# Patient Record
Sex: Female | Born: 1955 | Race: Black or African American | Hispanic: No | Marital: Married | Smoking: Former smoker
Health system: Southern US, Community
[De-identification: ages and names within clinical notes are randomized; demographics above are authoritative.]

## PROBLEM LIST (undated history)

## (undated) DIAGNOSIS — F4321 Adjustment disorder with depressed mood: Secondary | ICD-10-CM

## (undated) DIAGNOSIS — T8484XA Pain due to internal orthopedic prosthetic devices, implants and grafts, initial encounter: Secondary | ICD-10-CM

## (undated) DIAGNOSIS — J309 Allergic rhinitis, unspecified: Secondary | ICD-10-CM

## (undated) DIAGNOSIS — I10 Essential (primary) hypertension: Secondary | ICD-10-CM

## (undated) DIAGNOSIS — Z87442 Personal history of urinary calculi: Secondary | ICD-10-CM

## (undated) DIAGNOSIS — J189 Pneumonia, unspecified organism: Secondary | ICD-10-CM

## (undated) DIAGNOSIS — E785 Hyperlipidemia, unspecified: Secondary | ICD-10-CM

## (undated) DIAGNOSIS — M1990 Unspecified osteoarthritis, unspecified site: Secondary | ICD-10-CM

## (undated) DIAGNOSIS — R51 Headache: Secondary | ICD-10-CM

## (undated) DIAGNOSIS — K219 Gastro-esophageal reflux disease without esophagitis: Secondary | ICD-10-CM

## (undated) DIAGNOSIS — G4700 Insomnia, unspecified: Secondary | ICD-10-CM

## (undated) DIAGNOSIS — F419 Anxiety disorder, unspecified: Secondary | ICD-10-CM

## (undated) DIAGNOSIS — Z96659 Presence of unspecified artificial knee joint: Secondary | ICD-10-CM

## (undated) DIAGNOSIS — R519 Headache, unspecified: Secondary | ICD-10-CM

## (undated) HISTORY — DX: Allergic rhinitis, unspecified: J30.9

## (undated) HISTORY — DX: Personal history of urinary calculi: Z87.442

## (undated) HISTORY — DX: Insomnia, unspecified: G47.00

## (undated) HISTORY — DX: Presence of unspecified artificial knee joint: Z96.659

## (undated) HISTORY — PX: KNEE ARTHROSCOPY: SUR90

## (undated) HISTORY — DX: Essential (primary) hypertension: I10

## (undated) HISTORY — PX: CERVICAL DISC SURGERY: SHX588

## (undated) HISTORY — DX: Hyperlipidemia, unspecified: E78.5

## (undated) HISTORY — PX: OTHER SURGICAL HISTORY: SHX169

## (undated) HISTORY — DX: Adjustment disorder with depressed mood: F43.21

## (undated) HISTORY — DX: Pain due to internal orthopedic prosthetic devices, implants and grafts, initial encounter: T84.84XA

---

## 1998-07-22 ENCOUNTER — Other Ambulatory Visit: Admission: RE | Admit: 1998-07-22 | Discharge: 1998-07-22 | Payer: Self-pay | Admitting: Obstetrics and Gynecology

## 1999-08-30 ENCOUNTER — Ambulatory Visit (HOSPITAL_COMMUNITY): Admission: RE | Admit: 1999-08-30 | Discharge: 1999-08-30 | Payer: Self-pay | Admitting: Family Medicine

## 1999-08-30 ENCOUNTER — Encounter: Payer: Self-pay | Admitting: Family Medicine

## 2000-07-16 ENCOUNTER — Encounter: Payer: Self-pay | Admitting: Emergency Medicine

## 2000-07-16 ENCOUNTER — Emergency Department (HOSPITAL_COMMUNITY): Admission: EM | Admit: 2000-07-16 | Discharge: 2000-07-16 | Payer: Self-pay | Admitting: Emergency Medicine

## 2000-07-19 ENCOUNTER — Encounter: Admission: RE | Admit: 2000-07-19 | Discharge: 2000-07-19 | Payer: Self-pay | Admitting: *Deleted

## 2000-07-19 ENCOUNTER — Encounter: Payer: Self-pay | Admitting: *Deleted

## 2000-07-20 ENCOUNTER — Encounter: Admission: RE | Admit: 2000-07-20 | Discharge: 2000-07-20 | Payer: Self-pay | Admitting: Orthopedic Surgery

## 2000-07-20 ENCOUNTER — Encounter: Payer: Self-pay | Admitting: Orthopedic Surgery

## 2000-08-09 ENCOUNTER — Encounter (INDEPENDENT_AMBULATORY_CARE_PROVIDER_SITE_OTHER): Payer: Self-pay

## 2000-08-09 ENCOUNTER — Other Ambulatory Visit: Admission: RE | Admit: 2000-08-09 | Discharge: 2000-08-09 | Payer: Self-pay | Admitting: *Deleted

## 2000-08-31 ENCOUNTER — Other Ambulatory Visit: Admission: RE | Admit: 2000-08-31 | Discharge: 2000-08-31 | Payer: Self-pay | Admitting: *Deleted

## 2000-08-31 ENCOUNTER — Inpatient Hospital Stay (HOSPITAL_COMMUNITY): Admission: EM | Admit: 2000-08-31 | Discharge: 2000-09-03 | Payer: Self-pay | Admitting: Emergency Medicine

## 2000-08-31 ENCOUNTER — Encounter: Payer: Self-pay | Admitting: Emergency Medicine

## 2000-09-17 ENCOUNTER — Other Ambulatory Visit: Admission: RE | Admit: 2000-09-17 | Discharge: 2000-09-17 | Payer: Self-pay | Admitting: *Deleted

## 2000-09-17 ENCOUNTER — Encounter (INDEPENDENT_AMBULATORY_CARE_PROVIDER_SITE_OTHER): Payer: Self-pay | Admitting: Specialist

## 2002-01-01 ENCOUNTER — Encounter: Payer: Self-pay | Admitting: *Deleted

## 2002-01-01 ENCOUNTER — Ambulatory Visit (HOSPITAL_COMMUNITY): Admission: RE | Admit: 2002-01-01 | Discharge: 2002-01-01 | Payer: Self-pay | Admitting: *Deleted

## 2002-02-27 ENCOUNTER — Encounter: Payer: Self-pay | Admitting: Neurosurgery

## 2002-02-27 ENCOUNTER — Ambulatory Visit (HOSPITAL_COMMUNITY): Admission: RE | Admit: 2002-02-27 | Discharge: 2002-02-28 | Payer: Self-pay | Admitting: Neurosurgery

## 2002-03-31 ENCOUNTER — Encounter: Payer: Self-pay | Admitting: Neurosurgery

## 2002-03-31 ENCOUNTER — Encounter: Admission: RE | Admit: 2002-03-31 | Discharge: 2002-03-31 | Payer: Self-pay | Admitting: Neurosurgery

## 2002-04-14 ENCOUNTER — Encounter: Payer: Self-pay | Admitting: Neurosurgery

## 2002-04-14 ENCOUNTER — Encounter: Admission: RE | Admit: 2002-04-14 | Discharge: 2002-04-14 | Payer: Self-pay | Admitting: Neurosurgery

## 2002-05-07 ENCOUNTER — Encounter: Admission: RE | Admit: 2002-05-07 | Discharge: 2002-05-07 | Payer: Self-pay | Admitting: Internal Medicine

## 2002-05-07 ENCOUNTER — Encounter: Payer: Self-pay | Admitting: Internal Medicine

## 2002-05-12 ENCOUNTER — Encounter: Payer: Self-pay | Admitting: Neurosurgery

## 2002-05-12 ENCOUNTER — Encounter: Admission: RE | Admit: 2002-05-12 | Discharge: 2002-05-12 | Payer: Self-pay | Admitting: Neurosurgery

## 2002-08-04 ENCOUNTER — Encounter: Admission: RE | Admit: 2002-08-04 | Discharge: 2002-08-04 | Payer: Self-pay | Admitting: Neurosurgery

## 2002-08-04 ENCOUNTER — Encounter: Payer: Self-pay | Admitting: Neurosurgery

## 2004-01-08 ENCOUNTER — Ambulatory Visit (HOSPITAL_BASED_OUTPATIENT_CLINIC_OR_DEPARTMENT_OTHER): Admission: RE | Admit: 2004-01-08 | Discharge: 2004-01-08 | Payer: Self-pay | Admitting: Internal Medicine

## 2004-01-08 ENCOUNTER — Ambulatory Visit: Payer: Self-pay | Admitting: Pulmonary Disease

## 2004-12-02 ENCOUNTER — Ambulatory Visit: Payer: Self-pay | Admitting: Internal Medicine

## 2004-12-07 ENCOUNTER — Ambulatory Visit: Payer: Self-pay

## 2004-12-16 ENCOUNTER — Ambulatory Visit: Payer: Self-pay

## 2005-02-04 ENCOUNTER — Emergency Department (HOSPITAL_COMMUNITY): Admission: EM | Admit: 2005-02-04 | Discharge: 2005-02-04 | Payer: Self-pay | Admitting: Emergency Medicine

## 2005-02-16 ENCOUNTER — Ambulatory Visit: Payer: Self-pay | Admitting: Internal Medicine

## 2005-03-08 ENCOUNTER — Encounter: Admission: RE | Admit: 2005-03-08 | Discharge: 2005-03-08 | Payer: Self-pay | Admitting: Occupational Medicine

## 2005-03-22 ENCOUNTER — Encounter: Admission: RE | Admit: 2005-03-22 | Discharge: 2005-04-03 | Payer: Self-pay | Admitting: Occupational Medicine

## 2005-04-10 ENCOUNTER — Ambulatory Visit: Payer: Self-pay | Admitting: Internal Medicine

## 2005-05-21 ENCOUNTER — Ambulatory Visit (HOSPITAL_COMMUNITY): Admission: RE | Admit: 2005-05-21 | Discharge: 2005-05-21 | Payer: Self-pay | Admitting: Neurosurgery

## 2005-06-19 ENCOUNTER — Ambulatory Visit (HOSPITAL_COMMUNITY): Admission: RE | Admit: 2005-06-19 | Discharge: 2005-06-20 | Payer: Self-pay | Admitting: Neurosurgery

## 2006-01-24 ENCOUNTER — Ambulatory Visit: Payer: Self-pay | Admitting: Internal Medicine

## 2006-02-02 ENCOUNTER — Encounter: Admission: RE | Admit: 2006-02-02 | Discharge: 2006-02-02 | Payer: Self-pay | Admitting: Internal Medicine

## 2006-11-09 ENCOUNTER — Ambulatory Visit: Payer: Self-pay | Admitting: Internal Medicine

## 2006-11-24 ENCOUNTER — Emergency Department (HOSPITAL_COMMUNITY): Admission: EM | Admit: 2006-11-24 | Discharge: 2006-11-24 | Payer: Self-pay | Admitting: Emergency Medicine

## 2006-12-18 ENCOUNTER — Encounter: Payer: Self-pay | Admitting: *Deleted

## 2006-12-18 DIAGNOSIS — Z87442 Personal history of urinary calculi: Secondary | ICD-10-CM

## 2006-12-18 DIAGNOSIS — E785 Hyperlipidemia, unspecified: Secondary | ICD-10-CM

## 2006-12-18 DIAGNOSIS — I10 Essential (primary) hypertension: Secondary | ICD-10-CM

## 2006-12-18 DIAGNOSIS — J309 Allergic rhinitis, unspecified: Secondary | ICD-10-CM | POA: Insufficient documentation

## 2006-12-18 HISTORY — DX: Hyperlipidemia, unspecified: E78.5

## 2006-12-18 HISTORY — DX: Allergic rhinitis, unspecified: J30.9

## 2006-12-18 HISTORY — DX: Personal history of urinary calculi: Z87.442

## 2006-12-18 HISTORY — DX: Essential (primary) hypertension: I10

## 2006-12-19 ENCOUNTER — Ambulatory Visit: Payer: Self-pay | Admitting: Internal Medicine

## 2006-12-19 LAB — CONVERTED CEMR LAB
AST: 19 units/L (ref 0–37)
Albumin: 3.8 g/dL (ref 3.5–5.2)
Alkaline Phosphatase: 56 units/L (ref 39–117)
Basophils Absolute: 0.1 10*3/uL (ref 0.0–0.1)
Basophils Relative: 0.7 % (ref 0.0–1.0)
Bilirubin Urine: NEGATIVE
Bilirubin, Direct: 0.1 mg/dL (ref 0.0–0.3)
CO2: 28 meq/L (ref 19–32)
Chloride: 106 meq/L (ref 96–112)
Cholesterol: 210 mg/dL (ref 0–200)
Creatinine, Ser: 0.7 mg/dL (ref 0.4–1.2)
Crystals: NEGATIVE
Direct LDL: 133.1 mg/dL
Eosinophils Relative: 3.2 % (ref 0.0–5.0)
Glucose, Bld: 101 mg/dL — ABNORMAL HIGH (ref 70–99)
HDL: 57.1 mg/dL (ref >39.0)
Ketones, ur: NEGATIVE mg/dL
Lymphocytes Relative: 18.1 % (ref 12.0–46.0)
MCHC: 33.4 g/dL (ref 30.0–36.0)
MCV: 82.7 fL (ref 78.0–100.0)
Neutro Abs: 5.2 10*3/uL (ref 1.4–7.7)
Neutrophils Relative %: 70.5 % (ref 43.0–77.0)
Platelets: 225 10*3/uL (ref 150–400)
RBC: 4.23 M/uL (ref 3.87–5.11)
RDW: 11.9 % (ref 11.5–14.6)
Total Bilirubin: 0.5 mg/dL (ref 0.3–1.2)
Total CHOL/HDL Ratio: 3.7
Total Protein: 7.3 g/dL (ref 6.0–8.3)
Urine Glucose: NEGATIVE mg/dL
VLDL: 19 mg/dL (ref 0–40)
pH: 6 (ref 5.0–8.0)

## 2007-01-29 ENCOUNTER — Emergency Department (HOSPITAL_COMMUNITY): Admission: EM | Admit: 2007-01-29 | Discharge: 2007-01-29 | Payer: Self-pay | Admitting: Emergency Medicine

## 2007-01-30 ENCOUNTER — Ambulatory Visit: Payer: Self-pay | Admitting: Gastroenterology

## 2007-02-04 ENCOUNTER — Telehealth: Payer: Self-pay | Admitting: Internal Medicine

## 2007-02-11 ENCOUNTER — Ambulatory Visit: Payer: Self-pay | Admitting: Internal Medicine

## 2007-02-11 DIAGNOSIS — J40 Bronchitis, not specified as acute or chronic: Secondary | ICD-10-CM

## 2007-02-13 ENCOUNTER — Encounter: Payer: Self-pay | Admitting: Internal Medicine

## 2007-02-13 ENCOUNTER — Encounter: Payer: Self-pay | Admitting: Gastroenterology

## 2007-02-13 ENCOUNTER — Ambulatory Visit: Payer: Self-pay | Admitting: Gastroenterology

## 2007-02-18 ENCOUNTER — Telehealth: Payer: Self-pay | Admitting: Internal Medicine

## 2007-03-15 ENCOUNTER — Ambulatory Visit: Payer: Self-pay | Admitting: Internal Medicine

## 2007-03-15 DIAGNOSIS — F4321 Adjustment disorder with depressed mood: Secondary | ICD-10-CM

## 2007-03-15 DIAGNOSIS — F329 Major depressive disorder, single episode, unspecified: Secondary | ICD-10-CM

## 2007-03-15 HISTORY — DX: Adjustment disorder with depressed mood: F43.21

## 2007-03-27 ENCOUNTER — Telehealth: Payer: Self-pay | Admitting: Internal Medicine

## 2007-04-01 ENCOUNTER — Ambulatory Visit: Payer: Self-pay | Admitting: Internal Medicine

## 2007-07-30 ENCOUNTER — Encounter: Payer: Self-pay | Admitting: Internal Medicine

## 2007-08-14 ENCOUNTER — Ambulatory Visit: Payer: Self-pay | Admitting: Internal Medicine

## 2007-08-14 DIAGNOSIS — M542 Cervicalgia: Secondary | ICD-10-CM

## 2007-09-27 ENCOUNTER — Ambulatory Visit: Payer: Self-pay | Admitting: Internal Medicine

## 2007-09-27 DIAGNOSIS — R519 Headache, unspecified: Secondary | ICD-10-CM | POA: Insufficient documentation

## 2007-09-27 DIAGNOSIS — R51 Headache: Secondary | ICD-10-CM

## 2007-09-27 DIAGNOSIS — J019 Acute sinusitis, unspecified: Secondary | ICD-10-CM | POA: Insufficient documentation

## 2007-09-27 DIAGNOSIS — E860 Dehydration: Secondary | ICD-10-CM

## 2007-09-30 ENCOUNTER — Telehealth (INDEPENDENT_AMBULATORY_CARE_PROVIDER_SITE_OTHER): Payer: Self-pay | Admitting: *Deleted

## 2007-10-18 ENCOUNTER — Telehealth: Payer: Self-pay | Admitting: Internal Medicine

## 2007-12-05 ENCOUNTER — Telehealth (INDEPENDENT_AMBULATORY_CARE_PROVIDER_SITE_OTHER): Payer: Self-pay | Admitting: *Deleted

## 2008-01-20 ENCOUNTER — Telehealth (INDEPENDENT_AMBULATORY_CARE_PROVIDER_SITE_OTHER): Payer: Self-pay | Admitting: *Deleted

## 2008-01-21 ENCOUNTER — Ambulatory Visit: Payer: Self-pay | Admitting: Internal Medicine

## 2008-02-20 ENCOUNTER — Telehealth (INDEPENDENT_AMBULATORY_CARE_PROVIDER_SITE_OTHER): Payer: Self-pay | Admitting: *Deleted

## 2008-04-23 ENCOUNTER — Telehealth (INDEPENDENT_AMBULATORY_CARE_PROVIDER_SITE_OTHER): Payer: Self-pay | Admitting: *Deleted

## 2008-05-12 ENCOUNTER — Telehealth (INDEPENDENT_AMBULATORY_CARE_PROVIDER_SITE_OTHER): Payer: Self-pay | Admitting: *Deleted

## 2008-06-01 ENCOUNTER — Telehealth (INDEPENDENT_AMBULATORY_CARE_PROVIDER_SITE_OTHER): Payer: Self-pay | Admitting: *Deleted

## 2008-06-15 ENCOUNTER — Telehealth (INDEPENDENT_AMBULATORY_CARE_PROVIDER_SITE_OTHER): Payer: Self-pay | Admitting: *Deleted

## 2008-06-29 ENCOUNTER — Telehealth: Payer: Self-pay | Admitting: Internal Medicine

## 2008-06-30 ENCOUNTER — Inpatient Hospital Stay (HOSPITAL_COMMUNITY): Admission: RE | Admit: 2008-06-30 | Discharge: 2008-07-04 | Payer: Self-pay | Admitting: Orthopedic Surgery

## 2008-07-06 ENCOUNTER — Telehealth: Payer: Self-pay | Admitting: Internal Medicine

## 2008-07-10 ENCOUNTER — Telehealth (INDEPENDENT_AMBULATORY_CARE_PROVIDER_SITE_OTHER): Payer: Self-pay | Admitting: *Deleted

## 2008-07-30 ENCOUNTER — Ambulatory Visit: Payer: Self-pay | Admitting: Internal Medicine

## 2008-08-20 ENCOUNTER — Encounter: Admission: RE | Admit: 2008-08-20 | Discharge: 2008-08-20 | Payer: Self-pay | Admitting: Anesthesiology

## 2008-08-20 ENCOUNTER — Emergency Department (HOSPITAL_COMMUNITY): Admission: EM | Admit: 2008-08-20 | Discharge: 2008-08-20 | Payer: Self-pay | Admitting: Emergency Medicine

## 2008-12-17 ENCOUNTER — Encounter
Admission: RE | Admit: 2008-12-17 | Discharge: 2009-02-03 | Payer: Self-pay | Admitting: Physical Medicine & Rehabilitation

## 2008-12-21 ENCOUNTER — Ambulatory Visit: Payer: Self-pay | Admitting: Physical Medicine & Rehabilitation

## 2009-01-04 ENCOUNTER — Ambulatory Visit: Payer: Self-pay | Admitting: Physical Medicine & Rehabilitation

## 2009-01-18 ENCOUNTER — Ambulatory Visit: Payer: Self-pay | Admitting: Physical Medicine & Rehabilitation

## 2009-02-02 ENCOUNTER — Ambulatory Visit: Payer: Self-pay | Admitting: Physical Medicine & Rehabilitation

## 2009-04-05 ENCOUNTER — Encounter
Admission: RE | Admit: 2009-04-05 | Discharge: 2009-06-07 | Payer: Self-pay | Admitting: Physical Medicine & Rehabilitation

## 2009-04-06 ENCOUNTER — Ambulatory Visit: Payer: Self-pay | Admitting: Physical Medicine & Rehabilitation

## 2009-05-07 ENCOUNTER — Ambulatory Visit: Payer: Self-pay | Admitting: Physical Medicine & Rehabilitation

## 2009-06-07 ENCOUNTER — Ambulatory Visit: Payer: Self-pay | Admitting: Physical Medicine & Rehabilitation

## 2009-08-25 ENCOUNTER — Ambulatory Visit: Payer: Self-pay | Admitting: Internal Medicine

## 2009-08-25 DIAGNOSIS — R5383 Other fatigue: Secondary | ICD-10-CM

## 2009-08-25 DIAGNOSIS — M76899 Other specified enthesopathies of unspecified lower limb, excluding foot: Secondary | ICD-10-CM | POA: Insufficient documentation

## 2009-08-25 DIAGNOSIS — G4700 Insomnia, unspecified: Secondary | ICD-10-CM | POA: Insufficient documentation

## 2009-08-25 DIAGNOSIS — R5381 Other malaise: Secondary | ICD-10-CM | POA: Insufficient documentation

## 2009-08-25 HISTORY — DX: Insomnia, unspecified: G47.00

## 2009-08-25 LAB — CONVERTED CEMR LAB
BUN: 19 mg/dL (ref 6–23)
Basophils Absolute: 0 10*3/uL (ref 0.0–0.1)
CO2: 34 meq/L — ABNORMAL HIGH (ref 19–32)
Calcium: 10.4 mg/dL (ref 8.4–10.5)
Glucose, Bld: 110 mg/dL — ABNORMAL HIGH (ref 70–99)
HCT: 38.5 % (ref 36.0–46.0)
Hemoglobin: 12.9 g/dL (ref 12.0–15.0)
Lymphs Abs: 1.6 10*3/uL (ref 0.7–4.0)
MCHC: 33.6 g/dL (ref 30.0–36.0)
MCV: 82.5 fL (ref 78.0–100.0)
Monocytes Absolute: 0.5 10*3/uL (ref 0.1–1.0)
Monocytes Relative: 6.7 % (ref 3.0–12.0)
Neutrophils Relative %: 67.4 % (ref 43.0–77.0)
RDW: 12.9 % (ref 11.5–14.6)
Sodium: 143 meq/L (ref 135–145)

## 2009-09-13 ENCOUNTER — Emergency Department (HOSPITAL_COMMUNITY): Admission: EM | Admit: 2009-09-13 | Discharge: 2009-09-14 | Payer: Self-pay | Admitting: Emergency Medicine

## 2009-09-21 ENCOUNTER — Telehealth: Payer: Self-pay | Admitting: Internal Medicine

## 2009-09-22 ENCOUNTER — Ambulatory Visit: Payer: Self-pay | Admitting: Internal Medicine

## 2009-09-22 DIAGNOSIS — R197 Diarrhea, unspecified: Secondary | ICD-10-CM | POA: Insufficient documentation

## 2009-09-22 DIAGNOSIS — R1084 Generalized abdominal pain: Secondary | ICD-10-CM | POA: Insufficient documentation

## 2009-09-22 LAB — CONVERTED CEMR LAB
Basophils Absolute: 0.1 10*3/uL (ref 0.0–0.1)
Basophils Relative: 0.8 % (ref 0.0–3.0)
CO2: 29 meq/L (ref 19–32)
Creatinine, Ser: 1.2 mg/dL (ref 0.4–1.2)
Eosinophils Absolute: 1.3 10*3/uL — ABNORMAL HIGH (ref 0.0–0.7)
HCT: 36.2 % (ref 36.0–46.0)
Hemoglobin: 12.1 g/dL (ref 12.0–15.0)
Lymphs Abs: 1.4 10*3/uL (ref 0.7–4.0)
Monocytes Absolute: 0.4 10*3/uL (ref 0.1–1.0)
Monocytes Relative: 6.3 % (ref 3.0–12.0)
Potassium: 3.9 meq/L (ref 3.5–5.1)
RDW: 12.6 % (ref 11.5–14.6)
Sodium: 140 meq/L (ref 135–145)

## 2009-09-27 ENCOUNTER — Encounter: Payer: Self-pay | Admitting: Internal Medicine

## 2010-01-04 ENCOUNTER — Telehealth (INDEPENDENT_AMBULATORY_CARE_PROVIDER_SITE_OTHER): Payer: Self-pay | Admitting: *Deleted

## 2010-01-26 ENCOUNTER — Telehealth (INDEPENDENT_AMBULATORY_CARE_PROVIDER_SITE_OTHER): Payer: Self-pay | Admitting: *Deleted

## 2010-04-03 ENCOUNTER — Encounter: Payer: Self-pay | Admitting: Internal Medicine

## 2010-04-03 ENCOUNTER — Encounter: Payer: Self-pay | Admitting: *Deleted

## 2010-04-08 ENCOUNTER — Encounter: Payer: Self-pay | Admitting: Internal Medicine

## 2010-04-14 NOTE — Letter (Signed)
Summary: Out of Work  [facility]  [address] [place], [place] [address]   Phone: [phone]  Fax: [phone]    February 11, 2007   Employee:  Pamela [name]    To Whom It May Concern:   For Medical reasons, please excuse the above named employee from work for the following dates:  Start:    End:    If you need additional information, please feel free to contact our office.         Sincerely,    D Thomos Lemons DO

## 2010-04-14 NOTE — Assessment & Plan Note (Signed)
Summary: STOMACH PAIN/ PER TRIAGE /NWS   Vital Signs:  Patient profile:   55 year old female Height:      68 inches Weight:      229 pounds BMI:     34.95 O2 Sat:      98 % on Room air Temp:     99.1 degrees F oral Pulse rate:   90 / minute BP sitting:   118 / 72  (left arm) Cuff size:   large  Vitals Entered By: Zella Ball Ewing CMA (AAMA) (September 22, 2009 11:04 AM)  O2 Flow:  Room air CC: Stomach problems/RE   CC:  Stomach problems/RE.  History of Present Illness: here for f/u Er visit with abd pain, CT neg, tx with percocet and phenergan with routine labs o/w essentially neg (except slight elev BS , BP and possible midl anemia with hgb approx 11.5.  Since then symtpoms actually just thereafter with increaes pain, gas - like per pt; with increased nausea, vomiting several times, and mult loose stools/ watery and loose, some "dark" but no BRBPR.  Tagament and the percocet seemed to help but now out of meds.  Denies Pt denies CP, sob, doe, wheezing, orthopnea, pnd, worsening LE edema, palps, dizziness or syncope Pt denies new neuro symptoms such as headache, facial or extremity weakness Has had several chills and more foul smelling stool than usual.  Denies worsening polyuria, polydipsia, but has general weakness but no falls or injury.  No rash or joint pains, wt loss, night sweats, or other constitutional symptoms   Problems Prior to Update: 1)  Abdominal Pain, Generalized  (ICD-789.07) 2)  Diarrhea of Presumed Infectious Origin  (ICD-009.3) 3)  Bursitis, Right Hip  (ICD-726.5) 4)  Fatigue  (ICD-780.79) 5)  Insomnia-sleep Disorder-unspec  (ICD-780.52) 6)  Cervicalgia  (ICD-723.1) 7)  Headache  (ICD-784.0) 8)  Dehydration  (ICD-276.51) 9)  Sinusitis- Acute-nos  (ICD-461.9) 10)  Neck Pain  (ICD-723.1) 11)  Depression, Situational  (ICD-309.0) 12)  Bronchitis  (ICD-490) 13)  Cervical Discectomy  () 14)  Renal Calculus, Hx of  (ICD-V13.01) 15)  Hx of Hyperlipidemia  (ICD-272.4) 16)   Allergic Rhinitis  (ICD-477.9) 17)  Hypertension  (ICD-401.9) 18)  Family History of Cad Female 1st Degree Relative <50  (ICD-V17.3)  Medications Prior to Update: 1)  Lisinopril-Hydrochlorothiazide 20-12.5 Mg Tabs (Lisinopril-Hydrochlorothiazide) .Marland Kitchen.. 1po Once Daily 2)  Zyrtec Allergy 10 Mg  Tabs (Cetirizine Hcl) .... Take 1 Tablet By Mouth Once A Day 3)  Zolpidem Tartrate 12.5 Mg Cr-Tabs (Zolpidem Tartrate) .Marland Kitchen.. 1 By Mouth At Bedtime As Needed 4)  Cymbalta 60 Mg Cpep (Duloxetine Hcl) .Marland Kitchen.. 1 By Mouth Once Daily 5)  Flexeril 5 Mg Tabs (Cyclobenzaprine Hcl) .Marland Kitchen.. 1 By Mouth Three Times A Day As Needed Pain 6)  Fentanyl 50 Mcg/hr Pt72 (Fentanyl) .... Apply 1 Patch To Equal 75mg  Once Every 3 Days 7)  Fentanyl 25 Mcg/hr Pt72 (Fentanyl) .... Apply 1 Patch To Equal 75mg  Once Every 3 Days 8)  Vitamin D 34742 Unit Caps (Ergocalciferol) .Marland Kitchen.. 1 By Mouth Once Daily 9)  Prednisone 20 Mg Tabs (Prednisone) .Marland Kitchen.. 1po Once Daily For 7 Days  Current Medications (verified): 1)  Lisinopril-Hydrochlorothiazide 20-12.5 Mg Tabs (Lisinopril-Hydrochlorothiazide) .Marland Kitchen.. 1po Once Daily 2)  Zyrtec Allergy 10 Mg  Tabs (Cetirizine Hcl) .... Take 1 Tablet By Mouth Once A Day 3)  Zolpidem Tartrate 12.5 Mg Cr-Tabs (Zolpidem Tartrate) .Marland Kitchen.. 1 By Mouth At Bedtime As Needed 4)  Cymbalta 60 Mg Cpep (Duloxetine Hcl) .Marland KitchenMarland KitchenMarland Kitchen 1  By Mouth Once Daily 5)  Flexeril 5 Mg Tabs (Cyclobenzaprine Hcl) .Marland Kitchen.. 1 By Mouth Three Times A Day As Needed Pain 6)  Fentanyl 50 Mcg/hr Pt72 (Fentanyl) .... Apply 1 Patch To Equal 75mg  Once Every 3 Days 7)  Fentanyl 25 Mcg/hr Pt72 (Fentanyl) .... Apply 1 Patch To Equal 75mg  Once Every 3 Days 8)  Vitamin D 04540 Unit Caps (Ergocalciferol) .Marland Kitchen.. 1 By Mouth Once Daily 9)  Oxycodone Hcl 5 Mg Tabs (Oxycodone Hcl) .Marland Kitchen.. 1 - 2 By Mouth Q 6 Hrs As Needed Pain 10)  Promethazine Hcl 25 Mg Tabs (Promethazine Hcl) .Marland Kitchen.. 1po Q 6 Hrs As Needed Nausea  Allergies (verified): No Known Drug Allergies  Past History:  Past Medical  History: Last updated: 09/30/2007 History of kidney stones. 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 , mild ill  Head:  normocephalic and atraumatic.   Eyes:  vision grossly intact, pupils equal, and pupils round.   Ears:  bilat tm's mild red, sinus nontender Nose:  nasal dischargemucosal pallor and mucosal edema.   Mouth:  pharyngeal erythema and fair dentition.   Neck:  supple and no masses.   Lungs:  normal respiratory effort and normal breath sounds.   Heart:  normal rate and regular rhythm.   Abdomen:  soft and normal bowel sounds with diffuse mild tender, no guarding or rebound.   Extremities:  no edema, no erythema    Impression & Recommendations:  Problem # 1:  DIARRHEA OF PRESUMED INFECTIOUS ORIGIN (ICD-009.3)  ? viral vs c diff vs other - for labs and stool studies, refil percocet/phenergan  Orders: T-Culture, C-Diff Toxin A/B (98119-14782) T-Stool for O&P (95621-30865) T-Stool Giardia / Crypto- EIA (78469) T-Culture, Stool (87045/[id number])  Problem # 2:  ABDOMINAL PAIN, GENERALIZED (ICD-789.07)  recent ct neg;  most likely related to above  Orders: TLB-BMP (Basic Metabolic Panel-BMET) (80048-METABOL) TLB-CBC Platelet - w/Differential (85025-CBCD) TLB-Lipase (83690-LIPASE)  Problem # 3:  HYPERTENSION (ICD-401.9)  Her updated medication list for this problem includes:     Lisinopril-hydrochlorothiazide 20-12.5 Mg Tabs (Lisinopril-hydrochlorothiazide) .Marland Kitchen... 1po once daily  BP today: 118/72 Prior BP: 144/94 (08/25/2009)  Labs Reviewed: K+: 3.7 (08/25/2009) Creat: : 0.8 (08/25/2009)   Chol: 210 (12/19/2006)   HDL: 57.1 (12/19/2006)   LDL: DEL (12/19/2006)   TG: 95 (12/19/2006) stable overall by hx and exam, ok to continue meds/tx as is   Complete Medication List: 1)  Lisinopril-hydrochlorothiazide 20-12.5 Mg Tabs (Lisinopril-hydrochlorothiazide) .Marland Kitchen.. 1po once daily 2)  Zyrtec Allergy 10 Mg Tabs (Cetirizine hcl) .... Take 1 tablet by mouth once a day 3)  Zolpidem Tartrate 12.5 Mg Cr-tabs (Zolpidem tartrate) .Marland Kitchen.. 1 by mouth at bedtime as needed 4)  Cymbalta 60 Mg Cpep (Duloxetine hcl) .Marland Kitchen.. 1 by mouth once daily 5)  Flexeril 5 Mg Tabs (Cyclobenzaprine hcl) .Marland Kitchen.. 1 by mouth three times a day as needed pain 6)  Fentanyl 50 Mcg/hr Pt72 (Fentanyl) .... Apply 1 patch to equal 75mg  once every  3 days 7)  Fentanyl 25 Mcg/hr Pt72 (Fentanyl) .... Apply 1 patch to equal 75mg  once every 3 days 8)  Vitamin D 16109 Unit Caps (Ergocalciferol) .Marland Kitchen.. 1 by mouth once daily 9)  Oxycodone Hcl 5 Mg Tabs (Oxycodone hcl) .Marland Kitchen.. 1 - 2 by mouth q 6 hrs as needed pain 10)  Promethazine Hcl 25 Mg Tabs (Promethazine hcl) .Marland Kitchen.. 1po q 6 hrs as needed nausea  Patient Instructions: 1)  Please take all new medications as prescribed 2)  Continue all previous medications as before this visit  3)  Please go to the Lab in the basement for your blood and/or stool tests today  4)  Please schedule a follow-up appointment in June 2012 or sooner if needed Prescriptions: PROMETHAZINE HCL 25 MG TABS (PROMETHAZINE HCL) 1po q 6 hrs as needed nausea  #40 x 1   Entered and Authorized by:   Corwin Levins MD   Signed by:   Corwin Levins MD on 09/22/2009   Method used:   Print then Give to Patient   RxID:   [id number] OXYCODONE HCL 5 MG TABS (OXYCODONE HCL) 1 - 2 by mouth q 6 hrs as needed pain  #60 x 0    Entered and Authorized by:   Corwin Levins MD   Signed by:   Corwin Levins MD on 09/22/2009   Method used:   Print then Give to Patient   RxID:   [id number] ZOLPIDEM TARTRATE 12.5 MG CR-TABS (ZOLPIDEM TARTRATE) 1 by mouth at bedtime as needed  #90 x 1   Entered and Authorized by:   Corwin Levins MD   Signed by:   Corwin Levins MD on 09/22/2009   Method used:   Print then Give to Patient   RxID:   [id number] CYMBALTA 60 MG CPEP (DULOXETINE HCL) 1 by mouth once daily  #90 x 3   Entered and Authorized by:   Corwin Levins MD   Signed by:   Corwin Levins MD on 09/22/2009   Method used:   Print then Give to Patient   RxID:   [id number]

## 2010-04-14 NOTE — Progress Notes (Signed)
  Phone Note Other Incoming   Request: Send information Summary of Call: Request received from [employer] forwarded to [employer].       

## 2010-04-14 NOTE — Progress Notes (Signed)
Summary: Percocet  Phone Note Call from Patient Call back at Home Phone [phone]   Caller: Patient Summary of Call: Pt called requesting refill of Percocet recieved at ER. Pt advised that she will need appt with [facility] and was transferred to sch appt. Initial call taken by: Margaret Pyle, CMA,  September 21, 2009 4:58 PM

## 2010-04-14 NOTE — Assessment & Plan Note (Signed)
Summary: hip pain/bp med ck/#/cd   Vital Signs:  Patient profile:   55 year old female Height:      69 inches Weight:      234.25 pounds BMI:     34.72 O2 Sat:      97 % on Room air Temp:     97 degrees F oral Pulse rate:   91 / minute BP sitting:   144 / 94  (left arm) Cuff size:   large  Vitals Entered ByMarland Kitchen Zella Ball Ewing (August 25, 2009 1:52 PM)  O2 Flow:  Room air  CC: Hip pain, headaches, refills/RE   CC:  Hip pain, headaches, and refills/RE.  History of Present Illness: here for yearly f/u - has achy moderate right lateral hip pain with stiffiness and pain with trying to stand and walk for 4 months,  also much worse to lie on the right side;  no recent falls;  no fever, trauma;  wt loss, night sweats.   Out of all meds for several days.  Not working now [employer] comp has been settled, but not yet applied for disability.  Meds are too expensive.    Preventive Screening-Counseling & Management      Drug Use:  no.    Problems Prior to Update: 1)  Cervicalgia  (ICD-723.1) 2)  Headache  (ICD-784.0) 3)  Dehydration  (ICD-276.51) 4)  Sinusitis- Acute-nos  (ICD-461.9) 5)  Neck Pain  (ICD-723.1) 6)  Depression, Situational  (ICD-309.0) 7)  Bronchitis  (ICD-490) 8)  Cervical Discectomy  () 9)  Renal Calculus, Hx of  (ICD-V13.01) 10)  Hx of Hyperlipidemia  (ICD-272.4) 11)  Allergic Rhinitis  (ICD-477.9) 12)  Hypertension  (ICD-401.9) 13)  Family History of Cad Female 1st Degree Relative <50  (ICD-V17.3)  Medications Prior to Update: 1)  Diovan Hct 160-12.5 Mg  Tabs (Valsartan-Hydrochlorothiazide) .... Take 1 Tab By Mouth At Bedtime 2)  Zyrtec Allergy 10 Mg  Tabs (Cetirizine Hcl) .... Take 1 Tablet By Mouth Once A Day 3)  Lunesta 2 Mg  Tabs (Eszopiclone) .Marland Kitchen.. 1 At Bedtime As Needed Sleep 4)  Cymbalta 60 Mg Cpep (Duloxetine Hcl) .Marland Kitchen.. 1 By Mouth Once Daily 5)  Flexeril 5 Mg Tabs (Cyclobenzaprine Hcl) .Marland Kitchen.. 1 By Mouth Three Times A Day As Needed Pain 6)  Fentanyl 50 Mcg/hr Pt72  (Fentanyl) .... Apply 1 Patch To Equal 75mg  Once Every 3 Days 7)  Fentanyl 25 Mcg/hr Pt72 (Fentanyl) .... Apply 1 Patch To Equal 75mg  Once Every 3 Days 8)  Vitamin D 16109 Unit Caps (Ergocalciferol) .Marland Kitchen.. 1 By Mouth Once Daily  Current Medications (verified): 1)  Lisinopril-Hydrochlorothiazide 20-12.5 Mg Tabs (Lisinopril-Hydrochlorothiazide) .Marland Kitchen.. 1po Once Daily 2)  Zyrtec Allergy 10 Mg  Tabs (Cetirizine Hcl) .... Take 1 Tablet By Mouth Once A Day 3)  Zolpidem Tartrate 12.5 Mg Cr-Tabs (Zolpidem Tartrate) .Marland Kitchen.. 1 By Mouth At Bedtime As Needed 4)  Cymbalta 60 Mg Cpep (Duloxetine Hcl) .Marland Kitchen.. 1 By Mouth Once Daily 5)  Flexeril 5 Mg Tabs (Cyclobenzaprine Hcl) .Marland Kitchen.. 1 By Mouth Three Times A Day As Needed Pain 6)  Fentanyl 50 Mcg/hr Pt72 (Fentanyl) .... Apply 1 Patch To Equal 75mg  Once Every 3 Days 7)  Fentanyl 25 Mcg/hr Pt72 (Fentanyl) .... Apply 1 Patch To Equal 75mg  Once Every 3 Days 8)  Vitamin D 60454 Unit Caps (Ergocalciferol) .Marland Kitchen.. 1 By Mouth Once Daily 9)  Prednisone 20 Mg Tabs (Prednisone) .Marland Kitchen.. 1po Once Daily For 7 Days  Allergies (verified): No Known Drug Allergies  Past History:  Family History: Last updated: 10-11-2007 Sister died 03-08-2007 from pneumonia. 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 History of kidney stones. Hyperlipidemia Allergic Rhinitis Hypertension Depression Neck pain glucose intolerance hyperplastic colon polyp 2008  Past Surgical History: * CERVICAL DISCECTOMY x 3  s/p cervical fusion   -    total 3 cervical surguries (1  post, 2 ant approach)  Family History: Reviewed history from 10-11-2007 and no changes required. Sister died Mar 08, 2007 from pneumonia. Family History of CAD Female 1st degree relative <50 Family History of Stroke F 1st degree relative <60 father with MI at 51 yo mother with stroke at 35 yo sister with CAD in her 58's  Social History: Reviewed history from 03/15/2007 and no changes required. 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   Head:  normocephalic and atraumatic.   Eyes:  vision grossly intact, pupils equal, and pupils round.   Ears:  R ear normal and L ear normal.   Nose:  no external deformity and no nasal discharge.   Mouth:  no gingival abnormalities and pharynx pink and moist.   Neck:  supple and no masses.   Lungs:  normal respiratory effort and normal breath sounds.   Heart:  normal rate and regular rhythm.   Abdomen:  soft, non-tender, and normal bowel sounds.   Msk:  no joint tenderness and no joint swelling.   - except right lateral hip tender without sweling or erythema Extremities:  no edema, no erythema  Neurologic:  strength normal in all extremities and sensation intact to light touch.     Impression & Recommendations:  Problem # 1:  HYPERTENSION (ICD-401.9)  Her updated medication list for this problem includes:    Lisinopril-hydrochlorothiazide 20-12.5 Mg Tabs (Lisinopril-hydrochlorothiazide) .Marland Kitchen... 1po once daily pt needs change to less expensive med, f/u any symptoms/side effect such as rash, cough;  to also chekc BP at home as she does.  BP today: 144/94 Prior BP: 130/84 (07/30/2008)  Labs Reviewed: K+: 3.8  (12/19/2006) Creat: : 0.7 (12/19/2006)   Chol: 210 (12/19/2006)   HDL: 57.1 (12/19/2006)   LDL: DEL (12/19/2006)   TG: 95 (12/19/2006)  Problem # 2:  CERVICALGIA (ICD-723.1)  Her updated medication list for this problem includes:    Flexeril 5 Mg Tabs (Cyclobenzaprine hcl) .Marland Kitchen... 1 by mouth three times a day as needed pain    Fentanyl 50 Mcg/hr Pt72 (Fentanyl) .Marland Kitchen... Apply 1 patch to equal 75mg  once every 3 days    Fentanyl 25 Mcg/hr Pt72 (Fentanyl) .Marland Kitchen... Apply 1 patch to equal 75mg  once every 3 days improved, appylying for disability  Problem # 3:  INSOMNIA-SLEEP DISORDER-UNSPEC (ICD-780.52)  Her updated medication list for  this problem includes:    Zolpidem Tartrate 12.5 Mg Cr-tabs (Zolpidem tartrate) .Marland Kitchen... 1 by mouth at bedtime as needed change to generic, hopefully not too expensive, benadryl not working in the past  Problem # 4:  FATIGUE (ICD-780.79) exam benign, to check labs below; follow with expectant management  Orders: TLB-BMP (Basic Metabolic Panel-BMET) (80048-METABOL) TLB-CBC Platelet - w/Differential (85025-CBCD) TLB-TSH (Thyroid Stimulating Hormone) (84443-TSH)  Problem # 5:  BURSITIS, RIGHT HIP (ICD-726.5) exam c/w this - for by mouth prednisone, to ortho if persists; and do not lie on right side at night  Complete Medication List: 1)  Lisinopril-hydrochlorothiazide 20-12.5 Mg Tabs (Lisinopril-hydrochlorothiazide) .Marland Kitchen.. 1po once daily 2)  Zyrtec Allergy 10 Mg Tabs (Cetirizine hcl) .... Take 1 tablet by mouth once a day 3)  Zolpidem Tartrate 12.5 Mg Cr-tabs (Zolpidem tartrate) .Marland Kitchen.. 1 by mouth at bedtime as needed 4)  Cymbalta 60 Mg Cpep (Duloxetine hcl) .Marland Kitchen.. 1 by mouth once daily 5)  Flexeril 5 Mg Tabs (Cyclobenzaprine hcl) .Marland Kitchen.. 1 by mouth three times a day as needed pain 6)  Fentanyl 50 Mcg/hr Pt72 (Fentanyl) .... Apply 1 patch to equal 75mg  once every 3 days 7)  Fentanyl 25 Mcg/hr Pt72 (Fentanyl) .... Apply 1 patch to equal 75mg  once every 3 days 8)  Vitamin D 40981  Unit Caps (Ergocalciferol) .Marland Kitchen.. 1 by mouth once daily 9)  Prednisone 20 Mg Tabs (Prednisone) .Marland Kitchen.. 1po once daily for 7 days  Patient Instructions: 1)  please call for yearly mammogram - consider [facility] on [address] 2)  Please take all new medications as prescribed 3)  Continue all previous medications as before this visit , except stop the diovan/hct and lunesta 4)  Please schedule a follow-up appointment in 1 year or sooner if needed Prescriptions: PREDNISONE 20 MG TABS (PREDNISONE) 1po once daily for 7 days  #7 x 0   Entered and Authorized by:   Corwin Levins MD   Signed by:   Corwin Levins MD on 08/25/2009   Method used:   Print then Give to Patient   RxID:   [id number] FLEXERIL 5 MG TABS (CYCLOBENZAPRINE HCL) 1 by mouth three times a day as needed pain  #90 x 1   Entered and Authorized by:   Corwin Levins MD   Signed by:   Corwin Levins MD on 08/25/2009   Method used:   Print then Give to Patient   RxID:   [id number] LISINOPRIL-HYDROCHLOROTHIAZIDE 20-12.5 MG TABS (LISINOPRIL-HYDROCHLOROTHIAZIDE) 1po once daily  #30 x 11   Entered and Authorized by:   Corwin Levins MD   Signed by:   Corwin Levins MD on 08/25/2009   Method used:   Print then Give to Patient   RxID:   [id number] ZOLPIDEM TARTRATE 12.5 MG CR-TABS (ZOLPIDEM TARTRATE) 1 by mouth at bedtime as needed  #30 x 5   Entered and Authorized by:   Corwin Levins MD   Signed by:   Corwin Levins MD on 08/25/2009   Method used:   Print then Give to Patient   RxID:   [id number]

## 2010-04-14 NOTE — Progress Notes (Signed)
  Phone Note Other Incoming   Request: Send information Summary of Call: Request for records received from [employer]. Request forwarded to [employer].     

## 2010-04-28 NOTE — Letter (Signed)
Summary: Report of exams/Disability Determination Services  Report of exams/Disability Determination Services   Imported By: Sherian Rein 04/21/2010 15:39:21  _____________________________________________________________________  External Attachment:    Type:   Image     Comment:   External Document

## 2010-05-18 ENCOUNTER — Ambulatory Visit (INDEPENDENT_AMBULATORY_CARE_PROVIDER_SITE_OTHER): Payer: Self-pay | Admitting: Internal Medicine

## 2010-05-18 ENCOUNTER — Encounter: Payer: Self-pay | Admitting: Internal Medicine

## 2010-05-18 DIAGNOSIS — M542 Cervicalgia: Secondary | ICD-10-CM

## 2010-05-18 DIAGNOSIS — J019 Acute sinusitis, unspecified: Secondary | ICD-10-CM

## 2010-05-18 DIAGNOSIS — I10 Essential (primary) hypertension: Secondary | ICD-10-CM

## 2010-05-24 NOTE — Assessment & Plan Note (Signed)
Summary: COUGH / HIP PAIN / SORE THROAT /NWS   Vital Signs:  Patient profile:   55 year old female Height:      68 inches Weight:      239.50 pounds BMI:     36.55 O2 Sat:      97 % on Room air Temp:     99.3 degrees F oral Pulse rate:   88 / minute BP sitting:   132 / 82  (left arm) Cuff size:   large  Vitals Entered By: Zella Ball Ewing CMA (AAMA) (May 18, 2010 8:26 AM)  O2 Flow:  Room air  CC:  Right Hip and left  knee pain, cough and sore throat/RE   CC:   Right Hip and left  knee pain and cough and sore throat/RE.  History of Present Illness: here to f/u with acuteonset 3 days facial pain, pressure, fever and greenish d/c;  with ST and Pt denies CP, worsening sob, doe, wheezing, orthopnea, pnd, worsening LE edema, palps, dizziness or syncope   Has been coughing off and on for 6 wks , and would like to change the ACE to ARB if possible.   Has not been back to pain clinic under workmans comp but can no longer afford right now.  Not taking the fentnyl; is willing to try the tramadol.   /Pt denies CP, worsening sob, doe, wheezing, orthopnea, pnd, worsening LE edema, palps, dizziness or syncope   Pt denies new neuro symptoms such as headache, facial or extremity weakness  Pt denies polydipsia, polyuria  Problems Prior to Update: 1)  Abdominal Pain, Generalized  (ICD-789.07) 2)  Diarrhea of Presumed Infectious Origin  (ICD-009.3) 3)  Bursitis, Right Hip  (ICD-726.5) 4)  Fatigue  (ICD-780.79) 5)  Insomnia-sleep Disorder-unspec  (ICD-780.52) 6)  Cervicalgia  (ICD-723.1) 7)  Headache  (ICD-784.0) 8)  Dehydration  (ICD-276.51) 9)  Sinusitis- Acute-nos  (ICD-461.9) 10)  Neck Pain  (ICD-723.1) 11)  Depression, Situational  (ICD-309.0) 12)  Bronchitis  (ICD-490) 13)  Cervical Discectomy  () 14)  Renal Calculus, Hx of  (ICD-V13.01) 15)  Hx of Hyperlipidemia  (ICD-272.4) 16)  Allergic Rhinitis  (ICD-477.9) 17)  Hypertension  (ICD-401.9) 18)  Family History of Cad Female 1st Degree  Relative <50  (ICD-V17.3)  Medications Prior to Update: 1)  Lisinopril-Hydrochlorothiazide 20-12.5 Mg Tabs (Lisinopril-Hydrochlorothiazide) .Marland Kitchen.. 1po Once Daily 2)  Zyrtec Allergy 10 Mg  Tabs (Cetirizine Hcl) .... Take 1 Tablet By Mouth Once A Day 3)  Zolpidem Tartrate 12.5 Mg Cr-Tabs (Zolpidem Tartrate) .Marland Kitchen.. 1 By Mouth At Bedtime As Needed 4)  Cymbalta 60 Mg Cpep (Duloxetine Hcl) .Marland Kitchen.. 1 By Mouth Once Daily 5)  Flexeril 5 Mg Tabs (Cyclobenzaprine Hcl) .Marland Kitchen.. 1 By Mouth Three Times A Day As Needed Pain 6)  Fentanyl 50 Mcg/hr Pt72 (Fentanyl) .... Apply 1 Patch To Equal 75mg  Once Every 3 Days 7)  Fentanyl 25 Mcg/hr Pt72 (Fentanyl) .... Apply 1 Patch To Equal 75mg  Once Every 3 Days 8)  Vitamin D [address] Unit Caps (Ergocalciferol) .Marland Kitchen.. 1 By Mouth Once Daily 9)  Oxycodone Hcl 5 Mg Tabs (Oxycodone Hcl) .Marland Kitchen.. 1 - 2 By Mouth Q 6 Hrs As Needed Pain 10)  Promethazine Hcl 25 Mg Tabs (Promethazine Hcl) .Marland Kitchen.. 1po Q 6 Hrs As Needed Nausea  Current Medications (verified): 1)  Losartan Potassium-Hctz 100-25 Mg Tabs (Losartan Potassium-Hctz) .Marland Kitchen.. 1po Once Daily 2)  Zyrtec Allergy 10 Mg  Tabs (Cetirizine Hcl) .... Take 1 Tablet By Mouth Once A Day 3)  Zolpidem Tartrate 12.5 Mg Cr-Tabs (Zolpidem Tartrate) .Marland Kitchen.. 1 By Mouth At Bedtime As Needed 4)  Cymbalta 60 Mg Cpep (Duloxetine Hcl) .Marland Kitchen.. 1 By Mouth Once Daily 5)  Oxycodone Hcl 5 Mg Tabs (Oxycodone Hcl) .Marland Kitchen.. 1 By Mouth Two Times A Day As Needed 6)  Flexeril 5 Mg Tabs (Cyclobenzaprine Hcl) .Marland Kitchen.. 1 By Mouth Three Times A Day As Needed 7)  Tramadol Hcl 300 Mg Xr24h-Tab (Tramadol Hcl) .Marland Kitchen.. 1 By Mouth Once Daily 8)  Doxycycline Hyclate 100 Mg Caps (Doxycycline Hyclate) .Marland Kitchen.. 1 By Mouth Two Times A Day 9)  Tessalon Perles 100 Mg Caps (Benzonatate) .Marland Kitchen.. 1-2 By Mouth Three Times A Day As Needed Cough  Allergies (verified): No Known Drug Allergies  Past History:  Past Medical History: Last updated: 09/30/2007 History of kidney stones. 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  , mild ill  Head:  normocephalic and atraumatic.   Eyes:  vision grossly intact, pupils equal, and pupils round.   Ears:  bilat tms' mild red, sinus tender bilat Nose:  nasal dischargemucosal pallor and mucosal edema.   Mouth:  pharyngeal erythema and fair dentition.   Neck:  supple and no masses.   Lungs:  normal respiratory effort and normal breath sounds.   Heart:  normal rate and regular rhythm.   Abdomen:  soft, non-tender, and normal bowel sounds.   Msk:  no joint tenderness and no joint swelling.   Extremities:  no edema, no erythema    Impression & Recommendations:  Problem # 1:  SINUSITIS- ACUTE-NOS (ICD-461.9)  mild to mod symptoms with nonprod cough and normal chest exam -   Her updated medication list for this problem includes:    Doxycycline Hyclate 100 Mg Caps (Doxycycline hyclate) .Marland Kitchen... 1 by mouth two times a day    Tessalon Perles 100 Mg Caps (Benzonatate) .Marland Kitchen... 1-2 by mouth three times a day as needed cough treat as above, f/u any worsening signs or symptoms   Problem # 2:  NECK PAIN (ICD-723.1)  The following medications were removed from the medication list:    Flexeril 5 Mg Tabs (Cyclobenzaprine hcl) .Marland Kitchen... 1 by mouth three  times a day as needed pain    Fentanyl 50 Mcg/hr Pt72 (Fentanyl) .Marland Kitchen... Apply 1 patch to equal 75mg  once every 3 days    Fentanyl 25 Mcg/hr Pt72 (Fentanyl) .Marland Kitchen... Apply 1 patch to equal 75mg  once every 3 days Her updated medication list for this problem includes:    Oxycodone Hcl 5 Mg Tabs (Oxycodone hcl) .Marland Kitchen... 1 by mouth two times a day as needed    Flexeril 5 Mg Tabs (Cyclobenzaprine hcl) .Marland Kitchen... 1 by mouth three times a day as needed    Tramadol Hcl 300 Mg Xr24h-tab (Tramadol hcl) .Marland Kitchen... 1 by mouth once daily chronic, now approved for disability  but no longer under workmans comp;  to apply for medicaid, treat as above, f/u any worsening signs or symptoms   Problem # 3:  HYPERTENSION (ICD-401.9)  Her updated medication list for this problem includes:    Losartan Potassium-hctz 100-25 Mg Tabs (Losartan potassium-hctz) .Marland Kitchen... 1po once daily  BP today: 132/82 Prior BP: 118/72 (09/22/2009)  Labs Reviewed: K+: 3.9 (09/22/2009) Creat: : 1.2 (09/22/2009)   Chol: 210 (12/19/2006)   HDL: 57.1 (12/19/2006)   LDL: DEL (12/19/2006)   TG: 95 (12/19/2006) okj to treat as above, f/u any worsening signs or symptoms   Complete Medication List: 1)  Losartan Potassium-hctz 100-25 Mg Tabs (Losartan potassium-hctz) .Marland Kitchen.. 1po once daily 2)  Zyrtec Allergy 10 Mg Tabs (Cetirizine hcl) .... Take 1 tablet by mouth once a day 3)  Zolpidem Tartrate 12.5 Mg Cr-tabs (Zolpidem tartrate) .Marland Kitchen.. 1 by mouth at bedtime as needed 4)  Cymbalta 60 Mg Cpep (Duloxetine hcl) .Marland Kitchen.. 1 by mouth once daily 5)  Oxycodone Hcl 5 Mg Tabs (Oxycodone hcl) .Marland Kitchen.. 1 by mouth two times a day as needed 6)  Flexeril 5 Mg Tabs (Cyclobenzaprine hcl) .Marland Kitchen.. 1 by mouth three times a day as needed 7)  Tramadol Hcl 300 Mg Xr24h-tab (Tramadol hcl) .Marland Kitchen.. 1 by mouth once daily 8)  Doxycycline Hyclate 100 Mg Caps (Doxycycline hyclate) .Marland Kitchen.. 1 by mouth two times a day 9)  Tessalon Perles 100 Mg Caps (Benzonatate) .Marland Kitchen.. 1-2 by mouth three times a day as needed  cough  Patient Instructions: 1)  Please take all new medications as prescribed  2)  Continue all previous medications as before this visit  3)  Please go to [employer] [facility] [employer] to apply for Medicaid (no need to talk to attorney for this) 4)  Please schedule a follow-up appointment in 6 months ofr sooner if needed Prescriptions: TESSALON PERLES 100 MG CAPS (BENZONATATE) 1-2 by mouth three times a day as needed cough  #60 x 1   Entered and Authorized by:   Corwin Levins MD   Signed by:   Corwin Levins MD on 05/18/2010   Method used:   Electronically to        [facility] [phone]* (retail)       [address]       [place], [place]  [address]       Ph: [phone]       Fax: [phone]   RxID:   [id number] DOXYCYCLINE HYCLATE 100 MG CAPS (DOXYCYCLINE HYCLATE) 1 by mouth two times a day  #20 x 0   Entered and Authorized by:   Corwin Levins MD   Signed by:   Corwin Levins MD on 05/18/2010   Method used:   Electronically to        [facility] [address] [phone]* (retail)       [address]       [place], [place]  [address]       Ph: [phone]       Fax: [phone]   RxID:   [id number] TRAMADOL HCL 300 MG XR24H-TAB (TRAMADOL HCL) 1 by mouth once daily  #30 x 5   Entered and Authorized by:   Corwin Levins MD   Signed by:   Corwin Levins MD on 05/18/2010   Method used:   Electronically to        [facility]  [address] [phone]* (retail)       [address]  [place], [place]  [address]       Ph: [phone]       Fax: [phone]   RxID:   [id number] TRAMADOL HCL 300 MG XR24H-TAB (TRAMADOL HCL) 1 by mouth once daily  #30 x 5   Entered and Authorized by:   Corwin Levins MD   Signed by:   Corwin Levins MD on 05/18/2010   Method used:   Print then Give to Patient   RxID:   [id number] OXYCODONE HCL 5 MG TABS (OXYCODONE HCL) 1 by mouth two times a day as needed  #60 x 0   Entered and Authorized by:   Corwin Levins MD   Signed by:   Corwin Levins MD on  05/18/2010   Method used:   Print then Give to Patient   RxID:   [id number] FLEXERIL 5 MG TABS (CYCLOBENZAPRINE HCL) 1 by mouth three times a day as needed  #90 x 3   Entered and Authorized by:   Corwin Levins MD   Signed by:   Corwin Levins MD on 05/18/2010   Method used:   Electronically to        [facility] [address] [phone]* (retail)       [address]       [place], [place]  [address]       Ph: [phone]       Fax: [phone]   RxID:   [id number] CYMBALTA 60 MG CPEP (DULOXETINE HCL) 1 by mouth once daily  #30 x 11   Entered and Authorized by:   Corwin Levins MD   Signed by:   Corwin Levins MD on 05/18/2010   Method used:   Electronically to        [facility] [address] [phone]* (retail)       [address]       [place], [place]  [address]       Ph: [phone]       Fax: [phone]   RxID:   [id number] ZOLPIDEM TARTRATE 12.5 MG CR-TABS (ZOLPIDEM TARTRATE) 1 by mouth at bedtime as needed  #90 x 1   Entered and Authorized by:   Corwin Levins MD   Signed by:   Corwin Levins MD on 05/18/2010   Method used:   Print then Give to Patient   RxID:   [id number] LOSARTAN POTASSIUM-HCTZ 100-25 MG TABS (LOSARTAN POTASSIUM-HCTZ) 1po once daily  #30 x 11   Entered and Authorized by:   Corwin Levins MD   Signed by:   Corwin Levins MD on 05/18/2010   Method used:   Electronically to        [facility] [address] [phone]* (retail)       [address]       [place], [place]  [address]       Ph: [phone]       Fax: [phone]   RxID:   [id number]    Orders Added: 1)  Est. Patient Level IV [505[address]]

## 2010-05-29 LAB — URINALYSIS, ROUTINE W REFLEX MICROSCOPIC
Glucose, UA: NEGATIVE mg/dL
Hgb urine dipstick: NEGATIVE
Nitrite: NEGATIVE
Specific Gravity, Urine: 1.018 (ref 1.005–1.030)
Urobilinogen, UA: 0.2 mg/dL (ref 0.0–1.0)

## 2010-05-29 LAB — HEPATIC FUNCTION PANEL
ALT: 23 U/L (ref 0–35)
AST: 27 U/L (ref 0–37)
Indirect Bilirubin: 0.3 mg/dL (ref 0.3–0.9)
Total Bilirubin: 0.4 mg/dL (ref 0.3–1.2)
Total Protein: 7.1 g/dL (ref 6.0–8.3)

## 2010-05-29 LAB — CBC
Hemoglobin: 11.5 g/dL — ABNORMAL LOW (ref 12.0–15.0)
MCH: 27.8 pg (ref 26.0–34.0)
MCHC: 33.3 g/dL (ref 30.0–36.0)
MCV: 83.4 fL (ref 78.0–100.0)
Platelets: 230 10*3/uL (ref 150–400)

## 2010-05-29 LAB — URINE MICROSCOPIC-ADD ON

## 2010-05-29 LAB — DIFFERENTIAL
Basophils Relative: 0 % (ref 0–1)
Lymphocytes Relative: 11 % — ABNORMAL LOW (ref 12–46)
Monocytes Absolute: 0.4 10*3/uL (ref 0.1–1.0)
Monocytes Relative: 4 % (ref 3–12)
Neutrophils Relative %: 85 % — ABNORMAL HIGH (ref 43–77)

## 2010-05-29 LAB — BASIC METABOLIC PANEL
CO2: 26 mEq/L (ref 19–32)
Chloride: 107 mEq/L (ref 96–112)
Creatinine, Ser: 0.85 mg/dL (ref 0.4–1.2)
GFR calc non Af Amer: >60 mL/min (ref >60)
Potassium: 3.2 mEq/L — ABNORMAL LOW (ref 3.5–5.1)

## 2010-06-22 LAB — CBC
HCT: 31.6 % — ABNORMAL LOW (ref 36.0–46.0)
HCT: 37 % (ref 36.0–46.0)
Hemoglobin: 10.5 g/dL — ABNORMAL LOW (ref 12.0–15.0)
MCV: 84.6 fL (ref 78.0–100.0)
Platelets: 176 10*3/uL (ref 150–400)
Platelets: 220 10*3/uL (ref 150–400)
RBC: 4.45 MIL/uL (ref 3.87–5.11)
RDW: 12.1 % (ref 11.5–15.5)
WBC: 9.3 10*3/uL (ref 4.0–10.5)

## 2010-06-22 LAB — URINALYSIS, ROUTINE W REFLEX MICROSCOPIC
Bilirubin Urine: NEGATIVE
Hgb urine dipstick: NEGATIVE
Protein, ur: 30 mg/dL — AB
Specific Gravity, Urine: 1.013 (ref 1.005–1.030)
pH: 6.5 (ref 5.0–8.0)

## 2010-06-22 LAB — COMPREHENSIVE METABOLIC PANEL
ALT: 27 U/L (ref 0–35)
BUN: 12 mg/dL (ref 6–23)
CO2: 28 mEq/L (ref 19–32)
Calcium: 10 mg/dL (ref 8.4–10.5)
Chloride: 103 mEq/L (ref 96–112)
Glucose, Bld: 98 mg/dL (ref 70–99)
Total Bilirubin: 0.3 mg/dL (ref 0.3–1.2)

## 2010-06-22 LAB — URINE MICROSCOPIC-ADD ON

## 2010-06-22 LAB — TYPE AND SCREEN
ABO/RH(D): B POS
Antibody Screen: NEGATIVE

## 2010-06-22 LAB — BASIC METABOLIC PANEL
BUN: 8 mg/dL (ref 6–23)
Chloride: 104 mEq/L (ref 96–112)
Potassium: 3.5 mEq/L (ref 3.5–5.1)

## 2010-06-22 LAB — ABO/RH: ABO/RH(D): B POS

## 2010-06-22 LAB — DIFFERENTIAL
Eosinophils Absolute: 0.2 10*3/uL (ref 0.0–0.7)
Neutro Abs: 4.4 10*3/uL (ref 1.7–7.7)

## 2010-06-22 LAB — VITAMIN D 25 HYDROXY (VIT D DEFICIENCY, FRACTURES): Vit D, 25-Hydroxy: 10 ng/mL — ABNORMAL LOW (ref 30–89)

## 2010-07-26 NOTE — Op Note (Signed)
NAMENAVYA, TIMMONS                ACCOUNT NO.:  [id number]   MEDICAL RECORD NO.:  [id number]          PATIENT TYPE:  INP   LOCATION:  3031                         FACILITY:  [facility]   PHYSICIAN:  Nelda Severe, MD      DATE OF BIRTH:  1956-01-06   DATE OF PROCEDURE:  06/30/2008  DATE OF DISCHARGE:                               OPERATIVE REPORT   SURGEON:  Nelda Severe, MD   ASSISTANT:  Lianne Cure, PA-C   PREOPERATIVE DIAGNOSIS:  Pseudoarthrosis, C4-5, status post multilevel C-  spine fusion.   POSTOPERATIVE DIAGNOSIS:  Pseudoarthrosis, C4-5, status post multilevel  C-spine fusion.   OPERATIVE PROCEDURE:  Posterior fusion, C4-5 with pedicle with lateral  mass screws at C4, C5, and C6 bilaterally, Infuse, local bone graft.   OPERATIVE NOTE:  The patient was placed under general endotracheal  anesthesia.  Prophylactic antibiotics were infused.  There being no  sequential compression devices available for some unknown reason, TED  hose were applied to the lower extremities.   Mayfield head tongs were applied using Betadine ointment over the pins.  The patient was then positioned prone on a Jackson frame and the  Mayfield head holder attached to the frame attachment in such a manner  that the cephalo/cervico/thoracic position was neutral.  The arms were  positioned at the side on arm boards and well padded with foam.  The  thighs, knees, shins, and ankles were supported on pillows.   Tincture of benzoin was painted on the skin on the posterior-superior  aspect of both shoulders.  A broad adhesive tape was used to pull the  skin distally so as to make for a flat surface, which could be incised  in the posterior cervical area.  The hair was clipped as far as the  occiput proximally.  The posterior cervical area was then prepped with  DuraPrep and draped in rectangular fashion.  Drapes were secured with  strips of Ioban.   A midline incision was made from the mid cervical area  to the cervical  thoracic area.  Subcutaneous tissue was injected with a mixture of 0.25%  plain Marcaine and 1% lidocaine with epinephrine.  Dissection was  carried down to the ligamentum nuchae, which was divided vertically  around the incision and then brought on to the spinous processes of the  cervical spine.  Paraspinal muscles were mobilized bilaterally using  cutting current and Cobb elevators.  A cross-table lateral radiograph  was taken with a Kocher on what appeared to be the spinous process of  C4.  The C4 lamina and inferior articular process were cleared off as  well as the laminae of C5 and C6.  I was able to detect motion between  C4 and C5 by grasping the C4 spinous process with a Leksell rongeur and  torquing it.  Motion could be seen between the adjacent lamina, at C4-5.   We next made lateral mass holes on the right side of C4, C5, and C6.  These were all measured and measurements recorded.  I then used a high-  speed bur to resect  close to the articular surfaces of the apophyseal  joint of C4-5 on the right side.  The lateral mass screws were then  inserted.  Next, holes were made on the left side at C4, C5-C6 in the  lateral masses.  Each hole was sounded for depth and measured and the  measurements recorded.  Lateral mass screws were then inserted, after  using a countersunk device to remove bone on the medial side of the hole  to allow seating of the screws.  Next, the screws were all stimulated.  On the right side, they were all at levels above 15 or 16 mA.  On the  left side, the screws at C-5 and 6 were at levels below.  The screws  were removed.  The holes were repalpated and resounded for depth.  At  C6, the hole was felt to be more shallow than originally and a 12-mm  screw was inserted there.  At C5, the hole measured at 14 mm and the  original 12-mm screw was reinserted.   Rods were then contoured and attached to the screws on both sides and  torqued.    We had requested a small package of Infuse, which was prepared at the  beginning of the case.  A piece of collagen sponge soaked with Infuse  was cut and inserted into the facet joint on either side.  We then took  local bone graft, and after decorticating the lamina of C4 and C5,  tacked the local bone graft in and covered it with sheets of BMP-soaked  collagen sponge.  I placed Gelfoam over the construct in order to try to  prevent any migration of bone graft or BMP sponges.  A 15-gauge Blake  drain was inserted from the right side and brought out through the skin  distally and secured with a 2-0 nylon suture.  I then closed the fascia  and muscle using interrupted 0 Vicryl sutures.  Subcutaneous tissue was  closed using interrupted 2-0 Vicryl sutures and the skin was closed  using a subcuticular 3-0 undyed Vicryl in running fashion.  Skin edges  were reinforced with Steri-Strips.  A gauze dressing was applied and  secured with OpSite.   A radiograph was taken cross-table at the end of the case.  This showed  the correct levels, but beyond that the x-ray really cannot be  interpreted as showing correct level of screws.  This is not possible  from such projections.   Sponge and needle counts were correct.  There were no intraoperative  complications.  The patient was transferred into her bed and the  Mayfield tongs removed.  At the point of dictation now, I have not  examined the patient in the recovery room.   Blood loss estimated at less than 150 mL.  Sponge and needle counts were  correct.      Nelda Severe, MD  Electronically Signed     MT/MEDQ  D:  06/30/2008  T:  06/30/2008  Job:  [id number]

## 2010-07-26 NOTE — Discharge Summary (Signed)
Pamela [name], Pamela [name]                ACCOUNT NO.:  [id number]   MEDICAL RECORD NO.:  [id number]          PATIENT TYPE:  INP   LOCATION:  3031                         FACILITY:  [facility]   PHYSICIAN:  Nelda Severe, MD      DATE OF BIRTH:  Aug 06, 1955   DATE OF ADMISSION:  06/30/2008  DATE OF DISCHARGE:  07/04/2008                               DISCHARGE SUMMARY   FINAL DIAGNOSIS:  Pseudoarthrosis C4-5 status post anterior cervical  fusion.   The patient was taken to the operating room on the day of admission,  where posterior fusion of C4-5 was carried out.  She had previously had  anterior cervical fusions, including a repair a pseudoarthrosis at C4-5,  but fusion had not healed.  At this time, she was admitted for  management of pseudoarthrosis and associated cervical pain.   Postoperatively her course was not complicated, although she had  considerable pain for the first few days postop.  At the time of  discharge, her wound is stable.  The dressing has been removed.  Steri-  Strips were placed.  She has a soft collar and an Aspen collar to wear.  She is ambulatory.  Her pain is controlled with oral analgesics.   She has been instructed to call me for any occurrence of wound drainage  or fever which persists.  She is to avoid bending and lifting.  She is  to see me in the office in followup on Jul 13, 2008.   CONDITION ON DISCHARGE:  Stable and ambulatory, wound stable.   Discharge medications include Norco 7.5, 100 tablets, 1-2 q.6 h. p.r.n.,  maximum 6 per day and vitamin D 50,000 units, daily for 4 days, then  weekly for 7 weeks, and monthly for 8 months because of profound vitamin  D deficiency diagnosed on her admission blood work.      Nelda Severe, MD  Electronically Signed     MT/MEDQ  D:  07/04/2008  T:  07/04/2008  Job:  [id number]

## 2010-07-26 NOTE — Discharge Summary (Signed)
Pamela [name], Pamela [name]                ACCOUNT NO.:  [id number]   MEDICAL RECORD NO.:  [id number]          PATIENT TYPE:  INP   LOCATION:  3031                         FACILITY:  [facility]   PHYSICIAN:  Nelda Severe, MD      DATE OF BIRTH:  1955-10-26   DATE OF ADMISSION:  06/30/2008  DATE OF DISCHARGE:  07/04/2008                               DISCHARGE SUMMARY   ADDENDUM   The patient asked for and I provided a prescription for Restoril 15 mg,  1-2 nightly p.r.n. for sleep.  She has been getting this here in the  hospital, it has been helping her at night.      Nelda Severe, MD  Electronically Signed     MT/MEDQ  D:  07/04/2008  T:  07/05/2008  Job:  [id number]

## 2010-07-26 NOTE — H&P (Signed)
NAMEBIBIANA, Pamela [name]                ACCOUNT NO.:  [id number]   MEDICAL RECORD NO.:  [id number]          PATIENT TYPE:  INP   LOCATION:  NA                           FACILITY:  [facility]   PHYSICIAN:  Nelda Severe, MD      DATE OF BIRTH:  05-11-1955   DATE OF ADMISSION:  05/05/2008  DATE OF DISCHARGE:  05/05/2008                              HISTORY & PHYSICAL   CHIEF COMPLAINT:  Cervical neck pain, status post fusion;  pseudoarthrosis C4-5.   ALLERGIES:  She has no known drug allergies.   CURRENT MEDICATIONS:  1. Diovan.  2. Tramadol.  3. Darvocet.   PAST MEDICAL HISTORY:  Cervical fusion x2 from C4-C7 with  pseudoarthrosis anterior repair at C4-5, hypertension, kidney problems.   FAMILY HISTORY:  Coronary artery disease, hypertension, diabetes.   REVIEW OF SYSTEMS:  Today, she reports no fever, no chills, no nausea,  vomiting, diarrhea.  No hemoptysis.  No bowel or bladder incontinence.  No shortness of breath.  No chest pain.   PHYSICAL EXAMINATION:  HEENT:  She appears normocephalic.  Pupils are  equal, round, and reactive to light.  NECK:  Range of motion is limited secondary to pain.  More specifically,  extension increases pain.  CHEST:  Clear to auscultation.  No wheezes noted.  HEART:  Regular rate and rhythm.  No murmurs noted.  ABDOMEN:  Soft.  Nontender to palpation.  Positive bowel sounds on  auscultation.  EXTREMITIES:  Neurovascularly motor intact in bilateral upper  extremities.  She does have decreased shoulder overhead motion secondary  to neck pain.   CT scan revealed C4-5 pseudoarthrosis.   OPERATIVE PLAN:  Posterior cervical fusion C4-5 by Dr. Nelda Severe.     Lianne Cure, P.A.      Nelda Severe, MD  Electronically Signed   MC/MEDQ  D:  06/26/2008  T:  06/27/2008  Job:  [id number]

## 2010-07-29 NOTE — Op Note (Signed)
NAME:  Pamela [name], Pamela [name]                          ACCOUNT NO.:  [id number]   MEDICAL RECORD NO.:  [id number]                   PATIENT TYPE:  OIB   LOCATION:  3013                                 FACILITY:  [facility]   PHYSICIAN:  Donalee Citrin, M.D.                     DATE OF BIRTH:  July 07, 1955   DATE OF PROCEDURE:  02/27/2002  DATE OF DISCHARGE:                                 OPERATIVE REPORT   PREOPERATIVE DIAGNOSIS:  C6 and C7 radiculopathy, right from severe cervical  spondylosis and stenosis C5-6 and C6-7.   POSTOPERATIVE DIAGNOSIS:  C6 and C7 radiculopathy, right from severe  cervical spondylosis and stenosis C5-6 and C6-7.   OPERATION PERFORMED:  Anterior cervical diskectomy and fusion at C5-6 and C6-  7 using 6 mm Tutogen patellar wedge at C5-6, an 8 mm Tutogen patellar wedge  at C6-7, a 40 mm Atlantis plate, and five 13 mm variable angle screws.   SURGEON:  Donalee Citrin, M.D.   ASSISTANT:  Reinaldo Meeker, M.D.   ANESTHESIA:  General endotracheal.   INDICATIONS FOR PROCEDURE:  The patient is a very pleasant 55 year old  female who has had longstanding neck and right greater than left arm pain  that has been getting progressively worse over the last several months.  This has been refractory to conservative treatment and _________ shows  severe cervical spinal stenosis with a large central disk and bone spur at  C5-6 as well as bilateral C7 nerve root compression ________ hypertrophy and  spondylosis at C6-7.  The patient was recommended anterior cervical  diskectomies and fusion.  Discussion of the risks and benefits of surgery  with her.  She understands and agreed to proceed forward.   DESCRIPTION OF PROCEDURE:  The patient was brought to the operating room and  induced under general anesthesia, prone positioned supine.  Neck slight  extension 5 pounds halter traction.  Right side of the neck was prepped and  draped in the usual sterile fashion.  ________________ the  superficial layer  of the platysma was dissected out and divided longitudinally.  The avascular  plan between sternocleidomastoid muscle and strap muscles was developed down  to prevertebral fascia.  The prevertebral fascia was dissected with  Kitner's.  The longus colli was then reflected laterally.  C-Arm  ___________________ disk space.  Annulotomy was made.  Pituitary rongeurs  were used to remove the anterior margin of disk space.  I marked this disk  space. The remainder of the longus colli was reflected laterally at the  interspace below and the self-retaining retractor was placed.  Both  annulotomies were then completed.  Pituitary rongeurs were used to remove  the anterior margin of both disks.  A high speed drill was used to drill  down to the posterior annulus, posterior longitudinal ligament at both  levels and the operating microscope was draped and brought  into the field  _________________ at C5-6 initially.  The disk space was cleaned out and  ____________ was visualized and there was noted to be a very large  osteophytic spur compressing the central aspect of the thecal sac coming off  the C5 vertebral body.  This was all under bitten with a 2 mm Kerrison  punch.  The thecal sac was noted to be widely decompressed came back and  resumed its normal conformation.  Both proximal C6  neural foramina were  opened up and decompressed.  The foramen were explored with an angled nerve  hook and noted to have no further stenosis.  Gelfoam was placed in this  interspace.  Attention was taken out to 6-7.  Again the drill was used to  drill down to the posterior longitudinal ligament.  Using 1 and 2 mm  Kerrison punches several large fragments of disk were removed.  Both from  the lateral aspect of the thecal sac and the uncinate process was noted to  be severely hypertrophied.  This was all under bitten with a 2 mm Kerrison  punch.  The thecal sac was then visualized.  The posterior  longitudinal  ligament was removed and both proximal C6 neural foramina were widely opened  up and explored with angled nerve hook and noted to have no further  stenosis.  After both disks were cleaned out and both levels were  decompressed, the end plates were then prepared to receive the bone graft,  scraped with a BA upgoing curette and drilled with a high speed drill.  An 8  mm Tutogen patellar wedge was inserted at C6-7 and 6 mm at C5-6.  A 40 mm  Atlantis plate was sized and selected.  The _____ at C5 was first drilled,  tapped and a 13 mm variable angled screw was inserted at C5 and on the right  side at C7.  This hole was drilled, tapped and the screw was stripped.  ____________ screw was also stripped.  A left-sided screw was placed.  Another excellent purchase at this level and the other additional four  screws were inserted at C5 and C6. There was no screw at C7 on the right  side; however, all other five screws had excellent purchase, plate was in  good position.  Postoperative x-ray confirmed good position of screws, plate  and bone grafts.  The wound was copiously irrigated.  Meticulous hemostasis  maintained.  Platysma was reapproximated with 3-0 interrupted Vicryl.  The  skin was closed with running 4-0 subcuticular, Benzoin and Steri-Strips were  applied.  The patient went to the recovery room in stable condition.  At the  end of case all needles counts and sponge counts were correct.                                                Donalee Citrin, M.D.    GC/MEDQ  D:  02/27/2002  T:  02/27/2002  Job:  [id number]

## 2010-07-29 NOTE — Procedures (Signed)
NAMENOE, PITTSLEY                ACCOUNT NO.:  [id number]   MEDICAL RECORD NO.:  [id number]          PATIENT TYPE:  OUT   LOCATION:  SLEEP CENTER                 FACILITY:  [facility]   PHYSICIAN:  Marcelyn Bruins, M.D. [facility] DATE OF BIRTH:  1955/10/11   DATE OF STUDY:  01/08/2004                              NOCTURNAL POLYSOMNOGRAM   REFERRING PHYSICIAN:  Dr. Thomos Lemons.   INDICATION FOR THE STUDY:  Hypersomnia with sleep apnea.   SLEEP ARCHITECTURE:  The patient had a total sleep time of 449 minutes with  a sleep efficiency of 97%.  There was decreased slow wave sleep and REM  noted.  Sleep onset latency was very rapid at 3 minutes, as was REM onset at  14-and-a-half minutes.   IMPRESSION:  1.  Mild obstructive sleep apnea with a respiratory disturbance index of 16      events per hour and oxygen desaturation as low as 84%.  Events were not      positional.  2.  Moderate intermittent snoring noted throughout the study.  3.  No clinically significant cardiac arrhythmias.      KC/MEDQ  D:  02/03/2004 12:31:32  T:  02/03/2004 14:34:54  Job:  [id number]

## 2010-07-29 NOTE — Op Note (Signed)
NAMELEANDA, PADMORE                ACCOUNT NO.:  [id number]   MEDICAL RECORD NO.:  [id number]          PATIENT TYPE:  OIB   LOCATION:  3014                         FACILITY:  [facility]   PHYSICIAN:  Donalee Citrin, M.D.        DATE OF BIRTH:  1955/05/31   DATE OF PROCEDURE:  06/19/2005  DATE OF DISCHARGE:                                 OPERATIVE REPORT   PREOPERATIVE DIAGNOSES:  Cervical spondylosis with neck pain, right shoulder  pain, at C4-5 with spondylitic compression of the spinal cord and right C5  nerve root.   POSTOPERATIVE DIAGNOSES:  Cervical spondylosis with neck pain, right  shoulder pain, at C4-5 with spondylitic compression of the spinal cord and  right C5 nerve root.   PROCEDURE:  Anterior cervical discectomy and fusion at C4-5 using a 6-mm  LifeNet wedge and 30-mm Venture plate.  Exploration of fusion at C5 to C7.  Removal of hardware at C5 to C7.   SURGEON:  Donalee Citrin, M.D.   ASSISTANT:  Kathaleen Maser. Pool, M.D.   ANESTHESIA:  General endotracheal.   FINDINGS:  Solid bony fusion from C5 to C7 that facilitated removal of the  hardware at C5 to C7.  Severe spondylitic compression of the C5 nerve root  on the right due to in large part of migrated disk material, but large bony  osteophytosis at C4-5 compressing the proximal right C5 nerve root.   HOSPITAL COURSE:  The patient is a very pleasant 55 year old female who  underwent previous C5 to C7 fusion approximately 1-2 years ago.  The patient  had progressively worsening neck and primarily right shoulder pain.  Repeat  imaging showed severe spondylitic compression and breakdown at C4-5 causing  spinal cord compression and right proximal foraminal stenosis at C5 nerve  root.  The patient failed __________  recommended anterior cervical  diskectomy and fusion.  Risks and benefits were explained to the patient.  She understands and agrees to proceed forward.  She was also recommended  removal of hardware in order to  facilitate the extension of her fusion up to  C4-5.   The patient was brought to the OR, received general endotracheal anesthesia,  placed in supine and neck placed in extension with 5 pounds of halter  traction.  The right side of her neck was prepped and draped in the usual  sterile fashion.  Her old incision was localized around the C5 vertebral  body, so this was felt to be adequate to open up, going through old incision  on the right side of her neck to re-explore.  The scar was dissected free.  The plate was immediately identified.  The locking screw mechanisms were  loosened.  The screws were loosened. The plate was removed from C5 to C7.  The C5-6 and C6-7 bony fusions were solid.  C4-5 disk space was identified  and it was marked, fluoroscopy confirmed, and annulotomy was extended with a  15-blade scalpel.  This disk was noted to be markedly degenerated.  The  annulotomy was extended.  The longus collis was reflected laterally and  self-  retaining retractors were placed.  Then, the interspace was drilled down at  a posterior angle __________ posterior osteophytic complexes.  Using 1 and 2-  mm Kerrison punches, a large osteophyte coming off the C4 vertebral body was  underbitten and this decompressed the central canal allowing the thecal sac  to resume its normal anatomic location.  Both proximal C5 neural foramen  were identified.  The C5 pedicles were identified and the C5 nerve root were  rapidly decompressed out their foramen.  The endplate posteriorly at C5 was  also underbitten.  This further decompressed the central canal and thecal  sac. Then, the wound was copiously irrigated and meticulous hemostasis was  maintained.  A C6 bone graft was sized, selected, fashioned, and inserted  __________  vertebral line using the old screws at C5.  A 30-mm plate was  necessary to re-align the old screws.  In 5, two 50-mm rescue screws were  placed here.  Two new screws were drilled in  4.  All screws with excellent  purchase.  Set screws and locking mechanisms were immediately engaged with  the Venture plating system.  Then, the wound was copiously irrigated.  Meticulous hemostasis was maintained.  The platysma was reapproximated with  interrupted Vicryl and skin closed with running 4-0 subcuticular.  __________  is applied.  The patient went to the recovery room in stable  condition.  At the end of the case, instrument counts were correct.           ______________________________  Donalee Citrin, M.D.     GC/MEDQ  D:  06/19/2005  T:  06/20/2005  Job:  [id number]

## 2010-07-29 NOTE — H&P (Signed)
[place]. [facility]  Patient:    Pamela [name], Pamela [name]                       MRN: [id number] Proc. Date: 08/31/00 Adm. Date:  [id number] Attending:  Evlyn Clines CC:         Sung Amabile. Roslyn Smiling, M.D.   History and Physical  CHIEF COMPLAINT:  Fever, left flank pain.  HISTORY OF PRESENT ILLNESS:  This is a 55 year old black female with a two-week history of left flank pain, fever, and irritative voiding symptoms. She was seen in Dr. Penni Homans office today and was found to have a fever to 104.  She was sent to the ER.  CT scan revealed a probable 2 mm left UVJ stone with mild obstruction.  A urinalysis appeared infected.  CT also demonstrated a small contracted right kidney.  ALLERGIES:  She has an intolerance to VICODIN.  MEDICATIONS:  Maxzide.  PAST MEDICAL HISTORY:  Hypertension, fibroids, history of stones, history of cervical cancer.  PAST SURGICAL HISTORY:  Cervical conization.  PAST OBSTETRICAL HISTORY:  She is gravida 4, para 4 with normal deliveries x 4.  One was a stillbirth.  SOCIAL HISTORY:  Negative for tobacco or alcohol.  She works at hospice.  FAMILY HISTORY:  Hypertension, diabetes, and heart disease.  REVIEW OF SYSTEMS:  She has had some shortness of breath with her fevers.  She has had some pedal edema.  She has had some headaches.  She denies chest pain or GI complaints.  Her last period was Aug 07, 2000.  She is otherwise without complaints.  PHYSICAL EXAMINATION:  VITAL SIGNS:  Temperature 104.4, blood pressure 140/70, pulse 110, respirations 22.  GENERAL:  She is an ill-appearing black female in no acute distress.  Alert and oriented x 3.  HEENT:  Head and face normocephalic, atraumatic.  NECK:  Supple without thyromegaly.  SKIN:  Hot and dry.  LUNGS:  Clear to auscultation with normal effort.  HEART:  She has tachycardia.  ABDOMEN:  Soft, obese with left CVA tenderness, left lower quadrant tenderness.  No mass,  hepatosplenomegaly, or hernias are noted.  GENITOURINARY:  Exam will be done at cystoscopy.  She has no inguinal adenopathy.  EXTREMITIES:  Full range of motion without edema.  NEUROLOGIC:  She is grossly intact.  LABORATORY DATA:  Her lab work was reviewed.  She has a mild elevation of her white count.  She has normal BUN and creatinine.  Her urine had pyuria and hematuria.  Culture was ordered.  IMPRESSION:  Left pyelonephritis with a small left distal ureteral stone with obstruction.  PLAN:  The patient has received IV Cipro.  We will continue that during admission.  I will get her set up for cystoscopy with left stent insertion this evening to decompress the kidney.  The risks of that procedure including bleeding, infection, ureteral injury, and anesthetic complications.  She understands the risks and is willing to proceed. DD:  08/31/00 TD:  09/03/00 Job: [id number]

## 2010-07-29 NOTE — Discharge Summary (Signed)
[place]. [facility]  Patient:    Pamela [name], Pamela [name]                       MRN: [id number] Adm. Date:  [id number] Disc. Date: [id number] Attending:  Evlyn Clines CC:         Sung Amabile. Roslyn Smiling, M.D.   Discharge Summary  HISTORY OF PRESENT ILLNESS:  Briefly, Ms. Scrima is a 55 year old black female who presented with a two-week history of left flank pain, fever, and irritative voiding symptoms.  She was seen in Dr. Sung Amabile. Forhans the day of admission.  She presented with a fever of 104 degrees.  A CT scan in the ER revealed a 2 mm left UVJ stone with evidence of a small contracted right kidney.  ALLERGIES:  Intolerance to VICODIN.  MEDICATIONS:  Maxzide.  PAST MEDICAL HISTORY:  Pertinent for hypertension, fibroids, history of stones, and history of cervical cancer.  Gravida 4, para 4, normal deliveries, and one stillbirth.  PAST SURGICAL HISTORY:  Pertinent for cervical conization.  SOCIAL HISTORY:  Negative tobacco or alcohol.  FAMILY HISTORY:  Hypertension, diabetes, and heart disease.  REVIEW OF SYSTEMS:  She had some shortness of breath, pedal edema, and headaches, otherwise without complaints.  PHYSICAL EXAMINATION:  Please see the dictated note.  HOSPITAL COURSE:  On the day of admission, she was taken to the operating room where she underwent cystoscopy.  She was found to have the stone dislodged into the bladder.  She had significant evidence of chronic follicular cystitis.  Postoperatively, she was maintained on Tequin.  On the first postoperative day, her temperature was 101.7 degrees and she had some flank pain.  On the second postoperative day, her temperature maximum was 102.2 degrees, but she was afebrile the morning of the visit.  Her urine culture grew an Escherichia coli and her blood culture gram-negative rods.  On September 03, 2000, her flank pain has resolved.  She said that she was having a bit of a headache.  She remained  afebrile.  Her urine culture demonstrated pansensitive Escherichia coli.  She was felt to be ready for discharge home.  FINAL DIAGNOSES: 1. Escherichia coli septicemia with pyelonephritis and ureteral calculus. 2. Hydronephrosis. 3. History of hypertension. 4. History of cervical cancer. 5. Trigonitis.  COMPLICATIONS DURING HER ADMISSION:  None.  DISCHARGE MEDICATIONS: 1. Bactrim DS one p.o. b.i.d. 2. Darvocet-N 100 one to two p.o. q.4-6h. p.r.n. pain.  FOLLOW-UP:  She was instructed to follow up with Excell Seltzer. Annabell Howells, M.D., in two weeks.  DISPOSITION:  To home.  CONDITION ON DISCHARGE:  Improved. DD:  09/27/00 TD:  09/30/00 Job: [id number]

## 2010-07-29 NOTE — Op Note (Signed)
[place]. [facility]  Patient:    Pamela [name], Pamela [name]                       MRN: [id number] Proc. Date: 08/31/00 Adm. Date:  [id number] Attending:  Evlyn Clines CC:         Sung Amabile. Roslyn Smiling, M.D.   Operative Report  PROCEDURE:  Cystoscopy.  PREOPERATIVE DIAGNOSIS:  Left ureterovesical junction stone with pyelonephritis.  POSTOPERATIVE DIAGNOSIS:  Left ureterovesical junction stone with pyelonephritis, with chronic cystitis and the stone had moved to the bladder.  SURGEON:  Excell Seltzer. Annabell Howells, M.D.  ANESTHESIA:  MAC.  COMPLICATIONS:  None.  INDICATIONS:  [name] is a 55 year old black female with a history of stones, who came to the ER today with a fever of 104 and left flank pain.  She had had symptoms for two weeks.  A CT scan revealed a 2 mm stone in the area of the left distal ureter with some mild hydronephrosis.  The urine was infected.  It was felt that cystoscopy and stent was indicated to decompress the kidney.  FINDINGS AND PROCEDURE:  The patient was taken to the operating room, where she was placed in lithotomy position.  Her perineum and genitalia were prepped with Betadine solution.  MAC sedation was induced.  She was draped in the usual sterile fashion.  Cystoscopy was performed using a 22 [place] scope and the 12 degree lens.  Examination revealed a normal urethra.  The bladder wall had evidence of chronic follicular cystitis with multiple lymphoid follicles. The ureteral orifices were difficult to identify due to the presence of this heaped-up lymphoid tissue.  Nothing suspicious for a tumor was identified. After some inspection, a calculus was noted in the area of the right trigone consistent with the stone seen on CT scan, and it was felt that the stone had passed into the bladder.  At this point, attempt was made to irrigate out the stone, but I was not able to get the stone to come out with the irrigation fluid.  I felt that it  was small enough that it should pass, and we will just strain her urine following the procedure.  The bladder was drained, and the cystoscope was removed.  The patient was taken down from the lithotomy position and removed to the recovery room in stable condition.  There were no complications. DD:  08/31/00 TD:  09/03/00 Job: [id number]

## 2010-08-23 ENCOUNTER — Telehealth: Payer: Self-pay | Admitting: Internal Medicine

## 2010-08-23 NOTE — Telephone Encounter (Signed)
I can understand the reason for the PA, but can we find out who actually needs the other note she refers to, what it needs to say, and why it is needed  (other pt's have new medicaid and dont need notes for meds)

## 2010-08-23 NOTE — Telephone Encounter (Signed)
PA request for Zolpidem Tartrate ER 12.5mg  tab faxed to [facility] office on 08/19/2010 @ 13:32pm forwarded to myself [out of the office 08/18/10-08/22/2010] 08/23/2010 to begin PA process.  Medication is not on preferred drug list for Medicaid and Pt's chart only shows [1] medication previously used for the Dx of insomnia [Lunesta]--[2] are required as part of approval process.  PA form with preferred drug list for Dx attached forwarded to MD for completion or medication change.

## 2010-08-23 NOTE — Telephone Encounter (Signed)
Medicaid prefers zolpidem (the non-CR version) - ? Ok to change

## 2010-08-23 NOTE — Telephone Encounter (Signed)
Pt left vm on Triage A req a call regarding her RFs.

## 2010-08-23 NOTE — Telephone Encounter (Signed)
Pt needs note why she is on tramadol,ambien & also the medicine she takes for muscle spasms. Pt needs note faxed to her pharmacy,[facility] [address]. Pt just got Medicaid and that is why she needs note.

## 2010-08-23 NOTE — Telephone Encounter (Signed)
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

## 2010-08-26 MED ORDER — ZOLPIDEM TARTRATE 10 MG PO TABS: 10.0000 mg | ORAL_TABLET | Freq: Every evening | ORAL | Status: DC | PRN

## 2010-08-26 NOTE — Telephone Encounter (Signed)
Okay to change to Zolpidem per pt

## 2010-08-26 NOTE — Telephone Encounter (Signed)
Done hardcopy to a johnson/side b

## 2010-08-27 NOTE — Telephone Encounter (Signed)
Rx faxed to pharmacy-unable to reach pt at # listed above (busy signal)

## 2010-08-29 ENCOUNTER — Telehealth: Payer: Self-pay | Admitting: *Deleted

## 2010-08-29 NOTE — Telephone Encounter (Signed)
Pt requesting Rx for Tramadol to [facility]

## 2010-08-29 NOTE — Telephone Encounter (Signed)
I think too soon, as she had total 6 mo sent in May 18 2010 - see centricity

## 2010-08-29 NOTE — Telephone Encounter (Signed)
Pt informed

## 2010-08-30 ENCOUNTER — Telehealth: Payer: Self-pay

## 2010-08-30 NOTE — Telephone Encounter (Signed)
Pharmacy advised of same and requested to refill if Rx was not received 05/2010

## 2010-08-30 NOTE — Telephone Encounter (Signed)
Called to get prior authorization for Tramadol HCL ER 300 mg. Authorization was approved for 1 year.

## 2010-09-12 ENCOUNTER — Other Ambulatory Visit: Payer: Self-pay | Admitting: Internal Medicine

## 2010-09-12 NOTE — Telephone Encounter (Signed)
Lisinopril-HCTZ changed to Losartan Potassium-HCTZ 100-25mg  on 05/18/10.?

## 2010-11-16 ENCOUNTER — Telehealth: Payer: Self-pay

## 2010-11-16 MED ORDER — LOSARTAN POTASSIUM-HCTZ 50-12.5 MG PO TABS: 1.0000 | ORAL_TABLET | Freq: Every day | ORAL | Status: DC

## 2010-11-16 NOTE — Telephone Encounter (Signed)
Ok to change to hyzaar as per med list

## 2010-11-16 NOTE — Telephone Encounter (Signed)
Pharmacy request from patient to change BP medication. The patient is making her cough.

## 2010-11-17 ENCOUNTER — Ambulatory Visit: Payer: Self-pay | Admitting: Internal Medicine

## 2011-02-21 ENCOUNTER — Other Ambulatory Visit (HOSPITAL_COMMUNITY): Payer: Self-pay | Admitting: Nephrology

## 2011-02-21 DIAGNOSIS — K219 Gastro-esophageal reflux disease without esophagitis: Secondary | ICD-10-CM

## 2011-02-24 ENCOUNTER — Ambulatory Visit (HOSPITAL_COMMUNITY)
Admission: RE | Admit: 2011-02-24 | Discharge: 2011-02-24 | Disposition: A | Discharge status: Home / Self Care | Payer: Medicaid Other | Source: Ambulatory Visit | Attending: Nephrology | Admitting: Nephrology

## 2011-02-24 DIAGNOSIS — K219 Gastro-esophageal reflux disease without esophagitis: Secondary | ICD-10-CM | POA: Insufficient documentation

## 2011-02-24 DIAGNOSIS — K449 Diaphragmatic hernia without obstruction or gangrene: Secondary | ICD-10-CM | POA: Insufficient documentation

## 2011-03-02 ENCOUNTER — Ambulatory Visit (HOSPITAL_COMMUNITY)
Admission: RE | Admit: 2011-03-02 | Discharge: 2011-03-02 | Disposition: A | Discharge status: Home / Self Care | Payer: Medicaid Other | Source: Ambulatory Visit | Attending: Nephrology | Admitting: Nephrology

## 2011-03-02 DIAGNOSIS — K3189 Other diseases of stomach and duodenum: Secondary | ICD-10-CM | POA: Insufficient documentation

## 2011-03-02 DIAGNOSIS — R109 Unspecified abdominal pain: Secondary | ICD-10-CM | POA: Insufficient documentation

## 2011-03-02 DIAGNOSIS — K219 Gastro-esophageal reflux disease without esophagitis: Secondary | ICD-10-CM | POA: Insufficient documentation

## 2011-03-02 DIAGNOSIS — R6881 Early satiety: Secondary | ICD-10-CM | POA: Insufficient documentation

## 2011-03-02 DIAGNOSIS — R142 Eructation: Secondary | ICD-10-CM | POA: Insufficient documentation

## 2011-03-02 DIAGNOSIS — R110 Nausea: Secondary | ICD-10-CM | POA: Insufficient documentation

## 2011-03-02 DIAGNOSIS — R141 Gas pain: Secondary | ICD-10-CM | POA: Insufficient documentation

## 2011-03-02 MED ORDER — TECHNETIUM TC 99M SULFUR COLLOID
2.0000 | Freq: Once | INTRAVENOUS | Status: AC | PRN
  Administered 2011-03-02: 2 via INTRAVENOUS

## 2011-03-09 ENCOUNTER — Other Ambulatory Visit: Payer: Self-pay | Admitting: Internal Medicine

## 2011-03-10 NOTE — Telephone Encounter (Signed)
Rx faxed to [facility]

## 2011-07-13 ENCOUNTER — Other Ambulatory Visit: Payer: Self-pay | Admitting: Internal Medicine

## 2011-08-14 ENCOUNTER — Other Ambulatory Visit: Payer: Self-pay | Admitting: Nephrology

## 2011-08-14 ENCOUNTER — Ambulatory Visit
Admission: RE | Admit: 2011-08-14 | Discharge: 2011-08-14 | Disposition: A | Discharge status: Home / Self Care | Payer: Medicaid Other | Source: Ambulatory Visit | Attending: Nephrology | Admitting: Nephrology

## 2011-08-14 DIAGNOSIS — R52 Pain, unspecified: Secondary | ICD-10-CM

## 2011-08-19 ENCOUNTER — Emergency Department (HOSPITAL_COMMUNITY): Payer: Medicaid Other

## 2011-08-19 ENCOUNTER — Emergency Department (HOSPITAL_COMMUNITY)
Admission: EM | Admit: 2011-08-19 | Discharge: 2011-08-19 | Disposition: A | Discharge status: Home / Self Care | Payer: Medicaid Other | Attending: Emergency Medicine | Admitting: Emergency Medicine

## 2011-08-19 ENCOUNTER — Encounter (HOSPITAL_COMMUNITY): Payer: Self-pay | Admitting: Emergency Medicine

## 2011-08-19 DIAGNOSIS — M5416 Radiculopathy, lumbar region: Secondary | ICD-10-CM

## 2011-08-19 DIAGNOSIS — M545 Low back pain, unspecified: Secondary | ICD-10-CM

## 2011-08-19 DIAGNOSIS — I10 Essential (primary) hypertension: Secondary | ICD-10-CM | POA: Insufficient documentation

## 2011-08-19 DIAGNOSIS — M25559 Pain in unspecified hip: Secondary | ICD-10-CM | POA: Insufficient documentation

## 2011-08-19 DIAGNOSIS — M47817 Spondylosis without myelopathy or radiculopathy, lumbosacral region: Secondary | ICD-10-CM | POA: Insufficient documentation

## 2011-08-19 HISTORY — DX: Essential (primary) hypertension: I10

## 2011-08-19 MED ORDER — HYDROCODONE-ACETAMINOPHEN 5-325 MG PO TABS: 1.0000 | ORAL_TABLET | Freq: Four times a day (QID) | ORAL | Status: AC | PRN

## 2011-08-19 MED ORDER — PREDNISONE 50 MG PO TABS: 50.0000 mg | ORAL_TABLET | Freq: Every day | ORAL | Status: AC

## 2011-08-19 MED ORDER — KETOROLAC TROMETHAMINE 60 MG/2ML IM SOLN
60.0000 mg | Freq: Once | INTRAMUSCULAR | Status: AC
  Administered 2011-08-19: 60 mg via INTRAMUSCULAR
  Filled 2011-08-19: qty 2

## 2011-08-19 MED ORDER — HYDROCODONE-ACETAMINOPHEN 5-325 MG PO TABS
1.0000 | ORAL_TABLET | Freq: Once | ORAL | Status: AC
  Administered 2011-08-19: 1 via ORAL
  Filled 2011-08-19: qty 1

## 2011-08-19 NOTE — ED Notes (Signed)
Right hip pain since 2006. Has appointment to see Dr. Bascom Levels next week. Had xray 08/14/2011 at [facility]

## 2011-08-19 NOTE — Discharge Instructions (Signed)
Your x-ray showed degenerative changes in your lower back.  Followup with your Dr. for recheck return here as needed for any worsening in your condition.  Use ice and heat on your lower back.

## 2011-08-19 NOTE — ED Provider Notes (Signed)
History     CSN: [id number]  Arrival date & time 08/19/11  1316   First MD Initiated Contact with Patient 08/19/11 1450      Chief Complaint  Patient presents with  . Hip Pain    (Consider location/radiation/quality/duration/timing/severity/associated sxs/prior treatment) Patient is a 56 y.o. female presenting with hip pain.  Hip Pain   Patient presents emergency department with hip and lower back pain for the last 6 months.  Patient, says she has been seen by her doctor has given her treatment for this but nothing seems to help.  Patient, states that the pain in her hip is more significant than her lower back.  Patient denies numbness, weakness, gait disturbance, bowel/bladder incontinence, fever, chest pain, shortness of breath, or abdominal pain.  Patient, states that movement and palpation seem to make the pain worse. Past Medical History  Diagnosis Date  . Hypertension     No past surgical history on file.  No family history on file.  History  Substance Use Topics  . Smoking status: Former Games developer  . Smokeless tobacco: Not on file  . Alcohol Use: Yes     occasionally    OB History    Grav Para Term Preterm Abortions TAB SAB Ect Mult Living                  Review of Systems All other systems negative except as documented in the HPI. All pertinent positives and negatives as reviewed in the HPI.  Allergies  Review of patient's allergies indicates no known allergies.  Home Medications   Current Outpatient Rx  Name Route Sig Dispense Refill  . BUTALBITAL-APAP-CAFF-COD 50-325-40-30 MG PO CAPS Oral Take 1 capsule by mouth every 4 (four) hours as needed. Headache    . CYMBALTA 60 MG PO CPEP  take 1 capsule by mouth once daily 30 capsule 0    Patient needs office visit for more refills  . DEXLANSOPRAZOLE 30 MG PO CPDR Oral Take 30 mg by mouth daily.    Marland Kitchen DICLOFENAC SODIUM 1 % TD GEL Topical Apply 1 application topically 4 (four) times daily as needed. Inflammation     . LOSARTAN POTASSIUM-HCTZ 50-12.5 MG PO TABS Oral Take 1 tablet by mouth daily. 30 tablet 11  . METOCLOPRAMIDE HCL 10 MG PO TABS Oral Take 10 mg by mouth 4 (four) times daily.    Marland Kitchen ZOLPIDEM TARTRATE 10 MG PO TABS  take 1 tablet by mouth at bedtime if needed for sleep 30 tablet 3    BP 120/96  Pulse 121  Temp(Src) 98.6 F (37 C) (Oral)  Resp 20  Ht 5\' 10"  (1.778 m)  Wt 232 lb (105.235 kg)  BMI 33.29 kg/m2  SpO2 97%  Physical Exam  Constitutional: She appears well-developed and well-nourished. No distress.  HENT:  Head: Normocephalic and atraumatic.  Cardiovascular: Normal rate and regular rhythm.   Pulmonary/Chest: Effort normal and breath sounds normal.  Musculoskeletal:       Right hip: She exhibits tenderness. She exhibits normal range of motion, normal strength, no bony tenderness, no swelling, no crepitus and no deformity.       Back:       Legs: Neurological: She has normal strength. No sensory deficit. Coordination and gait normal.  Reflex Scores:      Patellar reflexes are 2+ on the right side and 2+ on the left side.      Achilles reflexes are 2+ on the right side and 2+ on the  left side.   ED Course  Procedures (including critical care time)   I reviewed the patient's hip films from June 3. The patient has no neurodeficits noted on exam.  I feel the patient's pain is coming from her lumbar spine region based on her physical exam findings.  She is told to followup with her primary care doctor for recheck.  Her lumbar spine films showed degenerative changes.  Patient has normal reflexes on exam.  Patient also has normal gait. MDM         Carlyle Dolly, PA-C 08/20/11 0100

## 2011-08-20 NOTE — ED Provider Notes (Signed)
Medical screening examination/treatment/procedure(s) were performed by non-physician practitioner and as supervising physician I was immediately available for consultation/collaboration.   Dayton Bailiff, MD 08/20/11 1452

## 2011-09-12 ENCOUNTER — Other Ambulatory Visit: Payer: Self-pay | Admitting: Internal Medicine

## 2011-10-07 ENCOUNTER — Encounter (HOSPITAL_COMMUNITY): Payer: Self-pay | Admitting: Emergency Medicine

## 2011-10-07 ENCOUNTER — Emergency Department (HOSPITAL_COMMUNITY)
Admission: EM | Admit: 2011-10-07 | Discharge: 2011-10-07 | Disposition: A | Discharge status: Home / Self Care | Payer: Medicaid Other | Attending: Emergency Medicine | Admitting: Emergency Medicine

## 2011-10-07 DIAGNOSIS — Z87891 Personal history of nicotine dependence: Secondary | ICD-10-CM | POA: Insufficient documentation

## 2011-10-07 DIAGNOSIS — I10 Essential (primary) hypertension: Secondary | ICD-10-CM | POA: Insufficient documentation

## 2011-10-07 DIAGNOSIS — H60399 Other infective otitis externa, unspecified ear: Secondary | ICD-10-CM | POA: Insufficient documentation

## 2011-10-07 DIAGNOSIS — J329 Chronic sinusitis, unspecified: Secondary | ICD-10-CM | POA: Insufficient documentation

## 2011-10-07 DIAGNOSIS — H6090 Unspecified otitis externa, unspecified ear: Secondary | ICD-10-CM

## 2011-10-07 MED ORDER — HYDROCODONE-ACETAMINOPHEN 5-325 MG PO TABS
1.0000 | ORAL_TABLET | Freq: Once | ORAL | Status: AC
  Administered 2011-10-07: 1 via ORAL
  Filled 2011-10-07: qty 1

## 2011-10-07 MED ORDER — CLARITHROMYCIN 500 MG PO TABS: 500.0000 mg | ORAL_TABLET | Freq: Two times a day (BID) | ORAL | Status: DC

## 2011-10-07 MED ORDER — NEOMYCIN-POLYMYXIN-HC 3.5-10000-1 OT SUSP: 4.0000 [drp] | Freq: Four times a day (QID) | OTIC | Status: DC

## 2011-10-07 NOTE — ED Notes (Signed)
Patient states that she has had sinus congestion for about a week, sore throat and ear drainage

## 2011-10-07 NOTE — ED Provider Notes (Signed)
History     CSN: [id number]  Arrival date & time 10/07/11  0825   First MD Initiated Contact with Patient 10/07/11 [phone]      Chief Complaint  Patient presents with  . Ear Drainage    (Consider location/radiation/quality/duration/timing/severity/associated sxs/prior treatment) HPI Comments: Pamela [name] is a 56 y.o. Female who presents with several days of facial congestion, and right ear drainage. She also has cough with sputum, yellow. She denies shortness of breath, chest pain, weakness, dizziness, nausea, or vomiting. Her hearing is good. She has had previous episodes of sinusitis, and bronchitis.  Patient is a 56 y.o. female presenting with ear drainage. The history is provided by the patient.  Ear Drainage    Past Medical History  Diagnosis Date  . Hypertension     History reviewed. No pertinent past surgical history.  History reviewed. No pertinent family history.  History  Substance Use Topics  . Smoking status: Former Games developer  . Smokeless tobacco: Not on file  . Alcohol Use: Yes     occasionally    OB History    Grav Para Term Preterm Abortions TAB SAB Ect Mult Living                  Review of Systems  All other systems reviewed and are negative.    Allergies  Review of patient's allergies indicates no known allergies.  Home Medications   Current Outpatient Rx  Name Route Sig Dispense Refill  . BUTALBITAL-APAP-CAFF-COD 50-325-40-30 MG PO CAPS Oral Take 1 capsule by mouth every 4 (four) hours as needed. Headache    . CETIRIZINE HCL 10 MG PO TABS Oral Take 10 mg by mouth daily.    . DEXLANSOPRAZOLE 30 MG PO CPDR Oral Take 30 mg by mouth daily.    Marland Kitchen DICLOFENAC SODIUM 1 % TD GEL Topical Apply 1 application topically 4 (four) times daily as needed. Inflammation    . LOSARTAN POTASSIUM-HCTZ 50-12.5 MG PO TABS Oral Take 1 tablet by mouth daily. 30 tablet 11  . METOCLOPRAMIDE HCL 10 MG PO TABS Oral Take 10 mg by mouth 4 (four) times daily.    .  SERTRALINE HCL 25 MG PO TABS Oral Take 25 mg by mouth daily.    Marland Kitchen CLARITHROMYCIN 500 MG PO TABS Oral Take 1 tablet (500 mg total) by mouth 2 (two) times daily. 20 tablet 0  . NEOMYCIN-POLYMYXIN-HC 3.5-10000-1 OT SUSP Right Ear Place 4 drops into the right ear 4 (four) times daily. X 7 days 10 mL 0    BP 111/67  Pulse 92  Temp 97.7 F (36.5 C) (Oral)  Resp 21  SpO2 95%  Physical Exam  Nursing note and vitals reviewed. Constitutional: She is oriented to person, place, and time. She appears well-developed and well-nourished.  HENT:  Head: Normocephalic and atraumatic.       Mild right cerebral artery canal, swelling, with a small amount of drainage. Right TM appears normal. Left external auditory canal and tympanic membrane are normal. Mild percussive tenderness over the right maxillary sinus.  No nasal drainage.  Eyes: Conjunctivae and EOM are normal. Pupils are equal, round, and reactive to light.  Neck: Normal range of motion and phonation normal. Neck supple.  Cardiovascular: Normal rate, regular rhythm and intact distal pulses.   Pulmonary/Chest: Effort normal and breath sounds normal. She exhibits no tenderness.  Abdominal: Soft.  Musculoskeletal: Normal range of motion.  Neurological: She is alert and oriented to person, place, and time. She  has normal strength. She exhibits normal muscle tone.  Skin: Skin is warm and dry.  Psychiatric: She has a normal mood and affect. Her behavior is normal. Judgment and thought content normal.    ED Course  Procedures (including critical care time)  Labs Reviewed - No data to display No results found.   1. Sinusitis   2. Otitis externa       MDM  Likely acute bacterial sinusitis. Also, an associated right external otitis. Doubt metabolic instability, serious bacterial infection or impending vascular collapse; the patient is stable for discharge.   Plan: Home Medications- Biaxin, Cortisporin; Home Treatments- rest, fluids;  Recommended follow up- PCP prn        Flint Melter, MD 10/07/11 [phone]

## 2011-10-07 NOTE — ED Notes (Signed)
MD at bedside. 

## 2011-10-16 ENCOUNTER — Other Ambulatory Visit: Payer: Self-pay | Admitting: Internal Medicine

## 2011-10-16 ENCOUNTER — Encounter: Payer: Self-pay | Admitting: Internal Medicine

## 2011-10-16 ENCOUNTER — Other Ambulatory Visit (INDEPENDENT_AMBULATORY_CARE_PROVIDER_SITE_OTHER): Payer: Medicare Other

## 2011-10-16 ENCOUNTER — Ambulatory Visit (INDEPENDENT_AMBULATORY_CARE_PROVIDER_SITE_OTHER): Payer: Medicare Other | Admitting: Internal Medicine

## 2011-10-16 VITALS — BP 128/90 | HR 104 | Temp 98.0°F | Ht 69.0 in | Wt 243.5 lb

## 2011-10-16 DIAGNOSIS — I10 Essential (primary) hypertension: Secondary | ICD-10-CM | POA: Diagnosis not present

## 2011-10-16 DIAGNOSIS — H6092 Unspecified otitis externa, left ear: Secondary | ICD-10-CM | POA: Insufficient documentation

## 2011-10-16 DIAGNOSIS — M76899 Other specified enthesopathies of unspecified lower limb, excluding foot: Secondary | ICD-10-CM

## 2011-10-16 DIAGNOSIS — H60399 Other infective otitis externa, unspecified ear: Secondary | ICD-10-CM

## 2011-10-16 DIAGNOSIS — F4321 Adjustment disorder with depressed mood: Secondary | ICD-10-CM | POA: Diagnosis not present

## 2011-10-16 LAB — URINALYSIS, ROUTINE W REFLEX MICROSCOPIC
Bilirubin Urine: NEGATIVE
Ketones, ur: NEGATIVE
Leukocytes, UA: NEGATIVE
Urobilinogen, UA: 0.2 (ref 0.0–1.0)
pH: 5 (ref 5.0–8.0)

## 2011-10-16 LAB — HEPATIC FUNCTION PANEL
AST: 16 U/L (ref 0–37)
Alkaline Phosphatase: 78 U/L (ref 39–117)
Total Bilirubin: 0.6 mg/dL (ref 0.3–1.2)

## 2011-10-16 LAB — CBC WITH DIFFERENTIAL/PLATELET
Basophils Absolute: 0.1 10*3/uL (ref 0.0–0.1)
HCT: 37.2 % (ref 36.0–46.0)
Lymphs Abs: 1.4 10*3/uL (ref 0.7–4.0)
MCV: 83.1 fl (ref 78.0–100.0)
Monocytes Absolute: 0.7 10*3/uL (ref 0.1–1.0)
Platelets: 238 10*3/uL (ref 150.0–400.0)
RDW: 13.3 % (ref 11.5–14.6)

## 2011-10-16 LAB — BASIC METABOLIC PANEL
GFR: 84.02 mL/min (ref >60.00)
Glucose, Bld: 91 mg/dL (ref 70–99)
Potassium: 3.6 mEq/L (ref 3.5–5.1)
Sodium: 139 mEq/L (ref 135–145)

## 2011-10-16 LAB — TSH: TSH: 0.9 u[IU]/mL (ref 0.35–5.50)

## 2011-10-16 LAB — LIPID PANEL
HDL: 63.1 mg/dL (ref >39.00)
Triglycerides: 124 mg/dL (ref 0.0–149.0)
VLDL: 24.8 mg/dL (ref 0.0–40.0)

## 2011-10-16 MED ORDER — HYDROCODONE-ACETAMINOPHEN 5-325 MG PO TABS: 1.0000 | ORAL_TABLET | Freq: Four times a day (QID) | ORAL | Status: DC | PRN

## 2011-10-16 MED ORDER — PREDNISONE 10 MG PO TABS: ORAL_TABLET | ORAL | Status: DC

## 2011-10-16 MED ORDER — ATORVASTATIN CALCIUM 10 MG PO TABS: 10.0000 mg | ORAL_TABLET | Freq: Every day | ORAL | Status: DC

## 2011-10-16 MED ORDER — CIPROFLOXACIN HCL 500 MG PO TABS: 500.0000 mg | ORAL_TABLET | Freq: Two times a day (BID) | ORAL | Status: AC

## 2011-10-16 MED ORDER — SERTRALINE HCL 100 MG PO TABS: 100.0000 mg | ORAL_TABLET | Freq: Every day | ORAL | Status: DC

## 2011-10-16 NOTE — Assessment & Plan Note (Signed)
Ongoing , not really sitatuional, ok for increase zoloft to 100 mg ,  to f/u any worsening symptoms or concernsl, delcines counseling or psychiatry at this time

## 2011-10-16 NOTE — Assessment & Plan Note (Signed)
stable overall by hx and exam, most recent data reviewed with pt, and pt to continue medical treatment as before BP Readings from Last 3 Encounters:  10/16/11 128/90  10/07/11 111/67  08/19/11 136/83   Due for labs today

## 2011-10-16 NOTE — Assessment & Plan Note (Signed)
At least mod persistent, for pain control, predpack asd, and refer orthopedic

## 2011-10-16 NOTE — Patient Instructions (Addendum)
Take all new medications as prescribed - the antibiotic, prednisone, pain medication Continue all other medications as before except increase the zoloft to 100 mg per day (all sent to the pharmacy) You will be contacted regarding the referral for: orthopedic Please go to LAB in the Basement for the blood and/or urine tests to be done today You will be contacted by phone if any changes need to be made immediately.  Otherwise, you will receive a letter about your results with an explanation.

## 2011-10-16 NOTE — Progress Notes (Signed)
Subjective:    Patient ID: Pamela [name], female    DOB: 03/06/1956, 56 y.o.   MRN: [id number]  HPI  Here a bit frustrated it seems over not getting better recently with several issues with Dr Bascom Levels;  Left ear ext otitis not tx per pt, and drops from ER not working out well, worse in the past wk with pain, feverish, and slight d/c, as well as mild sinusitis like symptoms as well.  Also still signicant ritht lateral hip bursitis pain not responding to tx so far, and never heard from Dr Janey Greaser office regarding referral , even though she called to check per pt.  Pt denies chest pain, increased sob or doe, wheezing, orthopnea, PND, increased LE swelling, palpitations, dizziness or syncope.  Pt denies new neurological symptoms such as new headache, or facial or extremity weakness or numbness   Pt denies polydipsia, polyuria, last labs overall dec 2011.  Last colonscopy dec 2008 with hyperplastic polyp only.  Has also had worsening depressive symptoms, but no suicidal ideation, or panic, though has ongoing anxiety, not increased recently;  The zoloft 25 mg not working well.  Cont's disabled related to neck surgury x 3 Past Medical History  Diagnosis Date  . Hypertension   . HYPERLIPIDEMIA 12/18/2006    Qualifier: History of  By: Genelle Gather CMA, [place]    . DEPRESSION, SITUATIONAL 03/15/2007    Qualifier: Diagnosis of  By: Nena Jordan   . HYPERTENSION 12/18/2006    Qualifier: Diagnosis of  By: Genelle Gather CMA, [place]    . ALLERGIC RHINITIS 12/18/2006    Qualifier: Diagnosis of  By: Genelle Gather CMA, [place]    . INSOMNIA-SLEEP DISORDER-UNSPEC 08/25/2009    Qualifier: Diagnosis of  By: Jonny Ruiz MD, Len Blalock   . RENAL CALCULUS, HX OF 12/18/2006    Qualifier: Diagnosis of  By: Genelle Gather CMA, [place]     Past Surgical History  Procedure Date  . Cervical disc surgery     x 3    reports that she has quit smoking. She has never used smokeless tobacco. She reports that she drinks alcohol. She reports that she does not use  illicit drugs. family history includes Heart disease in her father and Stroke in her mother. No Known Allergies Current Outpatient Prescriptions on File Prior to Visit  Medication Sig Dispense Refill  . butalbital-acetaminophen-caffeine (FIORICET WITH CODEINE) 50-325-40-30 MG per capsule Take 1 capsule by mouth every 4 (four) hours as needed. Headache      . cetirizine (ZYRTEC) 10 MG tablet Take 10 mg by mouth daily.      . clarithromycin (BIAXIN) 500 MG tablet Take 1 tablet (500 mg total) by mouth 2 (two) times daily.  20 tablet  0  . Dexlansoprazole (DEXILANT) 30 MG capsule Take 30 mg by mouth daily.      . diclofenac sodium (VOLTAREN) 1 % GEL Apply 1 application topically 4 (four) times daily as needed. Inflammation      . losartan-hydrochlorothiazide (HYZAAR) 50-12.5 MG per tablet Take 1 tablet by mouth daily.  30 tablet  11  . metoCLOPramide (REGLAN) 10 MG tablet Take 10 mg by mouth 4 (four) times daily.      Marland Kitchen neomycin-polymyxin-hydrocortisone (CORTISPORIN) 3.5-10000-1 otic suspension Place 4 drops into the right ear 4 (four) times daily. X 7 days  10 mL  0  . sertraline (ZOLOFT) 25 MG tablet Take 25 mg by mouth daily.       Review of Systems Review of Systems  Constitutional:  Negative for diaphoresis and unexpected weight change.  HENT: Negative for drooling and tinnitus.   Eyes: Negative for photophobia and visual disturbance.  Respiratory: Negative for choking and stridor.   Gastrointestinal: Negative for vomiting and blood in stool.  Genitourinary: Negative for hematuria and decreased urine volume.  Musculoskeletal: Negative for gait problem.  Skin: Negative for color change and wound.  Neurological: Negative for tremors and numbness.  Psychiatric/Behavioral: Negative for decreased concentration. The patient is not hyperactive.       Objective:   Physical Exam BP 128/90  Pulse 104  Temp 98 F (36.7 C) (Oral)  Ht 5\' 9"  (1.753 m)  Wt 243 lb 8 oz (110.451 kg)  BMI 35.96  kg/m2  SpO2 97% Physical Exam  VS noted Constitutional: Pt appears well-developed and well-nourished/obese.  HENT: Head: Normocephalic.  Right Ear: External ear normal.  Left Ear: External ear normal.  Left ext canal with 1-2+ swelling/tender with slight d/c Bilat tm's mild erythema.  Sinus nontender.  Pharynx mild erythema Eyes: Conjunctivae and EOM are normal. Pupils are equal, round, and reactive to light.  Neck: Normal range of motion. Neck supple.  Cardiovascular: Normal rate and regular rhythm.   Pulmonary/Chest: Effort normal and breath sounds normal.  Abd:  Soft, NT, non-distended, + BS Neurological: Pt is alert. No cranial nerve deficit.  Motor/gait intact  Skin: Skin is warm. No erythema.  No rash Right hip marked tender over greater trochanter Psychiatric: Pt behavior is normal. Thought content normal. 1-2+ nervous, depressed affect    Assessment & Plan:

## 2011-10-16 NOTE — Assessment & Plan Note (Signed)
Mild to mod, for antibx course,  to f/u any worsening symptoms or concerns - for cipro asd

## 2011-10-18 ENCOUNTER — Encounter (HOSPITAL_COMMUNITY): Payer: Self-pay | Admitting: Emergency Medicine

## 2011-10-18 ENCOUNTER — Emergency Department (HOSPITAL_COMMUNITY)
Admission: EM | Admit: 2011-10-18 | Discharge: 2011-10-18 | Disposition: A | Discharge status: Home / Self Care | Payer: Medicare Other | Attending: Emergency Medicine | Admitting: Emergency Medicine

## 2011-10-18 ENCOUNTER — Emergency Department (HOSPITAL_COMMUNITY): Payer: Medicare Other

## 2011-10-18 DIAGNOSIS — G4700 Insomnia, unspecified: Secondary | ICD-10-CM | POA: Diagnosis not present

## 2011-10-18 DIAGNOSIS — W010XXA Fall on same level from slipping, tripping and stumbling without subsequent striking against object, initial encounter: Secondary | ICD-10-CM | POA: Insufficient documentation

## 2011-10-18 DIAGNOSIS — W19XXXA Unspecified fall, initial encounter: Secondary | ICD-10-CM

## 2011-10-18 DIAGNOSIS — I10 Essential (primary) hypertension: Secondary | ICD-10-CM | POA: Diagnosis not present

## 2011-10-18 DIAGNOSIS — Z79899 Other long term (current) drug therapy: Secondary | ICD-10-CM | POA: Insufficient documentation

## 2011-10-18 DIAGNOSIS — R296 Repeated falls: Secondary | ICD-10-CM | POA: Diagnosis not present

## 2011-10-18 DIAGNOSIS — S8000XA Contusion of unspecified knee, initial encounter: Secondary | ICD-10-CM | POA: Insufficient documentation

## 2011-10-18 DIAGNOSIS — Z87891 Personal history of nicotine dependence: Secondary | ICD-10-CM | POA: Insufficient documentation

## 2011-10-18 DIAGNOSIS — E785 Hyperlipidemia, unspecified: Secondary | ICD-10-CM | POA: Insufficient documentation

## 2011-10-18 DIAGNOSIS — M25569 Pain in unspecified knee: Secondary | ICD-10-CM | POA: Diagnosis not present

## 2011-10-18 DIAGNOSIS — S8001XA Contusion of right knee, initial encounter: Secondary | ICD-10-CM

## 2011-10-18 MED ORDER — OXYCODONE-ACETAMINOPHEN 5-325 MG PO TABS
1.0000 | ORAL_TABLET | Freq: Once | ORAL | Status: AC
  Administered 2011-10-18: 1 via ORAL
  Filled 2011-10-18: qty 1

## 2011-10-18 MED ORDER — ROXICET 5-325 MG PO TABS: 1.0000 | ORAL_TABLET | ORAL | Status: AC | PRN

## 2011-10-18 NOTE — ED Notes (Signed)
Pt in x-ray at this time

## 2011-10-18 NOTE — ED Notes (Signed)
D/C instructions given to and reviewed with patient, denies questions, pt ambulatory to discharge window.

## 2011-10-18 NOTE — ED Notes (Signed)
Pt c/o right knee pain after tripping and falling on knee today; abrasion noted to right knee

## 2011-10-18 NOTE — ED Provider Notes (Signed)
History  This chart was scribed for Dione Booze, MD by Shari Heritage. The patient was seen in room TR10C/TR10C. Patient's care was started at 1440.     CSN: [id number]  Arrival date & time 10/18/11  1440   First MD Initiated Contact with Patient 10/18/11 1540      Chief Complaint  Patient presents with  . Knee Pain    The history is provided by the patient. No language interpreter was used.   Pamela [name] is a 56 y.o. female who presents to the Emergency Department complaining of moderate to severe right knee pain onset onset a few hours ago after patient slipped and fell on knee today. She rates the pain as 7/10. There are no associated symptoms. Patient has a history of HTN, hyperlipidemia and renal calculus. Her surgical history includes cervical disc surgery (x3). Patient denies any allergies. She is a former smoker.  PCP - Oliver Barre   Past Medical History  Diagnosis Date  . Hypertension   . HYPERLIPIDEMIA 12/18/2006    Qualifier: History of  By: Genelle Gather CMA, [place]    . DEPRESSION, SITUATIONAL 03/15/2007    Qualifier: Diagnosis of  By: Nena Jordan   . HYPERTENSION 12/18/2006    Qualifier: Diagnosis of  By: Genelle Gather CMA, [place]    . ALLERGIC RHINITIS 12/18/2006    Qualifier: Diagnosis of  By: Genelle Gather CMA, [place]    . INSOMNIA-SLEEP DISORDER-UNSPEC 08/25/2009    Qualifier: Diagnosis of  By: Jonny Ruiz MD, Len Blalock   . RENAL CALCULUS, HX OF 12/18/2006    Qualifier: Diagnosis of  By: Genelle Gather CMA, [place]      Past Surgical History  Procedure Date  . Cervical disc surgery     x 3    Family History  Problem Relation Age of Onset  . Stroke Mother   . Heart disease Father     History  Substance Use Topics  . Smoking status: Former Games developer  . Smokeless tobacco: Never Used  . Alcohol Use: Yes     occasionally    OB History    Grav Para Term Preterm Abortions TAB SAB Ect Mult Living                  Review of Systems  All other systems reviewed and are  negative.    Allergies  Review of patient's allergies indicates no known allergies.  Home Medications   Current Outpatient Rx  Name Route Sig Dispense Refill  . ATORVASTATIN CALCIUM 10 MG PO TABS Oral Take 10 mg by mouth daily.    Marland Kitchen BUTALBITAL-APAP-CAFF-COD 50-325-40-30 MG PO CAPS Oral Take 1 capsule by mouth every 4 (four) hours as needed. Headache    . CETIRIZINE HCL 10 MG PO TABS Oral Take 10 mg by mouth daily.    Marland Kitchen CIPROFLOXACIN HCL 500 MG PO TABS Oral Take 1 tablet (500 mg total) by mouth 2 (two) times daily. 20 tablet 0  . DEXLANSOPRAZOLE 30 MG PO CPDR Oral Take 30 mg by mouth daily.    Marland Kitchen DICLOFENAC SODIUM 1 % TD GEL Topical Apply 1 application topically 4 (four) times daily as needed. Inflammation    . HYDROCODONE-ACETAMINOPHEN 5-325 MG PO TABS Oral Take 1 tablet by mouth every 6 (six) hours as needed. For pain    . LOSARTAN POTASSIUM-HCTZ 50-12.5 MG PO TABS Oral Take 1 tablet by mouth daily.    Marland Kitchen METOCLOPRAMIDE HCL 10 MG PO TABS Oral Take 10 mg by mouth 4 (  four) times daily.    Marland Kitchen PREDNISONE 10 MG PO TABS Oral Take 10-30 mg by mouth daily. Take 3 tablets once daily for 3 days, take 2 tablets once daily for 3 days, take 1 tablet once daily for 3 days. Start 10/18/11    . SERTRALINE HCL 100 MG PO TABS Oral Take 100 mg by mouth daily.      BP 141/80  Pulse 103  Temp 97.9 F (36.6 C) (Oral)  Resp 20  SpO2 96%  Physical Exam  Constitutional: She is oriented to person, place, and time. She appears well-developed and well-nourished.  Cardiovascular: Normal rate.   No murmur heard. Pulmonary/Chest: Effort normal and breath sounds normal. No respiratory distress. She has no wheezes. She has no rales.  Musculoskeletal:       Right knee: She exhibits swelling. She exhibits normal range of motion and no effusion.       Mild swelling to anterior aspect of right knee. No effusion. No instability. Full ROM.  Neurological: She is alert and oriented to person, place, and time.    ED  Course  Procedures (including critical care time) DIAGNOSTIC STUDIES: Oxygen Saturation is 96% on room air, adequate by my interpretation.    COORDINATION OF CARE: 3:42pm- Patient informed of current plan for treatment and evaluation and agrees with plan at this time. Will administer 1 tabled of Percocet 5-325 mg and order an X-ray of right knee.  Labs Reviewed - No data to display  Dg Knee Complete 4 Views Right  10/18/2011  *RADIOLOGY REPORT*  Clinical Data: Larey Seat today with pain  RIGHT KNEE - COMPLETE 4+ VIEW  Comparison: None.  Findings: There is primarily unicompartmental degenerative joint disease involving the medial compartment where there is some loss of joint space and spurring.  No fracture is seen.  No effusion is noted.  The patella appears intact.  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  No acute abnormality.  Original Report Authenticated By: Juline Patch, M.D.     1. Fall   2. Contusion of knee, right       MDM  Fall with knee injury. Doubt fracture, but the x-rays will be obtained.  X-rays show no evidence of fracture. She is given prescription for for Percocet for pain.    I personally performed the services described in this documentation, which was scribed in my presence. The recorded information has been reviewed and considered.   Dione Booze, MD 10/20/11 [phone]

## 2011-11-02 ENCOUNTER — Other Ambulatory Visit: Payer: Self-pay | Admitting: Internal Medicine

## 2011-11-02 DIAGNOSIS — M76899 Other specified enthesopathies of unspecified lower limb, excluding foot: Secondary | ICD-10-CM | POA: Diagnosis not present

## 2011-11-02 MED ORDER — ZOLPIDEM TARTRATE 10 MG PO TABS: 10.0000 mg | ORAL_TABLET | Freq: Every evening | ORAL | Status: DC | PRN

## 2011-11-02 MED ORDER — TRAMADOL HCL 50 MG PO TABS: 50.0000 mg | ORAL_TABLET | Freq: Four times a day (QID) | ORAL | Status: DC | PRN

## 2011-11-02 NOTE — Telephone Encounter (Signed)
Tramadol done erx  [place] - Done hardcopy to robin

## 2011-11-02 NOTE — Telephone Encounter (Signed)
Caller: Anum/Patient; Phone: [phone]; Reason for Call: Please call pt for refill on Tramadol and Ambien, she uses [facility] on [address].

## 2011-11-03 NOTE — Telephone Encounter (Signed)
Faxed hardcopy to pharmacy.  Called the patient left detailed message that both medications requested have been filled to her pharmacy

## 2011-11-30 ENCOUNTER — Other Ambulatory Visit: Payer: Self-pay | Admitting: Internal Medicine

## 2011-11-30 NOTE — Telephone Encounter (Signed)
Done hardcopy to robin  

## 2011-11-30 NOTE — Telephone Encounter (Signed)
Faxed hardcopy to pharmacy. 

## 2011-12-02 ENCOUNTER — Other Ambulatory Visit: Payer: Self-pay | Admitting: Internal Medicine

## 2011-12-06 DIAGNOSIS — Z23 Encounter for immunization: Secondary | ICD-10-CM | POA: Diagnosis not present

## 2012-01-02 ENCOUNTER — Encounter: Payer: Self-pay | Admitting: Internal Medicine

## 2012-01-02 ENCOUNTER — Ambulatory Visit: Payer: Medicare Other | Admitting: Internal Medicine

## 2012-01-02 ENCOUNTER — Ambulatory Visit (INDEPENDENT_AMBULATORY_CARE_PROVIDER_SITE_OTHER): Payer: Medicare Other | Admitting: Internal Medicine

## 2012-01-02 VITALS — BP 122/80 | HR 86 | Temp 98.4°F | Ht 69.0 in | Wt 251.5 lb

## 2012-01-02 DIAGNOSIS — M26609 Unspecified temporomandibular joint disorder, unspecified side: Secondary | ICD-10-CM | POA: Diagnosis not present

## 2012-01-02 MED ORDER — HYDROCODONE-ACETAMINOPHEN 5-325 MG PO TABS: 1.0000 | ORAL_TABLET | ORAL | Status: DC | PRN

## 2012-01-02 NOTE — Patient Instructions (Addendum)
Pain at the right ear is really pain/tenderness of the right TemproMandibular Joint - where the jaw hooks on to the skull. This is advanced based on the limitation in being able to open your mouth, the fact that you can hear grinding with jaw movement and that there is a click if you open your mouth to far. This is a type of arthritis and you may have partial dislocation of the joint when it clicks.  Plan  Aspercreme - rub over the outside of the joint  Aleve (generic Naproxen sodium) two tablets twice a day - watch for any belly pain or discomfort.  You need to see your dentist who may do a panorex x-ray to look at the joint. Need to be sure there is not a bad tooth or root of a tooth contributing to the  pain. He/She may refer you to an oral surgeon.   Cut your food up small and try to eat soft foods.   Do not try to force open your mouth  For fever, increased swelling around the face you need to come back right away.  May take hydrocodone/APAP 5/325 every 4 hours if needed - the main reliever will be the aleve.   Temporomandibular Problems   Temporomandibular joint (TMJ) dysfunction means there are problems with the joint between your jaw and your skull. This is a joint lined by cartilage like other joints in your body but also has a small disc in the joint which keeps the bones from rubbing on each other. These joints are like other joints and can get inflamed (sore) from arthritis and other problems. When this joint gets sore, it can cause headaches and pain in the jaw and the face. CAUSES   Usually the arthritic types of problems are caused by soreness in the joint. Soreness in the joint can also be caused by overuse. This may come from grinding your teeth. It may also come from mis-alignment in the joint. DIAGNOSIS Diagnosis of this condition can often be made by history and exam. Sometimes your caregiver may need X-rays or an MRI scan to determine the exact cause. It may be necessary to see  your dentist to determine if your teeth and jaws are lined up correctly. TREATMENT   Most of the time this problem is not serious; however, sometimes it can persist (become chronic). When this happens medications that will cut down on inflammation (soreness) help. Sometimes a shot of cortisone into the joint will be helpful. If your teeth are not aligned it may help for your dentist to make a splint for your mouth that can help this problem. If no physical problems can be found, the problem may come from tension. If tension is found to be the cause, biofeedback or relaxation techniques may be helpful. HOME CARE INSTRUCTIONS    Later in the day, applications of ice packs may be helpful. Ice can be used in a plastic bag with a towel around it to prevent frostbite to skin. This may be used about every 2 hours for 20 to 30 minutes, as needed while awake, or as directed by your caregiver.   Only take over-the-counter or prescription medicines for pain, discomfort, or fever as directed by your caregiver.   If physical therapy was prescribed, follow your caregiver's directions.   Wear mouth appliances as directed if they were given.  Document Released: 11/22/2000 Document Revised: 05/22/2011 Document Reviewed: 03/01/2008 [facility] Patient Information 2013 [place], [place].

## 2012-01-02 NOTE — Progress Notes (Signed)
Subjective:    Patient ID: Pamela [name], female    DOB: 17-Sep-1955, 57 y.o.   MRN: [id number]  HPI Pamela [name] presents for persistent ear ache: two months ago she was seen in the [facility] (records reviewed) - diagnosed with "Swimmer's ear." She then saw Dr. Jonny Ruiz (office note reviewed) - no change in diagnosis. She continues to have a lot of pain in the area of the ear, more on the right. It hurts down the face and jaw. Eating makes it worse. She does get some relief with hydrocodone. She admits to some tinnitus. She can hear her jaw moving.   Past Medical History  Diagnosis Date  . Hypertension   . HYPERLIPIDEMIA 12/18/2006    Qualifier: History of  By: Genelle Gather CMA, [place]    . DEPRESSION, SITUATIONAL 03/15/2007    Qualifier: Diagnosis of  By: Nena Jordan   . HYPERTENSION 12/18/2006    Qualifier: Diagnosis of  By: Genelle Gather CMA, [place]    . ALLERGIC RHINITIS 12/18/2006    Qualifier: Diagnosis of  By: Genelle Gather CMA, [place]    . INSOMNIA-SLEEP DISORDER-UNSPEC 08/25/2009    Qualifier: Diagnosis of  By: Jonny Ruiz MD, Len Blalock   . RENAL CALCULUS, HX OF 12/18/2006    Qualifier: Diagnosis of  By: Genelle Gather CMA, [place]     Past Surgical History  Procedure Date  . Cervical disc surgery     x 3   Family History  Problem Relation Age of Onset  . Stroke Mother   . Heart disease Father    History   Social History  . Marital Status: Married    Spouse Name: N/A    Number of Children: N/A  . Years of Education: N/A   Occupational History  . Not on file.   Social History Main Topics  . Smoking status: Former Games developer  . Smokeless tobacco: Never Used  . Alcohol Use: Yes     occasionally  . Drug Use: No  . Sexually Active:    Other Topics Concern  . Not on file   Social History Narrative  . No narrative on file    Current Outpatient Prescriptions on File Prior to Visit  Medication Sig Dispense Refill  . atorvastatin (LIPITOR) 10 MG tablet Take 10 mg by mouth daily.      .  butalbital-acetaminophen-caffeine (FIORICET WITH CODEINE) 50-325-40-30 MG per capsule Take 1 capsule by mouth every 4 (four) hours as needed. Headache      . butalbital-acetaminophen-caffeine (FIORICET, ESGIC) 50-325-40 MG per tablet take 1 tablet by mouth twice a day  60 tablet  5  . cetirizine (ZYRTEC) 10 MG tablet Take 10 mg by mouth daily.      Marland Kitchen Dexlansoprazole (DEXILANT) 30 MG capsule Take 30 mg by mouth daily.      . diclofenac sodium (VOLTAREN) 1 % GEL Apply 1 application topically 4 (four) times daily as needed. Inflammation      . HYDROcodone-acetaminophen (NORCO/VICODIN) 5-325 MG per tablet take 1 tablet by mouth every 6 hours if needed for pain  40 tablet  1  . losartan-hydrochlorothiazide (HYZAAR) 50-12.5 MG per tablet Take 1 tablet by mouth daily.      Marland Kitchen losartan-hydrochlorothiazide (HYZAAR) 50-12.5 MG per tablet take 1 tablet by mouth once daily  30 tablet  11  . metoCLOPramide (REGLAN) 10 MG tablet Take 10 mg by mouth 4 (four) times daily.      . predniSONE (DELTASONE) 10 MG tablet Take 10-30 mg by mouth daily.  Take 3 tablets once daily for 3 days, take 2 tablets once daily for 3 days, take 1 tablet once daily for 3 days. Start 10/18/11      . sertraline (ZOLOFT) 100 MG tablet Take 100 mg by mouth daily.      Marland Kitchen zolpidem (AMBIEN) 10 MG tablet Take 1 tablet (10 mg total) by mouth at bedtime as needed for sleep.  30 tablet  5  . zolpidem (AMBIEN) 10 MG tablet Take 1 tablet (10 mg total) by mouth at bedtime as needed for sleep.  30 tablet  5      Review of Systems System review is negative for any constitutional, cardiac, pulmonary, GI or neuro symptoms or complaints other than as described in the HPI.     Objective:   Physical Exam Filed Vitals:   01/02/12 1643  BP: 122/80  Pulse: 86  Temp: 98.4 F (36.9 C)   Wt Readings from Last 3 Encounters:  01/02/12 251 lb 8 oz (114.08 kg)  10/16/11 243 lb 8 oz (110.451 kg)  08/19/11 232 lb (105.235 kg)   Gen'l- obese AA woman who  is very uncomfortable but not in acute distress HEENT- Left EAC/TM is normal; Right EAC with a narrow canal without erythema, TM is pearly. Exquisitely tender over the right TMJ with warmth to the masseter muscles. She cannot fully open her mouth due to pain. There is crepitus at the joint.        Assessment & Plan:

## 2012-01-03 DIAGNOSIS — M26609 Unspecified temporomandibular joint disorder, unspecified side: Secondary | ICD-10-CM | POA: Insufficient documentation

## 2012-01-03 NOTE — Assessment & Plan Note (Signed)
Pain at the right ear is really pain/tenderness of the right TemproMandibular Joint - where the jaw hooks on to the skull. This is advanced based on the limitation in being able to open your mouth, the fact that you can hear grinding with jaw movement and that there is a click if you open your mouth to far. This is a type of arthritis and you may have partial dislocation of the joint when it clicks.  Plan  Aspercreme - rub over the outside of the joint  Aleve (generic Naproxen sodium) two tablets twice a day - watch for any belly pain or discomfort.  You need to see your dentist who may do a panorex x-ray to look at the joint. Need to be sure there is not a bad tooth or root of a tooth contributing to the  pain. He/She may refer you to an oral surgeon.   Cut your food up small and try to eat soft foods.   Do not try to force open your mouth  For fever, increased swelling around the face you need to come back right away.  May take hydrocodone/APAP 5/325 every 4 hours if needed - the main reliever will be the aleve.

## 2012-01-15 ENCOUNTER — Other Ambulatory Visit: Payer: Self-pay | Admitting: Internal Medicine

## 2012-02-05 ENCOUNTER — Other Ambulatory Visit: Payer: Self-pay

## 2012-02-05 MED ORDER — CETIRIZINE HCL 10 MG PO TABS: 10.0000 mg | ORAL_TABLET | Freq: Every day | ORAL | Status: DC

## 2012-02-14 ENCOUNTER — Other Ambulatory Visit: Payer: Self-pay | Admitting: Internal Medicine

## 2012-02-21 ENCOUNTER — Ambulatory Visit: Payer: Medicare Other | Admitting: Internal Medicine

## 2012-02-21 ENCOUNTER — Other Ambulatory Visit: Payer: Self-pay | Admitting: Internal Medicine

## 2012-02-23 ENCOUNTER — Ambulatory Visit: Payer: Medicare Other | Admitting: Internal Medicine

## 2012-03-04 ENCOUNTER — Ambulatory Visit: Payer: Medicare Other | Admitting: Internal Medicine

## 2012-03-04 DIAGNOSIS — Z0289 Encounter for other administrative examinations: Secondary | ICD-10-CM

## 2012-03-08 ENCOUNTER — Other Ambulatory Visit: Payer: Self-pay | Admitting: Internal Medicine

## 2012-03-08 NOTE — Telephone Encounter (Signed)
Ok to Rf? 

## 2012-03-08 NOTE — Telephone Encounter (Signed)
Faxed script back to [facility]...lmb 

## 2012-03-12 ENCOUNTER — Encounter: Payer: Self-pay | Admitting: Internal Medicine

## 2012-03-12 ENCOUNTER — Ambulatory Visit (INDEPENDENT_AMBULATORY_CARE_PROVIDER_SITE_OTHER): Payer: Medicare Other | Admitting: Internal Medicine

## 2012-03-12 VITALS — BP 108/86 | HR 90 | Temp 97.4°F | Ht 69.0 in | Wt 255.0 lb

## 2012-03-12 DIAGNOSIS — H9209 Otalgia, unspecified ear: Secondary | ICD-10-CM

## 2012-03-12 DIAGNOSIS — M1712 Unilateral primary osteoarthritis, left knee: Secondary | ICD-10-CM

## 2012-03-12 MED ORDER — PREDNISONE 20 MG PO TABS: ORAL_TABLET | ORAL | Status: DC

## 2012-03-12 MED ORDER — CIPROFLOXACIN-DEXAMETHASONE 0.3-0.1 % OT SUSP: 4.0000 [drp] | Freq: Two times a day (BID) | OTIC | Status: DC

## 2012-03-12 NOTE — Progress Notes (Signed)
Subjective:    Patient ID: Pamela [name], female    DOB: December 28, 1955, 56 y.o.   MRN: [id number]  HPI  Pt presents to the clinic today with c/o left knee pain. This started 2 weeks ago. She does have a history of arthritis but has never had her knee evaluated. The pain is constant and described as a soreness. She is having difficulty walking due to the pain. She also hears a grating noise when she moves her knee. She has not taken anything for the pain. Additionally, she c/o of bilateral ear pain and drainage. She does have a history of TMJ which does cause her some referred ear pain. The drainage started back in July. They treated her for an ear infection with antibiotics, and it didn't get better. She has also tried ear drops for pain which have not helped. The drainage is white and gets worse when she tries to clean her ears out with warm water. She is c/o itching in her ears as well.  Review of Systems      Past Medical History  Diagnosis Date  . Hypertension   . HYPERLIPIDEMIA 12/18/2006    Qualifier: History of  By: Genelle Gather CMA, [place]    . DEPRESSION, SITUATIONAL 03/15/2007    Qualifier: Diagnosis of  By: Nena Jordan   . HYPERTENSION 12/18/2006    Qualifier: Diagnosis of  By: Genelle Gather CMA, [place]    . ALLERGIC RHINITIS 12/18/2006    Qualifier: Diagnosis of  By: Genelle Gather CMA, [place]    . INSOMNIA-SLEEP DISORDER-UNSPEC 08/25/2009    Qualifier: Diagnosis of  By: Jonny Ruiz MD, Len Blalock   . RENAL CALCULUS, HX OF 12/18/2006    Qualifier: Diagnosis of  By: Genelle Gather CMA, [place]      Current Outpatient Prescriptions  Medication Sig Dispense Refill  . atorvastatin (LIPITOR) 10 MG tablet Take 10 mg by mouth daily.      . butalbital-acetaminophen-caffeine (FIORICET WITH CODEINE) 50-325-40-30 MG per capsule Take 1 capsule by mouth every 4 (four) hours as needed. Headache      . butalbital-acetaminophen-caffeine (FIORICET, ESGIC) 50-325-40 MG per tablet take 1 tablet by mouth twice a day  60 tablet  5    . cetirizine (ZYRTEC) 10 MG tablet Take 1 tablet (10 mg total) by mouth daily.  30 tablet  11  . cyclobenzaprine (FLEXERIL) 5 MG tablet take 1 tablet by mouth three times a day if needed  60 tablet  1  . cyclobenzaprine (FLEXERIL) 5 MG tablet take 1 tablet by mouth three times a day if needed  60 tablet  1  . Dexlansoprazole (DEXILANT) 30 MG capsule Take 30 mg by mouth daily.      . diclofenac sodium (VOLTAREN) 1 % GEL Apply 1 application topically 4 (four) times daily as needed. Inflammation      . HYDROcodone-acetaminophen (NORCO/VICODIN) 5-325 MG per tablet take 1 tablet by mouth every 4 hours if needed for pain  40 tablet  0  . losartan-hydrochlorothiazide (HYZAAR) 50-12.5 MG per tablet Take 1 tablet by mouth daily.      Marland Kitchen losartan-hydrochlorothiazide (HYZAAR) 50-12.5 MG per tablet take 1 tablet by mouth once daily  30 tablet  11  . metoCLOPramide (REGLAN) 10 MG tablet Take 10 mg by mouth 4 (four) times daily.      . predniSONE (DELTASONE) 10 MG tablet Take 10-30 mg by mouth daily. Take 3 tablets once daily for 3 days, take 2 tablets once daily for 3 days, take 1  tablet once daily for 3 days. Start 10/18/11      . sertraline (ZOLOFT) 100 MG tablet Take 100 mg by mouth daily.      Marland Kitchen zolpidem (AMBIEN) 10 MG tablet Take 1 tablet (10 mg total) by mouth at bedtime as needed for sleep.  30 tablet  5  . zolpidem (AMBIEN) 10 MG tablet Take 1 tablet (10 mg total) by mouth at bedtime as needed for sleep.  30 tablet  5    Not on File  Family History  Problem Relation Age of Onset  . Stroke Mother   . Heart disease Father     History   Social History  . Marital Status: Married    Spouse Name: N/A    Number of Children: N/A  . Years of Education: N/A   Occupational History  . Not on file.   Social History Main Topics  . Smoking status: Former Games developer  . Smokeless tobacco: Never Used  . Alcohol Use: Yes     Comment: occasionally  . Drug Use: No  . Sexually Active:    Other Topics  Concern  . Not on file   Social History Narrative  . No narrative on file     Constitutional: Denies fever, malaise, fatigue, headache or abrupt weight changes.  HEENT: Pt reports ear pain. Denies eye pain, eye redness, ringing in the ears, wax buildup, runny nose, nasal congestion, bloody nose, or sore throat. Respiratory: Denies difficulty breathing, shortness of breath, cough or sputum production.   Musculoskeletal: Pt reports pain in her left knee and difficulty with gait. Denies decrease in range of motion, muscle pain or joint swelling.   No other specific complaints in a complete review of systems (except as listed in HPI above).  Objective:   Physical Exam   BP 108/86  Pulse 90  Temp 97.4 F (36.3 C) (Oral)  Ht 5\' 9"  (1.753 m)  Wt 255 lb (115.667 kg)  BMI 37.66 kg/m2  SpO2 97% Wt Readings from Last 3 Encounters:  03/12/12 255 lb (115.667 kg)  01/02/12 251 lb 8 oz (114.08 kg)  10/16/11 243 lb 8 oz (110.451 kg)    General: Appears her stated age, well developed, well nourished in NAD. HEENT: Head: normal shape and size; Eyes: sclera white, no icterus, conjunctiva pink, PERRLA and EOMs intact; Ears: Tm's gray and intact, normal light reflex, white coating noted on the canal with drainage present; Nose: mucosa pink and moist, septum midline; Throat/Mouth: Teeth present, mucosa pink and moist, no exudate, lesions or ulcerations noted.  Cardiovascular: Normal rate and rhythm. S1,S2 noted.  No murmur, rubs or gallops noted. No JVD or BLE edema. No carotid bruits noted. Pulmonary/Chest: Normal effort and positive vesicular breath sounds. No respiratory distress. No wheezes, rales or ronchi noted.  Musculoskeletal: Crepitus noted in the left knee with range of motion. No signs of joint swelling. + Limp.  Neurological: Alert and oriented. Cranial nerves II-XII intact. Coordination normal. +DTRs bilaterally.       Assessment & Plan:   Arthritis, left knee, new onset with  additional workup required:  Will give Prednisone taper today Will need to f/u with orthopedics regarding further treatment  Eczema of bilateral ears, new onset with additional workup required:  Will try Ciprodex ear drops Referral placed to ENT due to duration of > 6 mos with treatment failure  RTC as needed or if symptoms persist

## 2012-03-18 ENCOUNTER — Other Ambulatory Visit: Payer: Self-pay | Admitting: Internal Medicine

## 2012-03-19 ENCOUNTER — Telehealth: Payer: Self-pay | Admitting: Internal Medicine

## 2012-03-19 DIAGNOSIS — M2669 Other specified disorders of temporomandibular joint: Secondary | ICD-10-CM | POA: Diagnosis not present

## 2012-03-19 DIAGNOSIS — H9209 Otalgia, unspecified ear: Secondary | ICD-10-CM | POA: Diagnosis not present

## 2012-03-19 MED ORDER — METOCLOPRAMIDE HCL 10 MG PO TABS: 10.0000 mg | ORAL_TABLET | Freq: Four times a day (QID) | ORAL | Status: DC | PRN

## 2012-03-19 NOTE — Telephone Encounter (Signed)
Done erx 

## 2012-03-19 NOTE — Telephone Encounter (Signed)
Patient calls regarding denial of refill of generic for Reglan.  She no longer goes to Dr. Bascom Levels and is with Dr. Jonny Ruiz as her primary care thus hoping he  would continue her acid reflex medicine.  Patient feels she really needs something with the stomach bloating that she has.  If Dr. Jonny Ruiz is willing the drug store she uses is [facility] on [address] [phone]). If not please notify the patient what she needs to do for him to be comfortable following her stomach acid issues.

## 2012-03-20 ENCOUNTER — Telehealth: Payer: Self-pay | Admitting: Internal Medicine

## 2012-03-20 NOTE — Telephone Encounter (Signed)
Pt is calling for a refill of acid reflux medication.  Pt states she received a refill of the Reglan but she needs a refill of the Dexilant.  Per EPIC, Dexilant was refilled on 03/18/12 and sent to [facility] pharmacy off [address].  Pt states she will follow up with pharmacy for medication.

## 2012-03-20 NOTE — Telephone Encounter (Signed)
Called left detailed message rx sent in as requested.

## 2012-03-21 ENCOUNTER — Other Ambulatory Visit: Payer: Self-pay | Admitting: Internal Medicine

## 2012-03-27 ENCOUNTER — Other Ambulatory Visit: Payer: Self-pay | Admitting: Internal Medicine

## 2012-03-27 NOTE — Telephone Encounter (Signed)
Faxed hardcopy to pharmacy and patient agreed to schedule ROV

## 2012-03-27 NOTE — Telephone Encounter (Signed)
Faxed hardcopy to pharmacy. 

## 2012-03-27 NOTE — Telephone Encounter (Signed)
Done hardcopy to robin  Needs rov for further refills, unless she has seen orthopedic and we can get notes indicating she has need for ongoing chronic pain medication

## 2012-03-29 ENCOUNTER — Ambulatory Visit: Payer: Medicare Other | Admitting: Internal Medicine

## 2012-04-03 ENCOUNTER — Encounter: Payer: Self-pay | Admitting: Internal Medicine

## 2012-04-03 ENCOUNTER — Ambulatory Visit (INDEPENDENT_AMBULATORY_CARE_PROVIDER_SITE_OTHER): Payer: Medicare Other | Admitting: Internal Medicine

## 2012-04-03 ENCOUNTER — Other Ambulatory Visit (INDEPENDENT_AMBULATORY_CARE_PROVIDER_SITE_OTHER): Payer: Medicare Other

## 2012-04-03 VITALS — BP 110/70 | HR 94 | Temp 97.8°F | Ht 69.0 in | Wt 252.0 lb

## 2012-04-03 DIAGNOSIS — IMO0002 Reserved for concepts with insufficient information to code with codable children: Secondary | ICD-10-CM | POA: Diagnosis not present

## 2012-04-03 DIAGNOSIS — T8484XA Pain due to internal orthopedic prosthetic devices, implants and grafts, initial encounter: Secondary | ICD-10-CM

## 2012-04-03 DIAGNOSIS — M25469 Effusion, unspecified knee: Secondary | ICD-10-CM

## 2012-04-03 DIAGNOSIS — M5416 Radiculopathy, lumbar region: Secondary | ICD-10-CM

## 2012-04-03 DIAGNOSIS — E785 Hyperlipidemia, unspecified: Secondary | ICD-10-CM | POA: Diagnosis not present

## 2012-04-03 DIAGNOSIS — L259 Unspecified contact dermatitis, unspecified cause: Secondary | ICD-10-CM

## 2012-04-03 DIAGNOSIS — M25569 Pain in unspecified knee: Secondary | ICD-10-CM | POA: Diagnosis not present

## 2012-04-03 DIAGNOSIS — M25562 Pain in left knee: Secondary | ICD-10-CM

## 2012-04-03 DIAGNOSIS — M25462 Effusion, left knee: Secondary | ICD-10-CM | POA: Insufficient documentation

## 2012-04-03 DIAGNOSIS — L309 Dermatitis, unspecified: Secondary | ICD-10-CM

## 2012-04-03 DIAGNOSIS — I10 Essential (primary) hypertension: Secondary | ICD-10-CM

## 2012-04-03 HISTORY — DX: Presence of unspecified artificial knee joint: T84.84XA

## 2012-04-03 LAB — HEPATIC FUNCTION PANEL
ALT: 26 U/L (ref 0–35)
Alkaline Phosphatase: 69 U/L (ref 39–117)
Bilirubin, Direct: 0 mg/dL (ref 0.0–0.3)
Total Bilirubin: 0.5 mg/dL (ref 0.3–1.2)
Total Protein: 7.8 g/dL (ref 6.0–8.3)

## 2012-04-03 MED ORDER — FLUOCINONIDE 0.05 % EX SOLN: Freq: Two times a day (BID) | CUTANEOUS | Status: DC

## 2012-04-03 MED ORDER — HYDROCODONE-ACETAMINOPHEN 5-325 MG PO TABS: ORAL_TABLET | ORAL | Status: DC

## 2012-04-03 NOTE — Assessment & Plan Note (Signed)
To right ext ear canal/tragus - Mild to mod, for lidex prn,  to f/u any worsening symptoms or concerns

## 2012-04-03 NOTE — Assessment & Plan Note (Signed)
Also due for f/u labs on her new statin,

## 2012-04-03 NOTE — Progress Notes (Signed)
Subjective:    Patient ID: Pamela [name], female    DOB: 08-10-1955, 58 y.o.   MRN: [id number]  HPI  Here with 1-2 mo worsening medial left knee pain with effusion, limping, now near constant mod to severe pain worse to walk, better to sit, nothing else seems to make worse or better;  Has hx of remote fall to both knees but none recent, no hx of symptomatic gout, no recent fever.  No prior dx/tx/MRI for the left knee.  Had seen orthopedic for right knee with film here c/w mild DJD only and seemed to improve after.  Left knee pain now more severe, with effusion, cannot extend lower leg completely due to pain, and has a click.  Also with pain/tender to post left distal thigh at the medial/lateral ligamentous complex insertions sites it seems, but also on top of that has had indeterminate time of mild recurring left LBP without change in severity, bowel or bladder change, fever, wt loss,  worsening LE pain,  Falls, but has a ? LLE weakness (hard to say with the left knee symptoms) as well as numbness to palpate to the lateral thigh to the knee.  Has hx of cervical surgury. Also with rash to right ext ear canal and tragus, drops per derm did not help Past Medical History  Diagnosis Date  . Hypertension   . HYPERLIPIDEMIA 12/18/2006    Qualifier: History of  By: Genelle Gather CMA, [place]    . DEPRESSION, SITUATIONAL 03/15/2007    Qualifier: Diagnosis of  By: Nena Jordan   . HYPERTENSION 12/18/2006    Qualifier: Diagnosis of  By: Genelle Gather CMA, [place]    . ALLERGIC RHINITIS 12/18/2006    Qualifier: Diagnosis of  By: Genelle Gather CMA, [place]    . INSOMNIA-SLEEP DISORDER-UNSPEC 08/25/2009    Qualifier: Diagnosis of  By: Jonny Ruiz MD, Len Blalock   . RENAL CALCULUS, HX OF 12/18/2006    Qualifier: Diagnosis of  By: Genelle Gather CMA, [place]     Past Surgical History  Procedure Date  . Cervical disc surgery     x 3    reports that she has quit smoking. She has never used smokeless tobacco. She reports that she drinks alcohol. She  reports that she does not use illicit drugs. family history includes Heart disease in her father and Stroke in her mother. No Known Allergies Current Outpatient Prescriptions on File Prior to Visit  Medication Sig Dispense Refill  . atorvastatin (LIPITOR) 10 MG tablet Take 10 mg by mouth daily.      . butalbital-acetaminophen-caffeine (FIORICET, ESGIC) 50-325-40 MG per tablet take 1 tablet by mouth twice a day  60 tablet  5  . cetirizine (ZYRTEC) 10 MG tablet Take 1 tablet (10 mg total) by mouth daily.  30 tablet  11  . cyclobenzaprine (FLEXERIL) 5 MG tablet take 1 tablet by mouth three times a day if needed  60 tablet  1  . DEXILANT 60 MG capsule take 1 capsule by mouth once daily  30 capsule  PRN  . Dexlansoprazole (DEXILANT) 30 MG capsule Take 30 mg by mouth daily.      . diclofenac sodium (VOLTAREN) 1 % GEL Apply 1 application topically 4 (four) times daily as needed. Inflammation      . losartan-hydrochlorothiazide (HYZAAR) 50-12.5 MG per tablet take 1 tablet by mouth once daily  30 tablet  11  . metoCLOPramide (REGLAN) 10 MG tablet Take 1 tablet (10 mg total) by mouth 4 (four) times  daily as needed.  120 tablet  3  . sertraline (ZOLOFT) 100 MG tablet Take 100 mg by mouth daily.      Marland Kitchen zolpidem (AMBIEN) 10 MG tablet Take 1 tablet (10 mg total) by mouth at bedtime as needed for sleep.  30 tablet  5  . zolpidem (AMBIEN) 10 MG tablet Take 1 tablet (10 mg total) by mouth at bedtime as needed for sleep.  30 tablet  5   Review of Systems  Constitutional: Negative for unexpected weight change, or unusual diaphoresis  HENT: Negative for tinnitus.   Eyes: Negative for photophobia and visual disturbance.  Respiratory: Negative for choking and stridor.   Gastrointestinal: Negative for vomiting and blood in stool.  Genitourinary: Negative for hematuria and decreased urine volume.  Musculoskeletal: Negative for acute joint swelling Skin: Negative for color change and wound.  Neurological: Negative  for tremors and numbness other than noted  Psychiatric/Behavioral: Negative for decreased concentration or  hyperactivity.       Objective:   Physical Exam BP 110/70  Pulse 94  Temp 97.8 F (36.6 C) (Oral)  Ht 5\' 9"  (1.753 m)  Wt 252 lb (114.306 kg)  BMI 37.21 kg/m2  SpO2 97% VS noted,  Constitutional: Pt appears well-developed and well-nourished.  HENT: Head: NCAT.  Right Ear: External ear normal.  Left Ear: External ear normal.  Eyes: Conjunctivae and EOM are normal. Pupils are equal, round, and reactive to light.  Neck: Normal range of motion. Neck supple.  Cardiovascular: Normal rate and regular rhythm.   Pulmonary/Chest: Effort normal and breath sounds normal.  Abd:  Soft, NT, non-distended, + BS Neurological: Pt is alert. Not confused , motor 4+/5 distal LLE but not a good exam due to current extremity/joint pain I think - o/w intact except for decreased sens to LT to lateral thigh Skin: Skin is warm. No erythema. , no rash Left knee with decr ROM - unable to actively extend lower leg fully by about 10 degres, knee NT but with 1+ effusion, tender over medial joint line, as well as the bilat post/lat ligamentous complex insertion areas, neg lochman's, neg drawer, mcmurray test equivocal Psychiatric: Pt behavior is normal. Thought content normal. 1+ nervous    Assessment & Plan:

## 2012-04-03 NOTE — Assessment & Plan Note (Signed)
Incidental, milder than her knee problem, but will ask ortho to consider as well

## 2012-04-03 NOTE — Patient Instructions (Addendum)
There are no new medications today Please continue all other medications as before, and refills have been done if requested (the vicodin) Please go to the LAB in the Basement (turn left off the elevator) for the tests to be done today You will be contacted by phone if any changes need to be made immediately.  Otherwise, you will receive a letter about your results with an explanation You will be contacted regarding the referral for: orthopedic Please remember to sign up for My Chart if you have not done so, as this will be important to you in the future with finding out test results, communicating by private email, and scheduling acute appointments online when needed. Please return in 6 months, or sooner if needed

## 2012-04-03 NOTE — Assessment & Plan Note (Signed)
See left knee pain discussion

## 2012-04-03 NOTE — Assessment & Plan Note (Signed)
stable overall by history and exam, recent data reviewed with pt, and pt to continue medical treatment as before,  to f/u any worsening symptoms or concerns BP Readings from Last 3 Encounters:  04/03/12 110/70  03/12/12 108/86  01/02/12 122/80

## 2012-04-03 NOTE — Assessment & Plan Note (Addendum)
With effusion, I suspect DJD flare vs medial meniscal dz, but also with evidence for distal hamstring tendonitis as well, today for pain med, refer ortho, hold on films or MRI for now to let ortho decide  Note:  Total time for pt hx, exam, review of record with pt in the room, determination of diagnoses and plan for further eval and tx is > 40 min, with over 50% spent in coordination and counseling of patient

## 2012-04-08 DIAGNOSIS — M545 Low back pain: Secondary | ICD-10-CM | POA: Diagnosis not present

## 2012-04-08 DIAGNOSIS — M25569 Pain in unspecified knee: Secondary | ICD-10-CM | POA: Diagnosis not present

## 2012-04-09 ENCOUNTER — Telehealth: Payer: Self-pay | Admitting: Internal Medicine

## 2012-04-09 NOTE — Telephone Encounter (Signed)
Forward 3 pages from Delbert Harness to Oliver Barre for review 04/09/12 js

## 2012-04-11 ENCOUNTER — Encounter: Payer: Self-pay | Admitting: Gastroenterology

## 2012-04-11 ENCOUNTER — Encounter: Payer: Self-pay | Admitting: Cardiology

## 2012-04-11 ENCOUNTER — Encounter: Payer: Self-pay | Admitting: Pulmonary Disease

## 2012-04-18 ENCOUNTER — Ambulatory Visit: Payer: Medicare Other | Admitting: Internal Medicine

## 2012-04-22 ENCOUNTER — Other Ambulatory Visit: Payer: Self-pay | Admitting: Internal Medicine

## 2012-05-03 DIAGNOSIS — H524 Presbyopia: Secondary | ICD-10-CM | POA: Diagnosis not present

## 2012-05-11 ENCOUNTER — Other Ambulatory Visit: Payer: Self-pay | Admitting: Internal Medicine

## 2012-05-13 NOTE — Telephone Encounter (Signed)
Faxed

## 2012-05-13 NOTE — Telephone Encounter (Signed)
Refill already sent to Dr. Jonny Ruiz.

## 2012-05-13 NOTE — Telephone Encounter (Signed)
Pt last seen 04/05/12. Med last filled 11/02/11 #30 with 5 refills. Ok to fill?

## 2012-05-13 NOTE — Telephone Encounter (Signed)
Done hardcopy to robin  

## 2012-06-13 ENCOUNTER — Other Ambulatory Visit: Payer: Self-pay | Admitting: Internal Medicine

## 2012-06-13 NOTE — Telephone Encounter (Signed)
ffaxed hardcopy to [facility]

## 2012-06-13 NOTE — Telephone Encounter (Signed)
Done hardcopy to robin  

## 2012-06-18 ENCOUNTER — Other Ambulatory Visit: Payer: Self-pay | Admitting: Internal Medicine

## 2012-06-19 ENCOUNTER — Other Ambulatory Visit: Payer: Self-pay | Admitting: Internal Medicine

## 2012-06-30 ENCOUNTER — Other Ambulatory Visit: Payer: Self-pay | Admitting: Internal Medicine

## 2012-07-23 ENCOUNTER — Other Ambulatory Visit: Payer: Self-pay | Admitting: Internal Medicine

## 2012-07-29 ENCOUNTER — Other Ambulatory Visit: Payer: Self-pay | Admitting: Internal Medicine

## 2012-07-29 NOTE — Telephone Encounter (Signed)
Done hardcopy to robin  

## 2012-07-29 NOTE — Telephone Encounter (Signed)
Hydrocodone called to pharmacy  

## 2012-08-30 ENCOUNTER — Other Ambulatory Visit (HOSPITAL_COMMUNITY): Payer: Self-pay | Admitting: Chiropractic Medicine

## 2012-09-09 ENCOUNTER — Other Ambulatory Visit: Payer: Self-pay | Admitting: Internal Medicine

## 2012-09-10 ENCOUNTER — Encounter: Payer: Self-pay | Admitting: Internal Medicine

## 2012-09-10 ENCOUNTER — Telehealth: Payer: Self-pay | Admitting: *Deleted

## 2012-09-10 ENCOUNTER — Ambulatory Visit (INDEPENDENT_AMBULATORY_CARE_PROVIDER_SITE_OTHER): Payer: Medicare Other | Admitting: Internal Medicine

## 2012-09-10 VITALS — BP 110/86 | HR 86 | Temp 98.3°F | Ht 69.0 in | Wt 230.0 lb

## 2012-09-10 DIAGNOSIS — M1710 Unilateral primary osteoarthritis, unspecified knee: Secondary | ICD-10-CM | POA: Diagnosis not present

## 2012-09-10 DIAGNOSIS — M1712 Unilateral primary osteoarthritis, left knee: Secondary | ICD-10-CM | POA: Insufficient documentation

## 2012-09-10 DIAGNOSIS — I10 Essential (primary) hypertension: Secondary | ICD-10-CM

## 2012-09-10 DIAGNOSIS — F4321 Adjustment disorder with depressed mood: Secondary | ICD-10-CM

## 2012-09-10 MED ORDER — HYDROCODONE-ACETAMINOPHEN 5-325 MG PO TABS: ORAL_TABLET | ORAL | Status: DC

## 2012-09-10 MED ORDER — TRAMADOL HCL 50 MG PO TABS: 50.0000 mg | ORAL_TABLET | Freq: Three times a day (TID) | ORAL | Status: DC | PRN

## 2012-09-10 NOTE — Telephone Encounter (Signed)
Pt called requesting an additional medication to go along with the Tramadol Rx.  States the Tramadol alone is not enough

## 2012-09-10 NOTE — Patient Instructions (Signed)
Please take all new medication as prescribed - the pain medication  Please continue all other medications as before, and refills have been done if requested.  Please have the pharmacy call with any other refills you may need.  Please keep your appointments with your specialists as you may have planned   

## 2012-09-10 NOTE — Telephone Encounter (Signed)
Called the patient left message to call back 

## 2012-09-10 NOTE — Assessment & Plan Note (Signed)
prob mod to severe, urged pt to return to ortho for further cortisone but declines now, for tramadol prn,  to f/u any worsening symptoms or concerns

## 2012-09-10 NOTE — Telephone Encounter (Signed)
Called the patient informed of MD instructions on medication.  Faxed hardcopy to [facility] as requested.

## 2012-09-10 NOTE — Progress Notes (Addendum)
Subjective:    Patient ID: Pamela [name], female    DOB: 06-23-1955, 57 y.o.   MRN: [id number]  HPI Here with ongoing left leg pain, specifically left knee, mod to severe, worse x 2 mo, achy, anterioor, with swelling, tendency for mild giveaway, no falls or trauma. No fever/  Has seen ortho with cortisone shot which helped but trying not to return as she didn't like having this shot done.  Was rec'd for possible surgury, but she is putting this off. Pt denies chest pain, increased sob or doe, wheezing, orthopnea, PND, increased LE swelling, palpitations, dizziness or syncope.   Pt denies polydipsia, polyuria. Pt denies new neurological symptoms such as new headache, or facial or extremity weakness or numbness. Hard to lose wt with knee pain. Denies worsening depressive symptoms, suicidal ideation, or panic; has ongoing anxiety, not increased recently.  Past Medical History  Diagnosis Date  . Hypertension   . HYPERLIPIDEMIA 12/18/2006    Qualifier: History of  By: Genelle Gather CMA, [place]    . DEPRESSION, SITUATIONAL 03/15/2007    Qualifier: Diagnosis of  By: Nena Jordan   . HYPERTENSION 12/18/2006    Qualifier: Diagnosis of  By: Genelle Gather CMA, [place]    . ALLERGIC RHINITIS 12/18/2006    Qualifier: Diagnosis of  By: Genelle Gather CMA, [place]    . INSOMNIA-SLEEP DISORDER-UNSPEC 08/25/2009    Qualifier: Diagnosis of  By: Jonny Ruiz MD, Len Blalock   . RENAL CALCULUS, HX OF 12/18/2006    Qualifier: Diagnosis of  By: Genelle Gather CMA, [place]     Past Surgical History  Procedure Laterality Date  . Cervical disc surgery      x 3    reports that she has quit smoking. She has never used smokeless tobacco. She reports that  drinks alcohol. She reports that she does not use illicit drugs. family history includes Heart disease in her father and Stroke in her mother. No Known Allergies Current Outpatient Prescriptions on File Prior to Visit  Medication Sig Dispense Refill  . atorvastatin (LIPITOR) 10 MG tablet Take 10 mg by  mouth daily.      . butalbital-acetaminophen-caffeine (FIORICET, ESGIC) 50-325-40 MG per tablet take 1 tablet by mouth twice a day  60 tablet  5  . cetirizine (ZYRTEC) 10 MG tablet Take 1 tablet (10 mg total) by mouth daily.  30 tablet  11  . cyclobenzaprine (FLEXERIL) 5 MG tablet take 1 tablet by mouth three times a day if needed  60 tablet  6  . DEXILANT 60 MG capsule take 1 capsule by mouth once daily  30 capsule  PRN  . Dexlansoprazole (DEXILANT) 30 MG capsule Take 30 mg by mouth daily.      . fluocinonide (LIDEX) 0.05 % external solution Apply topically 2 (two) times daily.  60 mL  0  . losartan-hydrochlorothiazide (HYZAAR) 50-12.5 MG per tablet take 1 tablet by mouth once daily  30 tablet  11  . metoCLOPramide (REGLAN) 10 MG tablet Take 1 tablet (10 mg total) by mouth 4 (four) times daily as needed.  120 tablet  3  . sertraline (ZOLOFT) 100 MG tablet Take 100 mg by mouth daily.      . VOLTAREN 1 % GEL apply 2 TO 4 TIMES A DAY if needed  100 g  PRN  . zolpidem (AMBIEN) 10 MG tablet take 1 tablet by mouth at bedtime if needed for sleep  30 tablet  5  . traMADol (ULTRAM) 50 MG tablet take 1  tablet by mouth every 6 hours if needed for pain  60 tablet  0  . zolpidem (AMBIEN) 10 MG tablet Take 1 tablet (10 mg total) by mouth at bedtime as needed for sleep.  30 tablet  5  . zolpidem (AMBIEN) 10 MG tablet Take 1 tablet (10 mg total) by mouth at bedtime as needed for sleep.  30 tablet  5   No current facility-administered medications on file prior to visit.   Review of Systems  Constitutional: Negative for unexpected weight change, or unusual diaphoresis  HENT: Negative for tinnitus.   Eyes: Negative for photophobia and visual disturbance.  Respiratory: Negative for choking and stridor.   Gastrointestinal: Negative for vomiting and blood in stool.  Genitourinary: Negative for hematuria and decreased urine volume.  Musculoskeletal: Negative for acute joint swelling Skin: Negative for color  change and wound.  Neurological: Negative for tremors and numbness other than noted  Psychiatric/Behavioral: Negative for decreased concentration or  hyperactivity.       Objective:   Physical Exam BP 110/86  Pulse 86  Temp(Src) 98.3 F (36.8 C) (Oral)  Ht 5\' 9"  (1.753 m)  Wt 230 lb (104.327 kg)  BMI 33.95 kg/m2  SpO2 96% VS noted,  Constitutional: Pt appears well-developed and well-nourished.  HENT: Head: NCAT.  Right Ear: External ear normal.  Left Ear: External ear normal.  Eyes: Conjunctivae and EOM are normal. Pupils are equal, round, and reactive to light.  Neck: Normal range of motion. Neck supple.  Cardiovascular: Normal rate and regular rhythm.   Pulmonary/Chest: Effort normal and breath sounds normal.  Left knee NT but 1-2+ effusion, decreased ROM, mild tender to medial joint line, + crepitus, neg lachmans Neurological: Pt is alert. Not confused  Skin: Skin is warm. No erythema.  Psychiatric: Pt behavior is normal. Thought content normal. mild nervous, not depressed affect    Assessment & Plan:  Quality Measures addressed:  Mammogram:  pt declines and will self-refer

## 2012-09-10 NOTE — Telephone Encounter (Signed)
Ok, but do not take with the tramadol, and should only use for "breakthrough" pain

## 2012-09-13 NOTE — Assessment & Plan Note (Signed)
stable overall by history and exam, recent data reviewed with pt, and pt to continue medical treatment as before,  to f/u any worsening symptoms or concerns Lab Results  Component Value Date   WBC 7.6 10/16/2011   HGB 12.1 10/16/2011   HCT 37.2 10/16/2011   PLT 238.0 10/16/2011   GLUCOSE 91 10/16/2011   CHOL 203* 04/03/2012   TRIG 140.0 04/03/2012   HDL 60.80 04/03/2012   LDLDIRECT 109.9 04/03/2012   LDLCALC DEL 12/19/2006   ALT 26 04/03/2012   AST 20 04/03/2012   NA 139 10/16/2011   K 3.6 10/16/2011   CL 107 10/16/2011   CREATININE 0.9 10/16/2011   BUN 19 10/16/2011   CO2 24 10/16/2011   TSH 0.90 10/16/2011   INR 1.0 06/29/2008

## 2012-09-13 NOTE — Assessment & Plan Note (Signed)
stable overall by history and exam, recent data reviewed with pt, and pt to continue medical treatment as before,  to f/u any worsening symptoms or concerns BP Readings from Last 3 Encounters:  09/10/12 110/86  04/03/12 110/70  03/12/12 108/86

## 2012-09-30 ENCOUNTER — Ambulatory Visit: Payer: Medicare Other | Admitting: Internal Medicine

## 2012-09-30 ENCOUNTER — Telehealth: Payer: Self-pay | Admitting: *Deleted

## 2012-09-30 DIAGNOSIS — Z0289 Encounter for other administrative examinations: Secondary | ICD-10-CM

## 2012-09-30 MED ORDER — PANTOPRAZOLE SODIUM 40 MG PO TBEC: 40.0000 mg | DELAYED_RELEASE_TABLET | Freq: Every day | ORAL | Status: DC

## 2012-09-30 NOTE — Telephone Encounter (Signed)
Pt called states she is having Acid Reflux, she is requesting an Rx similar to Dexilant that insurance will pay for.  Please advise

## 2012-09-30 NOTE — Telephone Encounter (Signed)
Patient informed. 

## 2012-09-30 NOTE — Telephone Encounter (Signed)
Ok to try change to protonix 40 qd - done erx 

## 2012-10-01 DIAGNOSIS — M545 Low back pain, unspecified: Secondary | ICD-10-CM | POA: Diagnosis not present

## 2012-10-01 DIAGNOSIS — IMO0002 Reserved for concepts with insufficient information to code with codable children: Secondary | ICD-10-CM | POA: Diagnosis not present

## 2012-10-01 DIAGNOSIS — M1710 Unilateral primary osteoarthritis, unspecified knee: Secondary | ICD-10-CM | POA: Diagnosis not present

## 2012-10-23 ENCOUNTER — Other Ambulatory Visit: Payer: Self-pay | Admitting: Internal Medicine

## 2012-10-23 NOTE — Telephone Encounter (Signed)
Done hardcopy to robin  

## 2012-10-24 ENCOUNTER — Other Ambulatory Visit: Payer: Self-pay | Admitting: Internal Medicine

## 2012-10-24 NOTE — Telephone Encounter (Signed)
Faxed hardcopy to [address] [place]

## 2012-10-24 NOTE — Telephone Encounter (Signed)
Faxed hardcopy to [facility]. 

## 2012-10-24 NOTE — Telephone Encounter (Signed)
Done hardcopy to robin  

## 2012-11-04 ENCOUNTER — Other Ambulatory Visit: Payer: Self-pay | Admitting: Internal Medicine

## 2012-11-05 ENCOUNTER — Other Ambulatory Visit: Payer: Self-pay | Admitting: Internal Medicine

## 2012-11-05 NOTE — Telephone Encounter (Signed)
Done hardcopy to robin  

## 2012-11-05 NOTE — Telephone Encounter (Signed)
Faxed hardcopy to [facility] [address] [facility] [place] 

## 2012-11-09 DIAGNOSIS — Z23 Encounter for immunization: Secondary | ICD-10-CM | POA: Diagnosis not present

## 2012-11-13 ENCOUNTER — Other Ambulatory Visit: Payer: Self-pay | Admitting: Internal Medicine

## 2012-12-04 ENCOUNTER — Telehealth: Payer: Self-pay | Admitting: Internal Medicine

## 2012-12-04 MED ORDER — SERTRALINE HCL 100 MG PO TABS: ORAL_TABLET | ORAL | Status: DC

## 2012-12-04 MED ORDER — ALPRAZOLAM 0.25 MG PO TABS: 0.2500 mg | ORAL_TABLET | Freq: Two times a day (BID) | ORAL | Status: DC | PRN

## 2012-12-04 NOTE — Telephone Encounter (Signed)
Patient Information:  Caller Name: Cadyn  Phone: [phone]  Patient: Pamela [name]  Gender: Female  DOB: 02-17-1956  Age: 57 Years  PCP: Oliver Barre (Adults only)  Office Follow Up:  Does the office need to follow up with this patient?: Yes  Instructions For The Office: requesting anti-anxiety medicaton; please call back  RN Note:  Two family members are near death and ongoing issues with husband.  [facility] on [address]. Please call back.  Symptoms  Reason For Call & Symptoms: Intermittent crying spells, stress and emotional "stuff." Called to request  something be called in "for my nerves."  In the past Diazepam worked well.  Just received Ambien refill.  Taking Zoloft daily but it is not helping.  Reviewed Health History In EMR: Yes  Reviewed Medications In EMR: Yes  Reviewed Allergies In EMR: Yes  Reviewed Surgeries / Procedures: Yes  Date of Onset of Symptoms: 11/13/2012  Guideline(s) Used:  Depression  Disposition Per Guideline:   Discuss with PCP and Callback by Nurse Today  Reason For Disposition Reached:   Started on anti-depressant medications > 2 weeks ago and not feeling any better  Advice Given:  Suggestions for Healthy Living  Eat healthy: Eat a well-balanced diet.  Get more sleep: Most people need 7-8 hours of sleep each night. Being well-rested improves your attitude and your sense of physical well-being.  Communicate: Share how you are feeling with someone in your life who is a good listener. Make certain that your spouse, family, or friends know how you are feeling.  Exercise regularly: Take a daily walk.  Avoid alcohol.  Stay Active  Get out of your house or apartment periodically. Go on an outing with a family member or a friend. Go to the store. Go to a movie.  Become involved in community activities (e.g., church, school, clubs, parent teacher associations).  Start a new hobby.  Take a daily walk.  Call Back If:  Sadness or depression symptoms  persist more than 2 weeks  You want to talk with a counselor  You feel like harming yourself  You become worse.  Patient Will Follow Care Advice:  YES

## 2012-12-04 NOTE — Telephone Encounter (Signed)
Ok for incr zoloft to 200 qd, also xanax Done hardcopy to [facility]

## 2012-12-05 NOTE — Telephone Encounter (Signed)
Patient informed of increase and xanax sent to [facility] [place]

## 2012-12-18 ENCOUNTER — Other Ambulatory Visit: Payer: Self-pay | Admitting: Internal Medicine

## 2012-12-18 NOTE — Telephone Encounter (Signed)
Done hardcopy to robin  

## 2012-12-18 NOTE — Telephone Encounter (Signed)
Faxed hardcopy to [facility] [place] 

## 2012-12-24 ENCOUNTER — Telehealth: Payer: Self-pay | Admitting: *Deleted

## 2012-12-24 NOTE — Telephone Encounter (Signed)
Pt called requesting pain medication for knee and hip pain.  Please advise

## 2012-12-25 ENCOUNTER — Telehealth: Payer: Self-pay | Admitting: *Deleted

## 2012-12-25 DIAGNOSIS — M25569 Pain in unspecified knee: Secondary | ICD-10-CM

## 2012-12-25 DIAGNOSIS — M25559 Pain in unspecified hip: Secondary | ICD-10-CM

## 2012-12-25 MED ORDER — HYDROCODONE-ACETAMINOPHEN 5-325 MG PO TABS: ORAL_TABLET | ORAL | Status: DC

## 2012-12-25 NOTE — Telephone Encounter (Signed)
Called the patient informed to pickup hardcopy at the front desk.  The patient would like a referral for appt. With Dr. Katrinka Blazing.

## 2012-12-25 NOTE — Telephone Encounter (Signed)
Pt called requesting refill on Hydrocodone for hip and knee pain.  Please advise

## 2012-12-25 NOTE — Telephone Encounter (Signed)
Done hardcopy to robin  Also, can I refer to Dr Katrinka Blazing  - our sports med MD for hip and knee pain?

## 2012-12-31 ENCOUNTER — Ambulatory Visit: Payer: Medicare Other | Admitting: Family Medicine

## 2013-01-06 ENCOUNTER — Other Ambulatory Visit: Payer: Self-pay | Admitting: Internal Medicine

## 2013-01-06 ENCOUNTER — Other Ambulatory Visit (INDEPENDENT_AMBULATORY_CARE_PROVIDER_SITE_OTHER): Payer: Medicare Other

## 2013-01-06 ENCOUNTER — Ambulatory Visit (INDEPENDENT_AMBULATORY_CARE_PROVIDER_SITE_OTHER): Payer: Medicare Other | Admitting: Family Medicine

## 2013-01-06 ENCOUNTER — Encounter: Payer: Self-pay | Admitting: Family Medicine

## 2013-01-06 VITALS — BP 128/84 | HR 84 | Wt 234.0 lb

## 2013-01-06 DIAGNOSIS — M25559 Pain in unspecified hip: Secondary | ICD-10-CM | POA: Diagnosis not present

## 2013-01-06 DIAGNOSIS — M25551 Pain in right hip: Secondary | ICD-10-CM

## 2013-01-06 DIAGNOSIS — M7061 Trochanteric bursitis, right hip: Secondary | ICD-10-CM | POA: Insufficient documentation

## 2013-01-06 DIAGNOSIS — M76899 Other specified enthesopathies of unspecified lower limb, excluding foot: Secondary | ICD-10-CM | POA: Diagnosis not present

## 2013-01-06 MED ORDER — MELOXICAM 15 MG PO TABS: 15.0000 mg | ORAL_TABLET | Freq: Every day | ORAL | Status: DC

## 2013-01-06 NOTE — Assessment & Plan Note (Signed)
Injection as stated above Home exercise program given Discussed anatomy, physiology in our rehabilitation can help prognosis. Discussed the importance of weight loss as well as hip abductor strengthening. Will do short burst of anti-inflammatories Discussed icing protocol Return in 4 weeks, may need to repeat injection if recurs again and we'll do ultrasound at that time. 

## 2013-01-06 NOTE — Patient Instructions (Signed)
Very nice to meet you.  You have greater trochanteric bursitis Look at handout.  Most important one is the hip abductors.  Lay on side lift top leg 6 inches off ground, hold 2 second, down slow for 4 30 reps 1 set daily for 1 week, then 2 sets daily for 1 week then 3 sets daily thereafter.  Watch your weight as well.  Keep wearing good shoes.  Meloxicam daily for 10 days then as needed Ice 20 minutes 1-2 times  A day can be helpful Sleeping with a pillow between legs Come back in 4 weeks.

## 2013-01-06 NOTE — Progress Notes (Signed)
I'm seeing this patient by the request  of:  Oliver Barre, MD   CC: Right hip pain  HPI: Onset: Patient is a 57 year old female coming in with right hip pain. Patient states that she has had this pain intermittently over the course last 2 years. Patient states the last 2 months though it seems to be constant and worsening. Patient finds it very difficult annulated secondary to pain. Patient states that the pain is all on the lateral aspect of the hip and denies any groin pain. Patient denies any radiation down the leg, denies any back pain. Patient has been trying hydrocodone and tramadol with no improvement. Patient has change shoes which he thinks has helped somewhat. Patient denies any fevers or chills. Patient was the severity of 9/10.  Past medical, surgical, family and social history reviewed. Medications reviewed all in the electronic medical record.   Review of Systems: No headache, visual changes, nausea, vomiting, diarrhea, constipation, dizziness, abdominal pain, skin rash, fevers, chills, night sweats, weight loss, swollen lymph nodes, body aches, joint swelling, muscle aches, chest pain, shortness of breath, mood changes.   Objective:    Blood pressure 128/84, pulse 84, weight 234 lb (106.142 kg), SpO2 96.00%.   General: No apparent distress alert and oriented x3 mood and affect normal, dressed appropriately. Obese HEENT: Pupils equal, extraocular movements intact Respiratory: Patient's speak in full sentences and does not appear short of breath Cardiovascular: No lower extremity edema, non tender, no erythema Skin: Warm dry intact with no signs of infection or rash on extremities or on axial skeleton. Abdomen: Soft nontender Neuro: Cranial nerves II through XII are intact, neurovascularly intact in all extremities with 2+ DTRs and 2+ pulses. Lymph: No lymphadenopathy of posterior or anterior cervical chain or axillae bilaterally.  Gait normal with good balance and coordination.   MSK: Non tender with full range of motion and good stability and symmetric strength and tone of shoulders, elbows, wrist, knee and ankles bilaterally.  Hip: right ROM IR: 45 Deg, ER: 45 Deg, Flexion: 120 Deg, Extension: 100 Deg, Abduction: 45 Deg, Adduction: 45 Deg Strength IR: 5/5, ER: 5/5, Flexion: 5/5, Extension: 5/5, Abduction: 3/5, Adduction: 5/5 Pelvic alignment unremarkable to inspection and palpation. Standing hip rotation and gait without trendelenburg sign / unsteadiness. Greater trochanter severely tender to palpation No tenderness over piriformis and greater trochanter. No pain with FABER or FADIR. No SI joint tenderness and normal minimal SI movement.  Previous imaging there was reviewed by me today. 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  Also August 14, 2011 patient did have complete right hip x-rays. These were reviewed by me today. X-rays show that patient had very mild degenerative changes at that time.   muscular skeletal ultrasound was performed and interpreted by Antoine Primas, M  Limited ultrasound of the right hip shows the patient does have significant greater trochanteric bursa that is inflamed and irritated. There is increased Doppler flow in significant hypoechoic changes just superficial to the greater trochanteric area. This does correspond very well with the bursitis the no true tear appreciated in the IT band or the gluteus medius tendon.  Impression: Greater trochanteric bursitis  Procedure note After verbal and written consent given pt was prepped with betadine.  1:3 kenalog 40mg /dL to Marcaine 5.6% used in right greater trochanteric bursae.  Ultrasound guidance was used secondary to body habitus of this patient.  Pt minimal bleeding dressed  with band aid, Pt given red flags to look for pt had better pain control immediatly.       Impression and  Recommendations:     This case required medical decision making of moderate complexity.

## 2013-01-10 ENCOUNTER — Other Ambulatory Visit: Payer: Self-pay | Admitting: Internal Medicine

## 2013-01-10 NOTE — Telephone Encounter (Signed)
Faxed hardcopy to [facility]. 

## 2013-01-10 NOTE — Telephone Encounter (Signed)
Done hardcopy to robin  

## 2013-01-20 ENCOUNTER — Ambulatory Visit (INDEPENDENT_AMBULATORY_CARE_PROVIDER_SITE_OTHER): Payer: Medicare Other | Admitting: Nurse Practitioner

## 2013-01-20 VITALS — BP 140/84 | HR 87 | Temp 99.6°F | Wt 236.0 lb

## 2013-01-20 DIAGNOSIS — H5710 Ocular pain, unspecified eye: Secondary | ICD-10-CM

## 2013-01-20 DIAGNOSIS — H5711 Ocular pain, right eye: Secondary | ICD-10-CM

## 2013-01-20 NOTE — Patient Instructions (Signed)
I have referred you to opthalmology for an in-depth eye exam. Pleasure to meet you.

## 2013-01-20 NOTE — Progress Notes (Signed)
Pre-visit discussion using our clinic review tool. No additional management support is needed unless otherwise documented below in the visit note.  

## 2013-01-21 DIAGNOSIS — H5710 Ocular pain, unspecified eye: Secondary | ICD-10-CM | POA: Diagnosis not present

## 2013-01-21 NOTE — Progress Notes (Signed)
  Subjective:    Patient ID: Pamela [name], female    DOB: 1955/07/04, 57 y.o.   MRN: [id number]  Eye Pain  The right eye is affected.This is a new problem. The current episode started today. The problem occurs constantly. The problem has been unchanged. There was no injury mechanism. The pain is moderate. There is no known exposure to pink eye. She does not wear contacts. Associated symptoms include blurred vision, an eye discharge (mild crust this am when awoke. clear drainage), a foreign body sensation and photophobia. Pertinent negatives include no double vision, eye redness, fever, itching, nausea, recent URI or vomiting. She has tried nothing for the symptoms.      Review of Systems  Constitutional: Negative for fever, chills, activity change, appetite change and fatigue.  HENT: Negative for congestion, postnasal drip and sore throat.   Eyes: Positive for blurred vision, photophobia, pain, discharge (mild crust this am when awoke. clear drainage) and visual disturbance. Negative for double vision, redness and itching.  Respiratory: Negative for cough.   Cardiovascular: Negative for chest pain.  Gastrointestinal: Negative for nausea, vomiting and abdominal pain.  Skin: Negative for color change, itching and rash.  Neurological: Positive for headaches (pain behind r eye).       Objective:   Physical Exam  Vitals reviewed. Constitutional: She is oriented to person, place, and time. She appears well-developed. No distress.  HENT:  Head: Normocephalic and atraumatic.  Eyes: Lids are everted and swept, no foreign bodies found. Right eye exhibits discharge (watery tearing) and hordeolum (swollen lesion outer corner of inner upper lid). Right eye exhibits no chemosis and no exudate. No foreign body present in the right eye. Left eye exhibits no chemosis, no discharge, no exudate and no hordeolum. No foreign body present in the left eye. Right conjunctiva is injected (mild). Right conjunctiva  has no hemorrhage. Left conjunctiva is not injected. Left conjunctiva has no hemorrhage. Right eye exhibits normal extraocular motion and no nystagmus. Left eye exhibits normal extraocular motion and no nystagmus. Right pupil is round and reactive (sluggish). Left pupil is round and reactive. Pupils are unequal.  Neck: Normal range of motion. Neck supple. No thyromegaly present.  Cardiovascular: Normal rate.   Pulmonary/Chest: Effort normal.  Musculoskeletal:  Normal gait  Lymphadenopathy:    She has no cervical adenopathy.  Neurological: She is alert and oriented to person, place, and time.  Skin: Skin is warm and dry.  Psychiatric: She has a normal mood and affect. Her behavior is normal. Thought content normal.          Assessment & Plan:  1. Acute right eye pain Profuse tearing, pain, blurred vision-possibly die to tearing, mild injection, swollen lesion upper outer inner lid-looks like chalazion, but does not explain level of pain & tearing. Vision screen appears nml for her-R 20/200 uncorrected, 20/40 corrected. - Ambulatory referral to Ophthalmology

## 2013-01-28 ENCOUNTER — Ambulatory Visit: Payer: Medicare Other | Admitting: Internal Medicine

## 2013-02-04 ENCOUNTER — Telehealth: Payer: Self-pay | Admitting: *Deleted

## 2013-02-04 ENCOUNTER — Ambulatory Visit: Payer: Medicare Other | Admitting: Family Medicine

## 2013-02-04 ENCOUNTER — Ambulatory Visit: Payer: Medicare Other | Admitting: Internal Medicine

## 2013-02-04 MED ORDER — HYDROCODONE-ACETAMINOPHEN 5-325 MG PO TABS: ORAL_TABLET | ORAL | Status: DC

## 2013-02-04 NOTE — Telephone Encounter (Signed)
Done hardcopy to robin, but also to let pt know:    You are given the letter today explaining the transitional pain medication refill policy due to recent [place] law change and [place] [facility] regulations  Please be aware that I will no longer be able to offer monthly refills of any Schedule II or higher medication starting Apr 13, 2013

## 2013-02-04 NOTE — Telephone Encounter (Signed)
Pt called requesting Hydrocodone refill.  Please advise 

## 2013-02-05 NOTE — Telephone Encounter (Signed)
Spoke with pt advised Rx ready for pick up 

## 2013-02-14 ENCOUNTER — Other Ambulatory Visit: Payer: Self-pay

## 2013-02-14 MED ORDER — ALPRAZOLAM 0.25 MG PO TABS: ORAL_TABLET | ORAL | Status: DC

## 2013-02-14 NOTE — Telephone Encounter (Signed)
Done hardcopy to robin  

## 2013-02-14 NOTE — Telephone Encounter (Signed)
Faxed hardcopy to [facility]. 

## 2013-02-21 ENCOUNTER — Other Ambulatory Visit: Payer: Self-pay | Admitting: Internal Medicine

## 2013-02-21 NOTE — Telephone Encounter (Signed)
Done hardcopy to robin  

## 2013-02-21 NOTE — Telephone Encounter (Signed)
Faxed hardcopy to [facility] [place] 

## 2013-03-07 ENCOUNTER — Other Ambulatory Visit: Payer: Self-pay | Admitting: Internal Medicine

## 2013-03-25 DIAGNOSIS — I10 Essential (primary) hypertension: Secondary | ICD-10-CM | POA: Diagnosis not present

## 2013-03-25 DIAGNOSIS — F411 Generalized anxiety disorder: Secondary | ICD-10-CM | POA: Diagnosis not present

## 2013-03-25 DIAGNOSIS — G4700 Insomnia, unspecified: Secondary | ICD-10-CM | POA: Diagnosis not present

## 2013-03-25 DIAGNOSIS — F3289 Other specified depressive episodes: Secondary | ICD-10-CM | POA: Diagnosis not present

## 2013-03-26 DIAGNOSIS — I10 Essential (primary) hypertension: Secondary | ICD-10-CM | POA: Diagnosis not present

## 2013-03-26 DIAGNOSIS — M545 Low back pain, unspecified: Secondary | ICD-10-CM | POA: Diagnosis not present

## 2013-03-27 ENCOUNTER — Telehealth: Payer: Self-pay | Admitting: *Deleted

## 2013-03-27 MED ORDER — HYDROCODONE-ACETAMINOPHEN 5-325 MG PO TABS: ORAL_TABLET | ORAL | Status: DC

## 2013-03-27 NOTE — Telephone Encounter (Signed)
Pt phoned requesting refill for her hydrocodone.  Her last OV with PCP (John) 09/10/2012, last OV with Z. Tamala Julian 01/06/13.  Please advise.   CB# [phone]

## 2013-03-27 NOTE — Telephone Encounter (Signed)
Called the patient informed hardcopy's are ready for pickup at the front desk.  Also explained enclosed letter regarding pain medication change in refill policy.

## 2013-03-27 NOTE — Telephone Encounter (Signed)
Done hardcopy to robin, but also to let pt know  You are given the letter today explaining the transitional pain medication refill policy due to recent change in [place] Law and [place] Regulations  Please be aware that I will no longer be able to offer monthly refills of any Schedule II or higher medication starting Apr 13, 2013

## 2013-03-31 ENCOUNTER — Ambulatory Visit (INDEPENDENT_AMBULATORY_CARE_PROVIDER_SITE_OTHER)
Admission: RE | Admit: 2013-03-31 | Discharge: 2013-03-31 | Disposition: A | Discharge status: Home / Self Care | Payer: Medicare Other | Source: Ambulatory Visit | Attending: Family Medicine | Admitting: Family Medicine

## 2013-03-31 ENCOUNTER — Encounter: Payer: Self-pay | Admitting: Family Medicine

## 2013-03-31 ENCOUNTER — Ambulatory Visit (INDEPENDENT_AMBULATORY_CARE_PROVIDER_SITE_OTHER): Payer: Medicare Other | Admitting: Family Medicine

## 2013-03-31 VITALS — BP 138/76 | HR 95 | Temp 98.3°F | Resp 18 | Wt 243.6 lb

## 2013-03-31 DIAGNOSIS — M25562 Pain in left knee: Secondary | ICD-10-CM

## 2013-03-31 DIAGNOSIS — M25869 Other specified joint disorders, unspecified knee: Secondary | ICD-10-CM | POA: Diagnosis not present

## 2013-03-31 DIAGNOSIS — IMO0002 Reserved for concepts with insufficient information to code with codable children: Secondary | ICD-10-CM | POA: Diagnosis not present

## 2013-03-31 DIAGNOSIS — M76899 Other specified enthesopathies of unspecified lower limb, excluding foot: Secondary | ICD-10-CM

## 2013-03-31 DIAGNOSIS — M7061 Trochanteric bursitis, right hip: Secondary | ICD-10-CM

## 2013-03-31 DIAGNOSIS — Z1329 Encounter for screening for other suspected endocrine disorder: Secondary | ICD-10-CM | POA: Diagnosis not present

## 2013-03-31 DIAGNOSIS — Z136 Encounter for screening for cardiovascular disorders: Secondary | ICD-10-CM | POA: Diagnosis not present

## 2013-03-31 DIAGNOSIS — M25569 Pain in unspecified knee: Secondary | ICD-10-CM

## 2013-03-31 DIAGNOSIS — M1710 Unilateral primary osteoarthritis, unspecified knee: Secondary | ICD-10-CM | POA: Diagnosis not present

## 2013-03-31 DIAGNOSIS — E559 Vitamin D deficiency, unspecified: Secondary | ICD-10-CM | POA: Diagnosis not present

## 2013-03-31 DIAGNOSIS — Z131 Encounter for screening for diabetes mellitus: Secondary | ICD-10-CM | POA: Diagnosis not present

## 2013-03-31 NOTE — Progress Notes (Signed)
CC: Right hip pain and left knee pain  HPI: Onset: Patient is a 58 year old female coming in with right hip pain. Seen previously 3 months ago and was given an injection for greater trochanteric bursitis. Patient did have complete resolution of pain and was doing exercises for approximately 1 month. Patient stopped the exercises and states the pain insidiously has gotten worse again. Patient has a localized to the lateral aspect of the hip with no radiation down the leg and numbness or weakness. Patient states though that is becoming more so or even to lay on that side seems to be very difficult. Please see previous visit for further information of symptoms.  In addition and had patient is also having left knee pain. Patient states that the pain seems to be more in the posterior aspect of the knee but can have some on the medial aspect of the knee. This is worse with walking. Denies any numbness or tingling denies any clicking or any mechanical symptoms. Patient does not remember any true injury. Patient rates the severity of this 7/10.  Past medical, surgical, family and social history reviewed. Medications reviewed all in the electronic medical record.   Review of Systems: No headache, visual changes, nausea, vomiting, diarrhea, constipation, dizziness, abdominal pain, skin rash, fevers, chills, night sweats, weight loss, swollen lymph nodes, body aches, joint swelling, muscle aches, chest pain, shortness of breath, mood changes.   Objective:    Blood pressure 138/76, pulse 95, temperature 98.3 F (36.8 C), temperature source Oral, resp. rate 18, weight 243 lb 9.6 oz (110.496 kg), SpO2 96.00%.   General: No apparent distress alert and oriented x3 mood and affect normal, dressed appropriately. Obese HEENT: Pupils equal, extraocular movements intact Respiratory: Patient's speak in full sentences and does not appear short of breath Cardiovascular: No lower extremity edema, non tender, no  erythema Skin: Warm dry intact with no signs of infection or rash on extremities or on axial skeleton. Abdomen: Soft nontender Neuro: Cranial nerves II through XII are intact, neurovascularly intact in all extremities with 2+ DTRs and 2+ pulses. Lymph: No lymphadenopathy of posterior or anterior cervical chain or axillae bilaterally.  Gait normal with good balance and coordination.  MSK: Non tender with full range of motion and good stability and symmetric strength and tone of shoulders, elbows, wrist,  and ankles bilaterally.  Hip: right ROM IR: 45 Deg, ER: 45 Deg, Flexion: 120 Deg, Extension: 100 Deg, Abduction: 45 Deg, Adduction: 45 Deg Strength IR: 5/5, ER: 5/5, Flexion: 5/5, Extension: 5/5, Abduction: 3/5, Adduction: 5/5 Pelvic alignment unremarkable to inspection and palpation. Standing hip rotation and gait without trendelenburg sign / unsteadiness. Greater trochanter severely tender to palpation No tenderness over piriformis and greater trochanter. No pain with FABER or FADIR. No SI joint tenderness and normal minimal SI movement.  Knee: Normal to inspection with no erythema or effusion or obvious bony abnormalities. Patient's toes medial joint line tenderness. ROM full in flexion and extension and lower leg rotation. Ligaments with solid consistent endpoints including ACL, PCL, LCL, MCL. Equivocal Mcmurray's, Apley's, and Thessalonian tests.  painful patellar compression. Patellar glide with moderate crepitus. Patellar and quadriceps tendons unremarkable. Hamstring and quadriceps strength is normal.     Procedure note After verbal and written consent given pt was prepped with betadine.  1:3 kenalog 40mg /dL to Marcaine 0.5% used in right greater trochanteric bursae.  Ultrasound guidance was used secondary to body habitus of this patient.  Pt minimal bleeding dressed with band aid, Pt given  red flags to look for pt had better pain control immediatly.    Procedure  note  After informed written and verbal consent, patient was seated on exam table. Left knee was prepped with alcohol swab and utilizing anterolateral approach, patient's left knee space was injected with 4:1  marcaine 0.5%: Kenalog 40mg /dL. Patient tolerated the procedure well without immediate complications.    Impression and Recommendations:     This case required medical decision making of moderate complexity.

## 2013-03-31 NOTE — Assessment & Plan Note (Signed)
Patient had repeat injection. Patient was not doing home exercises and never helped patient's muscle imbalances. Patient given home exercise program will also be sent to formal physical therapy. We discussed shoe choices again and discussed the importance of exercise. The pain continues we may want to consider looking at her lumbar spine but I do not think that this is the cause. Patient did have response to the injection again today. Patient was warned that we can only do 3 steroid injections in a calendar year. Patient will follow up in 4 weeks after formal physical therapy.

## 2013-03-31 NOTE — Patient Instructions (Signed)
Good to se eyou again Try exercises 3 times a week. Alternate days for your knee and hip Meloxicam daily for 10 days Ice 20 minutes 2 times a day Go to physical therapy  Come back and see me again in 4 weeks.

## 2013-03-31 NOTE — Assessment & Plan Note (Signed)
Injected today Patient given home exercise program and discussed bracing. Patient will try an over-the-counter brace. Patient formal physical therapy as well. ? Meniscal but will wit on imaging. If pain is worse at followup we may consider an ultrasound. Patient was scheduled for x-rays today.  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 I do think these are contributing. We'll discuss over-the-counter medications that could be beneficial as well as Synvisc supplementation in this individual at followup.

## 2013-03-31 NOTE — Progress Notes (Signed)
Pre-visit discussion using our clinic review tool. No additional management support is needed unless otherwise documented below in the visit note.  

## 2013-04-02 ENCOUNTER — Telehealth: Payer: Self-pay | Admitting: Internal Medicine

## 2013-04-02 MED ORDER — IRBESARTAN-HYDROCHLOROTHIAZIDE 150-12.5 MG PO TABS: 1.0000 | ORAL_TABLET | Freq: Every day | ORAL | Status: DC

## 2013-04-02 NOTE — Telephone Encounter (Signed)
Patient informed and did agree to schedule nurse visit.

## 2013-04-02 NOTE — Telephone Encounter (Signed)
Pt request Dr. Jenny Reichmann to give her something that is cheaper because Losartan is too expensive. [facility] [address]. Please advise

## 2013-04-02 NOTE — Telephone Encounter (Signed)
Ok to try change generic hyzaar to generic avalide 150/12.5 -   Please then make Nurse Visit appt for BP check in 2 wks

## 2013-04-07 ENCOUNTER — Ambulatory Visit: Payer: Medicare Other | Attending: Family Medicine | Admitting: Physical Therapy

## 2013-04-07 DIAGNOSIS — M25559 Pain in unspecified hip: Secondary | ICD-10-CM | POA: Insufficient documentation

## 2013-04-07 DIAGNOSIS — IMO0001 Reserved for inherently not codable concepts without codable children: Secondary | ICD-10-CM | POA: Diagnosis not present

## 2013-04-07 DIAGNOSIS — M545 Low back pain, unspecified: Secondary | ICD-10-CM | POA: Diagnosis not present

## 2013-04-07 DIAGNOSIS — M1710 Unilateral primary osteoarthritis, unspecified knee: Secondary | ICD-10-CM | POA: Insufficient documentation

## 2013-04-07 DIAGNOSIS — R293 Abnormal posture: Secondary | ICD-10-CM | POA: Diagnosis not present

## 2013-04-07 DIAGNOSIS — M25569 Pain in unspecified knee: Secondary | ICD-10-CM | POA: Insufficient documentation

## 2013-04-07 DIAGNOSIS — R262 Difficulty in walking, not elsewhere classified: Secondary | ICD-10-CM | POA: Insufficient documentation

## 2013-04-07 DIAGNOSIS — Z981 Arthrodesis status: Secondary | ICD-10-CM | POA: Insufficient documentation

## 2013-04-07 DIAGNOSIS — R269 Unspecified abnormalities of gait and mobility: Secondary | ICD-10-CM | POA: Diagnosis not present

## 2013-04-16 ENCOUNTER — Ambulatory Visit: Payer: Medicare Other

## 2013-04-17 ENCOUNTER — Ambulatory Visit: Payer: Medicare Other | Attending: Family Medicine | Admitting: Physical Therapy

## 2013-04-17 DIAGNOSIS — M25559 Pain in unspecified hip: Secondary | ICD-10-CM | POA: Diagnosis not present

## 2013-04-17 DIAGNOSIS — R262 Difficulty in walking, not elsewhere classified: Secondary | ICD-10-CM | POA: Diagnosis not present

## 2013-04-17 DIAGNOSIS — M25569 Pain in unspecified knee: Secondary | ICD-10-CM | POA: Insufficient documentation

## 2013-04-17 DIAGNOSIS — R293 Abnormal posture: Secondary | ICD-10-CM | POA: Diagnosis not present

## 2013-04-17 DIAGNOSIS — M545 Low back pain, unspecified: Secondary | ICD-10-CM | POA: Diagnosis not present

## 2013-04-17 DIAGNOSIS — Z981 Arthrodesis status: Secondary | ICD-10-CM | POA: Diagnosis not present

## 2013-04-17 DIAGNOSIS — IMO0001 Reserved for inherently not codable concepts without codable children: Secondary | ICD-10-CM | POA: Insufficient documentation

## 2013-04-17 DIAGNOSIS — R269 Unspecified abnormalities of gait and mobility: Secondary | ICD-10-CM | POA: Insufficient documentation

## 2013-04-17 DIAGNOSIS — M1710 Unilateral primary osteoarthritis, unspecified knee: Secondary | ICD-10-CM | POA: Diagnosis not present

## 2013-04-18 ENCOUNTER — Ambulatory Visit: Payer: Medicare Other

## 2013-04-21 ENCOUNTER — Encounter: Payer: Medicare Other | Admitting: Physical Therapy

## 2013-04-22 ENCOUNTER — Ambulatory Visit: Payer: Medicare Other | Admitting: *Deleted

## 2013-04-22 ENCOUNTER — Telehealth: Payer: Self-pay | Admitting: *Deleted

## 2013-04-22 VITALS — BP 130/80

## 2013-04-22 DIAGNOSIS — I10 Essential (primary) hypertension: Secondary | ICD-10-CM

## 2013-04-22 NOTE — Telephone Encounter (Signed)
Phoned and left voicemail message with MD response and recommendations.

## 2013-04-22 NOTE — Telephone Encounter (Signed)
Ok to cont the avalide, as this would be extremely unlikely to be a cause  Please consider OV with Izora Gala NP for headache if continues

## 2013-04-22 NOTE — Telephone Encounter (Signed)
Please review encounter notes regarding patient's headaches since starting Avalide.  Lungs auscultated and clear (after pt c/o intermittent SOB); peripheral edema absent.  CB# [phone]

## 2013-04-22 NOTE — Patient Instructions (Signed)
Patient states that the reason bp med was changed was due to insurance/costs.  States that since med was changed, her headaches have increased in frequency and in intensity.  States she wakes up with a headache every morning that she would rate a 10 and rates h/a at present a 5.  Will send message to PCP if further action needed/ordered and notify patient.

## 2013-04-23 ENCOUNTER — Encounter: Payer: Medicare Other | Admitting: Physical Therapy

## 2013-04-23 ENCOUNTER — Other Ambulatory Visit: Payer: Self-pay | Admitting: Internal Medicine

## 2013-04-23 NOTE — Telephone Encounter (Signed)
Done hardcopy to robin  

## 2013-04-23 NOTE — Telephone Encounter (Signed)
[facility]axed [facility] to [facility] [address] [place]

## 2013-04-28 ENCOUNTER — Ambulatory Visit: Payer: Medicare Other | Admitting: Family Medicine

## 2013-05-20 ENCOUNTER — Telehealth: Payer: Self-pay | Admitting: *Deleted

## 2013-05-20 NOTE — Telephone Encounter (Signed)
If the pain is that significant, I would recommend seeing Dr Smith/[facility], as he can do cortisione if needed  Shirlean Mylar to let pt know;  Izora Gala to help with appt- next available

## 2013-05-20 NOTE — Telephone Encounter (Signed)
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  Last OV 03/31/13 Please advise or do u prefer OV?  CB# [phone]

## 2013-05-21 NOTE — Telephone Encounter (Signed)
Called left message to call back 

## 2013-05-21 NOTE — Telephone Encounter (Signed)
Called the patient informed of MD instructions, but she stated injections do not last.  Stated she would schedule with Dr. Jenny Reichmann to evaluate options.

## 2013-05-27 ENCOUNTER — Ambulatory Visit: Payer: Medicare Other | Admitting: Family Medicine

## 2013-06-06 DIAGNOSIS — Z1231 Encounter for screening mammogram for malignant neoplasm of breast: Secondary | ICD-10-CM | POA: Diagnosis not present

## 2013-06-10 ENCOUNTER — Ambulatory Visit (INDEPENDENT_AMBULATORY_CARE_PROVIDER_SITE_OTHER): Payer: Medicare Other | Admitting: Family Medicine

## 2013-06-10 ENCOUNTER — Encounter: Payer: Self-pay | Admitting: Family Medicine

## 2013-06-10 VITALS — BP 130/84 | HR 81

## 2013-06-10 DIAGNOSIS — M629 Disorder of muscle, unspecified: Secondary | ICD-10-CM

## 2013-06-10 DIAGNOSIS — M25569 Pain in unspecified knee: Secondary | ICD-10-CM | POA: Diagnosis not present

## 2013-06-10 DIAGNOSIS — M25562 Pain in left knee: Secondary | ICD-10-CM

## 2013-06-10 DIAGNOSIS — M7061 Trochanteric bursitis, right hip: Secondary | ICD-10-CM

## 2013-06-10 DIAGNOSIS — M7630 Iliotibial band syndrome, unspecified leg: Secondary | ICD-10-CM

## 2013-06-10 DIAGNOSIS — M76899 Other specified enthesopathies of unspecified lower limb, excluding foot: Secondary | ICD-10-CM

## 2013-06-10 MED ORDER — PREDNISONE 50 MG PO TABS: 50.0000 mg | ORAL_TABLET | Freq: Every day | ORAL | Status: DC

## 2013-06-10 NOTE — Patient Instructions (Signed)
Good to see you Please go to physical therapy at least once to learn exercises correctly. Exercises 4 times a week.  Vitamin D 2000 IU daily Turmeric 500mg  twice daily.  Ice 20 minutes 2 times a day Continue to wear good shoes.  Try the topical  2 times a day Prednisone daily for 5 days.  Come back again in 3-4 weeks

## 2013-06-10 NOTE — Assessment & Plan Note (Signed)
Spent greater than 25 minutes with patient face-to-face and had greater than 50% of counseling including as described above in assessment and plan.  Patient did not want to have another injection so we will do a trial of prednisone to see if this will help her knee as well as the lateral hip. Discussed icing protocol and doing the exercises on a regular basis. Please see orders for further detail. Please see patient instructions for further detail. Patient coming again in 3-4 weeks for further evaluation. If this continues we may want to consider further imaging such as an MRI but I do not think that this would change management patient not having likely any intra-articular hip problems or problems at this time.

## 2013-06-10 NOTE — Progress Notes (Signed)
  CC: Right hip pain  Follow up.   HPI: Onset: Patient is a 58 year old female coming in with right hip pain. S patient was seen approximately 3 months ago was given an injection into the right hip. Patient states at this time it only lasted approximately 6 weeks and then started to gradually increase again. Patient states it is worse with ambulation and is a dull aching pain at rest. Patient is difficult to sleep on that side secondary to pain and states that it radiates on the lateral aspect of the leg but denies any back pain. Patient states the pain never crosses her knee. Patient is finding it difficult to do some activities of daily living secondary to pain. Patient has been taking pain medications for this. She gets this from another provider. Patient has not been doing the exercises as regular a she should.  Patient is also coming in with some mild left knee pain mostly on the lateral aspect. Once again this seems to be worse with ambulation. 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 Patient has not been doing exercises. Patient elected over brace would be helpful.  Past medical, surgical, family and social history reviewed. Medications reviewed all in the electronic medical record.   Review of Systems: No headache, visual changes, nausea, vomiting, diarrhea, constipation, dizziness, abdominal pain, skin rash, fevers, chills, night sweats, weight loss, swollen lymph nodes, body aches, joint swelling, muscle aches, chest pain, shortness of breath, mood changes.   Objective:    Blood pressure 130/84, pulse 81, SpO2 98.00%.   General: No apparent distress alert and oriented x3 mood and affect normal, dressed appropriately. Obese HEENT: Pupils equal, extraocular movements intact Respiratory: Patient's speak in full sentences and does not appear short of breath Cardiovascular: No lower extremity edema, non tender, no erythema Skin: Warm dry intact with no signs of  infection or rash on extremities or on axial skeleton. Abdomen: Soft nontender Neuro: Cranial nerves II through XII are intact, neurovascularly intact in all extremities with 2+ DTRs and 2+ pulses. Lymph: No lymphadenopathy of posterior or anterior cervical chain or axillae bilaterally.  Gait normal with good balance and coordination.  MSK: Non tender with full range of motion and good stability and symmetric strength and tone of shoulders, elbows, wrist,  and ankles bilaterally.  Hip: right ROM IR: 45 Deg, ER: 45 Deg, Flexion: 120 Deg, Extension: 100 Deg, Abduction: 45 Deg, Adduction: 45 Deg Strength IR: 5/5, ER: 5/5, Flexion: 5/5, Extension: 5/5, Abduction: 3/5, Adduction: 5/5 Pelvic alignment unremarkable to inspection and palpation. Standing hip rotation and gait without trendelenburg sign / unsteadiness. Greater trochanter severely tender to palpation No tenderness over piriformis and greater trochanter. No pain with FABER or FADIR. No SI joint tenderness and normal minimal SI movement.  Knee: Left knee Normal to inspection with no erythema or effusion or obvious bony abnormalities. Palpation normal with no warmth, joint line tenderness, patellar tenderness, or condyle tenderness. ROM full in flexion and extension and lower leg rotation. Ligaments with solid consistent endpoints including ACL, PCL, LCL, MCL. Negative Mcmurray's, Apley's, and Thessalonian tests. Non painful patellar compression. Patellar glide without crepitus. Patellar and quadriceps tendons unremarkable. Hamstring and quadriceps strength is normal.    Impression and Recommendations:     This case required medical decision making of moderate complexity.

## 2013-06-10 NOTE — Assessment & Plan Note (Signed)
She was fitted with a knee brace today. Discuss when this with exercises. Patient will go to formal physical therapy for range of motion exercises to be beneficial. Patient will come back and see me in 3-4 weeks. If he continues with female consider an intra-articular injection.

## 2013-06-11 ENCOUNTER — Ambulatory Visit: Payer: Medicare Other | Admitting: Family Medicine

## 2013-06-17 ENCOUNTER — Ambulatory Visit: Payer: Medicare Other | Admitting: Physical Therapy

## 2013-06-17 ENCOUNTER — Other Ambulatory Visit: Payer: Self-pay | Admitting: Internal Medicine

## 2013-06-17 NOTE — Telephone Encounter (Signed)
Done hardcopy to robin  

## 2013-06-17 NOTE — Telephone Encounter (Signed)
Faxed [facility]copy to [facility] [address] [place] 

## 2013-07-08 ENCOUNTER — Ambulatory Visit: Payer: Medicare Other | Admitting: Family Medicine

## 2013-07-08 DIAGNOSIS — Z0289 Encounter for other administrative examinations: Secondary | ICD-10-CM

## 2013-07-30 ENCOUNTER — Other Ambulatory Visit: Payer: Self-pay | Admitting: Internal Medicine

## 2013-07-30 NOTE — Telephone Encounter (Signed)
Done hardcopy to robin  

## 2013-07-31 NOTE — Telephone Encounter (Signed)
Faxed [facility]copy to [facility] [address] [place] 

## 2013-09-01 ENCOUNTER — Telehealth: Payer: Self-pay | Admitting: Family Medicine

## 2013-09-01 ENCOUNTER — Ambulatory Visit: Payer: Medicare Other | Admitting: Family Medicine

## 2013-09-01 DIAGNOSIS — Z0289 Encounter for other administrative examinations: Secondary | ICD-10-CM

## 2013-09-01 NOTE — Telephone Encounter (Signed)
Noted  

## 2013-09-01 NOTE — Telephone Encounter (Signed)
Patient no showed for follow up on knee pain.  Patient does not have another standing appt with Dr.Smith.  Please advise.

## 2013-09-03 ENCOUNTER — Ambulatory Visit: Payer: Medicare Other | Admitting: Internal Medicine

## 2013-09-03 DIAGNOSIS — Z0289 Encounter for other administrative examinations: Secondary | ICD-10-CM

## 2013-09-05 ENCOUNTER — Ambulatory Visit (INDEPENDENT_AMBULATORY_CARE_PROVIDER_SITE_OTHER): Payer: Medicare Other | Admitting: Internal Medicine

## 2013-09-05 ENCOUNTER — Other Ambulatory Visit (INDEPENDENT_AMBULATORY_CARE_PROVIDER_SITE_OTHER): Payer: Medicare Other

## 2013-09-05 ENCOUNTER — Encounter: Payer: Self-pay | Admitting: Internal Medicine

## 2013-09-05 ENCOUNTER — Other Ambulatory Visit: Payer: Self-pay | Admitting: Internal Medicine

## 2013-09-05 VITALS — BP 110/72 | HR 97 | Temp 98.6°F | Ht 69.0 in | Wt 235.5 lb

## 2013-09-05 DIAGNOSIS — I10 Essential (primary) hypertension: Secondary | ICD-10-CM

## 2013-09-05 DIAGNOSIS — F4321 Adjustment disorder with depressed mood: Secondary | ICD-10-CM

## 2013-09-05 DIAGNOSIS — M25569 Pain in unspecified knee: Secondary | ICD-10-CM | POA: Diagnosis not present

## 2013-09-05 DIAGNOSIS — E785 Hyperlipidemia, unspecified: Secondary | ICD-10-CM

## 2013-09-05 DIAGNOSIS — M25562 Pain in left knee: Secondary | ICD-10-CM

## 2013-09-05 LAB — URINALYSIS, ROUTINE W REFLEX MICROSCOPIC
BILIRUBIN URINE: NEGATIVE
HGB URINE DIPSTICK: NEGATIVE
KETONES UR: NEGATIVE
Leukocytes, UA: NEGATIVE
NITRITE: NEGATIVE
RBC / HPF: NONE SEEN (ref 0–?)
Specific Gravity, Urine: 1.01 (ref 1.000–1.030)
TOTAL PROTEIN, URINE-UPE24: NEGATIVE
Urine Glucose: NEGATIVE
Urobilinogen, UA: 0.2 (ref 0.0–1.0)
WBC, UA: NONE SEEN (ref 0–?)
pH: 6 (ref 5.0–8.0)

## 2013-09-05 LAB — CBC WITH DIFFERENTIAL/PLATELET
BASOS PCT: 1 % (ref 0.0–3.0)
Basophils Absolute: 0.1 10*3/uL (ref 0.0–0.1)
EOS ABS: 0.2 10*3/uL (ref 0.0–0.7)
Eosinophils Relative: 3.6 % (ref 0.0–5.0)
HCT: 37.3 % (ref 36.0–46.0)
Hemoglobin: 12.6 g/dL (ref 12.0–15.0)
Lymphocytes Relative: 17.3 % (ref 12.0–46.0)
Lymphs Abs: 1.2 10*3/uL (ref 0.7–4.0)
MCHC: 33.7 g/dL (ref 30.0–36.0)
MCV: 81.8 fl (ref 78.0–100.0)
MONO ABS: 0.5 10*3/uL (ref 0.1–1.0)
Monocytes Relative: 7.3 % (ref 3.0–12.0)
Neutro Abs: 4.9 10*3/uL (ref 1.4–7.7)
Neutrophils Relative %: 70.8 % (ref 43.0–77.0)
Platelets: 232 10*3/uL (ref 150.0–400.0)
RBC: 4.56 Mil/uL (ref 3.87–5.11)
RDW: 12.4 % (ref 11.5–15.5)
WBC: 7 10*3/uL (ref 4.0–10.5)

## 2013-09-05 LAB — LIPID PANEL
CHOL/HDL RATIO: 3
Cholesterol: 242 mg/dL — ABNORMAL HIGH (ref 0–200)
HDL: 70.8 mg/dL (ref >39.00)
LDL Cholesterol: 146 mg/dL — ABNORMAL HIGH (ref 0–99)
NONHDL: 171.2
Triglycerides: 127 mg/dL (ref 0.0–149.0)
VLDL: 25.4 mg/dL (ref 0.0–40.0)

## 2013-09-05 LAB — BASIC METABOLIC PANEL
BUN: 19 mg/dL (ref 6–23)
CHLORIDE: 101 meq/L (ref 96–112)
CO2: 28 mEq/L (ref 19–32)
CREATININE: 0.7 mg/dL (ref 0.4–1.2)
Calcium: 9.6 mg/dL (ref 8.4–10.5)
GFR: 108.34 mL/min (ref >60.00)
Glucose, Bld: 105 mg/dL — ABNORMAL HIGH (ref 70–99)
POTASSIUM: 3.4 meq/L — AB (ref 3.5–5.1)
Sodium: 138 mEq/L (ref 135–145)

## 2013-09-05 LAB — HEPATIC FUNCTION PANEL
ALT: 16 U/L (ref 0–35)
AST: 19 U/L (ref 0–37)
Albumin: 4.3 g/dL (ref 3.5–5.2)
Alkaline Phosphatase: 56 U/L (ref 39–117)
BILIRUBIN DIRECT: 0.1 mg/dL (ref 0.0–0.3)
BILIRUBIN TOTAL: 0.6 mg/dL (ref 0.2–1.2)
Total Protein: 8.2 g/dL (ref 6.0–8.3)

## 2013-09-05 LAB — TSH: TSH: 0.99 u[IU]/mL (ref 0.35–4.50)

## 2013-09-05 MED ORDER — DULOXETINE HCL 30 MG PO CPEP: 30.0000 mg | ORAL_CAPSULE | Freq: Every day | ORAL | Status: DC

## 2013-09-05 MED ORDER — NAPROXEN 500 MG PO TABS: 500.0000 mg | ORAL_TABLET | Freq: Two times a day (BID) | ORAL | Status: DC

## 2013-09-05 MED ORDER — ATORVASTATIN CALCIUM 20 MG PO TABS: 20.0000 mg | ORAL_TABLET | Freq: Every day | ORAL | Status: DC

## 2013-09-05 MED ORDER — DULOXETINE HCL 60 MG PO CPEP: 60.0000 mg | ORAL_CAPSULE | Freq: Every day | ORAL | Status: DC

## 2013-09-05 NOTE — Progress Notes (Signed)
Pre visit review using our clinic review tool, if applicable. No additional management support is needed unless otherwise documented below in the visit note. 

## 2013-09-05 NOTE — Progress Notes (Signed)
Subjective:    Patient ID: Pamela [name], female    DOB: [birth year], 58 y.o.   MRN: [id number]  HPI Here for yearly f/u;  Overall doing ok;  Pt denies CP, worsening SOB, DOE, wheezing, orthopnea, PND, worsening LE edema, palpitations, dizziness or syncope.  Pt denies neurological change such as new headache, facial or extremity weakness.  Pt denies polydipsia, polyuria, or low sugar symptoms. Pt states overall good compliance with treatment and medications, good tolerability, and has been trying to follow lower cholesterol diet.  Pt denies worsening depressive symptoms, suicidal ideation or panic. No fever, night sweats, wt loss, loss of appetite, or other constitutional symptoms.  Pt states good ability with ADL's, has low fall risk, home safety reviewed and adequate, no other significant changes in hearing or vision, and only occasionally active with exercise. Denies worsening depressive symptoms, suicidal ideation, or panic  Also Here to f/u with c/o pain for 1 yr, mostly to left knee with "popping out of joint" as it seems to want to move inward with walking, also right hip pain with needle like stinging burning pain, has seen Dr Smith/[facility] with cortisone to knee and hip.  Also other diffuse myalgias.  Has been wearing the brace for the left knee. No knee giveaway or falls.  Pt denies fever, wt loss, night sweats, loss of appetite, or other constitutional symptoms   Trying to avoid surgury, has not seen ortho. Did her PT, has been doing some excercises at home.  No LBP though has "all over achy pain to the legs." Past Medical History  Diagnosis Date  . Hypertension   . HYPERLIPIDEMIA 12/18/2006    Qualifier: History of  By: Danny Lawless CMA, [place]    . DEPRESSION, SITUATIONAL 03/15/2007    Qualifier: Diagnosis of  By: Wynona Luna   . HYPERTENSION 12/18/2006    Qualifier: Diagnosis of  By: Danny Lawless CMA, [place]    . ALLERGIC RHINITIS 12/18/2006    Qualifier: Diagnosis of  By: [place],  [place]    . INSOMNIA-SLEEP DISORDER-UNSPEC 08/25/2009    Qualifier: Diagnosis of  By: Jenny Reichmann MD, [place]   . RENAL CALCULUS, HX OF 12/18/2006    Qualifier: Diagnosis of  By: [place], [place]     Past Surgical History  Procedure Laterality Date  . Cervical disc surgery      x 3    reports that she has quit smoking. She has never used smokeless tobacco. She reports that she drinks alcohol. She reports that she does not use illicit drugs. family history includes Heart disease in her father; Stroke in her mother. No Known Allergies Current Outpatient Prescriptions on File Prior to Visit  Medication Sig Dispense Refill  . ALPRAZolam (XANAX) 0.25 MG tablet TAKE 1 TABLET TWICE A DAY AS NEEDED  60 tablet  1  . atorvastatin (LIPITOR) 10 MG tablet Take 10 mg by mouth daily.      . butalbital-acetaminophen-caffeine (FIORICET, ESGIC) 50-325-40 MG per tablet take 1 tablet by mouth twice a day  60 tablet  4  . cetirizine (ZYRTEC) 10 MG tablet Take 1 tablet (10 mg total) by mouth daily.  30 tablet  11  . cyclobenzaprine (FLEXERIL) 5 MG tablet take 1 tablet by mouth three times a day if needed  60 tablet  5  . DEXILANT 60 MG capsule take 1 capsule by mouth once daily  30 capsule  PRN  . fluocinonide (LIDEX) 0.05 % external solution Apply topically 2 (  two) times daily.  60 mL  0  . HYDROcodone-acetaminophen (NORCO/VICODIN) 5-325 MG per tablet take 1 tablet by mouth every 6 hours if needed for pain - to fill Mar 27, 2013  60 tablet  0  . irbesartan-hydrochlorothiazide (AVALIDE) 150-12.5 MG per tablet Take 1 tablet by mouth daily.  90 tablet  1  . meloxicam (MOBIC) 15 MG tablet Take 1 tablet (15 mg total) by mouth daily.  30 tablet  0  . metoCLOPramide (REGLAN) 10 MG tablet take 1 tablet by mouth four times a day if needed  120 tablet  3  . pantoprazole (PROTONIX) 40 MG tablet Take 1 tablet (40 mg total) by mouth daily.  90 tablet  3  . sertraline (ZOLOFT) 100 MG tablet take 2 tablet by mouth once daily  180  tablet  3  . traMADol (ULTRAM) 50 MG tablet TAKE 1 TABLET EVERY 6 HOURS AS NEEDED FOR PAIN  120 tablet  1  . VOLTAREN 1 % GEL apply 2 TO 4 TIMES A DAY if needed  100 g  PRN  . zolpidem (AMBIEN) 10 MG tablet TAKE 1 TABLET AT BEDTIME AS NEEDED FOR SLEEP  30 tablet  2   No current facility-administered medications on file prior to visit.   Review of Systems Constitutional: Negative for increased diaphoresis, other activity, appetite or other siginficant weight change  HENT: Negative for worsening hearing loss, ear pain, facial swelling, mouth sores and neck stiffness.   Eyes: Negative for other worsening pain, redness or visual disturbance.  Respiratory: Negative for shortness of breath and wheezing.   Cardiovascular: Negative for chest pain and palpitations.  Gastrointestinal: Negative for diarrhea, blood in stool, abdominal distention or other pain Genitourinary: Negative for hematuria, flank pain or change in urine volume.  Musculoskeletal: Negative for myalgias or other joint complaints.  Skin: Negative for color change and wound.  Neurological: Negative for syncope and numbness. other than noted Hematological: Negative for adenopathy. or other swelling Psychiatric/Behavioral: Negative for hallucinations, self-injury, decreased concentration or other worsening agitation.      Objective:   Physical Exam BP 110/72  Pulse 97  Temp(Src) 98.6 F (37 C) (Oral)  Ht 5\' 9"  (1.753 m)  Wt 235 lb 8 oz (106.822 kg)  BMI 34.76 kg/m2  SpO2 96% VS noted,  Constitutional: Pt is oriented to person, place, and time. Appears well-developed and well-nourished.  Head: Normocephalic and atraumatic.  Right Ear: External ear normal.  Left Ear: External ear normal.  Nose: Nose normal.  Mouth/Throat: Oropharynx is clear and moist.  Eyes: Conjunctivae and EOM are normal. Pupils are equal, round, and reactive to light.  Neck: Normal range of motion. Neck supple. No JVD present. No tracheal deviation  present.  Cardiovascular: Normal rate, regular rhythm, normal heart sounds and intact distal pulses.   Pulmonary/Chest: Effort normal and breath sounds without rales or wheezing  Abdominal: Soft. Bowel sounds are normal. NT. No HSM  Musculoskeletal: Normal range of motion. Exhibits no edema.  Lymphadenopathy:  Has no cervical adenopathy.  Neurological: Pt is alert and oriented to person, place, and time. Pt has normal reflexes. No cranial nerve deficit. Motor grossly intact Skin: Skin is warm and dry. No rash noted.  Psychiatric:  Has normal mood and affect. Behavior is normal.  Left knee with bony deg changes, NT, FROM, no effusion, neg lachman    Assessment & Plan:

## 2013-09-05 NOTE — Patient Instructions (Signed)
OK to stop the mobic (meloxicam) and zoloft (sertraline)  Please take all new medication as prescribed - the naproxen as needed (for anti-inflammatory), and Cymbalta (start with 30 mg per day for 1 week, the 60 mg per day after that)  Please continue all other medications as before, and refills have been done if requested.  Please have the pharmacy call with any other refills you may need.  Please continue your efforts at being more active, low cholesterol diet, and weight control.  You are otherwise up to date with prevention measures today.  Please keep your appointments with your specialists as you may have planned  Please go to the LAB in the Basement (turn left off the elevator) for the tests to be done today  You will be contacted by phone if any changes need to be made immediately.  Otherwise, you will receive a letter about your results with an explanation, but please check with MyChart first.  Please remember to sign up for MyChart if you have not done so, as this will be important to you in the future with finding out test results, communicating by private email, and scheduling acute appointments online when needed.  Please return in 6 months, or sooner if needed

## 2013-09-07 NOTE — Assessment & Plan Note (Signed)
stable overall by history and exam, recent data reviewed with pt, and pt to continue medical treatment as before,  to f/u any worsening symptoms or concerns BP Readings from Last 3 Encounters:  09/05/13 110/72  06/10/13 130/84  04/22/13 130/80

## 2013-09-07 NOTE — Assessment & Plan Note (Signed)
stable overall by history and exam, and pt to continue medical treatment as before,  to f/u any worsening symptoms or concerns 

## 2013-09-07 NOTE — Assessment & Plan Note (Signed)
stable overall by history and exam,  and pt to continue medical treatment as before,  to f/u any worsening symptoms or concerns, goal ldl < 100, consider statin

## 2013-09-07 NOTE — Assessment & Plan Note (Signed)
For nsaid and cymbalta for pain, declines ortho f/u for now,  to f/u any worsening symptoms or concerns

## 2013-09-18 ENCOUNTER — Other Ambulatory Visit: Payer: Self-pay | Admitting: Internal Medicine

## 2013-09-18 NOTE — Telephone Encounter (Signed)
Faxed hardcopy for Fioricet to [facility]

## 2013-09-18 NOTE — Telephone Encounter (Signed)
Faxed hardcopy to [facility]. (Fioricet)

## 2013-10-10 ENCOUNTER — Encounter: Payer: Self-pay | Admitting: Family Medicine

## 2013-10-10 ENCOUNTER — Ambulatory Visit (INDEPENDENT_AMBULATORY_CARE_PROVIDER_SITE_OTHER): Payer: Medicare Other | Admitting: Family Medicine

## 2013-10-10 VITALS — BP 122/80 | HR 85 | Ht 69.0 in | Wt 241.0 lb

## 2013-10-10 DIAGNOSIS — M25559 Pain in unspecified hip: Secondary | ICD-10-CM | POA: Diagnosis not present

## 2013-10-10 DIAGNOSIS — M25569 Pain in unspecified knee: Secondary | ICD-10-CM | POA: Diagnosis not present

## 2013-10-10 DIAGNOSIS — M25552 Pain in left hip: Secondary | ICD-10-CM

## 2013-10-10 DIAGNOSIS — M1712 Unilateral primary osteoarthritis, left knee: Secondary | ICD-10-CM

## 2013-10-10 DIAGNOSIS — M25562 Pain in left knee: Secondary | ICD-10-CM

## 2013-10-10 DIAGNOSIS — M1710 Unilateral primary osteoarthritis, unspecified knee: Secondary | ICD-10-CM | POA: Diagnosis not present

## 2013-10-10 MED ORDER — DICLOFENAC SODIUM 2 % TD SOLN: TRANSDERMAL | Status: DC

## 2013-10-10 NOTE — Progress Notes (Signed)
  OF:BPZW knee pain  HPI: Onset: Patient is a 58 year old female coming in for followup of left knee pain. Patient continues to have unfortunate pain. Patient was seen back in January for similar symptoms and was given a corticosteroid injection. Patient did follow up 2 months later and was doing much better. Patient has not been doing exercises as regularly. Patient states it is a dull aching pain that is constant severe pain with ambulation. States that is stopping her from certain activities. He rates the pain is 9/10.  Patient has had x-rays previously showing mild to moderate osteophytic changes mostly in the medial compartment.  Past medical, surgical, family and social history reviewed. Medications reviewed all in the electronic medical record.   Review of Systems: No headache, visual changes, nausea, vomiting, diarrhea, constipation, dizziness, abdominal pain, skin rash, fevers, chills, night sweats, weight loss, swollen lymph nodes, body aches, joint swelling, muscle aches, chest pain, shortness of breath, mood changes.   Objective:    Blood pressure 122/80, pulse 85, height 5\' 9"  (1.753 m), weight 241 lb (109.317 kg), SpO2 94.00%.   General: No apparent distress alert and oriented x3 mood and affect normal, dressed appropriately. Obese HEENT: Pupils equal, extraocular movements intact Respiratory: Patient's speak in full sentences and does not appear short of breath Cardiovascular: No lower extremity edema, non tender, no erythema Skin: Warm dry intact with no signs of infection or rash on extremities or on axial skeleton. Abdomen: Soft nontender Neuro: Cranial nerves II through XII are intact, neurovascularly intact in all extremities with 2+ DTRs and 2+ pulses. Lymph: No lymphadenopathy of posterior or anterior cervical chain or axillae bilaterally.  Gait normal with good balance and coordination.  MSK: Non tender with full range of motion and good stability and symmetric strength  and tone of shoulders, elbows, wrist,  and ankles bilaterally.    Knee: Left Normal to inspection with no erythema or effusion or obvious bony abnormalities. Patient's is tender medial joint line tenderness.  ROM full in flexion and extension and lower leg rotation. Ligaments with solid consistent endpoints including ACL, PCL, LCL, MCL. Equivocal Mcmurray's, Apley's, and Thessalonian tests.  painful patellar compression. Patellar glide with moderate crepitus. Patellar and quadriceps tendons unremarkable. Hamstring and quadriceps strength is normal.  Contralateral knee also has some medial joint line tenderness.  Procedure note  After informed written and verbal consent, patient was seated on exam table. Left knee was prepped with alcohol swab and utilizing anterolateral approach, patient's left knee space was injected with 4:1  marcaine 0.5%: Kenalog 40mg /dL. Patient tolerated the procedure well without immediate complications.    Impression and Recommendations:     This case required medical decision making of moderate complexity.

## 2013-10-10 NOTE — Patient Instructions (Signed)
Good to see you again.  Ice is still your friend.  Wear the compression sleeve and then whatever brace you want.  Try the pennsaid twice daily.  They will be sending it to you.  Physical therapy will be calling you Start the exercises again for the knee and hip both sides 3 times a week.  Come back in 3-4 weeks or at least call.

## 2013-10-10 NOTE — Assessment & Plan Note (Signed)
Patient does have mild to moderate osteoarthritic changes of his knee. Patient was given another injection today with fairly good resolution of pain. Patient was also sent to formal physical therapy which I think will be beneficial. We discussed continuing home exercises and icing protocol a more regular basis. Patient will also try topical anti-inflammatory. Patient is not wearing the brace a regular basis and encourage her to do so. Patient try these changes and come back again in 3-4 weeks. Patient continues to have trouble we may want to consider an MRI versus viscous supplementation.  Spent greater than 25 minutes with patient face-to-face and had greater than 50% of counseling including as described above in assessment and plan.

## 2013-10-27 ENCOUNTER — Other Ambulatory Visit: Payer: Self-pay | Admitting: Internal Medicine

## 2013-10-28 NOTE — Telephone Encounter (Signed)
Left msg on triage rx for Pamela [name] has expired [facility].../lmb

## 2013-10-28 NOTE — Telephone Encounter (Signed)
Done hardcopy to robin  

## 2013-10-29 NOTE — Telephone Encounter (Signed)
Faxed hardcopy for Zolpidem to [facility]

## 2013-10-31 ENCOUNTER — Ambulatory Visit (INDEPENDENT_AMBULATORY_CARE_PROVIDER_SITE_OTHER): Payer: Medicare Other | Admitting: Family Medicine

## 2013-10-31 ENCOUNTER — Encounter: Payer: Self-pay | Admitting: Family Medicine

## 2013-10-31 ENCOUNTER — Ambulatory Visit: Payer: Medicare Other | Admitting: Family Medicine

## 2013-10-31 VITALS — BP 122/80 | HR 89 | Ht 69.0 in | Wt 235.0 lb

## 2013-10-31 DIAGNOSIS — M1712 Unilateral primary osteoarthritis, left knee: Secondary | ICD-10-CM

## 2013-10-31 DIAGNOSIS — M1710 Unilateral primary osteoarthritis, unspecified knee: Secondary | ICD-10-CM | POA: Diagnosis not present

## 2013-10-31 NOTE — Progress Notes (Signed)
  LG:XQJJ knee pain followup  HPI: Onset: Patient is a 58 year old female coming in for followup of left knee pain. 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 Patient was given a corticosteroid injection at last visit 3 weeks ago. Patient states that she is 90% better. He is doing the topical gel. Is doing home exercises fairly regularly. Wears braces on an as-needed basis. Overall patient is fairly happy with the results.  Patient has had x-rays previously showing mild to moderate osteophytic changes mostly in the medial compartment.  Past medical, surgical, family and social history reviewed. Medications reviewed all in the electronic medical record.   Review of Systems: No headache, visual changes, nausea, vomiting, diarrhea, constipation, dizziness, abdominal pain, skin rash, fevers, chills, night sweats, weight loss, swollen lymph nodes, body aches, joint swelling, muscle aches, chest pain, shortness of breath, mood changes.   Objective:    Blood pressure 122/80, pulse 89, height 5\' 9"  (1.753 m), weight 235 lb (106.595 kg), SpO2 97.00%.   General: No apparent distress alert and oriented x3 mood and affect normal, dressed appropriately. Obese HEENT: Pupils equal, extraocular movements intact Respiratory: Patient's speak in full sentences and does not appear short of breath Cardiovascular: No lower extremity edema, non tender, no erythema Skin: Warm dry intact with no signs of infection or rash on extremities or on axial skeleton. Abdomen: Soft nontender Neuro: Cranial nerves II through XII are intact, neurovascularly intact in all extremities with 2+ DTRs and 2+ pulses. Lymph: No lymphadenopathy of posterior or anterior cervical chain or axillae bilaterally.  Gait normal with good balance and coordination.  MSK: Non tender with full range of motion and good stability and symmetric strength and tone of shoulders, elbows, wrist,  and ankles bilaterally.     Knee: Left Normal to inspection with no erythema or effusion or obvious bony abnormalities. Patient's is tender medial joint line tenderness but less than previous exam.  ROM full in flexion and extension and lower leg rotation. Ligaments with solid consistent endpoints including ACL, PCL, LCL, MCL. Mildly positive Mcmurray's, Apley's, and Thessalonian tests.  painful patellar compression. Patellar glide with moderate crepitus. Patellar and quadriceps tendons unremarkable. Hamstring and quadriceps strength is normal.  Contralateral knee also has some medial joint line tenderness.    Impression and Recommendations:     This case required medical decision making of moderate complexity.

## 2013-10-31 NOTE — Patient Instructions (Signed)
Good to see you Still ice at end of day Exercises 3 times a week for at least another 6 weeks Consider biking or swimming to help Take tylenol 650 mg three times a day is the best evidence based medicine we have for arthritis.  Glucosamine sulfate 750mg  twice a day is a supplement that has been shown to help moderate to severe arthritis. Vitamin D 2000 IU daily Fish oil 2 grams daily.  Tumeric 500mg  twice daily.  Come back in 2 months.

## 2013-10-31 NOTE — Assessment & Plan Note (Signed)
The patient is doing better at this time. Encourage her to continue the exercises on a regular basis as well for topical anti-inflammatories. Discuss continuing the icing. Patient has any worsening pain she'll come back earlier otherwise she will follow up again in 4-6 weeks for further evaluation and treatment. Patient could be a candidate still for another round of formal physical therapy or for viscous supplementation if he continues to give her difficulty.  Spent greater than 25 minutes with patient face-to-face and had greater than 50% of counseling including as described above in assessment and plan.

## 2013-11-03 ENCOUNTER — Other Ambulatory Visit: Payer: Self-pay | Admitting: Internal Medicine

## 2013-11-03 NOTE — Telephone Encounter (Signed)
Pt called stating that she is having spasms in her neck, and wanted to know if she could get a refill on cyclobenzaprine 60 mg tid prn.  Please advise

## 2013-11-03 NOTE — Telephone Encounter (Signed)
Done erx 

## 2013-11-11 ENCOUNTER — Other Ambulatory Visit: Payer: Self-pay | Admitting: Internal Medicine

## 2013-12-02 ENCOUNTER — Other Ambulatory Visit: Payer: Self-pay | Admitting: Internal Medicine

## 2013-12-12 DIAGNOSIS — Z23 Encounter for immunization: Secondary | ICD-10-CM | POA: Diagnosis not present

## 2013-12-15 ENCOUNTER — Telehealth: Payer: Self-pay

## 2013-12-15 NOTE — Telephone Encounter (Signed)
Flu documentation

## 2013-12-29 ENCOUNTER — Ambulatory Visit: Payer: Medicare Other | Admitting: Family Medicine

## 2013-12-29 DIAGNOSIS — Z0289 Encounter for other administrative examinations: Secondary | ICD-10-CM

## 2014-01-02 ENCOUNTER — Other Ambulatory Visit: Payer: Self-pay | Admitting: Internal Medicine

## 2014-01-19 ENCOUNTER — Other Ambulatory Visit: Payer: Self-pay | Admitting: Internal Medicine

## 2014-01-20 NOTE — Telephone Encounter (Signed)
Done hardcopy to robin  

## 2014-01-21 NOTE — Telephone Encounter (Signed)
Faxed hardcopy for Tramadol to [facility] [address] [place] 

## 2014-02-10 ENCOUNTER — Other Ambulatory Visit: Payer: Self-pay | Admitting: Internal Medicine

## 2014-02-16 ENCOUNTER — Encounter: Payer: Self-pay | Admitting: Family Medicine

## 2014-02-16 ENCOUNTER — Other Ambulatory Visit (INDEPENDENT_AMBULATORY_CARE_PROVIDER_SITE_OTHER): Payer: Medicare Other

## 2014-02-16 ENCOUNTER — Ambulatory Visit (INDEPENDENT_AMBULATORY_CARE_PROVIDER_SITE_OTHER): Payer: Medicare Other | Admitting: Family Medicine

## 2014-02-16 VITALS — BP 114/78 | HR 82 | Ht 69.0 in | Wt 236.0 lb

## 2014-02-16 DIAGNOSIS — M76891 Other specified enthesopathies of right lower limb, excluding foot: Secondary | ICD-10-CM | POA: Diagnosis not present

## 2014-02-16 DIAGNOSIS — M25551 Pain in right hip: Secondary | ICD-10-CM

## 2014-02-16 DIAGNOSIS — M1712 Unilateral primary osteoarthritis, left knee: Secondary | ICD-10-CM | POA: Diagnosis not present

## 2014-02-16 NOTE — Patient Instructions (Signed)
Good to see you.  Ice 20 minutes 2 times daily. Usually after activity and before bed. Exercises 3 times a week.  Alternate the hip and the knee.  Wear good shoes.  See me again in 3-4 weeks.

## 2014-02-16 NOTE — Assessment & Plan Note (Signed)
Patient was given an injection today and tolerated the procedure very well. Patient still in an icing protocol and we discussed the importance of hip abductor strengthening exercises. Patient has worsening pain we need to consider further imaging.

## 2014-02-16 NOTE — Assessment & Plan Note (Signed)
Patient was given a critical steroid injection. Work for proximally 4 months. Discussed icing, home exercises, weight loss, proper shoes, and patient declined formal physical therapy. Patient will continue with conservative therapy and come back in 3-4 weeks. Patient actually had worsening pain she would be a candidate for viscous supplementation.  Spent greater than 25 minutes with patient face-to-face and had greater than 50% of counseling including as described above in assessment and plan.

## 2014-02-16 NOTE — Progress Notes (Signed)
CX:KGYJ knee pain followup  HPI: Onset: Patient is a 58 year old female coming in for followup of left knee pain. 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 Patient has had a corticosteroid injection greater than 4 months ago. Patient states that she was doing very well until the last month. Started having worsening pain again. Continuing on the tramadol and topical anti-inflammatories. States that it is even hurting her going up and down the stairs. Rates the pain as 9 out of 10. No new symptoms is previous old symptoms of this knee.  Patient though is also having a new problem. Patient is having more of a right hip pain. This started after the left knee pain. Even hurt when she lays on that side or touches. Worse after sitting a long amount of time and going to a standing position. Once patient started moving it seems to improve. Patient denies any radiation down the leg or any numbness or tingling. States that he can wake her up at night. Rates the severity of pain a 7 out of 10.  Patient has had x-rays previously showing mild to moderate osteophytic changes mostly in the medial compartment.  Past medical, surgical, family and social history reviewed. Medications reviewed all in the electronic medical record.   Review of Systems: No headache, visual changes, nausea, vomiting, diarrhea, constipation, dizziness, abdominal pain, skin rash, fevers, chills, night sweats, weight loss, swollen lymph nodes, body aches, joint swelling, muscle aches, chest pain, shortness of breath, mood changes.   Objective:    Blood pressure 114/78, pulse 82, height 5\' 9"  (1.753 m), weight 236 lb (107.049 kg), SpO2 97 %.   General: No apparent distress alert and oriented x3 mood and affect normal, dressed appropriately. Obese HEENT: Pupils equal, extraocular movements intact Respiratory: Patient's speak in full sentences and does not appear short of breath Cardiovascular: No lower  extremity edema, non tender, no erythema Skin: Warm dry intact with no signs of infection or rash on extremities or on axial skeleton. Abdomen: Soft nontender Neuro: Cranial nerves II through XII are intact, neurovascularly intact in all extremities with 2+ DTRs and 2+ pulses. Lymph: No lymphadenopathy of posterior or anterior cervical chain or axillae bilaterally.  Gait normal with good balance and coordination.  MSK: Non tender with full range of motion and good stability and symmetric strength and tone of shoulders, elbows, wrist,  and ankles bilaterally.   Patient is tender to palpation over the greater trochanteric bursitis but no groin pain with range of motion testing and strength. Symmetric compared to contralateral side.  MSK US performed of: Right This study was ordered, performed, and interpreted by Charlann Boxer D.O.  Hip: Trochanteric bursa with significant hypoechoic changes and swelling Acetabular labrum visualized and without tears, displacement, or effusion in joint. Femoral neck appears unremarkable without increased power doppler signal along Cortex.  IMPRESSION:  Greater trochanter bursitis   Procedure: Real-time Ultrasound Guided Injection of right greater trochanteric bursitis secondary to patient's body habitus Device: GE Logiq E  Ultrasound guided injection is preferred based studies that show increased duration, increased effect, greater accuracy, decreased procedural pain, increased response rate, and decreased cost with ultrasound guided versus blind injection.  Verbal informed consent obtained.  Time-out conducted.  Noted no overlying erythema, induration, or other signs of local infection.  Skin prepped in a sterile fashion.  Local anesthesia: Topical Ethyl chloride.  With sterile technique and under real time ultrasound guidance:  Greater trochanteric area was visualized and patient's bursa  was noted. A 22-gauge 3 inch needle was inserted and 4 cc of 0.5%  Marcaine and 1 cc of Kenalog 40 mg/dL was injected. Pictures taken Completed without difficulty  Pain immediately resolved suggesting accurate placement of the medication.  Advised to call if fevers/chills, erythema, induration, drainage, or persistent bleeding.  Images permanently stored and available for review in the ultrasound unit.  Impression: Technically successful ultrasound guided injection.     Knee: Left Normal to inspection with no erythema or effusion or obvious bony abnormalities. Patient's is tender medial joint line tenderness but less than previous exam.  ROM full in flexion and extension and lower leg rotation. Ligaments with solid consistent endpoints including ACL, PCL, LCL, MCL. Mildly positive Mcmurray's, Apley's, and Thessalonian tests.  painful patellar compression. Patellar glide with moderate crepitus. Patellar and quadriceps tendons unremarkable. Hamstring and quadriceps strength is normal.  Contralateral knee also has some medial joint line tenderness. After informed written and verbal consent, patient was seated on exam table. Left knee was prepped with alcohol swab and utilizing anterolateral approach, patient's left knee space was injected with 4:1  marcaine 0.5%: Kenalog 40mg /dL. Patient tolerated the procedure well without immediate complications.   Impression and Recommendations:     This case required medical decision making of moderate complexity.

## 2014-03-17 ENCOUNTER — Encounter: Payer: Self-pay | Admitting: Family Medicine

## 2014-03-17 ENCOUNTER — Ambulatory Visit (INDEPENDENT_AMBULATORY_CARE_PROVIDER_SITE_OTHER): Payer: Commercial Managed Care - HMO | Admitting: Family Medicine

## 2014-03-17 VITALS — BP 120/76 | HR 95 | Ht 69.0 in | Wt 233.0 lb

## 2014-03-17 DIAGNOSIS — M1712 Unilateral primary osteoarthritis, left knee: Secondary | ICD-10-CM

## 2014-03-17 NOTE — Patient Instructions (Signed)
Good to see you Pamela [name] is your friend. Especially in 6 hours.  Continue the exercises  We can consider a new brace if needed.  Start synvisc today  Will see you next week.

## 2014-03-17 NOTE — Assessment & Plan Note (Signed)
Due to patient failing other conservative therapy patientstarting on the Synvisc series. Patient will continue with the home exercises, bracing as well as icing protocol. Patient given topical anti-inflammatories as well.patient will continue conservative therapy and will come back in 1 week for second injection in the series of 3.

## 2014-03-17 NOTE — Progress Notes (Signed)
  WE:RXVQ knee pain followup  HPI: Onset: Patient is a 59 year old female coming in for followup of left knee pain. 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 Patient did have an injection at last exam. Patient states that it only lasted proximal laying one month. Sensation started having dull aching pain again. Patient has failed all other conservative therapy. Patient is failed steroid injections, home exercises, bracing, and physical therapy.    Patient has had x-rays previously showing mild to moderate osteophytic changes mostly in the medial compartment.  Past medical, surgical, family and social history reviewed. Medications reviewed all in the electronic medical record.   Review of Systems: No headache, visual changes, nausea, vomiting, diarrhea, constipation, dizziness, abdominal pain, skin rash, fevers, chills, night sweats, weight loss, swollen lymph nodes, body aches, joint swelling, muscle aches, chest pain, shortness of breath, mood changes.   Objective:    Blood pressure 120/76, pulse 95, height 5\' 9"  (1.753 m), weight 233 lb (105.688 kg), SpO2 98 %.   General: No apparent distress alert and oriented x3 mood and affect normal, dressed appropriately. Obese HEENT: Pupils equal, extraocular movements intact Respiratory: Patient's speak in full sentences and does not appear short of breath Cardiovascular: No lower extremity edema, non tender, no erythema Skin: Warm dry intact with no signs of infection or rash on extremities or on axial skeleton. Abdomen: Soft nontender Neuro: Cranial nerves II through XII are intact, neurovascularly intact in all extremities with 2+ DTRs and 2+ pulses. Lymph: No lymphadenopathy of posterior or anterior cervical chain or axillae bilaterally.  Gait normal with good balance and coordination.  MSK: Non tender with full range of motion and good stability and symmetric strength and tone of shoulders, elbows, wrist,  and  ankles bilaterally.   Patient is nontender to palpation over the greater trochanteric bursitis b that was severely tender previously.      Knee: Left Normal to inspection with no erythema or effusion or obvious bony abnormalities. Patient's is tender medial joint line tenderness  With no significant improvement.  ROM full in flexion and extension and lower leg rotation. Ligaments with solid consistent endpoints including ACL, PCL, LCL, MCL. Mildly positive Mcmurray's, Apley's, and Thessalonian tests.  painful patellar compression. Patellar glide with moderate crepitus. Patellar and quadriceps tendons unremarkable. Hamstring and quadriceps strength is normal.    Contralateral knee also has some medial joint line tenderness. After informed written and verbal consent, patient was seated on exam table. Left knee was prepped with alcohol swab and utilizing anterolateral approach, patient's left knee space was injected with 16 mg/2.5 mL of Synvisc (sodium hyaluronate) in a prefilled syringe was injected easily into the knee through a 22-gauge needle.. Patient tolerated the procedure well without immediate complications.   Impression and Recommendations:     This case required medical decision making of moderate complexity.

## 2014-03-24 ENCOUNTER — Ambulatory Visit (INDEPENDENT_AMBULATORY_CARE_PROVIDER_SITE_OTHER): Payer: Commercial Managed Care - HMO | Admitting: Family Medicine

## 2014-03-24 ENCOUNTER — Encounter: Payer: Self-pay | Admitting: Family Medicine

## 2014-03-24 VITALS — BP 122/76 | HR 91 | Ht 69.0 in | Wt 233.0 lb

## 2014-03-24 DIAGNOSIS — M1712 Unilateral primary osteoarthritis, left knee: Secondary | ICD-10-CM | POA: Diagnosis not present

## 2014-03-24 NOTE — Patient Instructions (Addendum)
Good to see you Continue the ice, exercises and brace if it helps.  One more to go!!!

## 2014-03-24 NOTE — Progress Notes (Signed)
  ER:XVQM knee pain followup  HPI: Onset: Patient is a 59 year old female coming in for followup of left knee pain. 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  Patient states that it only lasted proximal laying one month. Sensation started having dull aching pain again. Patient has failed all other conservative therapy. Patient is failed steroid injections, home exercises, bracing, and physical therapy.  Patient was given the first in a series of 3 injections of Synvisc at last visit. Patient states that there is approximately 25% decrease in pain. Patient states that she has been very happy with the results of far.    Patient has had x-rays previously showing mild to moderate osteophytic changes mostly in the medial compartment.  Past medical, surgical, family and social history reviewed. Medications reviewed all in the electronic medical record.   Review of Systems: No headache, visual changes, nausea, vomiting, diarrhea, constipation, dizziness, abdominal pain, skin rash, fevers, chills, night sweats, weight loss, swollen lymph nodes, body aches, joint swelling, muscle aches, chest pain, shortness of breath, mood changes.   Objective:    Blood pressure 122/76, pulse 91, height 5\' 9"  (1.753 m), weight 233 lb (105.688 kg), SpO2 97 %.   General: No apparent distress alert and oriented x3 mood and affect normal, dressed appropriately. Obese HEENT: Pupils equal, extraocular movements intact Respiratory: Patient's speak in full sentences and does not appear short of breath Cardiovascular: No lower extremity edema, non tender, no erythema Skin: Warm dry intact with no signs of infection or rash on extremities or on axial skeleton. Abdomen: Soft nontender Neuro: Cranial nerves II through XII are intact, neurovascularly intact in all extremities with 2+ DTRs and 2+ pulses. Lymph: No lymphadenopathy of posterior or anterior cervical chain or axillae bilaterally.  Gait  normal with good balance and coordination.  MSK: Non tender with full range of motion and good stability and symmetric strength and tone of shoulders, elbows, wrist,  and ankles bilaterally.   Patient is nontender to palpation over the greater trochanteric bursitis b that was severely tender previously.      Knee: Left Normal to inspection with no erythema or effusion or obvious bony abnormalities. Patient's is tender medial joint line tenderness  With no significant improvement.  ROM full in flexion and extension and lower leg rotation. Ligaments with solid consistent endpoints including ACL, PCL, LCL, MCL. Mildly positive Mcmurray's, Apley's, and Thessalonian tests.  painful patellar compression. Patellar glide with moderate crepitus. Patellar and quadriceps tendons unremarkable. Hamstring and quadriceps strength is normal.     After informed written and verbal consent, patient was seated on exam table. Left knee was prepped with alcohol swab and utilizing anterolateral approach, patient's left knee space was injected with 16 mg/2.5 mL of Synvisc (sodium hyaluronate) in a prefilled syringe was injected easily into the knee through a 22-gauge needle.. Patient tolerated the procedure well without immediate complications.   Impression and Recommendations:     This case required medical decision making of moderate complexity.

## 2014-03-24 NOTE — Assessment & Plan Note (Signed)
Second in the series of 3 injections. Patient will come back in 1 week for the third and final injection. In the interim patient will continue with the home exercises, icing as well as bracing if it helps.

## 2014-03-31 ENCOUNTER — Ambulatory Visit (INDEPENDENT_AMBULATORY_CARE_PROVIDER_SITE_OTHER): Payer: Commercial Managed Care - HMO | Admitting: Family Medicine

## 2014-03-31 ENCOUNTER — Encounter: Payer: Self-pay | Admitting: Family Medicine

## 2014-03-31 VITALS — BP 110/80 | HR 81 | Ht 69.0 in | Wt 233.0 lb

## 2014-03-31 DIAGNOSIS — M1712 Unilateral primary osteoarthritis, left knee: Secondary | ICD-10-CM | POA: Diagnosis not present

## 2014-03-31 NOTE — Assessment & Plan Note (Signed)
Patient and third and final injection of the Synvisc injection today. We discussed continuing the home exercises, icing protocol, as well as bracing. Patient will continue the over-the-counter supplementation. Patient and will come back and see me again in 1 month for further evaluation.

## 2014-03-31 NOTE — Patient Instructions (Signed)
Good to see you Continue the exercises and the icing.  Continue the vitamins See me again in 1 month.

## 2014-03-31 NOTE — Progress Notes (Signed)
  OQ:HUTM knee pain followup  HPI: Onset: Patient is a 59 year old female coming in for followup of left knee pain. 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  Patient states that it only lasted proximal laying one month. Sensation started having dull aching pain again. Patient has failed all other conservative therapy. Patient is failed steroid injections, home exercises, bracing, and physical therapy.  Patient has had 2 injections of the Synvisc which is a series of 3 injections. Patient states   Patient has had x-rays previously showing mild to moderate osteophytic changes mostly in the medial compartment.  Past medical, surgical, family and social history reviewed. Medications reviewed all in the electronic medical record.   Review of Systems: No headache, visual changes, nausea, vomiting, diarrhea, constipation, dizziness, abdominal pain, skin rash, fevers, chills, night sweats, weight loss, swollen lymph nodes, body aches, joint swelling, muscle aches, chest pain, shortness of breath, mood changes.   Objective:    There were no vitals taken for this visit.   General: No apparent distress alert and oriented x3 mood and affect normal, dressed appropriately. Obese HEENT: Pupils equal, extraocular movements intact Respiratory: Patient's speak in full sentences and does not appear short of breath Cardiovascular: No lower extremity edema, non tender, no erythema Skin: Warm dry intact with no signs of infection or rash on extremities or on axial skeleton. Abdomen: Soft nontender Neuro: Cranial nerves II through XII are intact, neurovascularly intact in all extremities with 2+ DTRs and 2+ pulses. Lymph: No lymphadenopathy of posterior or anterior cervical chain or axillae bilaterally.  Gait normal with good balance and coordination.  MSK: Non tender with full range of motion and good stability and symmetric strength and tone of shoulders, elbows, wrist,  and  ankles bilaterally.   Patient is nontender to palpation over the greater trochanteric bursitis b that was severely tender previously.      Knee: Left Normal to inspection with no erythema or effusion or obvious bony abnormalities. Patient's is tender medial joint line tenderness  With no significant improvement.  ROM full in flexion and extension and lower leg rotation. Ligaments with solid consistent endpoints including ACL, PCL, LCL, MCL. Mildly positive Mcmurray's, Apley's, and Thessalonian tests.  painful patellar compression. Patellar glide with moderate crepitus. Patellar and quadriceps tendons unremarkable. Hamstring and quadriceps strength is normal.     After informed written and verbal consent, patient was seated on exam table. Left knee was prepped with alcohol swab and utilizing anterolateral approach, patient's left knee space was injected with 16 mg/2.5 mL of Synvisc (sodium hyaluronate) in a prefilled syringe was injected easily into the knee through a 22-gauge needle.. Patient tolerated the procedure well without immediate complications.   Impression and Recommendations:     This case required medical decision making of moderate complexity.

## 2014-04-07 ENCOUNTER — Other Ambulatory Visit: Payer: Self-pay | Admitting: Internal Medicine

## 2014-04-07 NOTE — Telephone Encounter (Signed)
Faxed hardcopy for Zolpidem to [facility]

## 2014-04-07 NOTE — Telephone Encounter (Signed)
Done hardcopy to robin  

## 2014-04-27 ENCOUNTER — Other Ambulatory Visit: Payer: Self-pay | Admitting: Internal Medicine

## 2014-04-28 NOTE — Telephone Encounter (Signed)
Done hardcopy to rachel  

## 2014-04-28 NOTE — Telephone Encounter (Signed)
Patient requesting refill of ALPRAZolam (XANAX) 0.25 MG tablet [[id number]]

## 2014-04-28 NOTE — Telephone Encounter (Signed)
[facility] 

## 2014-05-01 ENCOUNTER — Ambulatory Visit: Payer: Medicare Other | Admitting: Family Medicine

## 2014-05-27 ENCOUNTER — Ambulatory Visit: Payer: Commercial Managed Care - HMO | Admitting: Internal Medicine

## 2014-06-09 ENCOUNTER — Ambulatory Visit: Payer: Commercial Managed Care - HMO | Admitting: Internal Medicine

## 2014-06-10 ENCOUNTER — Ambulatory Visit: Payer: Commercial Managed Care - HMO | Admitting: Internal Medicine

## 2014-06-11 ENCOUNTER — Other Ambulatory Visit: Payer: Self-pay | Admitting: Internal Medicine

## 2014-06-11 NOTE — Telephone Encounter (Signed)
Done hardcopy to cherina 

## 2014-06-11 NOTE — Telephone Encounter (Signed)
Rx done. 

## 2014-06-18 ENCOUNTER — Encounter: Payer: Self-pay | Admitting: Internal Medicine

## 2014-06-18 ENCOUNTER — Ambulatory Visit (INDEPENDENT_AMBULATORY_CARE_PROVIDER_SITE_OTHER): Payer: Commercial Managed Care - HMO | Admitting: Internal Medicine

## 2014-06-18 VITALS — BP 138/80 | HR 107 | Temp 98.5°F | Resp 18 | Ht 69.0 in | Wt 239.0 lb

## 2014-06-18 DIAGNOSIS — Z0001 Encounter for general adult medical examination with abnormal findings: Secondary | ICD-10-CM

## 2014-06-18 DIAGNOSIS — Z0000 Encounter for general adult medical examination without abnormal findings: Secondary | ICD-10-CM

## 2014-06-18 DIAGNOSIS — Z0100 Encounter for examination of eyes and vision without abnormal findings: Secondary | ICD-10-CM | POA: Diagnosis not present

## 2014-06-18 DIAGNOSIS — M25562 Pain in left knee: Secondary | ICD-10-CM

## 2014-06-18 MED ORDER — HYDROCODONE-ACETAMINOPHEN 5-325 MG PO TABS: 1.0000 | ORAL_TABLET | Freq: Four times a day (QID) | ORAL | Status: DC | PRN

## 2014-06-18 NOTE — Progress Notes (Signed)
Pre visit review using our clinic review tool, if applicable. No additional management support is needed unless otherwise documented below in the visit note. 

## 2014-06-18 NOTE — Progress Notes (Signed)
Subjective:    Patient ID: Pamela [name], female    DOB: 1955-08-06, 59 y.o.   MRN: [id number]  HPI  Here for wellness and f/u;  Overall doing ok;  Pt denies Chest pain, worsening SOB, DOE, wheezing, orthopnea, PND, worsening LE edema, palpitations, dizziness or syncope.  Pt denies neurological change such as new headache, facial or extremity weakness.  Pt denies polydipsia, polyuria, or low sugar symptoms. Pt states overall good compliance with treatment and medications, good tolerability, and has been trying to follow appropriate diet.  Pt denies worsening depressive symptoms, suicidal ideation or panic. No fever, night sweats, wt loss, loss of appetite, or other constitutional symptoms.  Pt states good ability with ADL's, has low fall risk, home safety reviewed and adequate, no other significant changes in hearing or vision, and only occasionally active with exercise. 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  Worse to first get up in the AM, also ot sit more than 1 hr. Does not want to consider surgury for now due to family stress and obligations at this time, not interested in surgury for now.   Past Medical History  Diagnosis Date  . Hypertension   . HYPERLIPIDEMIA 12/18/2006    Qualifier: History of  By: Danny Lawless CMA, [place]    . DEPRESSION, SITUATIONAL 03/15/2007    Qualifier: Diagnosis of  By: Wynona Luna   . HYPERTENSION 12/18/2006    Qualifier: Diagnosis of  By: Danny Lawless CMA, [place]    . ALLERGIC RHINITIS 12/18/2006    Qualifier: Diagnosis of  By: [place], [place]    . INSOMNIA-SLEEP DISORDER-UNSPEC 08/25/2009    Qualifier: Diagnosis of  By: Jenny Reichmann MD, Hunt Oris   . RENAL CALCULUS, HX OF 12/18/2006    Qualifier: Diagnosis of  By: [place], [place]     Past Surgical History  Procedure Laterality Date  . Cervical disc surgery      x 3    reports that she has quit  smoking. She has never used smokeless tobacco. She reports that she drinks alcohol. She reports that she does not use illicit drugs. family history includes Heart disease in her father; Stroke in her mother. No Known Allergies Current Outpatient Prescriptions on File Prior to Visit  Medication Sig Dispense Refill  . ALPRAZolam (XANAX) 0.25 MG tablet TAKE 1 TABLET TWICE DAILY AS NEEDED 60 tablet 2  . atorvastatin (LIPITOR) 20 MG tablet Take 1 tablet (20 mg total) by mouth daily. 90 tablet 3  . butalbital-acetaminophen-caffeine (FIORICET, ESGIC) 50-325-40 MG per tablet TAKE 1 TABLET TWICE A DAY 60 tablet 1  . cyclobenzaprine (FLEXERIL) 5 MG tablet take 1 tablet by mouth three times a day if needed 60 tablet 5  . cyclobenzaprine (FLEXERIL) 5 MG tablet take 1 tablet by mouth three times a day if needed 60 tablet 5  . Diclofenac Sodium 2 % SOLN Apply twice daily. 112 g 1  . DULoxetine (CYMBALTA) 60 MG capsule Take 1 capsule (60 mg total) by mouth daily. 30 capsule 11  . fluocinonide (LIDEX) 0.05 % external solution Apply topically 2 (two) times daily. 60 mL 0  . irbesartan-hydrochlorothiazide (AVALIDE) 150-12.5 MG per tablet Take 1 tablet by mouth daily. 90 tablet 1  . losartan-hydrochlorothiazide (HYZAAR) 50-12.5 MG per tablet take 1 tablet by mouth once daily 30 tablet 11  . metoCLOPramide (REGLAN)  10 MG tablet take 1 tablet by mouth four times a day if needed 120 tablet 3  . naproxen (NAPROSYN) 500 MG tablet Take 1 tablet (500 mg total) by mouth 2 (two) times daily with a meal. As needed for pain 60 tablet 2  . pantoprazole (PROTONIX) 40 MG tablet take 1 tablet by mouth once daily 90 tablet 2  . traMADol (ULTRAM) 50 MG tablet TAKE 1 TABLET BY MOUTH EVERY 6 HOURS 120 tablet 1  . VOLTAREN 1 % GEL apply 2 TO 4 TIMES A DAY if needed 100 g PRN  . zolpidem (AMBIEN) 10 MG tablet take 1 tablet by mouth at bedtime if needed for sleep 30 tablet 2   No current facility-administered medications on file prior  to visit.   Review of Systems Constitutional: Negative for increased diaphoresis, other activity, appetite or siginficant weight change other than noted HENT: Negative for worsening hearing loss, ear pain, facial swelling, mouth sores and neck stiffness.   Eyes: Negative for other worsening pain, redness or visual disturbance.  Respiratory: Negative for shortness of breath and wheezing  Cardiovascular: Negative for chest pain and palpitations.  Gastrointestinal: Negative for diarrhea, blood in stool, abdominal distention or other pain Genitourinary: Negative for hematuria, flank pain or change in urine volume.  Musculoskeletal: Negative for myalgias or other joint complaints.  Skin: Negative for color change and wound or drainage.  Neurological: Negative for syncope and numbness. other than noted Hematological: Negative for adenopathy. or other swelling Psychiatric/Behavioral: Negative for hallucinations, SI, self-injury, decreased concentration or other worsening agitation.      Objective:   Physical Exam BP 138/80 mmHg  Pulse 107  Temp(Src) 98.5 F (36.9 C) (Oral)  Resp 18  Ht 5\' 9"  (1.753 m)  Wt 239 lb (108.41 kg)  BMI 35.28 kg/m2  SpO2 97% VS noted,  Constitutional: Pt is oriented to person, place, and time. Appears well-developed and well-nourished, in no significant distress Head: Normocephalic and atraumatic.  Right Ear: External ear normal.  Left Ear: External ear normal.  Nose: Nose normal.  Mouth/Throat: Oropharynx is clear and moist.  Eyes: Conjunctivae and EOM are normal. Pupils are equal, round, and reactive to light.  Neck: Normal range of motion. Neck supple. No JVD present. No tracheal deviation present or significant neck LA or mass Cardiovascular: Normal rate, regular rhythm, normal heart sounds and intact distal pulses.   Pulmonary/Chest: Effort normal and breath sounds without rales or wheezing  Abdominal: Soft. Bowel sounds are normal. NT. No HSM    Musculoskeletal: Normal range of motion. Exhibits no edema.  Lymphadenopathy:  Has no cervical adenopathy.  Neurological: Pt is alert and oriented to person, place, and time. Pt has normal reflexes. No cranial nerve deficit. Motor grossly intact Skin: Skin is warm and dry. No rash noted.  Psychiatric:  Has normal mood and affect. Behavior is normal.  Left knee with bony degenerative change, small effusion    Assessment & Plan:

## 2014-06-18 NOTE — Patient Instructions (Addendum)
Please take all new medication as prescribed - the pain medication  Please continue all other medications as before, and refills have been done if requested.  Please have the pharmacy call with any other refills you may need.  Please continue your efforts at being more active, low cholesterol diet, and weight control.  You are otherwise up to date with prevention measures today.  Please keep your appointments with your specialists as you may have planned  Please go to the LAB in the Basement (turn left off the elevator) for the tests to be done tomorrow  You will be contacted regarding the referral for: mammogram  Please call if you would like the referral to Dr Gladstone Lighter or Dr Wynelle Link for the left knee  Please return in 1 year for your yearly visit, or sooner if needed

## 2014-06-20 NOTE — Assessment & Plan Note (Signed)
mod, for pain control,  to f/u any worsening symptoms or concerns

## 2014-06-20 NOTE — Assessment & Plan Note (Signed)

## 2014-06-22 ENCOUNTER — Other Ambulatory Visit (INDEPENDENT_AMBULATORY_CARE_PROVIDER_SITE_OTHER): Payer: Commercial Managed Care - HMO

## 2014-06-22 DIAGNOSIS — Z0189 Encounter for other specified special examinations: Secondary | ICD-10-CM | POA: Diagnosis not present

## 2014-06-22 DIAGNOSIS — Z0000 Encounter for general adult medical examination without abnormal findings: Secondary | ICD-10-CM

## 2014-06-22 LAB — CBC WITH DIFFERENTIAL/PLATELET
BASOS ABS: 0 10*3/uL (ref 0.0–0.1)
Basophils Relative: 0.6 % (ref 0.0–3.0)
Eosinophils Absolute: 0.3 10*3/uL (ref 0.0–0.7)
Eosinophils Relative: 4.1 % (ref 0.0–5.0)
HCT: 37.2 % (ref 36.0–46.0)
Hemoglobin: 12.5 g/dL (ref 12.0–15.0)
LYMPHS PCT: 19.9 % (ref 12.0–46.0)
Lymphs Abs: 1.5 10*3/uL (ref 0.7–4.0)
MCHC: 33.7 g/dL (ref 30.0–36.0)
MCV: 80.2 fl (ref 78.0–100.0)
MONO ABS: 0.7 10*3/uL (ref 0.1–1.0)
Monocytes Relative: 9.8 % (ref 3.0–12.0)
Neutro Abs: 5 10*3/uL (ref 1.4–7.7)
Neutrophils Relative %: 65.6 % (ref 43.0–77.0)
PLATELETS: 239 10*3/uL (ref 150.0–400.0)
RBC: 4.63 Mil/uL (ref 3.87–5.11)
RDW: 12.5 % (ref 11.5–15.5)
WBC: 7.6 10*3/uL (ref 4.0–10.5)

## 2014-06-22 LAB — URINALYSIS, ROUTINE W REFLEX MICROSCOPIC
Bilirubin Urine: NEGATIVE
Hgb urine dipstick: NEGATIVE
KETONES UR: NEGATIVE
Leukocytes, UA: NEGATIVE
Nitrite: NEGATIVE
PH: 6 (ref 5.0–8.0)
RBC / HPF: NONE SEEN (ref 0–?)
Specific Gravity, Urine: 1.02 (ref 1.000–1.030)
URINE GLUCOSE: NEGATIVE
UROBILINOGEN UA: 0.2 (ref 0.0–1.0)
WBC, UA: NONE SEEN (ref 0–?)

## 2014-06-22 LAB — LIPID PANEL
CHOL/HDL RATIO: 4
CHOLESTEROL: 224 mg/dL — AB (ref 0–200)
HDL: 61 mg/dL (ref >39.00)
LDL CALC: 145 mg/dL — AB (ref 0–99)
NonHDL: 163
Triglycerides: 89 mg/dL (ref 0.0–149.0)
VLDL: 17.8 mg/dL (ref 0.0–40.0)

## 2014-06-22 LAB — HEPATIC FUNCTION PANEL
ALBUMIN: 4 g/dL (ref 3.5–5.2)
ALK PHOS: 67 U/L (ref 39–117)
ALT: 16 U/L (ref 0–35)
AST: 14 U/L (ref 0–37)
Bilirubin, Direct: 0.1 mg/dL (ref 0.0–0.3)
TOTAL PROTEIN: 7.6 g/dL (ref 6.0–8.3)
Total Bilirubin: 0.4 mg/dL (ref 0.2–1.2)

## 2014-06-22 LAB — BASIC METABOLIC PANEL
BUN: 15 mg/dL (ref 6–23)
CALCIUM: 10.1 mg/dL (ref 8.4–10.5)
CHLORIDE: 101 meq/L (ref 96–112)
CO2: 31 meq/L (ref 19–32)
Creatinine, Ser: 0.86 mg/dL (ref 0.40–1.20)
GFR: 86.6 mL/min (ref >60.00)
Glucose, Bld: 111 mg/dL — ABNORMAL HIGH (ref 70–99)
Potassium: 3.8 mEq/L (ref 3.5–5.1)
SODIUM: 138 meq/L (ref 135–145)

## 2014-06-22 LAB — TSH: TSH: 1.58 u[IU]/mL (ref 0.35–4.50)

## 2014-06-23 ENCOUNTER — Encounter: Payer: Self-pay | Admitting: Internal Medicine

## 2014-06-24 ENCOUNTER — Other Ambulatory Visit: Payer: Self-pay | Admitting: Internal Medicine

## 2014-06-24 ENCOUNTER — Encounter: Payer: Self-pay | Admitting: Internal Medicine

## 2014-06-24 MED ORDER — ATORVASTATIN CALCIUM 20 MG PO TABS: 20.0000 mg | ORAL_TABLET | Freq: Every day | ORAL | Status: DC

## 2014-06-25 ENCOUNTER — Other Ambulatory Visit: Payer: Self-pay | Admitting: Internal Medicine

## 2014-06-29 ENCOUNTER — Telehealth: Payer: Self-pay | Admitting: Internal Medicine

## 2014-06-29 DIAGNOSIS — M25562 Pain in left knee: Secondary | ICD-10-CM

## 2014-06-29 NOTE — Telephone Encounter (Signed)
Patient is waiting to hear something on her [employer] referral to have her knee x-ray. It looks like the order was put in on 4/7

## 2014-06-29 NOTE — Telephone Encounter (Signed)
Patient is requesting referral to [facility]. Can you enter referral please? Thank you!

## 2014-06-29 NOTE — Telephone Encounter (Signed)
Referral done

## 2014-07-06 ENCOUNTER — Other Ambulatory Visit: Payer: Self-pay | Admitting: Internal Medicine

## 2014-07-07 MED ORDER — BUTALBITAL-APAP-CAFFEINE 50-325-40 MG PO TABS: 1.0000 | ORAL_TABLET | Freq: Two times a day (BID) | ORAL | Status: DC

## 2014-07-07 NOTE — Telephone Encounter (Signed)
Rx sent to pharmacy   

## 2014-07-07 NOTE — Telephone Encounter (Signed)
Done hardcopy to Cherina  

## 2014-07-15 DIAGNOSIS — M1712 Unilateral primary osteoarthritis, left knee: Secondary | ICD-10-CM | POA: Diagnosis not present

## 2014-07-15 DIAGNOSIS — M2242 Chondromalacia patellae, left knee: Secondary | ICD-10-CM | POA: Diagnosis not present

## 2014-07-15 DIAGNOSIS — M25562 Pain in left knee: Secondary | ICD-10-CM | POA: Diagnosis not present

## 2014-07-15 DIAGNOSIS — M2392 Unspecified internal derangement of left knee: Secondary | ICD-10-CM | POA: Diagnosis not present

## 2014-07-16 ENCOUNTER — Telehealth: Payer: Self-pay | Admitting: Internal Medicine

## 2014-07-16 DIAGNOSIS — M1712 Unilateral primary osteoarthritis, left knee: Secondary | ICD-10-CM

## 2014-07-16 NOTE — Telephone Encounter (Signed)
Ext [phone]. Patient needs [employer] referral to Dr. Paralee Cancel. Requesting is Dr. Drema Dallas from [facility].

## 2014-07-16 NOTE — Telephone Encounter (Signed)
Referral done

## 2014-07-16 NOTE — Telephone Encounter (Signed)
Regarding the not below, she needs a [facility] referral

## 2014-07-17 DIAGNOSIS — Z1231 Encounter for screening mammogram for malignant neoplasm of breast: Secondary | ICD-10-CM | POA: Diagnosis not present

## 2014-07-20 ENCOUNTER — Other Ambulatory Visit: Payer: Self-pay | Admitting: Internal Medicine

## 2014-07-21 NOTE — Telephone Encounter (Signed)
Done hardcopy to Cherina  

## 2014-07-21 NOTE — Telephone Encounter (Signed)
Rx sent to pharmacy   

## 2014-07-29 ENCOUNTER — Encounter: Payer: Self-pay | Admitting: Internal Medicine

## 2014-07-29 DIAGNOSIS — M1712 Unilateral primary osteoarthritis, left knee: Secondary | ICD-10-CM | POA: Diagnosis not present

## 2014-08-25 ENCOUNTER — Other Ambulatory Visit: Payer: Self-pay

## 2014-08-25 MED ORDER — ALPRAZOLAM 0.25 MG PO TABS: 0.2500 mg | ORAL_TABLET | Freq: Two times a day (BID) | ORAL | Status: DC | PRN

## 2014-08-25 NOTE — Telephone Encounter (Signed)
Done hardcopy to Dahlia  

## 2014-08-25 NOTE — Telephone Encounter (Signed)
Rx faxed to pharmacy  

## 2014-09-03 ENCOUNTER — Other Ambulatory Visit: Payer: Self-pay | Admitting: Internal Medicine

## 2014-09-03 NOTE — Telephone Encounter (Signed)
Done hardcopy to steph 

## 2014-09-04 NOTE — Telephone Encounter (Signed)
rx faxed

## 2014-09-10 ENCOUNTER — Other Ambulatory Visit: Payer: Self-pay | Admitting: Internal Medicine

## 2014-09-28 ENCOUNTER — Telehealth: Payer: Self-pay | Admitting: Internal Medicine

## 2014-09-28 ENCOUNTER — Encounter (HOSPITAL_COMMUNITY): Payer: Self-pay | Admitting: Emergency Medicine

## 2014-09-28 ENCOUNTER — Emergency Department (INDEPENDENT_AMBULATORY_CARE_PROVIDER_SITE_OTHER)
Admission: EM | Admit: 2014-09-28 | Discharge: 2014-09-28 | Disposition: A | Discharge status: Home / Self Care | Payer: Commercial Managed Care - HMO | Source: Home / Self Care | Attending: Family Medicine | Admitting: Family Medicine

## 2014-09-28 DIAGNOSIS — G8929 Other chronic pain: Secondary | ICD-10-CM | POA: Diagnosis not present

## 2014-09-28 DIAGNOSIS — M25562 Pain in left knee: Secondary | ICD-10-CM | POA: Diagnosis not present

## 2014-09-28 MED ORDER — HYDROCODONE-ACETAMINOPHEN 5-325 MG PO TABS: 1.0000 | ORAL_TABLET | Freq: Four times a day (QID) | ORAL | Status: DC | PRN

## 2014-09-28 NOTE — Discharge Instructions (Signed)
We cannot give more pain medicine, see your doctor if further problems.

## 2014-09-28 NOTE — ED Provider Notes (Signed)
CSN: [id number]     Arrival date & time 09/28/14  1307 History   First MD Initiated Contact with Patient 09/28/14 1447     Chief Complaint  Patient presents with  . Knee Pain   (Consider location/radiation/quality/duration/timing/severity/associated sxs/prior Treatment) Patient is a 59 y.o. female presenting with knee pain. The history is provided by the patient.  Knee Pain Location:  Knee Injury: no   Knee location:  L knee Pain details:    Quality:  Sharp   Severity:  Moderate   Progression:  Worsening Chronicity:  Chronic (care by dr Drema Dallas and dr Alvan Dame at [facility], has appt 8/4., requesting pain med until then.) Dislocation: no   Prior injury to area:  No Relieved by: has knee brace given which helps. Associated symptoms: decreased ROM and swelling     Past Medical History  Diagnosis Date  . Hypertension   . HYPERLIPIDEMIA 12/18/2006    Qualifier: History of  By: Danny Lawless CMA, [place]    . DEPRESSION, SITUATIONAL 03/15/2007    Qualifier: Diagnosis of  By: Wynona Luna   . HYPERTENSION 12/18/2006    Qualifier: Diagnosis of  By: Danny Lawless CMA, [place]    . ALLERGIC RHINITIS 12/18/2006    Qualifier: Diagnosis of  By: [place], [place]    . INSOMNIA-SLEEP DISORDER-UNSPEC 08/25/2009    Qualifier: Diagnosis of  By: Jenny Reichmann MD, Hunt Oris   . RENAL CALCULUS, HX OF 12/18/2006    Qualifier: Diagnosis of  By: [place], [place]     Past Surgical History  Procedure Laterality Date  . Cervical disc surgery      x 3   Family History  Problem Relation Age of Onset  . Stroke Mother   . Heart disease Father    History  Substance Use Topics  . Smoking status: Former Research scientist (life sciences)  . Smokeless tobacco: Never Used  . Alcohol Use: No     Comment: occasionally   OB History    No data available     Review of Systems  Constitutional: Negative.   Musculoskeletal: Positive for joint swelling and gait problem.  Skin: Negative.     Allergies  Review of patient's allergies indicates no  known allergies.  Home Medications   Prior to Admission medications   Medication Sig Start Date End Date Taking? Authorizing Provider  ALPRAZolam (XANAX) 0.25 MG tablet Take 1 tablet (0.25 mg total) by mouth 2 (two) times daily as needed. 08/25/14   Biagio Borg, MD  atorvastatin (LIPITOR) 20 MG tablet Take 1 tablet (20 mg total) by mouth daily. 06/24/14 06/24/15  Biagio Borg, MD  butalbital-acetaminophen-caffeine (FIORICET, ESGIC) [phone] MG per tablet Take 1 tablet by mouth 2 (two) times daily. 07/07/14   Biagio Borg, MD  cyclobenzaprine (FLEXERIL) 5 MG tablet take 1 tablet by mouth three times a day if needed 03/07/13   Biagio Borg, MD  cyclobenzaprine (FLEXERIL) 5 MG tablet take 1 tablet by mouth three times a day if needed 11/03/13   Biagio Borg, MD  Diclofenac Sodium 2 % SOLN Apply twice daily. 10/10/13   Lyndal Pulley, DO  DULoxetine (CYMBALTA) 60 MG capsule take 1 capsule by mouth once daily 09/11/14   Biagio Borg, MD  fluocinonide (LIDEX) 0.05 % external solution Apply topically 2 (two) times daily. 04/03/12   Biagio Borg, MD  HYDROcodone-acetaminophen (NORCO/VICODIN) 5-325 MG per tablet Take 1 tablet by mouth every 6 (six) hours as needed. 09/28/14   Jeneen Rinks  Damian Leavell, MD  irbesartan-hydrochlorothiazide (AVALIDE) 150-12.5 MG per tablet Take 1 tablet by mouth daily. 04/02/13   Biagio Borg, MD  losartan-hydrochlorothiazide [facility]) 50-12.5 MG per tablet take 1 tablet by mouth once daily 11/11/13   Biagio Borg, MD  metoCLOPramide (REGLAN) 10 MG tablet take 1 tablet by mouth four times a day if needed 01/02/14   Biagio Borg, MD  naproxen (NAPROSYN) 500 MG tablet take 1 tablet by mouth twice a day with food if needed for pain 07/07/14   Biagio Borg, MD  pantoprazole (PROTONIX) 40 MG tablet take 1 tablet by mouth once daily 02/10/14   Biagio Borg, MD  traMADol (ULTRAM) 50 MG tablet TAKE 1 TABLET BY MOUTH EVERY 6 HOURS 09/03/14   Biagio Borg, MD  VOLTAREN 1 % GEL APPLY TO AFFECTED AREAS UPTO 3  TIMES A DAY AS NEEDED 06/25/14   Biagio Borg, MD  zolpidem (AMBIEN) 10 MG tablet take 1 tablet by mouth at bedtime if needed for sleep 07/21/14   Biagio Borg, MD   BP 144/88 mmHg  Pulse 89  Temp(Src) 98.1 F (36.7 C) (Oral)  Resp 16  SpO2 97% Physical Exam  Constitutional: She is oriented to person, place, and time. She appears well-developed and well-nourished. No distress.  Musculoskeletal: She exhibits tenderness.       Left knee: She exhibits decreased range of motion and bony tenderness. She exhibits no swelling, no effusion, no deformity, normal patellar mobility and no MCL laxity. Tenderness found. Medial joint line and MCL tenderness noted.  Neurological: She is alert and oriented to person, place, and time.  Skin: Skin is warm and dry.  Nursing note and vitals reviewed.   ED Course  Procedures (including critical care time) Labs Review Labs Reviewed - No data to display  Imaging Review No results found.   MDM   1. Knee pain, chronic, left        Billy Fischer, MD 09/28/14 1506

## 2014-09-28 NOTE — ED Notes (Signed)
Left knee pain, history of the same.  Patient reports onset of issues with left knee since 2008.  Patient is under the care of [facility].  Reports she has an appointment august 4.  Patient asking for pain medicine until she is seen by pcp

## 2014-09-28 NOTE — Telephone Encounter (Signed)
Please advise, thanks.

## 2014-09-28 NOTE — Telephone Encounter (Signed)
Patient states she would like something for pain in knee to be sent to [facility] on [address].  I did inform patient that Dr. Jenny Reichmann does not do pain management.

## 2014-10-01 NOTE — Telephone Encounter (Signed)
See below

## 2014-10-01 NOTE — Telephone Encounter (Signed)
I would not feel comfortable with more medication at this time, as she just received hydrocodone per Dr Juventino Slovak, and has cymbalta and voltaren gel  Pt could consider f/u with Dr Tamala Julian in this office if pain persists

## 2014-10-02 ENCOUNTER — Other Ambulatory Visit: Payer: Self-pay

## 2014-10-02 MED ORDER — DULOXETINE HCL 60 MG PO CPEP: 60.0000 mg | ORAL_CAPSULE | Freq: Every day | ORAL | Status: DC

## 2014-10-02 MED ORDER — DICLOFENAC SODIUM 1 % TD GEL: TRANSDERMAL | Status: DC

## 2014-10-02 NOTE — Telephone Encounter (Signed)
cymbalta and voltaren gel are the 2 meds patient was needing refills on, office visit was in April/2016---both rx's sent to [facility] on [address]--patient advised

## 2014-10-15 DIAGNOSIS — M25562 Pain in left knee: Secondary | ICD-10-CM | POA: Diagnosis not present

## 2014-10-15 DIAGNOSIS — M1712 Unilateral primary osteoarthritis, left knee: Secondary | ICD-10-CM | POA: Diagnosis not present

## 2014-10-15 DIAGNOSIS — M2242 Chondromalacia patellae, left knee: Secondary | ICD-10-CM | POA: Diagnosis not present

## 2014-10-17 ENCOUNTER — Other Ambulatory Visit: Payer: Self-pay | Admitting: Internal Medicine

## 2014-10-19 NOTE — Telephone Encounter (Signed)
Please advise, thanks.

## 2014-10-20 NOTE — Telephone Encounter (Signed)
Rx faxed to pharmacy  

## 2014-10-20 NOTE — Telephone Encounter (Signed)
Done hardcopy to Dahlia  

## 2014-10-26 ENCOUNTER — Other Ambulatory Visit: Payer: Self-pay | Admitting: Internal Medicine

## 2014-11-05 DIAGNOSIS — M1712 Unilateral primary osteoarthritis, left knee: Secondary | ICD-10-CM | POA: Diagnosis not present

## 2014-11-05 DIAGNOSIS — G8918 Other acute postprocedural pain: Secondary | ICD-10-CM | POA: Diagnosis not present

## 2014-11-05 DIAGNOSIS — M179 Osteoarthritis of knee, unspecified: Secondary | ICD-10-CM | POA: Diagnosis not present

## 2014-11-08 ENCOUNTER — Other Ambulatory Visit: Payer: Self-pay | Admitting: Internal Medicine

## 2014-11-10 DIAGNOSIS — M1712 Unilateral primary osteoarthritis, left knee: Secondary | ICD-10-CM | POA: Diagnosis not present

## 2014-11-12 ENCOUNTER — Other Ambulatory Visit: Payer: Self-pay | Admitting: Internal Medicine

## 2014-11-13 DIAGNOSIS — M1712 Unilateral primary osteoarthritis, left knee: Secondary | ICD-10-CM | POA: Diagnosis not present

## 2014-11-18 ENCOUNTER — Other Ambulatory Visit: Payer: Self-pay | Admitting: Internal Medicine

## 2014-11-20 DIAGNOSIS — M1712 Unilateral primary osteoarthritis, left knee: Secondary | ICD-10-CM | POA: Diagnosis not present

## 2014-11-26 ENCOUNTER — Other Ambulatory Visit: Payer: Self-pay | Admitting: Internal Medicine

## 2014-11-26 NOTE — Telephone Encounter (Signed)
Please advise, thanks.

## 2014-11-26 NOTE — Telephone Encounter (Signed)
Done hardcopy to stephanie 

## 2014-11-26 NOTE — Telephone Encounter (Signed)
Rx faxed to pharmacy  

## 2014-11-27 DIAGNOSIS — M1712 Unilateral primary osteoarthritis, left knee: Secondary | ICD-10-CM | POA: Diagnosis not present

## 2014-12-07 ENCOUNTER — Other Ambulatory Visit: Payer: Self-pay | Admitting: Internal Medicine

## 2014-12-08 NOTE — Telephone Encounter (Signed)
Rx faxed to pharmacy  

## 2014-12-08 NOTE — Telephone Encounter (Signed)
Done hardcopy to Dahlia  

## 2014-12-15 DIAGNOSIS — M1712 Unilateral primary osteoarthritis, left knee: Secondary | ICD-10-CM | POA: Diagnosis not present

## 2014-12-18 DIAGNOSIS — M1712 Unilateral primary osteoarthritis, left knee: Secondary | ICD-10-CM | POA: Diagnosis not present

## 2014-12-24 DIAGNOSIS — M1712 Unilateral primary osteoarthritis, left knee: Secondary | ICD-10-CM | POA: Diagnosis not present

## 2014-12-25 DIAGNOSIS — Z471 Aftercare following joint replacement surgery: Secondary | ICD-10-CM | POA: Diagnosis not present

## 2014-12-25 DIAGNOSIS — Z96652 Presence of left artificial knee joint: Secondary | ICD-10-CM | POA: Diagnosis not present

## 2015-01-28 ENCOUNTER — Other Ambulatory Visit: Payer: Self-pay | Admitting: Internal Medicine

## 2015-01-29 NOTE — Telephone Encounter (Signed)
Done hardcopy to Dahlia  

## 2015-01-29 NOTE — Telephone Encounter (Signed)
Rx faxed to pharmacy  

## 2015-03-04 ENCOUNTER — Other Ambulatory Visit: Payer: Self-pay | Admitting: Internal Medicine

## 2015-04-02 ENCOUNTER — Other Ambulatory Visit: Payer: Self-pay | Admitting: Internal Medicine

## 2015-04-02 NOTE — Telephone Encounter (Signed)
Rx faxed to pharmacy  

## 2015-04-02 NOTE — Telephone Encounter (Signed)
Done hardcopy to dahlia 

## 2015-04-13 ENCOUNTER — Telehealth: Payer: Self-pay | Admitting: Internal Medicine

## 2015-04-13 MED ORDER — DEXLANSOPRAZOLE 60 MG PO CPDR: 60.0000 mg | DELAYED_RELEASE_CAPSULE | Freq: Every day | ORAL | Status: DC

## 2015-04-13 NOTE — Telephone Encounter (Signed)
Ok to try, but if requires a PA, she may need referred to GI to establish the need for this  Ok to stop the protonix

## 2015-04-13 NOTE — Telephone Encounter (Signed)
Patient called asking if Dr. Jenny Reichmann would put her back on dexilant. She uses [facility] on [address]. CB# [phone]

## 2015-04-13 NOTE — Telephone Encounter (Signed)
Please advise patient would like to be put back on dexilant.

## 2015-04-16 ENCOUNTER — Other Ambulatory Visit: Payer: Commercial Managed Care - HMO

## 2015-04-16 ENCOUNTER — Encounter: Payer: Self-pay | Admitting: Internal Medicine

## 2015-04-16 ENCOUNTER — Ambulatory Visit (INDEPENDENT_AMBULATORY_CARE_PROVIDER_SITE_OTHER): Payer: Commercial Managed Care - HMO | Admitting: Internal Medicine

## 2015-04-16 VITALS — BP 130/80 | HR 85 | Temp 98.5°F | Resp 20 | Wt 215.0 lb

## 2015-04-16 DIAGNOSIS — R634 Abnormal weight loss: Secondary | ICD-10-CM | POA: Diagnosis not present

## 2015-04-16 DIAGNOSIS — R6881 Early satiety: Secondary | ICD-10-CM

## 2015-04-16 DIAGNOSIS — F4321 Adjustment disorder with depressed mood: Secondary | ICD-10-CM

## 2015-04-16 DIAGNOSIS — R1010 Upper abdominal pain, unspecified: Secondary | ICD-10-CM | POA: Diagnosis not present

## 2015-04-16 DIAGNOSIS — R300 Dysuria: Secondary | ICD-10-CM

## 2015-04-16 LAB — POCT URINALYSIS DIPSTICK
Bilirubin, UA: NEGATIVE
Glucose, UA: NEGATIVE
KETONES UA: NEGATIVE
Nitrite, UA: NEGATIVE
PH UA: 6
PROTEIN UA: NEGATIVE
SPEC GRAV UA: 1.025
UROBILINOGEN UA: NEGATIVE

## 2015-04-16 MED ORDER — DULOXETINE HCL 60 MG PO CPEP: 60.0000 mg | ORAL_CAPSULE | Freq: Every day | ORAL | Status: DC

## 2015-04-16 MED ORDER — SULFAMETHOXAZOLE-TRIMETHOPRIM 800-160 MG PO TABS: 1.0000 | ORAL_TABLET | Freq: Two times a day (BID) | ORAL | Status: DC

## 2015-04-16 NOTE — Assessment & Plan Note (Signed)
Etiology unclear, for GI referral - ? Need EGD

## 2015-04-16 NOTE — Assessment & Plan Note (Addendum)
Despite benign exam, hx and Udip c/w prob uti , cant r/o early pyelo with recent flank symptoms - Mild to mod, for antibx course,  to f/u any worsening symptoms or concerns  Note:  Total time for pt hx, exam, review of record with pt in the room, determination of diagnoses and plan for further eval and tx is > 40 min, with over 50% spent in coordination and counseling of patient

## 2015-04-16 NOTE — Assessment & Plan Note (Signed)
Likewise unsual and significant, exam benign but refer GI

## 2015-04-16 NOTE — Progress Notes (Signed)
Pre visit review using our clinic review tool, if applicable. No additional management support is needed unless otherwise documented below in the visit note. 

## 2015-04-16 NOTE — Patient Instructions (Addendum)
Please take all new medication as prescribed - the antibiotic  Your specimen will be sent for culture as well  You will be contacted regarding the referral for: Gastroenterology  Please continue all other medications as before, and refills have been done if requested - the cymbalta  Please have the pharmacy call with any other refills you may need.  Please keep your appointments with your specialists as you may have planned

## 2015-04-16 NOTE — Assessment & Plan Note (Signed)
Worsening, no SI or HI, for

## 2015-04-16 NOTE — Progress Notes (Signed)
Subjective:    Patient ID: Pamela [name], female    DOB: 1955-12-03, 60 y.o.   MRN: [id number]  HPI  Here to f/u with c/o 2-3 days dysuria with fever, chills and strong odor to urination worse today and right flank pain last PM but Denies urinary symptoms such as frequency, urgency, left flank pain, hematuria or n/v.  Also mentions GI c/o for 2 mo however as well, somewhat vague but seems to have more upper abd funny feeling, early satiety, and unusual wt loss;  Wt Readings from Last 3 Encounters:  04/16/15 215 lb (97.523 kg)  06/18/14 239 lb (108.41 kg)  03/31/14 233 lb (105.688 kg)  Pt denies chest pain, increased sob or doe, wheezing, orthopnea, PND, increased LE swelling, palpitations, dizziness or syncope.  Pt denies new neurological symptoms such as new headache, or facial or extremity weakness or numbness   Pt denies polydipsia, polyuria.  Denies hyper or hypo thyroid symptoms such as voice, skin or hair change.  Also had stopped her cymbalta some months, but with worsening depression symptoms last few weeks as well, re-starting cymbalta today, needs refill. No SI or HI or panic symptoms Past Medical History  Diagnosis Date  . Hypertension   . HYPERLIPIDEMIA 12/18/2006    Qualifier: History of  By: Danny Lawless CMA, [place]    . DEPRESSION, SITUATIONAL 03/15/2007    Qualifier: Diagnosis of  By: Wynona Luna   . HYPERTENSION 12/18/2006    Qualifier: Diagnosis of  By: Danny Lawless CMA, [place]    . ALLERGIC RHINITIS 12/18/2006    Qualifier: Diagnosis of  By: [place], [place]    . INSOMNIA-SLEEP DISORDER-UNSPEC 08/25/2009    Qualifier: Diagnosis of  By: Jenny Reichmann MD, Hunt Oris   . RENAL CALCULUS, HX OF 12/18/2006    Qualifier: Diagnosis of  By: [place], [place]     Past Surgical History  Procedure Laterality Date  . Cervical disc surgery      x 3    reports that she has quit smoking. She has never used smokeless tobacco. She reports that she does not drink alcohol or use illicit drugs. family  history includes Heart disease in her father; Stroke in her mother. No Known Allergies' Current Outpatient Prescriptions on File Prior to Visit  Medication Sig Dispense Refill  . ALPRAZolam (XANAX) 0.25 MG tablet take 1 tablet by mouth twice a day if needed 60 tablet 2  . atorvastatin (LIPITOR) 20 MG tablet Take 1 tablet (20 mg total) by mouth daily. 90 tablet 3  . butalbital-acetaminophen-caffeine (FIORICET, ESGIC) 50-325-40 MG tablet take 1 tablet by mouth twice a day 60 tablet 1  . cyclobenzaprine (FLEXERIL) 5 MG tablet take 1 tablet by mouth three times a day if needed 60 tablet 5  . cyclobenzaprine (FLEXERIL) 5 MG tablet take 1 tablet by mouth three times a day if needed 60 tablet 3  . dexlansoprazole (DEXILANT) 60 MG capsule Take 1 capsule (60 mg total) by mouth daily. 90 capsule 3  . diclofenac sodium (VOLTAREN) 1 % GEL APPLY TO AFFECTED AREAS UPTO 3 TIMES A DAY AS NEEDED 100 g 1  . Diclofenac Sodium 2 % SOLN Apply twice daily. 112 g 1  . fluocinonide (LIDEX) 0.05 % external solution Apply topically 2 (two) times daily. 60 mL 0  . HYDROcodone-acetaminophen (NORCO/VICODIN) 5-325 MG per tablet Take 1 tablet by mouth every 6 (six) hours as needed. 15 tablet 0  . irbesartan-hydrochlorothiazide (AVALIDE) 150-12.5 MG per tablet Take 1  tablet by mouth daily. 90 tablet 1  . losartan-hydrochlorothiazide (HYZAAR) 50-12.5 MG per tablet take 1 tablet by mouth once daily 30 tablet 6  . losartan-hydrochlorothiazide (HYZAAR) 50-12.5 MG per tablet take 1 tablet by mouth once daily 30 tablet 5  . metoCLOPramide (REGLAN) 10 MG tablet take 1 tablet by mouth four times a day if needed 120 tablet 3  . naproxen (NAPROSYN) 500 MG tablet take 1 tablet by mouth twice a day with food if needed for pain 60 tablet 2  . traMADol (ULTRAM) 50 MG tablet TAKE 1 TABLET BY MOUTH EVERY 6 HOURS 120 tablet 1  . zolpidem (AMBIEN) 10 MG tablet take 1 tablet by mouth at bedtime if needed for sleep 30 tablet 5   No current  facility-administered medications on file prior to visit.   Review of Systems  Constitutional: Negative for unusual diaphoresis or night sweats HENT: Negative for ringing in ear or discharge Eyes: Negative for double vision or worsening visual disturbance.  Respiratory: Negative for choking and stridor.   Gastrointestinal: Negative for vomiting or other signifcant bowel change Genitourinary: Negative for hematuria or change in urine volume.  Musculoskeletal: Negative for other MSK pain or swelling Skin: Negative for color change and worsening wound.  Neurological: Negative for tremors and numbness other than noted  Psychiatric/Behavioral: Negative for decreased concentration or agitation other than above       Objective:   Physical Exam BP 130/80 mmHg  Pulse 85  Temp(Src) 98.5 F (36.9 C) (Oral)  Resp 20  Wt 215 lb (97.523 kg)  SpO2 97% VS noted, fatigued, mild ill appearing Constitutional: Pt appears in no significant distress HENT: Head: NCAT.  Right Ear: External ear normal.  Left Ear: External ear normal.  Eyes: . Pupils are equal, round, and reactive to light. Conjunctivae and EOM are normal Neck: Normal range of motion. Neck supple.  Cardiovascular: Normal rate and regular rhythm.   Pulmonary/Chest: Effort normal and breath sounds without rales or wheezing.  Abd:  Soft, NT, ND, + BS - overall surprisingly benign exam, no flank tender Neurological: Pt is alert. Not confused , motor grossly intact Skin: Skin is warm. No rash, no LE edema Psychiatric: Pt behavior is normal. No agitation. + depressed affect  POCT Urinalysis Dipstick  Status: Finalresult Visible to patient:  Not Released Dx:  Dysuria              Ref Range 4:39 PM  25mo ago  37yr ago  8yr ago     Color, UA  yellow       Clarity, UA  cloudy       Glucose, UA  negative       Bilirubin, UA  negative       Ketones, UA  negative       Spec Grav, UA  1.025       Blood, UA  small        pH, UA  6.0       Protein, UA  negative       Urobilinogen, UA  negative 0.2 0.2 0.2    Nitrite, UA  negative       Leukocytes, UA Negative  small (1+) (A)               Assessment & Plan:

## 2015-04-16 NOTE — Assessment & Plan Note (Signed)
Etiology unclear, for refer GI

## 2015-04-20 ENCOUNTER — Encounter: Payer: Self-pay | Admitting: Gastroenterology

## 2015-04-21 ENCOUNTER — Encounter: Payer: Self-pay | Admitting: Internal Medicine

## 2015-04-21 LAB — CULTURE, URINE COMPREHENSIVE: Colony Count: >=100000

## 2015-04-22 ENCOUNTER — Telehealth: Payer: Self-pay

## 2015-04-22 NOTE — Telephone Encounter (Signed)
Too soon for xanax, since last rx was Apr 02, 2015 for total 3 mo tx

## 2015-04-22 NOTE — Telephone Encounter (Signed)
Please advise, patient needs refill of xanax

## 2015-04-27 ENCOUNTER — Other Ambulatory Visit: Payer: Self-pay | Admitting: General Practice

## 2015-04-27 ENCOUNTER — Telehealth: Payer: Self-pay | Admitting: General Practice

## 2015-04-27 MED ORDER — ZOLPIDEM TARTRATE 10 MG PO TABS
ORAL_TABLET | ORAL | Status: DC
Start: 2015-04-27 — End: 2015-07-15

## 2015-04-27 NOTE — Telephone Encounter (Signed)
Done hardcopy to Corinne  

## 2015-04-27 NOTE — Telephone Encounter (Signed)
rx faxed

## 2015-05-04 ENCOUNTER — Ambulatory Visit (INDEPENDENT_AMBULATORY_CARE_PROVIDER_SITE_OTHER): Payer: Commercial Managed Care - HMO | Admitting: Gastroenterology

## 2015-05-04 ENCOUNTER — Encounter: Payer: Self-pay | Admitting: Gastroenterology

## 2015-05-04 VITALS — BP 140/90 | HR 100 | Ht 69.0 in | Wt 219.8 lb

## 2015-05-04 DIAGNOSIS — R634 Abnormal weight loss: Secondary | ICD-10-CM | POA: Diagnosis not present

## 2015-05-04 DIAGNOSIS — R6881 Early satiety: Secondary | ICD-10-CM

## 2015-05-04 DIAGNOSIS — R1310 Dysphagia, unspecified: Secondary | ICD-10-CM

## 2015-05-04 NOTE — Patient Instructions (Signed)
You should change the way you are taking your antiacid medicine (pantoprazole) so that you are taking it 20-30 minutes prior to a decent meal as that is the way the pill is designed to work most effectively. Stop the reglan. You will be set up for an upper endoscopy for dysphagia, weight loss, early satiety.

## 2015-05-04 NOTE — Progress Notes (Signed)
Review of pertinent gastrointestinal problems: 1. Routine risk for colon cancer:  Routine screening colonoscopy 02/2007 Dr. Ardis Hughs found single small HP polyp, was recommended to have repeat screening in 10 years.   HPI: This is a  very pleasant 60 year old woman     who was referred to me by Biagio Borg, MD  to evaluate  anorexia, abdominal discomfort, weight loss, dysphagia .    Chief complaint is anorexia, weight loss, abdominal discomfort  Several months ago she was told she had acid reflux; had early satiety.  Was given antiacid medicine dexilant with good relief. WEight increased and she was eating better.  Insurance changed and so PPI changed  EAts poorly.  Has been losing weight 10 pounds over several months.  Dysphagia to solids as well.  Med list below is not correct, she's actually on protonix 40 in AM, does not eat much afterwards.  Also on reglan 10 qid.  Discomfort in epigasatrium, like upset stomach. Mild nausea.  Used to take goody's several years ago.    Alkaseltzer, every other day now.   From recent PCP note: 04/16/15 215 lb (97.523 kg)  06/18/14 239 lb (108.41 kg)  03/31/14 233 lb (105.688 kg)           Review of systems: Pertinent positive and negative review of systems were noted in the above HPI section. Complete review of systems was performed and was otherwise normal.   Past Medical History  Diagnosis Date  . Hypertension   . HYPERLIPIDEMIA 12/18/2006    Qualifier: History of  By: Danny Lawless CMA, [place]    . DEPRESSION, SITUATIONAL 03/15/2007    Qualifier: Diagnosis of  By: Wynona Luna   . HYPERTENSION 12/18/2006    Qualifier: Diagnosis of  By: Danny Lawless CMA, [place]    . ALLERGIC RHINITIS 12/18/2006    Qualifier: Diagnosis of  By: [place], [place]    . INSOMNIA-SLEEP DISORDER-UNSPEC 08/25/2009    Qualifier: Diagnosis of  By: Jenny Reichmann MD, [place]   . RENAL CALCULUS, HX OF 12/18/2006    Qualifier: Diagnosis of  By: [place], [place]       Past Surgical History  Procedure Laterality Date  . Cervical disc surgery      x 3  . Knee arthroscopy      partial    Current Outpatient Prescriptions  Medication Sig Dispense Refill  . ALPRAZolam (XANAX) 0.25 MG tablet take 1 tablet by mouth twice a day if needed 60 tablet 2  . atorvastatin (LIPITOR) 20 MG tablet Take 1 tablet (20 mg total) by mouth daily. 90 tablet 3  . butalbital-acetaminophen-caffeine (FIORICET, ESGIC) 50-325-40 MG tablet take 1 tablet by mouth twice a day 60 tablet 1  . cyclobenzaprine (FLEXERIL) 5 MG tablet take 1 tablet by mouth three times a day if needed 60 tablet 3  . dexlansoprazole (DEXILANT) 60 MG capsule Take 1 capsule (60 mg total) by mouth daily. 90 capsule 3  . diclofenac sodium (VOLTAREN) 1 % GEL APPLY TO AFFECTED AREAS UPTO 3 TIMES A DAY AS NEEDED 100 g 1  . DULoxetine (CYMBALTA) 60 MG capsule Take 1 capsule (60 mg total) by mouth daily. 30 capsule 11  . fluocinonide (LIDEX) 0.05 % external solution Apply topically 2 (two) times daily. 60 mL 0  . HYDROcodone-acetaminophen (NORCO/VICODIN) 5-325 MG per tablet Take 1 tablet by mouth every 6 (six) hours as needed. 15 tablet 0  . irbesartan-hydrochlorothiazide (AVALIDE) 150-12.5 MG per tablet Take 1 tablet by mouth  daily. 90 tablet 1  . losartan-hydrochlorothiazide (HYZAAR) 50-12.5 MG per tablet take 1 tablet by mouth once daily 30 tablet 5  . metoCLOPramide (REGLAN) 10 MG tablet take 1 tablet by mouth four times a day if needed 120 tablet 3  . naproxen (NAPROSYN) 500 MG tablet take 1 tablet by mouth twice a day with food if needed for pain 60 tablet 2  . sulfamethoxazole-trimethoprim (BACTRIM DS,SEPTRA DS) 800-160 MG tablet Take 1 tablet by mouth 2 (two) times daily. 20 tablet 0  . traMADol (ULTRAM) 50 MG tablet TAKE 1 TABLET BY MOUTH EVERY 6 HOURS 120 tablet 1  . zolpidem (AMBIEN) 10 MG tablet take 1 tablet by mouth at bedtime if needed for sleep 30 tablet 5   No current facility-administered  medications for this visit.    Allergies as of 05/04/2015  . (No Known Allergies)    Family History  Problem Relation Age of Onset  . Stroke Mother   . Heart disease Father   . Lung cancer Sister   . Lung cancer Brother     Social History   Social History  . Marital Status: Married    Spouse Name: N/A  . Number of Children: 3  . Years of Education: N/A   Occupational History  . Not on file.   Social History Main Topics  . Smoking status: Former Research scientist (life sciences)  . Smokeless tobacco: Never Used  . Alcohol Use: No     Comment: occasionally  . Drug Use: No  . Sexual Activity: Not on file   Other Topics Concern  . Not on file   Social History Narrative     Physical Exam: BP 140/90 mmHg  Pulse 100  Ht 5\' 9"  (1.753 m)  Wt 219 lb 12.8 oz (99.701 kg)  BMI 32.44 kg/m2 Constitutional: generally well-appearing Psychiatric: alert and oriented x3 Eyes: extraocular movements intact Mouth: oral pharynx moist, no lesions Neck: supple no lymphadenopathy Cardiovascular: heart regular rate and rhythm Lungs: clear to auscultation bilaterally Abdomen: soft, nontender, nondistended, no obvious ascites, no peritoneal signs, normal bowel sounds Extremities: no lower extremity edema bilaterally Skin: no lesions on visible extremities   Assessment and plan: 60 y.o. female with  anorexia, weight loss, intermittent dysphasia  She does take Alka-Seltzer every other day and that may be causing some gastric irritation. I recommended she change the way she is taking her pantoprazole also that it is shortly before a meal as that is the way the medicine is designed to work most effectively. I don't think Reglan as necessary and so I discontinued this from her medication list. We will schedule her for upper endoscopy at her soonest convenience to check for infection, gastritis, neoplasm. If that is not helpful test that I would likely proceed with that abdominal imaging and lab testing.   Owens Loffler, MD [facility] 05/04/2015, 9:53 AM  Cc: Biagio Borg, MD

## 2015-05-05 ENCOUNTER — Encounter: Payer: Self-pay | Admitting: Gastroenterology

## 2015-05-05 ENCOUNTER — Ambulatory Visit (AMBULATORY_SURGERY_CENTER): Payer: Commercial Managed Care - HMO | Admitting: Gastroenterology

## 2015-05-05 VITALS — BP 136/86 | HR 80 | Temp 96.8°F | Resp 20 | Ht 69.0 in | Wt 219.0 lb

## 2015-05-05 DIAGNOSIS — E669 Obesity, unspecified: Secondary | ICD-10-CM | POA: Diagnosis not present

## 2015-05-05 DIAGNOSIS — R6881 Early satiety: Secondary | ICD-10-CM | POA: Diagnosis not present

## 2015-05-05 DIAGNOSIS — K2990 Gastroduodenitis, unspecified, without bleeding: Secondary | ICD-10-CM

## 2015-05-05 DIAGNOSIS — K2950 Unspecified chronic gastritis without bleeding: Secondary | ICD-10-CM | POA: Diagnosis not present

## 2015-05-05 DIAGNOSIS — K2970 Gastritis, unspecified, without bleeding: Secondary | ICD-10-CM | POA: Diagnosis not present

## 2015-05-05 DIAGNOSIS — R1310 Dysphagia, unspecified: Secondary | ICD-10-CM | POA: Diagnosis not present

## 2015-05-05 DIAGNOSIS — I10 Essential (primary) hypertension: Secondary | ICD-10-CM | POA: Diagnosis not present

## 2015-05-05 DIAGNOSIS — R634 Abnormal weight loss: Secondary | ICD-10-CM | POA: Diagnosis not present

## 2015-05-05 DIAGNOSIS — Q394 Esophageal web: Secondary | ICD-10-CM

## 2015-05-05 MED ORDER — SODIUM CHLORIDE 0.9 % IV SOLN: 500.0000 mL | INTRAVENOUS | Status: DC

## 2015-05-05 NOTE — Op Note (Signed)
[place]  [address] [place], [address]   ENDOSCOPY PROCEDURE REPORT  PATIENT: Pamela [name], Pamela [name]  MR#: [id number] BIRTHDATE: 02-01-1956 , 59  yrs. old GENDER: female ENDOSCOPIST: Milus Banister, MD REFERRED BY:  Cathlean Cower, M.D. PROCEDURE DATE:  05/05/2015 PROCEDURE:  EGD w/ biopsy and EGD w/ balloon dilation ASA CLASS:     Class II INDICATIONS:  dysphagia, weight loss, early satiety. MEDICATIONS: Monitored anesthesia care and Propofol 270 mg IV TOPICAL ANESTHETIC: none  DESCRIPTION OF PROCEDURE: After the risks benefits and alternatives of the procedure were thoroughly explained, informed consent was obtained.  The LB [id number] endoscope was introduced through the mouth and advanced to the second portion of the duodenum , Without limitations.  The instrument was slowly withdrawn as the mucosa was fully examined.  There was a thin mucosal ring (Schatzki's) at the GE junction.  This created a very minor shelf and it was dilated with TTS 36mm balloon held inflated for 1 minute.  There was a large amount of solid food in the stomach and underlying mild pan-gastritis.  The stomach was biopsied (antrum and body) and sent to pathology.  The examination was otherwise normal.  Retroflexed views revealed no abnormalities. The scope was then withdrawn from the patient and the procedure completed.  COMPLICATIONS: There were no immediate complications.  ENDOSCOPIC IMPRESSION: There was a thin mucosal ring (Schatzki's) at the GE junction.  This created a very minor shelf and it was dilated with TTS 51mm balloon held inflated for 1 minute.  There was a large amount of solid food in the stomach and underlying mild pan-gastritis.  The stomach was biopsied (antrum and body) and sent to pathology.  The examination was otherwise normal  RECOMMENDATIONS: If biopsies show H.  pylori you will be started on appropriate antibiotics.  Please continue the pantoprazole  before breakfast and dinner meals (as discussed in the office yesterday).  Limit narcotic pain medicines as these can slow stomach function. Try eating smaller, more frequent meals.   eSigned:  Milus Banister, MD 05/05/2015 8:38 AM

## 2015-05-05 NOTE — Patient Instructions (Signed)
YOU HAD AN ENDOSCOPIC PROCEDURE TODAY AT [facility]:   Refer to the procedure report that was given to you for any specific questions about what was found during the examination.  If the procedure report does not answer your questions, please call your gastroenterologist to clarify.  If you requested that your care partner not be given the details of your procedure findings, then the procedure report has been included in a sealed envelope for you to review at your convenience later.  YOU SHOULD EXPECT: Some feelings of bloating in the abdomen. Passage of more gas than usual.  Walking can help get rid of the air that was put into your GI tract during the procedure and reduce the bloating. If you had a lower endoscopy (such as a colonoscopy or flexible sigmoidoscopy) you may notice spotting of blood in your stool or on the toilet paper. If you underwent a bowel prep for your procedure, you may not have a normal bowel movement for a few days.  Please Note:  You might notice some irritation and congestion in your nose or some drainage.  This is from the oxygen used during your procedure.  There is no need for concern and it should clear up in a day or so.  SYMPTOMS TO REPORT IMMEDIATELY:   Following upper endoscopy (EGD)  Vomiting of blood or coffee ground material  New chest pain or pain under the shoulder blades  Painful or persistently difficult swallowing  New shortness of breath  Fever of 100F or higher  Black, tarry-looking stools  For urgent or emergent issues, a gastroenterologist can be reached at any hour by calling [phone].   DIET: Your first meal following the procedure should be a small meal and then it is ok to progress to your normal diet. Heavy or fried foods are harder to digest and may make you feel nauseous or bloated.  Likewise, meals heavy in dairy and vegetables can increase bloating.  Drink plenty of fluids but you should avoid alcoholic beverages for  24 hours.  ACTIVITY:  You should plan to take it easy for the rest of today and you should NOT DRIVE or use heavy machinery until tomorrow (because of the sedation medicines used during the test).    FOLLOW UP: Our staff will call the number listed on your records the next business day following your procedure to check on you and address any questions or concerns that you may have regarding the information given to you following your procedure. If we do not reach you, we will leave a message.  However, if you are feeling well and you are not experiencing any problems, there is no need to return our call.  We will assume that you have returned to your regular daily activities without incident.  If any biopsies were taken you will be contacted by phone or by letter within the next 1-3 weeks.  Please call us at [phone] if you have not heard about the biopsies in 3 weeks.    SIGNATURES/CONFIDENTIALITY: You and/or your care partner have signed paperwork which will be entered into your electronic medical record.  These signatures attest to the fact that that the information above on your After Visit Summary has been reviewed and is understood.  Full responsibility of the confidentiality of this discharge information lies with you and/or your care-partner.  Dilation diet provided, please review handout. Review gastritis and stricture handouts provided. Eat smaller more frequent meals.

## 2015-05-05 NOTE — Progress Notes (Signed)
Called to room to assist during endoscopic procedure.  Patient ID and intended procedure confirmed with present staff. Received instructions for my participation in the procedure from the performing physician.  

## 2015-05-05 NOTE — Progress Notes (Signed)
To recovery, report to [place], RN, VSS. 

## 2015-05-06 ENCOUNTER — Telehealth: Payer: Self-pay

## 2015-05-06 ENCOUNTER — Telehealth: Payer: Self-pay | Admitting: Gastroenterology

## 2015-05-06 NOTE — Telephone Encounter (Signed)
Pt notified that the results will not be available for at least a week.  She was told that we will call or send a letter when results reviewed.  Pt agreed

## 2015-05-06 NOTE — Telephone Encounter (Signed)
  Follow up Call-  Call back number 05/05/2015  Post procedure Call Back phone  # [phone]  Permission to leave phone message Yes     Patient questions:  Do you have a fever, pain , or abdominal swelling? No. Pain Score  0 *  Have you tolerated food without any problems? Yes.    Have you been able to return to your normal activities? Yes.    Do you have any questions about your discharge instructions: Diet   No. Medications  No. Follow up visit  No.  Do you have questions or concerns about your Care? No.  Actions: * If pain score is 4 or above: No action needed, pain <4.

## 2015-05-10 ENCOUNTER — Telehealth: Payer: Self-pay

## 2015-05-10 MED ORDER — ALPRAZOLAM 0.25 MG PO TABS: ORAL_TABLET | ORAL | Status: DC

## 2015-05-10 NOTE — Addendum Note (Signed)
Addended by: Mauricio Po D on: 05/10/2015 05:35 PM   Modules accepted: Orders

## 2015-05-10 NOTE — Telephone Encounter (Signed)
Medication printed to be faxed.  

## 2015-05-10 NOTE — Telephone Encounter (Signed)
Please advise Dr. Jenny Reichmann is out of the office and patient is needing a refill of her xanax

## 2015-05-11 NOTE — Telephone Encounter (Signed)
Rx faxed

## 2015-05-18 ENCOUNTER — Other Ambulatory Visit: Payer: Self-pay

## 2015-05-18 MED ORDER — METOCLOPRAMIDE HCL 10 MG PO TABS: 10.0000 mg | ORAL_TABLET | Freq: Every day | ORAL | Status: DC

## 2015-06-22 ENCOUNTER — Encounter: Payer: Commercial Managed Care - HMO | Admitting: Internal Medicine

## 2015-07-08 ENCOUNTER — Telehealth: Payer: Self-pay

## 2015-07-08 ENCOUNTER — Other Ambulatory Visit: Payer: Self-pay

## 2015-07-08 MED ORDER — LOSARTAN POTASSIUM-HCTZ 50-12.5 MG PO TABS: 1.0000 | ORAL_TABLET | Freq: Every day | ORAL | Status: DC

## 2015-07-08 MED ORDER — ALPRAZOLAM 0.25 MG PO TABS: ORAL_TABLET | ORAL | Status: DC

## 2015-07-08 NOTE — Telephone Encounter (Signed)
Please advise, patient is requesting refill on xanax

## 2015-07-08 NOTE — Telephone Encounter (Signed)
Done hardcopy to Corinne  

## 2015-07-08 NOTE — Telephone Encounter (Signed)
Medication faxed to pharmacy 

## 2015-07-08 NOTE — Addendum Note (Signed)
Addended by: Biagio Borg on: 07/08/2015 12:46 PM   Modules accepted: Orders

## 2015-07-13 ENCOUNTER — Other Ambulatory Visit: Payer: Self-pay

## 2015-07-13 MED ORDER — NAPROXEN 500 MG PO TABS: ORAL_TABLET | ORAL | Status: DC

## 2015-07-14 ENCOUNTER — Other Ambulatory Visit: Payer: Self-pay

## 2015-07-14 MED ORDER — METOCLOPRAMIDE HCL 10 MG PO TABS: 10.0000 mg | ORAL_TABLET | Freq: Every day | ORAL | Status: DC

## 2015-07-15 ENCOUNTER — Telehealth: Payer: Self-pay | Admitting: *Deleted

## 2015-07-15 MED ORDER — DEXLANSOPRAZOLE 60 MG PO CPDR: 60.0000 mg | DELAYED_RELEASE_CAPSULE | Freq: Every day | ORAL | Status: DC

## 2015-07-15 MED ORDER — NAPROXEN 500 MG PO TABS: ORAL_TABLET | ORAL | Status: DC

## 2015-07-15 MED ORDER — DULOXETINE HCL 60 MG PO CPEP: 60.0000 mg | ORAL_CAPSULE | Freq: Every day | ORAL | Status: DC

## 2015-07-15 MED ORDER — LOSARTAN POTASSIUM-HCTZ 50-12.5 MG PO TABS: 1.0000 | ORAL_TABLET | Freq: Every day | ORAL | Status: DC

## 2015-07-15 MED ORDER — ZOLPIDEM TARTRATE 10 MG PO TABS: ORAL_TABLET | ORAL | Status: DC

## 2015-07-15 MED ORDER — CYCLOBENZAPRINE HCL 5 MG PO TABS: ORAL_TABLET | ORAL | Status: DC

## 2015-07-15 MED ORDER — ALPRAZOLAM 0.25 MG PO TABS: ORAL_TABLET | ORAL | Status: DC

## 2015-07-15 NOTE — Telephone Encounter (Signed)
Faxed script to [facility].../lmb 

## 2015-07-15 NOTE — Telephone Encounter (Signed)
Left msg on triage stating we are trying to get new prescriptions on pt Atorvastatin, cyclobenzaprine, Diclofenac, cymbalta, losartan, naproxen, pantoprazole, ambien and tramadol. Sent maintenance meds pls advise on controls...[name]

## 2015-07-15 NOTE — Telephone Encounter (Signed)
Medications given to Pamela [name]

## 2015-07-15 NOTE — Telephone Encounter (Signed)
Xanax and tramadol done hardcopy to [facility] for other meds with 90 days no refills only  Pt to make ROV before further reiflls

## 2015-07-16 ENCOUNTER — Other Ambulatory Visit: Payer: Self-pay | Admitting: Internal Medicine

## 2015-07-20 ENCOUNTER — Ambulatory Visit: Payer: Commercial Managed Care - HMO | Admitting: Gastroenterology

## 2015-07-20 ENCOUNTER — Other Ambulatory Visit: Payer: Self-pay | Admitting: Internal Medicine

## 2015-07-20 NOTE — Telephone Encounter (Signed)
Medication faxed to pharmacy 

## 2015-07-20 NOTE — Telephone Encounter (Signed)
Done hardcopy to Corinne  

## 2015-07-20 NOTE — Telephone Encounter (Signed)
Please advise 

## 2015-07-28 ENCOUNTER — Ambulatory Visit: Payer: Commercial Managed Care - HMO | Admitting: Internal Medicine

## 2015-08-03 ENCOUNTER — Telehealth: Payer: Self-pay | Admitting: *Deleted

## 2015-08-03 NOTE — Telephone Encounter (Signed)
Receive fax from [facility] requesting refill on pt Tramadol 50 mg. Per chart 5/3 MD ok and rx was faxed back to pharmacy. Called pharmacy spoke w/Nick pharmacist  to verify if rx was received on 5/9 they did not received gave verbal authorization to fill from 5/9...[name]

## 2015-08-06 ENCOUNTER — Ambulatory Visit: Payer: Commercial Managed Care - HMO | Admitting: Internal Medicine

## 2015-08-17 DIAGNOSIS — H5231 Anisometropia: Secondary | ICD-10-CM | POA: Diagnosis not present

## 2015-08-17 DIAGNOSIS — H52203 Unspecified astigmatism, bilateral: Secondary | ICD-10-CM | POA: Diagnosis not present

## 2015-08-17 DIAGNOSIS — H5213 Myopia, bilateral: Secondary | ICD-10-CM | POA: Diagnosis not present

## 2015-08-20 ENCOUNTER — Other Ambulatory Visit: Payer: Self-pay

## 2015-08-20 MED ORDER — BUTALBITAL-APAP-CAFFEINE 50-325-40 MG PO TABS: 1.0000 | ORAL_TABLET | Freq: Two times a day (BID) | ORAL | Status: DC

## 2015-08-20 NOTE — Telephone Encounter (Signed)
Done hardcopy to staff 

## 2015-08-20 NOTE — Telephone Encounter (Signed)
Incoming fax, ok tofill?

## 2015-08-23 ENCOUNTER — Telehealth: Payer: Self-pay | Admitting: Emergency Medicine

## 2015-08-23 NOTE — Telephone Encounter (Signed)
Spoke with pt to inform RX was ready for pick-up at the front office

## 2015-08-23 NOTE — Telephone Encounter (Signed)
Redcieve call pt requesting status on refill. Could not locate rx call [facility]ill [facility] spoke w/Nana gave md response...Pamela [name]

## 2015-08-25 ENCOUNTER — Other Ambulatory Visit: Payer: Self-pay | Admitting: Internal Medicine

## 2015-08-25 ENCOUNTER — Ambulatory Visit (INDEPENDENT_AMBULATORY_CARE_PROVIDER_SITE_OTHER): Payer: Commercial Managed Care - HMO | Admitting: Internal Medicine

## 2015-08-25 ENCOUNTER — Other Ambulatory Visit (INDEPENDENT_AMBULATORY_CARE_PROVIDER_SITE_OTHER): Payer: Commercial Managed Care - HMO

## 2015-08-25 ENCOUNTER — Encounter: Payer: Self-pay | Admitting: Internal Medicine

## 2015-08-25 VITALS — BP 130/82 | HR 84 | Temp 98.7°F | Wt 212.0 lb

## 2015-08-25 DIAGNOSIS — R634 Abnormal weight loss: Secondary | ICD-10-CM | POA: Diagnosis not present

## 2015-08-25 DIAGNOSIS — R8290 Unspecified abnormal findings in urine: Secondary | ICD-10-CM

## 2015-08-25 DIAGNOSIS — K219 Gastro-esophageal reflux disease without esophagitis: Secondary | ICD-10-CM

## 2015-08-25 DIAGNOSIS — I10 Essential (primary) hypertension: Secondary | ICD-10-CM | POA: Diagnosis not present

## 2015-08-25 LAB — URINALYSIS, ROUTINE W REFLEX MICROSCOPIC
BILIRUBIN URINE: NEGATIVE
Ketones, ur: NEGATIVE
NITRITE: POSITIVE — AB
Specific Gravity, Urine: 1.01 (ref 1.000–1.030)
TOTAL PROTEIN, URINE-UPE24: NEGATIVE
Urine Glucose: NEGATIVE
Urobilinogen, UA: 0.2 (ref 0.0–1.0)
pH: 6.5 (ref 5.0–8.0)

## 2015-08-25 LAB — BASIC METABOLIC PANEL
BUN: 10 mg/dL (ref 6–23)
CHLORIDE: 104 meq/L (ref 96–112)
CO2: 31 mEq/L (ref 19–32)
Calcium: 9.6 mg/dL (ref 8.4–10.5)
Creatinine, Ser: 0.82 mg/dL (ref 0.40–1.20)
GFR: 91.14 mL/min (ref >60.00)
Glucose, Bld: 111 mg/dL — ABNORMAL HIGH (ref 70–99)
POTASSIUM: 3.5 meq/L (ref 3.5–5.1)
Sodium: 142 mEq/L (ref 135–145)

## 2015-08-25 LAB — HEPATIC FUNCTION PANEL
ALK PHOS: 48 U/L (ref 39–117)
ALT: 15 U/L (ref 0–35)
AST: 15 U/L (ref 0–37)
Albumin: 4 g/dL (ref 3.5–5.2)
BILIRUBIN DIRECT: 0 mg/dL (ref 0.0–0.3)
BILIRUBIN TOTAL: 0.4 mg/dL (ref 0.2–1.2)
Total Protein: 7.4 g/dL (ref 6.0–8.3)

## 2015-08-25 LAB — TSH: TSH: 1.11 u[IU]/mL (ref 0.35–4.50)

## 2015-08-25 MED ORDER — DEXLANSOPRAZOLE 60 MG PO CPDR: 60.0000 mg | DELAYED_RELEASE_CAPSULE | Freq: Every day | ORAL | Status: DC

## 2015-08-25 MED ORDER — CEFUROXIME AXETIL 250 MG PO TABS: 250.0000 mg | ORAL_TABLET | Freq: Two times a day (BID) | ORAL | Status: DC

## 2015-08-25 NOTE — Progress Notes (Signed)
Subjective:  Patient ID: Pamela [name], female    DOB: 1955/04/06  Age: 60 y.o. MRN: [id number]  CC: No chief complaint on file.   HPI Pamela [name] presents for a urine odor w/o other sx's C/o poor appetite. Pt saw Dr Ardis Hughs for EGD/colon C/o GERD in am  Outpatient Prescriptions Prior to Visit  Medication Sig Dispense Refill  . ALPRAZolam (XANAX) 0.25 MG tablet take 1 tablet by mouth twice a day if needed 60 tablet 2  . atorvastatin (LIPITOR) 20 MG tablet TAKE 1 TABLET (20 MG TOTAL) BY MOUTH DAILY. 90 tablet 2  . butalbital-acetaminophen-caffeine (FIORICET, ESGIC) 50-325-40 MG tablet Take 1 tablet by mouth 2 (two) times daily. 60 tablet 1  . cyclobenzaprine (FLEXERIL) 5 MG tablet take 1 tablet by mouth three times a day if needed 180 tablet 0  . diclofenac sodium (VOLTAREN) 1 % GEL APPLY TO AFFECTED AREAS UPTO 3 TIMES A DAY AS NEEDED 100 g 1  . DULoxetine (CYMBALTA) 60 MG capsule Take 1 capsule (60 mg total) by mouth daily. Yearly physical w/labs is due must see MD for refills 90 capsule 0  . fluocinonide (LIDEX) 0.05 % external solution Apply topically 2 (two) times daily. 60 mL 0  . irbesartan-hydrochlorothiazide (AVALIDE) 150-12.5 MG per tablet Take 1 tablet by mouth daily. 90 tablet 1  . losartan-hydrochlorothiazide (HYZAAR) 50-12.5 MG tablet Take 1 tablet by mouth daily. Yearly physical w/labs is due must see md for refills 90 tablet 0  . metoCLOPramide (REGLAN) 10 MG tablet Take 1 tablet (10 mg total) by mouth at bedtime. 30 tablet 3  . naproxen (NAPROSYN) 500 MG tablet take 1 tablet by mouth twice a day with food if needed for pain Yearly physical w/labs are due must see MD for refills 90 tablet 0  . traMADol (ULTRAM) 50 MG tablet TAKE 1 TABLET BY MOUTH EVERY 6 HOURS 120 tablet 1  . zolpidem (AMBIEN) 10 MG tablet take 1 tablet by mouth at bedtime if needed for sleep 30 tablet 2  . dexlansoprazole (DEXILANT) 60 MG capsule Take 1 capsule (60 mg total) by mouth daily. Yearly  physical w/labs are due must see MD for refills 90 capsule 0  . HYDROcodone-acetaminophen (NORCO/VICODIN) 5-325 MG per tablet Take 1 tablet by mouth every 6 (six) hours as needed. 15 tablet 0  . sulfamethoxazole-trimethoprim (BACTRIM DS,SEPTRA DS) 800-160 MG tablet Take 1 tablet by mouth 2 (two) times daily. 20 tablet 0   No facility-administered medications prior to visit.    ROS Review of Systems  Constitutional: Positive for fatigue and unexpected weight change. Negative for chills, activity change and appetite change.  HENT: Negative for congestion, mouth sores and sinus pressure.   Eyes: Negative for visual disturbance.  Respiratory: Negative for cough and chest tightness.   Gastrointestinal: Negative for nausea and abdominal pain.  Genitourinary: Negative for frequency, difficulty urinating and vaginal pain.  Musculoskeletal: Positive for arthralgias. Negative for back pain and gait problem.  Skin: Negative for pallor and rash.  Neurological: Negative for dizziness, tremors, weakness, numbness and headaches.  Psychiatric/Behavioral: Negative for confusion and sleep disturbance.    Objective:  BP 130/82 mmHg  Pulse 84  Temp(Src) 98.7 F (37.1 C) (Oral)  Wt 212 lb (96.163 kg)  SpO2 96%  BP Readings from Last 3 Encounters:  08/25/15 130/82  05/05/15 136/86  05/04/15 140/90    Wt Readings from Last 3 Encounters:  08/25/15 212 lb (96.163 kg)  05/05/15 219 lb (99.338 kg)  05/04/15 219 lb 12.8 oz (99.701 kg)    Physical Exam  Constitutional: She appears well-developed. No distress.  HENT:  Head: Normocephalic.  Right Ear: External ear normal.  Left Ear: External ear normal.  Nose: Nose normal.  Mouth/Throat: Oropharynx is clear and moist.  Eyes: Conjunctivae are normal. Pupils are equal, round, and reactive to light. Right eye exhibits no discharge. Left eye exhibits no discharge.  Neck: Normal range of motion. Neck supple. No JVD present. No tracheal deviation  present. No thyromegaly present.  Cardiovascular: Normal rate, regular rhythm and normal heart sounds.   Pulmonary/Chest: No stridor. No respiratory distress. She has no wheezes.  Abdominal: Soft. Bowel sounds are normal. She exhibits no distension and no mass. There is no tenderness. There is no rebound and no guarding.  Musculoskeletal: She exhibits no edema or tenderness.  Lymphadenopathy:    She has no cervical adenopathy.  Neurological: She displays normal reflexes. No cranial nerve deficit. She exhibits normal muscle tone. Coordination normal.  Skin: No rash noted. No erythema.  Psychiatric: She has a normal mood and affect. Her behavior is normal. Judgment and thought content normal.  abd S/NT  Lab Results  Component Value Date   WBC 7.6 06/22/2014   HGB 12.5 06/22/2014   HCT 37.2 06/22/2014   PLT 239.0 06/22/2014   GLUCOSE 111* 06/22/2014   CHOL 224* 06/22/2014   TRIG 89.0 06/22/2014   HDL 61.00 06/22/2014   LDLDIRECT 109.9 04/03/2012   LDLCALC 145* 06/22/2014   ALT 16 06/22/2014   AST 14 06/22/2014   NA 138 06/22/2014   K 3.8 06/22/2014   CL 101 06/22/2014   CREATININE 0.86 06/22/2014   BUN 15 06/22/2014   CO2 31 06/22/2014   TSH 1.58 06/22/2014   INR 1.0 06/29/2008    No results found.  Assessment & Plan:   Diagnoses and all orders for this visit:  Essential hypertension -     Urinalysis; Future -     TSH; Future -     Basic metabolic panel; Future -     Hepatic function panel; Future  Loss of weight -     Urinalysis; Future -     TSH; Future -     Basic metabolic panel; Future -     Hepatic function panel; Future  Abnormal urine odor -     Urinalysis; Future -     TSH; Future -     Basic metabolic panel; Future -     Hepatic function panel; Future  Gastroesophageal reflux disease without esophagitis -     Urinalysis; Future -     TSH; Future -     Basic metabolic panel; Future -     Hepatic function panel; Future  Other orders -      dexlansoprazole (DEXILANT) 60 MG capsule; Take 1 capsule (60 mg total) by mouth daily. Yearly physical w/labs are due must see MD for refills  I have discontinued [name]'s HYDROcodone-acetaminophen and sulfamethoxazole-trimethoprim. I am also having her maintain her fluocinonide, irbesartan-hydrochlorothiazide, diclofenac sodium, metoCLOPramide, naproxen, losartan-hydrochlorothiazide, DULoxetine, cyclobenzaprine, zolpidem, ALPRAZolam, atorvastatin, traMADol, butalbital-acetaminophen-caffeine, and dexlansoprazole.  Meds ordered this encounter  Medications  . dexlansoprazole (DEXILANT) 60 MG capsule    Sig: Take 1 capsule (60 mg total) by mouth daily. Yearly physical w/labs are due must see MD for refills    Dispense:  90 capsule    Refill:  1     Follow-up: Return in about 6 weeks (around 10/06/2015), or Dr Jenny Reichmann,  for a follow-up visit.  Walker Kehr, MD

## 2015-08-25 NOTE — Patient Instructions (Addendum)
GERD wedge pillow asparagus may cause urine odor

## 2015-08-25 NOTE — Assessment & Plan Note (Signed)
BP Readings from Last 3 Encounters:  08/25/15 130/82  05/05/15 136/86  05/04/15 140/90

## 2015-08-25 NOTE — Assessment & Plan Note (Signed)
?  meds related UA, labs asparagus may cause urine odor

## 2015-08-25 NOTE — Assessment & Plan Note (Signed)
Pt saw Dr Ardis Hughs for EGD/colon Worse - re-start Dexilant

## 2015-08-25 NOTE — Progress Notes (Signed)
Pre visit review using our clinic review tool, if applicable. No additional management support is needed unless otherwise documented below in the visit note. 

## 2015-08-25 NOTE — Assessment & Plan Note (Addendum)
Mild ?meds related Labs  Pt saw Dr Ardis Hughs for EGD/colon

## 2015-09-21 ENCOUNTER — Ambulatory Visit (INDEPENDENT_AMBULATORY_CARE_PROVIDER_SITE_OTHER): Payer: Commercial Managed Care - HMO | Admitting: Gastroenterology

## 2015-09-21 ENCOUNTER — Encounter: Payer: Self-pay | Admitting: Gastroenterology

## 2015-09-21 VITALS — BP 110/80 | HR 82 | Ht 69.0 in | Wt 206.0 lb

## 2015-09-21 DIAGNOSIS — R634 Abnormal weight loss: Secondary | ICD-10-CM | POA: Diagnosis not present

## 2015-09-21 DIAGNOSIS — Z1211 Encounter for screening for malignant neoplasm of colon: Secondary | ICD-10-CM

## 2015-09-21 DIAGNOSIS — R6881 Early satiety: Secondary | ICD-10-CM | POA: Diagnosis not present

## 2015-09-21 MED ORDER — NA SULFATE-K SULFATE-MG SULF 17.5-3.13-1.6 GM/177ML PO SOLN: 1.0000 | Freq: Once | ORAL | Status: DC

## 2015-09-21 NOTE — Progress Notes (Signed)
Review of pertinent gastrointestinal problems: 1. Routine risk for colon cancer: Routine screening colonoscopy 02/2007 Dr. Ardis Hughs found single small HP polyp, was recommended to have repeat screening in 10 years. 2. Dysphagia; EGD 04/2015 Dr. Ardis Hughs found thin Schatzki's ring (dilated to 48mm), also retained food in stomach, gastritis (H. Pylori neg by biopsy).   3. 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  HPI: This is a    very pleasant 60 year old woman whom I last saw about 5 months ago  Chief complaint is persistent early satiety, continued weight loss  She's lost 13 pounds since last visit 5 months ago.  Swallowing is easier.  No dysphagia. \ Still has upset feeling in stomach.  Mild nausea.    She eats about once per day.  Still has early satiety sensation.    Has BM about every 2-3 days.    This has correlated with early satiety.  Needs pain med 4-5 times (tramadol) per week, only one pill at a time.  No overt bleeding.   Past Medical History  Diagnosis Date  . Hypertension   . HYPERLIPIDEMIA 12/18/2006    Qualifier: History of  By: Danny Lawless CMA, [place]    . DEPRESSION, SITUATIONAL 03/15/2007    Qualifier: Diagnosis of  By: Wynona Luna   . HYPERTENSION 12/18/2006    Qualifier: Diagnosis of  By: Danny Lawless CMA, [place]    . ALLERGIC RHINITIS 12/18/2006    Qualifier: Diagnosis of  By: [place], [place]    . INSOMNIA-SLEEP DISORDER-UNSPEC 08/25/2009    Qualifier: Diagnosis of  By: Jenny Reichmann MD, [place] Oris   . RENAL CALCULUS, HX OF 12/18/2006    Qualifier: Diagnosis of  By: [place], [place]      Past Surgical History  Procedure Laterality Date  . Cervical disc surgery      x 3  . Knee arthroscopy      partial    Current Outpatient Prescriptions  Medication Sig Dispense Refill  . ALPRAZolam (XANAX) 0.25 MG tablet take 1 tablet by  mouth twice a day if needed 60 tablet 2  . atorvastatin (LIPITOR) 20 MG tablet TAKE 1 TABLET (20 MG TOTAL) BY MOUTH DAILY. 90 tablet 2  . butalbital-acetaminophen-caffeine (FIORICET, ESGIC) 50-325-40 MG tablet Take 1 tablet by mouth 2 (two) times daily. 60 tablet 1  . cyclobenzaprine (FLEXERIL) 5 MG tablet take 1 tablet by mouth three times a day if needed 180 tablet 0  . dexlansoprazole (DEXILANT) 60 MG capsule Take 1 capsule (60 mg total) by mouth daily. Yearly physical w/labs are due must see MD for refills 90 capsule 1  . diclofenac sodium (VOLTAREN) 1 % GEL APPLY TO AFFECTED AREAS UPTO 3 TIMES A DAY AS NEEDED 100 g 1  . DULoxetine (CYMBALTA) 60 MG capsule Take 1 capsule (60 mg total) by mouth daily. Yearly physical w/labs is due must see MD for refills 90 capsule 0  . fluocinonide (LIDEX) 0.05 % external solution Apply topically 2 (two) times daily. 60 mL 0  . irbesartan-hydrochlorothiazide (AVALIDE) 150-12.5 MG per tablet Take 1 tablet by mouth daily. 90 tablet 1  . losartan-hydrochlorothiazide (HYZAAR) 50-12.5 MG tablet Take 1 tablet by mouth daily. Yearly physical w/labs is due must see md for refills 90 tablet 0  . metoCLOPramide (REGLAN) 10 MG tablet Take 1 tablet (10 mg total) by mouth at bedtime. 30 tablet 3  . naproxen (  NAPROSYN) 500 MG tablet take 1 tablet by mouth twice a day with food if needed for pain Yearly physical w/labs are due must see MD for refills 90 tablet 0  . traMADol (ULTRAM) 50 MG tablet TAKE 1 TABLET BY MOUTH EVERY 6 HOURS 120 tablet 1  . zolpidem (AMBIEN) 10 MG tablet take 1 tablet by mouth at bedtime if needed for sleep 30 tablet 2   No current facility-administered medications for this visit.    Allergies as of 09/21/2015  . (No Known Allergies)    Family History  Problem Relation Age of Onset  . Stroke Mother   . Heart disease Father   . Lung cancer Sister   . Lung cancer Brother     Social History   Social History  . Marital Status: Married     Spouse Name: N/A  . Number of Children: 3  . Years of Education: N/A   Occupational History  . Not on file.   Social History Main Topics  . Smoking status: Former Research scientist (life sciences)  . Smokeless tobacco: Never Used  . Alcohol Use: No     Comment: occasionally  . Drug Use: No  . Sexual Activity: Not on file   Other Topics Concern  . Not on file   Social History Narrative     Physical Exam: BP 110/80 mmHg  Pulse 82  Ht 5\' 9"  (1.753 m)  Wt 206 lb (93.441 kg)  BMI 30.41 kg/m2 Constitutional: generally well-appearing Psychiatric: alert and oriented x3 Abdomen: soft, nontender, nondistended, no obvious ascites, no peritoneal signs, normal bowel sounds   Assessment and plan: 60 y.o. female with Persistent early satiety, continued weight loss  Unclear etiology but she is continuing to lose weight and I'm concerned about occult malignancy. I recommended we proceed with CT scan abdomen and pelvis for her early satiety as well as a colonoscopy. Her last screening examination was almost 10 years ago in 2008.   Owens Loffler, MD [facility] 09/21/2015, 2:03 PM

## 2015-09-21 NOTE — Patient Instructions (Addendum)
You will be set up for a CT scan of abdomen and pelvis with IV and oral contrast (early satiety weight loss).  You have been scheduled for a CT scan of the abdomen and pelvis at [facility] ([address]---this is in the same building as Press [facility]).   You are scheduled on 09/22/15 at 11:30 am. You should arrive 15 minutes prior to your appointment time for registration. Please follow the written instructions below on the day of your exam:  WARNING: IF YOU ARE ALLERGIC TO IODINE/X-RAY DYE, PLEASE NOTIFY RADIOLOGY IMMEDIATELY AT [phone]! YOU WILL BE GIVEN A 13 HOUR PREMEDICATION PREP.  1) Do not eat or drink anything after 730 am (4 hours prior to your test) 2) You have been given 2 bottles of oral contrast to drink. The solution may taste better if refrigerated, but do NOT add ice or any other liquid to this solution. Shake well before drinking.    Drink 1 bottle of contrast @ 930 am (2 hours prior to your exam)  Drink 1 bottle of contrast @ 1030 am (1 hour prior to your exam)  You may take any medications as prescribed with a small amount of water except for the following: Metformin, Glucophage, Glucovance, Avandamet, Riomet, Fortamet, Actoplus Met, Janumet, Glumetza or Metaglip. The above medications must be held the day of the exam AND 48 hours after the exam.  The purpose of you drinking the oral contrast is to aid in the visualization of your intestinal tract. The contrast solution may cause some diarrhea. Before your exam is started, you will be given a small amount of fluid to drink. Depending on your individual set of symptoms, you may also receive an intravenous injection of x-ray contrast/dye. Plan on being at [facility] for 30 minutes or longer, depending on the type of exam you are having performed.  This test typically takes 30-45 minutes to complete.  If you have any questions regarding your exam or if you need to reschedule, you may call the CT  department at [phone] between the hours of 8:00 am and 5:00 pm, Monday-Friday.  ________________________________________________________________________  Dennis Bast will be set up for a colonoscopy (for weight loss, screening).  Contact your PCP for referral for procedure, your current referral expires on 10/31/15  Continue eating smaller, more frequent meals (4-5 small meals per day).

## 2015-09-22 ENCOUNTER — Ambulatory Visit (INDEPENDENT_AMBULATORY_CARE_PROVIDER_SITE_OTHER)
Admission: RE | Admit: 2015-09-22 | Discharge: 2015-09-22 | Disposition: A | Discharge status: Home / Self Care | Payer: Commercial Managed Care - HMO | Source: Ambulatory Visit | Attending: Gastroenterology | Admitting: Gastroenterology

## 2015-09-22 ENCOUNTER — Telehealth: Payer: Self-pay | Admitting: Gastroenterology

## 2015-09-22 DIAGNOSIS — R6881 Early satiety: Secondary | ICD-10-CM

## 2015-09-22 DIAGNOSIS — R634 Abnormal weight loss: Secondary | ICD-10-CM | POA: Diagnosis not present

## 2015-09-22 DIAGNOSIS — K5790 Diverticulosis of intestine, part unspecified, without perforation or abscess without bleeding: Secondary | ICD-10-CM | POA: Diagnosis not present

## 2015-09-22 DIAGNOSIS — Z1211 Encounter for screening for malignant neoplasm of colon: Secondary | ICD-10-CM | POA: Diagnosis not present

## 2015-09-22 MED ORDER — IOPAMIDOL (ISOVUE-300) INJECTION 61%
100.0000 mL | Freq: Once | INTRAVENOUS | Status: AC | PRN
  Administered 2015-09-22: 100 mL via INTRAVENOUS

## 2015-09-22 NOTE — Telephone Encounter (Signed)
Magda Paganini can you please help?

## 2015-09-23 ENCOUNTER — Ambulatory Visit (INDEPENDENT_AMBULATORY_CARE_PROVIDER_SITE_OTHER)
Admission: RE | Admit: 2015-09-23 | Discharge: 2015-09-23 | Disposition: A | Discharge status: Home / Self Care | Payer: Commercial Managed Care - HMO | Source: Ambulatory Visit | Attending: Internal Medicine | Admitting: Internal Medicine

## 2015-09-23 ENCOUNTER — Encounter: Payer: Self-pay | Admitting: Internal Medicine

## 2015-09-23 ENCOUNTER — Other Ambulatory Visit (INDEPENDENT_AMBULATORY_CARE_PROVIDER_SITE_OTHER): Payer: Commercial Managed Care - HMO

## 2015-09-23 ENCOUNTER — Encounter: Payer: Commercial Managed Care - HMO | Admitting: Internal Medicine

## 2015-09-23 ENCOUNTER — Ambulatory Visit (INDEPENDENT_AMBULATORY_CARE_PROVIDER_SITE_OTHER): Payer: Commercial Managed Care - HMO | Admitting: Internal Medicine

## 2015-09-23 VITALS — BP 130/70 | HR 88 | Temp 98.0°F | Resp 20 | Wt 207.0 lb

## 2015-09-23 DIAGNOSIS — Z23 Encounter for immunization: Secondary | ICD-10-CM | POA: Diagnosis not present

## 2015-09-23 DIAGNOSIS — I10 Essential (primary) hypertension: Secondary | ICD-10-CM | POA: Diagnosis not present

## 2015-09-23 DIAGNOSIS — Z1159 Encounter for screening for other viral diseases: Secondary | ICD-10-CM | POA: Diagnosis not present

## 2015-09-23 DIAGNOSIS — Z0000 Encounter for general adult medical examination without abnormal findings: Secondary | ICD-10-CM

## 2015-09-23 DIAGNOSIS — R634 Abnormal weight loss: Secondary | ICD-10-CM | POA: Diagnosis not present

## 2015-09-23 DIAGNOSIS — Z0189 Encounter for other specified special examinations: Secondary | ICD-10-CM

## 2015-09-23 DIAGNOSIS — R739 Hyperglycemia, unspecified: Secondary | ICD-10-CM | POA: Diagnosis not present

## 2015-09-23 DIAGNOSIS — F4321 Adjustment disorder with depressed mood: Secondary | ICD-10-CM

## 2015-09-23 DIAGNOSIS — F411 Generalized anxiety disorder: Secondary | ICD-10-CM

## 2015-09-23 LAB — BASIC METABOLIC PANEL
BUN: 13 mg/dL (ref 6–23)
CALCIUM: 9.9 mg/dL (ref 8.4–10.5)
CO2: 32 meq/L (ref 19–32)
CREATININE: 0.82 mg/dL (ref 0.40–1.20)
Chloride: 92 mEq/L — ABNORMAL LOW (ref 96–112)
GFR: 91.11 mL/min (ref >60.00)
Glucose, Bld: 117 mg/dL — ABNORMAL HIGH (ref 70–99)
Potassium: 3.8 mEq/L (ref 3.5–5.1)
Sodium: 149 mEq/L — ABNORMAL HIGH (ref 135–145)

## 2015-09-23 LAB — CBC WITH DIFFERENTIAL/PLATELET
BASOS ABS: 0.1 10*3/uL (ref 0.0–0.1)
BASOS PCT: 0.8 % (ref 0.0–3.0)
EOS ABS: 0.2 10*3/uL (ref 0.0–0.7)
Eosinophils Relative: 2 % (ref 0.0–5.0)
HEMATOCRIT: 37 % (ref 36.0–46.0)
Hemoglobin: 12.2 g/dL (ref 12.0–15.0)
LYMPHS ABS: 1.1 10*3/uL (ref 0.7–4.0)
LYMPHS PCT: 14.4 % (ref 12.0–46.0)
MCHC: 33 g/dL (ref 30.0–36.0)
MCV: 80.8 fl (ref 78.0–100.0)
MONO ABS: 0.6 10*3/uL (ref 0.1–1.0)
Monocytes Relative: 8 % (ref 3.0–12.0)
NEUTROS ABS: 5.8 10*3/uL (ref 1.4–7.7)
NEUTROS PCT: 74.8 % (ref 43.0–77.0)
PLATELETS: 255 10*3/uL (ref 150.0–400.0)
RBC: 4.57 Mil/uL (ref 3.87–5.11)
RDW: 12.1 % (ref 11.5–15.5)
WBC: 7.7 10*3/uL (ref 4.0–10.5)

## 2015-09-23 LAB — LIPID PANEL
CHOL/HDL RATIO: 3
Cholesterol: 187 mg/dL (ref 0–200)
HDL: 74 mg/dL (ref >39.00)
LDL Cholesterol: 97 mg/dL (ref 0–99)
NONHDL: 113.2
TRIGLYCERIDES: 79 mg/dL (ref 0.0–149.0)
VLDL: 15.8 mg/dL (ref 0.0–40.0)

## 2015-09-23 LAB — HEMOGLOBIN A1C: HEMOGLOBIN A1C: 5.4 % (ref 4.6–6.5)

## 2015-09-23 MED ORDER — DULOXETINE HCL 60 MG PO CPEP: 60.0000 mg | ORAL_CAPSULE | Freq: Every day | ORAL | Status: DC

## 2015-09-23 MED ORDER — ALPRAZOLAM 0.25 MG PO TABS: ORAL_TABLET | ORAL | Status: DC

## 2015-09-23 NOTE — Assessment & Plan Note (Signed)

## 2015-09-23 NOTE — Assessment & Plan Note (Signed)
stable overall by history and exam, recent data reviewed with pt, and pt to continue medical treatment as before,  to f/u any worsening symptoms or concerns BP Readings from Last 3 Encounters:  09/23/15 130/70  09/21/15 110/80  08/25/15 130/82

## 2015-09-23 NOTE — Assessment & Plan Note (Signed)
Asympt, for a1c, stable overall by history and exam, recent data reviewed with pt, and pt to continue medical treatment as before,  to f/u any worsening symptoms or concerns Lab Results  Component Value Date   HGBA1C 5.4 09/23/2015

## 2015-09-23 NOTE — Progress Notes (Signed)
Subjective:    Patient ID: Pamela [name], female    DOB: 06-Mar-1956, 60 y.o.   MRN: [id number]  HPI  Here for wellness and f/u;  Overall doing ok;  Pt denies Chest pain, worsening SOB, DOE, wheezing, orthopnea, PND, worsening LE edema, palpitations, dizziness or syncope.  Pt denies neurological change such as new headache, facial or extremity weakness.  Pt denies polydipsia, polyuria, or low sugar symptoms. Pt states overall good compliance with treatment and medications, good tolerability, and has been trying to follow appropriate diet.   No fever, night sweats, wt loss, loss of appetite, or other constitutional symptoms.  Pt states good ability with ADL's, has low fall risk, home safety reviewed and adequate, no other significant changes in hearing or vision, and only occasionally active with exercise.    Has been peak wt 230 months ago , losing wt due to not eating, upper abd discomfort ? Has seen GI , for colonoscopy soon.  Has early satiety, o/w Denies worsening reflux, other abd pain, dysphagia, n/v, bowel change or blood. Wt Readings from Last 3 Encounters:  09/23/15 207 lb (93.895 kg)  09/21/15 206 lb (93.441 kg)  08/25/15 212 lb (96.163 kg)   BP Readings from Last 3 Encounters:  09/23/15 130/70  09/21/15 110/80  08/25/15 130/82  Recently tx for UTI per Dr Alain Marion, Denies urinary symptoms such as dysuria, frequency, urgency, flank pain, hematuria or n/v, fever, chills.. Denies worsening depressive symptoms, suicidal ideation, or panic; has ongoing anxiety, marked increased recently as she has run out of cymbalta and xanax x 1 mo.   Past Medical History  Diagnosis Date  . Hypertension   . HYPERLIPIDEMIA 12/18/2006    Qualifier: History of  By: Danny Lawless CMA, [place]    . DEPRESSION, SITUATIONAL 03/15/2007    Qualifier: Diagnosis of  By: Wynona Luna   . HYPERTENSION 12/18/2006    Qualifier: Diagnosis of  By: Danny Lawless CMA, [place]    . ALLERGIC RHINITIS 12/18/2006    Qualifier:  Diagnosis of  By: [place], [place]    . INSOMNIA-SLEEP DISORDER-UNSPEC 08/25/2009    Qualifier: Diagnosis of  By: Jenny Reichmann MD, [place] Oris   . RENAL CALCULUS, HX OF 12/18/2006    Qualifier: Diagnosis of  By: [place], [place]     Past Surgical History  Procedure Laterality Date  . Cervical disc surgery      x 3  . Knee arthroscopy      partial    reports that she has quit smoking. She has never used smokeless tobacco. She reports that she does not drink alcohol or use illicit drugs. family history includes Heart disease in her father; Lung cancer in her brother and sister; Stroke in her mother. No Known Allergies Current Outpatient Prescriptions on File Prior to Visit  Medication Sig Dispense Refill  . atorvastatin (LIPITOR) 20 MG tablet TAKE 1 TABLET (20 MG TOTAL) BY MOUTH DAILY. 90 tablet 2  . butalbital-acetaminophen-caffeine (FIORICET, ESGIC) 50-325-40 MG tablet Take 1 tablet by mouth 2 (two) times daily. 60 tablet 1  . cyclobenzaprine (FLEXERIL) 5 MG tablet take 1 tablet by mouth three times a day if needed 180 tablet 0  . dexlansoprazole (DEXILANT) 60 MG capsule Take 1 capsule (60 mg total) by mouth daily. Yearly physical w/labs are due must see MD for refills 90 capsule 1  . diclofenac sodium (VOLTAREN) 1 % GEL APPLY TO AFFECTED AREAS UPTO 3 TIMES A DAY AS NEEDED 100 g 1  .  fluocinonide (LIDEX) 0.05 % external solution Apply topically 2 (two) times daily. 60 mL 0  . irbesartan-hydrochlorothiazide (AVALIDE) 150-12.5 MG per tablet Take 1 tablet by mouth daily. 90 tablet 1  . losartan-hydrochlorothiazide (HYZAAR) 50-12.5 MG tablet Take 1 tablet by mouth daily. Yearly physical w/labs is due must see md for refills 90 tablet 0  . metoCLOPramide (REGLAN) 10 MG tablet Take 1 tablet (10 mg total) by mouth at bedtime. 30 tablet 3  . naproxen (NAPROSYN) 500 MG tablet take 1 tablet by mouth twice a day with food if needed for pain Yearly physical w/labs are due must see MD for refills 90 tablet 0    . traMADol (ULTRAM) 50 MG tablet TAKE 1 TABLET BY MOUTH EVERY 6 HOURS 120 tablet 1  . zolpidem (AMBIEN) 10 MG tablet take 1 tablet by mouth at bedtime if needed for sleep 30 tablet 2   No current facility-administered medications on file prior to visit.   Review of Systems Constitutional: Negative for increased diaphoresis, or other activity, appetite or siginficant weight change other than noted HENT: Negative for worsening hearing loss, ear pain, facial swelling, mouth sores and neck stiffness.   Eyes: Negative for other worsening pain, redness or visual disturbance.  Respiratory: Negative for choking or stridor Cardiovascular: Negative for other chest pain and palpitations.  Gastrointestinal: Negative for worsening diarrhea, blood in stool, or abdominal distention Genitourinary: Negative for hematuria, flank pain or change in urine volume.  Musculoskeletal: Negative for myalgias or other joint complaints.  Skin: Negative for other color change and wound or drainage.  Neurological: Negative for syncope and numbness. other than noted Hematological: Negative for adenopathy. or other swelling Psychiatric/Behavioral: Negative for hallucinations, SI, self-injury, decreased concentration or other worsening agitation.      Objective:   Physical Exam BP 130/70 mmHg  Pulse 88  Temp(Src) 98 F (36.7 C) (Oral)  Resp 20  Wt 207 lb (93.895 kg)  SpO2 98% VS noted,  Constitutional: Pt is oriented to person, place, and time. Appears well-developed and well-nourished, in no significant distress Head: Normocephalic and atraumatic  Eyes: Conjunctivae and EOM are normal. Pupils are equal, round, and reactive to light Right Ear: External ear normal.  Left Ear: External ear normal Nose: Nose normal.  Mouth/Throat: Oropharynx is clear and moist  Neck: Normal range of motion. Neck supple. No JVD present. No tracheal deviation present or significant neck LA or mass Cardiovascular: Normal rate,  regular rhythm, normal heart sounds and intact distal pulses.   Pulmonary/Chest: Effort normal and breath sounds without rales or wheezing  Abdominal: Soft. Bowel sounds are normal. NT. No HSM  Musculoskeletal: Normal range of motion. Exhibits no edema Lymphadenopathy: Has no cervical adenopathy.  Neurological: Pt is alert and oriented to person, place, and time. Pt has normal reflexes. No cranial nerve deficit. Motor grossly intact Skin: Skin is warm and dry. No rash noted or new ulcers Psychiatric:  Has 1-2+ nervous mood and affect. Behavior is normal. Not depressed affect  Lab Results  Component Value Date   WBC 7.6 06/22/2014   HGB 12.5 06/22/2014   HCT 37.2 06/22/2014   PLT 239.0 06/22/2014   GLUCOSE 111* 08/25/2015   CHOL 224* 06/22/2014   TRIG 89.0 06/22/2014   HDL 61.00 06/22/2014   LDLDIRECT 109.9 04/03/2012   LDLCALC 145* 06/22/2014   ALT 15 08/25/2015   AST 15 08/25/2015   NA 142 08/25/2015   K 3.5 08/25/2015   CL 104 08/25/2015   CREATININE 0.82 08/25/2015  BUN 10 08/25/2015   CO2 31 08/25/2015   TSH 1.11 08/25/2015   INR 1.0 06/29/2008    Recent CT September 22, 2015 IMPRESSION: 1. No acute findings in the abdomen or pelvis to account for the patient's symptoms. 2. No definite malignancy identified in the abdomen or pelvis. 3. Mild colonic diverticulosis without evidence of acute diverticulitis at this time. 4. 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 5. Aortic atherosclerosis (mild). 6. Additional incidental findings, as above.   Electronically Signed  By: Vinnie Langton M.D.  On: 09/22/2015 16:47     Assessment & Plan:

## 2015-09-23 NOTE — Progress Notes (Signed)
Pre visit review using our clinic review tool, if applicable. No additional management support is needed unless otherwise documented below in the visit note. 

## 2015-09-23 NOTE — Assessment & Plan Note (Signed)
No evidence recurrence, ok to follow, declines need for counseling

## 2015-09-23 NOTE — Patient Instructions (Addendum)
You had the Tdap Tetanus shot today  Please continue all other medications as before, and refills have been done if requested - including restarting the cymbalta, and the xanax refill  Please have the pharmacy call with any other refills you may need.  Please continue your efforts at being more active, low cholesterol diet, and weight control.  You are otherwise up to date with prevention measures today.  Please keep your appointments with your specialists as you may have planned  Please go to the XRAY Department in the Basement (go straight as you get off the elevator) for the x-ray testing  Please go to the LAB in the Basement (turn left off the elevator) for the tests to be done today  You will be contacted by phone if any changes need to be made immediately.  Otherwise, you will receive a letter about your results with an explanation, but please check with MyChart first.  Please remember to sign up for MyChart if you have not done so, as this will be important to you in the future with finding out test results, communicating by private email, and scheduling acute appointments online when needed.  Please return in 6 months, or sooner if needed

## 2015-09-23 NOTE — Assessment & Plan Note (Addendum)
Moderate, no SI or HI ro worsening depressive symptoms, for re-start cymbalta and xanax prn  In addition to the time spent performing CPE, I spent an additional 25 minutes face to face,in which greater than 50% of this time was spent in counseling and coordination of care for patient's acute illness as documented.

## 2015-09-23 NOTE — Telephone Encounter (Signed)
Left message that I would call patient closer to her appointment for colonoscopy and let her know she could come pick up a Suprep sample

## 2015-09-23 NOTE — Assessment & Plan Note (Addendum)
Etiology unclear, ? psychosomatic related, will need GI eval as planned, also spep, cxr

## 2015-09-24 ENCOUNTER — Telehealth: Payer: Self-pay

## 2015-09-24 LAB — HEPATITIS C ANTIBODY: HCV AB: NEGATIVE

## 2015-09-24 NOTE — Telephone Encounter (Signed)
Spoke with patient and told her I would call her closer to her procedure to come pick up a prep sample.  Patient agreed

## 2015-09-27 LAB — PROTEIN ELECTROPHORESIS, SERUM
ABNORMAL PROTEIN BAND1: 0.5 g/dL
ALPHA-1-GLOBULIN: 0.4 g/dL — AB (ref 0.2–0.3)
Albumin ELP: 3.8 g/dL (ref 3.8–4.8)
Alpha-2-Globulin: 0.8 g/dL (ref 0.5–0.9)
BETA 2: 0.5 g/dL (ref 0.2–0.5)
Beta Globulin: 0.5 g/dL (ref 0.4–0.6)
GAMMA GLOBULIN: 1.4 g/dL (ref 0.8–1.7)
TOTAL PROTEIN, SERUM ELECTROPHOR: 7.3 g/dL (ref 6.1–8.1)

## 2015-09-28 ENCOUNTER — Other Ambulatory Visit: Payer: Self-pay | Admitting: Internal Medicine

## 2015-09-28 DIAGNOSIS — R778 Other specified abnormalities of plasma proteins: Secondary | ICD-10-CM

## 2015-09-30 ENCOUNTER — Other Ambulatory Visit: Payer: Commercial Managed Care - HMO

## 2015-09-30 DIAGNOSIS — R769 Abnormal immunological finding in serum, unspecified: Secondary | ICD-10-CM | POA: Diagnosis not present

## 2015-09-30 DIAGNOSIS — R778 Other specified abnormalities of plasma proteins: Secondary | ICD-10-CM

## 2015-10-04 LAB — IMMUNOFIXATION, SERUM
IGM (IMMUNOGLOBULIN M), SRM: 98 mg/dL (ref 26–217)
IgA/Immunoglobulin A, Serum: 333 mg/dL (ref 87–352)
IgG (Immunoglobin G), Serum: 1371 mg/dL (ref 700–1600)

## 2015-10-05 ENCOUNTER — Encounter: Payer: Self-pay | Admitting: Internal Medicine

## 2015-10-14 ENCOUNTER — Other Ambulatory Visit: Payer: Self-pay | Admitting: Internal Medicine

## 2015-10-18 ENCOUNTER — Telehealth: Payer: Self-pay | Admitting: Emergency Medicine

## 2015-10-18 NOTE — Telephone Encounter (Signed)
Patient called about her blood test. Wants the nurse to give her a call back. Please follow up thanks.

## 2015-10-19 NOTE — Telephone Encounter (Signed)
Patient aware of lab results.

## 2015-10-25 ENCOUNTER — Encounter: Payer: Self-pay | Admitting: Gastroenterology

## 2015-10-25 ENCOUNTER — Telehealth: Payer: Self-pay | Admitting: Gastroenterology

## 2015-10-26 NOTE — Telephone Encounter (Signed)
Left message that I would call patient next week about picking up a sample (waiting on a shipment)

## 2015-11-02 ENCOUNTER — Telehealth: Payer: Self-pay

## 2015-11-02 NOTE — Telephone Encounter (Signed)
Spoke with patient and told her I would leave a Suprep sample up front for her to be picked up.  Patient agreed.

## 2015-11-08 ENCOUNTER — Encounter: Payer: Self-pay | Admitting: Gastroenterology

## 2015-11-08 ENCOUNTER — Ambulatory Visit (AMBULATORY_SURGERY_CENTER): Payer: Commercial Managed Care - HMO | Admitting: Gastroenterology

## 2015-11-08 VITALS — BP 125/74 | HR 79 | Temp 96.8°F | Resp 20 | Ht 69.0 in | Wt 206.0 lb

## 2015-11-08 DIAGNOSIS — I10 Essential (primary) hypertension: Secondary | ICD-10-CM | POA: Diagnosis not present

## 2015-11-08 DIAGNOSIS — Z1211 Encounter for screening for malignant neoplasm of colon: Secondary | ICD-10-CM

## 2015-11-08 DIAGNOSIS — R634 Abnormal weight loss: Secondary | ICD-10-CM | POA: Diagnosis not present

## 2015-11-08 MED ORDER — SODIUM CHLORIDE 0.9 % IV SOLN: 500.0000 mL | INTRAVENOUS | Status: DC

## 2015-11-08 NOTE — Progress Notes (Signed)
Report given to PACU RN, vss 

## 2015-11-08 NOTE — Progress Notes (Signed)
When removing I.V. Pt. Showed me rash that she had on her right forearm,she stated that Y'all didn't do that when I get stressed I break out like that.

## 2015-11-08 NOTE — Patient Instructions (Signed)
YOU HAD AN ENDOSCOPIC PROCEDURE TODAY AT THE [facility]:   Refer to the procedure report that was given to you for any specific questions about what was found during the examination.  If the procedure report does not answer your questions, please call your gastroenterologist to clarify.  If you requested that your care partner not be given the details of your procedure findings, then the procedure report has been included in a sealed envelope for you to review at your convenience later.  YOU SHOULD EXPECT: Some feelings of bloating in the abdomen. Passage of more gas than usual.  Walking can help get rid of the air that was put into your GI tract during the procedure and reduce the bloating. If you had a lower endoscopy (such as a colonoscopy or flexible sigmoidoscopy) you may notice spotting of blood in your stool or on the toilet paper. If you underwent a bowel prep for your procedure, you may not have a normal bowel movement for a few days.  Please Note:  You might notice some irritation and congestion in your nose or some drainage.  This is from the oxygen used during your procedure.  There is no need for concern and it should clear up in a day or so.  SYMPTOMS TO REPORT IMMEDIATELY:   Following lower endoscopy (colonoscopy or flexible sigmoidoscopy):  Excessive amounts of blood in the stool  Significant tenderness or worsening of abdominal pains  Swelling of the abdomen that is new, acute  Fever of 100F or higher   For urgent or emergent issues, a gastroenterologist can be reached at any hour by calling [phone].   DIET:  We do recommend a small meal at first, but then you may proceed to your regular diet.  Drink plenty of fluids but you should avoid alcoholic beverages for 24 hours.  ACTIVITY:  You should plan to take it easy for the rest of today and you should NOT DRIVE or use heavy machinery until tomorrow (because of the sedation medicines used during the test).     FOLLOW UP: Our staff will call the number listed on your records the next business day following your procedure to check on you and address any questions or concerns that you may have regarding the information given to you following your procedure. If we do not reach you, we will leave a message.  However, if you are feeling well and you are not experiencing any problems, there is no need to return our call.  We will assume that you have returned to your regular daily activities without incident.  If any biopsies were taken you will be contacted by phone or by letter within the next 1-3 weeks.  Please call us at [phone] if you have not heard about the biopsies in 3 weeks.    SIGNATURES/CONFIDENTIALITY: You and/or your care partner have signed paperwork which will be entered into your electronic medical record.  These signatures attest to the fact that that the information above on your After Visit Summary has been reviewed and is understood.  Full responsibility of the confidentiality of this discharge information lies with you and/or your care-partner.   Resume medications.  

## 2015-11-08 NOTE — Op Note (Signed)
[facility] Patient Name: Pamela [name] Procedure Date: 11/08/2015 3:27 PM MRN: [id number] Endoscopist: Milus Banister , MD Age: 60 Referring MD:  Date of Birth: May 19, 1955 Gender: Female Account #: [id number] Procedure:                Colonoscopy Indications:              Screening for colorectal malignant neoplasm Medicines:                Monitored Anesthesia Care Procedure:                Pre-Anesthesia Assessment:                           - Prior to the procedure, a History and Physical                            was performed, and patient medications and                            allergies were reviewed. The patient's tolerance of                            previous anesthesia was also reviewed. The risks                            and benefits of the procedure and the sedation                            options and risks were discussed with the patient.                            All questions were answered, and informed consent                            was obtained. Prior Anticoagulants: The patient has                            taken no previous anticoagulant or antiplatelet                            agents. ASA Grade Assessment: II - A patient with                            mild systemic disease. After reviewing the risks                            and benefits, the patient was deemed in                            satisfactory condition to undergo the procedure.                           After obtaining informed consent, the colonoscope  was passed under direct vision. Throughout the                            procedure, the patient's blood pressure, pulse, and                            oxygen saturations were monitored continuously. The                            Model CF-HQ190L [id number]) scope was introduced                            through the anus and advanced to the the cecum,                            identified by  appendiceal orifice and ileocecal                            valve. The colonoscopy was performed without                            difficulty. The patient tolerated the procedure                            well. The quality of the bowel preparation was                            good. The ileocecal valve, appendiceal orifice, and                            rectum were photographed. Scope In: 3:31:25 PM Scope Out: 3:43:54 PM Scope Withdrawal Time: 0 hours 9 minutes 18 seconds  Total Procedure Duration: 0 hours 12 minutes 29 seconds  Findings:                 The entire examined colon appeared normal on direct                            and retroflexion views. Complications:            No immediate complications. Estimated blood loss:                            None. Estimated Blood Loss:     Estimated blood loss: none. Impression:               - The entire examined colon is normal on direct and                            retroflexion views.                           - No specimens collected. Recommendation:           - Patient has a contact number available for  emergencies. The signs and symptoms of potential                            delayed complications were discussed with the                            patient. Return to normal activities tomorrow.                            Written discharge instructions were provided to the                            patient.                           - Resume previous diet.                           - Continue present medications.                           - Repeat colonoscopy in 10 years for screening                            purposes. Milus Banister, MD 11/08/2015 3:47:47 PM This report has been signed electronically.

## 2015-11-09 ENCOUNTER — Telehealth: Payer: Self-pay

## 2015-11-09 NOTE — Telephone Encounter (Signed)
*   Follow up Call-  Call back number 11/08/2015 05/05/2015  Post procedure Call Back phone  # [phone]  Permission to leave phone message Yes Yes  Some recent data might be hidden     Patient questions:  Do you have a fever, pain , or abdominal swelling? No. Pain Score  0 *  Have you tolerated food without any problems? Yes.    Have you been able to return to your normal activities? Yes.    Do you have any questions about your discharge instructions: Diet   No. Medications  No. Follow up visit  No.  Do you have questions or concerns about your Care? No.  Actions: * If pain score is 4 or above: No action needed, pain <4.

## 2015-11-10 ENCOUNTER — Other Ambulatory Visit: Payer: Self-pay | Admitting: Internal Medicine

## 2015-11-11 NOTE — Telephone Encounter (Signed)
Done hardcopy to corinne 

## 2015-11-11 NOTE — Telephone Encounter (Signed)
Rx faxed back to [facility]...Pamela [name]

## 2015-11-15 ENCOUNTER — Other Ambulatory Visit: Payer: Self-pay | Admitting: Internal Medicine

## 2015-11-16 NOTE — Telephone Encounter (Signed)
faxed

## 2015-11-16 NOTE — Telephone Encounter (Signed)
Done hardcopy to Corinne  

## 2015-12-09 ENCOUNTER — Ambulatory Visit (INDEPENDENT_AMBULATORY_CARE_PROVIDER_SITE_OTHER): Payer: Commercial Managed Care - HMO | Admitting: Internal Medicine

## 2015-12-09 ENCOUNTER — Ambulatory Visit: Payer: Commercial Managed Care - HMO | Admitting: Internal Medicine

## 2015-12-09 ENCOUNTER — Encounter: Payer: Self-pay | Admitting: Internal Medicine

## 2015-12-09 ENCOUNTER — Ambulatory Visit (INDEPENDENT_AMBULATORY_CARE_PROVIDER_SITE_OTHER)
Admission: RE | Admit: 2015-12-09 | Discharge: 2015-12-09 | Disposition: A | Discharge status: Home / Self Care | Payer: Commercial Managed Care - HMO | Source: Ambulatory Visit | Attending: Internal Medicine | Admitting: Internal Medicine

## 2015-12-09 VITALS — BP 142/82 | HR 80 | Temp 98.1°F | Resp 20 | Wt 207.4 lb

## 2015-12-09 DIAGNOSIS — M25562 Pain in left knee: Secondary | ICD-10-CM | POA: Diagnosis not present

## 2015-12-09 DIAGNOSIS — I10 Essential (primary) hypertension: Secondary | ICD-10-CM | POA: Diagnosis not present

## 2015-12-09 DIAGNOSIS — F411 Generalized anxiety disorder: Secondary | ICD-10-CM | POA: Diagnosis not present

## 2015-12-09 DIAGNOSIS — M25462 Effusion, left knee: Secondary | ICD-10-CM | POA: Diagnosis not present

## 2015-12-09 MED ORDER — HYDROCODONE-ACETAMINOPHEN 7.5-325 MG PO TABS: 1.0000 | ORAL_TABLET | Freq: Four times a day (QID) | ORAL | 0 refills | Status: DC | PRN

## 2015-12-09 NOTE — Progress Notes (Signed)
Subjective:    Patient ID: Pamela [name], female    DOB: 10-30-1955, 60 y.o.   MRN: [id number]  HPI  Here with 1 wk "leg pain" but on further hx means left knee pain, mod to severe with lateral pain and swelling;  Has been trying to giveaway, though always catches herself, no falls.  No twisting injury she is aware.  Has not been standing, lifting, or other unusual movements.  Just seemed to start up one day.  Sharp, constant, worse to walk, better to sit. No fever, trauma or hx of gout.  S/p medial left knee partial replacement aug 2016 per Dr Gladstone Lighter  Denies worsening depressive symptoms, suicidal ideation, or panic; Past Medical History:  Diagnosis Date  . ALLERGIC RHINITIS 12/18/2006   Qualifier: Diagnosis of  By: [place], [place]    . DEPRESSION, SITUATIONAL 03/15/2007   Qualifier: Diagnosis of  By: Wynona Luna   . HYPERLIPIDEMIA 12/18/2006   Qualifier: History of  By: Danny Lawless CMA, [place]    . Hypertension   . HYPERTENSION 12/18/2006   Qualifier: Diagnosis of  By: Danny Lawless CMA, [place]    . INSOMNIA-SLEEP DISORDER-UNSPEC 08/25/2009   Qualifier: Diagnosis of  By: Jenny Reichmann MD, [place] Oris   . RENAL CALCULUS, HX OF 12/18/2006   Qualifier: Diagnosis of  By: [place], [place]     Past Surgical History:  Procedure Laterality Date  . CERVICAL DISC SURGERY     x 3  . KNEE ARTHROSCOPY     partial    reports that she has quit smoking. She has never used smokeless tobacco. She reports that she does not drink alcohol or use drugs. family history includes Heart disease in her father; Lung cancer in her brother and sister; Stroke in her mother. No Known Allergies Current Outpatient Prescriptions on File Prior to Visit  Medication Sig Dispense Refill  . ALPRAZolam (XANAX) 0.25 MG tablet take 1 tablet by mouth twice a day if needed 60 tablet 5  . atorvastatin (LIPITOR) 20 MG tablet TAKE 1 TABLET (20 MG TOTAL) BY MOUTH DAILY. 90 tablet 2  . butalbital-acetaminophen-caffeine (FIORICET, ESGIC)  50-325-40 MG tablet Take 1 tablet by mouth 2 (two) times daily. 60 tablet 1  . cyclobenzaprine (FLEXERIL) 5 MG tablet take 1 tablet by mouth three times a day if needed 60 tablet 3  . dexlansoprazole (DEXILANT) 60 MG capsule Take 1 capsule (60 mg total) by mouth daily. Yearly physical w/labs are due must see MD for refills 90 capsule 1  . diclofenac sodium (VOLTAREN) 1 % GEL APPLY TO AFFECTED AREAS UPTO 3 TIMES A DAY AS NEEDED 100 g 1  . DULoxetine (CYMBALTA) 60 MG capsule Take 1 capsule (60 mg total) by mouth daily. 90 capsule 3  . fluocinonide (LIDEX) 0.05 % external solution Apply topically 2 (two) times daily. 60 mL 0  . irbesartan-hydrochlorothiazide (AVALIDE) 150-12.5 MG per tablet Take 1 tablet by mouth daily. 90 tablet 1  . losartan-hydrochlorothiazide (HYZAAR) 50-12.5 MG tablet Take 1 tablet by mouth daily. Yearly physical w/labs is due must see md for refills 90 tablet 0  . metoCLOPramide (REGLAN) 10 MG tablet Take 1 tablet (10 mg total) by mouth at bedtime. 30 tablet 3  . naproxen (NAPROSYN) 500 MG tablet take 1 tablet by mouth twice a day with food if needed for pain Yearly physical w/labs are due must see MD for refills 90 tablet 0  . traMADol (ULTRAM) 50 MG tablet take 1 tablet by mouth  every 6 hours if needed for pain 120 tablet 1  . zolpidem (AMBIEN) 10 MG tablet take 1 tablet by mouth at bedtime if needed for sleep 30 tablet 5   Current Facility-Administered Medications on File Prior to Visit  Medication Dose Route Frequency Provider Last Rate Last Dose  . 0.9 %  sodium chloride infusion  500 mL Intravenous Continuous Milus Banister, MD         Review of Systems  Constitutional: Negative for unusual diaphoresis or night sweats HENT: Negative for ear swelling or discharge Eyes: Negative for worsening visual haziness  Respiratory: Negative for choking and stridor.   Gastrointestinal: Negative for distension or worsening eructation Genitourinary: Negative for retention or  change in urine volume.  Musculoskeletal: Negative for other MSK pain or swelling Skin: Negative for color change and worsening wound Neurological: Negative for tremors and numbness other than noted  Psychiatric/Behavioral: Negative for decreased concentration or agitation other than above       Objective:   Physical Exam BP (!) 142/82   Pulse 80   Temp 98.1 F (36.7 C) (Oral)   Resp 20   Wt 207 lb 6 oz (94.1 kg)   SpO2 93%   BMI 30.62 kg/m  VS noted,  Constitutional: Pt appears in no apparent distress HENT: Head: NCAT.  Right Ear: External ear normal.  Left Ear: External ear normal.  Eyes: . Pupils are equal, round, and reactive to light. Conjunctivae and EOM are normal Neck: Normal range of motion. Neck supple.  Cardiovascular: Normal rate and regular rhythm.   Pulmonary/Chest: Effort normal and breath sounds without rales or wheezing.  Neurological: Pt is alert. Not confused , motor grossly intact Skin: Skin is warm. No rash, no LE edema Psychiatric: Pt behavior is normal. No agitation. 1+ nervous Left knee with 1-2=+ effusion, lateal heat but little tender only, decresaed ROM, assoc with distal trace leg sweling, calf NT, neg homans      Assessment & Plan:

## 2015-12-09 NOTE — Progress Notes (Signed)
Pre visit review using our clinic review tool, if applicable. No additional management support is needed unless otherwise documented below in the visit note. 

## 2015-12-09 NOTE — Patient Instructions (Signed)
Please take all new medication as prescribed - the pain medication  Please continue all other medications as before, and refills have been done if requested.  Please have the pharmacy call with any other refills you may need.  Please keep your appointments with your specialists as you may have planned  You will be contacted regarding the referral for: Dr Gladstone Lighter  Please go to the XRAY Department in the Basement (go straight as you get off the elevator) for the x-ray testing  You will be contacted by phone if any changes need to be made immediately.  Otherwise, you will receive a letter about your results with an explanation, but please check with MyChart first.  Please remember to sign up for MyChart if you have not done so, as this will be important to you in the future with finding out test results, communicating by private email, and scheduling acute appointments online when needed.

## 2015-12-10 NOTE — Assessment & Plan Note (Signed)
Mild situational increase, cont same tx,  to f/u any worsening symptoms or concerns

## 2015-12-10 NOTE — Assessment & Plan Note (Signed)
Mild elev today likely situational, stable overall by history and exam, recent data reviewed with pt, and pt to continue medical treatment as before,  to f/u any worsening symptoms or concerns BP Readings from Last 3 Encounters:  12/09/15 (!) 142/82  11/08/15 125/74  09/23/15 130/70

## 2015-12-10 NOTE — Assessment & Plan Note (Signed)
Exam c/w prob meniscal/cartilage tearing s/p previous medial knee replacement, for films today, pain control, and refer to ortho asap

## 2015-12-13 ENCOUNTER — Telehealth: Payer: Self-pay | Admitting: Internal Medicine

## 2015-12-13 NOTE — Telephone Encounter (Signed)
Patient requesting xray results

## 2015-12-14 NOTE — Telephone Encounter (Signed)
Letter has been sent  Knee has small swelling due to arthritis only, no fractures

## 2015-12-16 DIAGNOSIS — Z471 Aftercare following joint replacement surgery: Secondary | ICD-10-CM | POA: Diagnosis not present

## 2015-12-16 DIAGNOSIS — Z96652 Presence of left artificial knee joint: Secondary | ICD-10-CM | POA: Diagnosis not present

## 2015-12-29 ENCOUNTER — Other Ambulatory Visit: Payer: Self-pay | Admitting: Pharmacist

## 2015-12-29 NOTE — Patient Outreach (Signed)
Outreach call to Pamela [name] regarding her request for follow up from the [facility] Medication Adherence Campaign. Called and spoke with patient. HIPAA identifiers verified and verbal consent received.  [name] reports that she has been taking her atorvastatin daily as directed. Reports that she currently has the medication. However, reports that she does sometimes miss a dose when she does not have the money to get it refilled. Offer to discuss methods to address cost with [name]. Patient states that she would like to talk about this, but asks if I could call back on Friday.  PLAN:  Will call [name] back on Friday, 12/31/15 to discuss cost savings/medication assistance options.  Harlow Asa, PharmD Clinical Pharmacist [facility] [phone]

## 2015-12-31 ENCOUNTER — Other Ambulatory Visit: Payer: Self-pay | Admitting: Pharmacist

## 2015-12-31 ENCOUNTER — Ambulatory Visit: Payer: Self-pay | Admitting: Pharmacist

## 2015-12-31 NOTE — Patient Outreach (Signed)
Call to Ms. Fulwider to discuss medication assistance options based on her request for follow up. HIPAA identifiers verified and verbal consent received.  Ms. Frate reports that she occasionally has to wait to pick up her refills due to running out of money at the end of the month before she receives her monthly check. Counsel patient on the importance of medication adherence. Discuss with Ms. Krizek the option of using [facility] order for cost savings, particularly for the benefit of free tier 1 medications, such as atorvastatin. Ms. Huser reports that she is not interested in trying mail order. Ms. Eigsti reports that her income is too high to qualify for extra help.   Discuss with Ms. Shaw the strategy of setting aside money from her paycheck at the beginning of each month to cover the cost of her copayments.  Ms. Brecker reports that she has no further medication questions/concerns for me at this time. Provide patient with my phone number.  Harlow Asa, PharmD Clinical Pharmacist [facility] [phone]

## 2016-01-09 ENCOUNTER — Other Ambulatory Visit: Payer: Self-pay | Admitting: Internal Medicine

## 2016-01-18 ENCOUNTER — Other Ambulatory Visit: Payer: Self-pay | Admitting: Gastroenterology

## 2016-01-21 ENCOUNTER — Other Ambulatory Visit: Payer: Self-pay | Admitting: Internal Medicine

## 2016-01-21 NOTE — Telephone Encounter (Signed)
Done hardcopy to Corinne  

## 2016-01-24 NOTE — Telephone Encounter (Signed)
Rx faxed to pharmacy  

## 2016-01-27 ENCOUNTER — Other Ambulatory Visit: Payer: Self-pay | Admitting: Internal Medicine

## 2016-02-16 ENCOUNTER — Other Ambulatory Visit: Payer: Self-pay | Admitting: Internal Medicine

## 2016-02-16 NOTE — Telephone Encounter (Signed)
Done hardcopy to Corinne  

## 2016-02-17 NOTE — Telephone Encounter (Signed)
faxed

## 2016-03-15 ENCOUNTER — Ambulatory Visit (INDEPENDENT_AMBULATORY_CARE_PROVIDER_SITE_OTHER): Payer: Medicare HMO | Admitting: Internal Medicine

## 2016-03-15 ENCOUNTER — Encounter: Payer: Self-pay | Admitting: Internal Medicine

## 2016-03-15 VITALS — BP 140/82 | HR 105 | Temp 98.6°F | Resp 20 | Wt 212.0 lb

## 2016-03-15 DIAGNOSIS — R739 Hyperglycemia, unspecified: Secondary | ICD-10-CM

## 2016-03-15 DIAGNOSIS — I10 Essential (primary) hypertension: Secondary | ICD-10-CM | POA: Diagnosis not present

## 2016-03-15 DIAGNOSIS — M25562 Pain in left knee: Secondary | ICD-10-CM

## 2016-03-15 DIAGNOSIS — Z0001 Encounter for general adult medical examination with abnormal findings: Secondary | ICD-10-CM

## 2016-03-15 MED ORDER — HYDROCODONE-ACETAMINOPHEN 7.5-325 MG PO TABS: 1.0000 | ORAL_TABLET | Freq: Four times a day (QID) | ORAL | 0 refills | Status: DC | PRN

## 2016-03-15 MED ORDER — TRAMADOL HCL 50 MG PO TABS: ORAL_TABLET | ORAL | 1 refills | Status: DC

## 2016-03-15 NOTE — Progress Notes (Signed)
Subjective:    Patient ID: Pamela [name], female    DOB: 1955/08/05, 61 y.o.   MRN: [id number]  HPI  Here with 2 wks onset mod to severe persistent left knee pain and swelling, no specific trauma but has been walking and standing more during the recent holidays, pain now about 8/10, mostly to lateral aspect left knee with stiffness and reduced ROM, but no giveaways or falls. No fever, hx gout or trauma.  Has prior hx of similar to medial left knee s/p arthroscopy.  Pt denies chest pain, increased sob or doe, wheezing, orthopnea, PND, increased LE swelling, palpitations, dizziness or syncope.  Pt denies new neurological symptoms such as new headache, or facial or extremity weakness or numbness   Pt denies polydipsia, polyuria No other new history Past Medical History:  Diagnosis Date  . ALLERGIC RHINITIS 12/18/2006   Qualifier: Diagnosis of  By: [place], [place]    . DEPRESSION, SITUATIONAL 03/15/2007   Qualifier: Diagnosis of  By: Wynona Luna   . HYPERLIPIDEMIA 12/18/2006   Qualifier: History of  By: Danny Lawless CMA, [place]    . Hypertension   . HYPERTENSION 12/18/2006   Qualifier: Diagnosis of  By: Danny Lawless CMA, [place]    . INSOMNIA-SLEEP DISORDER-UNSPEC 08/25/2009   Qualifier: Diagnosis of  By: Jenny Reichmann MD, [place] Oris   . RENAL CALCULUS, HX OF 12/18/2006   Qualifier: Diagnosis of  By: [place], [place]     Past Surgical History:  Procedure Laterality Date  . CERVICAL DISC SURGERY     x 3  . KNEE ARTHROSCOPY     partial    reports that she has quit smoking. She has never used smokeless tobacco. She reports that she does not drink alcohol or use drugs. family history includes Heart disease in her father; Lung cancer in her brother and sister; Stroke in her mother. No Known Allergies Current Outpatient Prescriptions on File Prior to Visit  Medication Sig Dispense Refill  . ALPRAZolam (XANAX) 0.25 MG tablet take 1 tablet by mouth twice a day if needed 60 tablet 5  . atorvastatin (LIPITOR)  20 MG tablet TAKE 1 TABLET (20 MG TOTAL) BY MOUTH DAILY. 90 tablet 2  . butalbital-acetaminophen-caffeine (FIORICET, ESGIC) 50-325-40 MG tablet take 1 tablet by mouth twice a day 60 tablet 1  . cyclobenzaprine (FLEXERIL) 5 MG tablet take 1 tablet by mouth three times a day if needed 60 tablet 3  . dexlansoprazole (DEXILANT) 60 MG capsule Take 1 capsule (60 mg total) by mouth daily. Yearly physical w/labs are due must see MD for refills 90 capsule 1  . diclofenac sodium (VOLTAREN) 1 % GEL APPLY TO AFFECTED AREAS UPTO 3 TIMES A DAY AS NEEDED 100 g 1  . DULoxetine (CYMBALTA) 60 MG capsule Take 1 capsule (60 mg total) by mouth daily. 90 capsule 3  . fluocinonide (LIDEX) 0.05 % external solution Apply topically 2 (two) times daily. 60 mL 0  . irbesartan-hydrochlorothiazide (AVALIDE) 150-12.5 MG per tablet Take 1 tablet by mouth daily. 90 tablet 1  . losartan-hydrochlorothiazide (HYZAAR) 50-12.5 MG tablet Take 1 tablet by mouth daily. Yearly physical w/labs is due must see md for refills 90 tablet 0  . losartan-hydrochlorothiazide (HYZAAR) 50-12.5 MG tablet take 1 tablet by mouth once daily 90 tablet 1  . metoCLOPramide (REGLAN) 10 MG tablet take 1 tablet by mouth at bedtime 30 tablet 3  . naproxen (NAPROSYN) 500 MG tablet take 1 tablet by mouth twice a day with food  if needed for pain Yearly physical w/labs are due must see MD for refills 90 tablet 0  . zolpidem (AMBIEN) 10 MG tablet take 1 tablet by mouth at bedtime if needed for sleep 30 tablet 5   No current facility-administered medications on file prior to visit.    Review of Systems  Constitutional: Negative for unusual diaphoresis or night sweats HENT: Negative for ear swelling or discharge Eyes: Negative for worsening visual haziness  Respiratory: Negative for choking and stridor.   Gastrointestinal: Negative for distension or worsening eructation Genitourinary: Negative for retention or change in urine volume.  Musculoskeletal: Negative  for other MSK pain or swelling Skin: Negative for color change and worsening wound Neurological: Negative for tremors and numbness other than noted  Psychiatric/Behavioral: Negative for decreased concentration or agitation other than above   All other system neg per pt    Objective:   Physical Exam BP 140/82   Pulse (!) 105   Temp 98.6 F (37 C) (Oral)   Resp 20   Wt 212 lb (96.2 kg)   SpO2 92%   BMI 31.31 kg/m  VS noted,  Constitutional: Pt appears in no apparent distress HENT: Head: NCAT.  Right Ear: External ear normal.  Left Ear: External ear normal.  Eyes: . Pupils are equal, round, and reactive to light. Conjunctivae and EOM are normal Neck: Normal range of motion. Neck supple.  Cardiovascular: Normal rate and regular rhythm.   Pulmonary/Chest: Effort normal and breath sounds without rales or wheezing.  Neurological: Pt is alert. Not confused , motor grossly intact Skin: Skin is warm. No rash, no LE edema Psychiatric: Pt behavior is normal. No agitation.  Left knee with 1-2+ effusion, warm lateral aspect more than medial effusion, with crepitus    Assessment & Plan:

## 2016-03-15 NOTE — Progress Notes (Signed)
Pre visit review using our clinic review tool, if applicable. No additional management support is needed unless otherwise documented below in the visit note. 

## 2016-03-15 NOTE — Patient Instructions (Addendum)
Please take all new medication as prescribed - the tramadol for pain  Please continue all other medications as before, and refills have been done if requested.  Please have the pharmacy call with any other refills you may need.  Please keep your appointments with your specialists as you may have planned  You will be contacted regarding the referral for: Dr Smith/sports medicine in this office, but you can also make an appt as you leave at the desk  Please return in 3 months, or sooner if needed, with Lab testing done 3-5 days before

## 2016-03-19 NOTE — Assessment & Plan Note (Signed)
Lab Results  Component Value Date   HGBA1C 5.4 09/23/2015   stable overall by history and exam, recent data reviewed with pt, and pt to continue medical treatment as before,  to f/u any worsening symptoms or concerns

## 2016-03-19 NOTE — Assessment & Plan Note (Signed)
With suspicion for lateral meniscal tear, for pain control, and refer sports med for evaluatoin,  to f/u any worsening symptoms or concerns

## 2016-03-19 NOTE — Assessment & Plan Note (Signed)
stable overall by history and exam, recent data reviewed with pt, and pt to continue medical treatment as before,  to f/u any worsening symptoms or concerns BP Readings from Last 3 Encounters:  03/15/16 140/82  12/09/15 (!) 142/82  11/08/15 125/74

## 2016-03-30 ENCOUNTER — Ambulatory Visit: Payer: Medicare HMO | Admitting: Family Medicine

## 2016-04-01 ENCOUNTER — Other Ambulatory Visit: Payer: Self-pay | Admitting: Internal Medicine

## 2016-04-03 NOTE — Telephone Encounter (Signed)
Routing to dr john, please advise, thanks 

## 2016-04-04 NOTE — Telephone Encounter (Signed)
Tramadol refill too soon since was done for 2 mo rx in early jan 2018

## 2016-04-04 NOTE — Progress Notes (Signed)
[facility] [place], [place] [address] Phone: [phone] Subjective:    I'm seeing this patient by the request  of:    CC: left knee pain   [id number]  Pamela [name] is a 61 y.o. female coming in with complaint of Left knee pain. Patient did have a unilateral compartment replacement in October 2016. Patient states since then she has never felt that her knee will seem to be doing well. Patient is actually having increasing instability. Has had a workup knee has locked on numerous occasions. Did see another provider in September 2017. At that time she did have an x-ray. This was independently visualized by me. I'm concerned that there is potential loosening of the tibial aspect of the lateral compartment looking at the x-ray. Patient states that unfortunately she feels unstable with standing on her knee. Even with sitting she can have an operable popping sensation that can be very painful. Denies any radiation of the pain. Denies any numbness. States though that there is chronic swelling.     Past Medical History:  Diagnosis Date  . ALLERGIC RHINITIS 12/18/2006   Qualifier: Diagnosis of  By: [place], [place]    . DEPRESSION, SITUATIONAL 03/15/2007   Qualifier: Diagnosis of  By: Wynona Luna   . HYPERLIPIDEMIA 12/18/2006   Qualifier: History of  By: Danny Lawless CMA, [place]    . Hypertension   . HYPERTENSION 12/18/2006   Qualifier: Diagnosis of  By: Danny Lawless CMA, [place]    . INSOMNIA-SLEEP DISORDER-UNSPEC 08/25/2009   Qualifier: Diagnosis of  By: Jenny Reichmann MD, Hunt Oris   . RENAL CALCULUS, HX OF 12/18/2006   Qualifier: Diagnosis of  By: [place], [place]     Past Surgical History:  Procedure Laterality Date  . CERVICAL DISC SURGERY     x 3  . KNEE ARTHROSCOPY     partial   Social History   Social History  . Marital status: Married    Spouse name: N/A  . Number of children: 3  . Years of education: N/A   Social History Main Topics  .  Smoking status: Former Research scientist (life sciences)  . Smokeless tobacco: Never Used  . Alcohol use No     Comment: occasionally  . Drug use: No  . Sexual activity: Not Asked   Other Topics Concern  . None   Social History Narrative  . None   No Known Allergies Family History  Problem Relation Age of Onset  . Stroke Mother   . Heart disease Father   . Lung cancer Sister   . Lung cancer Brother     Past medical history, social, surgical and family history all reviewed in electronic medical record.  No pertanent information unless stated regarding to the chief complaint.   Review of Systems:Review of systems updated and as accurate as of 04/05/16  No headache, visual changes, nausea, vomiting, diarrhea, constipation, dizziness, abdominal pain, skin rash, fevers, chills, night sweats, weight loss, swollen lymph nodes, body aches, joint swelling, muscle aches, chest pain, shortness of breath, mood changes.   Objective  Blood pressure 130/88, pulse 79, height 5\' 9"  (1.753 m), weight 215 lb (97.5 kg), SpO2 99 %. Systems examined below as of 04/05/16   General: No apparent distress alert and oriented x3 mood and affect normal, dressed appropriately.  HEENT: Pupils equal, extraocular movements intact  Respiratory: Patient's speak in full sentences and does not appear short of breath  Cardiovascular: No lower extremity edema,  non tender, no erythema  Skin: Warm dry intact with no signs of infection or rash on extremities or on axial skeleton.  Abdomen: Soft nontender  Neuro: Cranial nerves II through XII are intact, neurovascularly intact in all extremities with 2+ DTRs and 2+ pulses.  Lymph: No lymphadenopathy of posterior or anterior cervical chain or axillae bilaterally.  Gait Antalgic gait  MSK:  Non tender with full range of motion and good stability and symmetric strength and tone of shoulders, elbows, wrist, hip, and ankles bilaterally.  Knee: Left Effusion noted of the knee. Tenderness mostly  over the medial joint line but also has tenderness over the patellofemoral joint. ROM full in flexion and extension and lower leg rotation. Instability with varus force and audible popping noted. painful patellar compression. Patellar glide with moderate crepitus. Patellar and quadriceps tendons unremarkable. Hamstring and quadriceps strength is normal.  Contralateral knee fairly unremarkable.   Impression and Recommendations:     This case required medical decision making of moderate complexity.      Note: This dictation was prepared with Dragon dictation along with smaller phrase technology. Any transcriptional errors that result from this process are unintentional.

## 2016-04-05 ENCOUNTER — Encounter: Payer: Self-pay | Admitting: Family Medicine

## 2016-04-05 ENCOUNTER — Ambulatory Visit (INDEPENDENT_AMBULATORY_CARE_PROVIDER_SITE_OTHER): Payer: Medicare HMO | Admitting: Family Medicine

## 2016-04-05 VITALS — BP 130/88 | HR 79 | Ht 69.0 in | Wt 215.0 lb

## 2016-04-05 DIAGNOSIS — M25562 Pain in left knee: Secondary | ICD-10-CM

## 2016-04-05 DIAGNOSIS — T8484XA Pain due to internal orthopedic prosthetic devices, implants and grafts, initial encounter: Secondary | ICD-10-CM | POA: Diagnosis not present

## 2016-04-05 DIAGNOSIS — Z96659 Presence of unspecified artificial knee joint: Secondary | ICD-10-CM | POA: Diagnosis not present

## 2016-04-05 DIAGNOSIS — Z96652 Presence of left artificial knee joint: Secondary | ICD-10-CM | POA: Diagnosis not present

## 2016-04-05 NOTE — Patient Instructions (Signed)
Good to see you  We will try to get you in with [facility].  pennsaid pinkie amount topically 2 times daily as needed.  Wear brace  Keep me updated on what is going on and I am here to help

## 2016-04-05 NOTE — Assessment & Plan Note (Signed)
Patient is having joint effusion with instability. Patient's x-rays are somewhat concerning for possible small loosening of the tibial component of the medial compartment. Discussed with patient at great length. At this point this is affecting daily activities as well as such things as walking. Patient is unable to work out due to the concern of falling. Chronic pain and swelling noted as well. We discussed potential further imaging including low dose MRI, bone scan. I would like patient to be further evaluated by orthopedic surgeon to discuss further management. Patient can come back if she has questions or concerns.  Spent  25 minutes with patient face-to-face and had greater than 50% of counseling including as described above in assessment and plan.

## 2016-04-07 DIAGNOSIS — Z471 Aftercare following joint replacement surgery: Secondary | ICD-10-CM | POA: Diagnosis not present

## 2016-04-07 DIAGNOSIS — Z96652 Presence of left artificial knee joint: Secondary | ICD-10-CM | POA: Diagnosis not present

## 2016-04-07 DIAGNOSIS — M25561 Pain in right knee: Secondary | ICD-10-CM | POA: Diagnosis not present

## 2016-04-10 NOTE — Progress Notes (Deleted)
[facility] [place],  [address] Phone: [phone] Subjective:    I'm seeing this patient by the request  of:    CC: left knee pain   [id number]  Pamela [name] is a 61 y.o. female coming in with complaint of Left knee pain. Patient did have a unilateral compartment replacement in October 2016. Patient states since then she has never felt that her knee will seem to be doing well. Patient is actually having increasing instability. Has had a workup knee has locked on numerous occasions. Did see another provider in September 2017. At that time she did have an x-ray. This was independently visualized by me. I'm concerned that there is potential loosening of the tibial aspect of the lateral compartment looking at the x-ray. Patient states that unfortunately she feels unstable with standing on her knee. Even with sitting she can have an operable popping sensation that can be very painful. Denies any radiation of the pain. Denies any numbness. States though that there is chronic swelling.     Past Medical History:  Diagnosis Date  . ALLERGIC RHINITIS 12/18/2006   Qualifier: Diagnosis of  By: [place], [place]    . DEPRESSION, SITUATIONAL 03/15/2007   Qualifier: Diagnosis of  By: Wynona Luna   . HYPERLIPIDEMIA 12/18/2006   Qualifier: History of  By: Danny Lawless CMA, [place]    . Hypertension   . HYPERTENSION 12/18/2006   Qualifier: Diagnosis of  By: Danny Lawless CMA, [place]    . INSOMNIA-SLEEP DISORDER-UNSPEC 08/25/2009   Qualifier: Diagnosis of  By: Jenny Reichmann MD, Hunt Oris   . RENAL CALCULUS, HX OF 12/18/2006   Qualifier: Diagnosis of  By: [place], [place]     Past Surgical History:  Procedure Laterality Date  . CERVICAL DISC SURGERY     x 3  . KNEE ARTHROSCOPY     partial   Social History   Social History  . Marital status: Married    Spouse name: N/A  . Number of children: 3  . Years of education: N/A   Social History Main Topics  .  Smoking status: Former Research scientist (life sciences)  . Smokeless tobacco: Never Used  . Alcohol use No     Comment: occasionally  . Drug use: No  . Sexual activity: Not on file   Other Topics Concern  . Not on file   Social History Narrative  . No narrative on file   No Known Allergies Family History  Problem Relation Age of Onset  . Stroke Mother   . Heart disease Father   . Lung cancer Sister   . Lung cancer Brother     Past medical history, social, surgical and family history all reviewed in electronic medical record.  No pertanent information unless stated regarding to the chief complaint.   Review of Systems:Review of systems updated and as accurate as of 04/10/16  No headache, visual changes, nausea, vomiting, diarrhea, constipation, dizziness, abdominal pain, skin rash, fevers, chills, night sweats, weight loss, swollen lymph nodes, body aches, joint swelling, muscle aches, chest pain, shortness of breath, mood changes.   Objective  There were no vitals taken for this visit. Systems examined below as of 04/10/16   General: No apparent distress alert and oriented x3 mood and affect normal, dressed appropriately.  HEENT: Pupils equal, extraocular movements intact  Respiratory: Patient's speak in full sentences and does not appear short of breath  Cardiovascular: No lower extremity edema, non tender, no erythema  Skin: Warm dry intact with no signs of infection or rash on extremities or on axial skeleton.  Abdomen: Soft nontender  Neuro: Cranial nerves II through XII are intact, neurovascularly intact in all extremities with 2+ DTRs and 2+ pulses.  Lymph: No lymphadenopathy of posterior or anterior cervical chain or axillae bilaterally.  Gait Antalgic gait  MSK:  Non tender with full range of motion and good stability and symmetric strength and tone of shoulders, elbows, wrist, hip, and ankles bilaterally.  Knee: Left Effusion noted of the knee. Tenderness mostly over the medial joint line  but also has tenderness over the patellofemoral joint. ROM full in flexion and extension and lower leg rotation. Instability with varus force and audible popping noted. painful patellar compression. Patellar glide with moderate crepitus. Patellar and quadriceps tendons unremarkable. Hamstring and quadriceps strength is normal.  Contralateral knee fairly unremarkable.   Impression and Recommendations:     This case required medical decision making of moderate complexity.      Note: This dictation was prepared with Dragon dictation along with smaller phrase technology. Any transcriptional errors that result from this process are unintentional.

## 2016-04-11 ENCOUNTER — Ambulatory Visit: Payer: Medicare HMO | Admitting: Family Medicine

## 2016-04-13 ENCOUNTER — Other Ambulatory Visit: Payer: Self-pay | Admitting: Internal Medicine

## 2016-04-13 NOTE — Telephone Encounter (Signed)
faxed

## 2016-04-13 NOTE — Telephone Encounter (Signed)
Done hardcopy to Corinne  

## 2016-04-19 ENCOUNTER — Other Ambulatory Visit: Payer: Self-pay | Admitting: Internal Medicine

## 2016-05-03 ENCOUNTER — Other Ambulatory Visit: Payer: Self-pay | Admitting: Internal Medicine

## 2016-05-03 NOTE — Telephone Encounter (Signed)
Done hardcopy to anna 

## 2016-05-04 NOTE — Telephone Encounter (Signed)
RX faxed to [facility]

## 2016-05-05 ENCOUNTER — Other Ambulatory Visit: Payer: Self-pay | Admitting: Internal Medicine

## 2016-05-05 ENCOUNTER — Telehealth: Payer: Self-pay | Admitting: Internal Medicine

## 2016-05-05 NOTE — Telephone Encounter (Signed)
Done hardcopy to Anna  

## 2016-05-05 NOTE — Telephone Encounter (Signed)
Faxed RX zolpidem to [facility]

## 2016-05-05 NOTE — Telephone Encounter (Signed)
Last refill 11/11/15 with 5 refills, last OV 03/16/15. Please advise.

## 2016-05-12 ENCOUNTER — Other Ambulatory Visit: Payer: Self-pay | Admitting: Gastroenterology

## 2016-05-14 ENCOUNTER — Other Ambulatory Visit: Payer: Self-pay | Admitting: Gastroenterology

## 2016-06-01 ENCOUNTER — Ambulatory Visit (INDEPENDENT_AMBULATORY_CARE_PROVIDER_SITE_OTHER): Payer: Medicare HMO | Admitting: Internal Medicine

## 2016-06-01 ENCOUNTER — Encounter: Payer: Self-pay | Admitting: Internal Medicine

## 2016-06-01 ENCOUNTER — Ambulatory Visit: Payer: Medicare HMO | Admitting: Internal Medicine

## 2016-06-01 VITALS — BP 126/78 | HR 91 | Temp 98.2°F | Ht 69.0 in | Wt 218.0 lb

## 2016-06-01 DIAGNOSIS — M25462 Effusion, left knee: Secondary | ICD-10-CM

## 2016-06-01 MED ORDER — HYDROCODONE-ACETAMINOPHEN 7.5-325 MG PO TABS: 1.0000 | ORAL_TABLET | Freq: Four times a day (QID) | ORAL | 0 refills | Status: DC | PRN

## 2016-06-01 NOTE — Progress Notes (Signed)
Pre visit review using our clinic review tool, if applicable. No additional management support is needed unless otherwise documented below in the visit note. 

## 2016-06-01 NOTE — Patient Instructions (Addendum)
Please continue all other medications as before, and refills have been done if requested - the norco  Please have the pharmacy call with any other refills you may need.  Please keep your appointments with your specialists as you may have planned - Dr Arlington Calix  Please return in 4 months, or sooner if needed, with Lab testing done 3-5 days before

## 2016-06-01 NOTE — Assessment & Plan Note (Signed)
s/p surgury and follow closely per Dr Arlington Calix, recent cortisone helped several days, but pt today with marked effusion, pain on ambulation; no recent falls, I have d/w pt we do not intend to treat her with chronic narcotic, but I will refill the norco for now until she can be seen per Ortho as planned pt stated Apr 24.   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

## 2016-06-01 NOTE — Progress Notes (Signed)
Subjective:    Patient ID: Pamela [name], female    DOB: [birth year], 61 y.o.   MRN: [id number]  HPI    Here with c/o worsening pain to left knee about 3 wks, s/p left knee surgury, had seen Dr Arlington Calix with what sounds like knee injection with cortisone about 3 wks ago, helped for several days, but got worse again with pain and swelling, and then also had pain (no swelling)located left mid foot medially, after gait change assoc with left knee.  Has f/u with Dr Alvan Dame next month, but just cant get by without pain med until then.  Asks for refill norco until can be seen again, has tried tramadol in past and did not help, as well as an nsaid per ortho.  Knee seems weak with near giveaways but no fallings.  No fever, trauma. , no hx of gout. Did have plain film with last visit to Dr Alvan Dame, with DJD per pt report.  Last cortisone shot has been years ago.  But more walking more recently Past Medical History:  Diagnosis Date  . ALLERGIC RHINITIS 12/18/2006   Qualifier: Diagnosis of  By: [place], [place]    . DEPRESSION, SITUATIONAL 03/15/2007   Qualifier: Diagnosis of  By: Wynona Luna   . HYPERLIPIDEMIA 12/18/2006   Qualifier: History of  By: Danny Lawless CMA, [place]    . Hypertension   . HYPERTENSION 12/18/2006   Qualifier: Diagnosis of  By: Danny Lawless CMA, [place]    . INSOMNIA-SLEEP DISORDER-UNSPEC 08/25/2009   Qualifier: Diagnosis of  By: Jenny Reichmann MD, Hunt Oris   . RENAL CALCULUS, HX OF 12/18/2006   Qualifier: Diagnosis of  By: [place], [place]     Past Surgical History:  Procedure Laterality Date  . CERVICAL DISC SURGERY     x 3  . KNEE ARTHROSCOPY     partial    reports that she has quit smoking. She has never used smokeless tobacco. She reports that she does not drink alcohol or use drugs. family history includes Heart disease in her father; Lung cancer in her brother and sister; Stroke in her mother. No Known Allergies Current Outpatient Prescriptions on File Prior to Visit  Medication Sig  Dispense Refill  . ALPRAZolam (XANAX) 0.25 MG tablet take 1 tablet by mouth twice a day if needed 60 tablet 5  . atorvastatin (LIPITOR) 20 MG tablet TAKE 1 TABLET (20 MG TOTAL) BY MOUTH DAILY. 90 tablet 2  . butalbital-acetaminophen-caffeine (FIORICET, ESGIC) 50-325-40 MG tablet take 1 tablet by mouth twice a day 60 tablet 1  . cyclobenzaprine (FLEXERIL) 5 MG tablet take 1 tablet by mouth three times a day if needed 60 tablet 0  . diclofenac sodium (VOLTAREN) 1 % GEL APPLY TO AFFECTED AREAS UPTO 3 TIMES A DAY AS NEEDED 100 g 1  . DULoxetine (CYMBALTA) 60 MG capsule Take 1 capsule (60 mg total) by mouth daily. 90 capsule 3  . fluocinonide (LIDEX) 0.05 % external solution Apply topically 2 (two) times daily. 60 mL 0  . HYDROcodone-acetaminophen (NORCO) 7.5-325 MG tablet Take 1 tablet by mouth every 6 (six) hours as needed for moderate pain. 60 tablet 0  . irbesartan-hydrochlorothiazide (AVALIDE) 150-12.5 MG per tablet Take 1 tablet by mouth daily. 90 tablet 1  . losartan-hydrochlorothiazide (HYZAAR) 50-12.5 MG tablet Take 1 tablet by mouth daily. Yearly physical w/labs is due must see md for refills 90 tablet 0  . losartan-hydrochlorothiazide (HYZAAR) 50-12.5 MG tablet take 1 tablet by mouth once  daily 90 tablet 1  . metoCLOPramide (REGLAN) 10 MG tablet take 1 tablet by mouth at bedtime 30 tablet 1  . metoCLOPramide (REGLAN) 10 MG tablet take 1 tablet by mouth at bedtime 30 tablet 1  . naproxen (NAPROSYN) 500 MG tablet take 1 tablet by mouth twice a day with food if needed for pain Yearly physical w/labs are due must see MD for refills 90 tablet 0  . traMADol (ULTRAM) 50 MG tablet take 1 tablet by mouth every 6 hours if needed for pain 120 tablet 1  . zolpidem (AMBIEN) 10 MG tablet take 1 tablet by mouth at bedtime if needed for sleep 90 tablet 1   No current facility-administered medications on file prior to visit.    Review of Systems All other system neg per pt    Objective:   Physical  Exam BP 126/78   Pulse 91   Temp 98.2 F (36.8 C) (Oral)   Ht 5\' 9"  (1.753 m)   Wt 218 lb (98.9 kg)   SpO2 97%   BMI 32.19 kg/m  VS noted, obvious left knee pain with limping to walk, antalgic gait Constitutional: Pt appears in no apparent distress HENT: Head: NCAT.  Right Ear: External ear normal.  Left Ear: External ear normal.  Eyes: . Pupils are equal, round, and reactive to light. Conjunctivae and EOM are normal Neck: Normal range of motion. Neck supple.  Cardiovascular: Normal rate and regular rhythm.   Pulmonary/Chest: Effort normal and breath sounds without rales or wheezing.  Left knee witht 2-3+ effusion, diffuse tender, warm without overlying skin change, has reduced ROM to full extension by about 5 degrees Left foot without tender, swelling o2/ neurovasc intact Neurological: Pt is alert. Not confused , motor grossly intact Skin: Skin is warm. No rash, no LE edema Psychiatric: Pt behavior is normal. No agitation. mild nervous No other exam findings    Assessment & Plan:

## 2016-06-13 DIAGNOSIS — Z96652 Presence of left artificial knee joint: Secondary | ICD-10-CM | POA: Diagnosis not present

## 2016-06-13 DIAGNOSIS — Z471 Aftercare following joint replacement surgery: Secondary | ICD-10-CM | POA: Diagnosis not present

## 2016-06-13 DIAGNOSIS — M25562 Pain in left knee: Secondary | ICD-10-CM | POA: Diagnosis not present

## 2016-06-13 DIAGNOSIS — M1712 Unilateral primary osteoarthritis, left knee: Secondary | ICD-10-CM | POA: Diagnosis not present

## 2016-07-08 ENCOUNTER — Other Ambulatory Visit: Payer: Self-pay | Admitting: Internal Medicine

## 2016-07-13 ENCOUNTER — Other Ambulatory Visit: Payer: Self-pay | Admitting: Internal Medicine

## 2016-07-24 ENCOUNTER — Other Ambulatory Visit: Payer: Self-pay | Admitting: Internal Medicine

## 2016-07-25 ENCOUNTER — Telehealth: Payer: Self-pay | Admitting: *Deleted

## 2016-07-25 MED ORDER — DICLOFENAC SODIUM 1 % TD GEL: TRANSDERMAL | 1 refills | Status: DC

## 2016-07-25 NOTE — Telephone Encounter (Signed)
Notified pt w/MD response. Pt states since she has already had a knee replacement Dr. Tamala Julian will not see her, but she will make f/u appt w/Dr. Alvan Dame...Pamela [name]

## 2016-07-25 NOTE — Telephone Encounter (Signed)
Pt left msg on triage stating she is having some swelling and pain in her knee requesting MD to rx something for arthritis pain...[name]

## 2016-07-25 NOTE — Telephone Encounter (Signed)
Ok for voltaren gel topical prn  Also ask pt to see Dr Pamela [name] for left knee pain and swelling if she is not already seeing orthopedic

## 2016-08-01 ENCOUNTER — Other Ambulatory Visit: Payer: Self-pay | Admitting: Internal Medicine

## 2016-08-02 NOTE — Telephone Encounter (Signed)
faxed

## 2016-08-02 NOTE — Telephone Encounter (Signed)
Done hardcopy to [place]  

## 2016-08-17 DIAGNOSIS — M25562 Pain in left knee: Secondary | ICD-10-CM | POA: Diagnosis not present

## 2016-08-17 DIAGNOSIS — Z96652 Presence of left artificial knee joint: Secondary | ICD-10-CM | POA: Diagnosis not present

## 2016-08-17 DIAGNOSIS — Z471 Aftercare following joint replacement surgery: Secondary | ICD-10-CM | POA: Diagnosis not present

## 2016-08-31 ENCOUNTER — Other Ambulatory Visit: Payer: Self-pay | Admitting: Gastroenterology

## 2016-09-14 ENCOUNTER — Telehealth: Payer: Self-pay | Admitting: Internal Medicine

## 2016-09-14 NOTE — Telephone Encounter (Signed)
Pt called checking on her surgical clearance form. She mentioned that it was to be left up front for her to pick up? Please advise.

## 2016-09-14 NOTE — Telephone Encounter (Signed)
Dr. Jenny Reichmann have you seen this paperwork?

## 2016-09-14 NOTE — Telephone Encounter (Signed)
Called pt, LVM.   

## 2016-09-14 NOTE — Telephone Encounter (Signed)
I cant recall directly (though I think I did sign a form a few days ago), but if there was it is signed and faxed back  She may wish to contact the [place] office to see if this has been received, but if not, please have them forward another form to me to sign

## 2016-09-14 NOTE — Telephone Encounter (Signed)
Can you check to see if it is up front. I do not have any paperwork for her at my desk. Thanks!

## 2016-09-14 NOTE — Telephone Encounter (Signed)
I do not see it in the cabinet.

## 2016-09-15 NOTE — Telephone Encounter (Signed)
Done hardcopy to [place]  

## 2016-09-15 NOTE — Telephone Encounter (Signed)
Has been faxed to [place]

## 2016-09-15 NOTE — Telephone Encounter (Signed)
Spoke with pt, I do not see that the surgical clearance form has been scanned into her chart yet. She spoke with Dr. [facility] office and they didn't receive the fax. I tiold pt I would see if you could do a letter to be faxed over clearing her for surgery. I explained that our scanning dept is off-site. Please advise.

## 2016-09-20 ENCOUNTER — Ambulatory Visit: Payer: Medicare HMO | Admitting: Internal Medicine

## 2016-09-30 ENCOUNTER — Other Ambulatory Visit: Payer: Self-pay | Admitting: Internal Medicine

## 2016-10-02 NOTE — Patient Instructions (Addendum)
Pamela [name]  10/02/2016   Your procedure is scheduled on: 10/09/16  Report to [facility] Main  Entrance .             Report to admitting at     1000 AM   Call this number if you have problems the morning of surgery  [phone]   Remember: ONLY 1 PERSON MAY GO WITH YOU TO SHORT STAY TO GET  READY MORNING OF [place].  Do not eat food or drink liquids :After Midnight.     Take these medicines the morning of surgery with A SIP OF WATER: Xanax if needed, Cyclobenzeprine (flexril) if needed                                You may not have any metal on your body including hair pins and              piercings  Do not wear jewelry, make-up, lotions, powders or perfumes, deodorant             Do not wear nail polish.  Do not shave  48 hours prior to surgery.               Do not bring valuables to the hospital. [place].  Contacts, dentures or bridgework may not be worn into surgery.  Leave suitcase in the car. After surgery it may be brought to your room.                  Please read over the following fact sheets you were given: _____________________________________________________________________           [facility] - Preparing for Surgery Before surgery, you can play an important role.  Because skin is not sterile, your skin needs to be as free of germs as possible.  You can reduce the number of germs on your skin by washing with CHG (chlorahexidine gluconate) soap before surgery.  CHG is an antiseptic cleaner which kills germs and bonds with the skin to continue killing germs even after washing. Please DO NOT use if you have an allergy to CHG or antibacterial soaps.  If your skin becomes reddened/irritated stop using the CHG and inform your nurse when you arrive at Short Stay. Do not shave (including legs and underarms) for at least 48 hours prior to the first CHG shower.  You may shave your  face/neck. Please follow these instructions carefully:  1.  Shower with CHG Soap the night before surgery and the  morning of Surgery.  2.  If you choose to wash your hair, wash your hair first as usual with your  normal  shampoo.  3.  After you shampoo, rinse your hair and body thoroughly to remove the  shampoo.                           4.  Use CHG as you would any other liquid soap.  You can apply chg directly  to the skin and wash                       Gently with a scrungie or clean  washcloth.  5.  Apply the CHG Soap to your body ONLY FROM THE NECK DOWN.   Do not use on face/ open                           Wound or open sores. Avoid contact with eyes, ears mouth and genitals (private parts).                       Wash face,  Genitals (private parts) with your normal soap.             6.  Wash thoroughly, paying special attention to the area where your surgery  will be performed.  7.  Thoroughly rinse your body with warm water from the neck down.  8.  DO NOT shower/wash with your normal soap after using and rinsing off  the CHG Soap.                9.  Pat yourself dry with a clean towel.            10.  Wear clean pajamas.            11.  Place clean sheets on your bed the night of your first shower and do not  sleep with pets. Day of Surgery : Do not apply any lotions/deodorants the morning of surgery.  Please wear clean clothes to the hospital/surgery center.  FAILURE TO FOLLOW THESE INSTRUCTIONS MAY RESULT IN THE CANCELLATION OF YOUR SURGERY PATIENT SIGNATURE_________________________________  NURSE SIGNATURE__________________________________  ________________________________________________________________________  WHAT IS A BLOOD TRANSFUSION? Blood Transfusion Information  A transfusion is the replacement of blood or some of its parts. Blood is made up of multiple cells which provide different functions.  Red blood cells carry oxygen and are used for blood loss  replacement.  White blood cells fight against infection.  Platelets control bleeding.  Plasma helps clot blood.  Other blood products are available for specialized needs, such as hemophilia or other clotting disorders. BEFORE THE TRANSFUSION  Who gives blood for transfusions?   Healthy volunteers who are fully evaluated to make sure their blood is safe. This is blood bank blood. Transfusion therapy is the safest it has ever been in the practice of medicine. Before blood is taken from a donor, a complete history is taken to make sure that person has no history of diseases nor engages in risky social behavior (examples are intravenous drug use or sexual activity with multiple partners). The donor's travel history is screened to minimize risk of transmitting infections, such as malaria. The donated blood is tested for signs of infectious diseases, such as HIV and hepatitis. The blood is then tested to be sure it is compatible with you in order to minimize the chance of a transfusion reaction. If you or a relative donates blood, this is often done in anticipation of surgery and is not appropriate for emergency situations. It takes many days to process the donated blood. RISKS AND COMPLICATIONS Although transfusion therapy is very safe and saves many lives, the main dangers of transfusion include:   Getting an infectious disease.  Developing a transfusion reaction. This is an allergic reaction to something in the blood you were given. Every precaution is taken to prevent this. The decision to have a blood transfusion has been considered carefully by your caregiver before blood is given. Blood is not given unless the benefits outweigh the risks. AFTER THE TRANSFUSION  Right after receiving a blood transfusion, you will usually feel much better and more energetic. This is especially true if your red blood cells have gotten low (anemic). The transfusion raises the level of the red blood cells which  carry oxygen, and this usually causes an energy increase.  The nurse administering the transfusion will monitor you carefully for complications. HOME CARE INSTRUCTIONS  No special instructions are needed after a transfusion. You may find your energy is better. Speak with your caregiver about any limitations on activity for underlying diseases you may have. SEEK MEDICAL CARE IF:   Your condition is not improving after your transfusion.  You develop redness or irritation at the intravenous (IV) site. SEEK IMMEDIATE MEDICAL CARE IF:  Any of the following symptoms occur over the next 12 hours:  Shaking chills.  You have a temperature by mouth above 102 F (38.9 C), not controlled by medicine.  Chest, back, or muscle pain.  People around you feel you are not acting correctly or are confused.  Shortness of breath or difficulty breathing.  Dizziness and fainting.  You get a rash or develop hives.  You have a decrease in urine output.  Your urine turns a dark color or changes to pink, red, or brown. Any of the following symptoms occur over the next 10 days:  You have a temperature by mouth above 102 F (38.9 C), not controlled by medicine.  Shortness of breath.  Weakness after normal activity.  The white part of the eye turns yellow (jaundice).  You have a decrease in the amount of urine or are urinating less often.  Your urine turns a dark color or changes to pink, red, or brown. Document Released: 02/25/2000 Document Revised: 05/22/2011 Document Reviewed: 10/14/2007 ExitCare Patient Information 2014 [place].  _______________________________________________________________________  Incentive Spirometer  An incentive spirometer is a tool that can help keep your lungs clear and active. This tool measures how well you are filling your lungs with each breath. Taking long deep breaths may help reverse or decrease the chance of developing breathing (pulmonary) problems  (especially infection) following:  A long period of time when you are unable to move or be active. BEFORE THE PROCEDURE   If the spirometer includes an indicator to show your best effort, your nurse or respiratory therapist will set it to a desired goal.  If possible, sit up straight or lean slightly forward. Try not to slouch.  Hold the incentive spirometer in an upright position. INSTRUCTIONS FOR USE  1. Sit on the edge of your bed if possible, or sit up as far as you can in bed or on a chair. 2. Hold the incentive spirometer in an upright position. 3. Breathe out normally. 4. Place the mouthpiece in your mouth and seal your lips tightly around it. 5. Breathe in slowly and as deeply as possible, raising the piston or the ball toward the top of the column. 6. Hold your breath for 3-5 seconds or for as long as possible. Allow the piston or ball to fall to the bottom of the column. 7. Remove the mouthpiece from your mouth and breathe out normally. 8. Rest for a few seconds and repeat Steps 1 through 7 at least 10 times every 1-2 hours when you are awake. Take your time and take a few normal breaths between deep breaths. 9. The spirometer may include an indicator to show your best effort. Use the indicator as a goal to work toward during each repetition. 10. After  each set of 10 deep breaths, practice coughing to be sure your lungs are clear. If you have an incision (the cut made at the time of surgery), support your incision when coughing by placing a pillow or rolled up towels firmly against it. Once you are able to get out of bed, walk around indoors and cough well. You may stop using the incentive spirometer when instructed by your caregiver.  RISKS AND COMPLICATIONS  Take your time so you do not get dizzy or light-headed.  If you are in pain, you may need to take or ask for pain medication before doing incentive spirometry. It is harder to take a deep breath if you are having  pain. AFTER USE  Rest and breathe slowly and easily.  It can be helpful to keep track of a log of your progress. Your caregiver can provide you with a simple table to help with this. If you are using the spirometer at home, follow these instructions: [place] IF:   You are having difficultly using the spirometer.  You have trouble using the spirometer as often as instructed.  Your pain medication is not giving enough relief while using the spirometer.  You develop fever of 100.5 F (38.1 C) or higher. SEEK IMMEDIATE MEDICAL CARE IF:   You cough up bloody sputum that had not been present before.  You develop fever of 102 F (38.9 C) or greater.  You develop worsening pain at or near the incision site. MAKE SURE YOU:   Understand these instructions.  Will watch your condition.  Will get help right away if you are not doing well or get worse. Document Released: 07/10/2006 Document Revised: 05/22/2011 Document Reviewed: 09/10/2006 [facility] Patient Information 2014 [place], [place].   ________________________________________________________________________

## 2016-10-03 ENCOUNTER — Encounter (HOSPITAL_COMMUNITY): Payer: Self-pay

## 2016-10-03 ENCOUNTER — Encounter (HOSPITAL_COMMUNITY)
Admission: RE | Admit: 2016-10-03 | Discharge: 2016-10-03 | Disposition: A | Discharge status: Home / Self Care | Payer: Medicare HMO | Source: Ambulatory Visit | Attending: Orthopedic Surgery | Admitting: Orthopedic Surgery

## 2016-10-03 DIAGNOSIS — Z01812 Encounter for preprocedural laboratory examination: Secondary | ICD-10-CM | POA: Diagnosis not present

## 2016-10-03 DIAGNOSIS — T84093A Other mechanical complication of internal left knee prosthesis, initial encounter: Secondary | ICD-10-CM | POA: Diagnosis not present

## 2016-10-03 DIAGNOSIS — R9431 Abnormal electrocardiogram [ECG] [EKG]: Secondary | ICD-10-CM | POA: Diagnosis not present

## 2016-10-03 DIAGNOSIS — X58XXXA Exposure to other specified factors, initial encounter: Secondary | ICD-10-CM | POA: Diagnosis not present

## 2016-10-03 DIAGNOSIS — Z01810 Encounter for preprocedural cardiovascular examination: Secondary | ICD-10-CM | POA: Diagnosis not present

## 2016-10-03 DIAGNOSIS — M1712 Unilateral primary osteoarthritis, left knee: Secondary | ICD-10-CM | POA: Diagnosis not present

## 2016-10-03 HISTORY — DX: Anxiety disorder, unspecified: F41.9

## 2016-10-03 HISTORY — DX: Gastro-esophageal reflux disease without esophagitis: K21.9

## 2016-10-03 HISTORY — DX: Headache, unspecified: R51.9

## 2016-10-03 HISTORY — DX: Unspecified osteoarthritis, unspecified site: M19.90

## 2016-10-03 HISTORY — DX: Personal history of urinary calculi: Z87.442

## 2016-10-03 HISTORY — DX: Pneumonia, unspecified organism: J18.9

## 2016-10-03 HISTORY — DX: Headache: R51

## 2016-10-03 LAB — BASIC METABOLIC PANEL
ANION GAP: 11 (ref 5–15)
BUN: 18 mg/dL (ref 6–20)
CALCIUM: 9.6 mg/dL (ref 8.9–10.3)
CO2: 28 mmol/L (ref 22–32)
Chloride: 100 mmol/L — ABNORMAL LOW (ref 101–111)
Creatinine, Ser: 0.83 mg/dL (ref 0.44–1.00)
GLUCOSE: 103 mg/dL — AB (ref 65–99)
Potassium: 4.1 mmol/L (ref 3.5–5.1)
SODIUM: 139 mmol/L (ref 135–145)

## 2016-10-03 LAB — CBC
HCT: 31.5 % — ABNORMAL LOW (ref 36.0–46.0)
Hemoglobin: 11.3 g/dL — ABNORMAL LOW (ref 12.0–15.0)
MCH: 27.8 pg (ref 26.0–34.0)
MCHC: 35.9 g/dL (ref 30.0–36.0)
MCV: 77.6 fL — ABNORMAL LOW (ref 78.0–100.0)
PLATELETS: 276 10*3/uL (ref 150–400)
RBC: 4.06 MIL/uL (ref 3.87–5.11)
RDW: 12.4 % (ref 11.5–15.5)
WBC: 7.6 10*3/uL (ref 4.0–10.5)

## 2016-10-03 LAB — SURGICAL PCR SCREEN
MRSA, PCR: NEGATIVE
STAPHYLOCOCCUS AUREUS: NEGATIVE

## 2016-10-03 LAB — ABO/RH: ABO/RH(D): B POS

## 2016-10-03 NOTE — Telephone Encounter (Signed)
Faxed

## 2016-10-03 NOTE — Telephone Encounter (Signed)
Done hardcopy to [place]  

## 2016-10-03 NOTE — Progress Notes (Signed)
Clearance Dr. Jenny Reichmann 09/08/16 on chart

## 2016-10-05 NOTE — H&P (Signed)
TOTAL KNEE CONVERSION ADMISSION H&P  Patient is being admitted for conversion from left UKR to TKA.  Subjective:  Chief Complaint: Failed left UKA  HPI: Pamela [name], 61 y.o. female, has a history of pain and functional disability in the left knee(s) due to failed previous arthroplasty and patient has failed non-surgical conservative treatments for greater than 12 weeks to include NSAID's and/or analgesics, corticosteriod injections, use of assistive devices and activity modification. 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 Onset of symptoms was gradual starting ~6 months ago with gradually worsening course since that time.  Prior procedures on the left knee(s) include unicompartmental arthroplasty.  Patient currently rates pain in the left knee(s) at 10 out of 10 with activity. There is night pain, worsening of pain with activity and weight bearing, pain that interferes with activities of daily living, pain with passive range of motion, crepitus and joint swelling.  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 This condition presents safety issues increasing the risk of falls.   There is no current active infection.  Risks, benefits and expectations were discussed with the patient.  Risks including but not limited to the risk of anesthesia, blood clots, nerve damage, blood vessel damage, failure of the prosthesis, infection and up to and including death.  Patient understand the risks, benefits and expectations and wishes to proceed with surgery.   PCP: Biagio Borg, MD  D/C Plans:       Home   Post-op Meds:       No Rx given  Tranexamic Acid:      To be given - IV  Decadron:      Is to be given  FYI:     ASA  [place]  DME:    Rx given for - RW and 3-n-1  PT:    OPPT  - Rx given   Patient Active Problem List   Diagnosis Date Noted  .  Hyperglycemia 09/23/2015  . Anxiety state 09/23/2015  . Abnormal urine odor 08/25/2015  . GERD (gastroesophageal reflux disease) 08/25/2015  . Upper abdominal pain 04/16/2015  . Early satiety 04/16/2015  . Dysuria 04/16/2015  . Loss of weight 04/16/2015  . Encounter for wellness examination 06/18/2014  . Greater trochanteric bursitis of right hip 01/06/2013  . Degenerative arthritis of left knee 09/10/2012  . Left lumbar radiculitis 04/03/2012  . Pain in knee region after total knee replacement ([place]) 04/03/2012  . Knee effusion, left 04/03/2012  . Eczema 04/03/2012  . 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 BURSITIS, RIGHT HIP 08/25/2009  . INSOMNIA-SLEEP DISORDER-UNSPEC 08/25/2009  . FATIGUE 08/25/2009  . Cervicalgia 08/14/2007  . DEPRESSION, SITUATIONAL 03/15/2007  . HYPERLIPIDEMIA 12/18/2006  . Essential hypertension 12/18/2006  . ALLERGIC RHINITIS 12/18/2006  . RENAL CALCULUS, HX OF 12/18/2006   Past Medical History:  Diagnosis Date  . ALLERGIC RHINITIS 12/18/2006   Qualifier: Diagnosis of  By: [place], [place]    . Anxiety   . Arthritis   . DEPRESSION, SITUATIONAL 03/15/2007   Qualifier: Diagnosis of  By: Wynona Luna   . GERD (gastroesophageal reflux disease)   . Headache    occasional  . History of kidney stones   . HYPERLIPIDEMIA 12/18/2006   Qualifier: History of  By: Danny Lawless CMA, [place]    . Hypertension   . HYPERTENSION 12/18/2006   Qualifier: Diagnosis of  By:  [place], [place]    . INSOMNIA-SLEEP DISORDER-UNSPEC 08/25/2009   Qualifier: Diagnosis of  By: Jenny Reichmann MD, [place]   . Pneumonia   . RENAL CALCULUS, HX OF 12/18/2006   Qualifier: Diagnosis of  By: [place], [place]      Past Surgical History:  Procedure Laterality Date  . CERVICAL DISC SURGERY     x 3  . KNEE ARTHROSCOPY     partial  . total left knee arthoplasty     10/09/16 Dr. Alvan Dame    No prescriptions prior to admission.   No Known Allergies  Social History  Substance Use Topics  . Smoking  status: Former Research scientist (life sciences)  . Smokeless tobacco: Never Used     Comment: 1970's  . Alcohol use No    Family History  Problem Relation Age of Onset  . Stroke Mother   . Heart disease Father   . Lung cancer Sister   . Lung cancer Brother      Review of Systems  Constitutional: Negative.   HENT: Negative.   Eyes: Negative.   Respiratory: Negative.   Cardiovascular: Negative.   Gastrointestinal: Positive for heartburn.  Genitourinary: Negative.   Musculoskeletal: Positive for joint pain.  Skin: Negative.   Neurological: Positive for headaches.  Endo/Heme/Allergies: Negative.   Psychiatric/Behavioral: The patient is nervous/anxious.      Objective:  Physical Exam  Constitutional: She is oriented to person, place, and time. She appears well-developed.  HENT:  Head: Normocephalic.  Eyes: Pupils are equal, round, and reactive to light.  Neck: Neck supple. No JVD present. No tracheal deviation present. No thyromegaly present.  Cardiovascular: Normal rate, regular rhythm and intact distal pulses.   Respiratory: Effort normal and breath sounds normal. No respiratory distress. She has no wheezes.  GI: Soft. There is no tenderness. There is no guarding.  Musculoskeletal:       Left knee: She exhibits decreased range of motion, swelling and bony tenderness. She exhibits no ecchymosis, no deformity, no laceration (healed previous incision) and no erythema. Tenderness found.  Lymphadenopathy:    She has no cervical adenopathy.  Neurological: She is alert and oriented to person, place, and time.  Skin: Skin is warm and dry.  Psychiatric: She has a normal mood and affect.      Labs:  Estimated body mass index is 30.27 kg/m as calculated from the following:   Height as of 10/03/16: 5\' 9"  (1.753 m).   Weight as of 10/03/16: 93 kg (205 lb).  Imaging Review Plain radiographs demonstrate severe degenerative joint disease of the left knee(s). The overall alignment is neutral.There is  evidence of progressive OA changes in the lateral and PF compartments The bone quality appears to be good for age and reported activity level. There is previous medial compartment UKA.  Assessment/Plan:  End stage arthritis, left knee(s) with failed previous medial UKA.   The patient history, physical examination, clinical judgment of the provider and imaging studies are consistent with end stage degenerative joint disease of the left knee(s), previous total knee arthroplasty. Revision total knee arthroplasty is deemed medically necessary. The treatment options including medical management, injection therapy, arthroscopy and revision arthroplasty were discussed at length. The risks and benefits of revision total knee arthroplasty were presented and reviewed. The risks due to aseptic loosening, infection, stiffness, patella tracking problems, thromboembolic complications and other imponderables were discussed. The patient acknowledged the explanation, agreed to proceed with the plan and consent was signed. Patient is being admitted for inpatient treatment  for surgery, pain control, PT, OT, prophylactic antibiotics, VTE prophylaxis, progressive ambulation and ADL's and discharge planning.The patient is planning to be discharged home.     [place] Pugh Shaymus Eveleth   PA-C  10/05/2016, 11:01 AM

## 2016-10-09 ENCOUNTER — Inpatient Hospital Stay (HOSPITAL_COMMUNITY): Payer: Medicare HMO | Admitting: Anesthesiology

## 2016-10-09 ENCOUNTER — Encounter (HOSPITAL_COMMUNITY): Payer: Self-pay | Admitting: *Deleted

## 2016-10-09 ENCOUNTER — Encounter (HOSPITAL_COMMUNITY)
Admission: RE | Disposition: A | Discharge status: Home / Self Care | Payer: Self-pay | Source: Ambulatory Visit | Attending: Orthopedic Surgery

## 2016-10-09 ENCOUNTER — Inpatient Hospital Stay (HOSPITAL_COMMUNITY)
Admission: RE | Admit: 2016-10-09 | Discharge: 2016-10-13 | DRG: 468 | Disposition: A | Discharge status: Home / Self Care | Payer: Medicare HMO | Source: Ambulatory Visit | Attending: Orthopedic Surgery | Admitting: Orthopedic Surgery

## 2016-10-09 DIAGNOSIS — Z9181 History of falling: Secondary | ICD-10-CM | POA: Diagnosis not present

## 2016-10-09 DIAGNOSIS — Z96652 Presence of left artificial knee joint: Secondary | ICD-10-CM

## 2016-10-09 DIAGNOSIS — M1712 Unilateral primary osteoarthritis, left knee: Secondary | ICD-10-CM | POA: Diagnosis not present

## 2016-10-09 DIAGNOSIS — T8489XA Other specified complication of internal orthopedic prosthetic devices, implants and grafts, initial encounter: Principal | ICD-10-CM | POA: Diagnosis present

## 2016-10-09 DIAGNOSIS — Z87891 Personal history of nicotine dependence: Secondary | ICD-10-CM

## 2016-10-09 DIAGNOSIS — Z801 Family history of malignant neoplasm of trachea, bronchus and lung: Secondary | ICD-10-CM

## 2016-10-09 DIAGNOSIS — F411 Generalized anxiety disorder: Secondary | ICD-10-CM | POA: Diagnosis not present

## 2016-10-09 DIAGNOSIS — E785 Hyperlipidemia, unspecified: Secondary | ICD-10-CM | POA: Diagnosis not present

## 2016-10-09 DIAGNOSIS — I10 Essential (primary) hypertension: Secondary | ICD-10-CM | POA: Diagnosis present

## 2016-10-09 DIAGNOSIS — Z8249 Family history of ischemic heart disease and other diseases of the circulatory system: Secondary | ICD-10-CM

## 2016-10-09 DIAGNOSIS — Y92009 Unspecified place in unspecified non-institutional (private) residence as the place of occurrence of the external cause: Secondary | ICD-10-CM | POA: Diagnosis not present

## 2016-10-09 DIAGNOSIS — M21372 Foot drop, left foot: Secondary | ICD-10-CM | POA: Diagnosis not present

## 2016-10-09 DIAGNOSIS — G8918 Other acute postprocedural pain: Secondary | ICD-10-CM | POA: Diagnosis not present

## 2016-10-09 DIAGNOSIS — T84093A Other mechanical complication of internal left knee prosthesis, initial encounter: Secondary | ICD-10-CM | POA: Diagnosis not present

## 2016-10-09 HISTORY — PX: CONVERSION TO TOTAL KNEE: SHX5785

## 2016-10-09 LAB — TYPE AND SCREEN
ABO/RH(D): B POS
ANTIBODY SCREEN: NEGATIVE

## 2016-10-09 SURGERY — CONVERSION, ARTHROPLASTY, KNEE, PARTIAL, TO TOTAL KNEE ARTHROPLASTY
Anesthesia: General | Laterality: Left

## 2016-10-09 MED ORDER — DEXAMETHASONE SODIUM PHOSPHATE 10 MG/ML IJ SOLN
10.0000 mg | Freq: Once | INTRAMUSCULAR | Status: AC
  Administered 2016-10-10: 10 mg via INTRAVENOUS
  Filled 2016-10-09: qty 1

## 2016-10-09 MED ORDER — MIDAZOLAM HCL 2 MG/2ML IJ SOLN
2.0000 mg | Freq: Once | INTRAMUSCULAR | Status: AC
  Administered 2016-10-09: 2 mg via INTRAVENOUS

## 2016-10-09 MED ORDER — ZOLPIDEM TARTRATE 5 MG PO TABS
5.0000 mg | ORAL_TABLET | Freq: Every day | ORAL | Status: DC
  Administered 2016-10-10 – 2016-10-12 (×4): 5 mg via ORAL
  Filled 2016-10-09 (×4): qty 1

## 2016-10-09 MED ORDER — PROPOFOL 10 MG/ML IV BOLUS
INTRAVENOUS | Status: AC
  Filled 2016-10-09: qty 20

## 2016-10-09 MED ORDER — KETOROLAC TROMETHAMINE 30 MG/ML IJ SOLN
INTRAMUSCULAR | Status: AC
  Filled 2016-10-09: qty 1

## 2016-10-09 MED ORDER — FENTANYL CITRATE (PF) 100 MCG/2ML IJ SOLN
25.0000 ug | INTRAMUSCULAR | Status: DC | PRN
  Administered 2016-10-09 (×3): 50 ug via INTRAVENOUS

## 2016-10-09 MED ORDER — MIDAZOLAM HCL 5 MG/5ML IJ SOLN
INTRAMUSCULAR | Status: DC | PRN
  Administered 2016-10-09 (×2): 1 mg via INTRAVENOUS

## 2016-10-09 MED ORDER — BUPIVACAINE-EPINEPHRINE (PF) 0.5% -1:200000 IJ SOLN
INTRAMUSCULAR | Status: AC
  Filled 2016-10-09: qty 30

## 2016-10-09 MED ORDER — DIPHENHYDRAMINE HCL 25 MG PO CAPS: 25.0000 mg | ORAL_CAPSULE | Freq: Four times a day (QID) | ORAL | Status: DC | PRN

## 2016-10-09 MED ORDER — HYDROMORPHONE HCL-NACL 0.5-0.9 MG/ML-% IV SOSY
PREFILLED_SYRINGE | INTRAVENOUS | Status: AC
  Administered 2016-10-09: 0.5 mg
  Filled 2016-10-09: qty 2

## 2016-10-09 MED ORDER — FENTANYL CITRATE (PF) 100 MCG/2ML IJ SOLN
100.0000 ug | Freq: Once | INTRAMUSCULAR | Status: AC
  Administered 2016-10-09: 100 ug via INTRAVENOUS

## 2016-10-09 MED ORDER — ONDANSETRON HCL 4 MG/2ML IJ SOLN: 4.0000 mg | Freq: Four times a day (QID) | INTRAMUSCULAR | Status: DC | PRN

## 2016-10-09 MED ORDER — DEXAMETHASONE SODIUM PHOSPHATE 10 MG/ML IJ SOLN
INTRAMUSCULAR | Status: AC
  Filled 2016-10-09: qty 1

## 2016-10-09 MED ORDER — METOCLOPRAMIDE HCL 10 MG PO TABS
10.0000 mg | ORAL_TABLET | Freq: Every day | ORAL | Status: DC
  Administered 2016-10-09 – 2016-10-12 (×4): 10 mg via ORAL
  Filled 2016-10-09 (×4): qty 1

## 2016-10-09 MED ORDER — METHOCARBAMOL 500 MG PO TABS
500.0000 mg | ORAL_TABLET | Freq: Four times a day (QID) | ORAL | Status: DC | PRN
  Administered 2016-10-09 – 2016-10-13 (×11): 500 mg via ORAL
  Filled 2016-10-09 (×12): qty 1

## 2016-10-09 MED ORDER — LOSARTAN POTASSIUM 50 MG PO TABS
50.0000 mg | ORAL_TABLET | Freq: Every day | ORAL | Status: DC
  Administered 2016-10-10 – 2016-10-13 (×4): 50 mg via ORAL
  Filled 2016-10-09 (×4): qty 1

## 2016-10-09 MED ORDER — HYDROMORPHONE HCL-NACL 0.5-0.9 MG/ML-% IV SOSY
1.0000 mg | PREFILLED_SYRINGE | INTRAVENOUS | Status: DC | PRN
  Administered 2016-10-09 – 2016-10-11 (×6): 1 mg via INTRAVENOUS
  Filled 2016-10-09 (×6): qty 2

## 2016-10-09 MED ORDER — BUPIVACAINE-EPINEPHRINE (PF) 0.5% -1:200000 IJ SOLN
INTRAMUSCULAR | Status: DC | PRN
  Administered 2016-10-09: 30 mL via PERINEURAL

## 2016-10-09 MED ORDER — FENTANYL CITRATE (PF) 100 MCG/2ML IJ SOLN
INTRAMUSCULAR | Status: DC | PRN
  Administered 2016-10-09 (×7): 50 ug via INTRAVENOUS

## 2016-10-09 MED ORDER — ATORVASTATIN CALCIUM 20 MG PO TABS
20.0000 mg | ORAL_TABLET | Freq: Every day | ORAL | Status: DC
  Administered 2016-10-09 – 2016-10-12 (×4): 20 mg via ORAL
  Filled 2016-10-09 (×4): qty 1

## 2016-10-09 MED ORDER — PHENOL 1.4 % MT LIQD: 1.0000 | OROMUCOSAL | Status: DC | PRN

## 2016-10-09 MED ORDER — MIDAZOLAM HCL 2 MG/2ML IJ SOLN
INTRAMUSCULAR | Status: AC
  Filled 2016-10-09: qty 2

## 2016-10-09 MED ORDER — HYDROMORPHONE HCL 2 MG PO TABS
2.0000 mg | ORAL_TABLET | Freq: Four times a day (QID) | ORAL | Status: DC | PRN
  Administered 2016-10-10 – 2016-10-11 (×2): 4 mg via ORAL
  Filled 2016-10-09 (×2): qty 2

## 2016-10-09 MED ORDER — FENTANYL CITRATE (PF) 100 MCG/2ML IJ SOLN
INTRAMUSCULAR | Status: AC
  Filled 2016-10-09: qty 4

## 2016-10-09 MED ORDER — DEXAMETHASONE SODIUM PHOSPHATE 10 MG/ML IJ SOLN
10.0000 mg | Freq: Once | INTRAMUSCULAR | Status: AC
  Administered 2016-10-09: 10 mg via INTRAVENOUS

## 2016-10-09 MED ORDER — METOCLOPRAMIDE HCL 5 MG PO TABS: 5.0000 mg | ORAL_TABLET | Freq: Three times a day (TID) | ORAL | Status: DC | PRN

## 2016-10-09 MED ORDER — MIDAZOLAM HCL 5 MG/ML IJ SOLN
2.0000 mg | Freq: Once | INTRAMUSCULAR | Status: AC
  Administered 2016-10-09: 2 mg via INTRAVENOUS

## 2016-10-09 MED ORDER — LACTATED RINGERS IV SOLN: INTRAVENOUS | Status: DC

## 2016-10-09 MED ORDER — ALUM & MAG HYDROXIDE-SIMETH 200-200-20 MG/5ML PO SUSP: 15.0000 mL | ORAL | Status: DC | PRN

## 2016-10-09 MED ORDER — CEFAZOLIN SODIUM-DEXTROSE 2-4 GM/100ML-% IV SOLN
2.0000 g | INTRAVENOUS | Status: AC
  Administered 2016-10-09: 2 g via INTRAVENOUS

## 2016-10-09 MED ORDER — 0.9 % SODIUM CHLORIDE (POUR BTL) OPTIME
TOPICAL | Status: DC | PRN
  Administered 2016-10-09: 1000 mL

## 2016-10-09 MED ORDER — LOSARTAN POTASSIUM-HCTZ 50-12.5 MG PO TABS: 1.0000 | ORAL_TABLET | Freq: Every day | ORAL | Status: DC

## 2016-10-09 MED ORDER — LABETALOL HCL 5 MG/ML IV SOLN
INTRAVENOUS | Status: DC | PRN
  Administered 2016-10-09: 5 mg via INTRAVENOUS

## 2016-10-09 MED ORDER — ONDANSETRON HCL 4 MG/2ML IJ SOLN
INTRAMUSCULAR | Status: DC | PRN
  Administered 2016-10-09: 4 mg via INTRAVENOUS

## 2016-10-09 MED ORDER — KETOROLAC TROMETHAMINE 30 MG/ML IJ SOLN
INTRAMUSCULAR | Status: DC | PRN
Start: 2016-10-09 — End: 2016-10-09
  Administered 2016-10-09: 30 mg via INTRAVENOUS

## 2016-10-09 MED ORDER — FENTANYL CITRATE (PF) 100 MCG/2ML IJ SOLN
INTRAMUSCULAR | Status: AC
  Filled 2016-10-09: qty 2

## 2016-10-09 MED ORDER — PROPOFOL 10 MG/ML IV BOLUS
INTRAVENOUS | Status: DC | PRN
  Administered 2016-10-09: 200 mg via INTRAVENOUS

## 2016-10-09 MED ORDER — SODIUM CHLORIDE 0.9 % IJ SOLN
INTRAMUSCULAR | Status: AC
  Filled 2016-10-09: qty 50

## 2016-10-09 MED ORDER — FENTANYL CITRATE (PF) 250 MCG/5ML IJ SOLN
INTRAMUSCULAR | Status: AC
  Filled 2016-10-09: qty 5

## 2016-10-09 MED ORDER — FENTANYL CITRATE (PF) 100 MCG/2ML IJ SOLN
INTRAMUSCULAR | Status: AC
  Administered 2016-10-09: 100 ug via INTRAVENOUS
  Filled 2016-10-09: qty 2

## 2016-10-09 MED ORDER — ONDANSETRON HCL 4 MG PO TABS: 4.0000 mg | ORAL_TABLET | Freq: Four times a day (QID) | ORAL | Status: DC | PRN

## 2016-10-09 MED ORDER — MEPERIDINE HCL 50 MG/ML IJ SOLN: 6.2500 mg | INTRAMUSCULAR | Status: DC | PRN

## 2016-10-09 MED ORDER — DOCUSATE SODIUM 100 MG PO CAPS
100.0000 mg | ORAL_CAPSULE | Freq: Two times a day (BID) | ORAL | Status: DC
  Administered 2016-10-09 – 2016-10-13 (×8): 100 mg via ORAL
  Filled 2016-10-09 (×8): qty 1

## 2016-10-09 MED ORDER — TRANEXAMIC ACID 1000 MG/10ML IV SOLN
1000.0000 mg | Freq: Once | INTRAVENOUS | Status: AC
  Administered 2016-10-09: 18:00:00 1000 mg via INTRAVENOUS
  Filled 2016-10-09: qty 10

## 2016-10-09 MED ORDER — LIDOCAINE 2% (20 MG/ML) 5 ML SYRINGE
INTRAMUSCULAR | Status: AC
  Filled 2016-10-09: qty 5

## 2016-10-09 MED ORDER — FENTANYL CITRATE (PF) 100 MCG/2ML IJ SOLN
25.0000 ug | INTRAMUSCULAR | Status: DC | PRN
  Administered 2016-10-09: 50 ug via INTRAVENOUS
  Filled 2016-10-09: qty 2

## 2016-10-09 MED ORDER — METOCLOPRAMIDE HCL 5 MG/ML IJ SOLN: 5.0000 mg | Freq: Three times a day (TID) | INTRAMUSCULAR | Status: DC | PRN

## 2016-10-09 MED ORDER — HYDROCHLOROTHIAZIDE 12.5 MG PO CAPS
12.5000 mg | ORAL_CAPSULE | Freq: Every day | ORAL | Status: DC
  Administered 2016-10-10 – 2016-10-13 (×4): 12.5 mg via ORAL
  Filled 2016-10-09 (×4): qty 1

## 2016-10-09 MED ORDER — CHLORHEXIDINE GLUCONATE 4 % EX LIQD: 60.0000 mL | Freq: Once | CUTANEOUS | Status: DC

## 2016-10-09 MED ORDER — LABETALOL HCL 5 MG/ML IV SOLN
INTRAVENOUS | Status: AC
  Filled 2016-10-09: qty 4

## 2016-10-09 MED ORDER — LIDOCAINE HCL (CARDIAC) 20 MG/ML IV SOLN
INTRAVENOUS | Status: DC | PRN
  Administered 2016-10-09: 60 mg via INTRAVENOUS

## 2016-10-09 MED ORDER — POLYETHYLENE GLYCOL 3350 17 G PO PACK
17.0000 g | PACK | Freq: Two times a day (BID) | ORAL | Status: DC
  Administered 2016-10-09 – 2016-10-13 (×5): 17 g via ORAL
  Filled 2016-10-09 (×6): qty 1

## 2016-10-09 MED ORDER — LACTATED RINGERS IV SOLN
INTRAVENOUS | Status: DC | PRN
  Administered 2016-10-09 (×2): via INTRAVENOUS

## 2016-10-09 MED ORDER — MIDAZOLAM HCL 2 MG/2ML IJ SOLN
INTRAMUSCULAR | Status: AC
  Administered 2016-10-09: 2 mg via INTRAVENOUS
  Filled 2016-10-09: qty 2

## 2016-10-09 MED ORDER — ASPIRIN 81 MG PO CHEW
81.0000 mg | CHEWABLE_TABLET | Freq: Two times a day (BID) | ORAL | Status: DC
  Administered 2016-10-09 – 2016-10-13 (×8): 81 mg via ORAL
  Filled 2016-10-09 (×8): qty 1

## 2016-10-09 MED ORDER — CELECOXIB 200 MG PO CAPS
200.0000 mg | ORAL_CAPSULE | Freq: Two times a day (BID) | ORAL | Status: DC
  Administered 2016-10-09 – 2016-10-13 (×8): 200 mg via ORAL
  Filled 2016-10-09 (×8): qty 1

## 2016-10-09 MED ORDER — BUPIVACAINE-EPINEPHRINE (PF) 0.25% -1:200000 IJ SOLN
INTRAMUSCULAR | Status: DC | PRN
  Administered 2016-10-09: 30 mL via PERINEURAL

## 2016-10-09 MED ORDER — SODIUM CHLORIDE 0.9 % IV SOLN
INTRAVENOUS | Status: DC
  Administered 2016-10-09 – 2016-10-10 (×2): via INTRAVENOUS

## 2016-10-09 MED ORDER — MAGNESIUM CITRATE PO SOLN: 1.0000 | Freq: Once | ORAL | Status: DC | PRN

## 2016-10-09 MED ORDER — TRANEXAMIC ACID 1000 MG/10ML IV SOLN
1000.0000 mg | INTRAVENOUS | Status: AC
  Administered 2016-10-09: 1000 mg via INTRAVENOUS
  Filled 2016-10-09: qty 1100

## 2016-10-09 MED ORDER — FERROUS SULFATE 325 (65 FE) MG PO TABS
325.0000 mg | ORAL_TABLET | Freq: Three times a day (TID) | ORAL | Status: DC
  Administered 2016-10-10 – 2016-10-13 (×7): 325 mg via ORAL
  Filled 2016-10-09 (×8): qty 1

## 2016-10-09 MED ORDER — CEFAZOLIN SODIUM-DEXTROSE 2-4 GM/100ML-% IV SOLN
2.0000 g | Freq: Four times a day (QID) | INTRAVENOUS | Status: AC
  Administered 2016-10-09 – 2016-10-10 (×2): 2 g via INTRAVENOUS
  Filled 2016-10-09 (×2): qty 100

## 2016-10-09 MED ORDER — CEFAZOLIN SODIUM-DEXTROSE 2-4 GM/100ML-% IV SOLN
INTRAVENOUS | Status: AC
  Filled 2016-10-09: qty 100

## 2016-10-09 MED ORDER — MENTHOL 3 MG MT LOZG: 1.0000 | LOZENGE | OROMUCOSAL | Status: DC | PRN

## 2016-10-09 MED ORDER — SODIUM CHLORIDE 0.9 % IV SOLN
INTRAVENOUS | Status: AC | PRN
  Administered 2016-10-09: 50 mL via INTRAMUSCULAR

## 2016-10-09 MED ORDER — ALPRAZOLAM 0.25 MG PO TABS
0.2500 mg | ORAL_TABLET | Freq: Two times a day (BID) | ORAL | Status: DC | PRN
  Administered 2016-10-09 – 2016-10-11 (×2): 0.25 mg via ORAL
  Filled 2016-10-09 (×2): qty 1

## 2016-10-09 MED ORDER — DEXTROSE 5 % IV SOLN
500.0000 mg | Freq: Four times a day (QID) | INTRAVENOUS | Status: DC | PRN
  Administered 2016-10-09: 500 mg via INTRAVENOUS
  Filled 2016-10-09: qty 550

## 2016-10-09 MED ORDER — HYDROCODONE-ACETAMINOPHEN 7.5-325 MG PO TABS
1.0000 | ORAL_TABLET | ORAL | Status: DC
  Administered 2016-10-09 (×2): 1 via ORAL
  Administered 2016-10-09 – 2016-10-11 (×8): 2 via ORAL
  Filled 2016-10-09 (×2): qty 2
  Filled 2016-10-09: qty 1
  Filled 2016-10-09 (×3): qty 2
  Filled 2016-10-09: qty 1
  Filled 2016-10-09 (×3): qty 2

## 2016-10-09 MED ORDER — BISACODYL 10 MG RE SUPP: 10.0000 mg | Freq: Every day | RECTAL | Status: DC | PRN

## 2016-10-09 MED ORDER — ONDANSETRON HCL 4 MG/2ML IJ SOLN
INTRAMUSCULAR | Status: AC
  Filled 2016-10-09: qty 2

## 2016-10-09 MED ORDER — HYDROMORPHONE HCL-NACL 0.5-0.9 MG/ML-% IV SOSY
0.2500 mg | PREFILLED_SYRINGE | INTRAVENOUS | Status: DC | PRN
  Administered 2016-10-09: 0.5 mg via INTRAVENOUS

## 2016-10-09 MED ORDER — METOCLOPRAMIDE HCL 5 MG/ML IJ SOLN: 10.0000 mg | Freq: Once | INTRAMUSCULAR | Status: DC | PRN

## 2016-10-09 SURGICAL SUPPLY — 65 items
ADH SKN CLS APL DERMABOND .7 (GAUZE/BANDAGES/DRESSINGS) ×1
AUG TIB SZ2.5 5 REV STP WDG (Knees) ×1 IMPLANT
BAG SPEC THK2 15X12 ZIP CLS (MISCELLANEOUS) ×1
BAG ZIPLOCK 12X15 (MISCELLANEOUS) ×3 IMPLANT
BANDAGE ACE 6X5 VEL STRL LF (GAUZE/BANDAGES/DRESSINGS) ×3 IMPLANT
BANDAGE ESMARK 6X9 LF (GAUZE/BANDAGES/DRESSINGS) ×1 IMPLANT
BLADE SAW SGTL 11.0X1.19X90.0M (BLADE) IMPLANT
BLADE SAW SGTL 13.0X1.19X90.0M (BLADE) ×3 IMPLANT
BNDG CMPR 9X6 STRL LF SNTH (GAUZE/BANDAGES/DRESSINGS) ×1
BNDG ESMARK 6X9 LF (GAUZE/BANDAGES/DRESSINGS) ×3
BOWL SMART MIX CTS (DISPOSABLE) ×3 IMPLANT
CAP KNEE TOTAL 3 SIGMA ×2 IMPLANT
CEMENT HV SMART SET (Cement) ×4 IMPLANT
COVER SURGICAL LIGHT HANDLE (MISCELLANEOUS) ×3 IMPLANT
CUFF TOURN SGL QUICK 34 (TOURNIQUET CUFF) ×3
CUFF TRNQT CYL 34X4X40X1 (TOURNIQUET CUFF) ×1 IMPLANT
DECANTER SPIKE VIAL GLASS SM (MISCELLANEOUS) ×3 IMPLANT
DERMABOND ADVANCED (GAUZE/BANDAGES/DRESSINGS) ×2
DERMABOND ADVANCED .7 DNX12 (GAUZE/BANDAGES/DRESSINGS) ×1 IMPLANT
DRAPE EXTREMITY T 121X128X90 (DRAPE) ×3 IMPLANT
DRAPE POUCH INSTRU U-SHP 10X18 (DRAPES) ×3 IMPLANT
DRAPE U-SHAPE 47X51 STRL (DRAPES) ×3 IMPLANT
DRESSING AQUACEL AG SP 3.5X10 (GAUZE/BANDAGES/DRESSINGS) IMPLANT
DRSG AQUACEL AG ADV 3.5X10 (GAUZE/BANDAGES/DRESSINGS) ×3 IMPLANT
DRSG AQUACEL AG SP 3.5X10 (GAUZE/BANDAGES/DRESSINGS)
DRSG TEGADERM 4X4.75 (GAUZE/BANDAGES/DRESSINGS) ×3 IMPLANT
DURAPREP 26ML APPLICATOR (WOUND CARE) ×6 IMPLANT
ELECT REM PT RETURN 15FT ADLT (MISCELLANEOUS) ×3 IMPLANT
FACESHIELD WRAPAROUND (MASK) ×15 IMPLANT
FACESHIELD WRAPAROUND OR TEAM (MASK) ×5 IMPLANT
GAUZE SPONGE 2X2 8PLY STRL LF (GAUZE/BANDAGES/DRESSINGS) ×1 IMPLANT
GLOVE BIOGEL M 7.0 STRL (GLOVE) ×4 IMPLANT
GLOVE BIOGEL PI IND STRL 7.5 (GLOVE) ×1 IMPLANT
GLOVE BIOGEL PI IND STRL 8.5 (GLOVE) ×1 IMPLANT
GLOVE BIOGEL PI INDICATOR 7.5 (GLOVE) ×2
GLOVE BIOGEL PI INDICATOR 8.5 (GLOVE) ×2
GLOVE ECLIPSE 8.0 STRL XLNG CF (GLOVE) ×3 IMPLANT
GLOVE ORTHO TXT STRL SZ7.5 (GLOVE) ×6 IMPLANT
GOWN STRL REUS W/TWL LRG LVL3 (GOWN DISPOSABLE) ×3 IMPLANT
GOWN STRL REUS W/TWL XL LVL3 (GOWN DISPOSABLE) ×3 IMPLANT
HANDPIECE INTERPULSE COAX TIP (DISPOSABLE) ×3
MANIFOLD NEPTUNE II (INSTRUMENTS) ×3 IMPLANT
NDL SAFETY ECLIPSE 18X1.5 (NEEDLE) ×1 IMPLANT
NEEDLE HYPO 18GX1.5 SHARP (NEEDLE) ×3
NS IRRIG 1000ML POUR BTL (IV SOLUTION) ×6 IMPLANT
POSITIONER SURGICAL ARM (MISCELLANEOUS) ×3 IMPLANT
SET HNDPC FAN SPRY TIP SCT (DISPOSABLE) ×1 IMPLANT
SET PAD KNEE POSITIONER (MISCELLANEOUS) ×3 IMPLANT
SPONGE GAUZE 2X2 STER 10/PKG (GAUZE/BANDAGES/DRESSINGS) ×2
SUCTION FRAZIER HANDLE 12FR (TUBING) ×2
SUCTION TUBE FRAZIER 12FR DISP (TUBING) ×1 IMPLANT
SUT MNCRL AB 4-0 PS2 18 (SUTURE) ×3 IMPLANT
SUT VIC AB 1 CT1 36 (SUTURE) ×3 IMPLANT
SUT VIC AB 2-0 CT1 27 (SUTURE) ×9
SUT VIC AB 2-0 CT1 TAPERPNT 27 (SUTURE) ×3 IMPLANT
SUT VLOC 180 0 24IN GS25 (SUTURE) ×3 IMPLANT
SYR 50ML LL SCALE MARK (SYRINGE) ×3 IMPLANT
TOWEL OR 17X26 10 PK STRL BLUE (TOWEL DISPOSABLE) ×6 IMPLANT
TRAY FOLEY W/METER SILVER 16FR (SET/KITS/TRAYS/PACK) ×3 IMPLANT
TRAY REVISION SZ 3 (Knees) ×2 IMPLANT
TRAY SLEEVE CEM ML (Knees) ×2 IMPLANT
WATER STERILE IRR 1500ML POUR (IV SOLUTION) ×3 IMPLANT
WEDGE STEP SZ.5 5MM (Knees) ×2 IMPLANT
WRAP KNEE MAXI GEL POST OP (GAUZE/BANDAGES/DRESSINGS) ×3 IMPLANT
YANKAUER SUCT BULB TIP 10FT TU (MISCELLANEOUS) ×2 IMPLANT

## 2016-10-09 NOTE — Discharge Instructions (Signed)

## 2016-10-09 NOTE — Progress Notes (Signed)
AssistedDr. Carignan with left, ultrasound guided, adductor canal block. Side rails up, monitors on throughout procedure. See vital signs in flow sheet. Tolerated Procedure well.  

## 2016-10-09 NOTE — Anesthesia Procedure Notes (Signed)
Procedure Name: LMA Insertion Date/Time: 10/09/2016 2:12 PM Performed by: Glory Buff Pre-anesthesia Checklist: Patient identified, Emergency Drugs available, Suction available and Patient being monitored Patient Re-evaluated:Patient Re-evaluated prior to induction Oxygen Delivery Method: Circle system utilized Preoxygenation: Pre-oxygenation with 100% oxygen Induction Type: IV induction LMA: LMA inserted LMA Size: 4.0 Number of attempts: 1 Placement Confirmation: positive ETCO2 Tube secured with: Tape Dental Injury: Teeth and Oropharynx as per pre-operative assessment

## 2016-10-09 NOTE — Anesthesia Procedure Notes (Signed)
Anesthesia Regional Block: Adductor canal block   Pre-Anesthetic Checklist: ,, timeout performed, Correct Patient, Correct Site, Correct Laterality, Correct Procedure, Correct Position, site marked, Risks and benefits discussed,  Surgical consent,  Pre-op evaluation,  At surgeon's request and post-op pain management  Laterality: Left and Lower  Prep: Maximum Sterile Barrier Precautions used, chloraprep       Needles:  Injection technique: Single-shot  Needle Type: Echogenic Stimulator Needle     Needle Length: 10cm      Additional Needles:   Procedures: ultrasound guided,,,,,,,,  Narrative:  Start time: 10/09/2016 12:09 PM End time: 10/09/2016 12:14 PM Injection made incrementally with aspirations every 5 mL.  Performed by: Personally  Anesthesiologist: Montez Hageman  Additional Notes: Risks, benefits and alternative to block explained extensively.  Patient tolerated procedure well, without complications.

## 2016-10-09 NOTE — Anesthesia Postprocedure Evaluation (Signed)
Anesthesia Post Note  Patient: Pamela [name]  Procedure(s) Performed: Procedure(s) (LRB): Conversion left uni compartment arthroplasty to total knee arthroplasty (Left)     Patient location during evaluation: PACU Anesthesia Type: General Level of consciousness: awake and alert Pain management: pain level controlled Vital Signs Assessment: post-procedure vital signs reviewed and stable Respiratory status: spontaneous breathing, nonlabored ventilation, respiratory function stable and patient connected to nasal cannula oxygen Cardiovascular status: blood pressure returned to baseline and stable Postop Assessment: no signs of nausea or vomiting Anesthetic complications: no    Last Vitals:  Vitals:   10/09/16 1720 10/09/16 1730  BP:  (!) 144/82  Pulse: 91 87  Resp: (!) 21 14  Temp:  36.9 C    Last Pain:  Vitals:   10/09/16 1720  TempSrc:   PainSc: Asleep                 Montez Hageman

## 2016-10-09 NOTE — Progress Notes (Signed)
Pt c/o 10/10 pain to left knee unrelieved by pain medications. Notified on-call physician for [place]. Received a call back from [facility]. New orders to d/c IV fentanyl and add IV dilaudid 1mg  Q2hrs PRN for severe pain and PO dilaudid 2-4mg  Q6hrs PRN for severe pain. Okay to keep orders for PO vicodin.

## 2016-10-09 NOTE — Anesthesia Preprocedure Evaluation (Signed)
Anesthesia Evaluation  Patient identified by MRN, date of birth, ID band Patient awake    Reviewed: Allergy & Precautions, NPO status , Patient's Chart, lab work & pertinent test results  Airway Mallampati: II  TM Distance: >3 FB Neck ROM: Full    Dental no notable dental hx.    Pulmonary former smoker,    Pulmonary exam normal breath sounds clear to auscultation       Cardiovascular hypertension, Pt. on medications Normal cardiovascular exam Rhythm:Regular Rate:Normal     Neuro/Psych Anxiety negative neurological ROS     GI/Hepatic negative GI ROS, Neg liver ROS,   Endo/Other  negative endocrine ROS  Renal/GU negative Renal ROS  negative genitourinary   Musculoskeletal negative musculoskeletal ROS (+)   Abdominal   Peds negative pediatric ROS (+)  Hematology negative hematology ROS (+)   Anesthesia Other Findings   Reproductive/Obstetrics negative OB ROS                             Anesthesia Physical Anesthesia Plan  ASA: II  Anesthesia Plan: General   Post-op Pain Management: GA combined w/ Regional for post-op pain   Induction: Intravenous  PONV Risk Score and Plan: 3 and Ondansetron, Dexamethasone and Midazolam  Airway Management Planned: Oral ETT  Additional Equipment:   Intra-op Plan:   Post-operative Plan: Extubation in OR  Informed Consent: I have reviewed the patients History and Physical, chart, labs and discussed the procedure including the risks, benefits and alternatives for the proposed anesthesia with the patient or authorized representative who has indicated his/her understanding and acceptance.   Dental advisory given  Plan Discussed with: CRNA  Anesthesia Plan Comments: (Extreme anxiety. Not a candidate for SAB. Refuses SAB in fact.  GA plus adductor)        Anesthesia Quick Evaluation

## 2016-10-09 NOTE — Brief Op Note (Signed)
10/09/2016  3:52 PM  PATIENT:  Pamela [name]  61 y.o. female  PRE-OPERATIVE DIAGNOSIS:  Failed left uni compartmental arthroplasty  POST-OPERATIVE DIAGNOSIS:   Failed left uni compartmental arthroplasty   PROCEDURE:  Procedure(s) with comments: Conversion left uni compartment arthroplasty to total knee arthroplasty (Left) - 90 mins -[place]  SURGEON:  Surgeon(s) and Role:    Paralee Cancel, MD - Primary  PHYSICIAN ASSISTANT: Danae Orleans, PA-C  ANESTHESIA:   regional and general  EBL:  Total I/O In: 1000 [I.V.:1000] Out: -   BLOOD ADMINISTERED:none  DRAINS: none   LOCAL MEDICATIONS USED:  MARCAINE     SPECIMEN:  No Specimen  DISPOSITION OF SPECIMEN:  N/A  COUNTS:  YES  TOURNIQUET:  49 min at 250 mmHg  DICTATION: .Other Dictation: Dictation Number [id number]  PLAN OF CARE: Admit to inpatient   PATIENT DISPOSITION:  PACU - hemodynamically stable.   Delay start of Pharmacological VTE agent (>24hrs) due to surgical blood loss or risk of bleeding: no

## 2016-10-09 NOTE — Transfer of Care (Signed)
Immediate Anesthesia Transfer of Care Note  Patient: Pamela [name]  Procedure(s) Performed: Procedure(s) with comments: Conversion left uni compartment arthroplasty to total knee arthroplasty (Left) - 90 mins  Patient Location: PACU  Anesthesia Type:General  Level of Consciousness: awake, alert  and oriented  Airway & Oxygen Therapy: Patient Spontanous Breathing and Patient connected to face mask oxygen  Post-op Assessment: Report given to RN and Post -op Vital signs reviewed and stable  Post vital signs: Reviewed and stable  Last Vitals:  Vitals:   10/09/16 1300 10/09/16 1315  BP: (!) 113/40 136/73  Pulse: 84 85  Resp: 17 14  Temp:      Last Pain:  Vitals:   10/09/16 0958  TempSrc: Oral         Complications: No apparent anesthesia complications

## 2016-10-09 NOTE — Op Note (Signed)
NAMELINDSI, BAYLISS                ACCOUNT NO.:  [id number]  MEDICAL RECORD NO.:  [id number]  LOCATION:                                 FACILITY:  PHYSICIAN:  Pietro Cassis. Alvan Dame, M.D.       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  FINDINGS:  Significant erosive changes to the patellofemoral and lateral compartments of her knee with stable medial-based implant otherwise.  PROCEDURE:  Conversion of failed left partial knee arthroplasty to the left total knee arthroplasty utilizing DePuy implants, MBT revision tibial tray, size 3 with a 4 narrow femoral component was utilized, a size 10-mm posterior stabilized implant.  On the tibia side, used an MBT revision tray, but used a 29 cemented sleeve with a 5-mm augment medially.  SURGEON:  Pietro Cassis. Alvan Dame, M.D.  ASSISTANT:  Danae Orleans, PA-C.  Note that Mr. Guinevere Scarlet was present for the entirety of the case from preoperative position, perioperative management of the operative extremity, general facilitation of the case and primary wound closure.  ANESTHESIA:  Preoperative regional adductor canal block plus general anesthetic.  SPECIMENS:  None.  COMPLICATIONS:  None.  DRAINS:  None.  BLOOD LOSS:  Less than 100 mL.  TOURNIQUET TIME:  49 minutes at 250 mmHg.  INDICATIONS FOR PROCEDURE:  Ms. Lapage is a 61 year old female who had presented to the office following a partial knee arthroplasty.  She has within 2 years of having her partial knee arthroplasty performed and initial radiographs revealed  well-aligned component without evidence of concerns for over-correction, however, in serial radiographic followup with presentations from knee pain showed that she had progressive loss of her lateral joint space.  Her quality of life was significantly affected.  She never presented with wound complications, fevers, chills, or night sweats.  Given the increasing pain and radiographic changes, she at this point is ready to proceed with more definitive measures.  Risks of infection, DVT, component failure, need for future surgeries were all discussed and reviewed.  Consent was obtained for benefit of pain relief.  PROCEDURE IN DETAIL:  The patient was brought to the operative theater. Once adequate anesthesia, preoperative antibiotics administered, 2 g of Ancef, 1 g of tranexamic acid and 10 mg of Decadron, she was positioned supine with left thigh tourniquet placed.  The left lower extremity was then prepped and draped in sterile fashion.  Time-out was performed identifying the patient, planned procedure, and extremity.  Leg was exsanguinated.  Tourniquet elevated to 250 mmHg.  Her old incision was excised and extended proximal over the patella.  Soft tissue planes created.  Median arthrotomy was performed encountering clear synovial fluid with significant hypertrophic synovitic reaction.  She did not have a history of inflammatory arthropathy that we know of.  Initial exposure was carried out and performing synovectomy.  Once I  had the knee adequately exposed and debrided, attention was first directed at the patella.  Precut measurement of patella was noted to be 25 mm, I resected down to 14 mm, used a 38 patellar button.  The lug holes were drilled and a metal Shim was placed to protect the patella from retractors and saw blades.  Attention was now directed to the femur.  The femoral canal was opened to drill, irrigated to try to prevent fat emboli.  An intramedullary rod was passed.   At this point, I resected 10 mm of bone off the distal femur at 3 degrees of valgus.  With this, I used 2.5-inch osteotome and removed the femoral component without significant bone loss.  The saw was then used to freshen this cut up on the distal aspect medially.  There was no significant bone loss noted in this area.  Once this was done, I subluxated the tibia anteriorly and used an extramedullary guide, I made an initial resection of the proximal tibia of 2 mm off the lateral side of the tibia.  When I did this, I was proud laterally compared to the medial compartment, the medial tibial base plate was removed with an oscillating saw without significant bone loss or complication.  Once this was done, I returned attention to the femur.  I have sized the femur to be a size 4, but probably narrow in medial-lateral dimensions. The size 4 rotation block was then pinned into position using the proximal tibia cut laterally as a basis of rotation after confirming that there was a perpendicular cut.  The 4-in-1 block was pinned into position.  Anterior, posterior and chamfer cuts were then made without difficulty or complication.  Final box cut was made on the lateral aspect of distal femur.  At this point, the tibia was subluxated anteriorly.  I prepared the tibia for an MBT revision tray with a sleeve.  I also made certain that had enough room for a 5-mm wedge medially as it appeared that I needed that with a gap balance.  Once the proximal tibia was prepared, the trial size 3 MBT tray was placed with the sleeve and a trial reduction was carried out.  I did this initial trial reduction.  I did find that the knee was tight in both flexion and extension, unable to get fully extended.  For this reason, I removed the trial tibial components and replaced the extramedullary guide and recut 2 more mm of bone off the proximal tibia. This was done on both the lateral sides.  I did feel that  minimum amount of bone needed to be removed off the medial side.  I re-prepared the proximal tibia accordingly.  A repeat trial reduction indicated that now the knee came to full extension and was balanced the flexion.  At this point, all trial components were removed.  We injected the synovial-capsular junction of the knee with 0.25% Marcaine with epinephrine, 1 mL of Toradol and 30 mL of saline, actually it was 25 mL of 0.5% Marcaine with epinephrine.  The knee was copiously irrigated with normal saline solution.  Final components were opened and configured on the back table under my direct supervision.  Once this was done, the knee was irrigated.  Cement was mixed.  The final components were then cemented into place and the knee brought to full extension with a 10-degree trial insert.  Excessive cement was removed.  At this point, once the cement had cured, excessive cement was removed  throughout the knee.  The final 10-mm posterior stabilized insert was opened and placed into the tibia.  The knee was re-irrigated. Tourniquet was let down after 49 minutes without significant hemostasis required.  The extensor mechanism was then reapproximated using #1 Vicryl and 0 V- Loc suture.  The remainder of wound was closed with 2-0 Vicryl and running 4-0 Monocryl.  The knee was cleaned, dried and dressed sterilely using surgical glue, Aquacel dressing.  She was then brought to the recovery room in stable condition tolerating the procedure well, findings were reviewed with family.  She will be weightbearing as tolerated, work on range of motion, try to maximize her overall outcome and result.     Pietro Cassis Alvan Dame, M.D.     MDO/MEDQ  D:  10/09/2016  T:  10/09/2016  Job:  [id number]

## 2016-10-09 NOTE — Interval H&P Note (Signed)
History and Physical Interval Note:  10/09/2016 1:06 PM  Pamela [name]  has presented today for surgery, with the diagnosis of Failed left Left uni compartment arthroplasty to total knee arthroplasty  The various methods of treatment have been discussed with the patient and family. After consideration of risks, benefits and other options for treatment, the patient has consented to  Procedure(s) with comments: Conversion left uni compartment arthroplasty to total knee arthroplasty (Left) - 90 mins as a surgical intervention .  The patient's history has been reviewed, patient examined, no change in status, stable for surgery.  I have reviewed the patient's chart and labs.  Questions were answered to the patient's satisfaction.     Mauri Pole

## 2016-10-10 ENCOUNTER — Encounter (HOSPITAL_COMMUNITY): Payer: Self-pay | Admitting: Orthopedic Surgery

## 2016-10-10 LAB — BASIC METABOLIC PANEL
ANION GAP: 11 (ref 5–15)
BUN: 13 mg/dL (ref 6–20)
CO2: 25 mmol/L (ref 22–32)
Calcium: 8.9 mg/dL (ref 8.9–10.3)
Chloride: 104 mmol/L (ref 101–111)
Creatinine, Ser: 0.69 mg/dL (ref 0.44–1.00)
GFR calc Af Amer: >60 mL/min (ref >60)
GLUCOSE: 108 mg/dL — AB (ref 65–99)
POTASSIUM: 3.8 mmol/L (ref 3.5–5.1)
Sodium: 140 mmol/L (ref 135–145)

## 2016-10-10 LAB — CBC
HCT: 27.2 % — ABNORMAL LOW (ref 36.0–46.0)
HEMOGLOBIN: 9.3 g/dL — AB (ref 12.0–15.0)
MCH: 26.8 pg (ref 26.0–34.0)
MCHC: 34.2 g/dL (ref 30.0–36.0)
MCV: 78.4 fL (ref 78.0–100.0)
Platelets: 270 10*3/uL (ref 150–400)
RBC: 3.47 MIL/uL — AB (ref 3.87–5.11)
RDW: 12.5 % (ref 11.5–15.5)
WBC: 11.1 10*3/uL — ABNORMAL HIGH (ref 4.0–10.5)

## 2016-10-10 NOTE — Progress Notes (Signed)
Physical Therapy Evaluation Patient Details Name: Pamela [name] MRN: [id number] DOB: 10-30-1955 Today's Date: 10/10/2016   History of Present Illness  Pt. is a 61 yo female s/p conversion of L uni compartment arthroplasty to LTKA  Clinical Impression  Pt is s/p surgery listed above resulting in functional limitations due to the deficits listed below. Pt will benefit from skilled PT to increase their independence and safety with mobility to allow discharge. Pt. responded well to treatment. Pt also able to perform exercises but reports needing pain medication following treatment.     Follow Up Recommendations DC plan and follow up therapy as arranged by surgeon;Outpatient PT    Equipment Recommendations  Rolling walker with 5" wheels    Recommendations for Other Services       Precautions / Restrictions Precautions Precautions: Knee;Fall Restrictions Weight Bearing Restrictions: No Other Position/Activity Restrictions: WBAT      Mobility  Bed Mobility Overal bed mobility: Needs Assistance Bed Mobility: Supine to Sit     Supine to sit: HOB elevated;Min guard     General bed mobility comments: Min guard for safety, cues provided for LE management, incr time  Transfers Overall transfer level: Needs assistance Equipment used: Rolling walker (2 wheeled) Transfers: Sit to/from Stand Sit to Stand: Min assist         General transfer comment: Min assist provied for rise and steady, cues provided for hand placement on RW and management of LE   Ambulation/Gait Ambulation/Gait assistance: Min guard Ambulation Distance (Feet): 25 Feet Assistive device: Rolling walker (2 wheeled) Gait Pattern/deviations: Decreased step length - right;Decreased step length - left;Step-through pattern;Antalgic;Trunk flexed Gait velocity: decr   General Gait Details: Pt able to walk 25 ft with pain held constant at 7/10, pt provided cues for sequencing of RW   Stairs             Wheelchair Mobility    Modified Rankin (Stroke Patients Only)       Balance Overall balance assessment: Needs assistance   Sitting balance-Leahy Scale: Good     Standing balance support: Bilateral upper extremity supported Standing balance-Leahy Scale: Poor Standing balance comment: Pt requires use of RW to stand                              Pertinent Vitals/Pain Pain Assessment: 0-10 Pain Score: 7  Pain Location: L Knee Pain Descriptors / Indicators: Aching;Sore;Grimacing Pain Intervention(s): Limited activity within patient's tolerance;Monitored during session;Patient requesting pain meds-RN notified;Repositioned;Ice applied;Premedicated before session    Home Living Family/patient expects to be discharged to:: Private residence Living Arrangements: Spouse/significant other Available Help at Discharge: Family Type of Home: House Home Access: Stairs to enter Entrance Stairs-Rails: Left Entrance Stairs-Number of Steps: 2 Home Layout: One level Home Equipment: None      Prior Function Level of Independence: Independent               Hand Dominance        Extremity/Trunk Assessment   Upper Extremity Assessment Upper Extremity Assessment: Overall WFL for tasks assessed    Lower Extremity Assessment Lower Extremity Assessment: RLE deficits/detail RLE Deficits / Details: L knee s/p TKA, AAROM approx. 80 degrees, L foot still numb and pt unable to dorsiflex        Communication   Communication: No difficulties  Cognition Arousal/Alertness: Awake/alert Behavior During Therapy: WFL for tasks assessed/performed Overall Cognitive Status: Within Functional Limits for tasks assessed  General Comments      Exercises Total Joint Exercises Ankle Circles/Pumps: AROM;10 reps;Seated;Both Quad Sets: 10 reps;Seated;Both;AROM Towel Squeeze: AROM;10 reps;Both;Seated Knee Flexion: AAROM;10  reps;Supine;Left Goniometric ROM: approx 80 degrees    Assessment/Plan    PT Assessment Patient needs continued PT services  PT Problem List Decreased strength;Decreased mobility;Decreased safety awareness;Decreased range of motion;Decreased coordination;Decreased knowledge of precautions;Decreased activity tolerance;Decreased balance;Decreased knowledge of use of DME       PT Treatment Interventions DME instruction;Therapeutic activities;Gait training;Therapeutic exercise;Patient/family education;Functional mobility training;Balance training    PT Goals (Current goals can be found in the Care Plan section)  Acute Rehab PT Goals Patient Stated Goal: Pt would like to return home  PT Goal Formulation: With patient Time For Goal Achievement: 10/24/16 Potential to Achieve Goals: Good    Frequency 7X/week   Barriers to discharge        Co-evaluation               AM-PAC PT "6 Clicks" Daily Activity  Outcome Measure Difficulty turning over in bed (including adjusting bedclothes, sheets and blankets)?: A Lot Difficulty moving from lying on back to sitting on the side of the bed? : A Lot Difficulty sitting down on and standing up from a chair with arms (e.g., wheelchair, bedside commode, etc,.)?: Total Help needed moving to and from a bed to chair (including a wheelchair)?: A Little Help needed walking in hospital room?: A Little Help needed climbing 3-5 steps with a railing? : A Little 6 Click Score: 14    End of Session Equipment Utilized During Treatment: Gait belt Activity Tolerance: Patient tolerated treatment well Patient left: in chair;with call bell/phone within reach;with family/visitor present Nurse Communication: Patient requests pain meds PT Visit Diagnosis: Difficulty in walking, not elsewhere classified (R26.2)    Time: 5670-1410 PT Time Calculation (min) (ACUTE ONLY): 30 min   Charges:   PT Evaluation $PT Eval Low Complexity: 1 Low PT  Treatments $Therapeutic Exercise: 8-22 mins   PT G Codes:        Olegario Shearer, SPT  Reino Bellis 10/10/2016, 11:51 AM

## 2016-10-10 NOTE — Progress Notes (Signed)
Patient ID: Pamela [name], female   DOB: 1955-06-12, 61 y.o.   MRN: [id number] Subjective: 1 Day Post-Op Procedure(s) (LRB): Conversion left uni compartment arthroplasty to total knee arthroplasty (Left)    Patient reports pain as moderate.  Up ordering breakfast this am, husband in room.  No events  Objective:   VITALS:   Vitals:   10/10/16 1017 10/10/16 1300  BP: 137/68 124/70  Pulse: 96 79  Resp: 15 16  Temp: 98.6 F (37 C) 98.6 F (37 C)    Neurovascular intact Incision: dressing C/D/I  LABS  Recent Labs  10/10/16 0538  HGB 9.3*  HCT 27.2*  WBC 11.1*  PLT 270     Recent Labs  10/10/16 0538  NA 140  K 3.8  BUN 13  CREATININE 0.69  GLUCOSE 108*    No results for input(s): LABPT, INR in the last 72 hours.   Assessment/Plan: 1 Day Post-Op Procedure(s) (LRB): Conversion left uni compartment arthroplasty to total knee arthroplasty (Left)   Advance diet Up with therapy Plan for discharge tomorrow with out pt ordered  Reviewed intra-operative findings with regards to the advanced rate of joint disease progression Reviewed goals and follow up

## 2016-10-10 NOTE — Progress Notes (Signed)
Discharge planning, no HH needs identified. Plan for OP PT, has DME. [phone] 

## 2016-10-10 NOTE — Progress Notes (Signed)
Physical Therapy Treatment Patient Details Name: Pamela [name] MRN: [id number] DOB: Nov 10, 1955 Today's Date: 10/10/2016    History of Present Illness Pt. is a 61 yo female s/p conversion of L uni compartment arthroplasty to LTKA    PT Comments    Pt assisted with ambulating again in hallway and able to improve distance slightly.  Pt reporting pain in L knee and continues to have numbness in L foot and ankle, also decreased dorsiflexion at rest and during ambulation.  Follow Up Recommendations  DC plan and follow up therapy as arranged by surgeon;Outpatient PT     Equipment Recommendations  Rolling walker with 5" wheels    Recommendations for Other Services       Precautions / Restrictions Precautions Precautions: Knee;Fall Restrictions Other Position/Activity Restrictions: WBAT    Mobility  Bed Mobility Overal bed mobility: Needs Assistance Bed Mobility: Supine to Sit;Sit to Supine     Supine to sit: Min assist Sit to supine: Min guard   General bed mobility comments: assist for L LE, pt able to self assist L LE onto bed  Transfers Overall transfer level: Needs assistance Equipment used: Rolling walker (2 wheeled) Transfers: Sit to/from Stand Sit to Stand: Min guard         General transfer comment: verbal cues provided for hand placement on RW and management of LE   Ambulation/Gait Ambulation/Gait assistance: Min guard Ambulation Distance (Feet): 40 Feet Assistive device: Rolling walker (2 wheeled) Gait Pattern/deviations: Step-to pattern;Decreased stance time - left;Antalgic;Decreased dorsiflexion - left     General Gait Details: pt still with L ankle/foot numbness, slow but steady gait, distance to tolerance   Stairs            Wheelchair Mobility    Modified Rankin (Stroke Patients Only)       Balance                                            Cognition Arousal/Alertness: Awake/alert Behavior During Therapy: WFL for  tasks assessed/performed Overall Cognitive Status: Within Functional Limits for tasks assessed                                        Exercises      General Comments        Pertinent Vitals/Pain Pain Assessment: 0-10 Pain Score: 5  Pain Location: L Knee Pain Descriptors / Indicators: Aching;Sore;Grimacing Pain Intervention(s): Limited activity within patient's tolerance;Repositioned;Monitored during session;Premedicated before session;Ice applied    Home Living                      Prior Function            PT Goals (current goals can now be found in the care plan section) Progress towards PT goals: Progressing toward goals    Frequency    7X/week      PT Plan Current plan remains appropriate    Co-evaluation              AM-PAC PT "6 Clicks" Daily Activity  Outcome Measure  Difficulty turning over in bed (including adjusting bedclothes, sheets and blankets)?: A Little Difficulty moving from lying on back to sitting on the side of the bed? : Total Difficulty sitting down on and standing  up from a chair with arms (e.g., wheelchair, bedside commode, etc,.)?: A Little Help needed moving to and from a bed to chair (including a wheelchair)?: A Little Help needed walking in hospital room?: A Little Help needed climbing 3-5 steps with a railing? : A Little 6 Click Score: 16    End of Session Equipment Utilized During Treatment: Gait belt Activity Tolerance: Patient tolerated treatment well Patient left: with call bell/phone within reach;with family/visitor present;in bed   PT Visit Diagnosis: Difficulty in walking, not elsewhere classified (R26.2)     Time: 3612-2449 PT Time Calculation (min) (ACUTE ONLY): 15 min  Charges:  $Gait Training: 8-22 mins                    G Codes:       Carmelia Bake, PT, DPT 10/10/2016 Pager: [phone]   York Ram E 10/10/2016, 3:21 PM

## 2016-10-11 LAB — BASIC METABOLIC PANEL
Anion gap: 8 (ref 5–15)
BUN: 10 mg/dL (ref 6–20)
CALCIUM: 8.9 mg/dL (ref 8.9–10.3)
CHLORIDE: 105 mmol/L (ref 101–111)
CO2: 28 mmol/L (ref 22–32)
CREATININE: 0.59 mg/dL (ref 0.44–1.00)
GFR calc Af Amer: >60 mL/min (ref >60)
GLUCOSE: 115 mg/dL — AB (ref 65–99)
Potassium: 3.5 mmol/L (ref 3.5–5.1)
Sodium: 141 mmol/L (ref 135–145)

## 2016-10-11 LAB — CBC
HEMATOCRIT: 25.8 % — AB (ref 36.0–46.0)
HEMOGLOBIN: 8.8 g/dL — AB (ref 12.0–15.0)
MCH: 26.2 pg (ref 26.0–34.0)
MCHC: 34.1 g/dL (ref 30.0–36.0)
MCV: 76.8 fL — AB (ref 78.0–100.0)
Platelets: 235 10*3/uL (ref 150–400)
RBC: 3.36 MIL/uL — ABNORMAL LOW (ref 3.87–5.11)
RDW: 12.4 % (ref 11.5–15.5)
WBC: 9.6 10*3/uL (ref 4.0–10.5)

## 2016-10-11 MED ORDER — HYDROMORPHONE HCL 2 MG PO TABS
2.0000 mg | ORAL_TABLET | ORAL | Status: DC
  Administered 2016-10-11 – 2016-10-13 (×13): 4 mg via ORAL
  Filled 2016-10-11 (×13): qty 2

## 2016-10-11 NOTE — Progress Notes (Signed)
     Subjective: 2 Days Post-Op Procedure(s) (LRB): Conversion left uni compartment arthroplasty to total knee arthroplasty (Left)   Patient reports pain as moderate, not fully controlled.  Complaining of more foot pain and foot drop. No events otherwise throughout the night.    Objective:   VITALS:   Vitals:   10/10/16 2149 10/11/16 0548  BP: (!) 164/83 129/67  Pulse: 87 94  Resp: 16 16  Temp: 98.6 F (37 C) 98.3 F (36.8 C)    Incision: dressing C/D/I No cellulitis present Compartment soft  Left foot drop with numbness on the plantar surface of the foot   LABS  Recent Labs  10/10/16 0538 10/11/16 0628  HGB 9.3* 8.8*  HCT 27.2* 25.8*  WBC 11.1* 9.6  PLT 270 235     Recent Labs  10/10/16 0538 10/11/16 0628  NA 140 141  K 3.8 3.5  BUN 13 10  CREATININE 0.69 0.59  GLUCOSE 108* 115*     Assessment/Plan: 2 Days Post-Op Procedure(s) (LRB): Conversion left uni compartment arthroplasty to total knee arthroplasty (Left) Advance diet Up with therapy D/C IV fluids Discharge home eventually when ready   Left Drop Foot AFO brace ordered     [place]. Charletha Dalpe   PAC  10/11/2016, 9:28 AM

## 2016-10-11 NOTE — Progress Notes (Signed)
OT Cancellation Note  Patient Details Name: Pamela [name] MRN: [id number] DOB: 1955-09-18   Cancelled Treatment:    Reason Eval/Treat Not Completed: Other (comment);Fatigue/lethargy limiting ability to participate;Pain limiting ability to participate  OT role explained. Pt stated she wanted OT to come back later this day or before DC.   Kari Baars, [place]  Payton Mccallum D 10/11/2016, 11:50 AM

## 2016-10-11 NOTE — Progress Notes (Signed)
Physical Therapy Treatment Patient Details Name: Pamela [name] MRN: [id number] DOB: 1956/02/24 Today's Date: 10/11/2016    History of Present Illness Pt. 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  Pt willing to attempt ambulation and tolerated well without increase in pain.  AFO in room however pt and spouse report AFO requires wider shoe then pt's current shoe.   Follow Up Recommendations  DC plan and follow up therapy as arranged by surgeon;Outpatient PT     Equipment Recommendations  Rolling walker with 5" wheels    Recommendations for Other Services       Precautions / Restrictions Precautions Precautions: Fall;Knee Precaution Comments: AFO in room, needs wide shoe for AFO to fit per pt and spouse Restrictions Weight Bearing Restrictions: No Other Position/Activity Restrictions: WBAT    Mobility  Bed Mobility Overal bed mobility: Independent Bed Mobility: Supine to Sit     Supine to sit: Min assist     General bed mobility comments: verbal cues for technique, assist provided for L LE due to pain  Transfers Overall transfer level: Needs assistance Equipment used: Rolling walker (2 wheeled) Transfers: Sit to/from Stand Sit to Stand: Min guard         General transfer comment: verbal cues provided for LE management   Ambulation/Gait Ambulation/Gait assistance: Min guard Ambulation Distance (Feet): 80 Feet Assistive device: Rolling walker (2 wheeled) Gait Pattern/deviations: Step-to pattern;Decreased stance time - left;Antalgic;Decreased dorsiflexion - left Gait velocity: decr   General Gait Details: slow gait, cues for small step for better pain control, recliner following due to untolerable pain earlier and RN reports increased pain meds given since last session   Stairs            Wheelchair Mobility    Modified  Rankin (Stroke Patients Only)       Balance Overall balance assessment: Needs assistance   Sitting balance-Leahy Scale: Good     Standing balance support: Bilateral upper extremity supported Standing balance-Leahy Scale: Poor Standing balance comment: Pt requires use of RW to stand                             Cognition Arousal/Alertness: Awake/alert Behavior During Therapy: WFL for tasks assessed/performed Overall Cognitive Status: Within Functional Limits for tasks assessed                                        Exercises Total Joint Exercises Quad Sets: 10 reps;AROM;Both;Seated Knee Flexion: AAROM;10 reps;Left Goniometric ROM: approx 80 degrees    General Comments        Pertinent Vitals/Pain Pain Assessment: 0-10 Pain Score: 5  Pain Location: L Knee Pain Descriptors / Indicators: Aching;Sore Pain Intervention(s): Limited activity within patient's tolerance;Repositioned;Premedicated before session;Ice applied    Home Living                      Prior Function            PT Goals (current goals can now be found in the care plan section) Progress towards PT goals: Progressing toward goals    Frequency    7X/week      PT Plan Current plan remains appropriate  Co-evaluation              AM-PAC PT "6 Clicks" Daily Activity  Outcome Measure  Difficulty turning over in bed (including adjusting bedclothes, sheets and blankets)?: A Little Difficulty moving from lying on back to sitting on the side of the bed? : A Little Difficulty sitting down on and standing up from a chair with arms (e.g., wheelchair, bedside commode, etc,.)?: A Little Help needed moving to and from a bed to chair (including a wheelchair)?: A Little Help needed walking in hospital room?: A Little Help needed climbing 3-5 steps with a railing? : A Little 6 Click Score: 18    End of Session Equipment Utilized During Treatment: Gait  belt Activity Tolerance: Patient tolerated treatment well Patient left: in chair;with family/visitor present;with call bell/phone within reach Nurse Communication: Mobility status PT Visit Diagnosis: Difficulty in walking, not elsewhere classified (R26.2)     Time: 1245-8099 PT Time Calculation (min) (ACUTE ONLY): 16 min  Charges:  $Gait Training: 8-22 mins                    G Codes:       Carmelia Bake, PT, DPT 10/11/2016 Pager: [phone]  York Ram E 10/11/2016, 3:21 PM

## 2016-10-11 NOTE — Progress Notes (Signed)
Physical Therapy Treatment Patient Details Name: Pamela [name] MRN: [id number] DOB: 1955-06-06 Today's Date: 10/11/2016    History of Present Illness Pt. 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 Pain was 5/10 upon arrival but elevated to 10/10 with activity. Pt able to ambulate 20 ft and perform several seated exercises but then requested pain medication. Treatment session ended early and RN notified of status and request for pain medication. Noted AFO had been ordered and RN reported that brace might be delivered today.    Follow Up Recommendations  DC plan and follow up therapy as arranged by surgeon;Outpatient PT     Equipment Recommendations  Rolling walker with 5" wheels    Recommendations for Other Services       Precautions / Restrictions Precautions Precautions: Fall;Knee Restrictions Weight Bearing Restrictions: No Other Position/Activity Restrictions: WBAT    Mobility  Bed Mobility Overal bed mobility: Needs Assistance Bed Mobility: Supine to Sit     Supine to sit: Min guard;HOB elevated     General bed mobility comments: Cues provided for LE management but pt able to self assist and steady from supine to sit, min guard provided for safety  Transfers Overall transfer level: Needs assistance Equipment used: Rolling walker (2 wheeled) Transfers: Sit to/from Stand Sit to Stand: Min guard         General transfer comment: verbal cues provided for LE management   Ambulation/Gait Ambulation/Gait assistance: Min guard Ambulation Distance (Feet): 20 Feet Assistive device: Rolling walker (2 wheeled) Gait Pattern/deviations: Step-to pattern;Decreased stance time - left;Antalgic;Decreased dorsiflexion - left Gait velocity: decr   General Gait Details: Pt still with L ankle/foot numbness, slow gait, pt was only able to tolerate 20 feet before pain increased    Stairs             Wheelchair Mobility    Modified Rankin (Stroke Patients Only)       Balance Overall balance assessment: Needs assistance   Sitting balance-Leahy Scale: Good     Standing balance support: Bilateral upper extremity supported Standing balance-Leahy Scale: Poor Standing balance comment: Pt requires use of RW to stand                             Cognition Arousal/Alertness: Awake/alert Behavior During Therapy: WFL for tasks assessed/performed Overall Cognitive Status: Within Functional Limits for tasks assessed                                        Exercises Total Joint Exercises Quad Sets: 10 reps;AROM;Both;Seated Knee Flexion: AAROM;10 reps;Left Goniometric ROM: approx 80 degrees    General Comments        Pertinent Vitals/Pain Pain Score: 5  (Pain started at 5/10 but elevated to 10/10 following activity) Pain Location: L Knee Pain Descriptors / Indicators: Aching;Sore;Grimacing Pain Intervention(s): Limited activity within patient's tolerance;Monitored during session;Patient requesting pain meds-RN notified;Repositioned;Ice applied;Premedicated before session    Home Living                      Prior Function            PT Goals (current goals can now be found in the care plan section) Progress towards PT goals: Progressing toward goals  Frequency    7X/week      PT Plan Current plan remains appropriate    Co-evaluation              AM-PAC PT "6 Clicks" Daily Activity  Outcome Measure  Difficulty turning over in bed (including adjusting bedclothes, sheets and blankets)?: A Little Difficulty moving from lying on back to sitting on the side of the bed? : A Little Difficulty sitting down on and standing up from a chair with arms (e.g., wheelchair, bedside commode, etc,.)?: A Little Help needed moving to and from a bed to chair (including a wheelchair)?: A Little Help needed walking in hospital  room?: A Little Help needed climbing 3-5 steps with a railing? : A Little 6 Click Score: 18    End of Session Equipment Utilized During Treatment: Gait belt Activity Tolerance: Patient limited by pain Patient left: with call bell/phone within reach;with family/visitor present;in chair Nurse Communication: Patient requests pain meds PT Visit Diagnosis: Difficulty in walking, not elsewhere classified (R26.2)     Time: 1030-1049 PT Time Calculation (min) (ACUTE ONLY): 19 min  Charges:  $Gait Training: 8-22 mins                    G Codes:       Olegario Shearer, SPT   Reino Bellis 10/11/2016, 1:14 PM

## 2016-10-12 NOTE — Progress Notes (Signed)
Patient ID: Pamela [name], female   DOB: 24-Jun-1955, 61 y.o.   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  Feels that she is slowly improve.  Reviewed findings of left peroneal dysfunction.  AFO brace ordered and in room  Objective:   VITALS:   Vitals:   10/11/16 2220 10/12/16 0512  BP: (!) 153/65 136/74  Pulse: 97 (!) 111  Resp: 16 16  Temp: 99.1 F (37.3 C) 99.3 F (37.4 C)    Neurovascular intact Incision: dressing C/D/I - ACE wrap removed today Left foot no active dorsiflexion Does report intact sensibility on dorsum of her foot, bette than yesterday   LABS  Recent Labs  10/10/16 0538 10/11/16 0628  HGB 9.3* 8.8*  HCT 27.2* 25.8*  WBC 11.1* 9.6  PLT 270 235     Recent Labs  10/10/16 0538 10/11/16 0628  NA 140 141  K 3.8 3.5  BUN 13 10  CREATININE 0.69 0.59  GLUCOSE 108* 115*    No results for input(s): LABPT, INR in the last 72 hours.   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

## 2016-10-12 NOTE — Progress Notes (Signed)
Physical Therapy Treatment Patient Details Name: Pamela [name] MRN: [id number] DOB: [birth year] Today's Date: 10/12/2016    History of Present Illness Pt. 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 Pt demonstrated improved gait with AFO and reports being in less pain today. Pt able to ambulate and perform exercises. Progress to perform stairs this afternoon.    Follow Up Recommendations  DC plan and follow up therapy as arranged by surgeon;Outpatient PT     Equipment Recommendations  Rolling walker with 5" wheels    Recommendations for Other Services       Precautions / Restrictions Precautions Precautions: Fall;Knee Precaution Comments: AFO was in room with appropriate shoe, AFO was used during treatment session Required Braces or Orthoses: Other Brace/Splint Other Brace/Splint: L AFO  Restrictions Weight Bearing Restrictions: No Other Position/Activity Restrictions: WBAT    Mobility  Bed Mobility         Supine to sit: Min assist     General bed mobility comments: Pt OOB and in chair when PT arrived  Transfers Overall transfer level: Needs assistance Equipment used: Rolling walker (2 wheeled) Transfers: Sit to/from Stand Sit to Stand: Min guard         General transfer comment: Verbal cues provided for LE management   Ambulation/Gait Ambulation/Gait assistance: Min guard Ambulation Distance (Feet): 80 Feet Assistive device: Rolling walker (2 wheeled) Gait Pattern/deviations: Step-to pattern;Decreased stance time - left;Antalgic Gait velocity: decr    General Gait Details: Pt wore AFO during ambulation and had increased clearance on L side, Pt provided with cues for RW placement and sequence during ambulation    Stairs            Wheelchair Mobility    Modified Rankin (Stroke Patients Only)       Balance Overall balance assessment: Needs assistance    Sitting balance-Leahy Scale: Good     Standing balance support: Bilateral upper extremity supported Standing balance-Leahy Scale: Fair Standing balance comment: Pt requires use of RW for ambulation and activity while standing                             Cognition Arousal/Alertness: Awake/alert Behavior During Therapy: WFL for tasks assessed/performed Overall Cognitive Status: Within Functional Limits for tasks assessed                                        Exercises Total Joint Exercises Ankle Circles/Pumps: AROM;10 reps;Seated;Both Quad Sets:  (Pt had completed quad sets prior to PT arrival ) Towel Squeeze: AROM;10 reps;Both;Seated Heel Slides: AAROM;10 reps;Seated;Left    General Comments        Pertinent Vitals/Pain Pain Assessment: 0-10 Pain Score: 8  Pain Location: L Knee Pain Descriptors / Indicators: Aching;Sore Pain Intervention(s): Limited activity within patient's tolerance;Monitored during session;Premedicated before session;Repositioned;Ice applied    Home Living Family/patient expects to be discharged to:: Private residence Living Arrangements: Spouse/significant other Available Help at Discharge: Family         Home Equipment: Bedside commode      Prior Function Level of Independence: Independent          PT Goals (current goals can now be found in the care plan section) Acute Rehab PT Goals Patient Stated Goal: Pt would  like to return home  Progress towards PT goals: Progressing toward goals    Frequency    7X/week      PT Plan Current plan remains appropriate    Co-evaluation              AM-PAC PT "6 Clicks" Daily Activity  Outcome Measure  Difficulty turning over in bed (including adjusting bedclothes, sheets and blankets)?: None Difficulty moving from lying on back to sitting on the side of the bed? : A Little Difficulty sitting down on and standing up from a chair with arms (e.g., wheelchair,  bedside commode, etc,.)?: A Little Help needed moving to and from a bed to chair (including a wheelchair)?: A Little Help needed walking in hospital room?: A Little Help needed climbing 3-5 steps with a railing? : A Little 6 Click Score: 19    End of Session Equipment Utilized During Treatment: Gait belt Activity Tolerance: Patient tolerated treatment well Patient left: in chair;with family/visitor present;with call bell/phone within reach Nurse Communication: Mobility status PT Visit Diagnosis: Difficulty in walking, not elsewhere classified (R26.2)     Time: 1015-1040 PT Time Calculation (min) (ACUTE ONLY): 25 min  Charges:  $Gait Training: 8-22 mins $Therapeutic Exercise: 8-22 mins                    G Codes:       Olegario Shearer, SPT   Reino Bellis 10/12/2016, 2:01 PM

## 2016-10-12 NOTE — Progress Notes (Signed)
Physical Therapy Treatment Patient Details Name: Pamela [name] MRN: [id number] DOB: [birth year] Today's Date: 10/12/2016    History of Present Illness Pt. 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  Pt reports d/c home likely tomorrow.   Follow Up Recommendations  DC plan and follow up therapy as arranged by surgeon;Outpatient PT     Equipment Recommendations  Rolling walker with 5" wheels    Recommendations for Other Services       Precautions / Restrictions Precautions Precautions: Fall;Knee Required Braces or Orthoses: Other Brace/Splint Other Brace/Splint: L AFO  Restrictions Other Position/Activity Restrictions: WBAT    Mobility  Bed Mobility Overal bed mobility: Needs Assistance Bed Mobility: Supine to Sit;Sit to Supine     Supine to sit: Min guard Sit to supine: Min guard   General bed mobility comments: pt able to self assist L LE   Transfers Overall transfer level: Needs assistance Equipment used: Rolling walker (2 wheeled) Transfers: Sit to/from Stand Sit to Stand: Min guard         General transfer comment: pt able to recall appropriate hand and LE placement  Ambulation/Gait Ambulation/Gait assistance: Min guard Ambulation Distance (Feet): 40 Feet Assistive device: Rolling walker (2 wheeled) Gait Pattern/deviations: Step-to pattern;Decreased stance time - left;Antalgic Gait velocity: decr    General Gait Details: verbal cues for RW positioning and step length   Stairs Stairs: Yes   Stair Management: One rail Left;Step to pattern;Forwards Number of Stairs: 2 General stair comments: therapist demonstrated technique for using both hands on left rail and then pt performed with cues for safety and sequencing, may try backwards with RW tomorrow per pt preference  Wheelchair Mobility    Modified Rankin (Stroke Patients  Only)       Balance                                            Cognition Arousal/Alertness: Awake/alert Behavior During Therapy: WFL for tasks assessed/performed Overall Cognitive Status: Within Functional Limits for tasks assessed                                        Exercises      General Comments        Pertinent Vitals/Pain Pain Assessment: 0-10 Pain Score: 6  Pain Location: L Knee Pain Descriptors / Indicators: Aching;Sore Pain Intervention(s): Ice applied;Limited activity within patient's tolerance;Monitored during session;Repositioned    Home Living                      Prior Function            PT Goals (current goals can now be found in the care plan section) Progress towards PT goals: Progressing toward goals    Frequency    7X/week      PT Plan Current plan remains appropriate    Co-evaluation              AM-PAC PT "6 Clicks" Daily Activity  Outcome Measure  Difficulty turning over in bed (including adjusting bedclothes, sheets and blankets)?: None Difficulty moving from lying on back to sitting on the  side of the bed? : A Little Difficulty sitting down on and standing up from a chair with arms ([name].g., wheelchair, bedside commode, etc,.)?: A Little Help needed moving to and from a bed to chair (including a wheelchair)?: A Little Help needed walking in hospital room?: A Little Help needed climbing 3-5 steps with a railing? : A Little 6 Click Score: 19    End of Session Equipment Utilized During Treatment: Gait belt Activity Tolerance: Patient tolerated treatment well Patient left: in bed;with call bell/phone within reach   PT Visit Diagnosis: Difficulty in walking, not elsewhere classified (R26.2)     Time: 1610-9604 PT Time Calculation (min) (ACUTE ONLY): 12 min  Charges:  $Gait Training: 8-22 mins                    G Codes:      Carmelia Bake, PT, DPT 10/12/2016 Pager:  [phone]  Pamela [name] 10/12/2016, 4:21 PM

## 2016-10-12 NOTE — Evaluation (Signed)
Occupational Therapy Evaluation Patient Details Name: LAYNIE ESPY MRN: [id number] DOB: 09-25-1955 Today's Date: 10/12/2016    History of Present Illness Pt. 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 All education was completed. No further OT is needed at this time    Follow Up Recommendations  Supervision/Assistance - 24 hour    Equipment Recommendations  None recommended by OT    Recommendations for Other Services       Precautions / Restrictions Precautions Precautions: Fall;Knee Precaution Comments: AFO was in room with appropriate shoe, AFO was used during treatment session Required Braces or Orthoses: Other Brace/Splint Other Brace/Splint: L AFO  Restrictions Weight Bearing Restrictions: No Other Position/Activity Restrictions: WBAT      Mobility Bed Mobility         Supine to sit: Min assist     General bed mobility comments: assist for LLE  Transfers Overall transfer level: Needs assistance Equipment used: Rolling walker (2 wheeled) Transfers: Sit to/from Stand Sit to Stand: Min guard         General transfer comment: for safety. Cues for LLE placement    Balance Overall balance assessment: Needs assistance   Sitting balance-Leahy Scale: Good     Standing balance support: Bilateral upper extremity supported Standing balance-Leahy Scale: Fair Standing balance comment: Pt requires use of RW for ambulation and activity while standing                            ADL either performed or assessed with clinical judgement   ADL Overall ADL's : Needs assistance/impaired Eating/Feeding: Independent   Grooming: Oral care;Standing;Supervision/safety   Upper Body Bathing: Set up;Sitting   Lower Body Bathing: Minimal assistance;Sit to/from stand   Upper Body Dressing : Set up;Sitting   Lower Body Dressing: Moderate assistance;Sit to/from stand    Toilet Transfer: Min guard;Ambulation;BSC;RW   Toileting- Water quality scientist and Hygiene: Min guard;Sit to/from stand         General ADL Comments: Husband is available to assist as needed.  Pt has new AFO and used it when ambulating to bathroom. Pt will sponge bathe at home.  She is not interested in a tub bench at this time     Vision         Perception     Praxis      Pertinent Vitals/Pain Pain Assessment: 0-10 Pain Score: 8  Pain Location: L Knee Pain Descriptors / Indicators: Aching;Sore Pain Intervention(s): Limited activity within patient's tolerance;Monitored during session;Premedicated before session;Repositioned;Ice applied     Hand Dominance     Extremity/Trunk Assessment Upper Extremity Assessment Upper Extremity Assessment: Overall WFL for tasks assessed           Communication Communication Communication: No difficulties   Cognition Arousal/Alertness: Awake/alert Behavior During Therapy: WFL for tasks assessed/performed Overall Cognitive Status: Within Functional Limits for tasks assessed                                     General Comments       Exercises    Shoulder Instructions      Home Living Family/patient expects to be discharged to:: Private residence Living Arrangements: Spouse/significant other Available Help at Discharge: Family  Bathroom Shower/Tub: Teacher, early years/pre: Handicapped height     Home Equipment: Bedside commode          Prior Functioning/Environment Level of Independence: Independent                 OT Problem List:        OT Treatment/Interventions:      OT Goals(Current goals can be found in the care plan section) Acute Rehab OT Goals Patient Stated Goal: Pt would like to return home  OT Goal Formulation: All assessment and education complete, DC therapy  OT Frequency:     Barriers to D/C:            Co-evaluation               AM-PAC PT "6 Clicks" Daily Activity     Outcome Measure Help from another person eating meals?: None Help from another person taking care of personal grooming?: A Little Help from another person toileting, which includes using toliet, bedpan, or urinal?: A Little Help from another person bathing (including washing, rinsing, drying)?: A Little Help from another person to put on and taking off regular upper body clothing?: A Little Help from another person to put on and taking off regular lower body clothing?: A Lot 6 Click Score: 18   End of Session    Activity Tolerance: Patient tolerated treatment well Patient left: in chair;with call bell/phone within reach;with family/visitor present  OT Visit Diagnosis: Pain Pain - Right/Left: Left Pain - part of body: Knee                Time: 0930-1001 OT Time Calculation (min): 31 min Charges:  OT General Charges $OT Visit: 1 Procedure OT Evaluation $OT Eval Low Complexity: 1 Procedure OT Treatments $Self Care/Home Management : 8-22 mins G-Codes:     [place], OTR/L [phone] 10/12/2016  Darnesha Diloreto 10/12/2016, 11:04 AM

## 2016-10-13 MED ORDER — POLYETHYLENE GLYCOL 3350 17 G PO PACK: 17.0000 g | PACK | Freq: Two times a day (BID) | ORAL | 0 refills | Status: DC

## 2016-10-13 MED ORDER — DOCUSATE SODIUM 100 MG PO CAPS: 100.0000 mg | ORAL_CAPSULE | Freq: Two times a day (BID) | ORAL | 0 refills | Status: DC

## 2016-10-13 MED ORDER — HYDROMORPHONE HCL 2 MG PO TABS: 2.0000 mg | ORAL_TABLET | ORAL | 0 refills | Status: DC | PRN

## 2016-10-13 MED ORDER — FERROUS SULFATE 325 (65 FE) MG PO TABS: 325.0000 mg | ORAL_TABLET | Freq: Three times a day (TID) | ORAL | 3 refills | Status: DC

## 2016-10-13 MED ORDER — CELECOXIB 200 MG PO CAPS: 200.0000 mg | ORAL_CAPSULE | Freq: Two times a day (BID) | ORAL | 0 refills | Status: DC

## 2016-10-13 MED ORDER — CYCLOBENZAPRINE HCL 5 MG PO TABS: ORAL_TABLET | ORAL | 0 refills | Status: DC

## 2016-10-13 MED ORDER — ASPIRIN 81 MG PO CHEW: 81.0000 mg | CHEWABLE_TABLET | Freq: Two times a day (BID) | ORAL | 0 refills | Status: DC

## 2016-10-13 NOTE — Progress Notes (Signed)
Physical Therapy Treatment Patient Details Name: Pamela [name] MRN: [id number] DOB: February 21, 1956 Today's Date: 10/13/2016    History of Present Illness Pt. is a 61 yo female s/p conversion of L uni compartment arthroplasty to LTKA    PT Comments    Pt tolerated treatment well. Stair mobility and exercises were reviewed with pt and spouse. Stair mobility was conducted using two different techniques. Pt was able to demonstrate both forward and backward stair mobility. Pt to continue with outpatient PT on 10/18/16.    Follow Up Recommendations  DC plan and follow up therapy as arranged by surgeon;Outpatient PT     Equipment Recommendations  None recommended by PT (Pt states she has RW at home)    Recommendations for Other Services       Precautions / Restrictions Precautions Precautions: Fall;Knee Precaution Comments: AFO was used during treatment session Required Braces or Orthoses: Other Brace/Splint Other Brace/Splint: L AFO  Restrictions Weight Bearing Restrictions: No Other Position/Activity Restrictions: WBAT    Mobility  Bed Mobility Overal bed mobility: Needs Assistance Bed Mobility: Supine to Sit     Supine to sit: Supervision     General bed mobility comments: Supervision provided for safety, pt able to self assist L LE management   Transfers Overall transfer level: Needs assistance Equipment used: Rolling walker (2 wheeled) Transfers: Sit to/from Stand Sit to Stand: Supervision         General transfer comment: Supervision provided for safety,   Ambulation/Gait Ambulation/Gait assistance: Min guard Ambulation Distance (Feet): 100 Feet Assistive device: Rolling walker (2 wheeled) Gait Pattern/deviations: Step-to pattern;Decreased stance time - left;Antalgic Gait velocity: decr    General Gait Details: verbal cues for RW positioning    Stairs     Stair Management: One rail Left;Step to pattern;Forwards;Backwards;With walker Number of Stairs:  2 General stair comments: Pt able to perform technique and stair mobility forward using left hand rail and backward using RW. Pt and spouse were educated and able to demonstrate both Development worker, community    Modified Rankin (Stroke Patients Only)       Balance Overall balance assessment: Needs assistance   Sitting balance-Leahy Scale: Good       Standing balance-Leahy Scale: Good                 High Level Balance Comments: RW required for ambulation for bilat UE support             Cognition Arousal/Alertness: Awake/alert Behavior During Therapy: WFL for tasks assessed/performed Overall Cognitive Status: Within Functional Limits for tasks assessed                                        Exercises Total Joint Exercises Ankle Circles/Pumps: AROM;10 reps;Seated;Both Towel Squeeze: AROM;10 reps;Both;Seated Heel Slides: AAROM;10 reps;Seated;Left Long [employer]: 10 reps;Left;Seated;AROM    General Comments        Pertinent Vitals/Pain Pain Assessment: 0-10 Pain Score: 5  Pain Location: L Knee Pain Descriptors / Indicators: Aching;Sore Pain Intervention(s): Ice applied;Repositioned;Monitored during session;Limited activity within patient's tolerance    Home Living                      Prior Function            PT Goals (current goals can now be found in the care plan section) Progress towards PT goals: Progressing toward  goals    Frequency    7X/week      PT Plan Current plan remains appropriate    Co-evaluation              AM-PAC PT "6 Clicks" Daily Activity  Outcome Measure  Difficulty turning over in bed (including adjusting bedclothes, sheets and blankets)?: None Difficulty moving from lying on back to sitting on the side of the bed? : None Difficulty sitting down on and standing up from a chair with arms (e.g., wheelchair, bedside commode, etc,.)?: A Little Help needed moving to and from a bed  to chair (including a wheelchair)?: A Little Help needed walking in hospital room?: A Little Help needed climbing 3-5 steps with a railing? : A Little 6 Click Score: 20    End of Session Equipment Utilized During Treatment: Gait belt Activity Tolerance: Patient tolerated treatment well Patient left: in chair;with family/visitor present;with call bell/phone within reach Nurse Communication: Mobility status PT Visit Diagnosis: Difficulty in walking, not elsewhere classified (R26.2)     Time: 8648-4720 PT Time Calculation (min) (ACUTE ONLY): 30 min  Charges:  $Gait Training: 8-22 mins $Therapeutic Exercise: 8-22 mins                    G Codes:       Olegario Shearer, SPT   Reino Bellis 10/13/2016, 12:37 PM

## 2016-10-13 NOTE — Progress Notes (Signed)
     Subjective: 4 Days Post-Op Procedure(s) (LRB): Conversion left uni compartment arthroplasty to total knee arthroplasty (Left)   Patient reports pain as mild, pain controlled better with being on Dilaudid PO.  No new events throughout the night.  Continues to have foot drop, though she keeps trying to dorsiflex the foot.  She is hopeful that the dropfoot will resolve.  Ready to be discharged home.  Objective:   VITALS:   Vitals:   10/12/16 2236 10/13/16 0543  BP: (!) 119/59 119/61  Pulse: 91 88  Resp: 16 16  Temp: 99.5 F (37.5 C) 98.2 F (36.8 C)   Drop foot continues Incision: dressing C/D/I No cellulitis present Compartment soft  LABS  Recent Labs  10/11/16 0628  HGB 8.8*  HCT 25.8*  WBC 9.6  PLT 235     Recent Labs  10/11/16 0628  NA 141  K 3.5  BUN 10  CREATININE 0.59  GLUCOSE 115*     Assessment/Plan: 4 Days Post-Op Procedure(s) (LRB): Conversion left uni compartment arthroplasty to total knee arthroplasty (Left) Up with therapy Discharge home Follow up in 2 weeks at [facility]. Follow up with OLIN,Tyrees Chopin D in 2 weeks.  Contact information:  [facility] [address] [place]   PAC  10/13/2016, 8:56 AM

## 2016-10-16 NOTE — Discharge Summary (Signed)
Physician Discharge Summary  Patient ID: Pamela [name] MRN: [id number] DOB/AGE: 62-Jul-1957 62 y.o.  Admit date: 10/09/2016 Discharge date: 10/13/2016   Procedures:  Procedure(s) (LRB): Conversion left uni compartment arthroplasty to total knee arthroplasty (Left)  Attending Physician:  Dr. Paralee Cancel   Admission Diagnoses:   Failed left UKA  Discharge Diagnoses:  Principal Problem:   S/P left TK revision  Past Medical History:  Diagnosis Date  . ALLERGIC RHINITIS 12/18/2006   Qualifier: Diagnosis of  By: [place], [place]    . Anxiety   . Arthritis   . DEPRESSION, SITUATIONAL 03/15/2007   Qualifier: Diagnosis of  By: Wynona Luna   . GERD (gastroesophageal reflux disease)   . Headache    occasional  . History of kidney stones   . HYPERLIPIDEMIA 12/18/2006   Qualifier: History of  By: Danny Lawless CMA, [place]    . Hypertension   . HYPERTENSION 12/18/2006   Qualifier: Diagnosis of  By: Danny Lawless CMA, [place]    . INSOMNIA-SLEEP DISORDER-UNSPEC 08/25/2009   Qualifier: Diagnosis of  By: Jenny Reichmann MD, Hunt Oris   . Pneumonia   . RENAL CALCULUS, HX OF 12/18/2006   Qualifier: Diagnosis of  By: Danny Lawless CMA, [place]      HPI:    Pamela [name], 61 y.o. female, has a history of pain and functional disability in the left knee(s) due to failed previous arthroplasty and patient has failed non-surgical conservative treatments for greater than 12 weeks to include NSAID's and/or analgesics, corticosteriod injections, use of assistive devices and activity modification. 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 Onset of symptoms was gradual starting ~6 months ago with gradually worsening course since that time.  Prior procedures on the left knee(s) include unicompartmental arthroplasty. Patient currently rates pain in the left knee(s) at 10 out of 10 with activity. There is night pain, worsening of pain with activity and  weight bearing, pain that interferes with activities of daily living, pain with passive range of motion, crepitus and joint swelling.  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 This condition presents safety issues increasing the risk of falls.   There is no current active infection.  Risks, benefits and expectations were discussed with the patient.  Risks including but not limited to the risk of anesthesia, blood clots, nerve damage, blood vessel damage, failure of the prosthesis, infection and up to and including death.  Patient understand the risks, benefits and expectations and wishes to proceed with surgery.   PCP: Biagio Borg, MD   Discharged Condition: good  Hospital Course:  Patient underwent the above stated procedure on 10/09/2016. Patient tolerated the procedure well and brought to the recovery room in good condition and subsequently to the floor.  POD #1 BP: 124/70 ; Pulse: 79 ; Temp: 98.6 F (37 C) ; Resp: 16 Patient reports pain as moderate.  Up ordering breakfast this am, husband in room.  No events. Neurovascular intact and incision: dressing C/D/I.  LABS  Basename    HGB     9.3  HCT     27.2   POD #2  BP: 129/67 ; Pulse: 94 ; Temp: 98.3 F (36.8 C) ; Resp: 16 Patient reports pain as moderate, not fully controlled.  Complaining of more foot pain and foot drop. No events otherwise throughout the night. Incision: dressing C/D/I, no cellulitis present,  compartment soft and left foot drop with numbness on the plantar surface of the foot.  LABS  Basename    HGB     8.8  HCT     25.8   POD #3  BP: 136/74 ; Pulse: 111 ; Temp: 99.3 F (37.4 C) ; Resp: 16 Patient reports pain as moderate.  Feels that she is slowly improve.  Reviewed findings of left peroneal dysfunction.  AFO brace ordered and in room. Neurovascular intact. incision: dressing C/D/I - ACE wrap removed today, left foot no active dorsiflexion and does  report intact sensibility on dorsum of her foot, better than yesterday.  LABS   No new labs   Discharge Exam: General appearance: alert, cooperative and no distress Extremities: Homans sign is negative, no sign of DVT, no edema, redness or tenderness in the calves or thighs and no ulcers, gangrene or trophic changes  Disposition: Home with follow up in 2 weeks   Follow-up Information    Paralee Cancel, MD. Schedule an appointment as soon as possible for a visit in 2 week(s).   Specialty:  Orthopedic Surgery Contact information: [address] [place] [address] [phone]           Discharge Instructions    Call MD / Call 911    Complete by:  As directed    If you experience chest pain or shortness of breath, CALL 911 and be transported to the hospital emergency room.  If you develope a fever above 101 F, pus (white drainage) or increased drainage or redness at the wound, or calf pain, call your surgeon's office.   Change dressing    Complete by:  As directed    Maintain surgical dressing until follow up in the clinic. If the edges start to pull up, may reinforce with tape. If the dressing is no longer working, may remove and cover with gauze and tape, but must keep the area dry and clean.  Call with any questions or concerns.   Constipation Prevention    Complete by:  As directed    Drink plenty of fluids.  Prune juice may be helpful.  You may use a stool softener, such as Colace (over the counter) 100 mg twice a day.  Use MiraLax (over the counter) for constipation as needed.   Diet - low sodium heart healthy    Complete by:  As directed    Discharge instructions    Complete by:  As directed    Maintain surgical dressing until follow up in the clinic. If the edges start to pull up, may reinforce with tape. If the dressing is no longer working, may remove and cover with gauze and tape, but must keep the area dry and clean.  Follow up in 2 weeks at  [facility]. Call with any questions or concerns.   Increase activity slowly as tolerated    Complete by:  As directed    Weight bearing as tolerated with assist device (walker, cane, etc) as directed, use it as long as suggested by your surgeon or therapist, typically at least 4-6 weeks.  Use AFO brace to help with safety with ambulation.   TED hose    Complete by:  As directed    Use stockings (TED hose) for 2 weeks on both leg(s).  You may remove them at night for sleeping.      Allergies as of 10/13/2016   No Known Allergies     Medication List  STOP taking these medications   aspirin-sod bicarb-citric acid 325 MG Tbef tablet Commonly known as:  ALKA-SELTZER   diclofenac 75 MG EC tablet Commonly known as:  VOLTAREN   diclofenac sodium 1 % Gel Commonly known as:  VOLTAREN   HYDROcodone-acetaminophen 7.5-325 MG tablet Commonly known as:  NORCO   traMADol 50 MG tablet Commonly known as:  ULTRAM     TAKE these medications   ALPRAZolam 0.25 MG tablet Commonly known as:  XANAX take 1 tablet by mouth twice a day if needed What changed:  See the new instructions.   aspirin 81 MG chewable tablet Chew 1 tablet (81 mg total) by mouth 2 (two) times daily. Take for 4 weeks. Notes to patient:  Blood clot prevention after surgery   atorvastatin 20 MG tablet Commonly known as:  LIPITOR TAKE 1 TABLET (20 MG TOTAL) BY MOUTH DAILY.   butalbital-acetaminophen-caffeine 50-325-40 MG tablet Commonly known as:  FIORICET, ESGIC TAKE 1 TABLET BY MOUTH TWICE DAILY   celecoxib 200 MG capsule Commonly known as:  CELEBREX Take 1 capsule (200 mg total) by mouth every 12 (twelve) hours.   cholecalciferol 1000 units tablet Commonly known as:  VITAMIN D Take 1,000 Units by mouth daily.   cyclobenzaprine 5 MG tablet Commonly known as:  FLEXERIL take 1 tablet by mouth three times a day if needed for muscle spasms Notes to patient:  Muscle relaxer   docusate sodium 100 MG  capsule Commonly known as:  COLACE Take 1 capsule (100 mg total) by mouth 2 (two) times daily. Notes to patient:  Stool softener   ferrous sulfate 325 (65 FE) MG tablet Take 1 tablet (325 mg total) by mouth 3 (three) times daily after meals. Notes to patient:  iron   HYDROmorphone 2 MG tablet Commonly known as:  DILAUDID Take 1-2 tablets (2-4 mg total) by mouth every 4 (four) hours as needed for severe pain.   losartan-hydrochlorothiazide 50-12.5 MG tablet Commonly known as:  HYZAAR Take 1 tablet by mouth daily. Yearly physical w/labs is due must see md for refills   metoCLOPramide 10 MG tablet Commonly known as:  REGLAN take 1 tablet by mouth at bedtime   polyethylene glycol packet Commonly known as:  MIRALAX / GLYCOLAX Take 17 g by mouth 2 (two) times daily. Notes to patient:  Laxative powder; available over the counter   TIGER BALM MUSCLE RUB EX Apply 1 application topically daily as needed (pain).   zolpidem 10 MG tablet Commonly known as:  AMBIEN take 1 tablet by mouth at bedtime if needed for sleep What changed:  See the new instructions.        Signed: [place] P[place]. Zyliah Schier   PA-C  10/16/2016, 12:10 PM

## 2016-10-18 DIAGNOSIS — M1712 Unilateral primary osteoarthritis, left knee: Secondary | ICD-10-CM | POA: Diagnosis not present

## 2016-10-25 ENCOUNTER — Other Ambulatory Visit: Payer: Self-pay | Admitting: Internal Medicine

## 2016-10-25 DIAGNOSIS — M1712 Unilateral primary osteoarthritis, left knee: Secondary | ICD-10-CM | POA: Diagnosis not present

## 2016-10-26 NOTE — Telephone Encounter (Signed)
Faxed

## 2016-10-26 NOTE — Telephone Encounter (Signed)
Done hardcopy to [place]  

## 2016-10-27 DIAGNOSIS — M1712 Unilateral primary osteoarthritis, left knee: Secondary | ICD-10-CM | POA: Diagnosis not present

## 2016-11-03 DIAGNOSIS — M1712 Unilateral primary osteoarthritis, left knee: Secondary | ICD-10-CM | POA: Diagnosis not present

## 2016-11-05 ENCOUNTER — Other Ambulatory Visit: Payer: Self-pay | Admitting: Gastroenterology

## 2016-11-07 ENCOUNTER — Other Ambulatory Visit: Payer: Self-pay | Admitting: Internal Medicine

## 2016-11-08 DIAGNOSIS — M1712 Unilateral primary osteoarthritis, left knee: Secondary | ICD-10-CM | POA: Diagnosis not present

## 2016-11-10 ENCOUNTER — Other Ambulatory Visit: Payer: Self-pay | Admitting: Internal Medicine

## 2016-11-10 DIAGNOSIS — M1712 Unilateral primary osteoarthritis, left knee: Secondary | ICD-10-CM | POA: Diagnosis not present

## 2016-11-14 NOTE — Telephone Encounter (Signed)
faxed

## 2016-11-14 NOTE — Telephone Encounter (Signed)
Done hardcopy to [place]  

## 2016-11-29 DIAGNOSIS — Z96652 Presence of left artificial knee joint: Secondary | ICD-10-CM | POA: Diagnosis not present

## 2016-11-29 DIAGNOSIS — Z471 Aftercare following joint replacement surgery: Secondary | ICD-10-CM | POA: Diagnosis not present

## 2016-11-29 DIAGNOSIS — Z09 Encounter for follow-up examination after completed treatment for conditions other than malignant neoplasm: Secondary | ICD-10-CM | POA: Diagnosis not present

## 2016-11-29 DIAGNOSIS — M25561 Pain in right knee: Secondary | ICD-10-CM | POA: Diagnosis not present

## 2016-12-06 DIAGNOSIS — M1712 Unilateral primary osteoarthritis, left knee: Secondary | ICD-10-CM | POA: Diagnosis not present

## 2016-12-11 ENCOUNTER — Telehealth: Payer: Self-pay | Admitting: Internal Medicine

## 2016-12-11 NOTE — Telephone Encounter (Signed)
Done hardcopy to [place]  

## 2016-12-12 NOTE — Telephone Encounter (Signed)
faxed

## 2016-12-14 DIAGNOSIS — M1712 Unilateral primary osteoarthritis, left knee: Secondary | ICD-10-CM | POA: Diagnosis not present

## 2016-12-18 NOTE — Telephone Encounter (Signed)
Pt called stating that this medication is not helping her knee pain. Please advise.

## 2016-12-18 NOTE — Telephone Encounter (Signed)
Very sorry, I do not treat chronic pain with schedule II or higher narcotics  I can do a tramadol refill  If needed, otherwise would need to f/u with ortho

## 2016-12-18 NOTE — Telephone Encounter (Signed)
Called pt, LVM.   

## 2016-12-20 ENCOUNTER — Telehealth: Payer: Self-pay | Admitting: Gastroenterology

## 2016-12-20 NOTE — Telephone Encounter (Signed)
Left message on machine to call back  

## 2016-12-25 ENCOUNTER — Other Ambulatory Visit: Payer: Self-pay | Admitting: Gastroenterology

## 2016-12-25 ENCOUNTER — Other Ambulatory Visit: Payer: Self-pay

## 2016-12-25 MED ORDER — PANTOPRAZOLE SODIUM 40 MG PO TBEC: 40.0000 mg | DELAYED_RELEASE_TABLET | Freq: Every day | ORAL | 1 refills | Status: DC

## 2016-12-25 NOTE — Telephone Encounter (Signed)
Left message on machine to call back  

## 2016-12-25 NOTE — Telephone Encounter (Signed)
Unable to reach pt will wait for further communication  

## 2016-12-27 ENCOUNTER — Other Ambulatory Visit: Payer: Self-pay | Admitting: Internal Medicine

## 2017-01-10 DIAGNOSIS — Z471 Aftercare following joint replacement surgery: Secondary | ICD-10-CM | POA: Diagnosis not present

## 2017-01-10 DIAGNOSIS — M25561 Pain in right knee: Secondary | ICD-10-CM | POA: Diagnosis not present

## 2017-01-10 DIAGNOSIS — Z96652 Presence of left artificial knee joint: Secondary | ICD-10-CM | POA: Diagnosis not present

## 2017-02-07 ENCOUNTER — Other Ambulatory Visit: Payer: Self-pay | Admitting: Internal Medicine

## 2017-02-08 ENCOUNTER — Other Ambulatory Visit: Payer: Self-pay | Admitting: Internal Medicine

## 2017-02-08 NOTE — Telephone Encounter (Signed)
Done erx 

## 2017-02-21 ENCOUNTER — Other Ambulatory Visit: Payer: Self-pay | Admitting: Internal Medicine

## 2017-02-21 NOTE — Telephone Encounter (Signed)
fioricet done erx

## 2017-02-24 ENCOUNTER — Other Ambulatory Visit: Payer: Self-pay | Admitting: Internal Medicine

## 2017-02-26 MED ORDER — TRAMADOL HCL 50 MG PO TABS: ORAL_TABLET | ORAL | 2 refills | Status: DC

## 2017-02-26 NOTE — Telephone Encounter (Signed)
Tramadol done erx to [facility] [address]

## 2017-02-27 ENCOUNTER — Other Ambulatory Visit: Payer: Self-pay

## 2017-03-02 ENCOUNTER — Telehealth: Payer: Self-pay | Admitting: Gastroenterology

## 2017-03-02 ENCOUNTER — Ambulatory Visit: Payer: Medicare HMO | Admitting: Gastroenterology

## 2017-03-05 ENCOUNTER — Other Ambulatory Visit: Payer: Self-pay | Admitting: Gastroenterology

## 2017-03-07 NOTE — Telephone Encounter (Signed)
The pt has been advised that she needs to call back and make an appt for further refills

## 2017-03-07 NOTE — Telephone Encounter (Signed)
Patient calling back regarding medication refill asking if it has been sent to pharmacy.

## 2017-03-08 ENCOUNTER — Telehealth: Payer: Self-pay | Admitting: Gastroenterology

## 2017-03-08 MED ORDER — METOCLOPRAMIDE HCL 10 MG PO TABS: 10.0000 mg | ORAL_TABLET | Freq: Every day | ORAL | 1 refills | Status: DC

## 2017-03-08 NOTE — Telephone Encounter (Signed)
Medication refilled until appt in Feb.

## 2017-03-26 ENCOUNTER — Encounter: Payer: Medicare HMO | Admitting: Internal Medicine

## 2017-04-03 ENCOUNTER — Encounter: Payer: Medicare HMO | Admitting: Internal Medicine

## 2017-04-03 DIAGNOSIS — Z0289 Encounter for other administrative examinations: Secondary | ICD-10-CM

## 2017-04-10 ENCOUNTER — Other Ambulatory Visit: Payer: Self-pay | Admitting: Internal Medicine

## 2017-04-11 NOTE — Telephone Encounter (Signed)
Done erx 

## 2017-04-13 ENCOUNTER — Ambulatory Visit (INDEPENDENT_AMBULATORY_CARE_PROVIDER_SITE_OTHER): Payer: Medicare HMO | Admitting: Internal Medicine

## 2017-04-13 ENCOUNTER — Ambulatory Visit: Payer: Medicare HMO | Admitting: Internal Medicine

## 2017-04-13 ENCOUNTER — Encounter: Payer: Self-pay | Admitting: Internal Medicine

## 2017-04-13 ENCOUNTER — Ambulatory Visit (HOSPITAL_COMMUNITY)
Admission: RE | Admit: 2017-04-13 | Discharge: 2017-04-13 | Disposition: A | Discharge status: Home / Self Care | Payer: Medicare HMO | Source: Ambulatory Visit | Attending: Internal Medicine | Admitting: Internal Medicine

## 2017-04-13 VITALS — BP 130/88 | HR 89 | Temp 98.8°F | Ht 69.0 in | Wt 207.0 lb

## 2017-04-13 DIAGNOSIS — I10 Essential (primary) hypertension: Secondary | ICD-10-CM

## 2017-04-13 DIAGNOSIS — M7989 Other specified soft tissue disorders: Secondary | ICD-10-CM

## 2017-04-13 DIAGNOSIS — M79661 Pain in right lower leg: Secondary | ICD-10-CM | POA: Diagnosis not present

## 2017-04-13 DIAGNOSIS — M79662 Pain in left lower leg: Secondary | ICD-10-CM | POA: Insufficient documentation

## 2017-04-13 MED ORDER — HYDROCODONE-ACETAMINOPHEN 5-325 MG PO TABS: 1.0000 | ORAL_TABLET | Freq: Four times a day (QID) | ORAL | 0 refills | Status: DC | PRN

## 2017-04-13 NOTE — Progress Notes (Signed)
Subjective:    Patient ID: Pamela [name], female    DOB: 02-24-1956, 62 y.o.   MRN: [id number]  HPI  Here with acute onset 5 days post knee and now primarily sharp post calf pain and swelling, constant, moderate, worse to walk, better to sit, has seen Dr Arlington Calix in past for baker cyst with tx, but not recently.  No hx of DVT.  Pt denies chest pain, increased sob or doe, wheezing, orthopnea, PND, increased LE swelling, palpitations, dizziness or syncope.  Pt denies new neurological symptoms such as new headache, or facial or extremity weakness or numbness   Pt denies polydipsia, polyuria Past Medical History:  Diagnosis Date  . ALLERGIC RHINITIS 12/18/2006   Qualifier: Diagnosis of  By: [place], [place]    . Anxiety   . Arthritis   . DEPRESSION, SITUATIONAL 03/15/2007   Qualifier: Diagnosis of  By: Wynona Luna   . GERD (gastroesophageal reflux disease)   . Headache    occasional  . History of kidney stones   . HYPERLIPIDEMIA 12/18/2006   Qualifier: History of  By: Danny Lawless CMA, [place]    . Hypertension   . HYPERTENSION 12/18/2006   Qualifier: Diagnosis of  By: Danny Lawless CMA, [place]    . INSOMNIA-SLEEP DISORDER-UNSPEC 08/25/2009   Qualifier: Diagnosis of  By: Jenny Reichmann MD, Hunt Oris   . Pneumonia   . RENAL CALCULUS, HX OF 12/18/2006   Qualifier: Diagnosis of  By: [place], [place]     Past Surgical History:  Procedure Laterality Date  . CERVICAL DISC SURGERY     x 3  . CONVERSION TO TOTAL KNEE Left 10/09/2016   Procedure: Conversion left uni compartment arthroplasty to total knee arthroplasty;  Surgeon: Paralee Cancel, MD;  Location: [facility];  Service: Orthopedics;  Laterality: Left;  90 mins  . KNEE ARTHROSCOPY     partial  . total left knee arthoplasty     10/09/16 Dr. Alvan Dame    reports that she has quit smoking. she has never used smokeless tobacco. She reports that she does not drink alcohol or use drugs. family history includes Heart disease in her father; Lung cancer in her  brother and sister; Stroke in her mother. No Known Allergies Current Outpatient Medications on File Prior to Visit  Medication Sig Dispense Refill  . ALPRAZolam (XANAX) 0.25 MG tablet take 1 tablet by mouth twice a day if needed 60 tablet 5  . aspirin 81 MG chewable tablet Chew 1 tablet (81 mg total) by mouth 2 (two) times daily. Take for 4 weeks. 60 tablet 0  . atorvastatin (LIPITOR) 20 MG tablet TAKE 1 TABLET (20 MG TOTAL) BY MOUTH DAILY. 90 tablet 0  . butalbital-acetaminophen-caffeine (FIORICET, ESGIC) 50-325-40 MG tablet take 1 tablet by mouth twice a day 60 tablet 0  . Camphor-Menthol-Methyl Sal (TIGER BALM MUSCLE RUB EX) Apply 1 application topically daily as needed (pain).    . celecoxib (CELEBREX) 200 MG capsule Take 1 capsule (200 mg total) by mouth every 12 (twelve) hours. 60 capsule 0  . cholecalciferol (VITAMIN D) 1000 units tablet Take 1,000 Units by mouth daily.    . cyclobenzaprine (FLEXERIL) 5 MG tablet take 1 tablet by mouth three times a day if needed for muscle spasms 30 tablet 0  . docusate sodium (COLACE) 100 MG capsule Take 1 capsule (100 mg total) by mouth 2 (two) times daily. 10 capsule 0  . DULoxetine (CYMBALTA) 60 MG capsule take 1 capsule by mouth once daily  90 capsule 2  . ferrous sulfate 325 (65 FE) MG tablet Take 1 tablet (325 mg total) by mouth 3 (three) times daily after meals.  3  . losartan-hydrochlorothiazide (HYZAAR) 50-12.5 MG tablet Take 1 tablet by mouth daily. Yearly physical w/labs is due must see md for refills 90 tablet 0  . metoCLOPramide (REGLAN) 10 MG tablet Take 1 tablet (10 mg total) by mouth at bedtime. 30 tablet 1  . pantoprazole (PROTONIX) 40 MG tablet take 1 tablet by mouth once daily 90 tablet 1  . polyethylene glycol (MIRALAX / GLYCOLAX) packet Take 17 g by mouth 2 (two) times daily. 14 each 0  . traMADol (ULTRAM) 50 MG tablet take 1 tablet by mouth every 6 hours if needed for pain 120 tablet 2  . zolpidem (AMBIEN) 10 MG tablet take 1 tablet  by mouth at bedtime for sleep 90 tablet 0   No current facility-administered medications on file prior to visit.    Review of Systems  Constitutional: Negative for other unusual diaphoresis or sweats HENT: Negative for ear discharge or swelling Eyes: Negative for other worsening visual disturbances Respiratory: Negative for stridor or other swelling  Gastrointestinal: Negative for worsening distension or other blood Genitourinary: Negative for retention or other urinary change Musculoskeletal: Negative for other MSK pain or swelling Skin: Negative for color change or other new lesions Neurological: Negative for worsening tremors and other numbness  Psychiatric/Behavioral: Negative for worsening agitation or other fatigue All other system neg per pt    Objective:   Physical Exam BP 130/88   Pulse 89   Temp 98.8 F (37.1 C) (Oral)   Ht 5\' 9"  (1.753 m)   Wt 207 lb (93.9 kg)   SpO2 96%   BMI 30.57 kg/m  VS noted,  Constitutional: Pt appears in NAD HENT: Head: NCAT.  Right Ear: External ear normal.  Left Ear: External ear normal.  Eyes: . Pupils are equal, round, and reactive to light. Conjunctivae and EOM are normal Nose: without d/c or deformity Neck: Neck supple. Gross normal ROM Cardiovascular: Normal rate and regular rhythm.   Pulmonary/Chest: Effort normal and breath sounds without rales or wheezing.  Right knee with marked lateral heat and slight swelling; post knee without swelling but mild tender; moderate tender noted to 1-2+ swelling calf and distal RLE, o/w neurovasc intact Neurological: Pt is alert. At baseline orientation, motor grossly intact Skin: Skin is warm. No rashes, other new lesions, no LE edema Psychiatric: Pt behavior is normal without agitation  No other exam findings    Assessment & Plan:

## 2017-04-13 NOTE — Patient Instructions (Addendum)
Please take all new medication as prescribed - the vicodin for pain  Please do not take the tramadol when taking the vicodin  You will be contacted regarding the referral for: Right leg vein test (now)  Please continue all other medications as before, and refills have been done if requested.  Please have the pharmacy call with any other refills you may need.  Please keep your appointments with your specialists as you may have planned

## 2017-04-13 NOTE — Progress Notes (Signed)
Right lower extremity venous duplex has been completed. Negative for DVT. An anechoic area is noted in the proximal to mid calf, suggestive of a possible ruptured Baker's cyst vs unknown etiology. Results were given to Shirron at Dr. Gwynn Burly office.  04/13/17 4:28 PM Pamela [name] RVT

## 2017-04-14 ENCOUNTER — Other Ambulatory Visit: Payer: Self-pay | Admitting: Internal Medicine

## 2017-04-14 ENCOUNTER — Encounter: Payer: Self-pay | Admitting: Internal Medicine

## 2017-04-14 DIAGNOSIS — M660 Rupture of popliteal cyst: Secondary | ICD-10-CM

## 2017-04-14 NOTE — Assessment & Plan Note (Signed)
I suspect ruptured bakers cyst, for u/s as above, may need sport med or ortho f/u

## 2017-04-14 NOTE — Assessment & Plan Note (Signed)
Cannot rule out DVT - for stat venous dopplers,  to f/u any worsening symptoms or concerns

## 2017-04-14 NOTE — Assessment & Plan Note (Signed)
stable overall by history and exam, recent data reviewed with pt, and pt to continue medical treatment as before,  to f/u any worsening symptoms or concerns BP Readings from Last 3 Encounters:  04/13/17 130/88  10/13/16 119/61  10/03/16 129/76

## 2017-04-16 ENCOUNTER — Telehealth: Payer: Self-pay

## 2017-04-16 ENCOUNTER — Telehealth: Payer: Self-pay | Admitting: Internal Medicine

## 2017-04-16 NOTE — Telephone Encounter (Signed)
Copied from Bud [id number]. Topic: Inquiry >> Apr 16, 2017 11:24 AM Neva Seat wrote: Pt needing to know if anything else was prescribed for her knee pain she was recently seen for?  She has Rx but not getting rid of the pain.

## 2017-04-16 NOTE — Telephone Encounter (Signed)
Pt has been informed and expressed understanding.  

## 2017-04-16 NOTE — Telephone Encounter (Signed)
-----   Message from Biagio Borg, MD sent at 04/14/2017  2:43 PM EST ----- Left message on MyChart, pt to cont same tx except  The test results show that your current treatment is OK, except the test does show a possible ruptured Bakers Cyst as we suspected.  Please continue the same treatment, and we will need to refer you to Sports Medicine in this office for follow up.    Pamela [name] to please inform pt, I will do referral

## 2017-04-16 NOTE — Telephone Encounter (Signed)
I could not feel comfortable with further narcotic as she already has hydrocodone, celebrex and even tramadol for pain  I have referred to orthopedic

## 2017-04-16 NOTE — Telephone Encounter (Signed)
Called pt, LVM.   CRM created.  

## 2017-04-17 ENCOUNTER — Encounter: Payer: Self-pay | Admitting: Family Medicine

## 2017-04-17 ENCOUNTER — Ambulatory Visit (INDEPENDENT_AMBULATORY_CARE_PROVIDER_SITE_OTHER): Payer: Medicare HMO | Admitting: Family Medicine

## 2017-04-17 VITALS — BP 132/74 | HR 77 | Temp 98.8°F | Ht 69.0 in | Wt 206.0 lb

## 2017-04-17 DIAGNOSIS — M25561 Pain in right knee: Secondary | ICD-10-CM

## 2017-04-17 MED ORDER — NAPROXEN 500 MG PO TABS: 500.0000 mg | ORAL_TABLET | Freq: Two times a day (BID) | ORAL | 0 refills | Status: DC

## 2017-04-17 NOTE — Patient Instructions (Signed)
Take tylenol 650 mg three times a day is the best evidence based medicine we have for arthritis.  Glucosamine sulfate 750mg  twice a day is a supplement that has been shown to help moderate to severe arthritis. Vitamin D 2000 IU daily Fish oil 2 grams daily.  Tumeric 500mg  twice daily.  Capsaicin topically up to four times a day may also help with pain. Cortisone injections are an option if these interventions do not seem to make a difference or need more relief.  If cortisone injections do not help, there are different types of shots that may help but they take longer to take effect.  We can discuss this at follow up.  It's important that you continue to stay active. Controlling your weight is important.  Consider physical therapy to strengthen muscles around the joint that hurts to take pressure off of the joint itself. Shoe inserts with good arch support may be helpful.  [employer] orthotics at [employer] could help.  Water aerobics and cycling with low resistance are the best two types of exercise for arthritis.  Come back and see me in 4-6 weeks if no improvement .

## 2017-04-17 NOTE — Assessment & Plan Note (Signed)
Has findings consistent with arthritis but also sounds like a component of patellofemoral syndrome. - Ace wrap today - Initiate naproxen - Counseled on home exercise therapy - If no improvement can try aspiration & injection as well as update x-rays.

## 2017-04-17 NOTE — Progress Notes (Signed)
Pamela [name] - 62 y.o. female MRN [id number]  Date of birth: 1955-08-18  SUBJECTIVE:  Including CC & ROS.  Chief Complaint  Patient presents with  . Knee Pain    Pamela [name] is a 62 y.o. female that is presenting with right knee pain. Ongoing for two weeks, Admits to swelling and tender. Denies previous trauma or injury. Severe pain constant. Pain when walking and bending knee. Pain is located posterior and anterior aspect of her right knee. She has been elevating her knee and applying ice with no improvement. Received a knee injection 2 months ago.   Review of venous duplex on 2/1 shows findings suggestive of ruptured baker's cyst vs unknown etiology.   Independent review on right knee xrays from 2013 show medial joint space narrowing.    Review of Systems  Constitutional: Negative for fever.  Respiratory: Negative for choking.   Cardiovascular: Negative for chest pain.  Gastrointestinal: Negative for abdominal pain.  Musculoskeletal: Positive for arthralgias.  Skin: Negative for color change.  Neurological: Negative for weakness.  Hematological: Negative for adenopathy.  Psychiatric/Behavioral: Negative for agitation.    HISTORY: Past Medical, Surgical, Social, and Family History Reviewed & Updated per EMR.   Pertinent Historical Findings include:  Past Medical History:  Diagnosis Date  . ALLERGIC RHINITIS 12/18/2006   Qualifier: Diagnosis of  By: Pamela [name], Pamela [name]    . Anxiety   . Arthritis   . DEPRESSION, SITUATIONAL 03/15/2007   Qualifier: Diagnosis of  By: Pamela [name]   . GERD (gastroesophageal reflux disease)   . Headache    occasional  . History of kidney stones   . HYPERLIPIDEMIA 12/18/2006   Qualifier: History of  By: Pamela [name] CMA, Pamela [name]    . Hypertension   . HYPERTENSION 12/18/2006   Qualifier: Diagnosis of  By: Pamela [name] CMA, Pamela [name]    . INSOMNIA-SLEEP DISORDER-UNSPEC 08/25/2009   Qualifier: Diagnosis of  By: Jenny Reichmann [name], Hunt Oris   . Pneumonia   . RENAL  CALCULUS, HX OF 12/18/2006   Qualifier: Diagnosis of  By: Pamela [name], Pamela [name]      Past Surgical History:  Procedure Laterality Date  . CERVICAL DISC SURGERY     x 3  . 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 KNEE ARTHROSCOPY     partial  . total left knee arthoplasty     10/09/16 Dr. Alvan [name]    No Known Allergies  Family History  Problem Relation Age of Onset  . Stroke Mother   . Heart disease Father   . Lung cancer Sister   . Lung cancer Brother      Social History   Socioeconomic History  . Marital status: Married    Spouse name: Not on file  . Number of children: 3  . Years of education: Not on file  . Highest education level: Not on file  Social Needs  . Financial resource strain: Not on file  . Food insecurity - worry: Not on file  . Food insecurity - inability: Not on file  . Transportation needs - medical: Not on file  . Transportation needs - non-medical: Not on file  Occupational History  . Not on file  Tobacco Use  . Smoking status: Former Research scientist (life sciences)  . Smokeless tobacco: Never Used  . Tobacco comment: 1970's  Substance and Sexual Activity  . Alcohol use: No    Alcohol/week: 0.0 oz  . Drug use: No  . Sexual activity: Yes  Other Topics Concern  . Not on file  Social History Narrative  . Not on file     PHYSICAL EXAM:  VS: BP 132/74 (BP Location: Left Arm, Patient Position: Sitting, Cuff Size: Normal)   Pulse 77   Temp 98.8 F (37.1 C) (Oral)   Ht 5\' 9"  (1.753 m)   Wt 206 lb (93.4 kg)   SpO2 100%   BMI 30.42 kg/m  Physical Exam Gen: NAD, alert, cooperative with exam, well-appearing ENT: normal lips, normal nasal mucosa,  Eye: normal EOM, normal conjunctiva and lids CV:  no edema, +2 pedal pulses   Resp: no accessory muscle use, non-labored,  Skin: no rashes,  no areas of induration  Neuro: normal tone, normal sensation to touch Psych:  normal insight, alert and oriented MSK:  Right Knee: Normal to inspection with no erythema or obvious bony abnormalities. Effusion present Medial joint line tenderness Palpation normal with no warmth,  patellar tenderness, or condyle tenderness. ROM full in flexion and extension and lower leg rotation. Ligaments with solid consistent endpoints including LCL, MCL. Negative Mcmurray's, Non painful patellar compression. Patellar glide without crepitus. Patellar and quadriceps tendons unremarkable. Hamstring and quadriceps strength is normal.  Neurovascularly intact   Limited ultrasound: right knee:  Moderate effusion  Narrowing on medial joint line  Small baker's cyst Findings of soft tissue swelling superficial to the lateral gastroc  Summary: effusion and degenerative changes  Ultrasound and interpretation by Pamela Coots, [name]            ASSESSMENT & PLAN:   Acute pain of right knee Has findings consistent with arthritis but also sounds like a component of patellofemoral syndrome. - Ace wrap today - Initiate naproxen - Counseled on home exercise therapy - If no improvement can try aspiration & injection as well as update x-rays.

## 2017-04-18 ENCOUNTER — Telehealth: Payer: Self-pay | Admitting: *Deleted

## 2017-04-18 ENCOUNTER — Other Ambulatory Visit: Payer: Self-pay | Admitting: Internal Medicine

## 2017-04-18 NOTE — Telephone Encounter (Signed)
Copied from [place] [id number]. Topic: General - Other >> Apr 18, 2017  8:40 AM Carolyn Stare wrote:  Pt call to say she saw the doctor on 04/27/17 and they discuss when she had her last injection ,she wanted him to know that it was late Oct 2018 and is asking if it to early for her to have another one   [phone]   >> Apr 18, 2017  9:18 AM Morphies, Isidoro Donning wrote: This patient saw Dr Raeford Razor yesterday.. Please advise.

## 2017-04-19 NOTE — Telephone Encounter (Signed)
Instructed patient per Dr. Thompson Caul reccomendation to make appointment to have her knee reevaluated under doppler at ER or get an appointment with Dr. Paulla Fore at [facility], due to Dr. Doristine Locks absence. Patient verbalized understanding and will make an appointment.    Copied from [place]. Topic: General - Other >> Apr 19, 2017 12:37 PM Yvette Rack wrote: Reason for CRM: patient had saw Raeford Razor on 04-17-17 for a backers cyst and her knee is still swollen and in pain she would like a cortisone patch it helps with her knee please call into her pharmacy [facility] [place], [place] [phone] (Phone) [phone] (Fax

## 2017-04-23 ENCOUNTER — Other Ambulatory Visit: Payer: Self-pay | Admitting: *Deleted

## 2017-04-23 DIAGNOSIS — G8929 Other chronic pain: Secondary | ICD-10-CM

## 2017-04-23 DIAGNOSIS — M25562 Pain in left knee: Principal | ICD-10-CM

## 2017-04-23 DIAGNOSIS — M25561 Pain in right knee: Principal | ICD-10-CM

## 2017-04-25 ENCOUNTER — Ambulatory Visit (INDEPENDENT_AMBULATORY_CARE_PROVIDER_SITE_OTHER): Payer: Medicare HMO | Admitting: Family

## 2017-04-25 ENCOUNTER — Ambulatory Visit (INDEPENDENT_AMBULATORY_CARE_PROVIDER_SITE_OTHER): Payer: Self-pay

## 2017-04-25 ENCOUNTER — Encounter (INDEPENDENT_AMBULATORY_CARE_PROVIDER_SITE_OTHER): Payer: Self-pay | Admitting: Family

## 2017-04-25 VITALS — Ht 69.0 in | Wt 206.0 lb

## 2017-04-25 DIAGNOSIS — M1711 Unilateral primary osteoarthritis, right knee: Secondary | ICD-10-CM | POA: Diagnosis not present

## 2017-04-25 DIAGNOSIS — M25561 Pain in right knee: Secondary | ICD-10-CM

## 2017-04-25 MED ORDER — LIDOCAINE HCL 1 % IJ SOLN
5.0000 mL | INTRAMUSCULAR | Status: AC | PRN
  Administered 2017-04-25: 5 mL

## 2017-04-25 MED ORDER — METHYLPREDNISOLONE ACETATE 40 MG/ML IJ SUSP
40.0000 mg | INTRAMUSCULAR | Status: AC | PRN
  Administered 2017-04-25: 40 mg via INTRA_ARTICULAR

## 2017-04-25 NOTE — Progress Notes (Signed)
Office Visit Note   Patient: Pamela [name]           Date of Birth: December 31, 1955           MRN: [id number] Visit Date: 04/25/2017              Requested by: Rosemarie Ax, MD [address] [place], [place] [address] PCP: Biagio Borg, MD  Chief Complaint  Patient presents with  . Right Knee - Pain, Edema      HPI: The patient is a 62 y.o. female that is presenting with right knee pain. Was seen for same last week by Dr. Raeford Razor. Has had ongoing symptoms for last three weeks. Admits to swelling and tenderness. Denies previous trauma or injury. Severe pain constant. Pain when walking and bending knee. Pain is located posterior and anterior aspect of her right knee. She has been elevating her knee and applying ice with no improvement. Wearing a knee sleeve with minimal relief from this. Received a depomedrol injection in October of last year, provided relief until return of symptoms in January.   Review of venous duplex on 2/1 shows findings suggestive of ruptured baker's cyst vs unknown etiology.   Right knee xrays from 2013 show medial joint space narrowing.   Assessment & Plan: Visit Diagnoses:  1. Acute pain of right knee   2. Primary osteoarthritis of right knee     Plan: Aspiration and injection today. Will continue with conservative measures as well. Follow up in office in 4 weeks if no improvement.  Follow-Up Instructions: Return in about 4 weeks (around 05/23/2017), or if symptoms worsen or fail to improve.   Right Knee Exam   Muscle Strength  The patient has normal right knee strength.  Tenderness  The patient is experiencing tenderness in the medial joint line and lateral joint line.  Range of Motion  The patient has normal right knee ROM.  Tests  Varus: negative Valgus: negative  Other  Erythema: absent Swelling: moderate Effusion: effusion present  Comments:  End extension painful. Flexion less painful than has been      Patient is  alert, oriented, no adenopathy, well-dressed, normal affect, normal respiratory effort.   Imaging: No results found. No images are attached to the encounter.  Labs: Lab Results  Component Value Date   HGBA1C 5.4 09/23/2015   LABORGA ESCHERICHIA COLI 04/16/2015    @LABSALLVALUES (HGBA1)@  Body mass index is 30.42 kg/m.  Orders:  Orders Placed This Encounter  Procedures  . XR Knee 1-2 Views Right   No orders of the defined types were placed in this encounter.    Procedures: Large Joint Inj: R knee on 04/25/2017 11:33 AM Indications: pain Details: 18 G 1.5 in needle, anteromedial approach Medications: 5 mL lidocaine 1 %; 40 mg methylPREDNISolone acetate 40 MG/ML Aspirate: 25 mL yellow, clear and blood-tinged Consent was given by the patient.      Clinical Data: No additional findings.  ROS:  All other systems negative, except as noted in the HPI. Review of Systems  Constitutional: Negative for chills and fever.  Musculoskeletal: Positive for arthralgias, joint swelling and myalgias.    Objective: Vital Signs: Ht 5\' 9"  (1.753 m)   Wt 206 lb (93.4 kg)   BMI 30.42 kg/m   Specialty Comments:  No specialty comments available.  PMFS History: Patient Active Problem List   Diagnosis Date Noted  . Acute pain of right knee 04/17/2017  . Pain of left calf  04/13/2017  . 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 Hyperglycemia 09/23/2015  . Anxiety state 09/23/2015  . Abnormal urine odor 08/25/2015  . GERD (gastroesophageal reflux disease) 08/25/2015  . Upper abdominal pain 04/16/2015  . Early satiety 04/16/2015  . Dysuria 04/16/2015  . Loss of weight 04/16/2015  . Encounter for wellness examination 06/18/2014  . Greater trochanteric bursitis of right hip 01/06/2013  . Degenerative arthritis of left knee 09/10/2012  . Left lumbar radiculitis 04/03/2012  . Eczema 04/03/2012  . 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 BURSITIS, RIGHT HIP 08/25/2009  . INSOMNIA-SLEEP  DISORDER-UNSPEC 08/25/2009  . FATIGUE 08/25/2009  . Cervicalgia 08/14/2007  . DEPRESSION, SITUATIONAL 03/15/2007  . HYPERLIPIDEMIA 12/18/2006  . Essential hypertension 12/18/2006  . ALLERGIC RHINITIS 12/18/2006  . RENAL CALCULUS, HX OF 12/18/2006   Past Medical History:  Diagnosis Date  . ALLERGIC RHINITIS 12/18/2006   Qualifier: Diagnosis of  By: [place], [place]    . Anxiety   . Arthritis   . DEPRESSION, SITUATIONAL 03/15/2007   Qualifier: Diagnosis of  By: Wynona Luna   . GERD (gastroesophageal reflux disease)   . Headache    occasional  . History of kidney stones   . HYPERLIPIDEMIA 12/18/2006   Qualifier: History of  By: Danny Lawless CMA, [place]    . Hypertension   . HYPERTENSION 12/18/2006   Qualifier: Diagnosis of  By: Danny Lawless CMA, [place]    . INSOMNIA-SLEEP DISORDER-UNSPEC 08/25/2009   Qualifier: Diagnosis of  By: Jenny Reichmann MD, Hunt Oris   . Pain in knee region after total knee replacement ([place]) 04/03/2012  . Pneumonia   . RENAL CALCULUS, HX OF 12/18/2006   Qualifier: Diagnosis of  By: Danny Lawless CMA, [place]      Family History  Problem Relation Age of Onset  . Stroke Mother   . Heart disease Father   . Lung cancer Sister   . Lung cancer Brother     Past Surgical History:  Procedure Laterality Date  . CERVICAL DISC SURGERY     x 3  . CONVERSION TO TOTAL KNEE Left 10/09/2016   Procedure: Conversion left uni compartment arthroplasty to total knee arthroplasty;  Surgeon: Paralee Cancel, MD;  Location: [facility];  Service: Orthopedics;  Laterality: Left;  90 mins  . KNEE ARTHROSCOPY     partial  . total left knee arthoplasty     10/09/16 Dr. Alvan Dame   Social History   Occupational History  . Not on file  Tobacco Use  . Smoking status: Former Research scientist (life sciences)  . Smokeless tobacco: Never Used  . Tobacco comment: 1970's  Substance and Sexual Activity  . Alcohol use: No    Alcohol/week: 0.0 oz  . Drug use: No  . Sexual activity: Yes

## 2017-04-30 ENCOUNTER — Other Ambulatory Visit: Payer: Self-pay | Admitting: Internal Medicine

## 2017-04-30 NOTE — Telephone Encounter (Signed)
Done erx 

## 2017-05-02 ENCOUNTER — Other Ambulatory Visit: Payer: Self-pay | Admitting: Internal Medicine

## 2017-05-02 ENCOUNTER — Ambulatory Visit: Payer: Medicare HMO | Admitting: Gastroenterology

## 2017-05-02 NOTE — Telephone Encounter (Signed)
Too soon for refill, as last rx was Apr 30, 2017

## 2017-05-10 ENCOUNTER — Other Ambulatory Visit: Payer: Self-pay | Admitting: Internal Medicine

## 2017-05-11 ENCOUNTER — Other Ambulatory Visit: Payer: Self-pay | Admitting: Internal Medicine

## 2017-05-26 ENCOUNTER — Other Ambulatory Visit: Payer: Self-pay | Admitting: Gastroenterology

## 2017-05-29 ENCOUNTER — Telehealth: Payer: Self-pay | Admitting: Gastroenterology

## 2017-05-29 NOTE — Telephone Encounter (Signed)
The pt has been advised she must have office visit for any further refills.  She has had several appts and cancelled after she gets the med refill.

## 2017-05-30 ENCOUNTER — Other Ambulatory Visit: Payer: Self-pay | Admitting: Internal Medicine

## 2017-05-30 MED ORDER — ALPRAZOLAM 0.25 MG PO TABS: ORAL_TABLET | ORAL | 5 refills | Status: DC

## 2017-05-30 NOTE — Telephone Encounter (Signed)
Done erx 

## 2017-05-30 NOTE — Telephone Encounter (Signed)
Copied from [place] [id number]. Topic: Quick Communication - Rx Refill/Question >> May 30, 2017  1:22 PM Synthia Innocent wrote: Medication:  ALPRAZolam Duanne Moron) 0.25 MG tablet and naproxen (NAPROSYN) 500 MG tablet    Has the patient contacted their pharmacy? Yes.     (Agent: If no, request that the patient contact the pharmacy for the refill.)   Preferred Pharmacy (with phone number or street name): [facility] [address]: Please be advised that RX refills may take up to 3 business days. We ask that you follow-up with your pharmacy.

## 2017-05-30 NOTE — Telephone Encounter (Signed)
Rx refill request: Xanax 0.25 mg  LOV: 04/13/17   PCP: [place]: [facility]/[address]

## 2017-07-09 ENCOUNTER — Ambulatory Visit: Payer: Medicare HMO | Admitting: Gastroenterology

## 2017-07-19 ENCOUNTER — Ambulatory Visit (INDEPENDENT_AMBULATORY_CARE_PROVIDER_SITE_OTHER): Payer: Medicare HMO | Admitting: Orthopedic Surgery

## 2017-07-23 ENCOUNTER — Encounter: Payer: Medicare HMO | Admitting: Internal Medicine

## 2017-07-30 ENCOUNTER — Ambulatory Visit (INDEPENDENT_AMBULATORY_CARE_PROVIDER_SITE_OTHER): Payer: Medicare HMO | Admitting: Orthopedic Surgery

## 2017-07-30 ENCOUNTER — Encounter (INDEPENDENT_AMBULATORY_CARE_PROVIDER_SITE_OTHER): Payer: Self-pay | Admitting: Orthopedic Surgery

## 2017-07-30 VITALS — Ht 69.0 in | Wt 206.0 lb

## 2017-07-30 DIAGNOSIS — M1711 Unilateral primary osteoarthritis, right knee: Secondary | ICD-10-CM | POA: Diagnosis not present

## 2017-07-30 MED ORDER — METHYLPREDNISOLONE ACETATE 40 MG/ML IJ SUSP
40.0000 mg | INTRAMUSCULAR | Status: AC | PRN
  Administered 2017-07-30: 40 mg via INTRA_ARTICULAR

## 2017-07-30 MED ORDER — LIDOCAINE HCL 1 % IJ SOLN
5.0000 mL | INTRAMUSCULAR | Status: AC | PRN
  Administered 2017-07-30: 5 mL

## 2017-07-30 NOTE — Progress Notes (Signed)
Office Visit Note   Patient: Pamela [name]           Date of Birth: 01-22-1956           MRN: [id number] Visit Date: 07/30/2017              Requested by: Biagio Borg, MD [place], [place] [address] PCP: Biagio Borg, MD  Chief Complaint  Patient presents with  . Right Knee - Follow-up    S/p aspiration and injection 04/25/17       HPI: Patient is a 62 year old woman who presents in follow-up for osteoarthritis right knee she states that the injection lasted about 5 months.  She has pain over the anterior medial joint line pain with weightbearing pain with activities of daily living.  Assessment & Plan: Visit Diagnoses:  1. Primary osteoarthritis of right knee     Plan: Right knee was injected without complications she will use ice tonight increase her activities as tolerated.  Follow-Up Instructions: Return if symptoms worsen or fail to improve.   Ortho Exam  Patient is alert, oriented, no adenopathy, well-dressed, normal affect, normal respiratory effort. Examination patient has an antalgic gait she has a mild effusion of the right knee Clauser cruciates are stable there is no redness no cellulitis she is tender to palpation over the medial and lateral joint lines.  Imaging: No results found. No images are attached to the encounter.  Labs: Lab Results  Component Value Date   HGBA1C 5.4 09/23/2015   LABORGA ESCHERICHIA COLI 04/16/2015     Lab Results  Component Value Date   ALBUMIN 4.0 08/25/2015   ALBUMIN 4.0 06/22/2014   ALBUMIN 4.3 09/05/2013    Body mass index is 30.42 kg/m.  Orders:  No orders of the defined types were placed in this encounter.  No orders of the defined types were placed in this encounter.    Procedures: Large Joint Inj: R knee on 07/30/2017 8:28 AM Indications: pain and diagnostic evaluation Details: 22 G 1.5 in needle, anteromedial approach  Arthrogram: No  Medications: 5 mL lidocaine 1 %; 40 mg  methylPREDNISolone acetate 40 MG/ML Outcome: tolerated well, no immediate complications Procedure, treatment alternatives, risks and benefits explained, specific risks discussed. Consent was given by the patient. Immediately prior to procedure a time out was called to verify the correct patient, procedure, equipment, support staff and site/side marked as required. Patient was prepped and draped in the usual sterile fashion.      Clinical Data: No additional findings.  ROS:  All other systems negative, except as noted in the HPI. Review of Systems  Objective: Vital Signs: Ht 5\' 9"  (1.753 m)   Wt 206 lb (93.4 kg)   BMI 30.42 kg/m   Specialty Comments:  No specialty comments available.  PMFS History: Patient Active Problem List   Diagnosis Date Noted  . Acute pain of right knee 04/17/2017  . Pain of left calf 04/13/2017  . 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 Hyperglycemia 09/23/2015  . Anxiety state 09/23/2015  . Abnormal urine odor 08/25/2015  . GERD (gastroesophageal reflux disease) 08/25/2015  . Upper abdominal pain 04/16/2015  . Early satiety 04/16/2015  . Dysuria 04/16/2015  . Loss of weight 04/16/2015  . Encounter for wellness examination 06/18/2014  . Greater trochanteric bursitis of right hip 01/06/2013  . Degenerative arthritis of left knee 09/10/2012  . Left lumbar radiculitis 04/03/2012  . Eczema 04/03/2012  . TMJ disease  01/03/2012  . BURSITIS, RIGHT HIP 08/25/2009  . INSOMNIA-SLEEP DISORDER-UNSPEC 08/25/2009  . FATIGUE 08/25/2009  . Cervicalgia 08/14/2007  . DEPRESSION, SITUATIONAL 03/15/2007  . HYPERLIPIDEMIA 12/18/2006  . Essential hypertension 12/18/2006  . ALLERGIC RHINITIS 12/18/2006  . RENAL CALCULUS, HX OF 12/18/2006   Past Medical History:  Diagnosis Date  . ALLERGIC RHINITIS 12/18/2006   Qualifier: Diagnosis of  By: [place], [place]    . Anxiety   . Arthritis   . DEPRESSION, SITUATIONAL 03/15/2007   Qualifier: Diagnosis of  By: Wynona Luna   . GERD (gastroesophageal reflux disease)   . Headache    occasional  . History of kidney stones   . HYPERLIPIDEMIA 12/18/2006   Qualifier: History of  By: Danny Lawless CMA, [place]    . Hypertension   . HYPERTENSION 12/18/2006   Qualifier: Diagnosis of  By: Danny Lawless CMA, [place]    . INSOMNIA-SLEEP DISORDER-UNSPEC 08/25/2009   Qualifier: Diagnosis of  By: Jenny Reichmann MD, Hunt Oris   . Pain in knee region after total knee replacement ([place]) 04/03/2012  . Pneumonia   . RENAL CALCULUS, HX OF 12/18/2006   Qualifier: Diagnosis of  By: Danny Lawless CMA, [place]      Family History  Problem Relation Age of Onset  . Stroke Mother   . Heart disease Father   . Lung cancer Sister   . Lung cancer Brother     Past Surgical History:  Procedure Laterality Date  . CERVICAL DISC SURGERY     x 3  . CONVERSION TO TOTAL KNEE Left 10/09/2016   Procedure: Conversion left uni compartment arthroplasty to total knee arthroplasty;  Surgeon: Paralee Cancel, MD;  Location: [facility];  Service: Orthopedics;  Laterality: Left;  90 mins  . KNEE ARTHROSCOPY     partial  . total left knee arthoplasty     10/09/16 Dr. Alvan Dame   Social History   Occupational History  . Not on file  Tobacco Use  . Smoking status: Former Research scientist (life sciences)  . Smokeless tobacco: Never Used  . Tobacco comment: 1970's  Substance and Sexual Activity  . Alcohol use: No    Alcohol/week: 0.0 oz  . Drug use: No  . Sexual activity: Yes

## 2017-07-31 ENCOUNTER — Other Ambulatory Visit: Payer: Self-pay | Admitting: Internal Medicine

## 2017-07-31 NOTE — Telephone Encounter (Signed)
Copied from [place] [id number]. Topic: Quick Communication - Rx Refill/Question >> Jul 31, 2017 11:05 AM Antonieta Iba C wrote: Medication: traMADol (ULTRAM) 50 MG tablet   Has the patient contacted their pharmacy? No  (Agent: If no, request that the patient contact the pharmacy for the refill.) (Agent: If yes, when and what did the pharmacy advise?)  Preferred Pharmacy (with phone number or street name): [id number] - [place], [place] - [address] AT [address] [phone] (Phone) [phone] (Fax)      Agent: Please be advised that RX refills may take up to 3 business days. We ask that you follow-up with your pharmacy.

## 2017-07-31 NOTE — Telephone Encounter (Signed)
Refill request for Tramadol (Ultram) 50mg  tab, last filed on 02/26/17 #120 with 2 refills.   LOV: 04/13/17 Dr. Jenny Reichmann  [facility] [address] [place]

## 2017-08-01 MED ORDER — TRAMADOL HCL 50 MG PO TABS: ORAL_TABLET | ORAL | 2 refills | Status: DC

## 2017-08-01 NOTE — Telephone Encounter (Signed)
Done erx 

## 2017-08-07 ENCOUNTER — Other Ambulatory Visit: Payer: Self-pay | Admitting: Internal Medicine

## 2017-08-16 ENCOUNTER — Other Ambulatory Visit: Payer: Self-pay | Admitting: Gastroenterology

## 2017-08-16 MED ORDER — METOCLOPRAMIDE HCL 10 MG PO TABS: 10.0000 mg | ORAL_TABLET | Freq: Every day | ORAL | 0 refills | Status: DC

## 2017-08-16 NOTE — Telephone Encounter (Signed)
Sent reglan to patients pharmacy at [facility]. Only a 30 day supply with No refills patient is to keep appt with jacobs scheduled on 08/29/2017

## 2017-08-22 ENCOUNTER — Encounter: Payer: Self-pay | Admitting: Internal Medicine

## 2017-08-22 ENCOUNTER — Ambulatory Visit (INDEPENDENT_AMBULATORY_CARE_PROVIDER_SITE_OTHER): Payer: Medicare HMO | Admitting: Internal Medicine

## 2017-08-22 ENCOUNTER — Other Ambulatory Visit (INDEPENDENT_AMBULATORY_CARE_PROVIDER_SITE_OTHER): Payer: Medicare HMO

## 2017-08-22 VITALS — BP 126/84 | HR 82 | Temp 98.3°F | Ht 69.0 in | Wt 208.0 lb

## 2017-08-22 DIAGNOSIS — G8929 Other chronic pain: Secondary | ICD-10-CM | POA: Diagnosis not present

## 2017-08-22 DIAGNOSIS — M25512 Pain in left shoulder: Secondary | ICD-10-CM

## 2017-08-22 DIAGNOSIS — R739 Hyperglycemia, unspecified: Secondary | ICD-10-CM

## 2017-08-22 DIAGNOSIS — M25561 Pain in right knee: Secondary | ICD-10-CM | POA: Diagnosis not present

## 2017-08-22 DIAGNOSIS — F329 Major depressive disorder, single episode, unspecified: Secondary | ICD-10-CM | POA: Diagnosis not present

## 2017-08-22 DIAGNOSIS — Z114 Encounter for screening for human immunodeficiency virus [HIV]: Secondary | ICD-10-CM | POA: Diagnosis not present

## 2017-08-22 DIAGNOSIS — I10 Essential (primary) hypertension: Secondary | ICD-10-CM | POA: Diagnosis not present

## 2017-08-22 DIAGNOSIS — F32A Depression, unspecified: Secondary | ICD-10-CM

## 2017-08-22 DIAGNOSIS — Z0001 Encounter for general adult medical examination with abnormal findings: Secondary | ICD-10-CM | POA: Diagnosis not present

## 2017-08-22 LAB — LIPID PANEL
CHOLESTEROL: 160 mg/dL (ref 0–200)
HDL: 72.8 mg/dL (ref >39.00)
LDL Cholesterol: 80 mg/dL (ref 0–99)
NonHDL: 87.66
TRIGLYCERIDES: 37 mg/dL (ref 0.0–149.0)
Total CHOL/HDL Ratio: 2
VLDL: 7.4 mg/dL (ref 0.0–40.0)

## 2017-08-22 LAB — CBC WITH DIFFERENTIAL/PLATELET
BASOS PCT: 1.1 % (ref 0.0–3.0)
Basophils Absolute: 0.1 10*3/uL (ref 0.0–0.1)
Eosinophils Absolute: 0.2 10*3/uL (ref 0.0–0.7)
Eosinophils Relative: 2.7 % (ref 0.0–5.0)
HCT: 33 % — ABNORMAL LOW (ref 36.0–46.0)
Hemoglobin: 10.9 g/dL — ABNORMAL LOW (ref 12.0–15.0)
LYMPHS ABS: 1 10*3/uL (ref 0.7–4.0)
Lymphocytes Relative: 13 % (ref 12.0–46.0)
MCHC: 33.1 g/dL (ref 30.0–36.0)
MCV: 80.8 fl (ref 78.0–100.0)
MONO ABS: 0.6 10*3/uL (ref 0.1–1.0)
Monocytes Relative: 8.7 % (ref 3.0–12.0)
NEUTROS PCT: 74.5 % (ref 43.0–77.0)
Neutro Abs: 5.5 10*3/uL (ref 1.4–7.7)
Platelets: 251 10*3/uL (ref 150.0–400.0)
RBC: 4.08 Mil/uL (ref 3.87–5.11)
RDW: 13.3 % (ref 11.5–15.5)
WBC: 7.4 10*3/uL (ref 4.0–10.5)

## 2017-08-22 LAB — URINALYSIS, ROUTINE W REFLEX MICROSCOPIC
BILIRUBIN URINE: NEGATIVE
Hgb urine dipstick: NEGATIVE
KETONES UR: NEGATIVE
NITRITE: NEGATIVE
RBC / HPF: NONE SEEN (ref 0–?)
Specific Gravity, Urine: 1.02 (ref 1.000–1.030)
Total Protein, Urine: NEGATIVE
URINE GLUCOSE: NEGATIVE
Urobilinogen, UA: 0.2 (ref 0.0–1.0)
pH: 6 (ref 5.0–8.0)

## 2017-08-22 LAB — TSH: TSH: 0.87 u[IU]/mL (ref 0.35–4.50)

## 2017-08-22 LAB — BASIC METABOLIC PANEL
BUN: 16 mg/dL (ref 6–23)
CO2: 30 meq/L (ref 19–32)
Calcium: 9.6 mg/dL (ref 8.4–10.5)
Chloride: 104 mEq/L (ref 96–112)
Creatinine, Ser: 0.73 mg/dL (ref 0.40–1.20)
GFR: 103.54 mL/min (ref >60.00)
Glucose, Bld: 102 mg/dL — ABNORMAL HIGH (ref 70–99)
Potassium: 3.5 mEq/L (ref 3.5–5.1)
SODIUM: 140 meq/L (ref 135–145)

## 2017-08-22 LAB — HEPATIC FUNCTION PANEL
ALT: 11 U/L (ref 0–35)
AST: 13 U/L (ref 0–37)
Albumin: 3.8 g/dL (ref 3.5–5.2)
Alkaline Phosphatase: 68 U/L (ref 39–117)
BILIRUBIN TOTAL: 0.4 mg/dL (ref 0.2–1.2)
Bilirubin, Direct: 0.1 mg/dL (ref 0.0–0.3)
Total Protein: 7.3 g/dL (ref 6.0–8.3)

## 2017-08-22 LAB — HEMOGLOBIN A1C: Hgb A1c MFr Bld: 5.5 % (ref 4.6–6.5)

## 2017-08-22 MED ORDER — DULOXETINE HCL 60 MG PO CPEP: 60.0000 mg | ORAL_CAPSULE | Freq: Every day | ORAL | 3 refills | Status: DC

## 2017-08-22 NOTE — Assessment & Plan Note (Signed)
stable overall by history and exam, recent data reviewed with pt, and pt to continue medical treatment as before,  to f/u any worsening symptoms or concerns BP Readings from Last 3 Encounters:  08/22/17 126/84  04/17/17 132/74  04/13/17 130/88

## 2017-08-22 NOTE — Assessment & Plan Note (Signed)

## 2017-08-22 NOTE — Assessment & Plan Note (Addendum)
C/w impingement syndrome, for sports med referral  In addition to the time spent performing CPE, I spent an additional 25 minutes face to face,in which greater than 50% of this time was spent in counseling and coordination of care for patient's acute illness as documented, including the differential dx, treatment, further evaluation and other management of left shoulder pain, chronic right neck pain, depression, hyperglycemia, HTN

## 2017-08-22 NOTE — Assessment & Plan Note (Signed)
With mild worsening, responded well last episode with cymbalta and not taking in many months, ok for cymbalta 60 qd

## 2017-08-22 NOTE — Progress Notes (Signed)
Subjective:    Patient ID: Pamela [name], female    DOB: 1955/05/25, 62 y.o.   MRN: [id number]  HPI  Here for wellness and f/u;  Overall doing ok;  Pt denies Chest pain, worsening SOB, DOE, wheezing, orthopnea, PND, worsening LE edema, palpitations, dizziness or syncope.  Pt denies neurological change such as new headache, facial or extremity weakness.  Pt states overall good compliance with treatment and medications, good tolerability, and has been trying to follow appropriate diet.  Pt denies worsening depressive symptoms, suicidal ideation or panic. No fever, night sweats, wt loss, loss of appetite, or other constitutional symptoms.  Pt states good ability with ADL's, has low fall risk, home safety reviewed and adequate, no other significant changes in hearing or vision, and only occasionally active with exercise.  Pt denies polydipsia, polyuria, .  Pt states overall good compliance with meds, trying to follow lower cholesterol, diabetic diet, wt overall stable. Has had mild worsening depressive symptoms, but no suicidal ideation, or panic; has ongoing anxiety, not increased recently.   Also with recent worsening left shoulder pain, sharp, mild, constant, but worse to abduct for 2 months.  No neck or other arm pain, but has chronic right knee pain as well for last year with intermittent swelling, no giveaways or falls Past Medical History:  Diagnosis Date  . ALLERGIC RHINITIS 12/18/2006   Qualifier: Diagnosis of  By: [place], [place]    . Anxiety   . Arthritis   . DEPRESSION, SITUATIONAL 03/15/2007   Qualifier: Diagnosis of  By: Wynona Luna   . GERD (gastroesophageal reflux disease)   . Headache    occasional  . History of kidney stones   . HYPERLIPIDEMIA 12/18/2006   Qualifier: History of  By: Danny Lawless CMA, [place]    . Hypertension   . HYPERTENSION 12/18/2006   Qualifier: Diagnosis of  By: Danny Lawless CMA, [place]    . INSOMNIA-SLEEP DISORDER-UNSPEC 08/25/2009   Qualifier: Diagnosis of   By: Jenny Reichmann MD, Hunt Oris   . Pain in knee region after total knee replacement ([place]) 04/03/2012  . Pneumonia   . RENAL CALCULUS, HX OF 12/18/2006   Qualifier: Diagnosis of  By: [place], [place]     Past Surgical History:  Procedure Laterality Date  . CERVICAL DISC SURGERY     x 3  . CONVERSION TO TOTAL KNEE Left 10/09/2016   Procedure: Conversion left uni compartment arthroplasty to total knee arthroplasty;  Surgeon: Paralee Cancel, MD;  Location: [facility];  Service: Orthopedics;  Laterality: Left;  90 mins  . KNEE ARTHROSCOPY     partial  . total left knee arthoplasty     10/09/16 Dr. Alvan Dame    reports that she has quit smoking. She has never used smokeless tobacco. She reports that she does not drink alcohol or use drugs. family history includes Heart disease in her father; Lung cancer in her brother and sister; Stroke in her mother. No Known Allergies Current Outpatient Medications on File Prior to Visit  Medication Sig Dispense Refill  . ALPRAZolam (XANAX) 0.25 MG tablet take 1 tablet by mouth twice a day if needed 60 tablet 5  . aspirin 81 MG chewable tablet Chew 1 tablet (81 mg total) by mouth 2 (two) times daily. Take for 4 weeks. 60 tablet 0  . atorvastatin (LIPITOR) 20 MG tablet TAKE 1 TABLET (20 MG TOTAL) BY MOUTH DAILY. 90 tablet 0  . Camphor-Menthol-Methyl Sal (TIGER BALM MUSCLE RUB EX) Apply 1 application  topically daily as needed (pain).    . cholecalciferol (VITAMIN D) 1000 units tablet Take 1,000 Units by mouth daily.    . cyclobenzaprine (FLEXERIL) 5 MG tablet take 1 tablet by mouth three times a day if needed for muscle spasms 30 tablet 0  . gabapentin (NEURONTIN) 300 MG capsule   0  . losartan-hydrochlorothiazide (HYZAAR) 50-12.5 MG tablet TAKE 1 TABLET BY MOUTH EVERY DAY 90 tablet 0  . metoCLOPramide (REGLAN) 10 MG tablet Take 1 tablet (10 mg total) by mouth at bedtime. 30 tablet 0  . naproxen (NAPROSYN) 500 MG tablet Take 1 tablet (500 mg total) by mouth 2 (two) times daily  with a meal. 60 tablet 0  . pantoprazole (PROTONIX) 40 MG tablet take 1 tablet by mouth once daily 90 tablet 1  . polyethylene glycol (MIRALAX / GLYCOLAX) packet Take 17 g by mouth 2 (two) times daily. 14 each 0  . traMADol (ULTRAM) 50 MG tablet take 1 tablet by mouth every 6 hours if needed for pain 120 tablet 2  . zolpidem (AMBIEN) 10 MG tablet take 1 tablet by mouth at bedtime for sleep 90 tablet 1   No current facility-administered medications on file prior to visit.    Review of Systems Constitutional: Negative for other unusual diaphoresis, sweats, appetite or weight changes HENT: Negative for other worsening hearing loss, ear pain, facial swelling, mouth sores or neck stiffness.   Eyes: Negative for other worsening pain, redness or other visual disturbance.  Respiratory: Negative for other stridor or swelling Cardiovascular: Negative for other palpitations or other chest pain  Gastrointestinal: Negative for worsening diarrhea or loose stools, blood in stool, distention or other pain Genitourinary: Negative for hematuria, flank pain or other change in urine volume.  Musculoskeletal: Negative for myalgias or other joint swelling.  Skin: Negative for other color change, or other wound or worsening drainage.  Neurological: Negative for other syncope or numbness. Hematological: Negative for other adenopathy or swelling Psychiatric/Behavioral: Negative for hallucinations, other worsening agitation, SI, self-injury, or new decreased concentration All other system neg per pt    Objective:   Physical Exam BP 126/84   Pulse 82   Temp 98.3 F (36.8 C) (Oral)   Ht 5\' 9"  (1.753 m)   Wt 208 lb (94.3 kg)   SpO2 96%   BMI 30.72 kg/m  VS noted, not ill appearing Constitutional: Pt is oriented to person, place, and time. Appears well-developed and well-nourished, in no significant distress and comfortable Head: Normocephalic and atraumatic  Eyes: Conjunctivae and EOM are normal. Pupils are  equal, round, and reactive to light Right Ear: External ear normal without discharge Left Ear: External ear normal without discharge Nose: Nose without discharge or deformity Mouth/Throat: Oropharynx is without other ulcerations and moist  Neck: Normal range of motion. Neck supple. No JVD present. No tracheal deviation present or significant neck LA or mass Cardiovascular: Normal rate, regular rhythm, normal heart sounds and intact distal pulses.   Pulmonary/Chest: WOB normal and breath sounds without rales or wheezing  Abdominal: Soft. Bowel sounds are normal. NT. No HSM  Musculoskeletal: Normal range of motion. Exhibits no edema except for left shoulder FROM but tender left bursal area Lymphadenopathy: Has no other cervical adenopathy.  Neurological: Pt is alert and oriented to person, place, and time. Pt has normal reflexes. No cranial nerve deficit. Motor grossly intact, Gait intact Skin: Skin is warm and dry. No rash noted or new ulcerations Psychiatric:  Has depressed mood and affect. Behavior is  normal without agitation No other exam findings Lab Results  Component Value Date   WBC 9.6 10/11/2016   HGB 8.8 (L) 10/11/2016   HCT 25.8 (L) 10/11/2016   PLT 235 10/11/2016   GLUCOSE 115 (H) 10/11/2016   CHOL 187 09/23/2015   TRIG 79.0 09/23/2015   HDL 74.00 09/23/2015   LDLDIRECT 109.9 04/03/2012   LDLCALC 97 09/23/2015   ALT 15 08/25/2015   AST 15 08/25/2015   NA 141 10/11/2016   K 3.5 10/11/2016   CL 105 10/11/2016   CREATININE 0.59 10/11/2016   BUN 10 10/11/2016   CO2 28 10/11/2016   TSH 1.11 08/25/2015   INR 1.0 06/29/2008   HGBA1C 5.4 09/23/2015       Assessment & Plan:

## 2017-08-22 NOTE — Patient Instructions (Addendum)
OK to restart the Cymbalta 60 mg per day  Please continue all other medications as before, and refills have been done if requested.  Please have the pharmacy call with any other refills you may need.  Please continue your efforts at being more active, low cholesterol diet, and weight control.  You are otherwise up to date with prevention measures today.  Please keep your appointments with your specialists as you may have planned  You will be contacted regarding the referral for: GYN and Sports Medicine  Please go to the LAB in the Basement (turn left off the elevator) for the tests to be done today  You will be contacted by phone if any changes need to be made immediately.  Otherwise, you will receive a letter about your results with an explanation, but please check with MyChart first.  Please remember to sign up for MyChart if you have not done so, as this will be important to you in the future with finding out test results, communicating by private email, and scheduling acute appointments online when needed.  Please return in 6 months, or sooner if needed

## 2017-08-22 NOTE — Assessment & Plan Note (Signed)
C/w likely mild DJD, for sport med referral for possible cortisone

## 2017-08-22 NOTE — Assessment & Plan Note (Signed)
stable overall by history and exam, recent data reviewed with pt, and pt to continue medical treatment as before,  to f/u any worsening symptoms or concerns Lab Results  Component Value Date   HGBA1C 5.4 09/23/2015

## 2017-08-23 LAB — HIV ANTIBODY (ROUTINE TESTING W REFLEX): HIV: NONREACTIVE

## 2017-08-29 ENCOUNTER — Ambulatory Visit: Payer: Medicare HMO | Admitting: Gastroenterology

## 2017-09-03 ENCOUNTER — Other Ambulatory Visit: Payer: Self-pay | Admitting: Internal Medicine

## 2017-09-21 ENCOUNTER — Ambulatory Visit: Payer: Medicare HMO | Admitting: Family Medicine

## 2017-09-21 ENCOUNTER — Encounter: Payer: Self-pay | Admitting: Internal Medicine

## 2017-09-26 ENCOUNTER — Encounter: Payer: Medicare HMO | Admitting: Internal Medicine

## 2017-10-05 ENCOUNTER — Other Ambulatory Visit: Payer: Self-pay | Admitting: Gastroenterology

## 2017-10-05 MED ORDER — METOCLOPRAMIDE HCL 10 MG PO TABS: 10.0000 mg | ORAL_TABLET | Freq: Every day | ORAL | 0 refills | Status: DC

## 2017-10-05 NOTE — Telephone Encounter (Signed)
Pt needs rf for reglan sent to [facility] on [address]. She has ov with Dr. Ardis Hughs on 10/30/17.

## 2017-10-05 NOTE — Telephone Encounter (Signed)
Prescription sent to patient's pharmacy and patient notified to keep appt in August for further refills

## 2017-10-12 ENCOUNTER — Other Ambulatory Visit: Payer: Self-pay | Admitting: Internal Medicine

## 2017-10-12 NOTE — Telephone Encounter (Signed)
Routing to dr john, please advise, thanks 

## 2017-10-12 NOTE — Telephone Encounter (Signed)
Done erx 

## 2017-10-14 ENCOUNTER — Emergency Department (HOSPITAL_COMMUNITY): Payer: Medicare HMO

## 2017-10-14 ENCOUNTER — Encounter (HOSPITAL_COMMUNITY): Payer: Self-pay | Admitting: Emergency Medicine

## 2017-10-14 ENCOUNTER — Emergency Department (HOSPITAL_COMMUNITY)
Admission: EM | Admit: 2017-10-14 | Discharge: 2017-10-14 | Disposition: A | Discharge status: Home / Self Care | Payer: Medicare HMO | Attending: Emergency Medicine | Admitting: Emergency Medicine

## 2017-10-14 DIAGNOSIS — Z7982 Long term (current) use of aspirin: Secondary | ICD-10-CM | POA: Insufficient documentation

## 2017-10-14 DIAGNOSIS — M25422 Effusion, left elbow: Secondary | ICD-10-CM | POA: Diagnosis not present

## 2017-10-14 DIAGNOSIS — W19XXXA Unspecified fall, initial encounter: Secondary | ICD-10-CM

## 2017-10-14 DIAGNOSIS — I10 Essential (primary) hypertension: Secondary | ICD-10-CM | POA: Insufficient documentation

## 2017-10-14 DIAGNOSIS — Z87891 Personal history of nicotine dependence: Secondary | ICD-10-CM | POA: Insufficient documentation

## 2017-10-14 DIAGNOSIS — M7989 Other specified soft tissue disorders: Secondary | ICD-10-CM | POA: Diagnosis not present

## 2017-10-14 DIAGNOSIS — M25562 Pain in left knee: Secondary | ICD-10-CM | POA: Diagnosis not present

## 2017-10-14 MED ORDER — NAPROXEN 500 MG PO TABS: 500.0000 mg | ORAL_TABLET | Freq: Two times a day (BID) | ORAL | 0 refills | Status: DC

## 2017-10-14 MED ORDER — HYDROCODONE-ACETAMINOPHEN 5-325 MG PO TABS
1.0000 | ORAL_TABLET | Freq: Once | ORAL | Status: AC
  Administered 2017-10-14: 1 via ORAL
  Filled 2017-10-14: qty 1

## 2017-10-14 NOTE — ED Triage Notes (Signed)
Pt presents to ED for assessment of left knee pain and elbow pain after a trip and fall at the grocery store today.

## 2017-10-14 NOTE — ED Provider Notes (Signed)
[facility] EMERGENCY DEPARTMENT Provider Note   CSN: [id number] Arrival date & time: 10/14/17  1322     History   Chief Complaint Chief Complaint  Patient presents with  . Fall  . Knee Pain    HPI Pamela [name] is a 62 y.o. female with a past medical history significant for left total knee replacement in 2018 who presents to the ED 10/14/2016 accompanied by her husband with a chief complaint of left knee pain and left elbow pain post status mechanical fall while she was at [employer] 10/14/2017 at approximately 12 PM.  Patient states that she went to pick up a watermelon and misjudged the the distance to the cart and tripped over her foot and landed on her left knee and left elbow. Patient denies hitting her head or any loss of consciousness. Pain is currently a 9 out of 10 to the left knee and a 4 out of 10 to the left elbow. Describes the pain as a constant throb.  Does admit to swelling to the left knee.  Denies any alleviating symptoms.  Patient admits to difficulty walking secondary to pain in her left knee, however she was able to walk out of the grocery store and walk into the emergency room without any issues per patient. Denies back pain, neck pain or stiffness, limited ROM, numbness, tingling or stiffness to the lower extremities, or any known dislocation of her knee or elbow.    Past Medical History:  Diagnosis Date  . ALLERGIC RHINITIS 12/18/2006   Qualifier: Diagnosis of  By: [place], [place]    . Anxiety   . Arthritis   . DEPRESSION, SITUATIONAL 03/15/2007   Qualifier: Diagnosis of  By: Wynona Luna   . GERD (gastroesophageal reflux disease)   . Headache    occasional  . History of kidney stones   . HYPERLIPIDEMIA 12/18/2006   Qualifier: History of  By: Danny Lawless CMA, [place]    . Hypertension   . HYPERTENSION 12/18/2006   Qualifier: Diagnosis of  By: Danny Lawless CMA, [place]    . INSOMNIA-SLEEP DISORDER-UNSPEC 08/25/2009   Qualifier: Diagnosis of  By:  Jenny Reichmann MD, Hunt Oris   . Pain in knee region after total knee replacement ([place]) 04/03/2012  . Pneumonia   . RENAL CALCULUS, HX OF 12/18/2006   Qualifier: Diagnosis of  By: Danny Lawless CMA, [place]      Patient Active Problem List   Diagnosis Date Noted  . Left shoulder pain 08/22/2017  . Chronic pain of right knee 08/22/2017  . Acute pain of right knee 04/17/2017  . Pain of left calf 04/13/2017  . 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 Hyperglycemia 09/23/2015  . Anxiety state 09/23/2015  . Abnormal urine odor 08/25/2015  . GERD (gastroesophageal reflux disease) 08/25/2015  . Upper abdominal pain 04/16/2015  . Early satiety 04/16/2015  . Dysuria 04/16/2015  . Loss of weight 04/16/2015  . Encounter for well adult exam with abnormal findings 06/18/2014  . Greater trochanteric bursitis of right hip 01/06/2013  . Degenerative arthritis of left knee 09/10/2012  . Left lumbar radiculitis 04/03/2012  . Eczema 04/03/2012  . 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 BURSITIS, RIGHT HIP 08/25/2009  . INSOMNIA-SLEEP DISORDER-UNSPEC 08/25/2009  . FATIGUE 08/25/2009  . Cervicalgia 08/14/2007  . Depression 03/15/2007  . HYPERLIPIDEMIA 12/18/2006  . Essential hypertension 12/18/2006  . ALLERGIC RHINITIS 12/18/2006  . RENAL CALCULUS, HX OF 12/18/2006    Past Surgical History:  Procedure Laterality Date  .  CERVICAL DISC SURGERY     x 3  . CONVERSION TO TOTAL KNEE Left 10/09/2016   Procedure: Conversion left uni compartment arthroplasty to total knee arthroplasty;  Surgeon: Paralee Cancel, MD;  Location: [facility];  Service: Orthopedics;  Laterality: Left;  90 mins  . KNEE ARTHROSCOPY     partial  . total left knee arthoplasty     10/09/16 Dr. Alvan Dame     OB History   None      Home Medications    Prior to Admission medications   Medication Sig Start Date End Date Taking? Authorizing Provider  ALPRAZolam Duanne Moron) 0.25 MG tablet take 1 tablet by mouth twice a day if needed 05/30/17   Biagio Borg, MD  aspirin 81  MG chewable tablet Chew 1 tablet (81 mg total) by mouth 2 (two) times daily. Take for 4 weeks. 10/13/16   Danae Orleans, PA-C  atorvastatin (LIPITOR) 20 MG tablet TAKE 1 TABLET (20 MG TOTAL) BY MOUTH DAILY. 09/03/17   Biagio Borg, MD  Camphor-Menthol-Methyl Sal (TIGER BALM MUSCLE RUB EX) Apply 1 application topically daily as needed (pain).    [provider]  cholecalciferol (VITAMIN D) 1000 units tablet Take 1,000 Units by mouth daily.    [provider]  cyclobenzaprine (FLEXERIL) 5 MG tablet take 1 tablet by mouth three times a day if needed for muscle spasms 10/13/16   Danae Orleans, PA-C  DULoxetine (CYMBALTA) 60 MG capsule Take 1 capsule (60 mg total) by mouth daily. 08/22/17   Biagio Borg, MD  gabapentin (NEURONTIN) 300 MG capsule  03/08/17   [provider]  losartan-hydrochlorothiazide (HYZAAR) 50-12.5 MG tablet TAKE 1 TABLET BY MOUTH EVERY DAY 08/07/17   Biagio Borg, MD  metoCLOPramide (REGLAN) 10 MG tablet Take 1 tablet (10 mg total) by mouth at bedtime. 10/05/17   Milus Banister, MD  naproxen (NAPROSYN) 500 MG tablet Take 1 tablet (500 mg total) by mouth 2 (two) times daily. 10/14/17   Mayla Biddy A, PA-C  pantoprazole (PROTONIX) 40 MG tablet take 1 tablet by mouth once daily 12/27/16   Biagio Borg, MD  polyethylene glycol [facility] / Floria Raveling) packet Take 17 g by mouth 2 (two) times daily. 10/13/16   Danae Orleans, PA-C  traMADol Veatrice Bourbon) 50 MG tablet take 1 tablet by mouth every 6 hours if needed for pain 08/01/17   Biagio Borg, MD  zolpidem (AMBIEN) 10 MG tablet TAKE 1 TABLET BY MOUTH AT BEDTIME FOR SLEEP 10/12/17   Biagio Borg, MD    Family History Family History  Problem Relation Age of Onset  . Stroke Mother   . Heart disease Father   . Lung cancer Sister   . Lung cancer Brother     Social History Social History   Tobacco Use  . Smoking status: Former Research scientist (life sciences)  . Smokeless tobacco: Never Used  . Tobacco comment: 1970's  Substance  Use Topics  . Alcohol use: No    Alcohol/week: 0.0 oz  . Drug use: No     Allergies   Patient has no known allergies.   Review of Systems Review of Systems  HENT: Negative.   Respiratory: Negative for chest tightness and shortness of breath.   Cardiovascular: Negative for chest pain and leg swelling.  Musculoskeletal: Negative for back pain, gait problem, joint swelling, neck pain and neck stiffness.       Edema to the left lateral knee.  Skin: Negative for color change and pallor.  Neurological: Negative for dizziness, weakness, light-headedness, numbness and headaches.     Physical Exam Updated Vital Signs BP 110/72 (BP Location: Right Arm)   Pulse 81   Temp 99 F (37.2 C) (Oral)   Resp 18   SpO2 99%   Physical Exam  Constitutional: She is oriented to person, place, and time. She appears well-developed and well-nourished. No distress.  HENT:  Head: Normocephalic and atraumatic.  Eyes: Pupils are equal, round, and reactive to light.  Neck: Normal range of motion.  Cardiovascular: Normal rate.  Pulmonary/Chest: Effort normal and breath sounds normal. She exhibits no tenderness.  Abdominal: She exhibits no distension.  Musculoskeletal: Normal range of motion. She exhibits tenderness. She exhibits no deformity.  Full range of motion with flexion and extension to the left elbow and left knee.  No tenderness to palpation to the left elbow. Tenderness to palpation to the left knee, more prominent in the lateral portion. Mild soft tissue swelling to the lateral left knee.  Neurological: She is alert and oriented to person, place, and time. She has normal strength. No sensory deficit.  Skin: Skin is warm and dry. No erythema.  No ecchymosis to the left knee or left elbow.  Mild edema to the lateral portion of the left knee. No edema to the left elbow.     ED Treatments / Results  Labs (all labs ordered are listed, but only abnormal results are displayed) Labs Reviewed -  No data to display  EKG None  Radiology Dg Elbow Complete Left  Result Date: 10/14/2017 CLINICAL DATA:  Recent trip and fall with elbow pain, initial encounter EXAM: LEFT ELBOW - COMPLETE 3+ VIEW COMPARISON:  None. FINDINGS: No acute fracture or dislocation is noted. Mild elevation of the anterior fat pad is noted consistent with a joint effusion. No other soft tissue abnormality is seen. IMPRESSION: Mild joint effusion without acute bony abnormality. Electronically Signed   By: Inez Catalina M.D.   On: 10/14/2017 14:29   Dg Knee Complete 4 Views Left  Result Date: 10/14/2017 CLINICAL DATA:  Recent trip and fall with knee pain, initial encounter EXAM: LEFT KNEE - COMPLETE 4+ VIEW COMPARISON:  12/09/2015 FINDINGS: Previously seen partial knee replacement has been converted to a full knee replacement on the left in the interval. No acute fracture or loosening is identified. Some soft tissue changes are noted over the patella consistent with the recent injury. IMPRESSION: Soft tissue swelling without acute bony abnormality. Electronically Signed   By: Inez Catalina M.D.   On: 10/14/2017 14:26    Procedures Procedures (including critical care time)  Medications Ordered in ED Medications  HYDROcodone-acetaminophen (NORCO/VICODIN) 5-325 MG per tablet 1 tablet (1 tablet Oral Given 10/14/17 1436)     Initial Impression / Assessment and Plan / ED Course  I have reviewed the triage vital signs and the nursing notes.  Pertinent labs & imaging results that were available during my care of the patient were reviewed by me and considered in my medical decision making (see chart for details).  62 year old female with a past medical history significant for total knee replacement in 2018 presents with left elbow and knee post mechanical fall 10/14/17 while at the grocery store. Denies head trauma or LOC. Pain is located to the lateral aspect of the knee. Full ROM of the left elbow and left knee. Neurovascularly  intact. I have personally reviewed the plain films and agree with the radiologist findings of left elbow effusion and soft  tissue swelling to the left knee with no acute fracture visible. Patients knee pain was a 9/10 on arrival to the ED and was given Norco for analgesia. Patient is accompanied by her husband who will drive her home. On recheck patients pain well controlled.   Low suspicion for disruption of her knee hardwear considering negative plain film and low suspicion for possible dislocation with spontaneous relocation given patients history and physical exam findings. Discussed the results of the plain films with patient and husband. Will place patient in a knee sleeve and crutches and have her follow up with her Dr. Alvan Dame at [facility] who performed her total knee replacement for follow up on her knee. At this time patient is suitable for discharge. Will send patient home with naproxen, which she has taken previously without difficulties. Discussed medication side effects with patient and husband as well as return precautions. Patient and husband voiced understanding.    Final Clinical Impressions(s) / ED Diagnoses   Final diagnoses:  Fall, initial encounter  Acute pain of left knee  Effusion of left elbow    ED Discharge Orders        Ordered    naproxen (NAPROSYN) 500 MG tablet  2 times daily     10/14/17 1511       Guelda Batson A, PA-C 10/14/17 1607    Quintella Reichert, MD 10/14/17 1624

## 2017-10-14 NOTE — Discharge Instructions (Addendum)
Your xrays at today's visit did not show a fracture in your knee or your elbow. I would recommend following up your Orthopedist, Dr. Alvan Dame who performed your total knee replacement within the week. You have been given an anti-inflammatory. Please take as prescribed. Do not take any other anti-inflammatory medications at the same time.  Contact a doctor if: The pain does not stop. The pain changes or gets worse. You have a fever along with knee pain. Your knee gives out or locks up. Your knee swells, and becomes worse. Get help right away if: Your knee feels warm. You cannot move your knee. You have very bad knee pain. You have chest pain. You have trouble breathing.

## 2017-10-16 ENCOUNTER — Telehealth: Payer: Self-pay | Admitting: Internal Medicine

## 2017-10-16 NOTE — Telephone Encounter (Signed)
Copied from [place] [id number]. Topic: Appointment Scheduling - Scheduling Inquiry for Clinic >> Oct 16, 2017 10:35 AM Synthia Innocent wrote: Reason for CRM: Requesting shingles injection, please advise.

## 2017-10-23 ENCOUNTER — Ambulatory Visit: Payer: Medicare HMO

## 2017-10-30 ENCOUNTER — Ambulatory Visit: Payer: Medicare HMO | Admitting: Gastroenterology

## 2017-10-30 ENCOUNTER — Encounter

## 2017-11-02 ENCOUNTER — Ambulatory Visit (INDEPENDENT_AMBULATORY_CARE_PROVIDER_SITE_OTHER): Payer: Medicare HMO | Admitting: Gastroenterology

## 2017-11-02 ENCOUNTER — Encounter: Payer: Self-pay | Admitting: Gastroenterology

## 2017-11-02 VITALS — BP 126/80 | HR 86 | Ht 69.0 in | Wt 201.0 lb

## 2017-11-02 DIAGNOSIS — K3184 Gastroparesis: Secondary | ICD-10-CM

## 2017-11-02 DIAGNOSIS — R634 Abnormal weight loss: Secondary | ICD-10-CM | POA: Diagnosis not present

## 2017-11-02 DIAGNOSIS — R630 Anorexia: Secondary | ICD-10-CM

## 2017-11-02 MED ORDER — PANTOPRAZOLE SODIUM 40 MG PO TBEC: 40.0000 mg | DELAYED_RELEASE_TABLET | Freq: Every day | ORAL | 11 refills | Status: DC

## 2017-11-02 MED ORDER — METOCLOPRAMIDE HCL 10 MG PO TABS: 10.0000 mg | ORAL_TABLET | Freq: Two times a day (BID) | ORAL | 11 refills | Status: DC

## 2017-11-02 NOTE — Patient Instructions (Addendum)
#  2 side effect of ultram is nausea. Restart pantoprazole 40 every morning. Increase reglan (10mg  pills) to AM dosing in addition to your bedtime dosing Please return to see Dr. Ardis Hughs in 2 months.On 01/02/18 at 9am

## 2017-11-02 NOTE — Progress Notes (Signed)
Review of pertinent gastrointestinal problems: 1. Routine risk for colon cancer: Routine screening colonoscopy 02/2007 Dr. Ardis Hughs found single small HP polyp, was recommended to have repeat screening in 10 years. Colonoscopy 10/2015 was normal. 2. Dysphagia; EGD 04/2015 Dr. Ardis Hughs found thin Schatzki's ring (dilated to 35mm), also retained food in stomach, gastritis (H. Pylori neg by biopsy).   3. 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 4. Early satiety, weight loss: 2017 workup; CT scan abd/pelv 09/2015 essentially normal.    HPI: This is a very pleasant 62 year old woman whom I last saw the time of a colonoscopy a little over 2 years ago  Still early satiety, poor appetite. Has gagging in the morning, can throw up phlegm.  Going on for two years.  Thinks she's lost 20 pounds in past year, our scale does not verify.  No abd pains after eating.  Just poor appetite.  Has not been taking pantoprazole despite it's on her med list. Says she doesn't have it at home.  Takes reglan 10mg  at bedtime.  Takes tramadol daily.  Chief complaint is anorexia, mild weight loss  Labs 08/2017: cbc Hb 10.9, cmet essentially normal.  ROS: complete GI ROS as described in HPI, all other review negative.  Constitutional:  No unintentional weight loss  Weight down 5 pounds in 2 years (same scale in our office)   Past Medical History:  Diagnosis Date  . ALLERGIC RHINITIS 12/18/2006   Qualifier: Diagnosis of  By: [place], [place]    . Anxiety   . Arthritis   . DEPRESSION, SITUATIONAL 03/15/2007   Qualifier: Diagnosis of  By: Wynona Luna   . GERD (gastroesophageal reflux disease)   . Headache    occasional  . History of kidney stones   . HYPERLIPIDEMIA 12/18/2006   Qualifier: History of  By: Danny Lawless CMA, [place]    . Hypertension   . HYPERTENSION  12/18/2006   Qualifier: Diagnosis of  By: Danny Lawless CMA, [place]    . INSOMNIA-SLEEP DISORDER-UNSPEC 08/25/2009   Qualifier: Diagnosis of  By: Jenny Reichmann MD, Hunt Oris   . Pain in knee region after total knee replacement ([facility]) 04/03/2012  . Pneumonia   . RENAL CALCULUS, HX OF 12/18/2006   Qualifier: Diagnosis of  By: [place], [place]      Past Surgical History:  Procedure Laterality Date  . CERVICAL DISC SURGERY     x 3  . CONVERSION TO TOTAL KNEE Left 10/09/2016   Procedure: Conversion left uni compartment arthroplasty to total knee arthroplasty;  Surgeon: Paralee Cancel, MD;  Location: [facility];  Service: Orthopedics;  Laterality: Left;  90 mins  . KNEE ARTHROSCOPY     partial  . total left knee arthoplasty     10/09/16 Dr. Alvan Dame    Current Outpatient Medications  Medication Sig Dispense Refill  . ALPRAZolam (XANAX) 0.25 MG tablet take 1 tablet by mouth twice a day if needed 60 tablet 5  . aspirin 81 MG chewable tablet Chew 1 tablet (81 mg total) by mouth 2 (two) times daily. Take for 4 weeks. 60 tablet 0  . atorvastatin (LIPITOR) 20 MG tablet TAKE 1 TABLET (20 MG TOTAL) BY MOUTH DAILY. 90 tablet 3  . Camphor-Menthol-Methyl Sal (TIGER BALM MUSCLE RUB EX) Apply 1 application topically daily as needed (pain).    . cholecalciferol (VITAMIN D) 1000 units tablet Take 1,000 Units by mouth daily.    Marland Kitchen  cyclobenzaprine (FLEXERIL) 5 MG tablet take 1 tablet by mouth three times a day if needed for muscle spasms 30 tablet 0  . DULoxetine (CYMBALTA) 60 MG capsule Take 1 capsule (60 mg total) by mouth daily. 90 capsule 3  . gabapentin (NEURONTIN) 300 MG capsule   0  . losartan-hydrochlorothiazide (HYZAAR) 50-12.5 MG tablet TAKE 1 TABLET BY MOUTH EVERY DAY 90 tablet 0  . metoCLOPramide (REGLAN) 10 MG tablet Take 1 tablet (10 mg total) by mouth at bedtime. 30 tablet 0  . naproxen (NAPROSYN) 500 MG tablet Take 1 tablet (500 mg total) by mouth 2 (two) times daily. 30 tablet 0  . pantoprazole (PROTONIX) 40 MG tablet  take 1 tablet by mouth once daily 90 tablet 1  . traMADol (ULTRAM) 50 MG tablet take 1 tablet by mouth every 6 hours if needed for pain 120 tablet 2  . zolpidem (AMBIEN) 10 MG tablet TAKE 1 TABLET BY MOUTH AT BEDTIME FOR SLEEP 90 tablet 0   No current facility-administered medications for this visit.     Allergies as of 11/02/2017  . (No Known Allergies)    Family History  Problem Relation Age of Onset  . Stroke Mother   . Heart disease Father   . Lung cancer Sister   . Lung cancer Brother     Social History   Socioeconomic History  . Marital status: Married    Spouse name: Not on file  . Number of children: 3  . Years of education: Not on file  . Highest education level: Not on file  Occupational History  . Not on file  Social Needs  . Financial resource strain: Not on file  . Food insecurity:    Worry: Not on file    Inability: Not on file  . Transportation needs:    Medical: Not on file    Non-medical: Not on file  Tobacco Use  . Smoking status: Former Research scientist (life sciences)  . Smokeless tobacco: Never Used  . Tobacco comment: 1970's  Substance and Sexual Activity  . Alcohol use: No    Alcohol/week: 0.0 standard drinks  . Drug use: No  . Sexual activity: Yes  Lifestyle  . Physical activity:    Days per week: Not on file    Minutes per session: Not on file  . Stress: Not on file  Relationships  . Social connections:    Talks on phone: Not on file    Gets together: Not on file    Attends religious service: Not on file    Active member of club or organization: Not on file    Attends meetings of clubs or organizations: Not on file    Relationship status: Not on file  . Intimate partner violence:    Fear of current or ex partner: Not on file    Emotionally abused: Not on file    Physically abused: Not on file    Forced sexual activity: Not on file  Other Topics Concern  . Not on file  Social History Narrative  . Not on file     Physical Exam: BP 126/80   Pulse  86   Ht 5\' 9"  (1.753 m)   Wt 201 lb (91.2 kg)   BMI 29.68 kg/m  Constitutional: generally well-appearing Psychiatric: alert and oriented x3 Abdomen: soft, nontender, nondistended, no obvious ascites, no peritoneal signs, normal bowel sounds No peripheral edema noted in lower extremities  Assessment and plan: 62 y.o. female with anorexia, mild weight loss  This  is the same issue she had when I saw her about 2 years ago.  I am suspicious that this is from her proven gastroparesis.  Previous EGD showed remaining gastric contents, gastric emptying scan remotely showed slow gastric emptying.  I am going to increase her Reglan to a.m. dosing in addition to her usual bedtime dosing, 10 mg.  She is also not taking her proton pump inhibitor and we will re-prescribe this once daily shortly before breakfast.  She will return to see me in 2 months and sooner if needed.  Please see the "Patient Instructions" section for addition details about the plan.  Owens Loffler, MD [facility] 11/02/2017, 9:38 AM

## 2017-11-13 ENCOUNTER — Ambulatory Visit: Payer: Medicare HMO

## 2017-11-14 ENCOUNTER — Ambulatory Visit: Payer: Medicare HMO

## 2017-11-20 ENCOUNTER — Ambulatory Visit: Payer: Medicare HMO

## 2017-11-21 ENCOUNTER — Telehealth: Payer: Self-pay | Admitting: Internal Medicine

## 2017-11-21 MED ORDER — LOSARTAN POTASSIUM-HCTZ 50-12.5 MG PO TABS: 1.0000 | ORAL_TABLET | Freq: Every day | ORAL | 0 refills | Status: DC

## 2017-11-21 NOTE — Telephone Encounter (Signed)
Losartan-hydrochlorothiazide 50-12.5 mg  refill Last Refill:08/07/17 # 90 Last OV: 08/22/17 PCP: [place]:  [facility] [phone]

## 2017-11-21 NOTE — Telephone Encounter (Signed)
Copied from [place] [id number]. Topic: Quick Communication - See Telephone Encounter >> Nov 21, 2017  8:38 AM Marja Kays F wrote: Pt is neediing a refill on losartan   [facility]   Best number [phone]

## 2017-12-03 ENCOUNTER — Telehealth: Payer: Self-pay | Admitting: Internal Medicine

## 2017-12-03 NOTE — Telephone Encounter (Signed)
Copied from [place] [id number]. Topic: Quick Communication - Rx Refill/Question >> Dec 03, 2017  4:24 PM Oliver Pila B wrote: Medication: traMADol (ULTRAM) 50 MG tablet [[id number]]   Has the patient contacted their pharmacy? Yes.   (Agent: If no, request that the patient contact the pharmacy for the refill.) (Agent: If yes, when and what did the pharmacy advise?)  Preferred Pharmacy (with phone number or street name): [facility]  Agent: Please be advised that RX refills may take up to 3 business days. We ask that you follow-up with your pharmacy.

## 2017-12-04 MED ORDER — TRAMADOL HCL 50 MG PO TABS: ORAL_TABLET | ORAL | 2 refills | Status: DC

## 2017-12-04 NOTE — Telephone Encounter (Signed)
Done erx 

## 2017-12-05 NOTE — Telephone Encounter (Signed)
MD approved and sent electronically to pof../lmb  

## 2017-12-11 ENCOUNTER — Encounter: Payer: Self-pay | Admitting: Internal Medicine

## 2017-12-11 ENCOUNTER — Ambulatory Visit (INDEPENDENT_AMBULATORY_CARE_PROVIDER_SITE_OTHER)
Admission: RE | Admit: 2017-12-11 | Discharge: 2017-12-11 | Disposition: A | Discharge status: Home / Self Care | Payer: Medicare HMO | Source: Ambulatory Visit | Attending: Internal Medicine | Admitting: Internal Medicine

## 2017-12-11 ENCOUNTER — Other Ambulatory Visit: Payer: Self-pay | Admitting: Internal Medicine

## 2017-12-11 ENCOUNTER — Ambulatory Visit (INDEPENDENT_AMBULATORY_CARE_PROVIDER_SITE_OTHER): Payer: Medicare HMO | Admitting: Internal Medicine

## 2017-12-11 VITALS — BP 104/70 | HR 94 | Temp 98.1°F | Ht 69.0 in | Wt 207.0 lb

## 2017-12-11 DIAGNOSIS — M25532 Pain in left wrist: Secondary | ICD-10-CM | POA: Diagnosis not present

## 2017-12-11 DIAGNOSIS — M25512 Pain in left shoulder: Secondary | ICD-10-CM

## 2017-12-11 DIAGNOSIS — Z23 Encounter for immunization: Secondary | ICD-10-CM

## 2017-12-11 DIAGNOSIS — G8929 Other chronic pain: Secondary | ICD-10-CM

## 2017-12-11 DIAGNOSIS — M19032 Primary osteoarthritis, left wrist: Secondary | ICD-10-CM | POA: Diagnosis not present

## 2017-12-11 MED ORDER — MELOXICAM 15 MG PO TABS: 15.0000 mg | ORAL_TABLET | Freq: Every day | ORAL | 0 refills | Status: DC

## 2017-12-11 NOTE — Progress Notes (Signed)
   Subjective:    Patient ID: Pamela [name], female    DOB: 02-06-1956, 62 y.o.   MRN: [id number]  HPI The patient is a 62 YO female coming in for left shoulder pain (acute on chronic, mentioned to pcp back about 4 months ago and he referred her to sports medicine but she did not go, fall about 1-2 months ago and fell onto her shoulder and this caused it to be much worse, hurts 8/10 and she has taken her tramadol for it which does take the edge off it, she denies taking muscle relaxers or nsaids (both on her med list) for it, she does have some restriction of motion, denies numbness in the shoulder, pain worse with activity), and left wrist pain (fell onto this wrist in the fall about 2 months or so ago, she cannot recall when exactly, she was at the grocery store and fell catching herself with left side, she did have pain about 1 day after fall in the wrist not immediately, it has swollen since that time, it is overall worsening, she has not taken anything for it, was thinking it would get better and the worsening made her seek care, denies dropping things, pain with movement of the wrist, never had this evaluated, denies having chronic pain in the wrist).   Review of Systems  Constitutional: Positive for activity change. Negative for appetite change, chills, fatigue, fever and unexpected weight change.  HENT: Negative.   Eyes: Negative.   Respiratory: Negative.  Negative for cough, chest tightness and shortness of breath.   Cardiovascular: Negative.  Negative for chest pain, palpitations and leg swelling.  Gastrointestinal: Negative.  Negative for abdominal distention, abdominal pain, constipation, diarrhea, nausea and vomiting.  Musculoskeletal: Positive for arthralgias and myalgias. Negative for back pain, gait problem and joint swelling.  Skin: Negative.   Neurological: Negative.   Psychiatric/Behavioral: Negative.       Objective:   Physical Exam  Constitutional: She is oriented to person,  place, and time. She appears well-developed and well-nourished.  HENT:  Head: Normocephalic and atraumatic.  Eyes: EOM are normal.  Neck: Normal range of motion.  Cardiovascular: Normal rate and regular rhythm.  Pulmonary/Chest: Effort normal and breath sounds normal. No respiratory distress. She has no wheezes. She has no rales.  Abdominal: Soft. Bowel sounds are normal. She exhibits no distension. There is no tenderness. There is no rebound.  Musculoskeletal: She exhibits edema and tenderness.  Swelling in the left wrist, she avoids rotating the wrist, able to extend and flex the fingers without pain, no pain in the ulna or radius in the forearm, elbow left with full ROM and no pain, left shoulder with some pain around the ac joint and in the shoulder with reduced ROM, no pain in the neck muscles or upper back muscles.   Neurological: She is alert and oriented to person, place, and time. Coordination normal.  Skin: Skin is warm and dry.   Vitals:   12/11/17 0902  BP: 104/70  Pulse: 94  Temp: 98.1 F (36.7 C)  TempSrc: Oral  SpO2: 97%  Weight: 207 lb (93.9 kg)  Height: 5\' 9"  (1.753 m)      Assessment & Plan:  Flu shot given at visit

## 2017-12-11 NOTE — Patient Instructions (Signed)
We are doing an x-ray of the shoulder and the wrist today. We will call with results.   Make a visit with the sports medicine doctor to help mend the pain and get you moving better again. If we need to send you to an orthopedic doctor we will call you.   We have sent in meloxicam to take 1 pill daily for pain. You should not take naproxen or aleve or advil or ibuprofen with this medicine.

## 2017-12-12 DIAGNOSIS — M25532 Pain in left wrist: Secondary | ICD-10-CM | POA: Insufficient documentation

## 2017-12-12 NOTE — Assessment & Plan Note (Addendum)
Checking x-ray shoulder given fall and worsening pain. On exam she is not tender to touch and suspect frozen shoulder. Refer to sports medicine and may need injection or PT. Rx for meloxicam for pain.

## 2017-12-12 NOTE — Assessment & Plan Note (Signed)
Ordered left wrist x-ray for fracture as she is swollen and tender on exam to touch and avoids movement of the wrist. Rx for meloxicam for pain and will have her see sports medicine.

## 2017-12-17 NOTE — Progress Notes (Signed)
[name] [address] [place], [place] [address] Phone: [phone] Subjective:   Pamela [name], am serving as a scribe for Dr. Hulan Saas. I'm seeing this patient by the request  of:    CC: Left shoulder and left wrist pain  XBM:[id number]  Pamela [name] is a 62 y.o. female coming in with complaint of left shoulder pain. She has been having chronic pain, constant ache. Did fall 2 months ago and this made pain worse. Pain over superior aspect of shoulder. Is also having numbness and swelling in the left hand and weakness. She also caught herself with the left hand when she fell. Has pain with flexion and ER. Uses MOBIC to sleep.      Past Medical History:  Diagnosis Date  . ALLERGIC RHINITIS 12/18/2006   Qualifier: Diagnosis of  By: [place], [place]    . Anxiety   . Arthritis   . DEPRESSION, SITUATIONAL 03/15/2007   Qualifier: Diagnosis of  By: Wynona Luna   . GERD (gastroesophageal reflux disease)   . Headache    occasional  . History of kidney stones   . HYPERLIPIDEMIA 12/18/2006   Qualifier: History of  By: Danny Lawless CMA, [place]    . Hypertension   . HYPERTENSION 12/18/2006   Qualifier: Diagnosis of  By: Danny Lawless CMA, [place]    . INSOMNIA-SLEEP DISORDER-UNSPEC 08/25/2009   Qualifier: Diagnosis of  By: Jenny Reichmann MD, Hunt Oris   . Pain in knee region after total knee replacement ([facility]) 04/03/2012  . Pneumonia   . RENAL CALCULUS, HX OF 12/18/2006   Qualifier: Diagnosis of  By: [place], [place]     Past Surgical History:  Procedure Laterality Date  . CERVICAL DISC SURGERY     x 3  . CONVERSION TO TOTAL KNEE Left 10/09/2016   Procedure: Conversion left uni compartment arthroplasty to total knee arthroplasty;  Surgeon: Paralee Cancel, MD;  Location: [facility];  Service: Orthopedics;  Laterality: Left;  90 mins  . KNEE ARTHROSCOPY     partial  . total left knee arthoplasty     10/09/16 Dr. Alvan Dame   Social History   Socioeconomic History  .  Marital status: Married    Spouse name: Not on file  . Number of children: 3  . Years of education: Not on file  . Highest education level: Not on file  Occupational History  . Not on file  Social Needs  . Financial resource strain: Not on file  . Food insecurity:    Worry: Not on file    Inability: Not on file  . Transportation needs:    Medical: Not on file    Non-medical: Not on file  Tobacco Use  . Smoking status: Former Research scientist (life sciences)  . Smokeless tobacco: Never Used  . Tobacco comment: 1970's  Substance and Sexual Activity  . Alcohol use: [name]    Alcohol/week: 0.0 standard drinks  . Drug use: [name]  . Sexual activity: Yes  Lifestyle  . Physical activity:    Days per week: Not on file    Minutes per session: Not on file  . Stress: Not on file  Relationships  . Social connections:    Talks on phone: Not on file    Gets together: Not on file    Attends religious service: Not on file    Active member of club or organization: Not on file    Attends meetings of clubs or organizations: Not on file  Relationship status: Not on file  Other Topics Concern  . Not on file  Social History Narrative  . Not on file   [name] Known Allergies Family History  Problem Relation Age of Onset  . Stroke Mother   . Heart disease Father   . Lung cancer Sister   . Lung cancer Brother      Current Outpatient Medications (Cardiovascular):  .  atorvastatin (LIPITOR) 20 MG tablet, TAKE 1 TABLET (20 MG TOTAL) BY MOUTH DAILY. Marland Kitchen  losartan-hydrochlorothiazide (HYZAAR) 50-12.5 MG tablet, Take 1 tablet by mouth daily.   Current Outpatient Medications (Analgesics):  .  aspirin 81 MG chewable tablet, Chew 1 tablet (81 mg total) by mouth 2 (two) times daily. Take for 4 weeks. .  meloxicam (MOBIC) 15 MG tablet, TAKE 1 TABLET(15 MG) BY MOUTH DAILY .  traMADol (ULTRAM) 50 MG tablet, take 1 tablet by mouth every 6 hours if needed for pain   Current Outpatient Medications (Other):  Marland Kitchen  ALPRAZolam (XANAX)  0.25 MG tablet, take 1 tablet by mouth twice a day if needed .  Camphor-Menthol-Methyl Sal (TIGER BALM MUSCLE RUB EX), Apply 1 application topically daily as needed (pain). .  cholecalciferol (VITAMIN D) 1000 units tablet, Take 1,000 Units by mouth daily. .  cyclobenzaprine (FLEXERIL) 5 MG tablet, take 1 tablet by mouth three times a day if needed for muscle spasms .  DULoxetine (CYMBALTA) 60 MG capsule, Take 1 capsule (60 mg total) by mouth daily. Marland Kitchen  gabapentin (NEURONTIN) 300 MG capsule,  .  metoCLOPramide (REGLAN) 10 MG tablet, Take 1 tablet (10 mg total) by mouth at bedtime. .  metoCLOPramide (REGLAN) 10 MG tablet, Take 1 tablet (10 mg total) by mouth 2 (two) times daily. .  pantoprazole (PROTONIX) 40 MG tablet, take 1 tablet by mouth once daily .  pantoprazole (PROTONIX) 40 MG tablet, Take 1 tablet (40 mg total) by mouth daily. Marland Kitchen  zolpidem (AMBIEN) 10 MG tablet, TAKE 1 TABLET BY MOUTH AT BEDTIME FOR SLEEP    Past medical history, social, surgical and family history all reviewed in electronic medical record.  [name] pertanent information unless stated regarding to the chief complaint.   Review of Systems:  [name] headache, visual changes, nausea, vomiting, diarrhea, constipation, dizziness, abdominal pain, skin rash, fevers, chills, night sweats, weight loss, swollen lymph nodes, body aches, joint swelling,, chest pain, shortness of breath, mood changes.  Positive muscle aches  Objective  Blood pressure 120/80, pulse 87, height 5\' 9"  (1.753 m), weight 207 lb (93.9 kg), SpO2 98 %.    General: [name] apparent distress alert and oriented x3 mood and affect normal, dressed appropriately.  HEENT: Pupils equal, extraocular movements intact  Respiratory: Patient's speak in full sentences and does not appear short of breath  Cardiovascular: [name] lower extremity edema, non tender, [name] erythema  Skin: Warm dry intact with [name] signs of infection or rash on extremities or on axial skeleton.  Abdomen: Soft  nontender  Neuro: Cranial nerves II through XII are intact, neurovascularly intact in all extremities with 2+ DTRs and 2+ pulses.  Lymph: [name] lymphadenopathy of posterior or anterior cervical chain or axillae bilaterally.  Gait normal with good balance and coordination.  MSK:  Non tender with full range of motion and good stability and symmetric strength and tone of  elbows, , hip, and ankles bilaterally.   Left wrist does have swelling.  Patient does have limited range of motion in all planes.  Tender to palpation over the TFCC.  Mild  tenderness over the scaphoid bone.  Crepitus noted. Shoulder: left Inspection reveals [name] abnormalities, atrophy or asymmetry. Palpation is normal with [name] tenderness over AC joint or bicipital groove. Limited range of motion in all planes Rotator cuff strength 3+ out of 5 compared to the contralateral side. signs of impingement with positive Neer and Hawkin's tests, but negative empty can sign. Positive O'Brien's Normal scapular function observed. Positive painful arc [name] apprehension sign Contralateral shoulder unremarkable  MSK US performed of: left This study was ordered, performed, and interpreted by Charlann Boxer D.O.  Shoulder:   Supraspinatus: Partial tear noted.  He does have some calcific changes at the insertion. AC joint:  Capsule undistended, [name] geyser sign. Glenohumeral Joint:  Appears normal without effusion. Glenoid Labrum: Degenerative tearing noted Biceps Tendon:  Appears normal on long and transverse views, [name] fraying of tendon, tendon located in intertubercular groove, [name] subluxation with shoulder internal or external rotation.  Impression: Partial tear with healing and [name] retraction  MSK US performed of: Left wrist This study was ordered, performed, and interpreted by Charlann Boxer D.O.  Wrist: Severe arthritic changes with increasing Doppler flow.  Patient does have a large TFCC tear noted.  Patient also has a disassociation of the  scaphoid lunate ligament.  IMPRESSION: Arthritis with questionable fractures and scaphoid lunate disassociation and TFCC tearing   97110; 15 additional minutes spent for Therapeutic exercises as stated in above notes.  This included exercises focusing on stretching, strengthening, with significant focus on eccentric aspects.   Long term goals include an improvement in range of motion, strength, endurance as well as avoiding reinjury. Patient's frequency would include in 1-2 times a day, 3-5 times a week for a duration of 6-12 weeks. Shoulder Exercises that included:  Basic scapular stabilization to include adduction and depression of scapula Scaption, focusing on proper movement and good control Internal and External rotation utilizing a theraband, with elbow tucked at side entire time Rows with theraband which was given   Proper technique shown and discussed handout in great detail with ATC.  All questions were discussed and answered.     Impression and Recommendations:     This case required medical decision making of moderate complexity. The above documentation has been reviewed and is accurate and complete Lyndal Pulley, DO       Note: This dictation was prepared with Dragon dictation along with smaller phrase technology. Any transcriptional errors that result from this process are unintentional.

## 2017-12-19 ENCOUNTER — Ambulatory Visit (INDEPENDENT_AMBULATORY_CARE_PROVIDER_SITE_OTHER): Payer: Medicare HMO | Admitting: Family Medicine

## 2017-12-19 ENCOUNTER — Ambulatory Visit: Payer: Self-pay

## 2017-12-19 VITALS — BP 120/80 | HR 87 | Ht 69.0 in | Wt 207.0 lb

## 2017-12-19 DIAGNOSIS — S46012A Strain of muscle(s) and tendon(s) of the rotator cuff of left shoulder, initial encounter: Secondary | ICD-10-CM | POA: Diagnosis not present

## 2017-12-19 DIAGNOSIS — M19032 Primary osteoarthritis, left wrist: Secondary | ICD-10-CM | POA: Diagnosis not present

## 2017-12-19 DIAGNOSIS — M25512 Pain in left shoulder: Secondary | ICD-10-CM

## 2017-12-19 DIAGNOSIS — G8929 Other chronic pain: Secondary | ICD-10-CM

## 2017-12-19 DIAGNOSIS — M75102 Unspecified rotator cuff tear or rupture of left shoulder, not specified as traumatic: Secondary | ICD-10-CM | POA: Insufficient documentation

## 2017-12-19 NOTE — Patient Instructions (Signed)
Good to see you.  Ice 20 minutes 2 times daily. Usually after activity and before bed. Exercises 3 times a week.  Keep hands within peripheral vision  Wear the brace on the wrist day and night for 2 weeks then nightly for 2 weeks pennsaid pinkie amount topically 2 times daily as needed.  See me again in 4 weeks

## 2017-12-19 NOTE — Assessment & Plan Note (Signed)
Arthritis of the left wrist.  Also an underlying TFCC tear.  In mobility noted.  Do not feel that repeating x-rays would not change further evaluation at the moment.  We will put in a removable thumb spica splint for now.  Discussed wearing it 23 hours a day for the next 2 weeks then nightly for another 2 weeks.  Discussed icing regimen, topical anti-inflammatories given, discussed worsening symptoms consider further imaging.  Follow-up again in 4 weeks

## 2017-12-19 NOTE — Assessment & Plan Note (Signed)
Partial tear but healing noted.  Discussed icing regimen and home exercise.  Discussed which activities to doing which wants to avoid.  Increase activity as tolerated.  Follow-up again in 4 to 8 weeks

## 2018-01-02 ENCOUNTER — Ambulatory Visit: Payer: Medicare HMO | Admitting: Gastroenterology

## 2018-01-07 ENCOUNTER — Other Ambulatory Visit: Payer: Self-pay | Admitting: Internal Medicine

## 2018-01-07 NOTE — Telephone Encounter (Signed)
Spoke with pharmacist at [facility] it was last filled 10/22/17 for a quantity of 90.   Pt count not be found on [place] controlled database. It showed last filled in 2017 but that is inaccurate.

## 2018-01-15 NOTE — Progress Notes (Deleted)
[facility] [address] [place], [place] [address] Phone: [phone] Subjective:    I'm seeing this patient by the request  of:    CC:   [id number]  LAURALI Pamela [name] is a 62 y.o. female coming in with complaint of ***  Onset-  Location Duration-  Character- Aggravating factors- Reliving factors-  Therapies tried-  Severity-     Past Medical History:  Diagnosis Date  . ALLERGIC RHINITIS 12/18/2006   Qualifier: Diagnosis of  By: [place], [place]    . Anxiety   . Arthritis   . DEPRESSION, SITUATIONAL 03/15/2007   Qualifier: Diagnosis of  By: Wynona Luna   . GERD (gastroesophageal reflux disease)   . Headache    occasional  . History of kidney stones   . HYPERLIPIDEMIA 12/18/2006   Qualifier: History of  By: Danny Lawless CMA, [place]    . Hypertension   . HYPERTENSION 12/18/2006   Qualifier: Diagnosis of  By: Danny Lawless CMA, [place]    . INSOMNIA-SLEEP DISORDER-UNSPEC 08/25/2009   Qualifier: Diagnosis of  By: Jenny Reichmann MD, Hunt Oris   . Pain in knee region after total knee replacement ([place]) 04/03/2012  . Pneumonia   . RENAL CALCULUS, HX OF 12/18/2006   Qualifier: Diagnosis of  By: [place], [place]     Past Surgical History:  Procedure Laterality Date  . CERVICAL DISC SURGERY     x 3  . CONVERSION TO TOTAL KNEE Left 10/09/2016   Procedure: Conversion left uni compartment arthroplasty to total knee arthroplasty;  Surgeon: Paralee Cancel, MD;  Location: [facility];  Service: Orthopedics;  Laterality: Left;  90 mins  . KNEE ARTHROSCOPY     partial  . total left knee arthoplasty     10/09/16 Dr. Alvan Dame   Social History   Socioeconomic History  . Marital status: Married    Spouse name: Not on file  . Number of children: 3  . Years of education: Not on file  . Highest education level: Not on file  Occupational History  . Not on file  Social Needs  . Financial resource strain: Not on file  . Food insecurity:    Worry: Not on file    Inability:  Not on file  . Transportation needs:    Medical: Not on file    Non-medical: Not on file  Tobacco Use  . Smoking status: Former Research scientist (life sciences)  . Smokeless tobacco: Never Used  . Tobacco comment: 1970's  Substance and Sexual Activity  . Alcohol use: No    Alcohol/week: 0.0 standard drinks  . Drug use: No  . Sexual activity: Yes  Lifestyle  . Physical activity:    Days per week: Not on file    Minutes per session: Not on file  . Stress: Not on file  Relationships  . Social connections:    Talks on phone: Not on file    Gets together: Not on file    Attends religious service: Not on file    Active member of club or organization: Not on file    Attends meetings of clubs or organizations: Not on file    Relationship status: Not on file  Other Topics Concern  . Not on file  Social History Narrative  . Not on file   No Known Allergies Family History  Problem Relation Age of Onset  . Stroke Mother   . Heart disease Father   . Lung cancer Sister   . Lung cancer  Brother      Current Outpatient Medications (Cardiovascular):  .  atorvastatin (LIPITOR) 20 MG tablet, TAKE 1 TABLET (20 MG TOTAL) BY MOUTH DAILY. Marland Kitchen  losartan-hydrochlorothiazide (HYZAAR) 50-12.5 MG tablet, Take 1 tablet by mouth daily.   Current Outpatient Medications (Analgesics):  .  aspirin 81 MG chewable tablet, Chew 1 tablet (81 mg total) by mouth 2 (two) times daily. Take for 4 weeks. .  meloxicam (MOBIC) 15 MG tablet, TAKE 1 TABLET(15 MG) BY MOUTH DAILY .  traMADol (ULTRAM) 50 MG tablet, take 1 tablet by mouth every 6 hours if needed for pain   Current Outpatient Medications (Other):  Marland Kitchen  ALPRAZolam (XANAX) 0.25 MG tablet, take 1 tablet by mouth twice a day if needed .  Camphor-Menthol-Methyl Sal (TIGER BALM MUSCLE RUB EX), Apply 1 application topically daily as needed (pain). .  cholecalciferol (VITAMIN D) 1000 units tablet, Take 1,000 Units by mouth daily. .  cyclobenzaprine (FLEXERIL) 5 MG tablet, take 1  tablet by mouth three times a day if needed for muscle spasms .  DULoxetine (CYMBALTA) 60 MG capsule, Take 1 capsule (60 mg total) by mouth daily. Marland Kitchen  gabapentin (NEURONTIN) 300 MG capsule,  .  metoCLOPramide (REGLAN) 10 MG tablet, Take 1 tablet (10 mg total) by mouth at bedtime. .  metoCLOPramide (REGLAN) 10 MG tablet, Take 1 tablet (10 mg total) by mouth 2 (two) times daily. .  pantoprazole (PROTONIX) 40 MG tablet, take 1 tablet by mouth once daily .  pantoprazole (PROTONIX) 40 MG tablet, Take 1 tablet (40 mg total) by mouth daily. Marland Kitchen  zolpidem (AMBIEN) 10 MG tablet, TAKE 1 TABLET BY MOUTH AT BEDTIME FOR SLEEP    Past medical history, social, surgical and family history all reviewed in electronic medical record.  No pertanent information unless stated regarding to the chief complaint.   Review of Systems:  No headache, visual changes, nausea, vomiting, diarrhea, constipation, dizziness, abdominal pain, skin rash, fevers, chills, night sweats, weight loss, swollen lymph nodes, body aches, joint swelling, muscle aches, chest pain, shortness of breath, mood changes.   Objective  There were no vitals taken for this visit. Systems examined below as of    General: No apparent distress alert and oriented x3 mood and affect normal, dressed appropriately.  HEENT: Pupils equal, extraocular movements intact  Respiratory: Patient's speak in full sentences and does not appear short of breath  Cardiovascular: No lower extremity edema, non tender, no erythema  Skin: Warm dry intact with no signs of infection or rash on extremities or on axial skeleton.  Abdomen: Soft nontender  Neuro: Cranial nerves II through XII are intact, neurovascularly intact in all extremities with 2+ DTRs and 2+ pulses.  Lymph: No lymphadenopathy of posterior or anterior cervical chain or axillae bilaterally.  Gait normal with good balance and coordination.  MSK:  Non tender with full range of motion and good stability and  symmetric strength and tone of shoulders, elbows, wrist, hip, knee and ankles bilaterally.     Impression and Recommendations:     This case required medical decision making of moderate complexity. The above documentation has been reviewed and is accurate and complete Lyndal Pulley, DO       Note: This dictation was prepared with Dragon dictation along with smaller phrase technology. Any transcriptional errors that result from this process are unintentional.

## 2018-01-16 ENCOUNTER — Ambulatory Visit: Payer: Medicare HMO | Admitting: Family Medicine

## 2018-02-03 ENCOUNTER — Other Ambulatory Visit: Payer: Self-pay | Admitting: Internal Medicine

## 2018-02-15 ENCOUNTER — Ambulatory Visit: Payer: Medicare HMO | Admitting: Internal Medicine

## 2018-02-21 ENCOUNTER — Ambulatory Visit (INDEPENDENT_AMBULATORY_CARE_PROVIDER_SITE_OTHER): Payer: Medicare HMO | Admitting: Internal Medicine

## 2018-02-21 ENCOUNTER — Encounter: Payer: Self-pay | Admitting: Internal Medicine

## 2018-02-21 ENCOUNTER — Other Ambulatory Visit: Payer: Medicare HMO

## 2018-02-21 ENCOUNTER — Ambulatory Visit (INDEPENDENT_AMBULATORY_CARE_PROVIDER_SITE_OTHER)
Admission: RE | Admit: 2018-02-21 | Discharge: 2018-02-21 | Disposition: A | Discharge status: Home / Self Care | Payer: Medicare HMO | Source: Ambulatory Visit | Attending: Internal Medicine | Admitting: Internal Medicine

## 2018-02-21 VITALS — BP 126/88 | HR 90 | Temp 98.3°F | Ht 69.0 in | Wt 212.0 lb

## 2018-02-21 DIAGNOSIS — E785 Hyperlipidemia, unspecified: Secondary | ICD-10-CM | POA: Diagnosis not present

## 2018-02-21 DIAGNOSIS — I10 Essential (primary) hypertension: Secondary | ICD-10-CM | POA: Diagnosis not present

## 2018-02-21 DIAGNOSIS — S6992XA Unspecified injury of left wrist, hand and finger(s), initial encounter: Secondary | ICD-10-CM | POA: Diagnosis not present

## 2018-02-21 DIAGNOSIS — M25532 Pain in left wrist: Secondary | ICD-10-CM

## 2018-02-21 DIAGNOSIS — R739 Hyperglycemia, unspecified: Secondary | ICD-10-CM | POA: Diagnosis not present

## 2018-02-21 DIAGNOSIS — M7061 Trochanteric bursitis, right hip: Secondary | ICD-10-CM

## 2018-02-21 DIAGNOSIS — M7989 Other specified soft tissue disorders: Secondary | ICD-10-CM | POA: Diagnosis not present

## 2018-02-21 MED ORDER — TRAMADOL HCL 50 MG PO TABS: ORAL_TABLET | ORAL | 2 refills | Status: DC

## 2018-02-21 NOTE — Assessment & Plan Note (Signed)
?   Ganglion cyst vs post trauma vs other - for xray toda, refer hand surgury as likely would benefit from cortisone, gave work note for light duty for 3 wks

## 2018-02-21 NOTE — Assessment & Plan Note (Signed)
stable overall by history and exam, recent data reviewed with pt, and pt to continue medical treatment as before,  to f/u any worsening symptoms or concerns  

## 2018-02-21 NOTE — Progress Notes (Signed)
Subjective:    Patient ID: Pamela [name], female    DOB: [birth year], 62 y.o.   MRN: [id number]  HPI  Here with 3 wks onset gradually worsening mild to now mod left wrist pain and a localized tender area at the lateral post wrist, without overlying skin changes.  Did have a fall at the onset where she caught herself with her hands, but did not seem to have severe pain to the wrists or swelling after.  No radiation of the pain, is constant, worse to move the wrist, nothing else makes bettter.  Also c/o tender pain x 2-3 wks gradually worsening over the right greater trochanter area, constant, mild to mod, worse to lie on this at night, Pt denies chest pain, increased sob or doe, wheezing, orthopnea, PND, increased LE swelling, palpitations, dizziness or syncope.  Pt denies new neurological symptoms such as new headache, or facial or extremity weakness or numbness   Pt denies polydipsia, polyuria Past Medical History:  Diagnosis Date  . ALLERGIC RHINITIS 12/18/2006   Qualifier: Diagnosis of  By: [place], [place]    . Anxiety   . Arthritis   . DEPRESSION, SITUATIONAL 03/15/2007   Qualifier: Diagnosis of  By: Wynona Luna   . GERD (gastroesophageal reflux disease)   . Headache    occasional  . History of kidney stones   . HYPERLIPIDEMIA 12/18/2006   Qualifier: History of  By: Danny Lawless CMA, [place]    . Hypertension   . HYPERTENSION 12/18/2006   Qualifier: Diagnosis of  By: Danny Lawless CMA, [place]    . INSOMNIA-SLEEP DISORDER-UNSPEC 08/25/2009   Qualifier: Diagnosis of  By: Jenny Reichmann MD, Hunt Oris   . Pain in knee region after total knee replacement ([place]) 04/03/2012  . Pneumonia   . RENAL CALCULUS, HX OF 12/18/2006   Qualifier: Diagnosis of  By: [place], [place]     Past Surgical History:  Procedure Laterality Date  . CERVICAL DISC SURGERY     x 3  . CONVERSION TO TOTAL KNEE Left 10/09/2016   Procedure: Conversion left uni compartment arthroplasty to total knee arthroplasty;  Surgeon: Paralee Cancel, MD;  Location: [facility];  Service: Orthopedics;  Laterality: Left;  90 mins  . KNEE ARTHROSCOPY     partial  . total left knee arthoplasty     10/09/16 Dr. Alvan Dame    reports that she has quit smoking. She has never used smokeless tobacco. She reports that she does not drink alcohol or use drugs. family history includes Heart disease in her father; Lung cancer in her brother and sister; Stroke in her mother. No Known Allergies Current Outpatient Medications on File Prior to Visit  Medication Sig Dispense Refill  . ALPRAZolam (XANAX) 0.25 MG tablet take 1 tablet by mouth twice a day if needed 60 tablet 5  . aspirin 81 MG chewable tablet Chew 1 tablet (81 mg total) by mouth 2 (two) times daily. Take for 4 weeks. 60 tablet 0  . atorvastatin (LIPITOR) 20 MG tablet TAKE 1 TABLET (20 MG TOTAL) BY MOUTH DAILY. 90 tablet 3  . Camphor-Menthol-Methyl Sal (TIGER BALM MUSCLE RUB EX) Apply 1 application topically daily as needed (pain).    . cholecalciferol (VITAMIN D) 1000 units tablet Take 1,000 Units by mouth daily.    . cyclobenzaprine (FLEXERIL) 5 MG tablet take 1 tablet by mouth three times a day if needed for muscle spasms 30 tablet 0  . DULoxetine (CYMBALTA) 60 MG capsule Take 1 capsule (  60 mg total) by mouth daily. 90 capsule 3  . gabapentin (NEURONTIN) 300 MG capsule   0  . losartan-hydrochlorothiazide (HYZAAR) 50-12.5 MG tablet TAKE 1 TABLET BY MOUTH DAILY 90 tablet 0  . meloxicam (MOBIC) 15 MG tablet TAKE 1 TABLET(15 MG) BY MOUTH DAILY 90 tablet 0  . metoCLOPramide (REGLAN) 10 MG tablet Take 1 tablet (10 mg total) by mouth at bedtime. 30 tablet 0  . metoCLOPramide (REGLAN) 10 MG tablet Take 1 tablet (10 mg total) by mouth 2 (two) times daily. 60 tablet 11  . pantoprazole (PROTONIX) 40 MG tablet take 1 tablet by mouth once daily 90 tablet 1  . pantoprazole (PROTONIX) 40 MG tablet Take 1 tablet (40 mg total) by mouth daily. 30 tablet 11  . zolpidem (AMBIEN) 10 MG tablet TAKE 1 TABLET BY  MOUTH AT BEDTIME FOR SLEEP 90 tablet 0   No current facility-administered medications on file prior to visit.    Review of Systems  Constitutional: Negative for other unusual diaphoresis or sweats HENT: Negative for ear discharge or swelling Eyes: Negative for other worsening visual disturbances Respiratory: Negative for stridor or other swelling  Gastrointestinal: Negative for worsening distension or other blood Genitourinary: Negative for retention or other urinary change Musculoskeletal: Negative for other MSK pain or swelling Skin: Negative for color change or other new lesions Neurological: Negative for worsening tremors and other numbness  Psychiatric/Behavioral: Negative for worsening agitation or other fatigue All other system neg per pt    Objective:   Physical Exam BP 126/88   Pulse 90   Temp 98.3 F (36.8 C) (Oral)   Ht 5\' 9"  (1.753 m)   Wt 212 lb (96.2 kg)   SpO2 97%   BMI 31.31 kg/m  VS noted, not ill appearing Constitutional: Pt appears in NAD HENT: Head: NCAT.  Right Ear: External ear normal.  Left Ear: External ear normal.  Eyes: . Pupils are equal, round, and reactive to light. Conjunctivae and EOM are normal Nose: without d/c or deformity Neck: Neck supple. Gross normal ROM Cardiovascular: Normal rate and regular rhythm.   Pulmonary/Chest: Effort normal and breath sounds without rales or wheezing.  Abd:  Soft, NT, ND, + BS, no organomegaly Right greater trochanter + tender without swelling Left wrist post lateral aspect with 1cm are sub1 cystic like mass tender without overlying skin change Neurological: Pt is alert. At baseline orientation, motor grossly intact Skin: Skin is warm. No rashes, other new lesions, no LE edema Psychiatric: Pt behavior is normal without agitation  No other exam findings Lab Results  Component Value Date   WBC 7.4 08/22/2017   HGB 10.9 (L) 08/22/2017   HCT 33.0 (L) 08/22/2017   PLT 251.0 08/22/2017   GLUCOSE 102 (H)  08/22/2017   CHOL 160 08/22/2017   TRIG 37.0 08/22/2017   HDL 72.80 08/22/2017   LDLDIRECT 109.9 04/03/2012   LDLCALC 80 08/22/2017   ALT 11 08/22/2017   AST 13 08/22/2017   NA 140 08/22/2017   K 3.5 08/22/2017   CL 104 08/22/2017   CREATININE 0.73 08/22/2017   BUN 16 08/22/2017   CO2 30 08/22/2017   TSH 0.87 08/22/2017   INR 1.0 06/29/2008   HGBA1C 5.5 08/22/2017        Assessment & Plan:

## 2018-02-21 NOTE — Patient Instructions (Addendum)
Please take all new medication as prescribed - the tramadol for pain  Please continue all other medications as before, and refills have been done if requested.  Please have the pharmacy call with any other refills you may need.  Please continue your efforts at being more active, low cholesterol diet, and weight control.  Please keep your appointments with your specialists as you may have planned  You will be contacted regarding the referral for: Hand Surgury for the left wrist, and Dr Tamala Julian for the right hip  Please return in 6 months, or sooner if needed, with Lab testing done 3-5 days before

## 2018-02-21 NOTE — Assessment & Plan Note (Signed)
Moderate, for refill tramadol prn, refer sports medicine as likely would benefit from cortisone

## 2018-03-07 ENCOUNTER — Ambulatory Visit (INDEPENDENT_AMBULATORY_CARE_PROVIDER_SITE_OTHER): Payer: Medicare HMO | Admitting: Physician Assistant

## 2018-03-14 ENCOUNTER — Encounter (INDEPENDENT_AMBULATORY_CARE_PROVIDER_SITE_OTHER): Payer: Self-pay | Admitting: Physician Assistant

## 2018-03-14 ENCOUNTER — Ambulatory Visit (INDEPENDENT_AMBULATORY_CARE_PROVIDER_SITE_OTHER): Payer: Medicare HMO | Admitting: Physician Assistant

## 2018-03-14 DIAGNOSIS — M19032 Primary osteoarthritis, left wrist: Secondary | ICD-10-CM

## 2018-03-14 MED ORDER — DICLOFENAC SODIUM 1 % TD GEL: 2.0000 g | Freq: Four times a day (QID) | TRANSDERMAL | 2 refills | Status: DC

## 2018-03-14 NOTE — Addendum Note (Signed)
Addended by: Precious Bard on: 03/14/2018 10:21 AM   Modules accepted: Orders

## 2018-03-14 NOTE — Progress Notes (Signed)
Office Visit Note   Patient: Pamela [name]           Date of Birth: [birth year]           MRN: [id number] Visit Date: 03/14/2018              Requested by: Biagio Borg, MD [place] [place], [place] [address] PCP: Biagio Borg, MD   Assessment & Plan: Visit Diagnoses:  1. Arthritis of left wrist     Plan: Impression is questionable autoimmune disease versus osteoarthritis left wrist.  Will obtain an autoimmune panel today.  We will place the patient in a new removable splint to wear for comfort.  I will call in Voltaren gel to use as needed.  Follow-up with [place] in 1 week's time for recheck.  Follow-Up Instructions: Return in about 1 week (around 03/21/2018).   Orders:  No orders of the defined types were placed in this encounter.  Meds ordered this encounter  Medications  . diclofenac sodium (VOLTAREN) 1 % GEL    Sig: Apply 2 g topically 4 (four) times daily.    Dispense:  1 Tube    Refill:  2      Procedures: No procedures performed   Clinical Data: No additional findings.   Subjective: Chief Complaint  Patient presents with  . Left Wrist - Pain    HPI patient is a pleasant 63 year old female who presents to our clinic today with left wrist pain.  This began after a mechanical fall in August of this past year.  She was seen in the ED where x-rays were obtained.  No acute or structural abnormalities noted.  She has had continued pain to the left wrist since.  This is described as a diffuse ache worse with any motion of the wrist.  She does have associated cramping.  She was given a wrist brace which is actually made things worse.  She does note occasional tingling and burning to all 5 digits.  No history of autoimmune disease in her or her family that she is aware of.  Review of Systems as detailed in HPI.  All others reviewed and are negative.   Objective: Vital Signs: There were no vitals taken for this visit.  Physical Exam well-developed  well-nourished female no acute distress.  Alert and oriented  Ortho Exam examination left wrist reveals diffuse swelling.  Diffuse tenderness.  Increased pain with range of motion in all planes.  No ulnar deviation.  She is neurovascularly intact distally.  Specialty Comments:  No specialty comments available.  Imaging: X-rays reviewed by me in canopy reveal diffuse arthritic change with questionable cystic changes versus erosions at the ulnar styloid process and base of the second metacarpal.   PMFS History: Patient Active Problem List   Diagnosis Date Noted  . 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 Left rotator cuff tear 12/19/2017  . Left wrist pain 12/12/2017  . Left shoulder pain 08/22/2017  . Chronic pain of right knee 08/22/2017  . Acute pain of right knee 04/17/2017  . Pain of left calf 04/13/2017  . 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 Hyperglycemia 09/23/2015  . Anxiety state 09/23/2015  . Abnormal urine odor 08/25/2015  . GERD (gastroesophageal reflux disease) 08/25/2015  . Upper abdominal pain 04/16/2015  . Early satiety 04/16/2015  . Dysuria 04/16/2015  . Encounter for well adult exam with abnormal findings 06/18/2014  . Greater trochanteric bursitis of right hip 01/06/2013  .  Degenerative arthritis of left knee 09/10/2012  . Left lumbar radiculitis 04/03/2012  . Eczema 04/03/2012  . 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 BURSITIS, RIGHT HIP 08/25/2009  . INSOMNIA-SLEEP DISORDER-UNSPEC 08/25/2009  . FATIGUE 08/25/2009  . Cervicalgia 08/14/2007  . Depression 03/15/2007  . HLD (hyperlipidemia) 12/18/2006  . Essential hypertension 12/18/2006  . ALLERGIC RHINITIS 12/18/2006  . RENAL CALCULUS, HX OF 12/18/2006   Past Medical History:  Diagnosis Date  . ALLERGIC RHINITIS 12/18/2006   Qualifier: Diagnosis of  By: [place], [place]    . Anxiety   . Arthritis   . DEPRESSION, SITUATIONAL 03/15/2007   Qualifier: Diagnosis of  By: Wynona Luna   . GERD (gastroesophageal  reflux disease)   . Headache    occasional  . History of kidney stones   . HYPERLIPIDEMIA 12/18/2006   Qualifier: History of  By: Danny Lawless CMA, [place]    . Hypertension   . HYPERTENSION 12/18/2006   Qualifier: Diagnosis of  By: Danny Lawless CMA, [place]    . INSOMNIA-SLEEP DISORDER-UNSPEC 08/25/2009   Qualifier: Diagnosis of  By: Jenny Reichmann MD, Hunt Oris   . Pain in knee region after total knee replacement ([place]) 04/03/2012  . Pneumonia   . RENAL CALCULUS, HX OF 12/18/2006   Qualifier: Diagnosis of  By: Danny Lawless CMA, [place]      Family History  Problem Relation Age of Onset  . Stroke Mother   . Heart disease Father   . Lung cancer Sister   . Lung cancer Brother     Past Surgical History:  Procedure Laterality Date  . CERVICAL DISC SURGERY     x 3  . CONVERSION TO TOTAL KNEE Left 10/09/2016   Procedure: Conversion left uni compartment arthroplasty to total knee arthroplasty;  Surgeon: Paralee Cancel, MD;  Location: [facility];  Service: Orthopedics;  Laterality: Left;  90 mins  . KNEE ARTHROSCOPY     partial  . total left knee arthoplasty     10/09/16 Dr. Alvan Dame   Social History   Occupational History  . Not on file  Tobacco Use  . Smoking status: Former Research scientist (life sciences)  . Smokeless tobacco: Never Used  . Tobacco comment: 1970's  Substance and Sexual Activity  . Alcohol use: No    Alcohol/week: 0.0 standard drinks  . Drug use: No  . Sexual activity: Yes

## 2018-03-15 NOTE — Progress Notes (Signed)
Can you refer to deveshwar please

## 2018-03-16 LAB — ANTI-NUCLEAR AB-TITER (ANA TITER): ANA Titer 1: 1:160 {titer} — ABNORMAL HIGH

## 2018-03-16 LAB — ANA: ANA: POSITIVE — AB

## 2018-03-16 LAB — URIC ACID: Uric Acid, Serum: 5.3 mg/dL (ref 2.5–7.0)

## 2018-03-16 LAB — SEDIMENTATION RATE: Sed Rate: 22 mm/h (ref 0–30)

## 2018-03-16 LAB — RHEUMATOID FACTOR: Rhuematoid fact SerPl-aCnc: 116 IU/mL — ABNORMAL HIGH (ref <14)

## 2018-03-19 ENCOUNTER — Telehealth (INDEPENDENT_AMBULATORY_CARE_PROVIDER_SITE_OTHER): Payer: Self-pay | Admitting: Orthopaedic Surgery

## 2018-03-19 NOTE — Telephone Encounter (Signed)
Patient called and stated she was wanting her lab results.  Please call patient to advise.

## 2018-03-20 ENCOUNTER — Other Ambulatory Visit (INDEPENDENT_AMBULATORY_CARE_PROVIDER_SITE_OTHER): Payer: Self-pay

## 2018-03-20 DIAGNOSIS — M19032 Primary osteoarthritis, left wrist: Secondary | ICD-10-CM

## 2018-03-20 NOTE — Telephone Encounter (Signed)
See message below °

## 2018-03-20 NOTE — Telephone Encounter (Signed)
Can you let her know that her lab work was elevated and it looks like she may have an autoimmune disease.  This is why we are referring her to dr. Estanislado Pandy

## 2018-03-21 ENCOUNTER — Ambulatory Visit (INDEPENDENT_AMBULATORY_CARE_PROVIDER_SITE_OTHER): Payer: Medicare HMO | Admitting: Orthopaedic Surgery

## 2018-03-21 ENCOUNTER — Telehealth (INDEPENDENT_AMBULATORY_CARE_PROVIDER_SITE_OTHER): Payer: Self-pay | Admitting: Orthopaedic Surgery

## 2018-03-21 NOTE — Telephone Encounter (Signed)
Called  patient no answer. Just need to advise on message below.  

## 2018-03-21 NOTE — Telephone Encounter (Signed)
Patient called back. And I advised on message/ labwork.

## 2018-03-21 NOTE — Telephone Encounter (Signed)
See previous message

## 2018-03-21 NOTE — Telephone Encounter (Signed)
Patent called and stated that she wanted her lab results.  Please call patient with results.

## 2018-03-22 ENCOUNTER — Ambulatory Visit: Payer: Medicare HMO | Admitting: Family Medicine

## 2018-03-22 ENCOUNTER — Ambulatory Visit (INDEPENDENT_AMBULATORY_CARE_PROVIDER_SITE_OTHER): Payer: Medicare HMO

## 2018-03-22 DIAGNOSIS — Z23 Encounter for immunization: Secondary | ICD-10-CM

## 2018-03-22 DIAGNOSIS — Z299 Encounter for prophylactic measures, unspecified: Secondary | ICD-10-CM

## 2018-03-26 ENCOUNTER — Ambulatory Visit: Payer: Medicare HMO

## 2018-04-05 ENCOUNTER — Ambulatory Visit (INDEPENDENT_AMBULATORY_CARE_PROVIDER_SITE_OTHER): Payer: Medicare HMO

## 2018-04-05 ENCOUNTER — Encounter (INDEPENDENT_AMBULATORY_CARE_PROVIDER_SITE_OTHER): Payer: Self-pay | Admitting: Physician Assistant

## 2018-04-05 ENCOUNTER — Ambulatory Visit (INDEPENDENT_AMBULATORY_CARE_PROVIDER_SITE_OTHER): Payer: Medicare HMO | Admitting: Orthopaedic Surgery

## 2018-04-05 DIAGNOSIS — M1711 Unilateral primary osteoarthritis, right knee: Secondary | ICD-10-CM

## 2018-04-05 MED ORDER — METHYLPREDNISOLONE ACETATE 40 MG/ML IJ SUSP
40.0000 mg | INTRAMUSCULAR | Status: AC | PRN
  Administered 2018-04-05: 40 mg via INTRA_ARTICULAR

## 2018-04-05 MED ORDER — LIDOCAINE HCL 1 % IJ SOLN
2.0000 mL | INTRAMUSCULAR | Status: AC | PRN
  Administered 2018-04-05: 2 mL

## 2018-04-05 MED ORDER — BUPIVACAINE HCL 0.25 % IJ SOLN
2.0000 mL | INTRAMUSCULAR | Status: AC | PRN
  Administered 2018-04-05: 2 mL via INTRA_ARTICULAR

## 2018-04-05 NOTE — Progress Notes (Signed)
Office Visit Note   Patient: Pamela [name]           Date of Birth: 10-Jun-1955           MRN: [id number] Visit Date: 04/05/2018              Requested by: Biagio Borg, MD [place], [place] [id number] PCP: Biagio Borg, MD   Assessment & Plan: Visit Diagnoses:  1. Primary osteoarthritis of right knee     Plan: Impression is right knee end-stage degenerative joint disease.  We will reinject the right knee with cortisone today.  I provided the patient with a viscosupplementation handout as well.  She will call us and let us know if she would like to proceed with approval for that injection should her cortisone injection failed to relieve her symptoms.  Otherwise, follow-up with [place] as needed.  Follow-Up Instructions: Return if symptoms worsen or fail to improve.   Orders:  Orders Placed This Encounter  Procedures  . Large Joint Inj: R knee  . XR KNEE 3 VIEW RIGHT   No orders of the defined types were placed in this encounter.     Procedures: Large Joint Inj: R knee on 04/05/2018 1:49 PM Indications: pain Details: 22 G needle, anterolateral approach Medications: 2 mL lidocaine 1 %; 2 mL bupivacaine 0.25 %; 40 mg methylPREDNISolone acetate 40 MG/ML      Clinical Data: No additional findings.   Subjective: Chief Complaint  Patient presents with  . Right Knee - Pain    HPI Pamela [name] is a pleasant 63 year old female who presents to our clinic today with right knee pain.  This is been ongoing for the past 2 years and has progressively worsened.  Pain she has primarily to the medial aspect.  She has associated locking catching.  Pain is worse going from a seated to standing position as well as walking and going up and down stairs.  She has been taking tramadol with mild relief of symptoms.  She does note a previous cortisone injection to the right knee this past summer with significant relief until recently.  Of note, she is status post left total knee  replacement in July 2019 by Dr. Ihor Gully.  Doing well there.  Review of Systems as detailed in HPI.  All others reviewed and are negative.   Objective: Vital Signs: There were no vitals taken for this visit.  Physical Exam well-developed well-nourished female no acute distress.  Alert and oriented x3.  Ortho Exam examination of the right knee shows trace effusion.  Range of motion 0 to 115 degrees.  Medial joint line tenderness.  Collaterals are stable.  Moderate patellofemoral crepitus.  She is neurovascular intact distally.  Specialty Comments:  No specialty comments available.  Imaging: Xr Knee 3 View Right  Result Date: 04/05/2018 X-rays of the right knee show bone-on-bone medial patellofemoral compartments with periarticular    PMFS History: Patient Active Problem List   Diagnosis Date Noted  . Primary osteoarthritis of right knee 04/05/2018  . 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 Left rotator cuff tear 12/19/2017  . Left wrist pain 12/12/2017  . Left shoulder pain 08/22/2017  . Chronic pain of right knee 08/22/2017  . Acute pain of right knee 04/17/2017  . Pain of left calf 04/13/2017  . 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 Hyperglycemia 09/23/2015  . Anxiety state 09/23/2015  . Abnormal urine odor 08/25/2015  . GERD (gastroesophageal reflux  disease) 08/25/2015  . Upper abdominal pain 04/16/2015  . Early satiety 04/16/2015  . Dysuria 04/16/2015  . Encounter for well adult exam with abnormal findings 06/18/2014  . Greater trochanteric bursitis of right hip 01/06/2013  . Degenerative arthritis of left knee 09/10/2012  . Left lumbar radiculitis 04/03/2012  . Eczema 04/03/2012  . 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 BURSITIS, RIGHT HIP 08/25/2009  . INSOMNIA-SLEEP DISORDER-UNSPEC 08/25/2009  . FATIGUE 08/25/2009  . Cervicalgia 08/14/2007  . Depression 03/15/2007  . HLD (hyperlipidemia) 12/18/2006  . Essential hypertension 12/18/2006  . ALLERGIC RHINITIS 12/18/2006  . RENAL  CALCULUS, HX OF 12/18/2006   Past Medical History:  Diagnosis Date  . ALLERGIC RHINITIS 12/18/2006   Qualifier: Diagnosis of  By: [place], [place]    . Anxiety   . Arthritis   . DEPRESSION, SITUATIONAL 03/15/2007   Qualifier: Diagnosis of  By: Wynona Luna   . GERD (gastroesophageal reflux disease)   . Headache    occasional  . History of kidney stones   . HYPERLIPIDEMIA 12/18/2006   Qualifier: History of  By: Danny Lawless CMA, [place]    . Hypertension   . HYPERTENSION 12/18/2006   Qualifier: Diagnosis of  By: Danny Lawless CMA, [place]    . INSOMNIA-SLEEP DISORDER-UNSPEC 08/25/2009   Qualifier: Diagnosis of  By: Jenny Reichmann MD, Hunt Oris   . Pain in knee region after total knee replacement ([place]) 04/03/2012  . Pneumonia   . RENAL CALCULUS, HX OF 12/18/2006   Qualifier: Diagnosis of  By: Danny Lawless CMA, [place]      Family History  Problem Relation Age of Onset  . Stroke Mother   . Heart disease Father   . Lung cancer Sister   . Lung cancer Brother     Past Surgical History:  Procedure Laterality Date  . CERVICAL DISC SURGERY     x 3  . CONVERSION TO TOTAL KNEE Left 10/09/2016   Procedure: Conversion left uni compartment arthroplasty to total knee arthroplasty;  Surgeon: Paralee Cancel, MD;  Location: [facility];  Service: Orthopedics;  Laterality: Left;  90 mins  . KNEE ARTHROSCOPY     partial  . total left knee arthoplasty     10/09/16 Dr. Alvan Dame   Social History   Occupational History  . Not on file  Tobacco Use  . Smoking status: Former Research scientist (life sciences)  . Smokeless tobacco: Never Used  . Tobacco comment: 1970's  Substance and Sexual Activity  . Alcohol use: No    Alcohol/week: 0.0 standard drinks  . Drug use: No  . Sexual activity: Yes

## 2018-04-08 ENCOUNTER — Other Ambulatory Visit: Payer: Self-pay | Admitting: Internal Medicine

## 2018-04-08 NOTE — Telephone Encounter (Signed)
Done erx 

## 2018-04-12 NOTE — Progress Notes (Signed)
Office Visit Note  Patient: Pamela [name]             Date of Birth: [birth year]           MRN: [id number]             PCP: Biagio Borg, MD Referring: Nathaniel Man Visit Date: 04/26/2018 Occupation: retired CNA  Subjective:  Left wrist pain.   History of Present Illness: MICHELENE KENISTON is a 63 y.o. female in consultation per request of her PCP.  Patient is a retired Quarry manager.  She states she has had arthritis in her joints for many years.  She was diagnosed with osteoarthritis in her knees about 10 years ago.  She states she initially had partial left knee replacement and 2 years ago total left knee replacement.  She was also diagnosed with right knee joint osteoarthritis and had cortisone injection about 3 months ago.  She has had rotator cuff tendinopathy in her left shoulder which causes chronic discomfort.  She states in August 2019 she fell on her left outstretched hand and developed pain and swelling in her left wrist joint.  She states she went to the emergency room where her x-rays were normal but the swelling persisted.  She went to see her PCP and then was referred to Dr. Erlinda Hong.  According to her he did x-rays and gave her a brace.  She also had some labs which came positive for rheumatoid factor and elevated sedimentation rate and she was referred to me.  She states the pain and swelling in her left wrist persist.  None of the other joints are swollen or painful at this time.  She has discomfort in her lower back for many years which is on and off.  Activities of Daily Living:  Patient reports morning stiffness for 2 hours.   Patient Denies nocturnal pain.  Difficulty dressing/grooming: Reports Difficulty climbing stairs: Reports Difficulty getting out of chair: Reports Difficulty using hands for taps, buttons, cutlery, and/or writing: Reports  Review of Systems  Constitutional: Positive for fatigue. Negative for night sweats, weight gain and weight loss.  HENT: Negative for  mouth sores, trouble swallowing, trouble swallowing, mouth dryness and nose dryness.   Eyes: Negative for pain, redness, visual disturbance and dryness.  Respiratory: Negative for cough, shortness of breath and difficulty breathing.   Cardiovascular: Negative for chest pain, palpitations, hypertension, irregular heartbeat and swelling in legs/feet.  Gastrointestinal: Negative for blood in stool, constipation and diarrhea.  Endocrine: Negative for increased urination.  Genitourinary: Negative for vaginal dryness.  Musculoskeletal: Positive for arthralgias, joint pain, joint swelling and morning stiffness. Negative for myalgias, muscle weakness, muscle tenderness and myalgias.  Skin: Negative for color change, rash, hair loss, skin tightness, ulcers and sensitivity to sunlight.  Allergic/Immunologic: Negative for susceptible to infections.  Neurological: Negative for dizziness, memory loss, night sweats and weakness.  Hematological: Negative for swollen glands.  Psychiatric/Behavioral: Positive for depressed mood and sleep disturbance. The patient is nervous/anxious.     PMFS History:  Patient Active Problem List   Diagnosis Date Noted  . Primary osteoarthritis of right knee 04/05/2018  . 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 Left rotator cuff tear 12/19/2017  . Left wrist pain 12/12/2017  . Left shoulder pain 08/22/2017  . Chronic pain of right knee 08/22/2017  . Acute pain of right knee 04/17/2017  . Pain of left calf 04/13/2017  . 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 Hyperglycemia 09/23/2015  .  Anxiety state 09/23/2015  . Abnormal urine odor 08/25/2015  . GERD (gastroesophageal reflux disease) 08/25/2015  . Upper abdominal pain 04/16/2015  . Early satiety 04/16/2015  . Dysuria 04/16/2015  . Encounter for well adult exam with abnormal findings 06/18/2014  . Greater trochanteric bursitis of right hip 01/06/2013  . Degenerative arthritis of left knee 09/10/2012  . Left lumbar  radiculitis 04/03/2012  . Eczema 04/03/2012  . 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 BURSITIS, RIGHT HIP 08/25/2009  . INSOMNIA-SLEEP DISORDER-UNSPEC 08/25/2009  . FATIGUE 08/25/2009  . Cervicalgia 08/14/2007  . Depression 03/15/2007  . HLD (hyperlipidemia) 12/18/2006  . Essential hypertension 12/18/2006  . ALLERGIC RHINITIS 12/18/2006  . RENAL CALCULUS, HX OF 12/18/2006    Past Medical History:  Diagnosis Date  . ALLERGIC RHINITIS 12/18/2006   Qualifier: Diagnosis of  By: [place], [place]    . Anxiety   . Arthritis   . DEPRESSION, SITUATIONAL 03/15/2007   Qualifier: Diagnosis of  By: Wynona Luna   . GERD (gastroesophageal reflux disease)   . Headache    occasional  . History of kidney stones   . HYPERLIPIDEMIA 12/18/2006   Qualifier: History of  By: Danny Lawless CMA, [place]    . Hypertension   . HYPERTENSION 12/18/2006   Qualifier: Diagnosis of  By: Danny Lawless CMA, [place]    . INSOMNIA-SLEEP DISORDER-UNSPEC 08/25/2009   Qualifier: Diagnosis of  By: Jenny Reichmann MD, Hunt Oris   . Pain in knee region after total knee replacement ([place]) 04/03/2012  . Pneumonia   . RENAL CALCULUS, HX OF 12/18/2006   Qualifier: Diagnosis of  By: Danny Lawless CMA, [place]      Family History  Problem Relation Age of Onset  . Stroke Mother   . Heart disease Father   . Lung cancer Sister   . Lung cancer Brother   . Healthy Daughter   . Healthy Daughter   . Healthy Daughter    Past Surgical History:  Procedure Laterality Date  . CERVICAL DISC SURGERY     x 3  . CONVERSION TO TOTAL KNEE Left 10/09/2016   Procedure: Conversion left uni compartment arthroplasty to total knee arthroplasty;  Surgeon: Paralee Cancel, MD;  Location: [facility];  Service: Orthopedics;  Laterality: Left;  90 mins  . KNEE ARTHROSCOPY     partial  . total left knee arthoplasty     10/09/16 Dr. Alvan Dame   Social History   Social History Narrative  . Not on file   Immunization History  Administered Date(s) Administered  . Influenza Whole 11/12/2012  .  Influenza,inj,Quad PF,6+ Mos 12/11/2017  . Influenza-Unspecified 12/15/2013, 12/12/2015, 11/29/2016  . Td 11/03/2002  . Tdap 09/23/2015  . Zoster 08/12/2012  . Zoster Recombinat (Shingrix) 03/22/2018     Objective: Vital Signs: BP 134/78 (BP Location: Right Arm, Patient Position: Sitting, Cuff Size: Large)   Pulse 92   Resp 14   Ht 5' 6.25" (1.683 m)   Wt 208 lb (94.3 kg)   BMI 33.32 kg/m    Physical Exam Vitals signs and nursing note reviewed.  Constitutional:      Appearance: She is well-developed.  HENT:     Head: Normocephalic and atraumatic.  Eyes:     Conjunctiva/sclera: Conjunctivae normal.  Neck:     Musculoskeletal: Normal range of motion.  Cardiovascular:     Rate and Rhythm: Normal rate and regular rhythm.     Heart sounds: Normal heart sounds.  Pulmonary:     Effort: Pulmonary effort is normal.  Breath sounds: Normal breath sounds.  Abdominal:     General: Bowel sounds are normal.     Palpations: Abdomen is soft.  Lymphadenopathy:     Cervical: No cervical adenopathy.  Skin:    General: Skin is warm and dry.     Capillary Refill: Capillary refill takes less than 2 seconds.  Neurological:     Mental Status: She is alert and oriented to person, place, and time.  Psychiatric:        Behavior: Behavior normal.      Musculoskeletal Exam: C-spine good range of motion.  She has good range of motion lumbar spine with some discomfort.  Shoulder joints with good range of motion.  She has discomfort in her left shoulder joint.  Elbow joints are in good range of motion.  She had warmth swelling and limited range of motion of her left wrist joint.  There was no swelling noted over MCPs or PIPs.  Her left knee joint has been replaced which is warm to touch.  Right knee joint had with some warmth as well.  No effusion was noted.  She has some tenderness across her MTPs but no synovitis was noted.  CDAI Exam: CDAI Score: Not documented Patient Global Assessment: Not  documented; Provider Global Assessment: Not documented Swollen: Not documented; Tender: Not documented Joint Exam   Not documented   There is currently no information documented on the homunculus. Go to the Rheumatology activity and complete the homunculus joint exam.  Investigation: Findings:  03/14/18: ANA 1:160 Cytoplasmic, uric acid 53., RF 116, sed rate 22  Component     Latest Ref Rng & Units 03/14/2018  ANA Titer 1     titer 1:160 (H)  ANA Pattern 1      Cytoplasmic (A)  Uric Acid, Serum     2.5 - 7.0 mg/dL 5.3  RA Latex Turbid.     <14 IU/mL 116 (H)  Anti Nuclear Antibody(ANA)     NEGATIVE POSITIVE (A)  Sed Rate     0 - 30 mm/h 22   Imaging: Xr Foot 2 Views Left  Result Date: 04/26/2018 First MTP, all PIP and DIPs joint space narrowing was noted.  Dorsal spurring was noted.  Inferior and posterior calcaneal spurs were noted.  Tibiotalar joint space narrowing was noted.  Juxta-articular osteopenia was noted. 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  Xr Foot 2 Views Right  Result Date: 04/26/2018 First MTP, all PIP and DIP joint space narrowing was noted.  Juxta-articular osteopenia was noted.  Possible erosion was noted at the base of fifth metatarsal.  Dorsal spurring posterior calcaneal and inferior calcaneal spurring was noted.  Tibiotalar joint space narrowing was noted. 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  Xr Hand 2 View Left  Result Date: 04/26/2018 Juxta-articular osteopenia was noted.  Metacarpocarpal, intercarpal, and radiocarpal joint space narrowing was noted.  Erosive changes were noted in the base of metacarpals carpals and ulnar styloid. 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  Xr Hand 2 View Right  Result Date: 04/26/2018 Juxta-articular osteopenia was noted.  No MCP, intercarpal radiocarpal joint space  narrowing was noted.  Some cystic changes were noted in the carpal bones. Impression: These findings are consistent with inflammatory arthritis.  Xr Knee 3 View Right  Result Date: 04/05/2018 X-rays of the right knee show bone-on-bone medial patellofemoral compartments with periarticular   Recent Labs: Lab Results  Component Value  Date   WBC 7.4 08/22/2017   HGB 10.9 (L) 08/22/2017   PLT 251.0 08/22/2017   NA 140 08/22/2017   K 3.5 08/22/2017   CL 104 08/22/2017   CO2 30 08/22/2017   GLUCOSE 102 (H) 08/22/2017   BUN 16 08/22/2017   CREATININE 0.73 08/22/2017   BILITOT 0.4 08/22/2017   ALKPHOS 68 08/22/2017   AST 13 08/22/2017   ALT 11 08/22/2017   PROT 7.3 08/22/2017   ALBUMIN 3.8 08/22/2017   CALCIUM 9.6 08/22/2017   GFRAA >60 10/11/2016    Speciality Comments: No specialty comments available.  Procedures:  No procedures performed Allergies: Patient has no known allergies.   Assessment / Plan:     Visit Diagnoses: Rheumatoid arthritis involving multiple sites with positive rheumatoid factor ([place]) - 03/14/18: ANA 1:160 Cytoplasmic, uric acid 53., RF 116, sed rate 22 -patient has significant synovitis in her left wrist joint and decreased range of motion.  The x-ray findings are consistent with erosive rheumatoid arthritis.  Detailed counseling on rheumatoid arthritis was provided.  I will obtain labs today.  Plan to is to start her on immunosuppressive agents once the labs are available at follow-up visit.  I offered prednisone taper but she declined.  Plan: Urinalysis, Routine w reflex microscopic  High risk medication use - Plan: CBC with Differential/Platelet, COMPLETE METABOLIC PANEL WITH GFR  Pain in both hands - Plan: XR Hand 2 View Right, XR Hand 2 View Left, x-ray showed juxta-articular osteopenia and erosive changes in the left wrist joint.  Uric acid, Cyclic citrul peptide antibody, IgG, RNP Antibody, Anti-Smith antibody, Sjogrens syndrome-A extractable nuclear  antibody, Sjogrens syndrome-B extractable nuclear antibody, Anti-DNA antibody, double-stranded, C3 and C4  Pain in both feet - Plan: XR Foot 2 Views Right, XR Foot 2 Views Left.  The x-ray showed juxta-articular osteopenia and possible erosion in the fifth metatarsal base of the left foot.  Primary osteoarthritis of right knee - severe.  I reviewed x-ray of the knee joint which shows severe osteoarthritis.  S/P left TK revision-she is doing well with some warmth in her knee joint.  Traumatic incomplete tear of left rotator cuff, subsequent encounter  Other fatigue - Plan: CK, TSH, Hepatitis B core antibody, IgM, Hepatitis B surface antigen, Hepatitis C antibody, HIV Antibody (routine testing w rflx), QuantiFERON-TB Gold Plus, Serum protein electrophoresis with reflex, IgG, IgA, IgM  DDD (degenerative disc disease), lumbar-she has discomfort off and on.  Other medical problems are listed as follows:  Essential hypertension  Other eczema  History of gastroesophageal reflux (GERD)  History of hyperlipidemia  Anxiety and depression   Orders: Orders Placed This Encounter  Procedures  . XR Hand 2 View Right  . XR Hand 2 View Left  . XR Foot 2 Views Right  . XR Foot 2 Views Left  . CBC with Differential/Platelet  . COMPLETE METABOLIC PANEL WITH GFR  . Urinalysis, Routine w reflex microscopic  . CK  . TSH  . Uric acid  . Cyclic citrul peptide antibody, IgG  . RNP Antibody  . Anti-Smith antibody  . Sjogrens syndrome-A extractable nuclear antibody  . Sjogrens syndrome-B extractable nuclear antibody  . Anti-DNA antibody, double-stranded  . C3 and C4  . Hepatitis B core antibody, IgM  . Hepatitis B surface antigen  . Hepatitis C antibody  . HIV Antibody (routine testing w rflx)  . QuantiFERON-TB Gold Plus  . Serum protein electrophoresis with reflex  . IgG, IgA, IgM   No orders of  the defined types were placed in this encounter.   Face-to-face time spent with patient was  45 minutes. Greater than 50% of time was spent in counseling and coordination of care.  Follow-Up Instructions: Return for Osteoarthritis, Rheumatoid arthritis.   Bo Merino, MD  Note - This record has been created using Editor, commissioning.  Chart creation errors have been sought, but may not always  have been located. Such creation errors do not reflect on  the standard of medical care.

## 2018-04-23 ENCOUNTER — Ambulatory Visit: Payer: Medicare HMO | Admitting: Family

## 2018-04-24 ENCOUNTER — Other Ambulatory Visit: Payer: Self-pay | Admitting: Internal Medicine

## 2018-04-25 NOTE — Telephone Encounter (Signed)
Done erx 

## 2018-04-26 ENCOUNTER — Ambulatory Visit (INDEPENDENT_AMBULATORY_CARE_PROVIDER_SITE_OTHER): Payer: Self-pay

## 2018-04-26 ENCOUNTER — Ambulatory Visit (INDEPENDENT_AMBULATORY_CARE_PROVIDER_SITE_OTHER): Payer: Medicare HMO

## 2018-04-26 ENCOUNTER — Ambulatory Visit (INDEPENDENT_AMBULATORY_CARE_PROVIDER_SITE_OTHER): Payer: Medicare HMO | Admitting: Rheumatology

## 2018-04-26 ENCOUNTER — Encounter: Payer: Self-pay | Admitting: Rheumatology

## 2018-04-26 VITALS — BP 134/78 | HR 92 | Resp 14 | Ht 66.25 in | Wt 208.0 lb

## 2018-04-26 DIAGNOSIS — M1711 Unilateral primary osteoarthritis, right knee: Secondary | ICD-10-CM

## 2018-04-26 DIAGNOSIS — M79671 Pain in right foot: Secondary | ICD-10-CM

## 2018-04-26 DIAGNOSIS — R5383 Other fatigue: Secondary | ICD-10-CM | POA: Diagnosis not present

## 2018-04-26 DIAGNOSIS — M79672 Pain in left foot: Secondary | ICD-10-CM | POA: Diagnosis not present

## 2018-04-26 DIAGNOSIS — Z8719 Personal history of other diseases of the digestive system: Secondary | ICD-10-CM

## 2018-04-26 DIAGNOSIS — M5136 Other intervertebral disc degeneration, lumbar region: Secondary | ICD-10-CM | POA: Diagnosis not present

## 2018-04-26 DIAGNOSIS — M79642 Pain in left hand: Secondary | ICD-10-CM | POA: Diagnosis not present

## 2018-04-26 DIAGNOSIS — Z79899 Other long term (current) drug therapy: Secondary | ICD-10-CM

## 2018-04-26 DIAGNOSIS — F32A Depression, unspecified: Secondary | ICD-10-CM

## 2018-04-26 DIAGNOSIS — M19032 Primary osteoarthritis, left wrist: Secondary | ICD-10-CM | POA: Diagnosis not present

## 2018-04-26 DIAGNOSIS — F419 Anxiety disorder, unspecified: Secondary | ICD-10-CM

## 2018-04-26 DIAGNOSIS — M79641 Pain in right hand: Secondary | ICD-10-CM

## 2018-04-26 DIAGNOSIS — S46012D Strain of muscle(s) and tendon(s) of the rotator cuff of left shoulder, subsequent encounter: Secondary | ICD-10-CM | POA: Diagnosis not present

## 2018-04-26 DIAGNOSIS — M0579 Rheumatoid arthritis with rheumatoid factor of multiple sites without organ or systems involvement: Secondary | ICD-10-CM | POA: Diagnosis not present

## 2018-04-26 DIAGNOSIS — I10 Essential (primary) hypertension: Secondary | ICD-10-CM

## 2018-04-26 DIAGNOSIS — F329 Major depressive disorder, single episode, unspecified: Secondary | ICD-10-CM

## 2018-04-26 DIAGNOSIS — Z96652 Presence of left artificial knee joint: Secondary | ICD-10-CM

## 2018-04-26 DIAGNOSIS — Z8639 Personal history of other endocrine, nutritional and metabolic disease: Secondary | ICD-10-CM

## 2018-04-26 DIAGNOSIS — L308 Other specified dermatitis: Secondary | ICD-10-CM

## 2018-04-30 LAB — HEPATITIS C ANTIBODY
Hepatitis C Ab: NONREACTIVE
SIGNAL TO CUT-OFF: 0.01 (ref <1.00)

## 2018-04-30 LAB — CBC WITH DIFFERENTIAL/PLATELET
Absolute Monocytes: 678 cells/uL (ref 200–950)
Basophils Absolute: 39 cells/uL (ref 0–200)
Basophils Relative: 0.7 %
Eosinophils Absolute: 157 cells/uL (ref 15–500)
Eosinophils Relative: 2.8 %
HCT: 36.1 % (ref 35.0–45.0)
Hemoglobin: 12.2 g/dL (ref 11.7–15.5)
Lymphs Abs: 935 cells/uL (ref 850–3900)
MCH: 27.1 pg (ref 27.0–33.0)
MCHC: 33.8 g/dL (ref 32.0–36.0)
MCV: 80 fL (ref 80.0–100.0)
MONOS PCT: 12.1 %
MPV: 10.9 fL (ref 7.5–12.5)
Neutro Abs: 3791 cells/uL (ref 1500–7800)
Neutrophils Relative %: 67.7 %
Platelets: 305 10*3/uL (ref 140–400)
RBC: 4.51 10*6/uL (ref 3.80–5.10)
RDW: 12.3 % (ref 11.0–15.0)
Total Lymphocyte: 16.7 %
WBC: 5.6 10*3/uL (ref 3.8–10.8)

## 2018-04-30 LAB — PROTEIN ELECTROPHORESIS, SERUM, WITH REFLEX
Abnormal Protein Band1: 0.9 g/dL — ABNORMAL HIGH
Albumin ELP: 3.8 g/dL (ref 3.8–4.8)
Alpha 1: 0.4 g/dL — ABNORMAL HIGH (ref 0.2–0.3)
Alpha 2: 0.7 g/dL (ref 0.5–0.9)
BETA 2: 0.5 g/dL (ref 0.2–0.5)
BETA GLOBULIN: 0.5 g/dL (ref 0.4–0.6)
Gamma Globulin: 1.5 g/dL (ref 0.8–1.7)
Total Protein: 7.4 g/dL (ref 6.1–8.1)

## 2018-04-30 LAB — RNP ANTIBODY: Ribonucleic Protein(ENA) Antibody, IgG: 4.1 AI — AB

## 2018-04-30 LAB — URINALYSIS, ROUTINE W REFLEX MICROSCOPIC
Bacteria, UA: NONE SEEN /HPF
Bilirubin Urine: NEGATIVE
Glucose, UA: NEGATIVE
Hgb urine dipstick: NEGATIVE
Hyaline Cast: NONE SEEN /LPF
KETONES UR: NEGATIVE
NITRITE: NEGATIVE
RBC / HPF: NONE SEEN /HPF (ref 0–2)
Specific Gravity, Urine: 1.021 (ref 1.001–1.03)
pH: 5.5 (ref 5.0–8.0)

## 2018-04-30 LAB — COMPLETE METABOLIC PANEL WITH GFR
AG Ratio: 1.2 (calc) (ref 1.0–2.5)
ALKALINE PHOSPHATASE (APISO): 60 U/L (ref 37–153)
ALT: 14 U/L (ref 6–29)
AST: 14 U/L (ref 10–35)
Albumin: 4.1 g/dL (ref 3.6–5.1)
BUN: 10 mg/dL (ref 7–25)
CO2: 30 mmol/L (ref 20–32)
Calcium: 9.6 mg/dL (ref 8.6–10.4)
Chloride: 104 mmol/L (ref 98–110)
Creat: 0.88 mg/dL (ref 0.50–0.99)
GFR, Est African American: 81 mL/min/{1.73_m2} (ref >=60)
GFR, Est Non African American: 70 mL/min/{1.73_m2} (ref >=60)
Globulin: 3.3 g/dL (calc) (ref 1.9–3.7)
Glucose, Bld: 105 mg/dL — ABNORMAL HIGH (ref 65–99)
Potassium: 3.4 mmol/L — ABNORMAL LOW (ref 3.5–5.3)
Sodium: 144 mmol/L (ref 135–146)
Total Bilirubin: 0.4 mg/dL (ref 0.2–1.2)
Total Protein: 7.4 g/dL (ref 6.1–8.1)

## 2018-04-30 LAB — IGG, IGA, IGM
IGG (IMMUNOGLOBIN G), SERUM: 1552 mg/dL — AB (ref 600–1540)
IgM, Serum: 92 mg/dL (ref 50–300)
Immunoglobulin A: 406 mg/dL — ABNORMAL HIGH (ref 70–320)

## 2018-04-30 LAB — CK: Total CK: 95 U/L (ref 29–143)

## 2018-04-30 LAB — QUANTIFERON-TB GOLD PLUS
Mitogen-NIL: 6.32 IU/mL
NIL: 0.01 IU/mL
QUANTIFERON-TB GOLD PLUS: NEGATIVE
TB1-NIL: 0 IU/mL
TB2-NIL: 0 IU/mL

## 2018-04-30 LAB — HIV ANTIBODY (ROUTINE TESTING W REFLEX): HIV: NONREACTIVE

## 2018-04-30 LAB — C3 AND C4
C3 Complement: 132 mg/dL (ref 83–193)
C4 Complement: 27 mg/dL (ref 15–57)

## 2018-04-30 LAB — SJOGRENS SYNDROME-A EXTRACTABLE NUCLEAR ANTIBODY: SSA (RO) (ENA) ANTIBODY, IGG: POSITIVE AI — AB

## 2018-04-30 LAB — HEPATITIS B CORE ANTIBODY, IGM: Hep B C IgM: NONREACTIVE

## 2018-04-30 LAB — ANTI-SMITH ANTIBODY: ENA SM Ab Ser-aCnc: <1 AI

## 2018-04-30 LAB — CYCLIC CITRUL PEPTIDE ANTIBODY, IGG: Cyclic Citrullin Peptide Ab: >250 UNITS — ABNORMAL HIGH

## 2018-04-30 LAB — ANTI-DNA ANTIBODY, DOUBLE-STRANDED: ds DNA Ab: <1 IU/mL

## 2018-04-30 LAB — IFE INTERPRETATION: Immunofix Electr Int: DETECTED

## 2018-04-30 LAB — HEPATITIS B SURFACE ANTIGEN: Hepatitis B Surface Ag: NONREACTIVE

## 2018-04-30 LAB — TSH: TSH: 0.98 mIU/L (ref 0.40–4.50)

## 2018-04-30 LAB — URIC ACID: URIC ACID, SERUM: 5 mg/dL (ref 2.5–7.0)

## 2018-04-30 LAB — SJOGRENS SYNDROME-B EXTRACTABLE NUCLEAR ANTIBODY: SSB (La) (ENA) Antibody, IgG: <1 AI

## 2018-04-30 NOTE — Progress Notes (Signed)
Will discuss labs at the follow-up visit.  Patient has abnormal IFE.  Please refer her to hematology.

## 2018-05-01 ENCOUNTER — Other Ambulatory Visit: Payer: Self-pay

## 2018-05-01 ENCOUNTER — Telehealth: Payer: Self-pay | Admitting: Rheumatology

## 2018-05-01 DIAGNOSIS — R899 Unspecified abnormal finding in specimens from other organs, systems and tissues: Secondary | ICD-10-CM

## 2018-05-01 NOTE — Telephone Encounter (Signed)
Returned patient's call and explained the IFE was abnormal. Patient verbalized understanding and is aware that hematology will be calling to schedule appointment.

## 2018-05-01 NOTE — Telephone Encounter (Signed)
Patient called back requesting a return call.  Patient couldn't remember which lab test you told her was elevated.

## 2018-05-03 DIAGNOSIS — M5136 Other intervertebral disc degeneration, lumbar region: Secondary | ICD-10-CM | POA: Insufficient documentation

## 2018-05-03 DIAGNOSIS — Z79899 Other long term (current) drug therapy: Secondary | ICD-10-CM | POA: Insufficient documentation

## 2018-05-03 DIAGNOSIS — M0579 Rheumatoid arthritis with rheumatoid factor of multiple sites without organ or systems involvement: Secondary | ICD-10-CM | POA: Insufficient documentation

## 2018-05-03 DIAGNOSIS — Z96652 Presence of left artificial knee joint: Secondary | ICD-10-CM | POA: Insufficient documentation

## 2018-05-03 NOTE — Progress Notes (Deleted)
Office Visit Note  Patient: Pamela [name]             Date of Birth: [birth year]           MRN: [id number]             PCP: Biagio Borg, MD Referring: Biagio Borg, MD Visit Date: 05/15/2018 Occupation: @GUAROCC @  Subjective:  No chief complaint on file.   History of Present Illness: Pamela [name] is a 63 y.o. female ***   Activities of Daily Living:  Patient reports morning stiffness for *** {minute/hour:19697}.   Patient {ACTIONS;DENIES/REPORTS:21021675::"Denies"} nocturnal pain.  Difficulty dressing/grooming: {ACTIONS;DENIES/REPORTS:21021675::"Denies"} Difficulty climbing stairs: {ACTIONS;DENIES/REPORTS:21021675::"Denies"} Difficulty getting out of chair: {ACTIONS;DENIES/REPORTS:21021675::"Denies"} Difficulty using hands for taps, buttons, cutlery, and/or writing: {ACTIONS;DENIES/REPORTS:21021675::"Denies"}  No Rheumatology ROS completed.   PMFS History:  Patient Active Problem List   Diagnosis Date Noted  . Primary osteoarthritis of right knee 04/05/2018  . 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 Left rotator cuff tear 12/19/2017  . Left wrist pain 12/12/2017  . Left shoulder pain 08/22/2017  . Chronic pain of right knee 08/22/2017  . Acute pain of right knee 04/17/2017  . Pain of left calf 04/13/2017  . 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 Hyperglycemia 09/23/2015  . Anxiety state 09/23/2015  . Abnormal urine odor 08/25/2015  . GERD (gastroesophageal reflux disease) 08/25/2015  . Upper abdominal pain 04/16/2015  . Early satiety 04/16/2015  . Dysuria 04/16/2015  . Encounter for well adult exam with abnormal findings 06/18/2014  . Greater trochanteric bursitis of right hip 01/06/2013  . Degenerative arthritis of left knee 09/10/2012  . Left lumbar radiculitis 04/03/2012  . Eczema 04/03/2012  . 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 BURSITIS, RIGHT HIP 08/25/2009  . INSOMNIA-SLEEP DISORDER-UNSPEC 08/25/2009  . FATIGUE 08/25/2009  . Cervicalgia 08/14/2007  . Depression  03/15/2007  . HLD (hyperlipidemia) 12/18/2006  . Essential hypertension 12/18/2006  . ALLERGIC RHINITIS 12/18/2006  . RENAL CALCULUS, HX OF 12/18/2006    Past Medical History:  Diagnosis Date  . ALLERGIC RHINITIS 12/18/2006   Qualifier: Diagnosis of  By: [place], [place]    . Anxiety   . Arthritis   . DEPRESSION, SITUATIONAL 03/15/2007   Qualifier: Diagnosis of  By: Wynona Luna   . GERD (gastroesophageal reflux disease)   . Headache    occasional  . History of kidney stones   . HYPERLIPIDEMIA 12/18/2006   Qualifier: History of  By: Danny Lawless CMA, [place]    . Hypertension   . HYPERTENSION 12/18/2006   Qualifier: Diagnosis of  By: Danny Lawless CMA, [place]    . INSOMNIA-SLEEP DISORDER-UNSPEC 08/25/2009   Qualifier: Diagnosis of  By: Jenny Reichmann MD, Hunt Oris   . Pain in knee region after total knee replacement ([facility]) 04/03/2012  . Pneumonia   . RENAL CALCULUS, HX OF 12/18/2006   Qualifier: Diagnosis of  By: Danny Lawless CMA, [place]      Family History  Problem Relation Age of Onset  . Stroke Mother   . Heart disease Father   . Lung cancer Sister   . Lung cancer Brother   . Healthy Daughter   . Healthy Daughter   . Healthy Daughter    Past Surgical History:  Procedure Laterality Date  . CERVICAL DISC SURGERY     x 3  . CONVERSION TO TOTAL KNEE Left 10/09/2016   Procedure: Conversion left uni compartment arthroplasty to total knee arthroplasty;  Surgeon: Paralee Cancel, MD;  Location: [facility];  Service:  Orthopedics;  Laterality: Left;  90 mins  . KNEE ARTHROSCOPY     partial  . total left knee arthoplasty     10/09/16 Dr. Alvan Dame   Social History   Social History Narrative  . Not on file   Immunization History  Administered Date(s) Administered  . Influenza Whole 11/12/2012  . Influenza,inj,Quad PF,6+ Mos 12/11/2017  . Influenza-Unspecified 12/15/2013, 12/12/2015, 11/29/2016  . Td 11/03/2002  . Tdap 09/23/2015  . Zoster 08/12/2012  . Zoster Recombinat (Shingrix) 03/22/2018      Objective: Vital Signs: There were no vitals taken for this visit.   Physical Exam   Musculoskeletal Exam: ***  CDAI Exam: CDAI Score: Not documented Patient Global Assessment: Not documented; Provider Global Assessment: Not documented Swollen: Not documented; Tender: Not documented Joint Exam   Not documented   There is currently no information documented on the homunculus. Go to the Rheumatology activity and complete the homunculus joint exam.  Investigation: No additional findings.  Imaging: Xr Foot 2 Views Left  Result Date: 04/26/2018 First MTP, all PIP and DIPs joint space narrowing was noted.  Dorsal spurring was noted.  Inferior and posterior calcaneal spurs were noted.  Tibiotalar joint space narrowing was noted.  Juxta-articular osteopenia was noted. 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  Xr Foot 2 Views Right  Result Date: 04/26/2018 First MTP, all PIP and DIP joint space narrowing was noted.  Juxta-articular osteopenia was noted.  Possible erosion was noted at the base of fifth metatarsal.  Dorsal spurring posterior calcaneal and inferior calcaneal spurring was noted.  Tibiotalar joint space narrowing was noted. 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  Xr Hand 2 View Left  Result Date: 04/26/2018 Juxta-articular osteopenia was noted.  Metacarpocarpal, intercarpal, and radiocarpal joint space narrowing was noted.  Erosive changes were noted in the base of metacarpals carpals and ulnar styloid. 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  Xr Hand 2 View Right  Result Date: 04/26/2018 Juxta-articular osteopenia was noted.  No MCP, intercarpal radiocarpal joint space narrowing was noted.  Some cystic changes were noted in the carpal bones. Impression: These findings are consistent with inflammatory arthritis.  Xr Knee 3  View Right  Result Date: 04/05/2018 X-rays of the right knee show bone-on-bone medial patellofemoral compartments with periarticular   Recent Labs: Lab Results  Component Value Date   WBC 5.6 04/26/2018   HGB 12.2 04/26/2018   PLT 305 04/26/2018   NA 144 04/26/2018   K 3.4 (L) 04/26/2018   CL 104 04/26/2018   CO2 30 04/26/2018   GLUCOSE 105 (H) 04/26/2018   BUN 10 04/26/2018   CREATININE 0.88 04/26/2018   BILITOT 0.4 04/26/2018   ALKPHOS 68 08/22/2017   AST 14 04/26/2018   ALT 14 04/26/2018   PROT 7.4 04/26/2018   PROT 7.4 04/26/2018   ALBUMIN 3.8 08/22/2017   CALCIUM 9.6 04/26/2018   GFRAA 81 04/26/2018   QFTBGOLDPLUS NEGATIVE 04/26/2018   IFE IgG lambda monoclonal protein, HIV negative, hepatitis B negative, hepatitis C-, immunoglobulins IgG elevated, TB Gold negative, CK normal, TSH normal, uric acid 5.0, anti-CCP> 250, Smith negative, SSB negative, dsDNA negative, SSA positive, UA showed 2+ protein Speciality Comments: No specialty comments available.  Procedures:  No procedures performed Allergies: Patient has no known allergies.   Assessment / Plan:     Visit Diagnoses: No diagnosis found.   Orders: No orders of the defined types were placed in  this encounter.  No orders of the defined types were placed in this encounter.   Face-to-face time spent with patient was *** minutes. Greater than 50% of time was spent in counseling and coordination of care.  Follow-Up Instructions: No follow-ups on file.   Bo Merino, MD  Note - This record has been created using Editor, commissioning.  Chart creation errors have been sought, but may not always  have been located. Such creation errors do not reflect on  the standard of medical care.

## 2018-05-04 ENCOUNTER — Other Ambulatory Visit: Payer: Self-pay | Admitting: Internal Medicine

## 2018-05-07 ENCOUNTER — Telehealth: Payer: Self-pay | Admitting: Internal Medicine

## 2018-05-07 ENCOUNTER — Encounter: Payer: Self-pay | Admitting: Hematology

## 2018-05-07 NOTE — Telephone Encounter (Signed)
Pt wanted to reschedule appt to an earlier appt date and time. She has been rescheduled to see Dr. Julien Nordmann on 2/29 at 845am. Pt aware to arrive 30 minutes early for labs and to be checked in on time.

## 2018-05-08 ENCOUNTER — Telehealth: Payer: Self-pay | Admitting: Internal Medicine

## 2018-05-08 NOTE — Telephone Encounter (Signed)
Added lab to 2/29 new patient visit. Left message for patient.

## 2018-05-10 ENCOUNTER — Telehealth: Payer: Self-pay | Admitting: Internal Medicine

## 2018-05-10 ENCOUNTER — Other Ambulatory Visit: Payer: Self-pay | Admitting: Internal Medicine

## 2018-05-10 DIAGNOSIS — D472 Monoclonal gammopathy: Secondary | ICD-10-CM

## 2018-05-10 NOTE — Telephone Encounter (Signed)
Left message confirming 2/29 appointments. See previous notes.

## 2018-05-11 ENCOUNTER — Inpatient Hospital Stay: Payer: Medicare HMO | Attending: Hematology | Admitting: Internal Medicine

## 2018-05-11 ENCOUNTER — Inpatient Hospital Stay: Payer: Medicare HMO

## 2018-05-11 ENCOUNTER — Encounter: Payer: Self-pay | Admitting: Internal Medicine

## 2018-05-11 ENCOUNTER — Telehealth: Payer: Self-pay | Admitting: Internal Medicine

## 2018-05-11 VITALS — BP 153/91 | HR 98 | Temp 98.4°F | Resp 18 | Ht 66.25 in | Wt 218.2 lb

## 2018-05-11 DIAGNOSIS — I10 Essential (primary) hypertension: Secondary | ICD-10-CM | POA: Diagnosis not present

## 2018-05-11 DIAGNOSIS — Z801 Family history of malignant neoplasm of trachea, bronchus and lung: Secondary | ICD-10-CM | POA: Diagnosis not present

## 2018-05-11 DIAGNOSIS — M25569 Pain in unspecified knee: Secondary | ICD-10-CM | POA: Insufficient documentation

## 2018-05-11 DIAGNOSIS — G4700 Insomnia, unspecified: Secondary | ICD-10-CM | POA: Insufficient documentation

## 2018-05-11 DIAGNOSIS — M25532 Pain in left wrist: Secondary | ICD-10-CM

## 2018-05-11 DIAGNOSIS — M129 Arthropathy, unspecified: Secondary | ICD-10-CM | POA: Insufficient documentation

## 2018-05-11 DIAGNOSIS — Z87891 Personal history of nicotine dependence: Secondary | ICD-10-CM | POA: Diagnosis not present

## 2018-05-11 DIAGNOSIS — D472 Monoclonal gammopathy: Secondary | ICD-10-CM | POA: Insufficient documentation

## 2018-05-11 DIAGNOSIS — Z170 Estrogen receptor positive status [ER+]: Secondary | ICD-10-CM | POA: Diagnosis not present

## 2018-05-11 DIAGNOSIS — Z79811 Long term (current) use of aromatase inhibitors: Secondary | ICD-10-CM | POA: Diagnosis not present

## 2018-05-11 DIAGNOSIS — F329 Major depressive disorder, single episode, unspecified: Secondary | ICD-10-CM | POA: Insufficient documentation

## 2018-05-11 DIAGNOSIS — Z791 Long term (current) use of non-steroidal anti-inflammatories (NSAID): Secondary | ICD-10-CM | POA: Diagnosis not present

## 2018-05-11 DIAGNOSIS — C50911 Malignant neoplasm of unspecified site of right female breast: Secondary | ICD-10-CM | POA: Diagnosis not present

## 2018-05-11 DIAGNOSIS — Z7982 Long term (current) use of aspirin: Secondary | ICD-10-CM | POA: Diagnosis not present

## 2018-05-11 DIAGNOSIS — E785 Hyperlipidemia, unspecified: Secondary | ICD-10-CM | POA: Insufficient documentation

## 2018-05-11 DIAGNOSIS — Z806 Family history of leukemia: Secondary | ICD-10-CM | POA: Diagnosis not present

## 2018-05-11 DIAGNOSIS — Z87442 Personal history of urinary calculi: Secondary | ICD-10-CM | POA: Insufficient documentation

## 2018-05-11 DIAGNOSIS — M85861 Other specified disorders of bone density and structure, right lower leg: Secondary | ICD-10-CM | POA: Diagnosis not present

## 2018-05-11 DIAGNOSIS — D649 Anemia, unspecified: Secondary | ICD-10-CM | POA: Insufficient documentation

## 2018-05-11 DIAGNOSIS — Z79899 Other long term (current) drug therapy: Secondary | ICD-10-CM | POA: Diagnosis not present

## 2018-05-11 LAB — CBC WITH DIFFERENTIAL (CANCER CENTER ONLY)
Abs Immature Granulocytes: 0.01 10*3/uL (ref 0.00–0.07)
Basophils Absolute: 0.1 10*3/uL (ref 0.0–0.1)
Basophils Relative: 1 %
Eosinophils Absolute: 0.3 10*3/uL (ref 0.0–0.5)
Eosinophils Relative: 5 %
HCT: 33.9 % — ABNORMAL LOW (ref 36.0–46.0)
Hemoglobin: 11.2 g/dL — ABNORMAL LOW (ref 12.0–15.0)
IMMATURE GRANULOCYTES: 0 %
Lymphocytes Relative: 24 %
Lymphs Abs: 1.5 10*3/uL (ref 0.7–4.0)
MCH: 26.8 pg (ref 26.0–34.0)
MCHC: 33 g/dL (ref 30.0–36.0)
MCV: 81.1 fL (ref 80.0–100.0)
Monocytes Absolute: 0.8 10*3/uL (ref 0.1–1.0)
Monocytes Relative: 12 %
NEUTROS PCT: 58 %
Neutro Abs: 3.7 10*3/uL (ref 1.7–7.7)
PLATELETS: 251 10*3/uL (ref 150–400)
RBC: 4.18 MIL/uL (ref 3.87–5.11)
RDW: 12.7 % (ref 11.5–15.5)
WBC: 6.4 10*3/uL (ref 4.0–10.5)
nRBC: 0 % (ref 0.0–0.2)

## 2018-05-11 LAB — COMPREHENSIVE METABOLIC PANEL
ALT: 27 U/L (ref 0–44)
AST: 20 U/L (ref 15–41)
Albumin: 4 g/dL (ref 3.5–5.0)
Alkaline Phosphatase: 55 U/L (ref 38–126)
Anion gap: 8 (ref 5–15)
BUN: 18 mg/dL (ref 8–23)
CO2: 26 mmol/L (ref 22–32)
Calcium: 9.1 mg/dL (ref 8.9–10.3)
Chloride: 104 mmol/L (ref 98–111)
Creatinine, Ser: 0.69 mg/dL (ref 0.44–1.00)
GFR calc Af Amer: >60 mL/min (ref >60)
GFR calc non Af Amer: >60 mL/min (ref >60)
Glucose, Bld: 92 mg/dL (ref 70–99)
POTASSIUM: 3.6 mmol/L (ref 3.5–5.1)
SODIUM: 138 mmol/L (ref 135–145)
Total Bilirubin: 0.6 mg/dL (ref 0.3–1.2)
Total Protein: 7.7 g/dL (ref 6.5–8.1)

## 2018-05-11 LAB — LACTATE DEHYDROGENASE: LDH: 155 U/L (ref 98–192)

## 2018-05-11 NOTE — Progress Notes (Signed)
[place] [phone]   [phone]  CONSULT NOTE  REFERRING PHYSICIAN: Dr. 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  HPI Pamela [name] is a 63 y.o. female with past medical history significant for allergic rhinitis, hypertension, dyslipidemia, kidney stone, cervical disc disease status post surgical intervention.  The patient has a recent fall on her left wrist and she sustained some pain in that area.  She had x-ray of the left wrist at that time that showed osseous demineralization with the scattered degenerative changes.  There was also cystic changes versus erosions at the ulnar styloid process at the base of the second metacarpal which could reflect an inflammatory arthropathy.  The patient was referred to Dr. Estanislado Pandy for evaluation and management of this condition.  During her evaluation she underwent several studies and lab work including serum protein electrophoresis with quantitative immunoglobulin and immunofixation.  It showed M spike of 0.9.  There was also slightly elevated IgG of 1552 and immune fixation showed IgG lambda monoclonal protein. The patient was referred to me today for evaluation and to rule out underlying monoclonal gammopathy or multiple myeloma.  When seen today she is feeling fine with no concerning complaints except for arthralgia.  She denied having any chest pain, shortness of breath, cough or hemoptysis.  She has no nausea, vomiting, diarrhea or constipation.  She has no headache or visual changes.  She intentionally lost a lot of weight recently.  The patient has no bleeding issues.  She has no history of kidney insufficiency. Family history significant for mother with a stroke father had heart disease, brother and sister had lung cancer and another sister had multiple myeloma. The patient is married and was accompanied by her husband Legrand Como.  She has 3  children.  She is currently on disability and used to work as a Marine scientist at [employer].  She has a history of smoking for around 30 years but quit 18 years ago.  She has no history of alcohol or drug abuse.  HPI  Past Medical History:  Diagnosis Date  . ALLERGIC RHINITIS 12/18/2006   Qualifier: Diagnosis of  By: [place], [place]    . Anxiety   . Arthritis   . DEPRESSION, SITUATIONAL 03/15/2007   Qualifier: Diagnosis of  By: Wynona Luna   . GERD (gastroesophageal reflux disease)   . Headache    occasional  . History of kidney stones   . HYPERLIPIDEMIA 12/18/2006   Qualifier: History of  By: Danny Lawless CMA, [place]    . Hypertension   . HYPERTENSION 12/18/2006   Qualifier: Diagnosis of  By: Danny Lawless CMA, [place]    . INSOMNIA-SLEEP DISORDER-UNSPEC 08/25/2009   Qualifier: Diagnosis of  By: Jenny Reichmann MD, Hunt Oris   . Pain in knee region after total knee replacement ([facility] [facility]) 04/03/2012  . Pneumonia   . RENAL CALCULUS, HX OF 12/18/2006   Qualifier: Diagnosis of  By: [place], [place]      Past Surgical History:  Procedure Laterality Date  . CERVICAL DISC SURGERY     x 3  . CONVERSION TO TOTAL KNEE Left 10/09/2016   Procedure: Conversion left uni compartment arthroplasty to total knee arthroplasty;  Surgeon: Paralee Cancel, MD;  Location: [facility];  Service: Orthopedics;  Laterality: Left;  90 mins  . KNEE ARTHROSCOPY     partial  . total left knee arthoplasty     10/09/16 Dr.  OLIN    Family History  Problem Relation Age of Onset  . Stroke Mother   . Heart disease Father   . Lung cancer Sister   . Lung cancer Brother   . Healthy Daughter   . Healthy Daughter   . Healthy Daughter     Social History Social History   Tobacco Use  . Smoking status: Former Research scientist (life sciences)  . Smokeless tobacco: Never Used  . Tobacco comment: 1970's  Substance Use Topics  . Alcohol use: Yes    Alcohol/week: 0.0 standard drinks    Comment: occ  . Drug use: No    No Known Allergies  Current  Outpatient Medications  Medication Sig Dispense Refill  . ALPRAZolam (XANAX) 0.25 MG tablet TAKE 1 TABLET BY MOUTH TWICE DAILY AS NEEDED 60 tablet 5  . aspirin 81 MG chewable tablet Chew 1 tablet (81 mg total) by mouth 2 (two) times daily. Take for 4 weeks. (Patient not taking: Reported on 04/26/2018) 60 tablet 0  . atorvastatin (LIPITOR) 20 MG tablet TAKE 1 TABLET (20 MG TOTAL) BY MOUTH DAILY. 90 tablet 3  . Camphor-Menthol-Methyl Sal (TIGER BALM MUSCLE RUB EX) Apply 1 application topically daily as needed (pain).    . cholecalciferol (VITAMIN D) 1000 units tablet Take 1,000 Units by mouth daily.    . cyclobenzaprine (FLEXERIL) 5 MG tablet take 1 tablet by mouth three times a day if needed for muscle spasms 30 tablet 0  . diclofenac sodium (VOLTAREN) 1 % GEL Apply 2 g topically 4 (four) times daily. 1 Tube 2  . DULoxetine (CYMBALTA) 60 MG capsule Take 1 capsule (60 mg total) by mouth daily. 90 capsule 3  . gabapentin (NEURONTIN) 300 MG capsule   0  . losartan-hydrochlorothiazide (HYZAAR) 50-12.5 MG tablet TAKE 1 TABLET BY MOUTH DAILY 90 tablet 1  . meloxicam (MOBIC) 15 MG tablet TAKE 1 TABLET(15 MG) BY MOUTH DAILY (Patient not taking: Reported on 04/26/2018) 90 tablet 0  . metoCLOPramide (REGLAN) 10 MG tablet Take 1 tablet (10 mg total) by mouth at bedtime. 30 tablet 0  . metoCLOPramide (REGLAN) 10 MG tablet Take 1 tablet (10 mg total) by mouth 2 (two) times daily. (Patient not taking: Reported on 04/26/2018) 60 tablet 11  . pantoprazole (PROTONIX) 40 MG tablet take 1 tablet by mouth once daily (Patient not taking: Reported on 04/26/2018) 90 tablet 1  . pantoprazole (PROTONIX) 40 MG tablet Take 1 tablet (40 mg total) by mouth daily. 30 tablet 11  . traMADol (ULTRAM) 50 MG tablet take 1 tablet by mouth every 6 hours if needed for pain 120 tablet 2  . zolpidem (AMBIEN) 10 MG tablet TAKE 1 TABLET BY MOUTH AT BEDTIME FOR SLEEP 90 tablet 1   No current facility-administered medications for this visit.       Review of Systems  Constitutional: positive for fatigue Eyes: negative Ears, nose, mouth, throat, and face: negative Respiratory: negative Cardiovascular: negative Gastrointestinal: negative Genitourinary:negative Integument/breast: negative Hematologic/lymphatic: negative Musculoskeletal:positive for arthralgias Neurological: negative Behavioral/Psych: negative Endocrine: negative Allergic/Immunologic: negative  Physical Exam  OQH:UTMLY, healthy, no distress, well nourished and well developed SKIN: skin color, texture, turgor are normal, no rashes or significant lesions HEAD: Normocephalic, No masses, lesions, tenderness or abnormalities EYES: normal, PERRLA, Conjunctiva are pink and non-injected EARS: External ears normal, Canals clear OROPHARYNX:no exudate, no erythema and lips, buccal mucosa, and tongue normal  NECK: supple, no adenopathy, no JVD LYMPH:  no palpable lymphadenopathy, no hepatosplenomegaly BREAST:not examined LUNGS: clear to auscultation ,  and palpation HEART: regular rate & rhythm, no murmurs and no gallops ABDOMEN:abdomen soft, non-tender, normal bowel sounds and no masses or organomegaly BACK: Back symmetric, no curvature., No CVA tenderness EXTREMITIES:no joint deformities, effusion, or inflammation, no edema  NEURO: alert & oriented x 3 with fluent speech, no focal motor/sensory deficits  PERFORMANCE STATUS: ECOG 1  LABORATORY DATA: Lab Results  Component Value Date   WBC 6.4 05/11/2018   HGB 11.2 (L) 05/11/2018   HCT 33.9 (L) 05/11/2018   MCV 81.1 05/11/2018   PLT 251 05/11/2018      Chemistry      Component Value Date/Time   NA 144 04/26/2018 0913   K 3.4 (L) 04/26/2018 0913   CL 104 04/26/2018 0913   CO2 30 04/26/2018 0913   BUN 10 04/26/2018 0913   CREATININE 0.88 04/26/2018 0913      Component Value Date/Time   CALCIUM 9.6 04/26/2018 0913   ALKPHOS 68 08/22/2017 1114   AST 14 04/26/2018 0913   ALT 14 04/26/2018 0913    BILITOT 0.4 04/26/2018 0913       RADIOGRAPHIC STUDIES: Xr Foot 2 Views Left  Result Date: 04/26/2018 First MTP, all PIP and DIPs joint space narrowing was noted.  Dorsal spurring was noted.  Inferior and posterior calcaneal spurs were noted.  Tibiotalar joint space narrowing was noted.  Juxta-articular osteopenia was noted. 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  Xr Foot 2 Views Right  Result Date: 04/26/2018 First MTP, all PIP and DIP joint space narrowing was noted.  Juxta-articular osteopenia was noted.  Possible erosion was noted at the base of fifth metatarsal.  Dorsal spurring posterior calcaneal and inferior calcaneal spurring was noted.  Tibiotalar joint space narrowing was noted. 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  Xr Hand 2 View Left  Result Date: 04/26/2018 Juxta-articular osteopenia was noted.  Metacarpocarpal, intercarpal, and radiocarpal joint space narrowing was noted.  Erosive changes were noted in the base of metacarpals carpals and ulnar styloid. 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  Xr Hand 2 View Right  Result Date: 04/26/2018 Juxta-articular osteopenia was noted.  No MCP, intercarpal radiocarpal joint space narrowing was noted.  Some cystic changes were noted in the carpal bones. Impression: These findings are consistent with inflammatory arthritis.   ASSESSMENT: This is a very pleasant 63 years old African-American female presented with suspicious monoclonal gammopathy/multiple myeloma but this could be also inflammatory in origin.   PLAN: I had a lengthy discussion with the patient and her husband today about her current condition and further investigation to confirm or rule out the diagnosis of monoclonal gammopathy/multiple myeloma. I order several studies today including repeat CBC, comprehensive  metabolic panel, LDH, beta-2 microglobulin, quantitative immunoglobulin, serum light chain. The available lab so far showed mild anemia but no other significant abnormalities in her comprehensive metabolic panel. The myeloma panel is still pending. I will arrange for the patient to come back for follow-up visit and reevaluation in 2 weeks.  If the myeloma panel showed any concerning findings, we may consider the patient for a bone marrow biopsy and aspirate as well as a skeletal bone survey. The patient was advised to call immediately if she has any other concerning symptoms in the interval. The patient voices understanding of current disease status and treatment options and is in agreement with the current care plan.  All questions were answered. The patient knows to call the  clinic with any problems, questions or concerns. We can certainly see the patient much sooner if necessary.  Thank you so much for allowing me to participate in the care of Pamela [name]. I will continue to follow up the patient with you and assist in her care.  I spent 40 minutes counseling the patient face to face. The total time spent in the appointment was 60 minutes.  Disclaimer: This note was dictated with voice recognition software. Similar sounding words can inadvertently be transcribed and may not be corrected upon review.   Eilleen Kempf May 11, 2018, 8:12 AM

## 2018-05-11 NOTE — Patient Instructions (Signed)
Monoclonal Gammopathy of Undetermined Significance (MGUS) Monoclonal gammopathy of undetermined significance (MGUS) is a condition in which there is too much of a protein called monoclonal protein, or M protein, in the blood. MGUS can cause you to have too many cells in your blood and not enough space for healthy cells. This condition may increase your risk of developing multiple myeloma or other blood disorders in the future. What are the causes? The cause of this condition is not known. What increases the risk? You are more likely to develop this condition if:  You are African American.  You are age 20 or older.  You are female.  You have an autoimmune disease.  You have been exposed to radiation.  You have a family history of MGUS. What are the signs or symptoms? There are no symptoms of this condition. How is this diagnosed?  This condition may be diagnosed with a blood test that checks for M protein. How is this treated? Treatment for this condition may involve:  Having regular exams. This will allow your health care provider to monitor your health.  Having tests done regularly, such as: ? Blood tests to check for M protein in your body. ? Imaging tests, such as a CT scan. ? A bone marrow biopsy. This test involves taking a sample of bone marrow from your body so it can be looked at under a microscope. Follow these instructions at home:  Keep all follow-up visits as told by your health care provider. This is important. Contact a health care provider if:  You have trouble swallowing.  You have pain in your back or ribs.  You have a fever.  You are bruising easily. Get help right away if:  You break a bone.  You have trouble breathing. Summary  Monoclonal gammopathy of undetermined significance (MGUS) is a condition in which there is too much of a protein called monoclonal protein, or M protein, in the blood.  This condition may be diagnosed with a blood test  that checks for M protein.  Treatment for this condition may involve having tests done regularly. Tests may include blood tests, imaging tests, and a bone marrow biopsy. This information is not intended to replace advice given to you by your health care provider. Make sure you discuss any questions you have with your health care provider. Document Released: 01/19/2016 Document Revised: 01/19/2016 Document Reviewed: 01/19/2016 Elsevier Interactive Patient Education  2019 [employer]. Multiple Myeloma  Multiple myeloma is a form of cancer. It develops when abnormal plasma cells grow out of control. Plasma cells are a type of white blood cell that is made in the soft tissue inside the bones (bone marrow). They are part of the body's disease-fighting system (immune system). Multiple myeloma damages bones and causes other health problems because of its effect on blood cells. Abnormal plasma cells produce monoclonal proteins (M proteins) and interfere with many important functions that normal cells perform in the body. The disease gets worse over time (progresses) and reduces the body's ability to fight infections. What are the causes? The cause of multiple myeloma is not known. What increases the risk? You are more likely to develop this condition if you:  Are older than age 33.  Are female.  Are African American.  Have a family history of multiple myeloma.  Have a history of monoclonal gammopathy of undetermined significance (MGUS).  Have a history of radiation exposure.  Have been exposed to certain chemicals, such as benzene or pesticides. What  are the signs or symptoms? Signs and symptoms of multiple myeloma may include:  Bone pain, especially in the back, ribs, and hips.  Broken bones (fractures).  Having a low level of red blood cells (anemia), white blood cells (leukopenia), and platelets (thrombocytopenia). Platelets are cells that help blood to clot so a wound does not keep  bleeding.  Fatigue.  Weakness.  Infections.  Unusual bleeding, such as: ? Bleeding from the nose or gums. ? Bleeding a lot from a small scrape or cut.  High blood calcium levels.  Increased urination.  Confusion.  Shortness of breath.  Weakness or numbness in your legs.  Sudden, severe back pain. How is this diagnosed? This condition is diagnosed based on your symptoms, your medical history, and a physical exam. You will have blood and urine tests to confirm that M proteins are present. You may also have other tests, including:  Additional blood tests.  X-rays.  MRI.  CT scan.  PET scan.  Tests to check the function of your kidneys.  Heart tests, such as an echocardiogram. An echocardiogram uses sound waves to produce an image of the heart.  A procedure to remove a sample of bone marrow (bone marrow biopsy). The sample is examined for abnormal plasma cells. How is this treated? There is no cure for multiple myeloma. However, treatments can manage symptoms and slow the progression of the disease. Treatment options may vary depending on how much the disease has advanced. Possible treatment options may include:  Medicines that kill cancer cells (chemotherapy).  Radiation therapy. This is the use of high-energy rays to kill cancer cells.  A bone marrow transplant. This procedure replaces diseased bone marrow with healthy bone marrow (stem cell transplant).  Medicines that block the growth and spread of cancer cells (targeted drug therapy).  Medicines that strengthen your immune system's ability to fight cancer cells (immunotherapy or biologic therapy).  Participating in clinical trials to find out if new (experimental) treatments are effective.  Medicines that help to prevent bone damage (bisphosphonates).  Medicines that reduce swelling (corticosteroids).  Surgery to repair bone damage.  A procedure to remove plasma cells from your blood  (plasmapheresis).  Other medicines to treat problems such as infections or pain. Follow these instructions at home:  Eating and drinking  Drink enough fluid to keep your urine pale yellow.  Try to eat healthy meals on a regular basis. Some of your treatments might affect your appetite. If you are having problems eating or if you do not have an appetite, meet with a diet and nutrition specialist (dietitian).  Take vitamins or supplements only as told by your health care provider or dietitian. Some vitamins and supplements may interfere with how well your treatment works. General instructions  Take over-the-counter and prescription medicines only as told by your health care provider.  Stay active. Talk with your health care provider about what types of exercises and activities are safe for you. ? Avoid activities that cause increased pain. ? Do not lift anything that is heavier than 10 lb (4.5 kg), or the limit that you are told, until your health care provider says that it is safe.  Consider joining a support group or getting counseling to help you cope with the stress of having multiple myeloma.  Keep all follow-up visits as told by your health care provider. This is important. Where to find more information  American Cancer Society: www.cancer.org  Leukemia and Lymphoma Society: www.LLS.[place] www.cancer.gov  Contact a health care provider if you:  Have pain that gets worse or does not get better with medicine.  Have a fever.  Have swollen legs.  Have weakness or dizziness.  Have unexplained weight loss.  Have unexplained bleeding or bruising.  Have a cough or symptoms of the common cold.  Feel depressed.  Have changes in urination or bowel movements. Get help right away if you:  Have sudden severe pain, especially back pain.  Have numbness or weakness in your arms, hands, legs, or feet.  Become very confused.  Have weakness on  one side of your body.  Have slurred speech.  Have trouble staying awake.  Have shortness of breath.  Have blood in your stool (feces) or urine.  Vomit blood or cough up blood. Summary  Multiple myeloma is a form of cancer. It develops when abnormal plasma cells grow out of control.  There is no cure for multiple myeloma. However, treatments can manage symptoms and slow the progression of the disease. Treatment options may vary depending on how much the disease has advanced.  Do not lift anything that is heavier than 10 lb (4.5 kg), or the limit that you are told, until your health care provider says that it is safe.  Contact your health care provider if you have any new symptoms or sudden severe pain, especially back pain. This information is not intended to replace advice given to you by your health care provider. Make sure you discuss any questions you have with your health care provider. Document Released: 11/22/2000 Document Revised: 12/27/2016 Document Reviewed: 12/27/2016 Elsevier Interactive Patient Education  2019 [employer].

## 2018-05-11 NOTE — Telephone Encounter (Signed)
Gave avs and calendar ° °

## 2018-05-12 LAB — BETA 2 MICROGLOBULIN, SERUM: Beta-2 Microglobulin: 1.5 mg/L (ref 0.6–2.4)

## 2018-05-13 LAB — KAPPA/LAMBDA LIGHT CHAINS
KAPPA FREE LGHT CHN: 17.6 mg/L (ref 3.3–19.4)
Kappa, lambda light chain ratio: 0.83 (ref 0.26–1.65)
Lambda free light chains: 21.1 mg/L (ref 5.7–26.3)

## 2018-05-15 ENCOUNTER — Ambulatory Visit: Payer: Medicare HMO | Admitting: Rheumatology

## 2018-05-15 LAB — IMMUNOFIXATION ELECTROPHORESIS
IgA: 367 mg/dL — ABNORMAL HIGH (ref 87–352)
IgG (Immunoglobin G), Serum: 1474 mg/dL (ref 700–1600)
IgM (Immunoglobulin M), Srm: 86 mg/dL (ref 26–217)
Total Protein ELP: 6.7 g/dL (ref 6.0–8.5)

## 2018-05-20 NOTE — Progress Notes (Deleted)
Office Visit Note  Patient: Pamela [name]             Date of Birth: September 26, 1955           MRN: [id number]             PCP: Biagio Borg, MD Referring: Biagio Borg, MD Visit Date: 05/22/2018 Occupation: @GUAROCC @  Subjective:  No chief complaint on file.   History of Present Illness: Pamela [name] is a 63 y.o. female ***   Activities of Daily Living:  Patient reports morning stiffness for *** {minute/hour:19697}.   Patient {ACTIONS;DENIES/REPORTS:21021675::"Denies"} nocturnal pain.  Difficulty dressing/grooming: {ACTIONS;DENIES/REPORTS:21021675::"Denies"} Difficulty climbing stairs: {ACTIONS;DENIES/REPORTS:21021675::"Denies"} Difficulty getting out of chair: {ACTIONS;DENIES/REPORTS:21021675::"Denies"} Difficulty using hands for taps, buttons, cutlery, and/or writing: {ACTIONS;DENIES/REPORTS:21021675::"Denies"}  No Rheumatology ROS completed.   PMFS History:  Patient Active Problem List   Diagnosis Date Noted  . MGUS (monoclonal gammopathy of unknown significance) 05/11/2018  . Rheumatoid arthritis involving multiple sites with positive rheumatoid factor ([place]) 05/03/2018  . High risk medication use 05/03/2018  . Status post total left knee replacement 05/03/2018  . DDD (degenerative disc disease), lumbar 05/03/2018  . Primary osteoarthritis of right knee 04/05/2018  . 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 Left rotator cuff tear 12/19/2017  . Left wrist pain 12/12/2017  . Left shoulder pain 08/22/2017  . Chronic pain of right knee 08/22/2017  . Acute pain of right knee 04/17/2017  . Pain of left calf 04/13/2017  . 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 Hyperglycemia 09/23/2015  . Anxiety state 09/23/2015  . Abnormal urine odor 08/25/2015  . GERD (gastroesophageal reflux disease) 08/25/2015  . Upper abdominal pain 04/16/2015  . Early satiety 04/16/2015  . Dysuria 04/16/2015  . Encounter for well adult exam with abnormal findings 06/18/2014  . Greater trochanteric  bursitis of right hip 01/06/2013  . Left lumbar radiculitis 04/03/2012  . Eczema 04/03/2012  . 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 BURSITIS, RIGHT HIP 08/25/2009  . INSOMNIA-SLEEP DISORDER-UNSPEC 08/25/2009  . FATIGUE 08/25/2009  . Cervicalgia 08/14/2007  . Depression 03/15/2007  . HLD (hyperlipidemia) 12/18/2006  . Essential hypertension 12/18/2006  . ALLERGIC RHINITIS 12/18/2006  . RENAL CALCULUS, HX OF 12/18/2006    Past Medical History:  Diagnosis Date  . ALLERGIC RHINITIS 12/18/2006   Qualifier: Diagnosis of  By: [place], [place]    . Anxiety   . Arthritis   . DEPRESSION, SITUATIONAL 03/15/2007   Qualifier: Diagnosis of  By: Wynona Luna   . GERD (gastroesophageal reflux disease)   . Headache    occasional  . History of kidney stones   . HYPERLIPIDEMIA 12/18/2006   Qualifier: History of  By: Danny Lawless CMA, [place]    . Hypertension   . HYPERTENSION 12/18/2006   Qualifier: Diagnosis of  By: Danny Lawless CMA, [place]    . INSOMNIA-SLEEP DISORDER-UNSPEC 08/25/2009   Qualifier: Diagnosis of  By: Jenny Reichmann MD, [place]   . Pain in knee region after total knee replacement ([place]) 04/03/2012  . Pneumonia   . RENAL CALCULUS, HX OF 12/18/2006   Qualifier: Diagnosis of  By: Danny Lawless CMA, [place]      Family History  Problem Relation Age of Onset  . Stroke Mother   . Heart disease Father   . Lung cancer Sister   . Lung cancer Brother   . Healthy Daughter   . Healthy Daughter   . Healthy Daughter    Past Surgical History:  Procedure Laterality Date  . CERVICAL DISC  SURGERY     x 3  . CONVERSION TO TOTAL KNEE Left 10/09/2016   Procedure: Conversion left uni compartment arthroplasty to total knee arthroplasty;  Surgeon: Paralee Cancel, MD;  Location: [facility];  Service: Orthopedics;  Laterality: Left;  90 mins  . KNEE ARTHROSCOPY     partial  . total left knee arthoplasty     10/09/16 Dr. Alvan Dame   Social History   Social History Narrative  . Not on file   Immunization History  Administered  Date(s) Administered  . Influenza Whole 11/12/2012  . Influenza,inj,Quad PF,6+ Mos 12/11/2017  . Influenza-Unspecified 12/15/2013, 12/12/2015, 11/29/2016  . Td 11/03/2002  . Tdap 09/23/2015  . Zoster 08/12/2012  . Zoster Recombinat (Shingrix) 03/22/2018     Objective: Vital Signs: There were no vitals taken for this visit.   Physical Exam   Musculoskeletal Exam: ***  CDAI Exam: CDAI Score: Not documented Patient Global Assessment: Not documented; Provider Global Assessment: Not documented Swollen: Not documented; Tender: Not documented Joint Exam   Not documented   There is currently no information documented on the homunculus. Go to the Rheumatology activity and complete the homunculus joint exam.  Investigation: No additional findings.  Imaging: Xr Foot 2 Views Left  Result Date: 04/26/2018 First MTP, all PIP and DIPs joint space narrowing was noted.  Dorsal spurring was noted.  Inferior and posterior calcaneal spurs were noted.  Tibiotalar joint space narrowing was noted.  Juxta-articular osteopenia was noted. 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  Xr Foot 2 Views Right  Result Date: 04/26/2018 First MTP, all PIP and DIP joint space narrowing was noted.  Juxta-articular osteopenia was noted.  Possible erosion was noted at the base of fifth metatarsal.  Dorsal spurring posterior calcaneal and inferior calcaneal spurring was noted.  Tibiotalar joint space narrowing was noted. 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  Xr Hand 2 View Left  Result Date: 04/26/2018 Juxta-articular osteopenia was noted.  Metacarpocarpal, intercarpal, and radiocarpal joint space narrowing was noted.  Erosive changes were noted in the base of metacarpals carpals and ulnar styloid. 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  Xr Hand 2 View  Right  Result Date: 04/26/2018 Juxta-articular osteopenia was noted.  No MCP, intercarpal radiocarpal joint space narrowing was noted.  Some cystic changes were noted in the carpal bones. Impression: These findings are consistent with inflammatory arthritis.   Recent Labs: Lab Results  Component Value Date   WBC 6.4 05/11/2018   HGB 11.2 (L) 05/11/2018   PLT 251 05/11/2018   NA 138 05/11/2018   K 3.6 05/11/2018   CL 104 05/11/2018   CO2 26 05/11/2018   GLUCOSE 92 05/11/2018   BUN 18 05/11/2018   CREATININE 0.69 05/11/2018   BILITOT 0.6 05/11/2018   ALKPHOS 55 05/11/2018   AST 20 05/11/2018   ALT 27 05/11/2018   PROT 7.7 05/11/2018   ALBUMIN 4.0 05/11/2018   CALCIUM 9.1 05/11/2018   GFRAA >60 05/11/2018   QFTBGOLDPLUS NEGATIVE 04/26/2018  April 26, 2018 IFE IgG lambda monoclonal protein, HIV negative, hepatitis B-, hepatitis C negative, immunoglobulins mildly elevated, TB Gold negative, TSH normal, CK normal, C3-C4 normal, RNP positive, SS A+, (Smith, SSB, dsDNA negative), anti-CCP > 250, uric acid 5.0  03/14/18: ANA 1:160 Cytoplasmic, uric acid 5.3., RF 116, sed rate 22  Speciality Comments: No specialty comments available.  Procedures:  No procedures performed Allergies: Patient has no known allergies.  Assessment / Plan:     Visit Diagnoses: No diagnosis found.   Orders: No orders of the defined types were placed in this encounter.  No orders of the defined types were placed in this encounter.   Face-to-face time spent with patient was *** minutes. Greater than 50% of time was spent in counseling and coordination of care.  Follow-Up Instructions: No follow-ups on file.   Bo Merino, MD  Note - This record has been created using Editor, commissioning.  Chart creation errors have been sought, but may not always  have been located. Such creation errors do not reflect on  the standard of medical care.

## 2018-05-22 ENCOUNTER — Ambulatory Visit: Payer: Medicare HMO | Admitting: Rheumatology

## 2018-05-24 ENCOUNTER — Encounter: Payer: Self-pay | Admitting: Internal Medicine

## 2018-05-24 ENCOUNTER — Telehealth: Payer: Self-pay | Admitting: Internal Medicine

## 2018-05-24 ENCOUNTER — Other Ambulatory Visit: Payer: Self-pay

## 2018-05-24 ENCOUNTER — Inpatient Hospital Stay: Payer: Medicare HMO | Attending: Hematology | Admitting: Internal Medicine

## 2018-05-24 VITALS — BP 111/79 | HR 93 | Temp 98.4°F | Resp 18 | Ht 66.25 in | Wt 216.1 lb

## 2018-05-24 DIAGNOSIS — Z7982 Long term (current) use of aspirin: Secondary | ICD-10-CM | POA: Diagnosis not present

## 2018-05-24 DIAGNOSIS — Z79899 Other long term (current) drug therapy: Secondary | ICD-10-CM | POA: Diagnosis not present

## 2018-05-24 DIAGNOSIS — Z87442 Personal history of urinary calculi: Secondary | ICD-10-CM | POA: Insufficient documentation

## 2018-05-24 DIAGNOSIS — K219 Gastro-esophageal reflux disease without esophagitis: Secondary | ICD-10-CM | POA: Diagnosis not present

## 2018-05-24 DIAGNOSIS — E785 Hyperlipidemia, unspecified: Secondary | ICD-10-CM | POA: Diagnosis not present

## 2018-05-24 DIAGNOSIS — M129 Arthropathy, unspecified: Secondary | ICD-10-CM | POA: Diagnosis not present

## 2018-05-24 DIAGNOSIS — I10 Essential (primary) hypertension: Secondary | ICD-10-CM | POA: Diagnosis not present

## 2018-05-24 DIAGNOSIS — Z8701 Personal history of pneumonia (recurrent): Secondary | ICD-10-CM | POA: Insufficient documentation

## 2018-05-24 DIAGNOSIS — F329 Major depressive disorder, single episode, unspecified: Secondary | ICD-10-CM

## 2018-05-24 DIAGNOSIS — D472 Monoclonal gammopathy: Secondary | ICD-10-CM | POA: Diagnosis not present

## 2018-05-24 DIAGNOSIS — G4700 Insomnia, unspecified: Secondary | ICD-10-CM | POA: Diagnosis not present

## 2018-05-24 NOTE — Telephone Encounter (Signed)
Gave patient avs report and appointments for September.  °

## 2018-05-24 NOTE — Progress Notes (Signed)
[place] [phone]   [phone]  OFFICE PROGRESS NOTE  Biagio Borg, MD [address] [place] [address]  DIAGNOSIS: Monoclonal gammopathy of undetermined significance  PRIOR THERAPY: None  CURRENT THERAPY: Observation.  INTERVAL HISTORY: Pamela [name] 63 y.o. female returns to the clinic today for follow-up visit.  The patient is feeling fine today with no concerning complaints.  She was initially evaluated for suspicious monoclonal gammopathy.  The patient underwent several studies including repeat CBC, comprehensive metabolic panel, LDH, quantitative immunoglobulin with immunofixation, serum light chain as well as beta-2 microglobulin.  These labs are unremarkable except for very mild increase of IgG level.  She denied having any issues today.  She has no chest pain, shortness of breath, cough or hemoptysis.  She denied having any fever or chills.  She has no nausea, vomiting, diarrhea or constipation.  She is here today for evaluation and discussion of her lab results.  MEDICAL HISTORY: Past Medical History:  Diagnosis Date  . ALLERGIC RHINITIS 12/18/2006   Qualifier: Diagnosis of  By: [place], [place]    . Anxiety   . Arthritis   . DEPRESSION, SITUATIONAL 03/15/2007   Qualifier: Diagnosis of  By: Wynona Luna   . GERD (gastroesophageal reflux disease)   . Headache    occasional  . History of kidney stones   . HYPERLIPIDEMIA 12/18/2006   Qualifier: History of  By: Danny Lawless CMA, [place]    . Hypertension   . HYPERTENSION 12/18/2006   Qualifier: Diagnosis of  By: Danny Lawless CMA, [place]    . INSOMNIA-SLEEP DISORDER-UNSPEC 08/25/2009   Qualifier: Diagnosis of  By: Jenny Reichmann MD, [place]   . Pain in knee region after total knee replacement ([facility]) 04/03/2012  . Pneumonia   . RENAL CALCULUS, HX OF 12/18/2006   Qualifier: Diagnosis of  By: Danny Lawless CMA, [place]      ALLERGIES:  has No Known Allergies.  MEDICATIONS:  Current  Outpatient Medications  Medication Sig Dispense Refill  . ALPRAZolam (XANAX) 0.25 MG tablet TAKE 1 TABLET BY MOUTH TWICE DAILY AS NEEDED 60 tablet 5  . aspirin 81 MG chewable tablet Chew 1 tablet (81 mg total) by mouth 2 (two) times daily. Take for 4 weeks. (Patient not taking: Reported on 04/26/2018) 60 tablet 0  . atorvastatin (LIPITOR) 20 MG tablet TAKE 1 TABLET (20 MG TOTAL) BY MOUTH DAILY. 90 tablet 3  . Camphor-Menthol-Methyl Sal (TIGER BALM MUSCLE RUB EX) Apply 1 application topically daily as needed (pain).    . cholecalciferol (VITAMIN D) 1000 units tablet Take 1,000 Units by mouth daily.    . cyclobenzaprine (FLEXERIL) 5 MG tablet take 1 tablet by mouth three times a day if needed for muscle spasms 30 tablet 0  . diclofenac sodium (VOLTAREN) 1 % GEL Apply 2 g topically 4 (four) times daily. 1 Tube 2  . DULoxetine (CYMBALTA) 60 MG capsule Take 1 capsule (60 mg total) by mouth daily. 90 capsule 3  . gabapentin (NEURONTIN) 300 MG capsule   0  . losartan-hydrochlorothiazide (HYZAAR) 50-12.5 MG tablet TAKE 1 TABLET BY MOUTH DAILY 90 tablet 1  . meloxicam (MOBIC) 15 MG tablet TAKE 1 TABLET(15 MG) BY MOUTH DAILY 90 tablet 0  . metoCLOPramide (REGLAN) 10 MG tablet Take 1 tablet (10 mg total) by mouth at bedtime. 30 tablet 0  . metoCLOPramide (REGLAN) 10 MG tablet Take 1 tablet (10 mg total) by mouth 2 (two) times  daily. 60 tablet 11  . pantoprazole (PROTONIX) 40 MG tablet take 1 tablet by mouth once daily (Patient not taking: Reported on 04/26/2018) 90 tablet 1  . pantoprazole (PROTONIX) 40 MG tablet Take 1 tablet (40 mg total) by mouth daily. (Patient not taking: Reported on 05/11/2018) 30 tablet 11  . traMADol (ULTRAM) 50 MG tablet take 1 tablet by mouth every 6 hours if needed for pain 120 tablet 2  . zolpidem (AMBIEN) 10 MG tablet TAKE 1 TABLET BY MOUTH AT BEDTIME FOR SLEEP 90 tablet 1   No current facility-administered medications for this visit.     SURGICAL HISTORY:  Past Surgical  History:  Procedure Laterality Date  . CERVICAL DISC SURGERY     x 3  . CONVERSION TO TOTAL KNEE Left 10/09/2016   Procedure: Conversion left uni compartment arthroplasty to total knee arthroplasty;  Surgeon: Paralee Cancel, MD;  Location: [facility];  Service: Orthopedics;  Laterality: Left;  90 mins  . KNEE ARTHROSCOPY     partial  . total left knee arthoplasty     10/09/16 Dr. Alvan Dame    REVIEW OF SYSTEMS:  A comprehensive review of systems was negative.   PHYSICAL EXAMINATION: General appearance: alert, cooperative and no distress Head: Normocephalic, without obvious abnormality, atraumatic Neck: no adenopathy, no JVD, supple, symmetrical, trachea midline and thyroid not enlarged, symmetric, no tenderness/mass/nodules Lymph nodes: Cervical, supraclavicular, and axillary nodes normal. Resp: clear to auscultation bilaterally Back: symmetric, no curvature. ROM normal. No CVA tenderness. Cardio: regular rate and rhythm, S1, S2 normal, no murmur, click, rub or gallop GI: soft, non-tender; bowel sounds normal; no masses,  no organomegaly Extremities: extremities normal, atraumatic, no cyanosis or edema  ECOG PERFORMANCE STATUS: 0 - Asymptomatic  Blood pressure 111/79, pulse 93, temperature 98.4 F (36.9 C), temperature source Oral, resp. rate 18, height 5' 6.25" (1.683 m), weight 216 lb 1.6 oz (98 kg), SpO2 99 %.  LABORATORY DATA: Lab Results  Component Value Date   WBC 6.4 05/11/2018   HGB 11.2 (L) 05/11/2018   HCT 33.9 (L) 05/11/2018   MCV 81.1 05/11/2018   PLT 251 05/11/2018      Chemistry      Component Value Date/Time   NA 138 05/11/2018 0807   K 3.6 05/11/2018 0807   CL 104 05/11/2018 0807   CO2 26 05/11/2018 0807   BUN 18 05/11/2018 0807   CREATININE 0.69 05/11/2018 0807   CREATININE 0.88 04/26/2018 0913      Component Value Date/Time   CALCIUM 9.1 05/11/2018 0807   ALKPHOS 55 05/11/2018 0807   AST 20 05/11/2018 0807   ALT 27 05/11/2018 0807   BILITOT 0.6 05/11/2018  0807       RADIOGRAPHIC STUDIES: Xr Foot 2 Views Left  Result Date: 04/26/2018 First MTP, all PIP and DIPs joint space narrowing was noted.  Dorsal spurring was noted.  Inferior and posterior calcaneal spurs were noted.  Tibiotalar joint space narrowing was noted.  Juxta-articular osteopenia was noted. 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  Xr Foot 2 Views Right  Result Date: 04/26/2018 First MTP, all PIP and DIP joint space narrowing was noted.  Juxta-articular osteopenia was noted.  Possible erosion was noted at the base of fifth metatarsal.  Dorsal spurring posterior calcaneal and inferior calcaneal spurring was noted.  Tibiotalar joint space narrowing was noted. 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  Xr Hand 2 View Left  Result Date: 04/26/2018  Juxta-articular osteopenia was noted.  Metacarpocarpal, intercarpal, and radiocarpal joint space narrowing was noted.  Erosive changes were noted in the base of metacarpals carpals and ulnar styloid. 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  Xr Hand 2 View Right  Result Date: 04/26/2018 Juxta-articular osteopenia was noted.  No MCP, intercarpal radiocarpal joint space narrowing was noted.  Some cystic changes were noted in the carpal bones. Impression: These findings are consistent with inflammatory arthritis.   ASSESSMENT AND PLAN: This is a very pleasant 63 years old African-American female with very mild IgA monoclonal gammopathy. The patient had a recent myeloma panel that showed no concerning findings.  Her elevated IgA could be partially inflammatory in origin.  The patient has no findings consistent with multiple myeloma at this point. I recommended for her to continue on observation with repeat myeloma panel in 6 months.  If she has any worsening of her protein study in the future, I  may consider the patient for bone marrow biopsy and aspirate. The patient was advised to call immediately if she has any concerning symptoms in the interval. The patient voices understanding of current disease status and treatment options and is in agreement with the current care plan.  All questions were answered. The patient knows to call the clinic with any problems, questions or concerns. We can certainly see the patient much sooner if necessary.  I spent 10 minutes counseling the patient face to face. The total time spent in the appointment was 15 minutes.  Disclaimer: This note was dictated with voice recognition software. Similar sounding words can inadvertently be transcribed and may not be corrected upon review.

## 2018-05-24 NOTE — Telephone Encounter (Signed)
Copied from [place] [phone]. Topic: Quick Communication - See Telephone Encounter >> May 24, 2018  3:47 PM Sheran Luz wrote: CRM for notification. See Telephone encounter for: 05/24/18.  Patient states that pharmacy does not have losartan-hydrochlorothiazide (HYZAAR) 50-12.5 MG tablet but they do have the tablets separated. Patient is requesting new RX, if possible. Please advise.

## 2018-05-27 MED ORDER — LOSARTAN POTASSIUM 50 MG PO TABS: 50.0000 mg | ORAL_TABLET | Freq: Every day | ORAL | 3 refills | Status: DC

## 2018-05-27 MED ORDER — HYDROCHLOROTHIAZIDE 12.5 MG PO CAPS: 12.5000 mg | ORAL_CAPSULE | Freq: Every day | ORAL | 3 refills | Status: DC

## 2018-05-27 NOTE — Addendum Note (Signed)
Addended by: Biagio Borg on: 05/27/2018 01:09 PM   Modules accepted: Orders

## 2018-05-27 NOTE — Telephone Encounter (Signed)
Pt called back to follow up on separating rx for losartan-hydrochlorothiazide (HYZAAR) 50-12.5 MG tablet due to nationwide backorder. Please advise.    [facility] [phone] - [place], [place] - [address] AT [address] [phone] (Phone) [phone] (Fax)

## 2018-05-27 NOTE — Telephone Encounter (Signed)
Called pt, LVM.   

## 2018-05-27 NOTE — Telephone Encounter (Signed)
Dr. Jenny Reichmann please advise. It looks like this message was sent on Friday while we were both out of office.

## 2018-05-27 NOTE — Telephone Encounter (Signed)
Ok to change to same medication in 2 separate pills- done erx

## 2018-05-27 NOTE — Telephone Encounter (Signed)
Patient states she did not get hydrochlorothiazide from pharmacy.  Patient is requesting script to be resent.

## 2018-05-28 MED ORDER — HYDROCHLOROTHIAZIDE 12.5 MG PO CAPS: 12.5000 mg | ORAL_CAPSULE | Freq: Every day | ORAL | 3 refills | Status: DC

## 2018-05-28 NOTE — Addendum Note (Signed)
Addended by: Juliet Rude on: 05/28/2018 07:43 AM   Modules accepted: Orders

## 2018-05-28 NOTE — Telephone Encounter (Signed)
It has been resent

## 2018-05-30 ENCOUNTER — Encounter: Payer: Medicare HMO | Admitting: Hematology

## 2018-06-17 ENCOUNTER — Telehealth: Payer: Self-pay | Admitting: Rheumatology

## 2018-06-17 NOTE — Telephone Encounter (Signed)
Patient states she is aching knees, wrists and shoulders. Having trouble when going from sitting to standing position. Patient states she is having pain but denies swelling. Patient states she has been trying ibuprofen but it is not helping with her pain. Patient scheduled for phone visit on 06/18/18.

## 2018-06-17 NOTE — Telephone Encounter (Signed)
Patient calling asking for an rx to be sent into [facility] on [address]. Patient states her joints are aching, she is aching all over. (wrists, shoulders, ankle)  Please call to advise.

## 2018-06-18 ENCOUNTER — Telehealth (INDEPENDENT_AMBULATORY_CARE_PROVIDER_SITE_OTHER): Payer: Medicare HMO | Admitting: Rheumatology

## 2018-06-18 DIAGNOSIS — Z5329 Procedure and treatment not carried out because of patient's decision for other reasons: Secondary | ICD-10-CM

## 2018-06-18 NOTE — Progress Notes (Deleted)
April 26, 2018 CBC, CMP, UA negative, CK 95, TSH normal TB Gold negative, IFE IgG lambda monoclonal protein detected, IgG elevated, hepatitis B-, hepatitis C negative, HIV negative, uric acid 5.0, anti-CCP> 250, RNP positive, Ro positive, (SSB negative, Smith negative, dsDNA negative) 03/14/18: ANA 1:160 Cytoplasmic, uric acid 53., RF 116, sed rate 22

## 2018-06-18 NOTE — Progress Notes (Deleted)
Virtual Visit via Telephone Note  I connected with Pamela [name] on 06/18/18 at 11:45 AM EDT by telephone and verified that I am speaking with the correct person using two identifiers.   I discussed the limitations, risks, security and privacy concerns of performing an evaluation and management service by telephone and the availability of in person appointments. I also discussed with the patient that there may be a patient responsible charge related to this service. The patient expressed understanding and agreed to proceed.  CC:  History of Present Illness: Patient is a 63 year old female with a past medical history of    Review of Systems  Constitutional: Negative for fever and malaise/fatigue.  Eyes: Negative for photophobia, pain, discharge and redness.  Respiratory: Negative for cough, shortness of breath and wheezing.   Cardiovascular: Negative for chest pain and palpitations.  Gastrointestinal: Negative for blood in stool, constipation and diarrhea.  Genitourinary: Negative for dysuria.  Musculoskeletal: Negative for back pain, joint pain, myalgias and neck pain.  Skin: Negative for rash.  Neurological: Negative for dizziness and headaches.  Psychiatric/Behavioral: Negative for depression. The patient is not nervous/anxious and does not have insomnia.     Observations/Objective:  Physical Exam  Constitutional: She is oriented to person, place, and time.  Neurological: She is alert and oriented to person, place, and time.  Psychiatric: Mood, memory, affect and judgment normal.   Patient reports morning stiffness for  {minute/hour:19697}.   Patient {Actions; denies-reports:120008} nocturnal pain.  Difficulty dressing/grooming: {ACTIONS;DENIES/REPORTS:21021675::"Denies"} Difficulty climbing stairs: {ACTIONS;DENIES/REPORTS:21021675::"Denies"} Difficulty getting out of chair: {ACTIONS;DENIES/REPORTS:21021675::"Denies"} Difficulty using hands for taps, buttons, cutlery, and/or  writing: {ACTIONS;DENIES/REPORTS:21021675::"Denies"}   Findings:  April 26, 2018 CBC, CMP, UA negative, CK 95, TSH normal TB Gold negative, IFE IgG lambda monoclonal protein detected, IgG elevated, hepatitis B-, hepatitis C negative, HIV negative, uric acid 5.0, anti-CCP> 250, RNP positive, Ro positive, (SSB negative, Smith negative, dsDNA negative) 03/14/18: ANA 1:160 Cytoplasmic, uric acid 53., RF 116, sed rate 22 Assessment and Plan:   Follow Up Instructions:    I discussed the assessment and treatment plan with the patient. The patient was provided an opportunity to ask questions and all were answered. The patient agreed with the plan and demonstrated an understanding of the instructions.   The patient was advised to call back or seek an in-person evaluation if the symptoms worsen or if the condition fails to improve as anticipated.  I provided *** minutes of non-face-to-face time during this encounter.   Ofilia Neas, PA-C

## 2018-06-19 NOTE — Progress Notes (Signed)
We could not reach patient for the tele-visit. Bo Merino, MD

## 2018-06-20 ENCOUNTER — Telehealth: Payer: Self-pay | Admitting: Rheumatology

## 2018-06-20 NOTE — Telephone Encounter (Signed)
Attempted to contact the patient and left message for patient to call the office.  

## 2018-06-20 NOTE — Telephone Encounter (Signed)
Patient left a voicemail requesting Dr. Estanislado Pandy call in prescription for pain medicine.

## 2018-06-24 ENCOUNTER — Ambulatory Visit: Payer: Medicare HMO

## 2018-06-26 ENCOUNTER — Telehealth: Payer: Self-pay | Admitting: Rheumatology

## 2018-06-26 NOTE — Telephone Encounter (Signed)
Patient called stating she had a NPT appointment with Dr. Estanislado Pandy in February.  Patient states Dr. Estanislado Pandy referred her to Dr. Julien Nordmann who she has been seeing.  Patient states her "bone pain" has increased and Ibuprofen and Tylenol are not helping.  Patient states Dr. Julien Nordmann told her she should contact Dr. Estanislado Pandy.  Patient is requesting a prescription for pain relief sent to [facility] at [place]. [address].

## 2018-06-27 ENCOUNTER — Other Ambulatory Visit: Payer: Self-pay

## 2018-06-27 ENCOUNTER — Telehealth (INDEPENDENT_AMBULATORY_CARE_PROVIDER_SITE_OTHER): Payer: Medicare HMO | Admitting: Rheumatology

## 2018-06-27 ENCOUNTER — Encounter: Payer: Self-pay | Admitting: Rheumatology

## 2018-06-27 DIAGNOSIS — R808 Other proteinuria: Secondary | ICD-10-CM | POA: Diagnosis not present

## 2018-06-27 DIAGNOSIS — S46012D Strain of muscle(s) and tendon(s) of the rotator cuff of left shoulder, subsequent encounter: Secondary | ICD-10-CM | POA: Diagnosis not present

## 2018-06-27 DIAGNOSIS — I10 Essential (primary) hypertension: Secondary | ICD-10-CM

## 2018-06-27 DIAGNOSIS — F329 Major depressive disorder, single episode, unspecified: Secondary | ICD-10-CM

## 2018-06-27 DIAGNOSIS — F32A Depression, unspecified: Secondary | ICD-10-CM

## 2018-06-27 DIAGNOSIS — M359 Systemic involvement of connective tissue, unspecified: Secondary | ICD-10-CM

## 2018-06-27 DIAGNOSIS — R809 Proteinuria, unspecified: Secondary | ICD-10-CM

## 2018-06-27 DIAGNOSIS — M5136 Other intervertebral disc degeneration, lumbar region: Secondary | ICD-10-CM

## 2018-06-27 DIAGNOSIS — M0579 Rheumatoid arthritis with rheumatoid factor of multiple sites without organ or systems involvement: Secondary | ICD-10-CM | POA: Diagnosis not present

## 2018-06-27 DIAGNOSIS — Z8639 Personal history of other endocrine, nutritional and metabolic disease: Secondary | ICD-10-CM

## 2018-06-27 DIAGNOSIS — Z8719 Personal history of other diseases of the digestive system: Secondary | ICD-10-CM

## 2018-06-27 DIAGNOSIS — Z79899 Other long term (current) drug therapy: Secondary | ICD-10-CM | POA: Diagnosis not present

## 2018-06-27 DIAGNOSIS — Z96652 Presence of left artificial knee joint: Secondary | ICD-10-CM

## 2018-06-27 DIAGNOSIS — M51369 Other intervertebral disc degeneration, lumbar region without mention of lumbar back pain or lower extremity pain: Secondary | ICD-10-CM

## 2018-06-27 DIAGNOSIS — M1711 Unilateral primary osteoarthritis, right knee: Secondary | ICD-10-CM | POA: Diagnosis not present

## 2018-06-27 DIAGNOSIS — F419 Anxiety disorder, unspecified: Secondary | ICD-10-CM

## 2018-06-27 MED ORDER — METHOTREXATE 2.5 MG PO TABS: ORAL_TABLET | ORAL | 0 refills | Status: DC

## 2018-06-27 MED ORDER — PREDNISONE 5 MG PO TABS: ORAL_TABLET | ORAL | 0 refills | Status: DC

## 2018-06-27 MED ORDER — FOLIC ACID 1 MG PO TABS: 2.0000 mg | ORAL_TABLET | Freq: Every day | ORAL | 3 refills | Status: DC

## 2018-06-27 NOTE — Progress Notes (Signed)
Virtual Visit via Telephone Note  I connected with Pamela [name] on 06/27/18 at  3:30 PM EDT by telephone and verified that I am speaking with the correct person using two identifiers.   I discussed the limitations, risks, security and privacy concerns of performing an evaluation and management service by telephone and the availability of in person appointments. I also discussed with the patient that there may be a patient responsible charge related to this service. The patient expressed understanding and agreed to proceed.  CC: Pain in multiple joints   History of Present Illness: Patient is a 63 year old female with a past medical history of seropositive rheumatoid arthritis, osteoarthritis, and DDD.  She is having pain in multiple joints including both hips, both hands, and the right knee joint.  She continues to have bilateral shoulder pain.   She denies any recent rashes, Raynaud's, or sicca symptoms.    She rates her RA a 8/10.    Review of Systems  Constitutional: Positive for malaise/fatigue. Negative for fever.  Eyes: Negative for photophobia, pain, discharge and redness.  Respiratory: Negative for cough, shortness of breath and wheezing.   Cardiovascular: Negative for chest pain and palpitations.  Gastrointestinal: Negative for blood in stool, constipation and diarrhea.  Genitourinary: Negative for dysuria.  Musculoskeletal: Positive for joint pain. Negative for back pain, myalgias and neck pain.       +morning stiffness  Skin: Negative for rash.  Neurological: Negative for dizziness and headaches.  Psychiatric/Behavioral: Negative for depression. The patient has insomnia. The patient is not nervous/anxious.    Observations/Objective: Physical Exam  Constitutional: She is oriented to person, place, and time.  Neurological: She is alert and oriented to person, place, and time.  Psychiatric: Mood, memory, affect and judgment normal.   Patient reports morning stiffness for  30  minutes.   Patient reports nocturnal pain.  Difficulty dressing/grooming: Denies Difficulty climbing stairs: Reports Difficulty getting out of chair: Reports Difficulty using hands for taps, buttons, cutlery, and/or writing: Reports   Findings:  April 26, 2018 CBC normal, CMP potassium 3.4, UA 2+ protein, IgG elevated, TB Gold negative, hepatitis B-, hepatitis C negative, HIV negative, SPEP abnormal CK 95, TSH normal, uric acid 5.0, anti-CCP> 250, RNP 4.1, SSA 2.6, (Smith, SSB, dsDNA negative), C3-C4 normal, uric acid 5.0, IFE showed IgG monoclonal protein with lambda light chain specificity. September 23, 2015 chest x-ray normal Who felt that the abnormal IFE is due to inflammatory response.   Assessment and Plan: Rheumatoid arthritis involving multiple sites with positive rheumatoid factor ([place]) - 03/14/18: ANA 1:160 Cytoplasmic, uric acid 53., RF 116, sed rate 22 -patient has significant synovitis in her left wrist joint and decreased range of motion.  X-rays revealed erosive rheumatoid arthritis: She continues to have pain and swelling in multiple joints. She has pain and swelling in both hands/wrists, both knee joints, and both hip joints. She has occasional shoulder joint pain.  We discussed starting her on Methotrexate for treatment of RA.  Indications, contraindications, and potential side effects of MTX were discussed.  She will start on MTX 6 tablets po once weekly and if labs are stable in 2 weeks she will increase to 8 tablets po once weekly.  She will start taking folic acid 2 mg po daily.  We will also send in a prednisone taper starting at 20 mg and taper by 5 mg every week.  She will come by the office today to fill out the consent form and get  drug information about MTX.  She will follow up our office in 2-3 months.   Drug Counseling TB Gold: Negative 04/26/18 Hepatitis panel:  Negative 04/26/18 Chest-xray:  09/23/15 Contraception: Post-menopausal  Alcohol use: Discussed avoiding  alcohol use  Patient was counseled on the purpose, proper use, and adverse effects of methotrexate including nausea, infection, and signs and symptoms of pneumonitis.  Reviewed instructions with patient to take methotrexate weekly along with folic acid daily.  Discussed the importance of frequent monitoring of kidney and liver function and blood counts, and provided patient with standing lab instructions.  Counseled patient to avoid NSAIDs and alcohol while on methotrexate.  Provided patient with educational materials on methotrexate and answered all questions.  Advised patient to get annual influenza vaccine and to get a pneumococcal vaccine if patient has not already had one.  Patient voiced understanding.  Patient consented to methotrexate use.  Will upload into chart.    High risk medication use - Standing orders will be placed today.  She will start on MTX 6 tablets by mouth once weekly x2 weeks and if labs are stable she will increase to MTX 8 tablets po once weekly.  She will take folic acid 2 mg po daily.   Pain in both hands - x-ray showed juxta-articular osteopenia and erosive changes in the left wrist joint.  She is having increased pain in both hands.  She has intermittent joint swelling.  She will start on prednisone taper and MTX.   Pain in both feet - The x-ray showed juxta-articular osteopenia and possible erosion in the fifth metatarsal base of the left foot.  Primary osteoarthritis of right knee - severe: She has chronic right knee joint pain  S/P left TK revision-Doing well.   Traumatic incomplete tear of left rotator cuff, subsequent encounter: She has chronic left shoulder pain.   Other fatigue - Chronic   DDD (degenerative disc disease), lumbar-She has intermittent discomfort.    Follow Up Instructions: She will follow up in 2-3 months.  Standing orders will be placed.  Future order for protein creatinine ratio will be placed. She will start on prednisone 20 mg and  taper by 5 mg every week.  She will start on MTX 6 tablets po once weekly for 2 weeks and if labs are stable she will increase to 8 tablets once weekly.  She will take folic acid 2 mg po daily.  She will come by the office to fill out consent form and receive information about MTX.      I discussed the assessment and treatment plan with the patient. The patient was provided an opportunity to ask questions and all were answered. The patient agreed with the plan and demonstrated an understanding of the instructions.   The patient was advised to call back or seek an in-person evaluation if the symptoms worsen or if the condition fails to improve as anticipated.  I provided 30 minutes of non-face-to-face time during this encounter.  Bo Merino, MD  Scribed by- Ofilia Neas, PA-C

## 2018-06-27 NOTE — Patient Instructions (Signed)
Methotrexate tablets What is this medicine? METHOTREXATE (METH oh TREX ate) is a chemotherapy drug used to treat cancer including breast cancer, leukemia, and lymphoma. This medicine can also be used to treat psoriasis and certain kinds of arthritis. This medicine may be used for other purposes; ask your health care provider or pharmacist if you have questions. COMMON BRAND NAME(S): Rheumatrex, Trexall What should I tell my health care provider before I take this medicine? They need to know if you have any of these conditions: -fluid in the stomach area or lungs -if you often drink alcohol -infection or immune system problems -kidney disease or on hemodialysis -liver disease -low blood counts, like low white cell, platelet, or red cell counts -lung disease -radiation therapy -stomach ulcers -ulcerative colitis -an unusual or allergic reaction to methotrexate, other medicines, foods, dyes, or preservatives -pregnant or trying to get pregnant -breast-feeding How should I use this medicine? Take this medicine by mouth with a glass of water. Follow the directions on the prescription label. Take your medicine at regular intervals. Do not take it more often than directed. Do not stop taking except on your doctor's advice. Make sure you know why you are taking this medicine and how often you should take it. If this medicine is used for a condition that is not cancer, like arthritis or psoriasis, it should be taken weekly, NOT daily. Taking this medicine more often than directed can cause serious side effects, even death. Talk to your healthcare provider about safe handling and disposal of this medicine. You may need to take special precautions. Talk to your pediatrician regarding the use of this medicine in children. While this drug may be prescribed for selected conditions, precautions do apply. Overdosage: If you think you have taken too much of this medicine contact a poison control center or  emergency room at once. NOTE: This medicine is only for you. Do not share this medicine with others. What if I miss a dose? If you miss a dose, talk with your doctor or health care professional. Do not take double or extra doses. What may interact with this medicine? This medicine may interact with the following medication: -acitretin -aspirin and aspirin-like medicines including salicylates -azathioprine -certain antibiotics like penicillins, tetracycline, and chloramphenicol -cyclosporine -gold -hydroxychloroquine -live virus vaccines -NSAIDs, medicines for pain and inflammation, like ibuprofen or naproxen -other cytotoxic agents -penicillamine -phenylbutazone -phenytoin -probenecid -retinoids such as isotretinoin and tretinoin -steroid medicines like prednisone or cortisone -sulfonamides like sulfasalazine and trimethoprim/sulfamethoxazole -theophylline This list may not describe all possible interactions. Give your health care provider a list of all the medicines, herbs, non-prescription drugs, or dietary supplements you use. Also tell them if you smoke, drink alcohol, or use illegal drugs. Some items may interact with your medicine. What should I watch for while using this medicine? Avoid alcoholic drinks. This medicine can make you more sensitive to the sun. Keep out of the sun. If you cannot avoid being in the sun, wear protective clothing and use sunscreen. Do not use sun lamps or tanning beds/booths. You may need blood work done while you are taking this medicine. Call your doctor or health care professional for advice if you get a fever, chills or sore throat, or other symptoms of a cold or flu. Do not treat yourself. This drug decreases your body's ability to fight infections. Try to avoid being around people who are sick. This medicine may increase your risk to bruise or bleed. Call your doctor or health care professional   if you notice any unusual bleeding. Check with your  doctor or health care professional if you get an attack of severe diarrhea, nausea and vomiting, or if you sweat a lot. The loss of too much body fluid can make it dangerous for you to take this medicine. Talk to your doctor about your risk of cancer. You may be more at risk for certain types of cancers if you take this medicine. Both men and women must use effective birth control with this medicine. Do not become pregnant while taking this medicine or until at least 1 normal menstrual cycle has occurred after stopping it. Women should inform their doctor if they wish to become pregnant or think they might be pregnant. Men should not father a child while taking this medicine and for 3 months after stopping it. There is a potential for serious side effects to an unborn child. Talk to your health care professional or pharmacist for more information. Do not breast-feed an infant while taking this medicine. What side effects may I notice from receiving this medicine? Side effects that you should report to your doctor or health care professional as soon as possible: -allergic reactions like skin rash, itching or hives, swelling of the face, lips, or tongue -breathing problems or shortness of breath -diarrhea -dry, nonproductive cough -low blood counts - this medicine may decrease the number of white blood cells, red blood cells and platelets. You may be at increased risk for infections and bleeding. -mouth sores -redness, blistering, peeling or loosening of the skin, including inside the mouth -signs of infection - fever or chills, cough, sore throat, pain or trouble passing urine -signs and symptoms of bleeding such as bloody or black, tarry stools; red or dark-brown urine; spitting up blood or brown material that looks like coffee grounds; red spots on the skin; unusual bruising or bleeding from the eye, gums, or nose -signs and symptoms of kidney injury like trouble passing urine or change in the amount  of urine -signs and symptoms of liver injury like dark yellow or brown urine; general ill feeling or flu-like symptoms; light-colored stools; loss of appetite; nausea; right upper belly pain; unusually weak or tired; yellowing of the eyes or skin Side effects that usually do not require medical attention (report to your doctor or health care professional if they continue or are bothersome): -dizziness -hair loss -tiredness -upset stomach -vomiting This list may not describe all possible side effects. Call your doctor for medical advice about side effects. You may report side effects to FDA at 1-800-FDA-1088. Where should I keep my medicine? Keep out of the reach of children. Store at room temperature between 20 and 25 degrees C (68 and 77 degrees F). Protect from light. Throw away any unused medicine after the expiration date. NOTE: This sheet is a summary. It may not cover all possible information. If you have questions about this medicine, talk to your doctor, pharmacist, or health care provider.  2019 Elsevier/Gold Standard (2016-10-19 13:38:43)  

## 2018-06-27 NOTE — Telephone Encounter (Signed)
Spoke with patient and schedule her a virtual visit for today at 3:30 pm.

## 2018-06-27 NOTE — Progress Notes (Signed)
April 26, 2018 CBC normal, CMP potassium 3.4, UA 2+ protein, IgG elevated, TB Gold negative, hepatitis B-, hepatitis C negative, HIV negative, SPEP abnormal CK 95, TSH normal, uric acid 5.0, anti-CCP> 250, RNP 4.1, SSA 2.6, ([place], SSB, dsDNA negative), C3-C4 normal, uric acid 5.0, IFE showed IgG monoclonal protein with lambda light chain specificity. September 23, 2015 chest x-ray normal Who felt that the abnormal IFE is due to inflammatory response.

## 2018-06-28 ENCOUNTER — Telehealth: Payer: Self-pay | Admitting: Internal Medicine

## 2018-06-28 MED ORDER — TRAMADOL HCL 50 MG PO TABS: ORAL_TABLET | ORAL | 2 refills | Status: DC

## 2018-06-28 NOTE — Telephone Encounter (Signed)
Don erx 

## 2018-07-02 ENCOUNTER — Telehealth: Payer: Self-pay | Admitting: Rheumatology

## 2018-07-02 NOTE — Telephone Encounter (Signed)
Patient calling because she has a few questions in reference to new medication doctor put her on. Please call to discuss.

## 2018-07-02 NOTE — Telephone Encounter (Signed)
I returned patient's call.  She wanted to clarify the dose of methotrexate and folic acid.  I advised her to take methotrexate 6 tablets/week and get labs in 2 weeks later.

## 2018-07-05 IMAGING — DX DG CHEST 2V
2 series · 2 of 2 positions shown · non-contrast
Comparison: 06/29/2008

CLINICAL DATA: Hypertension, weight loss, nonsmoker

EXAM:
CHEST  2 VIEW

[chest pa]
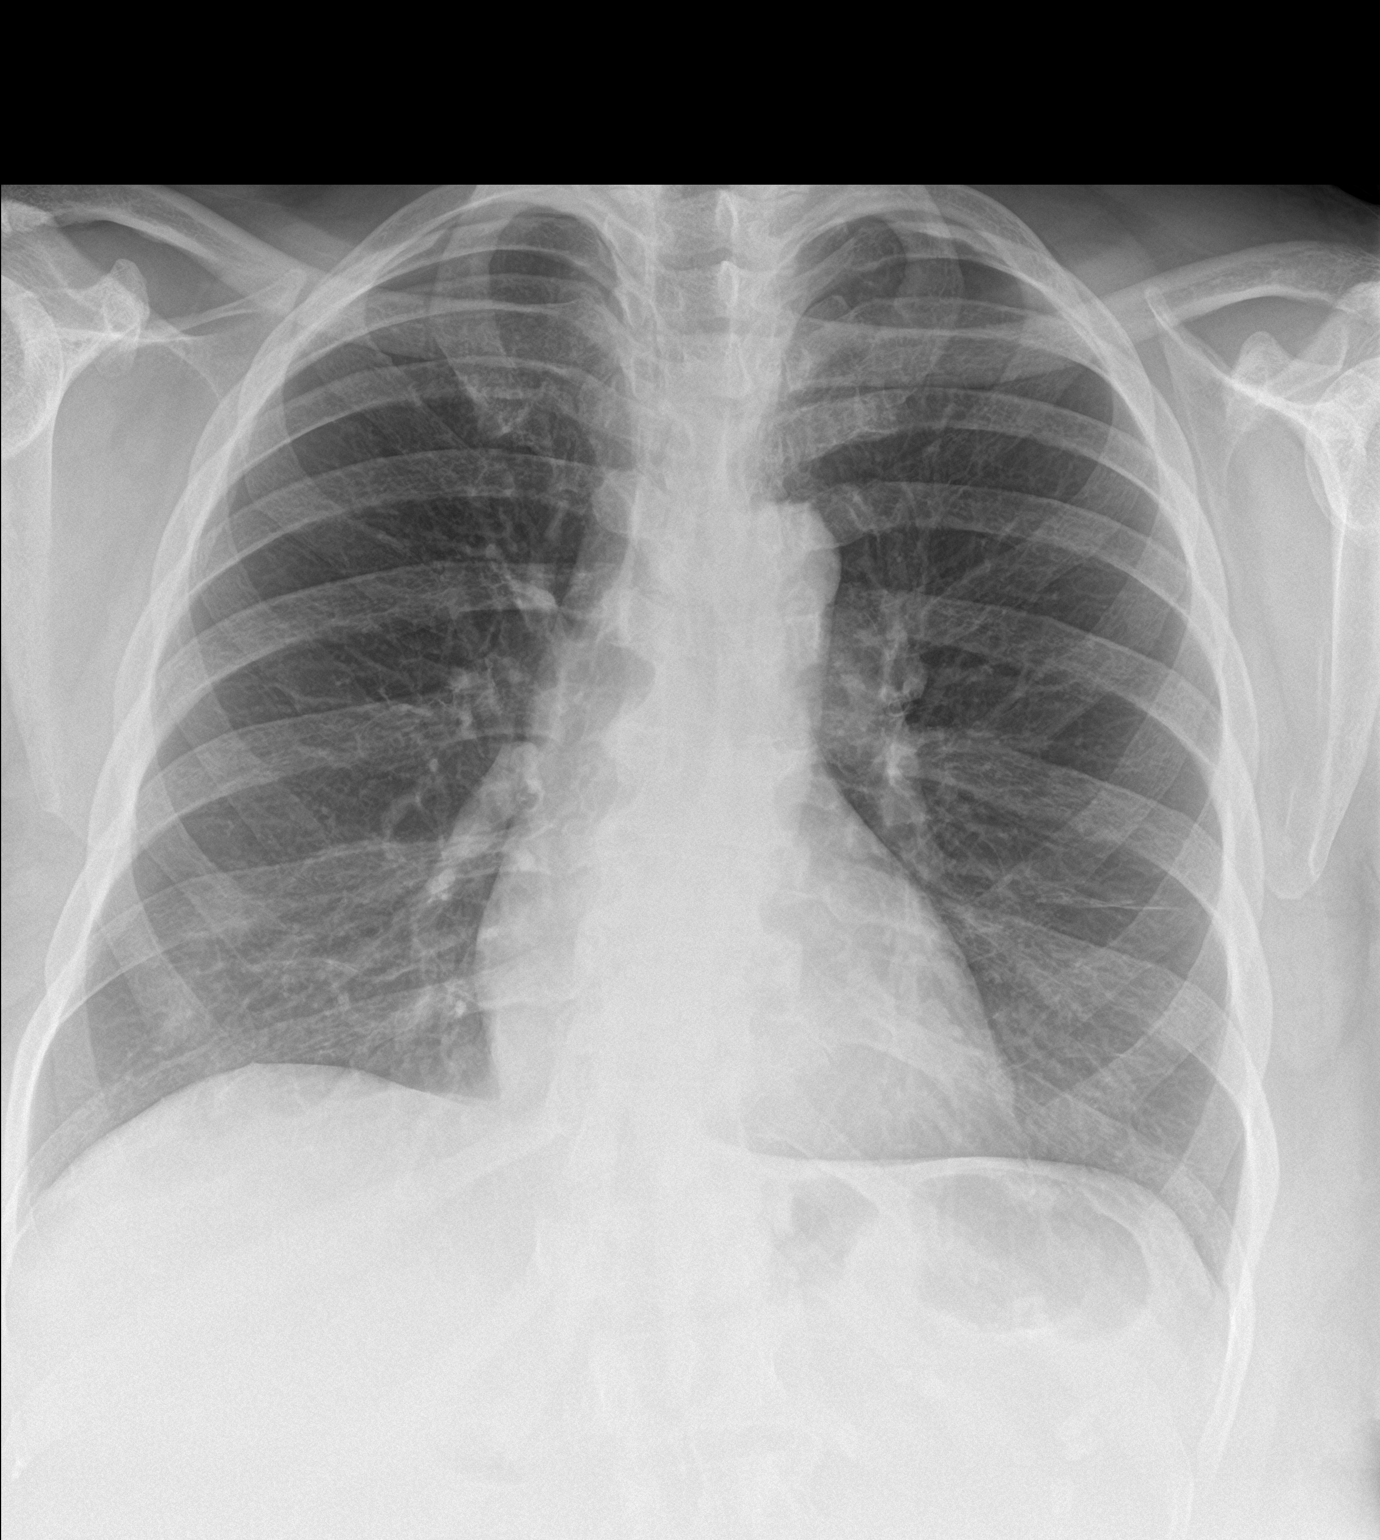

[chest lat]
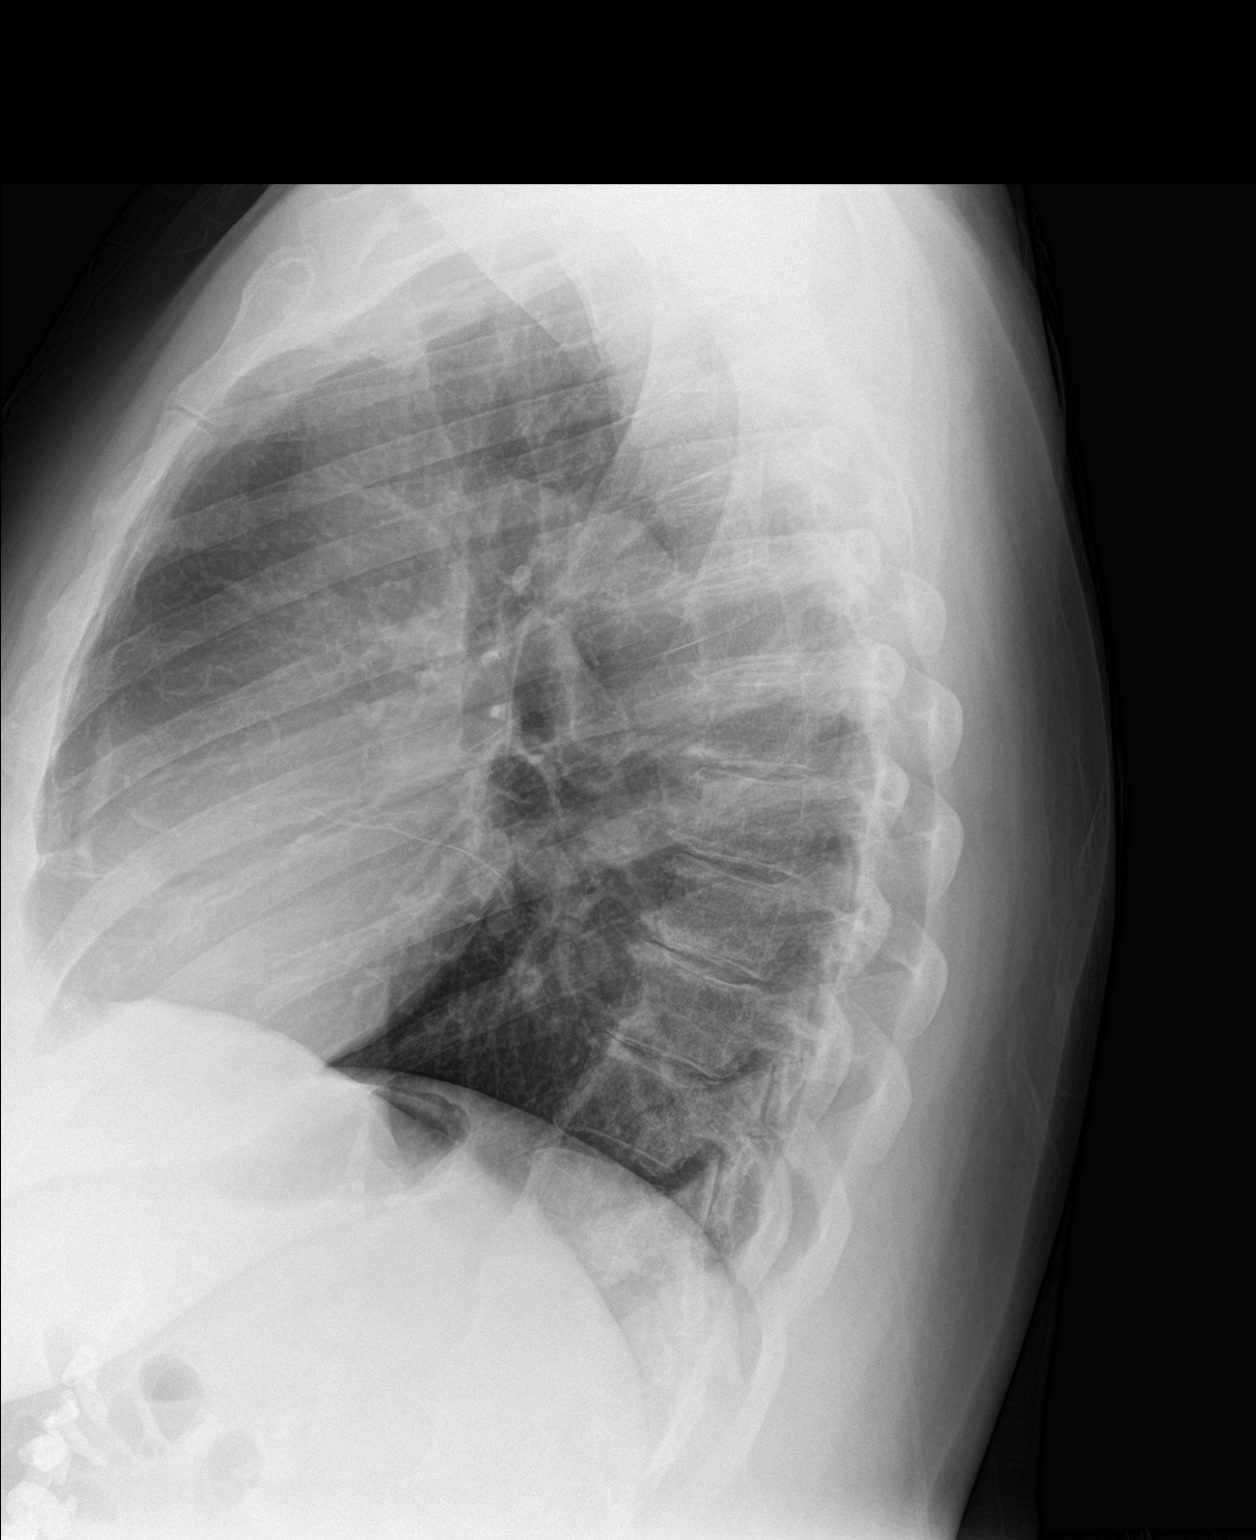

[2 of 2 positions shown; findings below may reference images not displayed]

FINDINGS: Normal heart size, mediastinal contours, and pulmonary vascularity.

Minimal bronchitic changes.

Linear scarring lingula.

No acute infiltrate, pleural effusion, or pneumothorax.

Prior cervical spine fusion.

No acute osseous findings.
IMPRESSION: Minimal bronchitic changes and lingular scarring.

No acute abnormalities.

## 2018-07-17 ENCOUNTER — Other Ambulatory Visit: Payer: Self-pay | Admitting: *Deleted

## 2018-07-17 ENCOUNTER — Telehealth: Payer: Self-pay | Admitting: *Deleted

## 2018-07-17 DIAGNOSIS — Z79899 Other long term (current) drug therapy: Secondary | ICD-10-CM

## 2018-07-17 DIAGNOSIS — R809 Proteinuria, unspecified: Secondary | ICD-10-CM

## 2018-07-17 NOTE — Telephone Encounter (Signed)
Spoke with patient and she wanted to know when she is due for labs. Patient advised she is due now and lab orders released.

## 2018-07-18 DIAGNOSIS — R809 Proteinuria, unspecified: Secondary | ICD-10-CM | POA: Diagnosis not present

## 2018-07-18 DIAGNOSIS — Z79899 Other long term (current) drug therapy: Secondary | ICD-10-CM | POA: Diagnosis not present

## 2018-07-19 LAB — COMPLETE METABOLIC PANEL WITH GFR
AG Ratio: 1.4 (calc) (ref 1.0–2.5)
ALT: 20 U/L (ref 6–29)
AST: 14 U/L (ref 10–35)
Albumin: 4 g/dL (ref 3.6–5.1)
Alkaline phosphatase (APISO): 45 U/L (ref 37–153)
BUN: 18 mg/dL (ref 7–25)
CO2: 28 mmol/L (ref 20–32)
Calcium: 9.2 mg/dL (ref 8.6–10.4)
Chloride: 105 mmol/L (ref 98–110)
Creat: 0.91 mg/dL (ref 0.50–0.99)
GFR, Est African American: 78 mL/min/{1.73_m2} (ref >=60)
GFR, Est Non African American: 67 mL/min/{1.73_m2} (ref >=60)
Globulin: 2.8 g/dL (calc) (ref 1.9–3.7)
Glucose, Bld: 88 mg/dL (ref 65–99)
Potassium: 3.7 mmol/L (ref 3.5–5.3)
Sodium: 140 mmol/L (ref 135–146)
Total Bilirubin: 0.6 mg/dL (ref 0.2–1.2)
Total Protein: 6.8 g/dL (ref 6.1–8.1)

## 2018-07-19 LAB — PROTEIN / CREATININE RATIO, URINE
Creatinine, Urine: 76 mg/dL (ref 20–275)
Protein/Creat Ratio: 92 mg/g creat (ref 21–161)
Protein/Creatinine Ratio: 0.092 mg/mg creat (ref 0.021–0.16)
Total Protein, Urine: 7 mg/dL (ref 5–24)

## 2018-07-19 LAB — CBC WITH DIFFERENTIAL/PLATELET
Absolute Monocytes: 540 cells/uL (ref 200–950)
Basophils Absolute: 59 cells/uL (ref 0–200)
Basophils Relative: 0.8 %
Eosinophils Absolute: 252 cells/uL (ref 15–500)
Eosinophils Relative: 3.4 %
HCT: 33.8 % — ABNORMAL LOW (ref 35.0–45.0)
Hemoglobin: 11.2 g/dL — ABNORMAL LOW (ref 11.7–15.5)
Lymphs Abs: 1983 cells/uL (ref 850–3900)
MCH: 27.7 pg (ref 27.0–33.0)
MCHC: 33.1 g/dL (ref 32.0–36.0)
MCV: 83.7 fL (ref 80.0–100.0)
MPV: 10.3 fL (ref 7.5–12.5)
Monocytes Relative: 7.3 %
Neutro Abs: 4566 cells/uL (ref 1500–7800)
Neutrophils Relative %: 61.7 %
Platelets: 247 10*3/uL (ref 140–400)
RBC: 4.04 10*6/uL (ref 3.80–5.10)
RDW: 13.3 % (ref 11.0–15.0)
Total Lymphocyte: 26.8 %
WBC: 7.4 10*3/uL (ref 3.8–10.8)

## 2018-07-19 NOTE — Progress Notes (Signed)
CMP WNL.  Protein/creatinine ratio WNL.  Hgb and Hct stable.  Rest of CBC WNL.

## 2018-07-25 ENCOUNTER — Telehealth: Payer: Self-pay | Admitting: Rheumatology

## 2018-07-25 MED ORDER — METHOTREXATE 2.5 MG PO TABS: 20.0000 mg | ORAL_TABLET | ORAL | 2 refills | Status: DC

## 2018-07-25 NOTE — Telephone Encounter (Signed)
Patient states she is needing a prescription refill on MTX.   Last Visit: 06/27/18 Next Visit: 09/03/18 Labs: 07/18/18 CMP WNL. Hgb and Hct stable. Rest of CBC WNL.  Okay to refill per Dr. Estanislado Pandy

## 2018-07-25 NOTE — Telephone Encounter (Signed)
Patient left a voicemail requesting a return call to discuss her medication.   

## 2018-08-02 ENCOUNTER — Other Ambulatory Visit: Payer: Self-pay | Admitting: *Deleted

## 2018-08-02 DIAGNOSIS — Z79899 Other long term (current) drug therapy: Secondary | ICD-10-CM | POA: Diagnosis not present

## 2018-08-03 LAB — COMPLETE METABOLIC PANEL WITH GFR
AG Ratio: 1.4 (calc) (ref 1.0–2.5)
ALT: 16 U/L (ref 6–29)
AST: 16 U/L (ref 10–35)
Albumin: 4.1 g/dL (ref 3.6–5.1)
Alkaline phosphatase (APISO): 65 U/L (ref 37–153)
BUN: 22 mg/dL (ref 7–25)
CO2: 27 mmol/L (ref 20–32)
Calcium: 9.5 mg/dL (ref 8.6–10.4)
Chloride: 105 mmol/L (ref 98–110)
Creat: 0.89 mg/dL (ref 0.50–0.99)
GFR, Est African American: 80 mL/min/{1.73_m2} (ref >=60)
GFR, Est Non African American: 69 mL/min/{1.73_m2} (ref >=60)
Globulin: 2.9 g/dL (calc) (ref 1.9–3.7)
Glucose, Bld: 103 mg/dL — ABNORMAL HIGH (ref 65–99)
Potassium: 4 mmol/L (ref 3.5–5.3)
Sodium: 141 mmol/L (ref 135–146)
Total Bilirubin: 0.4 mg/dL (ref 0.2–1.2)
Total Protein: 7 g/dL (ref 6.1–8.1)

## 2018-08-03 LAB — CBC WITH DIFFERENTIAL/PLATELET
Absolute Monocytes: 680 cells/uL (ref 200–950)
Basophils Absolute: 53 cells/uL (ref 0–200)
Basophils Relative: 0.8 %
Eosinophils Absolute: 211 cells/uL (ref 15–500)
Eosinophils Relative: 3.2 %
HCT: 35.3 % (ref 35.0–45.0)
Hemoglobin: 11.7 g/dL (ref 11.7–15.5)
Lymphs Abs: 1234 cells/uL (ref 850–3900)
MCH: 27.3 pg (ref 27.0–33.0)
MCHC: 33.1 g/dL (ref 32.0–36.0)
MCV: 82.3 fL (ref 80.0–100.0)
MPV: 10.3 fL (ref 7.5–12.5)
Monocytes Relative: 10.3 %
Neutro Abs: 4422 cells/uL (ref 1500–7800)
Neutrophils Relative %: 67 %
Platelets: 258 10*3/uL (ref 140–400)
RBC: 4.29 10*6/uL (ref 3.80–5.10)
RDW: 13.3 % (ref 11.0–15.0)
Total Lymphocyte: 18.7 %
WBC: 6.6 10*3/uL (ref 3.8–10.8)

## 2018-08-06 NOTE — Progress Notes (Signed)
Glucose is 103. Rest of lab work is WNL.

## 2018-08-20 ENCOUNTER — Other Ambulatory Visit: Payer: Self-pay | Admitting: Physician Assistant

## 2018-08-20 ENCOUNTER — Ambulatory Visit (INDEPENDENT_AMBULATORY_CARE_PROVIDER_SITE_OTHER): Payer: Medicare HMO

## 2018-08-20 DIAGNOSIS — Z23 Encounter for immunization: Secondary | ICD-10-CM | POA: Diagnosis not present

## 2018-08-20 DIAGNOSIS — Z299 Encounter for prophylactic measures, unspecified: Secondary | ICD-10-CM

## 2018-08-20 NOTE — Progress Notes (Deleted)
Office Visit Note  Patient: Pamela [name]             Date of Birth: 1955-06-29           MRN: [id number]             PCP: Biagio Borg, MD Referring: Biagio Borg, MD Visit Date: 09/03/2018 Occupation: @GUAROCC @  Subjective:  No chief complaint on file.   History of Present Illness: Pamela [name] is a 63 y.o. female ***   Activities of Daily Living:  Patient reports morning stiffness for *** {minute/hour:19697}.   Patient {ACTIONS;DENIES/REPORTS:21021675::"Denies"} nocturnal pain.  Difficulty dressing/grooming: {ACTIONS;DENIES/REPORTS:21021675::"Denies"} Difficulty climbing stairs: {ACTIONS;DENIES/REPORTS:21021675::"Denies"} Difficulty getting out of chair: {ACTIONS;DENIES/REPORTS:21021675::"Denies"} Difficulty using hands for taps, buttons, cutlery, and/or writing: {ACTIONS;DENIES/REPORTS:21021675::"Denies"}  No Rheumatology ROS completed.   PMFS History:  Patient Active Problem List   Diagnosis Date Noted  . MGUS (monoclonal gammopathy of unknown significance) 05/11/2018  . Rheumatoid arthritis involving multiple sites with positive rheumatoid factor ([place]) 05/03/2018  . High risk medication use 05/03/2018  . Status post total left knee replacement 05/03/2018  . DDD (degenerative disc disease), lumbar 05/03/2018  . Primary osteoarthritis of right knee 04/05/2018  . 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 Left rotator cuff tear 12/19/2017  . Left wrist pain 12/12/2017  . Left shoulder pain 08/22/2017  . Chronic pain of right knee 08/22/2017  . Acute pain of right knee 04/17/2017  . Pain of left calf 04/13/2017  . 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 Hyperglycemia 09/23/2015  . Anxiety state 09/23/2015  . Abnormal urine odor 08/25/2015  . GERD (gastroesophageal reflux disease) 08/25/2015  . Upper abdominal pain 04/16/2015  . Early satiety 04/16/2015  . Dysuria 04/16/2015  . Encounter for well adult exam with abnormal findings 06/18/2014  . Greater trochanteric  bursitis of right hip 01/06/2013  . Left lumbar radiculitis 04/03/2012  . Eczema 04/03/2012  . 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 BURSITIS, RIGHT HIP 08/25/2009  . INSOMNIA-SLEEP DISORDER-UNSPEC 08/25/2009  . FATIGUE 08/25/2009  . Cervicalgia 08/14/2007  . Depression 03/15/2007  . HLD (hyperlipidemia) 12/18/2006  . Essential hypertension 12/18/2006  . ALLERGIC RHINITIS 12/18/2006  . RENAL CALCULUS, HX OF 12/18/2006    Past Medical History:  Diagnosis Date  . ALLERGIC RHINITIS 12/18/2006   Qualifier: Diagnosis of  By: [place], [place]    . Anxiety   . Arthritis   . DEPRESSION, SITUATIONAL 03/15/2007   Qualifier: Diagnosis of  By: Wynona Luna   . GERD (gastroesophageal reflux disease)   . Headache    occasional  . History of kidney stones   . HYPERLIPIDEMIA 12/18/2006   Qualifier: History of  By: Danny Lawless CMA, [place]    . Hypertension   . HYPERTENSION 12/18/2006   Qualifier: Diagnosis of  By: Danny Lawless CMA, [place]    . INSOMNIA-SLEEP DISORDER-UNSPEC 08/25/2009   Qualifier: Diagnosis of  By: Jenny Reichmann MD, [place]   . Pain in knee region after total knee replacement ([place]) 04/03/2012  . Pneumonia   . RENAL CALCULUS, HX OF 12/18/2006   Qualifier: Diagnosis of  By: Danny Lawless CMA, [place]      Family History  Problem Relation Age of Onset  . Stroke Mother   . Heart disease Father   . Lung cancer Sister   . Lung cancer Brother   . Healthy Daughter   . Healthy Daughter   . Healthy Daughter    Past Surgical History:  Procedure Laterality Date  . CERVICAL DISC  SURGERY     x 3  . CONVERSION TO TOTAL KNEE Left 10/09/2016   Procedure: Conversion left uni compartment arthroplasty to total knee arthroplasty;  Surgeon: Paralee Cancel, MD;  Location: [facility];  Service: Orthopedics;  Laterality: Left;  90 mins  . KNEE ARTHROSCOPY     partial  . total left knee arthoplasty     10/09/16 Dr. Alvan Dame   Social History   Social History Narrative  . Not on file   Immunization History  Administered  Date(s) Administered  . Influenza Whole 11/12/2012  . Influenza,inj,Quad PF,6+ Mos 12/11/2017  . Influenza-Unspecified 12/15/2013, 12/12/2015, 11/29/2016  . Td 11/03/2002  . Tdap 09/23/2015  . Zoster 08/12/2012  . Zoster Recombinat (Shingrix) 03/22/2018     Objective: Vital Signs: There were no vitals taken for this visit.   Physical Exam   Musculoskeletal Exam: ***  CDAI Exam: CDAI Score: Not documented Patient Global Assessment: Not documented; Provider Global Assessment: Not documented Swollen: Not documented; Tender: Not documented Joint Exam   Not documented   There is currently no information documented on the homunculus. Go to the Rheumatology activity and complete the homunculus joint exam.  Investigation: No additional findings.  Imaging: No results found.  Recent Labs: Lab Results  Component Value Date   WBC 6.6 08/02/2018   HGB 11.7 08/02/2018   PLT 258 08/02/2018   NA 141 08/02/2018   K 4.0 08/02/2018   CL 105 08/02/2018   CO2 27 08/02/2018   GLUCOSE 103 (H) 08/02/2018   BUN 22 08/02/2018   CREATININE 0.89 08/02/2018   BILITOT 0.4 08/02/2018   ALKPHOS 55 05/11/2018   AST 16 08/02/2018   ALT 16 08/02/2018   PROT 7.0 08/02/2018   ALBUMIN 4.0 05/11/2018   CALCIUM 9.5 08/02/2018   GFRAA 80 08/02/2018   QFTBGOLDPLUS NEGATIVE 04/26/2018    Speciality Comments: No specialty comments available.  Procedures:  No procedures performed Allergies: Patient has no known allergies.   Assessment / Plan:     Visit Diagnoses: Rheumatoid arthritis involving multiple sites with positive rheumatoid factor ([facility])  Autoimmune disease ([place])  High risk medication use  Other proteinuria  Primary osteoarthritis of right knee  S/P left TK revision  Traumatic incomplete tear of left rotator cuff, subsequent encounter  DDD (degenerative disc disease), lumbar  Essential hypertension  History of hyperlipidemia  History of gastroesophageal reflux (GERD)   Anxiety and depression   Orders: No orders of the defined types were placed in this encounter.  No orders of the defined types were placed in this encounter.   Face-to-face time spent with patient was *** minutes. Greater than 50% of time was spent in counseling and coordination of care.  Follow-Up Instructions: No follow-ups on file.   Ofilia Neas, PA-C  Note - This record has been created using Dragon software.  Chart creation errors have been sought, but may not always  have been located. Such creation errors do not reflect on  the standard of medical care.

## 2018-08-21 ENCOUNTER — Telehealth: Payer: Self-pay | Admitting: Rheumatology

## 2018-08-21 NOTE — Telephone Encounter (Signed)
Patient called requesting prescription refill of Methotrexate to be sent to [facility] at [place]. [employer].  Patient states her dosage might need to be increased due to having a lot of pain in her legs and hips when she walks.

## 2018-08-21 NOTE — Progress Notes (Signed)
Office Visit Note  Patient: Pamela [name]             Date of Birth: 1955-08-20           MRN: [id number]             PCP: Biagio Borg, MD Referring: Biagio Borg, MD Visit Date: 08/22/2018 Occupation: @GUAROCC @  Subjective:  Left trochanteric bursitis   History of Present Illness: Pamela [name] is a 63 y.o. female with history of seropositive rheumatoid arthritis and osteoarthritis. She was started on MTX in mid-April 2020. She increased her dose of MTX to 8 tablets po once weekly yesterday.   She has noticed improvement since starting on MTX.  She denies any joint swelling at this time. She states the pain in both hands and both wrist joints have diminished.  She has noticed increased grip strength and less stiffness in her hands.  She is having left trochanteric bursitis and a deep ache in bilateral lower extremities.  She describes the pain as "bone bane."  She states her right knee joint pain has improved.    Activities of Daily Living:  Patient reports joint stiffness lasting all day.   Patient Denies nocturnal pain.  Difficulty dressing/grooming: Denies Difficulty climbing stairs: Reports Difficulty getting out of chair: Reports Difficulty using hands for taps, buttons, cutlery, and/or writing: Denies  Review of Systems  Constitutional: Positive for fatigue.  HENT: Negative for mouth sores, mouth dryness and nose dryness.   Eyes: Negative for pain, visual disturbance and dryness.  Respiratory: Negative for cough, hemoptysis, shortness of breath and difficulty breathing.   Cardiovascular: Negative for chest pain, palpitations, hypertension and swelling in legs/feet.  Gastrointestinal: Negative for blood in stool, constipation and diarrhea.  Endocrine: Negative for increased urination.  Genitourinary: Negative for difficulty urinating and painful urination.  Musculoskeletal: Positive for arthralgias, joint pain, joint swelling and morning stiffness. Negative for myalgias,  muscle weakness, muscle tenderness and myalgias.  Skin: Negative for color change, pallor, rash, hair loss, nodules/bumps, skin tightness, ulcers and sensitivity to sunlight.  Allergic/Immunologic: Negative for susceptible to infections.  Neurological: Negative for dizziness, numbness and headaches.  Hematological: Negative for bruising/bleeding tendency and swollen glands.  Psychiatric/Behavioral: Positive for sleep disturbance. Negative for depressed mood. The patient is not nervous/anxious.     PMFS History:  Patient Active Problem List   Diagnosis Date Noted  . MGUS (monoclonal gammopathy of unknown significance) 05/11/2018  . Rheumatoid arthritis involving multiple sites with positive rheumatoid factor ([place]) 05/03/2018  . High risk medication use 05/03/2018  . Status post total left knee replacement 05/03/2018  . DDD (degenerative disc disease), lumbar 05/03/2018  . Primary osteoarthritis of right knee 04/05/2018  . 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 Left rotator cuff tear 12/19/2017  . Left wrist pain 12/12/2017  . Left shoulder pain 08/22/2017  . Chronic pain of right knee 08/22/2017  . Acute pain of right knee 04/17/2017  . Pain of left calf 04/13/2017  . 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 Hyperglycemia 09/23/2015  . Anxiety state 09/23/2015  . Abnormal urine odor 08/25/2015  . GERD (gastroesophageal reflux disease) 08/25/2015  . Upper abdominal pain 04/16/2015  . Early satiety 04/16/2015  . Dysuria 04/16/2015  . Encounter for well adult exam with abnormal findings 06/18/2014  . Greater trochanteric bursitis of right hip 01/06/2013  . Left lumbar radiculitis 04/03/2012  . Eczema 04/03/2012  . 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 BURSITIS, RIGHT HIP 08/25/2009  .  INSOMNIA-SLEEP DISORDER-UNSPEC 08/25/2009  . FATIGUE 08/25/2009  . Cervicalgia 08/14/2007  . Depression 03/15/2007  . HLD (hyperlipidemia) 12/18/2006  . Essential hypertension 12/18/2006  . ALLERGIC RHINITIS  12/18/2006  . RENAL CALCULUS, HX OF 12/18/2006    Past Medical History:  Diagnosis Date  . ALLERGIC RHINITIS 12/18/2006   Qualifier: Diagnosis of  By: [place], [place]    . Anxiety   . Arthritis   . DEPRESSION, SITUATIONAL 03/15/2007   Qualifier: Diagnosis of  By: Wynona Luna   . GERD (gastroesophageal reflux disease)   . Headache    occasional  . History of kidney stones   . HYPERLIPIDEMIA 12/18/2006   Qualifier: History of  By: Danny Lawless CMA, [place]    . Hypertension   . HYPERTENSION 12/18/2006   Qualifier: Diagnosis of  By: Danny Lawless CMA, [place]    . INSOMNIA-SLEEP DISORDER-UNSPEC 08/25/2009   Qualifier: Diagnosis of  By: Jenny Reichmann MD, Hunt Oris   . Pain in knee region after total knee replacement ([place]) 04/03/2012  . Pneumonia   . RENAL CALCULUS, HX OF 12/18/2006   Qualifier: Diagnosis of  By: Danny Lawless CMA, [place]      Family History  Problem Relation Age of Onset  . Stroke Mother   . Heart disease Father   . Lung cancer Sister   . Lung cancer Brother   . Healthy Daughter   . Healthy Daughter   . Healthy Daughter    Past Surgical History:  Procedure Laterality Date  . CERVICAL DISC SURGERY     x 3  . CONVERSION TO TOTAL KNEE Left 10/09/2016   Procedure: Conversion left uni compartment arthroplasty to total knee arthroplasty;  Surgeon: Paralee Cancel, MD;  Location: [facility];  Service: Orthopedics;  Laterality: Left;  90 mins  . KNEE ARTHROSCOPY     partial  . total left knee arthoplasty     10/09/16 Dr. Alvan Dame   Social History   Social History Narrative  . Not on file   Immunization History  Administered Date(s) Administered  . Influenza Whole 11/12/2012  . Influenza,inj,Quad PF,6+ Mos 12/11/2017  . Influenza-Unspecified 12/15/2013, 12/12/2015, 11/29/2016  . Td 11/03/2002  . Tdap 09/23/2015  . Zoster 08/12/2012  . Zoster Recombinat (Shingrix) 03/22/2018, 08/20/2018     Objective: Vital Signs: BP (!) 144/86 (BP Location: Left Arm, Patient Position: Sitting, Cuff Size:  Normal)   Pulse (!) 101   Resp 16   Ht 5' 8.5" (1.74 m)   Wt 236 lb 12.8 oz (107.4 kg)   BMI 35.48 kg/m    Physical Exam Vitals signs and nursing note reviewed.  Constitutional:      Appearance: She is well-developed.  HENT:     Head: Normocephalic and atraumatic.  Eyes:     Conjunctiva/sclera: Conjunctivae normal.  Neck:     Musculoskeletal: Normal range of motion.  Cardiovascular:     Rate and Rhythm: Normal rate and regular rhythm.     Heart sounds: Normal heart sounds.  Pulmonary:     Effort: Pulmonary effort is normal.     Breath sounds: Normal breath sounds.  Abdominal:     General: Bowel sounds are normal.     Palpations: Abdomen is soft.  Lymphadenopathy:     Cervical: No cervical adenopathy.  Skin:    General: Skin is warm and dry.     Capillary Refill: Capillary refill takes less than 2 seconds.  Neurological:     Mental Status: She is alert and oriented to person, place,  and time.  Psychiatric:        Behavior: Behavior normal.      Musculoskeletal Exam: C-spine, thoracic spine, and lumbar spine good ROM.  No midline spinal tenderness.  No SI joint tenderness.  Left shoulder has some discomfort with ROM.  Right shoulder full ROM.  Elbow joints good ROM.  Left wrist has tenderness, warmth, and swelling on the dorsal aspect.  MCPs, PIPs, and DIPs good ROM with no synovitis.  Complete fist formation bilaterally.  Hip joints good ROM with no discomfort.  Tenderness over the left trochanteric bursa. Left knee replacement has warmth but no effusion.  Right knee mild warmth.  No tenderness or swelling of ankle joints.    CDAI Exam: CDAI Score: 4.3  Patient Global: 8 mm; Provider Global: 5 mm Swollen: 2 ; Tender: 1  Joint Exam      Right  Left  Wrist     Swollen Tender  Knee  Swollen         Investigation: No additional findings.  Imaging: No results found.  Recent Labs: Lab Results  Component Value Date   WBC 6.6 08/02/2018   HGB 11.7 08/02/2018    PLT 258 08/02/2018   NA 141 08/02/2018   K 4.0 08/02/2018   CL 105 08/02/2018   CO2 27 08/02/2018   GLUCOSE 103 (H) 08/02/2018   BUN 22 08/02/2018   CREATININE 0.89 08/02/2018   BILITOT 0.4 08/02/2018   ALKPHOS 55 05/11/2018   AST 16 08/02/2018   ALT 16 08/02/2018   PROT 7.0 08/02/2018   ALBUMIN 4.0 05/11/2018   CALCIUM 9.5 08/02/2018   GFRAA 80 08/02/2018   QFTBGOLDPLUS NEGATIVE 04/26/2018    Speciality Comments: No specialty comments available.  Procedures:  No procedures performed Allergies: Patient has no known allergies.   Assessment / Plan:     Visit Diagnoses: Rheumatoid arthritis involving multiple sites with positive rheumatoid factor ([place]) - 03/14/18: ANA 1:160 Cytoplasmic, uric acid 53., RF 116, sed rate 22: She continues to have tenderness and inflammation of the left wrist joint on exam.  She has mild warmth of the right knee on exam.  She is noticed improvement since starting on methotrexate in April 2020.  She increased her dose of methotrexate to 8 tablets by mouth once weekly yesterday.  She continues to have some arthralgias but has no other joint swelling at this time.  Her morning stiffness has improved and she has noticed increased grip strength in bilateral hands.  She will continue taking methotrexate tablets by mouth once weekly and folic acid 2 mg by mouth daily.  She does not need any refills at this time.  She was advised to notify us if she develops increased joint pain or joint swelling.  She will return for lab work in 2 weeks, then 2 months, then every 3 months.  She will follow-up in the office in 3 months.   High risk medication use - She was started on MTX during virtual visit on 06/27/18 -She increased her dose of MTX to 8 tablets po once weekly yesterday.  She will return for lab work in 2 weeks to monitor for drug toxicity.  Standing orders are in place.  She is advised to hold methotrexate anytime she has infection and to resume once the infection is  cleared.  The importance of social distancing and following standard precautions recommended by the CDC were discussed.    Primary osteoarthritis of right knee -She has good ROM with no  discomfort.  Mild warmth noted.  Her right knee discomfort has improved since starting on MTX.   S/P left TK revision - Doing well.  She has good ROM.  Mild warmth noted.  No joint effusion.  Trochanteric bursitis, left hip - She has tenderness over the left trochanteric bursa on exam.  She has been experiencing nocturnal pain and discomfort when laying on her left side.  Plan: We discussed stretching exercises she can perform.  She was given a handout of these exercises.  She was advised to notify us if her symptoms worsen, and she can return for a cortisone injection.    Traumatic incomplete tear of left rotator cuff, subsequent encounter - She has discomfort with ROM.    DDD (degenerative disc disease), lumbar - She has no discomfort at this time.  She has good ROM.  No midline spinal tenderness.    Other medical conditions are listed as follows:   Essential hypertension   History of hyperlipidemia   Other proteinuria   History of gastroesophageal reflux (GERD)   Anxiety and depression  Orders: No orders of the defined types were placed in this encounter.  No orders of the defined types were placed in this encounter.     Follow-Up Instructions: Return in about 3 months (around 11/22/2018) for Rheumatoid arthritis, Osteoarthritis.   Ofilia Neas, PA-C   I examined and evaluated the patient with Hazel Sams PA.  Patient had left wrist joint swelling but says that her joints have improved.  He would like to give methotrexate more time and see response to the treatment.  Patient is supposed to notify us if she is her symptoms get worse or do not improve.  The plan of care was discussed as noted above.  Bo Merino, MD  Note - This record has been created using Editor, commissioning.  Chart  creation errors have been sought, but may not always  have been located. Such creation errors do not reflect on  the standard of medical care.

## 2018-08-21 NOTE — Telephone Encounter (Signed)
Spoke with patient and advised her that her prescription for MTX was sent to the pharmacy on 07/25/18 for a 30 day supply with 2 additional refills. Patient will contact pharmacy to have her MTX refilled. Patient has been having increased pain in her legs and hips. Patient had an appointment for 09/03/18. Patient has been rescheduled for a sooner appointment on 08/22/18 at 9:15 am.

## 2018-08-22 ENCOUNTER — Other Ambulatory Visit: Payer: Self-pay

## 2018-08-22 ENCOUNTER — Ambulatory Visit: Payer: Medicare HMO | Admitting: Rheumatology

## 2018-08-22 ENCOUNTER — Encounter: Payer: Self-pay | Admitting: Rheumatology

## 2018-08-22 VITALS — BP 144/86 | HR 101 | Resp 16 | Ht 68.5 in | Wt 236.8 lb

## 2018-08-22 DIAGNOSIS — Z96652 Presence of left artificial knee joint: Secondary | ICD-10-CM | POA: Diagnosis not present

## 2018-08-22 DIAGNOSIS — M7062 Trochanteric bursitis, left hip: Secondary | ICD-10-CM | POA: Diagnosis not present

## 2018-08-22 DIAGNOSIS — M0579 Rheumatoid arthritis with rheumatoid factor of multiple sites without organ or systems involvement: Secondary | ICD-10-CM

## 2018-08-22 DIAGNOSIS — F419 Anxiety disorder, unspecified: Secondary | ICD-10-CM

## 2018-08-22 DIAGNOSIS — M1711 Unilateral primary osteoarthritis, right knee: Secondary | ICD-10-CM | POA: Diagnosis not present

## 2018-08-22 DIAGNOSIS — Z79899 Other long term (current) drug therapy: Secondary | ICD-10-CM | POA: Diagnosis not present

## 2018-08-22 DIAGNOSIS — I10 Essential (primary) hypertension: Secondary | ICD-10-CM

## 2018-08-22 DIAGNOSIS — M51369 Other intervertebral disc degeneration, lumbar region without mention of lumbar back pain or lower extremity pain: Secondary | ICD-10-CM

## 2018-08-22 DIAGNOSIS — Z8719 Personal history of other diseases of the digestive system: Secondary | ICD-10-CM

## 2018-08-22 DIAGNOSIS — R808 Other proteinuria: Secondary | ICD-10-CM

## 2018-08-22 DIAGNOSIS — F32A Depression, unspecified: Secondary | ICD-10-CM

## 2018-08-22 DIAGNOSIS — F329 Major depressive disorder, single episode, unspecified: Secondary | ICD-10-CM

## 2018-08-22 DIAGNOSIS — S46012D Strain of muscle(s) and tendon(s) of the rotator cuff of left shoulder, subsequent encounter: Secondary | ICD-10-CM

## 2018-08-22 DIAGNOSIS — M5136 Other intervertebral disc degeneration, lumbar region: Secondary | ICD-10-CM | POA: Diagnosis not present

## 2018-08-22 DIAGNOSIS — Z8639 Personal history of other endocrine, nutritional and metabolic disease: Secondary | ICD-10-CM

## 2018-08-22 NOTE — Patient Instructions (Addendum)
Standing Labs We placed an order today for your standing lab work.    Please come back and get your standing labs in 2 weeks, 2 months, then every 3 months   We have open lab daily Monday through Thursday from 8:30-12:30 PM and 1:30-4:30 PM and Friday from 8:30-12:30 PM and 1:30 -4:00 PM at the office of Dr. Bo Merino.   You may experience shorter wait times on Monday and Friday afternoons. The office is located at [address] [place], [place], [place] [address] No appointment is necessary.   Labs are drawn by [facility].  You may receive a bill from [facility] for your lab work.  If you wish to have your labs drawn at another location, please call the office 24 hours in advance to send orders.  If you have any questions regarding directions or hours of operation,  please call [phone].   Just as a reminder please drink plenty of water prior to coming for your lab work. Thanks!    Trochanteric Bursitis Rehab Ask your health care provider which exercises are safe for you. Do exercises exactly as told by your health care provider and adjust them as directed. It is normal to feel mild stretching, pulling, tightness, or discomfort as you do these exercises, but you should stop right away if you feel sudden pain or your pain gets worse.Do not begin these exercises until told by your health care provider. Stretching exercises These exercises warm up your muscles and joints and improve the movement and flexibility of your hip. These exercises also help to relieve pain and stiffness. Exercise A: Iliotibial band stretch  1. Lie on your side with your left / right leg in the top position. 2. Bend your left / right knee and grab your ankle. 3. Slowly bring your knee back so your thigh is behind your body. 4. Slowly lower your knee toward the floor until you feel a gentle stretch on the outside of your left / right thigh. If you do not feel a stretch and your knee will not fall farther,  place the heel of your other foot on top of your outer knee and pull your thigh down farther. 5. Hold this position for __________ seconds. 6. Slowly return to the starting position. Repeat __________ times. Complete this exercise __________ times a day. Strengthening exercises These exercises build strength and endurance in your hip and pelvis. Endurance is the ability to use your muscles for a long time, even after they get tired. Exercise B: Bridge (hip extensors)  1. Lie on your back on a firm surface with your knees bent and your feet flat on the floor. 2. Tighten your buttocks muscles and lift your buttocks off the floor until your trunk is level with your thighs. You should feel the muscles working in your buttocks and the back of your thighs. If this exercise is too easy, try doing it with your arms crossed over your chest. 3. Hold this position for __________ seconds. 4. Slowly return to the starting position. 5. Let your muscles relax completely between repetitions. Repeat __________ times. Complete this exercise __________ times a day. Exercise C: Squats (knee extensors and  quadriceps) 1. Stand in front of a table, with your feet and knees pointing straight ahead. You may rest your hands on the table for balance but not for support. 2. Slowly bend your knees and lower your hips like you are going to sit in a chair. ? Keep your weight over your  heels, not over your toes. ? Keep your lower legs upright so they are parallel with the table legs. ? Do not let your hips go lower than your knees. ? Do not bend lower than told by your health care provider. ? If your hip pain increases, do not bend as low. 3. Hold this position for __________ seconds. 4. Slowly push with your legs to return to standing. Do not use your hands to pull yourself to standing. Repeat __________ times. Complete this exercise __________ times a day. Exercise D: Hip hike 1. Stand sideways on a bottom step. Stand  on your left / right leg with your other foot unsupported next to the step. You can hold onto the railing or wall if needed for balance. 2. Keeping your knees straight and your torso square, lift your left / right hip up toward the ceiling. 3. Hold this position for __________ seconds. 4. Slowly let your left / right hip lower toward the floor, past the starting position. Your foot should get closer to the floor. Do not lean or bend your knees. Repeat __________ times. Complete this exercise __________ times a day. Exercise E: Single leg stand 1. Stand near a counter or door frame that you can hold onto for balance as needed. It is helpful to stand in front of a mirror for this exercise so you can watch your hip. 2. Squeeze your left / right buttock muscles then lift up your other foot. Do not let your left / right hip push out to the side. 3. Hold this position for __________ seconds. Repeat __________ times. Complete this exercise __________ times a day. This information is not intended to replace advice given to you by your health care provider. Make sure you discuss any questions you have with your health care provider. Document Released: 04/06/2004 Document Revised: 11/04/2015 Document Reviewed: 02/12/2015 Elsevier Interactive Patient Education  2019 [place] Band Syndrome Rehab Ask your health care provider which exercises are safe for you. Do exercises exactly as told by your health care provider and adjust them as directed. It is normal to feel mild stretching, pulling, tightness, or discomfort as you do these exercises, but you should stop right away if you feel sudden pain or your pain gets worse.Do not begin these exercises until told by your health care provider. Stretching and range of motion exercises These exercises warm up your muscles and joints and improve the movement and flexibility of your hip and pelvis. Exercise A: Quadriceps, prone  7. Lie on your abdomen  on a firm surface, such as a bed or padded floor. 8. Bend your left / right knee and hold your ankle. If you cannot reach your ankle or pant leg, loop a belt around your foot and grab the belt instead. 9. Gently pull your heel toward your buttocks. Your knee should not slide out to the side. You should feel a stretch in the front of your thigh and knee. 10. Hold this position for __________ seconds. Repeat __________ times. Complete this stretch __________ times a day. Exercise B: Iliotibial band  6. Lie on your side with your left / right leg in the top position. 7. Bend both of your knees and grab your left / right ankle. Stretch out your bottom arm to help you balance. 8. Slowly bring your top knee back so your thigh goes behind your trunk. 9. Slowly lower your top leg toward the floor until you feel a gentle stretch on the outside  of your left / right hip and thigh. If you do not feel a stretch and your knee will not fall farther, place the heel of your other foot on top of your knee and pull your knee down toward the floor with your foot. 10. Hold this position for __________ seconds. Repeat __________ times. Complete this stretch __________ times a day. Strengthening exercises These exercises build strength and endurance in your hip and pelvis. Endurance is the ability to use your muscles for a long time, even after they get tired. Exercise C: Straight leg raises (hip abductors)  1. Lie on your side with your left / right leg in the top position. Lie so your head, shoulder, knee, and hip line up. You may bend your bottom knee to help you balance. 2. Roll your hips slightly forward so your hips are stacked directly over each other and your left / right knee is facing forward. 3. Tense the muscles in your outer thigh and lift your top leg 4-6 inches (10-15 cm). 4. Hold this position for __________ seconds. 5. Slowly return to the starting position. Let your muscles relax completely before  doing another repetition. Repeat __________ times. Complete this exercise __________ times a day. Exercise D: Straight leg raises (hip extensors) 5. Lie on your abdomen on your bed or a firm surface. You can put a pillow under your hips if that is more comfortable. 6. Bend your left / right knee so your foot is straight up in the air. 7. Squeeze your buttock muscles and lift your left / right thigh off the bed. Do not let your back arch. 8. Tense this muscle as hard as you can without increasing any knee pain. 9. Hold this position for __________ seconds. 10. Slowly lower your leg to the starting position and allow it to relax completely. Repeat __________ times. Complete this exercise __________ times a day. Exercise E: Hip hike 4. Stand sideways on a bottom step. Stand on your left / right leg with your other foot unsupported next to the step. You can hold onto the railing or wall if needed for balance. 5. Keep your knees straight and your torso square. Then, lift your left / right hip up toward the ceiling. 6. Slowly let your left / right hip lower toward the floor, past the starting position. Your foot should get closer to the floor. Do not lean or bend your knees. Repeat __________ times. Complete this exercise __________ times a day. This information is not intended to replace advice given to you by your health care provider. Make sure you discuss any questions you have with your health care provider. Document Released: 02/27/2005 Document Revised: 11/02/2015 Document Reviewed: 01/29/2015 Elsevier Interactive Patient Education  2019 [employer].

## 2018-08-26 ENCOUNTER — Other Ambulatory Visit: Payer: Self-pay | Admitting: *Deleted

## 2018-08-26 MED ORDER — ZOLPIDEM TARTRATE 10 MG PO TABS: ORAL_TABLET | ORAL | 1 refills | Status: DC

## 2018-08-26 NOTE — Telephone Encounter (Signed)
[place] Controlled Database Checked Last filled: 06/27/18 # 90 LOV w/you: 02/21/18 Next appt w/you: 08/28/18

## 2018-08-26 NOTE — Telephone Encounter (Signed)
Done erx 

## 2018-08-28 ENCOUNTER — Ambulatory Visit: Payer: Medicare HMO | Admitting: Internal Medicine

## 2018-08-30 ENCOUNTER — Ambulatory Visit (INDEPENDENT_AMBULATORY_CARE_PROVIDER_SITE_OTHER): Payer: Medicare HMO | Admitting: Internal Medicine

## 2018-08-30 ENCOUNTER — Other Ambulatory Visit: Payer: Self-pay

## 2018-08-30 DIAGNOSIS — I10 Essential (primary) hypertension: Secondary | ICD-10-CM

## 2018-08-30 DIAGNOSIS — R739 Hyperglycemia, unspecified: Secondary | ICD-10-CM

## 2018-08-30 DIAGNOSIS — F329 Major depressive disorder, single episode, unspecified: Secondary | ICD-10-CM | POA: Diagnosis not present

## 2018-08-30 DIAGNOSIS — F32A Depression, unspecified: Secondary | ICD-10-CM

## 2018-08-30 MED ORDER — CITALOPRAM HYDROBROMIDE 10 MG PO TABS: 10.0000 mg | ORAL_TABLET | Freq: Every day | ORAL | 3 refills | Status: DC

## 2018-08-30 NOTE — Progress Notes (Deleted)
Cumulative time during 7-day interval {}, there was not an associated office visit for this concern within a 7 day period. Verbal consent for services obtained from patient prior to services given. Names of all persons present for services: Cathlean Cower, MD, {} Chief complaint: {} History, background, results pertinent:  Past Medical History:  Diagnosis Date  . ALLERGIC RHINITIS 12/18/2006   Qualifier: Diagnosis of  By: [place], [place]    . Anxiety   . Arthritis   . DEPRESSION, SITUATIONAL 03/15/2007   Qualifier: Diagnosis of  By: Wynona Luna   . GERD (gastroesophageal reflux disease)   . Headache    occasional  . History of kidney stones   . HYPERLIPIDEMIA 12/18/2006   Qualifier: History of  By: Danny Lawless CMA, [place]    . Hypertension   . HYPERTENSION 12/18/2006   Qualifier: Diagnosis of  By: Danny Lawless CMA, [place]    . INSOMNIA-SLEEP DISORDER-UNSPEC 08/25/2009   Qualifier: Diagnosis of  By: Jenny Reichmann MD, Hunt Oris   . Pain in knee region after total knee replacement ([place]) 04/03/2012  . Pneumonia   . RENAL CALCULUS, HX OF 12/18/2006   Qualifier: Diagnosis of  By: Danny Lawless CMA, [place]     No results found for this or any previous visit (from the past 38 hour(s)). A/P/next steps: {}

## 2018-08-30 NOTE — Patient Instructions (Signed)
Please take all new medication as prescribed - the celexa at 10 mg per day  Please call in 3-4 weeks if you think the 20 mg might be better  Please continue all other medications as before, and refills have been done if requested.  Please have the pharmacy call with any other refills you may need.  Please continue your efforts at being more active, low cholesterol diet, and weight control.  Please keep your appointments with your specialists as you may have planned  Please return in 3 months, or sooner if needed

## 2018-08-31 ENCOUNTER — Encounter: Payer: Self-pay | Admitting: Internal Medicine

## 2018-08-31 NOTE — Assessment & Plan Note (Signed)
See notes

## 2018-08-31 NOTE — Progress Notes (Signed)
Patient ID: Pamela [name], female   DOB: 1955-04-23, 63 y.o.   MRN: [id number]  . Cumulative time during 7-day interval 14 min, there was not an associated office visit for this concern within a 7 day period.  Verbal consent for services obtained from patient prior to services given.  Names of all persons present for services: Cathlean Cower, MD, patient  Chief complaint: general medical f/u and depresion  History, background, results pertinent: Here to f/u; overall doing ok,  Pt denies chest pain, increasing sob or doe, wheezing, orthopnea, PND, increased LE swelling, palpitations, dizziness or syncope.  Pt denies new neurological symptoms such as new headache, or facial or extremity weakness or numbness.  Pt denies polydipsia, polyuria,.  Pt states overall good compliance with meds, mostly trying to follow appropriate diet, with wt overall stable,  but little exercise however.  Also c/o 1-2 mo mild to mod worsening depressive symptoms, without suicidal ideation, or panic; has ongoing anxiety, not increased recently.   Past Medical History:  Diagnosis Date  . ALLERGIC RHINITIS 12/18/2006   Qualifier: Diagnosis of  By: [place], [place]    . Anxiety   . Arthritis   . DEPRESSION, SITUATIONAL 03/15/2007   Qualifier: Diagnosis of  By: Wynona Luna   . GERD (gastroesophageal reflux disease)   . Headache    occasional  . History of kidney stones   . HYPERLIPIDEMIA 12/18/2006   Qualifier: History of  By: Danny Lawless CMA, [place]    . Hypertension   . HYPERTENSION 12/18/2006   Qualifier: Diagnosis of  By: Danny Lawless CMA, [place]    . INSOMNIA-SLEEP DISORDER-UNSPEC 08/25/2009   Qualifier: Diagnosis of  By: Jenny Reichmann MD, Hunt Oris   . Pain in knee region after total knee replacement ([place]) 04/03/2012  . Pneumonia   . RENAL CALCULUS, HX OF 12/18/2006   Qualifier: Diagnosis of  By: Danny Lawless CMA, [place]     No results found for this or any previous visit (from the past 76 hour(s)).   A/P/next steps:   Depression  - for celexa 10 mg per day, declines counseling or psychiatric referral  Hyperglycemia - stable, for a1c with labs  HTN  - encourage to check BP at on regular basis with goal < 140/90, cont same tx  Cathlean Cower MD

## 2018-09-03 ENCOUNTER — Ambulatory Visit: Payer: Self-pay | Admitting: Rheumatology

## 2018-09-06 ENCOUNTER — Other Ambulatory Visit: Payer: Self-pay | Admitting: *Deleted

## 2018-09-06 ENCOUNTER — Telehealth: Payer: Self-pay | Admitting: Rheumatology

## 2018-09-06 DIAGNOSIS — Z79899 Other long term (current) drug therapy: Secondary | ICD-10-CM | POA: Diagnosis not present

## 2018-09-06 NOTE — Telephone Encounter (Signed)
Patient advised she is due to have labs now. Patient advised of lab hours and states she will come today.

## 2018-09-06 NOTE — Telephone Encounter (Signed)
Patient called requesting a return call to let her know when she is due for labwork.   

## 2018-09-07 LAB — COMPLETE METABOLIC PANEL WITH GFR
AG Ratio: 1.4 (calc) (ref 1.0–2.5)
ALT: 14 U/L (ref 6–29)
AST: 16 U/L (ref 10–35)
Albumin: 4.3 g/dL (ref 3.6–5.1)
Alkaline phosphatase (APISO): 70 U/L (ref 37–153)
BUN: 16 mg/dL (ref 7–25)
CO2: 26 mmol/L (ref 20–32)
Calcium: 10 mg/dL (ref 8.6–10.4)
Chloride: 103 mmol/L (ref 98–110)
Creat: 0.85 mg/dL (ref 0.50–0.99)
GFR, Est African American: 84 mL/min/{1.73_m2} (ref >=60)
GFR, Est Non African American: 72 mL/min/{1.73_m2} (ref >=60)
Globulin: 3 g/dL (calc) (ref 1.9–3.7)
Glucose, Bld: 93 mg/dL (ref 65–99)
Potassium: 3.9 mmol/L (ref 3.5–5.3)
Sodium: 139 mmol/L (ref 135–146)
Total Bilirubin: 0.6 mg/dL (ref 0.2–1.2)
Total Protein: 7.3 g/dL (ref 6.1–8.1)

## 2018-09-07 LAB — CBC WITH DIFFERENTIAL/PLATELET
Absolute Monocytes: 708 cells/uL (ref 200–950)
Basophils Absolute: 62 cells/uL (ref 0–200)
Basophils Relative: 0.8 %
Eosinophils Absolute: 177 cells/uL (ref 15–500)
Eosinophils Relative: 2.3 %
HCT: 36.7 % (ref 35.0–45.0)
Hemoglobin: 12.4 g/dL (ref 11.7–15.5)
Lymphs Abs: 1378 cells/uL (ref 850–3900)
MCH: 27.5 pg (ref 27.0–33.0)
MCHC: 33.8 g/dL (ref 32.0–36.0)
MCV: 81.4 fL (ref 80.0–100.0)
MPV: 10.4 fL (ref 7.5–12.5)
Monocytes Relative: 9.2 %
Neutro Abs: 5375 cells/uL (ref 1500–7800)
Neutrophils Relative %: 69.8 %
Platelets: 309 10*3/uL (ref 140–400)
RBC: 4.51 10*6/uL (ref 3.80–5.10)
RDW: 13.6 % (ref 11.0–15.0)
Total Lymphocyte: 17.9 %
WBC: 7.7 10*3/uL (ref 3.8–10.8)

## 2018-09-09 NOTE — Progress Notes (Signed)
CBC and CMP WNL

## 2018-10-03 ENCOUNTER — Telehealth: Payer: Self-pay | Admitting: Rheumatology

## 2018-10-03 NOTE — Telephone Encounter (Signed)
Patient due for next labs November 06, 2018. Patient advised.

## 2018-10-03 NOTE — Telephone Encounter (Signed)
Patient [facility] requesting a call back to advise her when she is due for labs.

## 2018-10-07 ENCOUNTER — Other Ambulatory Visit: Payer: Self-pay | Admitting: Internal Medicine

## 2018-10-12 ENCOUNTER — Other Ambulatory Visit: Payer: Self-pay | Admitting: Rheumatology

## 2018-10-14 NOTE — Telephone Encounter (Signed)
Last Visit: 08/22/2018 Next Visit: 11/22/2018 Labs: 09/06/2018 WNL   Okay to refill per Dr. Estanislado Pandy.

## 2018-10-15 ENCOUNTER — Other Ambulatory Visit: Payer: Self-pay | Admitting: Rheumatology

## 2018-11-08 NOTE — Progress Notes (Deleted)
Office Visit Note  Patient: Pamela [name]             Date of Birth: [birth year]           MRN: [id number]             PCP: Biagio Borg, MD Referring: Biagio Borg, MD Visit Date: 11/22/2018 Occupation: @GUAROCC @  Subjective:  No chief complaint on file.   History of Present Illness: Pamela [name] is a 63 y.o. female ***   Activities of Daily Living:  Patient reports morning stiffness for *** {minute/hour:19697}.   Patient {ACTIONS;DENIES/REPORTS:21021675::"Denies"} nocturnal pain.  Difficulty dressing/grooming: {ACTIONS;DENIES/REPORTS:21021675::"Denies"} Difficulty climbing stairs: {ACTIONS;DENIES/REPORTS:21021675::"Denies"} Difficulty getting out of chair: {ACTIONS;DENIES/REPORTS:21021675::"Denies"} Difficulty using hands for taps, buttons, cutlery, and/or writing: {ACTIONS;DENIES/REPORTS:21021675::"Denies"}  No Rheumatology ROS completed.   PMFS History:  Patient Active Problem List   Diagnosis Date Noted  . MGUS (monoclonal gammopathy of unknown significance) 05/11/2018  . Rheumatoid arthritis involving multiple sites with positive rheumatoid factor ([place]) 05/03/2018  . High risk medication use 05/03/2018  . Status post total left knee replacement 05/03/2018  . DDD (degenerative disc disease), lumbar 05/03/2018  . Primary osteoarthritis of right knee 04/05/2018  . 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 Left rotator cuff tear 12/19/2017  . Left wrist pain 12/12/2017  . Left shoulder pain 08/22/2017  . Chronic pain of right knee 08/22/2017  . Acute pain of right knee 04/17/2017  . Pain of left calf 04/13/2017  . 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 Hyperglycemia 09/23/2015  . Anxiety state 09/23/2015  . Abnormal urine odor 08/25/2015  . GERD (gastroesophageal reflux disease) 08/25/2015  . Upper abdominal pain 04/16/2015  . Early satiety 04/16/2015  . Dysuria 04/16/2015  . Encounter for well adult exam with abnormal findings 06/18/2014  . Greater trochanteric  bursitis of right hip 01/06/2013  . Left lumbar radiculitis 04/03/2012  . Eczema 04/03/2012  . 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 BURSITIS, RIGHT HIP 08/25/2009  . INSOMNIA-SLEEP DISORDER-UNSPEC 08/25/2009  . FATIGUE 08/25/2009  . Cervicalgia 08/14/2007  . Depression 03/15/2007  . HLD (hyperlipidemia) 12/18/2006  . Essential hypertension 12/18/2006  . ALLERGIC RHINITIS 12/18/2006  . RENAL CALCULUS, HX OF 12/18/2006    Past Medical History:  Diagnosis Date  . ALLERGIC RHINITIS 12/18/2006   Qualifier: Diagnosis of  By: [place], [place]    . Anxiety   . Arthritis   . DEPRESSION, SITUATIONAL 03/15/2007   Qualifier: Diagnosis of  By: Wynona Luna   . GERD (gastroesophageal reflux disease)   . Headache    occasional  . History of kidney stones   . HYPERLIPIDEMIA 12/18/2006   Qualifier: History of  By: Danny Lawless CMA, [place]    . Hypertension   . HYPERTENSION 12/18/2006   Qualifier: Diagnosis of  By: Danny Lawless CMA, [place]    . INSOMNIA-SLEEP DISORDER-UNSPEC 08/25/2009   Qualifier: Diagnosis of  By: Jenny Reichmann MD, Hunt Oris   . Pain in knee region after total knee replacement ([place]) 04/03/2012  . Pneumonia   . RENAL CALCULUS, HX OF 12/18/2006   Qualifier: Diagnosis of  By: Danny Lawless CMA, [place]      Family History  Problem Relation Age of Onset  . Stroke Mother   . Heart disease Father   . Lung cancer Sister   . Lung cancer Brother   . Healthy Daughter   . Healthy Daughter   . Healthy Daughter    Past Surgical History:  Procedure Laterality Date  . CERVICAL DISC  SURGERY     x 3  . CONVERSION TO TOTAL KNEE Left 10/09/2016   Procedure: Conversion left uni compartment arthroplasty to total knee arthroplasty;  Surgeon: Paralee Cancel, MD;  Location: [facility];  Service: Orthopedics;  Laterality: Left;  90 mins  . KNEE ARTHROSCOPY     partial  . total left knee arthoplasty     10/09/16 Dr. Alvan Dame   Social History   Social History Narrative  . Not on file   Immunization History  Administered  Date(s) Administered  . Influenza Whole 11/12/2012  . Influenza,inj,Quad PF,6+ Mos 12/11/2017  . Influenza-Unspecified 12/15/2013, 12/12/2015, 11/29/2016  . Td 11/03/2002  . Tdap 09/23/2015  . Zoster 08/12/2012  . Zoster Recombinat (Shingrix) 03/22/2018, 08/20/2018     Objective: Vital Signs: There were no vitals taken for this visit.   Physical Exam   Musculoskeletal Exam: ***  CDAI Exam: CDAI Score: - Patient Global: -; Provider Global: - Swollen: -; Tender: - Joint Exam   No joint exam has been documented for this visit   There is currently no information documented on the homunculus. Go to the Rheumatology activity and complete the homunculus joint exam.  Investigation: No additional findings.  Imaging: No results found.  Recent Labs: Lab Results  Component Value Date   WBC 7.7 09/06/2018   HGB 12.4 09/06/2018   PLT 309 09/06/2018   NA 139 09/06/2018   K 3.9 09/06/2018   CL 103 09/06/2018   CO2 26 09/06/2018   GLUCOSE 93 09/06/2018   BUN 16 09/06/2018   CREATININE 0.85 09/06/2018   BILITOT 0.6 09/06/2018   ALKPHOS 55 05/11/2018   AST 16 09/06/2018   ALT 14 09/06/2018   PROT 7.3 09/06/2018   ALBUMIN 4.0 05/11/2018   CALCIUM 10.0 09/06/2018   GFRAA 84 09/06/2018   QFTBGOLDPLUS NEGATIVE 04/26/2018    Speciality Comments: No specialty comments available.  Procedures:  No procedures performed Allergies: Patient has no known allergies.   Assessment / Plan:     Visit Diagnoses: No diagnosis found.  Orders: No orders of the defined types were placed in this encounter.  No orders of the defined types were placed in this encounter.   Face-to-face time spent with patient was *** minutes. Greater than 50% of time was spent in counseling and coordination of care.  Follow-Up Instructions: No follow-ups on file.   Ofilia Neas, PA-C  Note - This record has been created using Dragon software.  Chart creation errors have been sought, but may not  always  have been located. Such creation errors do not reflect on  the standard of medical care.

## 2018-11-11 ENCOUNTER — Other Ambulatory Visit: Payer: Self-pay | Admitting: Gastroenterology

## 2018-11-15 NOTE — Progress Notes (Deleted)
Office Visit Note  Patient: Pamela [name]             Date of Birth: [birth year]           MRN: [id number]             PCP: Biagio Borg, MD Referring: Biagio Borg, MD Visit Date: 11/25/2018 Occupation: @GUAROCC @  Subjective:  No chief complaint on file.   History of Present Illness: Pamela [name] is a 63 y.o. female ***   Activities of Daily Living:  Patient reports morning stiffness for *** {minute/hour:19697}.   Patient {ACTIONS;DENIES/REPORTS:21021675::"Denies"} nocturnal pain.  Difficulty dressing/grooming: {ACTIONS;DENIES/REPORTS:21021675::"Denies"} Difficulty climbing stairs: {ACTIONS;DENIES/REPORTS:21021675::"Denies"} Difficulty getting out of chair: {ACTIONS;DENIES/REPORTS:21021675::"Denies"} Difficulty using hands for taps, buttons, cutlery, and/or writing: {ACTIONS;DENIES/REPORTS:21021675::"Denies"}  No Rheumatology ROS completed.   PMFS History:  Patient Active Problem List   Diagnosis Date Noted  . MGUS (monoclonal gammopathy of unknown significance) 05/11/2018  . Rheumatoid arthritis involving multiple sites with positive rheumatoid factor ([name]) 05/03/2018  . High risk medication use 05/03/2018  . Status post total left knee replacement 05/03/2018  . DDD (degenerative disc disease), lumbar 05/03/2018  . Primary osteoarthritis of right knee 04/05/2018  . 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 Left rotator cuff tear 12/19/2017  . Left wrist pain 12/12/2017  . Left shoulder pain 08/22/2017  . Chronic pain of right knee 08/22/2017  . Acute pain of right knee 04/17/2017  . Pain of left calf 04/13/2017  . 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 Hyperglycemia 09/23/2015  . Anxiety state 09/23/2015  . Abnormal urine odor 08/25/2015  . GERD (gastroesophageal reflux disease) 08/25/2015  . Upper abdominal pain 04/16/2015  . Early satiety 04/16/2015  . Dysuria 04/16/2015  . Encounter for well adult exam with abnormal findings 06/18/2014  . Greater trochanteric  bursitis of right hip 01/06/2013  . Left lumbar radiculitis 04/03/2012  . Eczema 04/03/2012  . 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 BURSITIS, RIGHT HIP 08/25/2009  . INSOMNIA-SLEEP DISORDER-UNSPEC 08/25/2009  . FATIGUE 08/25/2009  . Cervicalgia 08/14/2007  . Depression 03/15/2007  . HLD (hyperlipidemia) 12/18/2006  . Essential hypertension 12/18/2006  . ALLERGIC RHINITIS 12/18/2006  . RENAL CALCULUS, HX OF 12/18/2006    Past Medical History:  Diagnosis Date  . ALLERGIC RHINITIS 12/18/2006   Qualifier: Diagnosis of  By: [place], [place]    . Anxiety   . Arthritis   . DEPRESSION, SITUATIONAL 03/15/2007   Qualifier: Diagnosis of  By: Wynona Luna   . GERD (gastroesophageal reflux disease)   . Headache    occasional  . History of kidney stones   . HYPERLIPIDEMIA 12/18/2006   Qualifier: History of  By: Danny Lawless CMA, [place]    . Hypertension   . HYPERTENSION 12/18/2006   Qualifier: Diagnosis of  By: Danny Lawless CMA, [place]    . INSOMNIA-SLEEP DISORDER-UNSPEC 08/25/2009   Qualifier: Diagnosis of  By: Jenny Reichmann MD, Hunt Oris   . Pain in knee region after total knee replacement ([place]) 04/03/2012  . Pneumonia   . RENAL CALCULUS, HX OF 12/18/2006   Qualifier: Diagnosis of  By: Danny Lawless CMA, [place]      Family History  Problem Relation Age of Onset  . Stroke Mother   . Heart disease Father   . Lung cancer Sister   . Lung cancer Brother   . Healthy Daughter   . Healthy Daughter   . Healthy Daughter    Past Surgical History:  Procedure Laterality Date  . CERVICAL DISC  SURGERY     x 3  . CONVERSION TO TOTAL KNEE Left 10/09/2016   Procedure: Conversion left uni compartment arthroplasty to total knee arthroplasty;  Surgeon: Paralee Cancel, MD;  Location: [facility];  Service: Orthopedics;  Laterality: Left;  90 mins  . KNEE ARTHROSCOPY     partial  . total left knee arthoplasty     10/09/16 Dr. Alvan Dame   Social History   Social History Narrative  . Not on file   Immunization History  Administered  Date(s) Administered  . Influenza Whole 11/12/2012  . Influenza,inj,Quad PF,6+ Mos 12/11/2017  . Influenza-Unspecified 12/15/2013, 12/12/2015, 11/29/2016  . Td 11/03/2002  . Tdap 09/23/2015  . Zoster 08/12/2012  . Zoster Recombinat (Shingrix) 03/22/2018, 08/20/2018     Objective: Vital Signs: There were no vitals taken for this visit.   Physical Exam   Musculoskeletal Exam: ***  CDAI Exam: CDAI Score: - Patient Global: -; Provider Global: - Swollen: -; Tender: - Joint Exam   No joint exam has been documented for this visit   There is currently no information documented on the homunculus. Go to the Rheumatology activity and complete the homunculus joint exam.  Investigation: No additional findings.  Imaging: No results found.  Recent Labs: Lab Results  Component Value Date   WBC 7.7 09/06/2018   HGB 12.4 09/06/2018   PLT 309 09/06/2018   NA 139 09/06/2018   K 3.9 09/06/2018   CL 103 09/06/2018   CO2 26 09/06/2018   GLUCOSE 93 09/06/2018   BUN 16 09/06/2018   CREATININE 0.85 09/06/2018   BILITOT 0.6 09/06/2018   ALKPHOS 55 05/11/2018   AST 16 09/06/2018   ALT 14 09/06/2018   PROT 7.3 09/06/2018   ALBUMIN 4.0 05/11/2018   CALCIUM 10.0 09/06/2018   GFRAA 84 09/06/2018   QFTBGOLDPLUS NEGATIVE 04/26/2018    Speciality Comments: No specialty comments available.  Procedures:  No procedures performed Allergies: Patient has no known allergies.   Assessment / Plan:     Visit Diagnoses: No diagnosis found.  Orders: No orders of the defined types were placed in this encounter.  No orders of the defined types were placed in this encounter.   Face-to-face time spent with patient was *** minutes. Greater than 50% of time was spent in counseling and coordination of care.  Follow-Up Instructions: No follow-ups on file.   Ofilia Neas, PA-C  Note - This record has been created using Dragon software.  Chart creation errors have been sought, but may not  always  have been located. Such creation errors do not reflect on  the standard of medical care.

## 2018-11-19 ENCOUNTER — Inpatient Hospital Stay: Payer: Medicare HMO | Attending: Internal Medicine

## 2018-11-19 DIAGNOSIS — D472 Monoclonal gammopathy: Secondary | ICD-10-CM | POA: Insufficient documentation

## 2018-11-19 DIAGNOSIS — I10 Essential (primary) hypertension: Secondary | ICD-10-CM | POA: Insufficient documentation

## 2018-11-19 DIAGNOSIS — R5383 Other fatigue: Secondary | ICD-10-CM | POA: Insufficient documentation

## 2018-11-19 DIAGNOSIS — Z79899 Other long term (current) drug therapy: Secondary | ICD-10-CM | POA: Insufficient documentation

## 2018-11-19 DIAGNOSIS — F418 Other specified anxiety disorders: Secondary | ICD-10-CM | POA: Insufficient documentation

## 2018-11-19 DIAGNOSIS — Z87442 Personal history of urinary calculi: Secondary | ICD-10-CM | POA: Insufficient documentation

## 2018-11-19 DIAGNOSIS — G4700 Insomnia, unspecified: Secondary | ICD-10-CM | POA: Insufficient documentation

## 2018-11-19 DIAGNOSIS — K219 Gastro-esophageal reflux disease without esophagitis: Secondary | ICD-10-CM | POA: Insufficient documentation

## 2018-11-19 DIAGNOSIS — E785 Hyperlipidemia, unspecified: Secondary | ICD-10-CM | POA: Insufficient documentation

## 2018-11-20 ENCOUNTER — Other Ambulatory Visit: Payer: Self-pay | Admitting: Gastroenterology

## 2018-11-21 ENCOUNTER — Inpatient Hospital Stay: Payer: Medicare HMO | Admitting: Internal Medicine

## 2018-11-22 ENCOUNTER — Ambulatory Visit: Payer: Medicare HMO | Admitting: Rheumatology

## 2018-11-22 ENCOUNTER — Ambulatory Visit: Payer: Medicare HMO | Admitting: Physician Assistant

## 2018-11-25 ENCOUNTER — Telehealth: Payer: Self-pay | Admitting: Internal Medicine

## 2018-11-25 ENCOUNTER — Ambulatory Visit: Payer: Medicare HMO | Admitting: Physician Assistant

## 2018-11-25 NOTE — Telephone Encounter (Signed)
R/s appt per 9/10 sch message - pt aware of appt date and time

## 2018-11-27 ENCOUNTER — Inpatient Hospital Stay: Payer: Medicare HMO

## 2018-11-27 ENCOUNTER — Inpatient Hospital Stay: Payer: Medicare HMO | Admitting: Internal Medicine

## 2018-11-27 ENCOUNTER — Telehealth: Payer: Self-pay | Admitting: Internal Medicine

## 2018-11-27 NOTE — Telephone Encounter (Signed)
Called pt per 9/16 sch message- no answer . Left message for pt call back to reschedule.

## 2018-11-28 ENCOUNTER — Telehealth: Payer: Self-pay | Admitting: Internal Medicine

## 2018-11-28 ENCOUNTER — Other Ambulatory Visit: Payer: Self-pay

## 2018-11-28 ENCOUNTER — Encounter: Payer: Self-pay | Admitting: Internal Medicine

## 2018-11-28 ENCOUNTER — Inpatient Hospital Stay: Payer: Medicare HMO

## 2018-11-28 ENCOUNTER — Inpatient Hospital Stay (HOSPITAL_BASED_OUTPATIENT_CLINIC_OR_DEPARTMENT_OTHER): Payer: Medicare HMO | Admitting: Internal Medicine

## 2018-11-28 VITALS — BP 132/91 | HR 113 | Temp 97.9°F | Resp 17 | Ht 68.5 in | Wt 238.1 lb

## 2018-11-28 DIAGNOSIS — I10 Essential (primary) hypertension: Secondary | ICD-10-CM | POA: Diagnosis not present

## 2018-11-28 DIAGNOSIS — D472 Monoclonal gammopathy: Secondary | ICD-10-CM

## 2018-11-28 DIAGNOSIS — R5383 Other fatigue: Secondary | ICD-10-CM | POA: Diagnosis not present

## 2018-11-28 DIAGNOSIS — K219 Gastro-esophageal reflux disease without esophagitis: Secondary | ICD-10-CM | POA: Diagnosis not present

## 2018-11-28 DIAGNOSIS — Z79899 Other long term (current) drug therapy: Secondary | ICD-10-CM | POA: Diagnosis not present

## 2018-11-28 DIAGNOSIS — G4700 Insomnia, unspecified: Secondary | ICD-10-CM | POA: Diagnosis not present

## 2018-11-28 DIAGNOSIS — F418 Other specified anxiety disorders: Secondary | ICD-10-CM | POA: Diagnosis not present

## 2018-11-28 DIAGNOSIS — Z87442 Personal history of urinary calculi: Secondary | ICD-10-CM | POA: Diagnosis not present

## 2018-11-28 DIAGNOSIS — E785 Hyperlipidemia, unspecified: Secondary | ICD-10-CM | POA: Diagnosis not present

## 2018-11-28 LAB — CBC WITH DIFFERENTIAL (CANCER CENTER ONLY)
Abs Immature Granulocytes: 0.03 10*3/uL (ref 0.00–0.07)
Basophils Absolute: 0 10*3/uL (ref 0.0–0.1)
Basophils Relative: 1 %
Eosinophils Absolute: 0.3 10*3/uL (ref 0.0–0.5)
Eosinophils Relative: 4 %
HCT: 34.9 % — ABNORMAL LOW (ref 36.0–46.0)
Hemoglobin: 11.7 g/dL — ABNORMAL LOW (ref 12.0–15.0)
Immature Granulocytes: 0 %
Lymphocytes Relative: 15 %
Lymphs Abs: 1.1 10*3/uL (ref 0.7–4.0)
MCH: 27.4 pg (ref 26.0–34.0)
MCHC: 33.5 g/dL (ref 30.0–36.0)
MCV: 81.7 fL (ref 80.0–100.0)
Monocytes Absolute: 0.9 10*3/uL (ref 0.1–1.0)
Monocytes Relative: 11 %
Neutro Abs: 5.3 10*3/uL (ref 1.7–7.7)
Neutrophils Relative %: 69 %
Platelet Count: 281 10*3/uL (ref 150–400)
RBC: 4.27 MIL/uL (ref 3.87–5.11)
RDW: 12.5 % (ref 11.5–15.5)
WBC Count: 7.6 10*3/uL (ref 4.0–10.5)
nRBC: 0 % (ref 0.0–0.2)

## 2018-11-28 LAB — CMP (CANCER CENTER ONLY)
ALT: 26 U/L (ref 0–44)
AST: 20 U/L (ref 15–41)
Albumin: 4.2 g/dL (ref 3.5–5.0)
Alkaline Phosphatase: 76 U/L (ref 38–126)
Anion gap: 9 (ref 5–15)
BUN: 21 mg/dL (ref 8–23)
CO2: 28 mmol/L (ref 22–32)
Calcium: 9.2 mg/dL (ref 8.9–10.3)
Chloride: 103 mmol/L (ref 98–111)
Creatinine: 1 mg/dL (ref 0.44–1.00)
GFR, Est AFR Am: >60 mL/min (ref >60)
GFR, Estimated: 59 mL/min — ABNORMAL LOW (ref >60)
Glucose, Bld: 95 mg/dL (ref 70–99)
Potassium: 3.8 mmol/L (ref 3.5–5.1)
Sodium: 140 mmol/L (ref 135–145)
Total Bilirubin: 0.3 mg/dL (ref 0.3–1.2)
Total Protein: 7.7 g/dL (ref 6.5–8.1)

## 2018-11-28 LAB — LACTATE DEHYDROGENASE: LDH: 214 U/L — ABNORMAL HIGH (ref 98–192)

## 2018-11-28 NOTE — Telephone Encounter (Signed)
Scheduled appt per 9/17 los- mailed letter with appt date and time - Year f/u

## 2018-11-28 NOTE — Progress Notes (Signed)
[facility] [phone]   [phone]  OFFICE PROGRESS NOTE  Biagio Borg, MD [address] [place] [address]  DIAGNOSIS: Monoclonal gammopathy of undetermined significance  PRIOR THERAPY: None  CURRENT THERAPY: Observation.  INTERVAL HISTORY: Pamela [name] 63 y.o. female returns to the clinic today for 6 months follow-up visit.  The patient is feeling fine today with no concerning complaints except for mild fatigue.  She was recently started on methotrexate by her rheumatologist.  She denied having any chest pain, shortness of breath, cough or hemoptysis.  She denied having any fever or chills.  She has no nausea, vomiting, diarrhea or constipation.  She denied having any headache or visual changes.  She had repeat myeloma panel performed earlier today and she is here for evaluation and discussion of her lab results.  MEDICAL HISTORY: Past Medical History:  Diagnosis Date  . ALLERGIC RHINITIS 12/18/2006   Qualifier: Diagnosis of  By: [place], [place]    . Anxiety   . Arthritis   . DEPRESSION, SITUATIONAL 03/15/2007   Qualifier: Diagnosis of  By: Wynona Luna   . GERD (gastroesophageal reflux disease)   . Headache    occasional  . History of kidney stones   . HYPERLIPIDEMIA 12/18/2006   Qualifier: History of  By: Danny Lawless CMA, [place]    . Hypertension   . HYPERTENSION 12/18/2006   Qualifier: Diagnosis of  By: Danny Lawless CMA, [place]    . INSOMNIA-SLEEP DISORDER-UNSPEC 08/25/2009   Qualifier: Diagnosis of  By: Jenny Reichmann MD, Hunt Oris   . Pain in knee region after total knee replacement ([place]) 04/03/2012  . Pneumonia   . RENAL CALCULUS, HX OF 12/18/2006   Qualifier: Diagnosis of  By: Danny Lawless CMA, [place]      ALLERGIES:  has No Known Allergies.  MEDICATIONS:  Current Outpatient Medications  Medication Sig Dispense Refill  . ALPRAZolam (XANAX) 0.25 MG tablet TAKE 1 TABLET BY MOUTH TWICE DAILY AS NEEDED 60 tablet 5  . atorvastatin  (LIPITOR) 20 MG tablet TAKE 1 TABLET BY MOUTH EVERY DAY 90 tablet 0  . Camphor-Menthol-Methyl Sal (TIGER BALM MUSCLE RUB EX) Apply 1 application topically daily as needed (pain).    . citalopram (CELEXA) 10 MG tablet Take 1 tablet (10 mg total) by mouth daily. 90 tablet 3  . diclofenac sodium (VOLTAREN) 1 % GEL Apply 2 g topically 4 (four) times daily. 1 Tube 2  . folic acid (FOLVITE) 1 MG tablet Take 2 tablets (2 mg total) by mouth daily. 180 tablet 3  . gabapentin (NEURONTIN) 300 MG capsule   0  . hydrochlorothiazide (MICROZIDE) 12.5 MG capsule Take 1 capsule (12.5 mg total) by mouth daily. 90 capsule 3  . losartan (COZAAR) 50 MG tablet Take 1 tablet (50 mg total) by mouth daily. 90 tablet 3  . meloxicam (MOBIC) 15 MG tablet TAKE 1 TABLET(15 MG) BY MOUTH DAILY 90 tablet 0  . methotrexate (RHEUMATREX) 2.5 MG tablet TAKE 8 TABLETS BY MOUTH ONCE A WEEK 96 tablet 0  . metoCLOPramide (REGLAN) 10 MG tablet Take 1 tablet (10 mg total) by mouth 2 (two) times daily. 60 tablet 11  . pantoprazole (PROTONIX) 40 MG tablet TAKE 1 TABLET(40 MG) BY MOUTH DAILY 30 tablet 2  . traMADol (ULTRAM) 50 MG tablet take 1 tablet by mouth every 6 hours if needed for pain 120 tablet 2  . zolpidem (AMBIEN) 10 MG tablet TAKE 1 TABLET BY  MOUTH AT BEDTIME FOR SLEEP 90 tablet 1   No current facility-administered medications for this visit.     SURGICAL HISTORY:  Past Surgical History:  Procedure Laterality Date  . CERVICAL DISC SURGERY     x 3  . CONVERSION TO TOTAL KNEE Left 10/09/2016   Procedure: Conversion left uni compartment arthroplasty to total knee arthroplasty;  Surgeon: Paralee Cancel, MD;  Location: [facility];  Service: Orthopedics;  Laterality: Left;  90 mins  . KNEE ARTHROSCOPY     partial  . total left knee arthoplasty     10/09/16 Dr. Alvan Dame    REVIEW OF SYSTEMS:  A comprehensive review of systems was negative except for: Constitutional: positive for fatigue   PHYSICAL EXAMINATION: General appearance:  alert, cooperative and no distress Head: Normocephalic, without obvious abnormality, atraumatic Neck: no adenopathy, no JVD, supple, symmetrical, trachea midline and thyroid not enlarged, symmetric, no tenderness/mass/nodules Lymph nodes: Cervical, supraclavicular, and axillary nodes normal. Resp: clear to auscultation bilaterally Back: symmetric, no curvature. ROM normal. No CVA tenderness. Cardio: regular rate and rhythm, S1, S2 normal, no murmur, click, rub or gallop GI: soft, non-tender; bowel sounds normal; no masses,  no organomegaly Extremities: extremities normal, atraumatic, no cyanosis or edema  ECOG PERFORMANCE STATUS: 1 - Symptomatic but completely ambulatory  Blood pressure (!) 132/91, pulse (!) 113, temperature 97.9 F (36.6 C), temperature source Temporal, resp. rate 17, height 5' 8.5" (1.74 m), weight 238 lb 1.6 oz (108 kg), SpO2 100 %.  LABORATORY DATA: Lab Results  Component Value Date   WBC 7.6 11/28/2018   HGB 11.7 (L) 11/28/2018   HCT 34.9 (L) 11/28/2018   MCV 81.7 11/28/2018   PLT 281 11/28/2018      Chemistry      Component Value Date/Time   NA 139 09/06/2018 1210   K 3.9 09/06/2018 1210   CL 103 09/06/2018 1210   CO2 26 09/06/2018 1210   BUN 16 09/06/2018 1210   CREATININE 0.85 09/06/2018 1210      Component Value Date/Time   CALCIUM 10.0 09/06/2018 1210   ALKPHOS 55 05/11/2018 0807   AST 16 09/06/2018 1210   ALT 14 09/06/2018 1210   BILITOT 0.6 09/06/2018 1210       RADIOGRAPHIC STUDIES: No results found.  ASSESSMENT AND PLAN: This is a very pleasant 63 years old African-American female with very mild IgA monoclonal gammopathy. The patient is doing fine today with no concerning complaints.  Repeat CBC today is unremarkable except for mild anemia.  The remaining myeloma panel are still pending. I recommended for the patient to continue on observation with repeat myeloma panel in 1 year unless the pending results showed any concerning  abnormalities I will call the patient with further recommendation and follow-up sooner. She was advised to call immediately if she has any other concerning symptoms in the interval. The patient voices understanding of current disease status and treatment options and is in agreement with the current care plan. All questions were answered. The patient knows to call the clinic with any problems, questions or concerns. We can certainly see the patient much sooner if necessary.  I spent 10 minutes counseling the patient face to face. The total time spent in the appointment was 15 minutes.  Disclaimer: This note was dictated with voice recognition software. Similar sounding words can inadvertently be transcribed and may not be corrected upon review.

## 2018-11-29 LAB — KAPPA/LAMBDA LIGHT CHAINS
Kappa free light chain: 17.3 mg/L (ref 3.3–19.4)
Kappa, lambda light chain ratio: 0.78 (ref 0.26–1.65)
Lambda free light chains: 22.3 mg/L (ref 5.7–26.3)

## 2018-11-29 LAB — BETA 2 MICROGLOBULIN, SERUM: Beta-2 Microglobulin: 1.7 mg/L (ref 0.6–2.4)

## 2018-11-29 LAB — IGG, IGA, IGM
IgA: 330 mg/dL (ref 87–352)
IgG (Immunoglobin G), Serum: 1345 mg/dL (ref 586–1602)
IgM (Immunoglobulin M), Srm: 73 mg/dL (ref 26–217)

## 2018-12-02 ENCOUNTER — Telehealth: Payer: Self-pay | Admitting: Internal Medicine

## 2018-12-02 MED ORDER — ALPRAZOLAM 0.25 MG PO TABS: ORAL_TABLET | ORAL | 5 refills | Status: DC

## 2018-12-02 NOTE — Telephone Encounter (Signed)
Medication Refill: ALPRAZolam (XANAX) 0.25 MG tablet [id number]    Pharmacy:  [facility] [place], [place] - [address] AT [address] [phone] (Phone) [phone] (Fax)    Pt aware of turn around time

## 2018-12-02 NOTE — Progress Notes (Signed)
Office Visit Note  Patient: Pamela [name]             Date of Birth: 10/05/1955           MRN: [id number]             PCP: Biagio Borg, MD Referring: Biagio Borg, MD Visit Date: 12/16/2018 Occupation: @GUAROCC @  Subjective:  Medication monitoring    History of Present Illness: Pamela [name] is a 63 y.o. female with history of seropositive rheumatoid arthritis, osteoarthritis, and DDD.  Patient is on methotrexate 8 tablets by mouth once weekly and folic acid 2 mg by mouth daily.  She has been tolerating methotrexate without any side effects.  She started on methotrexate in April 2020 as noted significant improvement.  She denies any recent rheumatoid arthritis flares.  She continues to have some inflammation in the left wrist but does not have any tenderness or discomfort at this time.  She states her morning stiffness has been lasting about 1 hour.  She denies any other joint pain or joint swelling at this time.  She states the left knee replacement is doing well.  She has ongoing trochanteric bursitis of the left hip. She has been performing stretching exercises regularly. She states that her left shoulder has good range of motion with no discomfort at this time.   Activities of Daily Living:  Patient reports morning stiffness for  1 hour.   Patient Denies nocturnal pain.  Difficulty dressing/grooming: Denies Difficulty climbing stairs: Reports Difficulty getting out of chair: Reports Difficulty using hands for taps, buttons, cutlery, and/or writing: Denies  Review of Systems  Constitutional: Negative for fatigue.  HENT: Negative for mouth sores, mouth dryness and nose dryness.   Eyes: Negative for pain, visual disturbance and dryness.  Respiratory: Negative for cough, hemoptysis, shortness of breath and difficulty breathing.   Cardiovascular: Negative for chest pain, palpitations, hypertension and swelling in legs/feet.  Gastrointestinal: Negative for blood in stool,  constipation and diarrhea.  Endocrine: Negative for increased urination.  Genitourinary: Negative for painful urination.  Musculoskeletal: Negative for arthralgias, joint pain, joint swelling, myalgias, muscle weakness, morning stiffness, muscle tenderness and myalgias.  Skin: Negative for color change, pallor, rash, hair loss, nodules/bumps, skin tightness, ulcers and sensitivity to sunlight.  Allergic/Immunologic: Negative for susceptible to infections.  Neurological: Negative for dizziness, numbness, headaches and weakness.  Hematological: Negative for swollen glands.  Psychiatric/Behavioral: Negative for depressed mood and sleep disturbance. The patient is not nervous/anxious.     PMFS History:  Patient Active Problem List   Diagnosis Date Noted  . MGUS (monoclonal gammopathy of unknown significance) 05/11/2018  . Rheumatoid arthritis involving multiple sites with positive rheumatoid factor ([place]) 05/03/2018  . High risk medication use 05/03/2018  . Status post total left knee replacement 05/03/2018  . DDD (degenerative disc disease), lumbar 05/03/2018  . Primary osteoarthritis of right knee 04/05/2018  . 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 Left rotator cuff tear 12/19/2017  . Left wrist pain 12/12/2017  . Left shoulder pain 08/22/2017  . Chronic pain of right knee 08/22/2017  . Acute pain of right knee 04/17/2017  . Pain of left calf 04/13/2017  . 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 Hyperglycemia 09/23/2015  . Anxiety state 09/23/2015  . Abnormal urine odor 08/25/2015  . GERD (gastroesophageal reflux disease) 08/25/2015  . Upper abdominal pain 04/16/2015  . Early satiety 04/16/2015  . Dysuria 04/16/2015  . Encounter for well adult exam with abnormal  findings 06/18/2014  . Greater trochanteric bursitis of right hip 01/06/2013  . Left lumbar radiculitis 04/03/2012  . Eczema 04/03/2012  . 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 BURSITIS, RIGHT HIP 08/25/2009  . INSOMNIA-SLEEP  DISORDER-UNSPEC 08/25/2009  . FATIGUE 08/25/2009  . Cervicalgia 08/14/2007  . Depression 03/15/2007  . HLD (hyperlipidemia) 12/18/2006  . Essential hypertension 12/18/2006  . ALLERGIC RHINITIS 12/18/2006  . RENAL CALCULUS, HX OF 12/18/2006    Past Medical History:  Diagnosis Date  . ALLERGIC RHINITIS 12/18/2006   Qualifier: Diagnosis of  By: [place], [place]    . Anxiety   . Arthritis   . DEPRESSION, SITUATIONAL 03/15/2007   Qualifier: Diagnosis of  By: Wynona Luna   . GERD (gastroesophageal reflux disease)   . Headache    occasional  . History of kidney stones   . HYPERLIPIDEMIA 12/18/2006   Qualifier: History of  By: Danny Lawless CMA, [place]    . Hypertension   . HYPERTENSION 12/18/2006   Qualifier: Diagnosis of  By: Danny Lawless CMA, [place]    . INSOMNIA-SLEEP DISORDER-UNSPEC 08/25/2009   Qualifier: Diagnosis of  By: Jenny Reichmann MD, [place] Oris   . Pain in knee region after total knee replacement ([place]) 04/03/2012  . Pneumonia   . RENAL CALCULUS, HX OF 12/18/2006   Qualifier: Diagnosis of  By: Danny Lawless CMA, [place]      Family History  Problem Relation Age of Onset  . Stroke Mother   . Heart disease Father   . Lung cancer Sister   . Lung cancer Brother   . Healthy Daughter   . Healthy Daughter   . Healthy Daughter    Past Surgical History:  Procedure Laterality Date  . CERVICAL DISC SURGERY     x 3  . CONVERSION TO TOTAL KNEE Left 10/09/2016   Procedure: Conversion left uni compartment arthroplasty to total knee arthroplasty;  Surgeon: Paralee Cancel, MD;  Location: [facility];  Service: Orthopedics;  Laterality: Left;  90 mins  . KNEE ARTHROSCOPY     partial  . total left knee arthoplasty     10/09/16 Dr. Alvan Dame   Social History   Social History Narrative  . Not on file   Immunization History  Administered Date(s) Administered  . Influenza Whole 11/12/2012  . Influenza,inj,Quad PF,6+ Mos 12/11/2017  . Influenza-Unspecified 12/15/2013, 12/12/2015, 11/29/2016  . Td 11/03/2002  .  Tdap 09/23/2015  . Zoster 08/12/2012  . Zoster Recombinat (Shingrix) 03/22/2018, 08/20/2018     Objective: Vital Signs: BP (!) 141/85 (BP Location: Left Wrist, Patient Position: Sitting, Cuff Size: Normal)   Pulse 93   Resp 15   Ht 5' 8.5" (1.74 m)   Wt 239 lb 12.8 oz (108.8 kg)   BMI 35.93 kg/m    Physical Exam Vitals signs and nursing note reviewed.  Constitutional:      Appearance: She is well-developed.  HENT:     Head: Normocephalic and atraumatic.  Eyes:     Conjunctiva/sclera: Conjunctivae normal.  Neck:     Musculoskeletal: Normal range of motion.  Cardiovascular:     Rate and Rhythm: Normal rate and regular rhythm.     Heart sounds: Normal heart sounds.  Pulmonary:     Effort: Pulmonary effort is normal.     Breath sounds: Normal breath sounds.  Abdominal:     General: Bowel sounds are normal.     Palpations: Abdomen is soft.  Lymphadenopathy:     Cervical: No cervical adenopathy.  Skin:    General: Skin  is warm and dry.     Capillary Refill: Capillary refill takes less than 2 seconds.  Neurological:     Mental Status: She is alert and oriented to person, place, and time.  Psychiatric:        Behavior: Behavior normal.      Musculoskeletal Exam: C-spine limited ROM.  Thoracic and lumbar spine good ROM.  No midline spinal tenderness.  No SI joint tenderness. Shoulder joints and elbow joints good ROM with no discomfort.  Wrist joints good ROM.  She has diffuse swelling on the dorsal aspect of the left wrist.  MCPs, PIPs, and DIPs good ROM with no synovitis.  Hip joints good ROM with no discomfort.  Tenderness over the left trochanteric bursa.  Left knee replacement has warmth but no effusion.  Mild warmth of right knee joint.  Ankle joints good ROM with no discomfort or effusion.   CDAI Exam: CDAI Score: 1.4  Patient Global: 2 mm; Provider Global: 2 mm Swollen: 1 ; Tender: 0  Joint Exam      Right  Left  Wrist     Swollen      Investigation: No  additional findings.  Imaging: No results found.  Recent Labs: Lab Results  Component Value Date   WBC 7.6 11/28/2018   HGB 11.7 (L) 11/28/2018   PLT 281 11/28/2018   NA 140 11/28/2018   K 3.8 11/28/2018   CL 103 11/28/2018   CO2 28 11/28/2018   GLUCOSE 95 11/28/2018   BUN 21 11/28/2018   CREATININE 1.00 11/28/2018   BILITOT 0.3 11/28/2018   ALKPHOS 76 11/28/2018   AST 20 11/28/2018   ALT 26 11/28/2018   PROT 7.7 11/28/2018   ALBUMIN 4.2 11/28/2018   CALCIUM 9.2 11/28/2018   GFRAA >60 11/28/2018   QFTBGOLDPLUS NEGATIVE 04/26/2018    Speciality Comments: No specialty comments available.  Procedures:  No procedures performed Allergies: Patient has no known allergies.     Assessment / Plan:     Visit Diagnoses: Rheumatoid arthritis involving multiple sites with positive rheumatoid factor ([place]) - 03/14/18: ANA 1:160 Cytoplasmic, uric acid 53., RF 116, sed rate 22: She has synovitis of the left wrist joint and warmth of the right knee joint on exam.  She was started on Methotrexate 6 tablets weekly in April 2020, and she has been taking Methotrexate 8 tablets by mouth once weekly since June 2020. She has noticed a significant improvement in her symptoms since starting MTX tablets.  Due to the persistent synovitis, we discussed switching her to injectable MTX0.8 ml sq once weekly and adding on Plaquenil 200 mg by mouth twice daily.  Indications, contraindications, and potential side effects of Plaquenil were discussed.  She was advised to notify us if she cannot tolerate taking PLQ.  She will require a baseline eye exam within 1 month of starting on PLQ.    Patient was counseled on the purpose, proper use, and adverse effects of hydroxychloroquine including nausea/diarrhea, skin rash, headaches, and sun sensitivity.  Discussed importance of annual eye exams while on hydroxychloroquine to monitor to ocular toxicity and discussed importance of frequent laboratory monitoring.  Provided  patient with eye exam form for baseline ophthalmologic exam.  Provided patient with educational materials on hydroxychloroquine and answered all questions.  Patient consented to hydroxychloroquine.  Will upload consent in the media tab.    Dose will be Plaquenil 200 mg twice daily.  Prescription pending lab results.   High risk medication use -Methotrexate 8 tablets  every 7 days and folic acid 1 mg 2 tablets daily. Most recent CBC/CMP within normal limits except for slightly decreased hemoglobin but stable on 11/28/2018. She will return for lab work in December and every 3 months.  We discussed the importance of holding MTX if she develops any signs or symptoms of an infection and to resume once the infection has cleared.   Primary osteoarthritis of right knee: She has mild warmth but no effusion.  She has intermittent discomfort in the right knee joint, but she states the pain has improved significantly since starting on MTX.   S/P left TK revision: Doing well.  She has no discomfort at this time.  She has warmth but no effusion.   Trochanteric bursitis, left hip: She has tenderness over the left trochanteric bursa.  She has been performing stretching exercises regularly. We discussed that if her symptoms are persistent physical therapy and/or a cortisone injection are treatment options.   Traumatic incomplete tear of left rotator cuff, subsequent encounter: She has good ROM with no discomfort at this time.   DDD (degenerative disc disease), lumbar: She has intermittent lower back pain.  She has no symptoms of radiculopathy.   Other medial conditions are listed as follows:   Anxiety and depression  History of hyperlipidemia  Essential hypertension  History of gastroesophageal reflux (GERD)  Other proteinuria  Orders: No orders of the defined types were placed in this encounter.  No orders of the defined types were placed in this encounter.   Face-to-face time spent with patient was  30 minutes. Greater than 50% of time was spent in counseling and coordination of care.  Follow-Up Instructions: Return in 4 weeks (on 01/13/2019) for Rheumatoid arthritis, Osteoarthritis, DDD.   Pamela Neas, PA-C   I examined and evaluated the patient with Pamela Sams PA.  Patient has significant synovitis in her left wrist joint on my examination.  She has noticed improvement on oral methotrexate but not sufficient to suppress her disease process.  After discussing different treatment options we decided to switch her to subcu methotrexate and added Plaquenil to her regimen.  Indication side effects contraindications of the medications were discussed.  She will be starting on Plaquenil 200 mg p.o. twice daily.  She will need close monitoring to monitor for ocular toxicity.  The plan of care was discussed as noted above.  Bo Merino, MD  Note - This record has been created using Editor, commissioning.  Chart creation errors have been sought, but may not always  have been located. Such creation errors do not reflect on  the standard of medical care.

## 2018-12-02 NOTE — Addendum Note (Signed)
Addended by: Biagio Borg on: 12/02/2018 02:05 PM   Modules accepted: Orders

## 2018-12-02 NOTE — Telephone Encounter (Signed)
Done erx 

## 2018-12-03 ENCOUNTER — Ambulatory Visit: Payer: Medicare HMO | Admitting: Internal Medicine

## 2018-12-16 ENCOUNTER — Ambulatory Visit (INDEPENDENT_AMBULATORY_CARE_PROVIDER_SITE_OTHER): Payer: Medicare HMO | Admitting: Rheumatology

## 2018-12-16 ENCOUNTER — Encounter: Payer: Self-pay | Admitting: Physician Assistant

## 2018-12-16 ENCOUNTER — Other Ambulatory Visit: Payer: Self-pay

## 2018-12-16 ENCOUNTER — Telehealth: Payer: Self-pay | Admitting: Pharmacy Technician

## 2018-12-16 VITALS — BP 141/85 | HR 93 | Resp 15 | Ht 68.5 in | Wt 239.8 lb

## 2018-12-16 DIAGNOSIS — M1711 Unilateral primary osteoarthritis, right knee: Secondary | ICD-10-CM

## 2018-12-16 DIAGNOSIS — M0579 Rheumatoid arthritis with rheumatoid factor of multiple sites without organ or systems involvement: Secondary | ICD-10-CM

## 2018-12-16 DIAGNOSIS — M5136 Other intervertebral disc degeneration, lumbar region: Secondary | ICD-10-CM

## 2018-12-16 DIAGNOSIS — R808 Other proteinuria: Secondary | ICD-10-CM

## 2018-12-16 DIAGNOSIS — Z79899 Other long term (current) drug therapy: Secondary | ICD-10-CM | POA: Diagnosis not present

## 2018-12-16 DIAGNOSIS — F419 Anxiety disorder, unspecified: Secondary | ICD-10-CM

## 2018-12-16 DIAGNOSIS — Z8639 Personal history of other endocrine, nutritional and metabolic disease: Secondary | ICD-10-CM

## 2018-12-16 DIAGNOSIS — I10 Essential (primary) hypertension: Secondary | ICD-10-CM

## 2018-12-16 DIAGNOSIS — F32A Depression, unspecified: Secondary | ICD-10-CM

## 2018-12-16 DIAGNOSIS — S46012D Strain of muscle(s) and tendon(s) of the rotator cuff of left shoulder, subsequent encounter: Secondary | ICD-10-CM

## 2018-12-16 DIAGNOSIS — F329 Major depressive disorder, single episode, unspecified: Secondary | ICD-10-CM

## 2018-12-16 DIAGNOSIS — Z8719 Personal history of other diseases of the digestive system: Secondary | ICD-10-CM

## 2018-12-16 DIAGNOSIS — M7062 Trochanteric bursitis, left hip: Secondary | ICD-10-CM | POA: Diagnosis not present

## 2018-12-16 DIAGNOSIS — Z96652 Presence of left artificial knee joint: Secondary | ICD-10-CM

## 2018-12-16 DIAGNOSIS — M51369 Other intervertebral disc degeneration, lumbar region without mention of lumbar back pain or lower extremity pain: Secondary | ICD-10-CM

## 2018-12-16 MED ORDER — METHOTREXATE SODIUM CHEMO INJECTION 50 MG/2ML: 20.0000 mg | INTRAMUSCULAR | 0 refills | Status: DC

## 2018-12-16 MED ORDER — "TUBERCULIN SYRINGE 27G X 1/2"" 1 ML MISC": 12.0000 | 3 refills | Status: DC

## 2018-12-16 MED ORDER — HYDROXYCHLOROQUINE SULFATE 200 MG PO TABS: 200.0000 mg | ORAL_TABLET | Freq: Two times a day (BID) | ORAL | 0 refills | Status: DC

## 2018-12-16 NOTE — Telephone Encounter (Signed)
Submitted a Non-formulary prior authorization request to [facility] for [facility] via telephone. Will update once we receive a response.  Josem Kaufmann- [place]- [id number] Phone# [phone]  11:36 AM Beatriz Chancellor, CPhT

## 2018-12-16 NOTE — Progress Notes (Signed)
Pharmacy Note  Subjective: Patient presents today to the [facility] to see Dr. Estanislado Pandy.  Patient seen by the pharmacist for counseling on hydroxychloroquine rheumatoid arthritis.  Prior therapy includes: methotrexate.  Objective: CMP     Component Value Date/Time   NA 140 11/28/2018 0858   K 3.8 11/28/2018 0858   CL 103 11/28/2018 0858   CO2 28 11/28/2018 0858   GLUCOSE 95 11/28/2018 0858   BUN 21 11/28/2018 0858   CREATININE 1.00 11/28/2018 0858   CREATININE 0.85 09/06/2018 1210   CALCIUM 9.2 11/28/2018 0858   PROT 7.7 11/28/2018 0858   ALBUMIN 4.2 11/28/2018 0858   AST 20 11/28/2018 0858   ALT 26 11/28/2018 0858   ALKPHOS 76 11/28/2018 0858   BILITOT 0.3 11/28/2018 0858   GFRNONAA 59 (L) 11/28/2018 0858   GFRNONAA 72 09/06/2018 1210   GFRAA >60 11/28/2018 0858   GFRAA 84 09/06/2018 1210    CBC    Component Value Date/Time   WBC 7.6 11/28/2018 0858   WBC 7.7 09/06/2018 1210   RBC 4.27 11/28/2018 0858   HGB 11.7 (L) 11/28/2018 0858   HCT 34.9 (L) 11/28/2018 0858   PLT 281 11/28/2018 0858   MCV 81.7 11/28/2018 0858   MCH 27.4 11/28/2018 0858   MCHC 33.5 11/28/2018 0858   RDW 12.5 11/28/2018 0858   LYMPHSABS 1.1 11/28/2018 0858   MONOABS 0.9 11/28/2018 0858   EOSABS 0.3 11/28/2018 0858   BASOSABS 0.0 11/28/2018 0858    Assessment/Plan: Patient was counseled on the purpose, proper use, and adverse effects of hydroxychloroquine including nausea/diarrhea, skin rash, headaches, and sun sensitivity.  Discussed importance of annual eye exams while on hydroxychloroquine to monitor to ocular toxicity and discussed importance of frequent laboratory monitoring.  Provided patient with eye exam form for baseline ophthalmologic exam and standing lab instructions.  Provided patient with educational materials on hydroxychloroquine and answered all questions.  Patient consented to hydroxychloroquine.  Will upload consent in the media tab.     Dose will be Plaquenil  200 mg twice daily based on weight of 108.8, height of 5\' 8" , and GFR > 60 ml/min.  She is also switching to injectable MTX.  Pen devices require non-formulary exception PA.  We will initiate PA and update patient when we receive a response.  She will start MTX vial and syringe in the meantime.  Educated patient on how to use a vial and syringe and reviewed injection technique with patient.  Patient was able to demonstrate proper technique for injections using vial and syringe.  Provided patient educational material regarding injection technique and storage of methotrexate.    All questions encouraged and answered.  Instructed patient to call with any questions or concerns.  Mariella Saa, PharmD, [place], [place] Clinical Specialty Pharmacist [phone]  12/16/2018 11:31 AM

## 2018-12-16 NOTE — Patient Instructions (Signed)
Standing Labs We placed an order today for your standing lab work.    Please come back and get your standing labs in December and every 3 months   We have open lab daily Monday through Thursday from 8:30-12:30 PM and 1:30-4:30 PM and Friday from 8:30-12:30 PM and 1:30-4:00 PM at the office of Dr. Shaili Deveshwar.   You may experience shorter wait times on Monday and Friday afternoons. The office is located at [address], [address], [place], [place] [address] No appointment is necessary.   Labs are drawn by [facility].  You may receive a bill from [facility] for your lab work.  If you wish to have your labs drawn at another location, please call the office 24 hours in advance to send orders.  If you have any questions regarding directions or hours of operation,  please call [phone].   Just as a reminder please drink plenty of water prior to coming for your lab work. Thanks!   

## 2018-12-17 ENCOUNTER — Telehealth: Payer: Self-pay

## 2018-12-17 NOTE — Telephone Encounter (Signed)
Appt cancelled

## 2018-12-17 NOTE — Telephone Encounter (Signed)
Copied from [place] [id number]. Topic: General - Other >> Dec 17, 2018  3:49 PM Virl Axe D wrote: Reason for CRM: Pt called primary care office to cancel GI appt for tomorrow. Left pt vm to call GI to cancel.

## 2018-12-18 ENCOUNTER — Ambulatory Visit: Payer: Medicare HMO | Admitting: Gastroenterology

## 2018-12-19 ENCOUNTER — Encounter: Payer: Self-pay | Admitting: Pharmacist

## 2018-12-19 NOTE — Telephone Encounter (Signed)
Appeal faxed to [facility].  Will update when we receive a response.

## 2018-12-19 NOTE — Telephone Encounter (Signed)
Received a fax regarding Prior Authorization from [facility] for Pamela [name]. Authorization has been DENIED because Rep or PA reviewer incorrectly processed PA quantity. Rep Bryson Ha advise that the best way to process is to complete Expedited appeal as the plan will want additional info. Requesting a statement from the provider why the requested medication is being chosen and why the patient hasn't tried Leflunomide and Azathioprine.  Please fax statement and chart notes to [phone]. Turn around would be up to 72 hours.  Phone# [phone] PA# [id number]  11:44 AM Beatriz Chancellor, CPhT

## 2018-12-19 NOTE — Progress Notes (Signed)
Appeal faxed to [facility] for Rasuvo coverage.

## 2018-12-20 NOTE — Telephone Encounter (Signed)
Received message from office that Rasuvo was Approved through insurance. Ran test claim, patient's copay for 1 month is $255.00. Patient has a $160 pharmacy deductible being applied to claim. Patient can apply for [employer] Patient Assistance Program.  2:58 PM Beatriz Chancellor, CPhT

## 2018-12-20 NOTE — Telephone Encounter (Signed)
Received call from [facility] rep asking additional questions about Rasuvo appeal.    Advised that patient has not tried [place] or [place].  She is currently on Plaquenil and methotrexate with good response.  She was switched from oral to injectable methotrexate.  She is having difficulty using vial and syringe due to swelling in her wrist.  Would like to maximize current MTX therapy before switching to preferred alternative.  Pamela [name] documented and forwarded to MD for review.  Will update when we receive a response.  Mariella Saa, PharmD, [place], [place] Clinical Specialty Pharmacist [phone]  12/20/2018 2:28 PM

## 2018-12-25 ENCOUNTER — Other Ambulatory Visit: Payer: Self-pay

## 2018-12-25 ENCOUNTER — Encounter: Payer: Self-pay | Admitting: Internal Medicine

## 2018-12-25 ENCOUNTER — Ambulatory Visit (INDEPENDENT_AMBULATORY_CARE_PROVIDER_SITE_OTHER): Payer: Medicare HMO | Admitting: Internal Medicine

## 2018-12-25 ENCOUNTER — Other Ambulatory Visit: Payer: Self-pay | Admitting: Internal Medicine

## 2018-12-25 ENCOUNTER — Other Ambulatory Visit (INDEPENDENT_AMBULATORY_CARE_PROVIDER_SITE_OTHER): Payer: Medicare HMO

## 2018-12-25 VITALS — BP 138/86 | HR 103 | Temp 98.6°F | Ht 68.5 in | Wt 235.0 lb

## 2018-12-25 DIAGNOSIS — E611 Iron deficiency: Secondary | ICD-10-CM

## 2018-12-25 DIAGNOSIS — E559 Vitamin D deficiency, unspecified: Secondary | ICD-10-CM

## 2018-12-25 DIAGNOSIS — R739 Hyperglycemia, unspecified: Secondary | ICD-10-CM

## 2018-12-25 DIAGNOSIS — M0579 Rheumatoid arthritis with rheumatoid factor of multiple sites without organ or systems involvement: Secondary | ICD-10-CM

## 2018-12-25 DIAGNOSIS — Z23 Encounter for immunization: Secondary | ICD-10-CM

## 2018-12-25 DIAGNOSIS — E538 Deficiency of other specified B group vitamins: Secondary | ICD-10-CM

## 2018-12-25 DIAGNOSIS — Z0000 Encounter for general adult medical examination without abnormal findings: Secondary | ICD-10-CM | POA: Diagnosis not present

## 2018-12-25 DIAGNOSIS — M7062 Trochanteric bursitis, left hip: Secondary | ICD-10-CM | POA: Diagnosis not present

## 2018-12-25 DIAGNOSIS — I10 Essential (primary) hypertension: Secondary | ICD-10-CM | POA: Diagnosis not present

## 2018-12-25 DIAGNOSIS — Z0001 Encounter for general adult medical examination with abnormal findings: Secondary | ICD-10-CM

## 2018-12-25 DIAGNOSIS — E782 Mixed hyperlipidemia: Secondary | ICD-10-CM

## 2018-12-25 DIAGNOSIS — M7072 Other bursitis of hip, left hip: Secondary | ICD-10-CM | POA: Insufficient documentation

## 2018-12-25 LAB — URINALYSIS, ROUTINE W REFLEX MICROSCOPIC
Bilirubin Urine: NEGATIVE
Ketones, ur: NEGATIVE
Leukocytes,Ua: NEGATIVE
Nitrite: NEGATIVE
RBC / HPF: NONE SEEN (ref 0–?)
Specific Gravity, Urine: 1.015 (ref 1.000–1.030)
Total Protein, Urine: 100 — AB
Urine Glucose: NEGATIVE
Urobilinogen, UA: 0.2 (ref 0.0–1.0)
pH: 6 (ref 5.0–8.0)

## 2018-12-25 LAB — HEPATIC FUNCTION PANEL
ALT: 15 U/L (ref 0–35)
AST: 13 U/L (ref 0–37)
Albumin: 4.3 g/dL (ref 3.5–5.2)
Alkaline Phosphatase: 74 U/L (ref 39–117)
Bilirubin, Direct: 0.1 mg/dL (ref 0.0–0.3)
Total Bilirubin: 0.4 mg/dL (ref 0.2–1.2)
Total Protein: 7.7 g/dL (ref 6.0–8.3)

## 2018-12-25 LAB — LIPID PANEL
Cholesterol: 179 mg/dL (ref 0–200)
HDL: 71 mg/dL (ref >39.00)
LDL Cholesterol: 82 mg/dL (ref 0–99)
NonHDL: 107.92
Total CHOL/HDL Ratio: 3
Triglycerides: 131 mg/dL (ref 0.0–149.0)
VLDL: 26.2 mg/dL (ref 0.0–40.0)

## 2018-12-25 LAB — IBC PANEL
Iron: 64 ug/dL (ref 42–145)
Saturation Ratios: 19.1 % — ABNORMAL LOW (ref 20.0–50.0)
Transferrin: 239 mg/dL (ref 212.0–360.0)

## 2018-12-25 LAB — CBC WITH DIFFERENTIAL/PLATELET
Basophils Absolute: 0 10*3/uL (ref 0.0–0.1)
Basophils Relative: 0.3 % (ref 0.0–3.0)
Eosinophils Absolute: 0.1 10*3/uL (ref 0.0–0.7)
Eosinophils Relative: 1.7 % (ref 0.0–5.0)
HCT: 36.3 % (ref 36.0–46.0)
Hemoglobin: 11.9 g/dL — ABNORMAL LOW (ref 12.0–15.0)
Lymphocytes Relative: 10.3 % — ABNORMAL LOW (ref 12.0–46.0)
Lymphs Abs: 0.8 10*3/uL (ref 0.7–4.0)
MCHC: 32.8 g/dL (ref 30.0–36.0)
MCV: 85.7 fl (ref 78.0–100.0)
Monocytes Absolute: 0.6 10*3/uL (ref 0.1–1.0)
Monocytes Relative: 7.5 % (ref 3.0–12.0)
Neutro Abs: 6.1 10*3/uL (ref 1.4–7.7)
Neutrophils Relative %: 80.2 % — ABNORMAL HIGH (ref 43.0–77.0)
Platelets: 276 10*3/uL (ref 150.0–400.0)
RBC: 4.23 Mil/uL (ref 3.87–5.11)
RDW: 13.1 % (ref 11.5–15.5)
WBC: 7.6 10*3/uL (ref 4.0–10.5)

## 2018-12-25 LAB — BASIC METABOLIC PANEL
BUN: 16 mg/dL (ref 6–23)
CO2: 26 mEq/L (ref 19–32)
Calcium: 9.7 mg/dL (ref 8.4–10.5)
Chloride: 101 mEq/L (ref 96–112)
Creatinine, Ser: 0.85 mg/dL (ref 0.40–1.20)
GFR: 81.38 mL/min (ref >60.00)
Glucose, Bld: 110 mg/dL — ABNORMAL HIGH (ref 70–99)
Potassium: 3.7 mEq/L (ref 3.5–5.1)
Sodium: 138 mEq/L (ref 135–145)

## 2018-12-25 LAB — VITAMIN D 25 HYDROXY (VIT D DEFICIENCY, FRACTURES): VITD: 21.47 ng/mL — ABNORMAL LOW (ref 30.00–100.00)

## 2018-12-25 LAB — TSH: TSH: 1.09 u[IU]/mL (ref 0.35–4.50)

## 2018-12-25 LAB — HEMOGLOBIN A1C: Hgb A1c MFr Bld: 5.5 % (ref 4.6–6.5)

## 2018-12-25 LAB — VITAMIN B12: Vitamin B-12: 358 pg/mL (ref 211–911)

## 2018-12-25 MED ORDER — TRAMADOL HCL 50 MG PO TABS: ORAL_TABLET | ORAL | 2 refills | Status: DC

## 2018-12-25 MED ORDER — VITAMIN D (ERGOCALCIFEROL) 1.25 MG (50000 UNIT) PO CAPS: 50000.0000 [IU] | ORAL_CAPSULE | ORAL | 0 refills | Status: DC

## 2018-12-25 NOTE — Assessment & Plan Note (Signed)

## 2018-12-25 NOTE — Assessment & Plan Note (Signed)
For tramadol prn

## 2018-12-25 NOTE — Telephone Encounter (Signed)
Received notification from [facility] regarding a prior authorization for RASUVO. Authorization has been APPROVED from 03/13/2018 to 03/12/2020.   Will send document to scan center.  Authorization # [id number]  Called to notify patient.  No answer.  Left voicemail.  Mariella Saa, PharmD, [place], [place] Clinical Specialty Pharmacist [phone]  12/25/2018 11:06 AM

## 2018-12-25 NOTE — Assessment & Plan Note (Signed)
stable overall by history and exam, recent data reviewed with pt, and pt to continue medical treatment as before,  to f/u any worsening symptoms or concerns  

## 2018-12-25 NOTE — Assessment & Plan Note (Signed)
To f/u rheum as planned

## 2018-12-25 NOTE — Telephone Encounter (Signed)
Recieved voicemail from patient returning call.  Called patient and no answer.  Will follow up at another time.   Mariella Saa, PharmD, [place], [place] Clinical Specialty Pharmacist [phone]  12/25/2018 2:11 PM

## 2018-12-25 NOTE — Patient Instructions (Addendum)
You will be contacted regarding the referral for: GYN  You had the flu shot today  Please continue all other medications as before, and refills have been done if requested - the tramadol  Please have the pharmacy call with any other refills you may need.  Please continue your efforts at being more active, low cholesterol diet, and weight control.  You are otherwise up to date with prevention measures today.  Please keep your appointments with your specialists as you may have planned  Please go to the LAB in the Basement (turn left off the elevator) for the tests to be done today  You will be contacted by phone if any changes need to be made immediately.  Otherwise, you will receive a letter about your results with an explanation, but please check with MyChart first.  Please remember to sign up for MyChart if you have not done so, as this will be important to you in the future with finding out test results, communicating by private email, and scheduling acute appointments online when needed.  Please return in 1 year for your yearly visit, or sooner if needed, with Lab testing done 3-5 days before

## 2018-12-25 NOTE — Progress Notes (Signed)
Subjective:    Patient ID: Pamela [name], female    DOB: [birth year], 63 y.o.   MRN: [id number]  HPI  Here for wellness and f/u;  Overall doing ok;  Pt denies Chest pain, worsening SOB, DOE, wheezing, orthopnea, PND, worsening LE edema, palpitations, dizziness or syncope.  Pt denies neurological change such as new headache, facial or extremity weakness.  Pt denies polydipsia, polyuria, or low sugar symptoms. Pt states overall good compliance with treatment and medications, good tolerability, and has been trying to follow appropriate diet.  Pt denies worsening depressive symptoms, suicidal ideation or panic. No fever, night sweats, wt loss, loss of appetite, or other constitutional symptoms.  Pt states good ability with ADL's, has low fall risk, home safety reviewed and adequate, no other significant changes in hearing or vision, and only occasionally active with exercise, limited due to left hip bursitis, asks for tramadol refill. Wt Readings from Last 3 Encounters:  12/25/18 235 lb (106.6 kg)  12/16/18 239 lb 12.8 oz (108.8 kg)  11/28/18 238 lb 1.6 oz (108 kg)   BP Readings from Last 3 Encounters:  12/25/18 138/86  12/16/18 (!) 141/85  11/28/18 (!) 132/91   Past Medical History:  Diagnosis Date  . ALLERGIC RHINITIS 12/18/2006   Qualifier: Diagnosis of  By: [place], [place]    . Anxiety   . Arthritis   . DEPRESSION, SITUATIONAL 03/15/2007   Qualifier: Diagnosis of  By: Wynona Luna   . GERD (gastroesophageal reflux disease)   . Headache    occasional  . History of kidney stones   . HYPERLIPIDEMIA 12/18/2006   Qualifier: History of  By: Danny Lawless CMA, [place]    . Hypertension   . HYPERTENSION 12/18/2006   Qualifier: Diagnosis of  By: Danny Lawless CMA, [place]    . INSOMNIA-SLEEP DISORDER-UNSPEC 08/25/2009   Qualifier: Diagnosis of  By: Jenny Reichmann MD, Hunt Oris   . Pain in knee region after total knee replacement ([facility]) 04/03/2012  . Pneumonia   . RENAL CALCULUS, HX OF 12/18/2006   Qualifier:  Diagnosis of  By: [place], [place]     Past Surgical History:  Procedure Laterality Date  . CERVICAL DISC SURGERY     x 3  . CONVERSION TO TOTAL KNEE Left 10/09/2016   Procedure: Conversion left uni compartment arthroplasty to total knee arthroplasty;  Surgeon: Paralee Cancel, MD;  Location: [facility];  Service: Orthopedics;  Laterality: Left;  90 mins  . KNEE ARTHROSCOPY     partial  . total left knee arthoplasty     10/09/16 Dr. Alvan Dame    reports that she quit smoking about 50 years ago. Her smoking use included cigarettes. She has a 0.75 pack-year smoking history. She has never used smokeless tobacco. She reports current alcohol use. She reports that she does not use drugs. family history includes Healthy in her daughter, daughter, and daughter; Heart disease in her father; Lung cancer in her brother and sister; Stroke in her mother. No Known Allergies Current Outpatient Medications on File Prior to Visit  Medication Sig Dispense Refill  . ALPRAZolam (XANAX) 0.25 MG tablet TAKE 1 TABLET BY MOUTH TWICE DAILY AS NEEDED 60 tablet 5  . atorvastatin (LIPITOR) 20 MG tablet TAKE 1 TABLET BY MOUTH EVERY DAY 90 tablet 0  . Camphor-Menthol-Methyl Sal (TIGER BALM MUSCLE RUB EX) Apply 1 application topically daily as needed (pain).    . citalopram (CELEXA) 10 MG tablet Take 1 tablet (10 mg total) by mouth daily. [place]  tablet 3  . diclofenac sodium (VOLTAREN) 1 % GEL Apply 2 g topically 4 (four) times daily. 1 Tube 2  . folic acid (FOLVITE) 1 MG tablet Take 2 tablets (2 mg total) by mouth daily. 180 tablet 3  . gabapentin (NEURONTIN) 300 MG capsule Take 300 mg by mouth 2 (two) times daily.   0  . hydrochlorothiazide (MICROZIDE) 12.5 MG capsule Take 1 capsule (12.5 mg total) by mouth daily. 90 capsule 3  . hydroxychloroquine (PLAQUENIL) 200 MG tablet Take 1 tablet (200 mg total) by mouth 2 (two) times daily. 180 tablet 0  . losartan (COZAAR) 50 MG tablet Take 1 tablet (50 mg total) by mouth daily. 90 tablet  3  . methotrexate 50 MG/2ML injection Inject 0.8 mLs (20 mg total) into the skin once a week. 10 mL 0  . metoCLOPramide (REGLAN) 10 MG tablet Take 1 tablet (10 mg total) by mouth 2 (two) times daily. 60 tablet 11  . pantoprazole (PROTONIX) 40 MG tablet TAKE 1 TABLET(40 MG) BY MOUTH DAILY 30 tablet 2  . TUBERCULIN SYR 1CC/27GX1/2" (B-D TB SYRINGE 1CC/27GX1/2") 27G X 1/2" 1 ML MISC 12 Syringes by Does not apply route once a week. 12 each 3  . zolpidem (AMBIEN) 10 MG tablet TAKE 1 TABLET BY MOUTH AT BEDTIME FOR SLEEP 90 tablet 1   No current facility-administered medications on file prior to visit.    Review of Systems Constitutional: Negative for other unusual diaphoresis, sweats, appetite or weight changes HENT: Negative for other worsening hearing loss, ear pain, facial swelling, mouth sores or neck stiffness.   Eyes: Negative for other worsening pain, redness or other visual disturbance.  Respiratory: Negative for other stridor or swelling Cardiovascular: Negative for other palpitations or other chest pain  Gastrointestinal: Negative for worsening diarrhea or loose stools, blood in stool, distention or other pain Genitourinary: Negative for hematuria, flank pain or other change in urine volume.  Musculoskeletal: Negative for myalgias or other joint swelling.  Skin: Negative for other color change, or other wound or worsening drainage.  Neurological: Negative for other syncope or numbness. Hematological: Negative for other adenopathy or swelling Psychiatric/Behavioral: Negative for hallucinations, other worsening agitation, SI, self-injury, or new decreased concentration\ All otherwise neg per pt    Objective:   Physical Exam BP 138/86   Pulse (!) 103   Temp 98.6 F (37 C) (Oral)   Ht 5' 8.5" (1.74 m)   Wt 235 lb (106.6 kg)   SpO2 97%   BMI 35.21 kg/m  VS noted,  Constitutional: Pt is oriented to person, place, and time. Appears well-developed and well-nourished, in no significant  distress and comfortable Head: Normocephalic and atraumatic  Eyes: Conjunctivae and EOM are normal. Pupils are equal, round, and reactive to light Right Ear: External ear normal without discharge Left Ear: External ear normal without discharge Nose: Nose without discharge or deformity Mouth/Throat: Oropharynx is without other ulcerations and moist  Neck: Normal range of motion. Neck supple. No JVD present. No tracheal deviation present or significant neck LA or mass Cardiovascular: Normal rate, regular rhythm, normal heart sounds and intact distal pulses.   Pulmonary/Chest: WOB normal and breath sounds without rales or wheezing  Abdominal: Soft. Bowel sounds are normal. NT. No HSM  Musculoskeletal: Normal range of motion. Exhibits no edema Lymphadenopathy: Has no other cervical adenopathy.  Neurological: Pt is alert and oriented to person, place, and time. Pt has normal reflexes. No cranial nerve deficit. Motor grossly intact, Gait intact Skin: Skin is  warm and dry. No rash noted or new ulcerations Psychiatric:  Has normal mood and affect. Behavior is normal without agitation No other exam findings Lab Results  Component Value Date   WBC 7.6 12/25/2018   HGB 11.9 (L) 12/25/2018   HCT 36.3 12/25/2018   PLT 276.0 12/25/2018   GLUCOSE 110 (H) 12/25/2018   CHOL 179 12/25/2018   TRIG 131.0 12/25/2018   HDL 71.00 12/25/2018   LDLDIRECT 109.9 04/03/2012   LDLCALC 82 12/25/2018   ALT 15 12/25/2018   AST 13 12/25/2018   NA 138 12/25/2018   K 3.7 12/25/2018   CL 101 12/25/2018   CREATININE 0.85 12/25/2018   BUN 16 12/25/2018   CO2 26 12/25/2018   TSH 1.09 12/25/2018   INR 1.0 06/29/2008   HGBA1C 5.5 12/25/2018      Assessment & Plan:

## 2018-12-26 NOTE — Telephone Encounter (Signed)
Left voicemail notifying patient that pending medication had been approved but had a high co-pay due to a deductible.  Requested return call to discuss.   Mariella Saa, PharmD, [place], [place] Clinical Specialty Pharmacist [phone]  12/26/2018 2:50 PM

## 2018-12-27 ENCOUNTER — Telehealth: Payer: Self-pay | Admitting: Rheumatology

## 2018-12-27 NOTE — Telephone Encounter (Signed)
Patient left a voicemail stating she was returning your call.   

## 2018-12-30 ENCOUNTER — Telehealth: Payer: Self-pay | Admitting: Gastroenterology

## 2018-12-30 MED ORDER — METOCLOPRAMIDE HCL 10 MG PO TABS: 10.0000 mg | ORAL_TABLET | Freq: Two times a day (BID) | ORAL | 2 refills | Status: DC

## 2018-12-30 NOTE — Telephone Encounter (Signed)
Pt requested a refill for Reglan.

## 2018-12-30 NOTE — Telephone Encounter (Signed)
May I refill Sir, thank you. 

## 2018-12-30 NOTE — Telephone Encounter (Signed)
Yes, 10mg  every mornign and also every bedtime.  Disp 1 month with 2 refills. She was supposed to see me in follow up about a year ago, please offer my next available OV.  thanks

## 2018-12-30 NOTE — Telephone Encounter (Signed)
Patient has an appointment early November and reglan refilled as approved.

## 2018-12-31 NOTE — Progress Notes (Deleted)
Office Visit Note  Patient: Pamela [name]             Date of Birth: December 10, 1955           MRN: [id number]             PCP: Biagio Borg, MD Referring: Biagio Borg, MD Visit Date: 01/14/2019 Occupation: @GUAROCC @  Subjective:  No chief complaint on file.  She was approved for [facility].  Methotrexate 0.8 ml every 7 days, folic acid 1 mg 2 tablets daily, and Plaquenil 200 mg 1 tablet twice daily (started in October 2020). No Plaquenil eye exam on file. Most recent CBC/BMP/hepatic panel within normal limits except for mild anemia on 12/25/2018.     History of Present Illness: Pamela [name] is a 63 y.o. female ***   Activities of Daily Living:  Patient reports morning stiffness for *** {minute/hour:19697}.   Patient {ACTIONS;DENIES/REPORTS:21021675::"Denies"} nocturnal pain.  Difficulty dressing/grooming: {ACTIONS;DENIES/REPORTS:21021675::"Denies"} Difficulty climbing stairs: {ACTIONS;DENIES/REPORTS:21021675::"Denies"} Difficulty getting out of chair: {ACTIONS;DENIES/REPORTS:21021675::"Denies"} Difficulty using hands for taps, buttons, cutlery, and/or writing: {ACTIONS;DENIES/REPORTS:21021675::"Denies"}  No Rheumatology ROS completed.   PMFS History:  Patient Active Problem List   Diagnosis Date Noted  . Bursitis of left hip 12/25/2018  . Wellness examination 12/25/2018  . MGUS (monoclonal gammopathy of unknown significance) 05/11/2018  . Rheumatoid arthritis involving multiple sites with positive rheumatoid factor ([place]) 05/03/2018  . High risk medication use 05/03/2018  . Status post total left knee replacement 05/03/2018  . DDD (degenerative disc disease), lumbar 05/03/2018  . Primary osteoarthritis of right knee 04/05/2018  . 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 Left rotator cuff tear 12/19/2017  . Left wrist pain 12/12/2017  . Left shoulder pain 08/22/2017  . Chronic pain of right knee 08/22/2017  . Acute pain of right knee 04/17/2017  . Pain of left calf 04/13/2017   . 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 Hyperglycemia 09/23/2015  . Anxiety state 09/23/2015  . Abnormal urine odor 08/25/2015  . GERD (gastroesophageal reflux disease) 08/25/2015  . Upper abdominal pain 04/16/2015  . Early satiety 04/16/2015  . Dysuria 04/16/2015  . Preventative health care 06/18/2014  . Greater trochanteric bursitis of right hip 01/06/2013  . Left lumbar radiculitis 04/03/2012  . Eczema 04/03/2012  . 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 BURSITIS, RIGHT HIP 08/25/2009  . INSOMNIA-SLEEP DISORDER-UNSPEC 08/25/2009  . FATIGUE 08/25/2009  . Cervicalgia 08/14/2007  . Depression 03/15/2007  . HLD (hyperlipidemia) 12/18/2006  . Essential hypertension 12/18/2006  . ALLERGIC RHINITIS 12/18/2006  . RENAL CALCULUS, HX OF 12/18/2006    Past Medical History:  Diagnosis Date  . ALLERGIC RHINITIS 12/18/2006   Qualifier: Diagnosis of  By: [place], [place]    . Anxiety   . Arthritis   . DEPRESSION, SITUATIONAL 03/15/2007   Qualifier: Diagnosis of  By: Wynona Luna   . GERD (gastroesophageal reflux disease)   . Headache    occasional  . History of kidney stones   . HYPERLIPIDEMIA 12/18/2006   Qualifier: History of  By: Danny Lawless CMA, [place]    . Hypertension   . HYPERTENSION 12/18/2006   Qualifier: Diagnosis of  By: Danny Lawless CMA, [place]    . INSOMNIA-SLEEP DISORDER-UNSPEC 08/25/2009   Qualifier: Diagnosis of  By: Jenny Reichmann MD, [place] Oris   . Pain in knee region after total knee replacement ([place]) 04/03/2012  . Pneumonia   . RENAL CALCULUS, HX OF 12/18/2006   Qualifier: Diagnosis of  By: Danny Lawless CMA, [place]  Family History  Problem Relation Age of Onset  . Stroke Mother   . Heart disease Father   . Lung cancer Sister   . Lung cancer Brother   . Healthy Daughter   . Healthy Daughter   . Healthy Daughter    Past Surgical History:  Procedure Laterality Date  . CERVICAL DISC SURGERY     x 3  . CONVERSION TO TOTAL KNEE Left 10/09/2016   Procedure: Conversion left uni compartment  arthroplasty to total knee arthroplasty;  Surgeon: Paralee Cancel, MD;  Location: [facility];  Service: Orthopedics;  Laterality: Left;  90 mins  . KNEE ARTHROSCOPY     partial  . total left knee arthoplasty     10/09/16 Dr. Alvan Dame   Social History   Social History Narrative  . Not on file   Immunization History  Administered Date(s) Administered  . Influenza Whole 11/12/2012  . Influenza,inj,Quad PF,6+ Mos 12/11/2017, 12/25/2018  . Influenza-Unspecified 12/15/2013, 12/12/2015, 11/29/2016  . Td 11/03/2002  . Tdap 09/23/2015  . Zoster 08/12/2012  . Zoster Recombinat (Shingrix) 03/22/2018, 08/20/2018     Objective: Vital Signs: There were no vitals taken for this visit.   Physical Exam   Musculoskeletal Exam: ***  CDAI Exam: CDAI Score: - Patient Global: -; Provider Global: - Swollen: -; Tender: - Joint Exam   No joint exam has been documented for this visit   There is currently no information documented on the homunculus. Go to the Rheumatology activity and complete the homunculus joint exam.  Investigation: No additional findings.  Imaging: No results found.  Recent Labs: Lab Results  Component Value Date   WBC 7.6 12/25/2018   HGB 11.9 (L) 12/25/2018   PLT 276.0 12/25/2018   NA 138 12/25/2018   K 3.7 12/25/2018   CL 101 12/25/2018   CO2 26 12/25/2018   GLUCOSE 110 (H) 12/25/2018   BUN 16 12/25/2018   CREATININE 0.85 12/25/2018   BILITOT 0.4 12/25/2018   ALKPHOS 74 12/25/2018   AST 13 12/25/2018   ALT 15 12/25/2018   PROT 7.7 12/25/2018   ALBUMIN 4.3 12/25/2018   CALCIUM 9.7 12/25/2018   GFRAA >60 11/28/2018   QFTBGOLDPLUS NEGATIVE 04/26/2018    Speciality Comments: No specialty comments available.  Procedures:  No procedures performed Allergies: Patient has no known allergies.   Assessment / Plan:     Visit Diagnoses: No diagnosis found.  Orders: No orders of the defined types were placed in this encounter.  No orders of the defined types were  placed in this encounter.   Face-to-face time spent with patient was *** minutes. Greater than 50% of time was spent in counseling and coordination of care.  Follow-Up Instructions: No follow-ups on file.   Earnestine Mealing, CMA  Note - This record has been created using Editor, commissioning.  Chart creation errors have been sought, but may not always  have been located. Such creation errors do not reflect on  the standard of medical care.

## 2019-01-13 ENCOUNTER — Ambulatory Visit: Payer: Medicare HMO | Admitting: Gastroenterology

## 2019-01-13 ENCOUNTER — Other Ambulatory Visit: Payer: Self-pay | Admitting: Rheumatology

## 2019-01-14 ENCOUNTER — Ambulatory Visit: Payer: Medicare HMO | Admitting: Physician Assistant

## 2019-01-14 ENCOUNTER — Other Ambulatory Visit: Payer: Self-pay

## 2019-01-14 MED ORDER — ATORVASTATIN CALCIUM 20 MG PO TABS: 20.0000 mg | ORAL_TABLET | Freq: Every day | ORAL | 0 refills | Status: DC

## 2019-01-15 ENCOUNTER — Encounter: Payer: Self-pay | Admitting: Physician Assistant

## 2019-01-15 ENCOUNTER — Telehealth: Payer: Self-pay | Admitting: Rheumatology

## 2019-01-15 ENCOUNTER — Ambulatory Visit (INDEPENDENT_AMBULATORY_CARE_PROVIDER_SITE_OTHER): Payer: Medicare HMO | Admitting: Rheumatology

## 2019-01-15 ENCOUNTER — Other Ambulatory Visit: Payer: Self-pay

## 2019-01-15 VITALS — BP 127/84 | HR 104 | Resp 12 | Ht 69.0 in | Wt 242.0 lb

## 2019-01-15 DIAGNOSIS — F329 Major depressive disorder, single episode, unspecified: Secondary | ICD-10-CM

## 2019-01-15 DIAGNOSIS — M51369 Other intervertebral disc degeneration, lumbar region without mention of lumbar back pain or lower extremity pain: Secondary | ICD-10-CM

## 2019-01-15 DIAGNOSIS — M0579 Rheumatoid arthritis with rheumatoid factor of multiple sites without organ or systems involvement: Secondary | ICD-10-CM | POA: Diagnosis not present

## 2019-01-15 DIAGNOSIS — M5136 Other intervertebral disc degeneration, lumbar region: Secondary | ICD-10-CM | POA: Diagnosis not present

## 2019-01-15 DIAGNOSIS — Z79899 Other long term (current) drug therapy: Secondary | ICD-10-CM | POA: Diagnosis not present

## 2019-01-15 DIAGNOSIS — Z96652 Presence of left artificial knee joint: Secondary | ICD-10-CM | POA: Diagnosis not present

## 2019-01-15 DIAGNOSIS — F419 Anxiety disorder, unspecified: Secondary | ICD-10-CM | POA: Diagnosis not present

## 2019-01-15 DIAGNOSIS — M1711 Unilateral primary osteoarthritis, right knee: Secondary | ICD-10-CM

## 2019-01-15 DIAGNOSIS — M7062 Trochanteric bursitis, left hip: Secondary | ICD-10-CM | POA: Diagnosis not present

## 2019-01-15 DIAGNOSIS — Z8639 Personal history of other endocrine, nutritional and metabolic disease: Secondary | ICD-10-CM

## 2019-01-15 DIAGNOSIS — F32A Depression, unspecified: Secondary | ICD-10-CM

## 2019-01-15 DIAGNOSIS — I10 Essential (primary) hypertension: Secondary | ICD-10-CM

## 2019-01-15 DIAGNOSIS — S46012D Strain of muscle(s) and tendon(s) of the rotator cuff of left shoulder, subsequent encounter: Secondary | ICD-10-CM

## 2019-01-15 DIAGNOSIS — Z8719 Personal history of other diseases of the digestive system: Secondary | ICD-10-CM

## 2019-01-15 LAB — COMPLETE METABOLIC PANEL WITH GFR
AG Ratio: 1.3 (calc) (ref 1.0–2.5)
ALT: 20 U/L (ref 6–29)
AST: 15 U/L (ref 10–35)
Albumin: 4 g/dL (ref 3.6–5.1)
Alkaline phosphatase (APISO): 68 U/L (ref 37–153)
BUN: 8 mg/dL (ref 7–25)
CO2: 29 mmol/L (ref 20–32)
Calcium: 9.4 mg/dL (ref 8.6–10.4)
Chloride: 102 mmol/L (ref 98–110)
Creat: 0.89 mg/dL (ref 0.50–0.99)
GFR, Est African American: 79 mL/min/{1.73_m2} (ref >=60)
GFR, Est Non African American: 68 mL/min/{1.73_m2} (ref >=60)
Globulin: 3 g/dL (calc) (ref 1.9–3.7)
Glucose, Bld: 101 mg/dL — ABNORMAL HIGH (ref 65–99)
Potassium: 3.9 mmol/L (ref 3.5–5.3)
Sodium: 139 mmol/L (ref 135–146)
Total Bilirubin: 0.4 mg/dL (ref 0.2–1.2)
Total Protein: 7 g/dL (ref 6.1–8.1)

## 2019-01-15 LAB — CBC WITH DIFFERENTIAL/PLATELET
Absolute Monocytes: 819 cells/uL (ref 200–950)
Basophils Absolute: 77 cells/uL (ref 0–200)
Basophils Relative: 1.2 %
Eosinophils Absolute: 320 cells/uL (ref 15–500)
Eosinophils Relative: 5 %
HCT: 32.2 % — ABNORMAL LOW (ref 35.0–45.0)
Hemoglobin: 10.8 g/dL — ABNORMAL LOW (ref 11.7–15.5)
Lymphs Abs: 1190 cells/uL (ref 850–3900)
MCH: 28 pg (ref 27.0–33.0)
MCHC: 33.5 g/dL (ref 32.0–36.0)
MCV: 83.4 fL (ref 80.0–100.0)
MPV: 10.7 fL (ref 7.5–12.5)
Monocytes Relative: 12.8 %
Neutro Abs: 3994 cells/uL (ref 1500–7800)
Neutrophils Relative %: 62.4 %
Platelets: 298 10*3/uL (ref 140–400)
RBC: 3.86 10*6/uL (ref 3.80–5.10)
RDW: 13.1 % (ref 11.0–15.0)
Total Lymphocyte: 18.6 %
WBC: 6.4 10*3/uL (ref 3.8–10.8)

## 2019-01-15 NOTE — Progress Notes (Signed)
Patient's labs are normal except for mild decrease in her hemoglobin.  I left a message on the answering machine for patient to call back.  Should be okay for her to resume methotrexate.  We should repeat labs in 2 weeks.  She may also add Plaquenil as planned.  Please clarify that she should not take any oral methotrexate and stay only with subcutaneous methotrexate.

## 2019-01-15 NOTE — Telephone Encounter (Signed)
Patient called requesting a return call regarding her prescription of Methotrexate.  Patient also stated that Dr. Estanislado Pandy wanted her to schedule a return appointment in 4 weeks, but she hasn't gotten her Plaquenil eye exam.

## 2019-01-15 NOTE — Telephone Encounter (Signed)
Patient advised the follow up visit is for the new start to PLQ. Patient advised it is okay that she has not had the eye exam yet. Patient advised she will need to schedule one. Patient states she is needing a refill on MTX. Per patient chart a prescription for vial and syringe was sent to the pharmacy 12/16/18. Patient states she has been taking the vial and syringe and the tablets together. Patient has been scheduled for a follow up visit 01/15/19 at 1:00 pm.

## 2019-01-15 NOTE — Progress Notes (Signed)
Office Visit Note  Patient: Pamela [name]             Date of Birth: 18-Aug-1955           MRN: [id number]             PCP: Biagio Borg, MD Referring: Biagio Borg, MD Visit Date: 01/15/2019 Occupation: @GUAROCC @  Subjective:  Pain and swelling in hands.     History of Present Illness: Pamela [name] is a 63 y.o. female with history of seropositive rheumatoid arthritis and osteoarthritis overlap.  She was a started on methotrexate 0.8 mL subcu once a week about 2 weeks ago.  She states she had methotrexate tablets at home and she started taking both together.  She did not to start Plaquenil yet.  She has not noticed any improvement in her symptoms.  She continues to have pain and swelling in her hands.  She is some stiffness in her knee joints.  Activities of Daily Living:  Patient reports morning stiffness for 30 minutes.   Patient Denies nocturnal pain.  Difficulty dressing/grooming: Denies Difficulty climbing stairs: Reports Difficulty getting out of chair: Reports Difficulty using hands for taps, buttons, cutlery, and/or writing: Denies  Review of Systems  Constitutional: Positive for fatigue. Negative for night sweats, weight gain and weight loss.  HENT: Negative for mouth sores, trouble swallowing, trouble swallowing, mouth dryness and nose dryness.   Eyes: Negative for pain, redness, visual disturbance and dryness.  Respiratory: Negative for cough, shortness of breath and difficulty breathing.   Cardiovascular: Negative for chest pain, palpitations, hypertension, irregular heartbeat and swelling in legs/feet.  Gastrointestinal: Negative for blood in stool, constipation and diarrhea.  Endocrine: Negative for increased urination.  Genitourinary: Negative for vaginal dryness.  Musculoskeletal: Positive for arthralgias, joint pain, joint swelling and morning stiffness. Negative for myalgias, muscle weakness, muscle tenderness and myalgias.  Skin: Negative for color change,  rash, hair loss, skin tightness, ulcers and sensitivity to sunlight.  Allergic/Immunologic: Negative for susceptible to infections.  Neurological: Negative for dizziness, memory loss, night sweats and weakness.  Hematological: Negative for swollen glands.  Psychiatric/Behavioral: Positive for depressed mood and sleep disturbance. The patient is nervous/anxious.     PMFS History:  Patient Active Problem List   Diagnosis Date Noted  . Bursitis of left hip 12/25/2018  . Wellness examination 12/25/2018  . MGUS (monoclonal gammopathy of unknown significance) 05/11/2018  . Rheumatoid arthritis involving multiple sites with positive rheumatoid factor ([place]) 05/03/2018  . High risk medication use 05/03/2018  . Status post total left knee replacement 05/03/2018  . DDD (degenerative disc disease), lumbar 05/03/2018  . Primary osteoarthritis of right knee 04/05/2018  . 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 Left rotator cuff tear 12/19/2017  . Left wrist pain 12/12/2017  . Left shoulder pain 08/22/2017  . Chronic pain of right knee 08/22/2017  . Acute pain of right knee 04/17/2017  . Pain of left calf 04/13/2017  . 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 Hyperglycemia 09/23/2015  . Anxiety state 09/23/2015  . Abnormal urine odor 08/25/2015  . GERD (gastroesophageal reflux disease) 08/25/2015  . Upper abdominal pain 04/16/2015  . Early satiety 04/16/2015  . Dysuria 04/16/2015  . Preventative health care 06/18/2014  . Greater trochanteric bursitis of right hip 01/06/2013  . Left lumbar radiculitis 04/03/2012  . Eczema 04/03/2012  . 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 BURSITIS, RIGHT HIP 08/25/2009  . INSOMNIA-SLEEP DISORDER-UNSPEC 08/25/2009  . FATIGUE 08/25/2009  . Cervicalgia  08/14/2007  . Depression 03/15/2007  . HLD (hyperlipidemia) 12/18/2006  . Essential hypertension 12/18/2006  . ALLERGIC RHINITIS 12/18/2006  . RENAL CALCULUS, HX OF 12/18/2006    Past Medical History:  Diagnosis Date  .  ALLERGIC RHINITIS 12/18/2006   Qualifier: Diagnosis of  By: [place], [place]    . Anxiety   . Arthritis   . DEPRESSION, SITUATIONAL 03/15/2007   Qualifier: Diagnosis of  By: Wynona Luna   . GERD (gastroesophageal reflux disease)   . Headache    occasional  . History of kidney stones   . HYPERLIPIDEMIA 12/18/2006   Qualifier: History of  By: Danny Lawless CMA, [place]    . Hypertension   . HYPERTENSION 12/18/2006   Qualifier: Diagnosis of  By: Danny Lawless CMA, [place]    . INSOMNIA-SLEEP DISORDER-UNSPEC 08/25/2009   Qualifier: Diagnosis of  By: Jenny Reichmann MD, Hunt Oris   . Pain in knee region after total knee replacement ([place]) 04/03/2012  . Pneumonia   . RENAL CALCULUS, HX OF 12/18/2006   Qualifier: Diagnosis of  By: Danny Lawless CMA, [place]      Family History  Problem Relation Age of Onset  . Stroke Mother   . Heart disease Father   . Lung cancer Sister   . Lung cancer Brother   . Healthy Daughter   . Healthy Daughter   . Healthy Daughter    Past Surgical History:  Procedure Laterality Date  . CERVICAL DISC SURGERY     x 3  . CONVERSION TO TOTAL KNEE Left 10/09/2016   Procedure: Conversion left uni compartment arthroplasty to total knee arthroplasty;  Surgeon: Paralee Cancel, MD;  Location: [facility];  Service: Orthopedics;  Laterality: Left;  90 mins  . KNEE ARTHROSCOPY     partial  . total left knee arthoplasty     10/09/16 Dr. Alvan Dame   Social History   Social History Narrative  . Not on file   Immunization History  Administered Date(s) Administered  . Influenza Whole 11/12/2012  . Influenza,inj,Quad PF,6+ Mos 12/11/2017, 12/25/2018  . Influenza-Unspecified 12/15/2013, 12/12/2015, 11/29/2016  . Td 11/03/2002  . Tdap 09/23/2015  . Zoster 08/12/2012  . Zoster Recombinat (Shingrix) 03/22/2018, 08/20/2018     Objective: Vital Signs: BP 127/84 (BP Location: Left Arm, Patient Position: Sitting, Cuff Size: Large)   Pulse (!) 104   Resp 12   Ht 5\' 9"  (1.753 m)   Wt 242 lb (109.8 kg)   BMI  35.74 kg/m    Physical Exam Vitals signs and nursing note reviewed.  Constitutional:      Appearance: She is well-developed.  HENT:     Head: Normocephalic and atraumatic.  Eyes:     Conjunctiva/sclera: Conjunctivae normal.  Neck:     Musculoskeletal: Normal range of motion.  Cardiovascular:     Rate and Rhythm: Normal rate and regular rhythm.     Heart sounds: Normal heart sounds.  Pulmonary:     Effort: Pulmonary effort is normal.     Breath sounds: Normal breath sounds.  Abdominal:     General: Bowel sounds are normal.     Palpations: Abdomen is soft.  Lymphadenopathy:     Cervical: No cervical adenopathy.  Skin:    General: Skin is warm and dry.     Capillary Refill: Capillary refill takes less than 2 seconds.  Neurological:     Mental Status: She is alert and oriented to person, place, and time.  Psychiatric:        Behavior:  Behavior normal.      Musculoskeletal Exam: C-spine was in good range of motion.  Shoulder joints elbow joints with good range of motion.  She still have some synovitis over her left wrist joint and left second MCP joint.  She has some discomfort range of motion of her knee joints with no synovitis.  CDAI Exam: CDAI Score: 7  Patient Global: 5 mm; Provider Global: 5 mm Swollen: 2 ; Tender: 4  Joint Exam      Right  Left  Wrist     Swollen Tender  MCP 2     Swollen Tender  Knee   Tender   Tender     Investigation: No additional findings.  Imaging: No results found.  Recent Labs: Lab Results  Component Value Date   WBC 7.6 12/25/2018   HGB 11.9 (L) 12/25/2018   PLT 276.0 12/25/2018   NA 138 12/25/2018   K 3.7 12/25/2018   CL 101 12/25/2018   CO2 26 12/25/2018   GLUCOSE 110 (H) 12/25/2018   BUN 16 12/25/2018   CREATININE 0.85 12/25/2018   BILITOT 0.4 12/25/2018   ALKPHOS 74 12/25/2018   AST 13 12/25/2018   ALT 15 12/25/2018   PROT 7.7 12/25/2018   ALBUMIN 4.3 12/25/2018   CALCIUM 9.7 12/25/2018   GFRAA >60 11/28/2018    QFTBGOLDPLUS NEGATIVE 04/26/2018    Speciality Comments: No specialty comments available.  Procedures:  No procedures performed Allergies: Patient has no known allergies.   Assessment / Plan:     Visit Diagnoses: Rheumatoid arthritis involving multiple sites with positive rheumatoid factor (HCC) - ANA 1:160 Cytoplasmic, uric acid 53., RF 116, sed rate 22: Patient was placed on methotrexate subcutaneously last visit and advised to discontinue methotrexate.  Plaquenil was also added.  Patient states that she got confused and she has been taking subcutaneous methotrexate along with oral methotrexate for the last 2 weeks.  She has not started Plaquenil.  She has not noticed any adverse side effects.  She has not noticed an improvement of her symptoms.  We had detailed discussion with the patient regarding the increased risk of toxicity with overdose of methotrexate.  We will check CBC and CMP today.  She has been advised not to take any more medications at this point until we have lab results back.  We will contact her once the lab results are available.  High risk medication use - Methotrexate 0.8 ml every 7 days, folic acid 1 mg 2 tablets daily, and Plaquenil 200 mg 1 tablet twice daily (started in October 2020). No Plaquenil eye exam on file. Most recent CBC/BMP/hepatic panel within normal limits except for mild anemia on 12/25/2018.  - Plan: CBC with Differential/Platelet, COMPLETE METABOLIC PANEL WITH GFR  Primary osteoarthritis of right knee-she continues to have some discomfort.  S/P left TK revision-chronic pain.  Trochanteric bursitis, left hip-doing better.  Traumatic incomplete tear of left rotator cuff, subsequent encounter  DDD (degenerative disc disease), lumbar  Anxiety and depression  History of hyperlipidemia  Essential hypertension  History of gastroesophageal reflux (GERD)  Orders: Orders Placed This Encounter  Procedures  . CBC with Differential/Platelet  .  COMPLETE METABOLIC PANEL WITH GFR   No orders of the defined types were placed in this encounter.   Follow-Up Instructions: Return in about 4 weeks (around 02/12/2019) for Rheumatoid arthritis.   Bo Merino, MD  Note - This record has been created using Editor, commissioning.  Chart creation errors have been sought, but may  not always  have been located. Such creation errors do not reflect on  the standard of medical care.  

## 2019-01-15 NOTE — Patient Instructions (Signed)
   Stop methotrexate until we call with lab results  Throw away your methotrexate tablets

## 2019-01-16 NOTE — Progress Notes (Signed)
Patient notified of results and verbalized understanding.  Advised patient to continue methotrexate 0.8 ml every 7 days and folic acid 2 mg daily.  She is only to take methotrexate injection not tablets.  Patient verbalized understanding.  She states she threw out her methotrexate tablets.  Instructed patient to return in 2 weeks for labs.  Patient verbalized understanding.  Advised patient that she could start Plaquenil but concerned about making to many changes to medication regimen at once.  Prefer to start Plaquenil after next labs results.  Patient in agreement.    All questions encouraged and answered.  Instructed patient to call with any questions or concerns.   Mariella Saa, PharmD, [place], [place] Clinical Specialty Pharmacist [phone]  01/16/2019 2:36 PM

## 2019-01-17 ENCOUNTER — Encounter: Payer: Self-pay | Admitting: Internal Medicine

## 2019-01-20 ENCOUNTER — Telehealth: Payer: Self-pay | Admitting: Rheumatology

## 2019-01-20 ENCOUNTER — Ambulatory Visit: Payer: Medicare HMO | Admitting: Gastroenterology

## 2019-01-20 NOTE — Telephone Encounter (Signed)
Patient called stating she is in a lot of pain and requesting a return call to discuss medications.

## 2019-01-21 ENCOUNTER — Telehealth: Payer: Self-pay | Admitting: Rheumatology

## 2019-01-21 NOTE — Telephone Encounter (Signed)
Returned call and no answer.  Left voicemail.   Mariella Saa, PharmD, [place], [place] Clinical Specialty Pharmacist [phone]  01/21/2019 1:22 PM

## 2019-01-21 NOTE — Telephone Encounter (Signed)
Patient requesting something to be called in for pain. Patient has stopped MTX, and doing injections. It is not time for next injection, and patient having a lot of pain in her legs. Patient uses [facility] on [address]. Please call to advise.

## 2019-01-21 NOTE — Telephone Encounter (Signed)
See previous phone note.  

## 2019-01-22 MED ORDER — PREDNISONE 5 MG PO TABS: ORAL_TABLET | ORAL | 0 refills | Status: DC

## 2019-01-22 NOTE — Telephone Encounter (Signed)
Called patient to discuss symptoms.  She states she is having severe leg pain.  Asked for Plaquenil to be called in.  Advised patient that Plaquenil takes time to be effective. Declined Plaquenil at this time.  Discussed a prednisone taper starting at 20 mg and taper by 1 tablet every 2 days with Dr. Estanislado Pandy.  Patient willing to try prednisone.  Advised to call and schedule an earlier appointment if her symptoms worsen.  Discussed with Dr. Estanislado Pandy starting the patient on in office Cimzia at next appointment due to issues with medication adherence and understanding.  Mariella Saa, PharmD, [place], [place] Clinical Specialty Pharmacist [phone]  01/22/2019 4:15 PM

## 2019-01-28 NOTE — Progress Notes (Deleted)
Office Visit Note  Patient: Pamela [name]             Date of Birth: [birth year]           MRN: [id number]             PCP: Biagio Borg, MD Referring: Biagio Borg, MD Visit Date: 02/11/2019 Occupation: @GUAROCC @  Subjective:  No chief complaint on file.   History of Present Illness: Pamela [name] is a 63 y.o. female ***   Activities of Daily Living:  Patient reports morning stiffness for *** {minute/hour:19697}.   Patient {ACTIONS;DENIES/REPORTS:21021675::"Denies"} nocturnal pain.  Difficulty dressing/grooming: {ACTIONS;DENIES/REPORTS:21021675::"Denies"} Difficulty climbing stairs: {ACTIONS;DENIES/REPORTS:21021675::"Denies"} Difficulty getting out of chair: {ACTIONS;DENIES/REPORTS:21021675::"Denies"} Difficulty using hands for taps, buttons, cutlery, and/or writing: {ACTIONS;DENIES/REPORTS:21021675::"Denies"}  No Rheumatology ROS completed.   PMFS History:  Patient Active Problem List   Diagnosis Date Noted  . Bursitis of left hip 12/25/2018  . Wellness examination 12/25/2018  . MGUS (monoclonal gammopathy of unknown significance) 05/11/2018  . Rheumatoid arthritis involving multiple sites with positive rheumatoid factor ([place]) 05/03/2018  . High risk medication use 05/03/2018  . Status post total left knee replacement 05/03/2018  . DDD (degenerative disc disease), lumbar 05/03/2018  . Primary osteoarthritis of right knee 04/05/2018  . 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 Left rotator cuff tear 12/19/2017  . Left wrist pain 12/12/2017  . Left shoulder pain 08/22/2017  . Chronic pain of right knee 08/22/2017  . Acute pain of right knee 04/17/2017  . Pain of left calf 04/13/2017  . 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 Hyperglycemia 09/23/2015  . Anxiety state 09/23/2015  . Abnormal urine odor 08/25/2015  . GERD (gastroesophageal reflux disease) 08/25/2015  . Upper abdominal pain 04/16/2015  . Early satiety 04/16/2015  . Dysuria 04/16/2015  . Preventative health  care 06/18/2014  . Greater trochanteric bursitis of right hip 01/06/2013  . Left lumbar radiculitis 04/03/2012  . Eczema 04/03/2012  . 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 BURSITIS, RIGHT HIP 08/25/2009  . INSOMNIA-SLEEP DISORDER-UNSPEC 08/25/2009  . FATIGUE 08/25/2009  . Cervicalgia 08/14/2007  . Depression 03/15/2007  . HLD (hyperlipidemia) 12/18/2006  . Essential hypertension 12/18/2006  . ALLERGIC RHINITIS 12/18/2006  . RENAL CALCULUS, HX OF 12/18/2006    Past Medical History:  Diagnosis Date  . ALLERGIC RHINITIS 12/18/2006   Qualifier: Diagnosis of  By: [place], [place]    . Anxiety   . Arthritis   . DEPRESSION, SITUATIONAL 03/15/2007   Qualifier: Diagnosis of  By: Wynona Luna   . GERD (gastroesophageal reflux disease)   . Headache    occasional  . History of kidney stones   . HYPERLIPIDEMIA 12/18/2006   Qualifier: History of  By: Danny Lawless CMA, [place]    . Hypertension   . HYPERTENSION 12/18/2006   Qualifier: Diagnosis of  By: Danny Lawless CMA, [place]    . INSOMNIA-SLEEP DISORDER-UNSPEC 08/25/2009   Qualifier: Diagnosis of  By: Jenny Reichmann MD, Hunt Oris   . Pain in knee region after total knee replacement ([place]) 04/03/2012  . Pneumonia   . RENAL CALCULUS, HX OF 12/18/2006   Qualifier: Diagnosis of  By: Danny Lawless CMA, [place]      Family History  Problem Relation Age of Onset  . Stroke Mother   . Heart disease Father   . Lung cancer Sister   . Lung cancer Brother   . Healthy Daughter   . Healthy Daughter   . Healthy Daughter    Past Surgical History:  Procedure Laterality Date  . CERVICAL DISC SURGERY     x 3  . CONVERSION TO TOTAL KNEE Left 10/09/2016   Procedure: Conversion left uni compartment arthroplasty to total knee arthroplasty;  Surgeon: Paralee Cancel, MD;  Location: [facility];  Service: Orthopedics;  Laterality: Left;  90 mins  . KNEE ARTHROSCOPY     partial  . total left knee arthoplasty     10/09/16 Dr. Alvan Dame   Social History   Social History Narrative  . Not on file    Immunization History  Administered Date(s) Administered  . Influenza Whole 11/12/2012  . Influenza,inj,Quad PF,6+ Mos 12/11/2017, 12/25/2018  . Influenza-Unspecified 12/15/2013, 12/12/2015, 11/29/2016  . Td 11/03/2002  . Tdap 09/23/2015  . Zoster 08/12/2012  . Zoster Recombinat (Shingrix) 03/22/2018, 08/20/2018     Objective: Vital Signs: There were no vitals taken for this visit.   Physical Exam   Musculoskeletal Exam: ***  CDAI Exam: CDAI Score: - Patient Global: -; Provider Global: - Swollen: -; Tender: - Joint Exam   No joint exam has been documented for this visit   There is currently no information documented on the homunculus. Go to the Rheumatology activity and complete the homunculus joint exam.  Investigation: No additional findings.  Imaging: No results found.  Recent Labs: Lab Results  Component Value Date   WBC 6.4 01/15/2019   HGB 10.8 (L) 01/15/2019   PLT 298 01/15/2019   NA 139 01/15/2019   K 3.9 01/15/2019   CL 102 01/15/2019   CO2 29 01/15/2019   GLUCOSE 101 (H) 01/15/2019   BUN 8 01/15/2019   CREATININE 0.89 01/15/2019   BILITOT 0.4 01/15/2019   ALKPHOS 74 12/25/2018   AST 15 01/15/2019   ALT 20 01/15/2019   PROT 7.0 01/15/2019   ALBUMIN 4.3 12/25/2018   CALCIUM 9.4 01/15/2019   GFRAA 79 01/15/2019   QFTBGOLDPLUS NEGATIVE 04/26/2018    Speciality Comments: No specialty comments available.  Procedures:  No procedures performed Allergies: Patient has no known allergies.   Assessment / Plan:     Visit Diagnoses: No diagnosis found.  Orders: No orders of the defined types were placed in this encounter.  No orders of the defined types were placed in this encounter.   Face-to-face time spent with patient was *** minutes. Greater than 50% of time was spent in counseling and coordination of care.  Follow-Up Instructions: No follow-ups on file.   Earnestine Mealing, CMA  Note - This record has been created using Editor, commissioning.   Chart creation errors have been sought, but may not always  have been located. Such creation errors do not reflect on  the standard of medical care.

## 2019-02-09 ENCOUNTER — Other Ambulatory Visit: Payer: Self-pay | Admitting: Gastroenterology

## 2019-02-10 ENCOUNTER — Other Ambulatory Visit: Payer: Self-pay | Admitting: Gastroenterology

## 2019-02-11 ENCOUNTER — Ambulatory Visit: Payer: Medicare HMO | Admitting: Rheumatology

## 2019-02-18 ENCOUNTER — Telehealth: Payer: Self-pay | Admitting: Rheumatology

## 2019-02-18 DIAGNOSIS — M0579 Rheumatoid arthritis with rheumatoid factor of multiple sites without organ or systems involvement: Secondary | ICD-10-CM

## 2019-02-18 DIAGNOSIS — Z79899 Other long term (current) drug therapy: Secondary | ICD-10-CM

## 2019-02-18 NOTE — Telephone Encounter (Signed)
Last Visit: 01/15/2019  Next Visit:message sent to the front desk to schedule.  Labs: 01/15/2019 Should be okay for her to resume methotrexate. We should repeat labs in 2 weeks. She may also add Plaquenil as planned. Please clarify that she should not take any oral methotrexate and stay only with subcutaneous methotrexate.   Advised patient she needs updated labs, patient verbalized understanding and will get labs drawn today. Orders have been released for [facility].

## 2019-02-18 NOTE — Telephone Encounter (Signed)
Patient left a message requesting a refill on her MTX  sent to her pharmacy [facility] on [address].

## 2019-02-19 DIAGNOSIS — H5213 Myopia, bilateral: Secondary | ICD-10-CM | POA: Diagnosis not present

## 2019-02-19 LAB — COMPLETE METABOLIC PANEL WITH GFR
AG Ratio: 1.7 (calc) (ref 1.0–2.5)
ALT: 36 U/L — ABNORMAL HIGH (ref 6–29)
AST: 28 U/L (ref 10–35)
Albumin: 4.4 g/dL (ref 3.6–5.1)
Alkaline phosphatase (APISO): 65 U/L (ref 37–153)
BUN: 13 mg/dL (ref 7–25)
CO2: 24 mmol/L (ref 20–32)
Calcium: 9.7 mg/dL (ref 8.6–10.4)
Chloride: 103 mmol/L (ref 98–110)
Creat: 0.82 mg/dL (ref 0.50–0.99)
GFR, Est African American: 88 mL/min/{1.73_m2} (ref >=60)
GFR, Est Non African American: 76 mL/min/{1.73_m2} (ref >=60)
Globulin: 2.6 g/dL (calc) (ref 1.9–3.7)
Glucose, Bld: 97 mg/dL (ref 65–139)
Potassium: 3.5 mmol/L (ref 3.5–5.3)
Sodium: 139 mmol/L (ref 135–146)
Total Bilirubin: 0.5 mg/dL (ref 0.2–1.2)
Total Protein: 7 g/dL (ref 6.1–8.1)

## 2019-02-19 LAB — CBC WITH DIFFERENTIAL/PLATELET
Absolute Monocytes: 504 cells/uL (ref 200–950)
Basophils Absolute: 51 cells/uL (ref 0–200)
Basophils Relative: 0.7 %
Eosinophils Absolute: 190 cells/uL (ref 15–500)
Eosinophils Relative: 2.6 %
HCT: 34 % — ABNORMAL LOW (ref 35.0–45.0)
Hemoglobin: 11.6 g/dL — ABNORMAL LOW (ref 11.7–15.5)
Lymphs Abs: 1015 cells/uL (ref 850–3900)
MCH: 28.4 pg (ref 27.0–33.0)
MCHC: 34.1 g/dL (ref 32.0–36.0)
MCV: 83.3 fL (ref 80.0–100.0)
MPV: 11.1 fL (ref 7.5–12.5)
Monocytes Relative: 6.9 %
Neutro Abs: 5541 cells/uL (ref 1500–7800)
Neutrophils Relative %: 75.9 %
Platelets: 271 10*3/uL (ref 140–400)
RBC: 4.08 10*6/uL (ref 3.80–5.10)
RDW: 13.5 % (ref 11.0–15.0)
Total Lymphocyte: 13.9 %
WBC: 7.3 10*3/uL (ref 3.8–10.8)

## 2019-02-19 MED ORDER — METHOTREXATE SODIUM CHEMO INJECTION 50 MG/2ML: 20.0000 mg | INTRAMUSCULAR | 0 refills | Status: DC

## 2019-02-19 NOTE — Telephone Encounter (Signed)
Ok to refill MTX

## 2019-02-19 NOTE — Telephone Encounter (Signed)
Patient had eye exam completed at Dr. Irven Shelling 02/19/2019,RF on MTX, [facility] on [address]

## 2019-02-19 NOTE — Telephone Encounter (Signed)
ALT is borderline elevated.  Please advise patient to avoid taking NSAIDs, tylenol, or alcohol.  We will continue to monitor. Rest of CMP WNL.  Hgb and Hct remain borderline low but are improving.  Rest of CBC WNL.

## 2019-02-19 NOTE — Telephone Encounter (Signed)
Prescription has been sent to the pharmacy, patient verbalized understanding.   Will document eye exam in chart once received.

## 2019-03-03 ENCOUNTER — Telehealth: Payer: Self-pay | Admitting: Rheumatology

## 2019-03-03 NOTE — Telephone Encounter (Signed)
-----   Message from [place] sent at 02/18/2019 11:45 AM EST ----- Please call to schedule follow up, patient is due 02/2019. Thanks!

## 2019-03-03 NOTE — Telephone Encounter (Signed)
LMOM for patient to call and schedule follow-up appointment.   °

## 2019-03-21 ENCOUNTER — Telehealth: Payer: Self-pay | Admitting: Rheumatology

## 2019-03-21 NOTE — Telephone Encounter (Signed)
Attempted to contact patient and left message on machine to advise patient she is due for labs the beginning of March 2021.

## 2019-03-21 NOTE — Telephone Encounter (Signed)
Patient left a voicemail requesting a return call to let her know when she is due for labwork.   

## 2019-03-26 ENCOUNTER — Encounter: Payer: Self-pay | Admitting: Rheumatology

## 2019-03-28 ENCOUNTER — Other Ambulatory Visit: Payer: Self-pay | Admitting: Internal Medicine

## 2019-03-28 NOTE — Telephone Encounter (Signed)
Done erx 

## 2019-04-10 ENCOUNTER — Other Ambulatory Visit: Payer: Self-pay | Admitting: Gastroenterology

## 2019-04-11 ENCOUNTER — Other Ambulatory Visit: Payer: Self-pay | Admitting: Internal Medicine

## 2019-04-17 DIAGNOSIS — H401131 Primary open-angle glaucoma, bilateral, mild stage: Secondary | ICD-10-CM | POA: Diagnosis not present

## 2019-04-17 DIAGNOSIS — Z0100 Encounter for examination of eyes and vision without abnormal findings: Secondary | ICD-10-CM | POA: Diagnosis not present

## 2019-04-21 ENCOUNTER — Telehealth: Payer: Self-pay | Admitting: Rheumatology

## 2019-04-21 NOTE — Telephone Encounter (Signed)
Returned patient call. No answer.  Left voicemail stating I received a message about her having difficulty with one of her medications and called to discuss.  Advised the I was working remotely today and would call again tomorrow when I am in office.  She was approved for Rasuvo through insurance but it was expensive.  She wanted to try vial and syringe first as it was more affordable.   Next best option would be for patient to apply for Rasuvo patient assistance.   Mariella Saa, PharmD, [place], [place] Clinical Specialty Pharmacist [phone]  04/21/2019 11:29 AM

## 2019-04-21 NOTE — Telephone Encounter (Signed)
Patient would like a call back to discuss injections she is taking. Per patient, she is bruising at the injection site due to needles being so long, and having a lot of pain. Please call to advise.

## 2019-04-22 NOTE — Telephone Encounter (Signed)
Called patient to discuss issues with injectable methotrexate.  Patient states that she believes the needles are too long and it is painful when she injects and she is bruising.  She is injecting in her abdominal area only.  She also mentioned that she is having increased joint pain.  Patient was last seen in our office in November and was to follow-up in 4 weeks.  Advised patient that she needs to schedule a follow-up appointment patient verbalized understanding.  Advised patient to try injecting in her upper thigh.  Reminded patient to "pinch an inch" and to avoid any scars, stretch marks, and veins.  Patient verbalized understanding.  We will discuss patient assistance at next follow-up appointment.  Patient transferred to the front desk to schedule appointment.  Mariella Saa, PharmD, [place], [place] Clinical Specialty Pharmacist [phone]  04/22/2019 10:17 AM

## 2019-04-22 NOTE — Progress Notes (Signed)
Office Visit Note  Patient: Pamela [name]             Date of Birth: Mar 02, 1956           MRN: [id number]             PCP: Biagio Borg, MD Referring: Biagio Borg, MD Visit Date: 04/24/2019 Occupation: @GUAROCC @  Subjective:  Pain in legs.   History of Present Illness: Pamela [name] is a 64 y.o. female with history of seropositive rheumatoid arthritis and osteoarthritis.  Was a started on subcu methotrexate few months back.  She states initially the medication was effective but now she is having pain in her bilateral lower extremities.  She describes pain in her muscles.  She denies any joint swelling or joint pain.  She denies any discomfort in her knee joints.  Her left knee is replaced.  She continues to have discomfort in the left trochanteric area.  Patient states she was recently diagnosed with glaucoma.  Activities of Daily Living:  Patient reports morning stiffness for all day hours.   Patient Denies nocturnal pain.  Difficulty dressing/grooming: Denies Difficulty climbing stairs: Reports Difficulty getting out of chair: Reports Difficulty using hands for taps, buttons, cutlery, and/or writing: Denies  Review of Systems  Constitutional: Positive for fatigue. Negative for night sweats, weight gain and weight loss.  HENT: Negative for mouth sores, trouble swallowing, trouble swallowing, mouth dryness and nose dryness.   Eyes: Positive for dryness. Negative for pain, redness and visual disturbance.  Respiratory: Negative for cough, shortness of breath and difficulty breathing.   Cardiovascular: Negative for chest pain, palpitations, hypertension, irregular heartbeat and swelling in legs/feet.  Gastrointestinal: Negative for blood in stool, constipation and diarrhea.  Endocrine: Negative for increased urination.  Genitourinary: Negative for vaginal dryness.  Musculoskeletal: Positive for myalgias, morning stiffness and myalgias. Negative for arthralgias, joint pain, joint  swelling, muscle weakness and muscle tenderness.  Skin: Negative for color change, rash, hair loss, skin tightness, ulcers and sensitivity to sunlight.  Allergic/Immunologic: Negative for susceptible to infections.  Neurological: Negative for dizziness, memory loss, night sweats and weakness.  Hematological: Negative for swollen glands.  Psychiatric/Behavioral: Positive for depressed mood. Negative for sleep disturbance. The patient is not nervous/anxious.     PMFS History:  Patient Active Problem List   Diagnosis Date Noted  . History of glaucoma 04/24/2019  . Bursitis of left hip 12/25/2018  . Wellness examination 12/25/2018  . MGUS (monoclonal gammopathy of unknown significance) 05/11/2018  . Rheumatoid arthritis involving multiple sites with positive rheumatoid factor ([place]) 05/03/2018  . High risk medication use 05/03/2018  . Status post total left knee replacement 05/03/2018  . DDD (degenerative disc disease), lumbar 05/03/2018  . Primary osteoarthritis of right knee 04/05/2018  . 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 Left rotator cuff tear 12/19/2017  . Left wrist pain 12/12/2017  . Left shoulder pain 08/22/2017  . Chronic pain of right knee 08/22/2017  . Acute pain of right knee 04/17/2017  . Pain of left calf 04/13/2017  . 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 Hyperglycemia 09/23/2015  . Anxiety state 09/23/2015  . Abnormal urine odor 08/25/2015  . GERD (gastroesophageal reflux disease) 08/25/2015  . Upper abdominal pain 04/16/2015  . Early satiety 04/16/2015  . Dysuria 04/16/2015  . Preventative health care 06/18/2014  . Greater trochanteric bursitis of right hip 01/06/2013  . Left lumbar radiculitis 04/03/2012  . Eczema 04/03/2012  . 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  BURSITIS, RIGHT HIP 08/25/2009  . INSOMNIA-SLEEP DISORDER-UNSPEC 08/25/2009  . FATIGUE 08/25/2009  . Cervicalgia 08/14/2007  . Depression 03/15/2007  . HLD (hyperlipidemia) 12/18/2006  . Essential  hypertension 12/18/2006  . ALLERGIC RHINITIS 12/18/2006  . RENAL CALCULUS, HX OF 12/18/2006    Past Medical History:  Diagnosis Date  . ALLERGIC RHINITIS 12/18/2006   Qualifier: Diagnosis of  By: [place], [place]    . Anxiety   . Arthritis   . DEPRESSION, SITUATIONAL 03/15/2007   Qualifier: Diagnosis of  By: Wynona Luna   . GERD (gastroesophageal reflux disease)   . Headache    occasional  . History of kidney stones   . HYPERLIPIDEMIA 12/18/2006   Qualifier: History of  By: Danny Lawless CMA, [place]    . Hypertension   . HYPERTENSION 12/18/2006   Qualifier: Diagnosis of  By: Danny Lawless CMA, [place]    . INSOMNIA-SLEEP DISORDER-UNSPEC 08/25/2009   Qualifier: Diagnosis of  By: Jenny Reichmann MD, Hunt Oris   . Pain in knee region after total knee replacement ([place]) 04/03/2012  . Pneumonia   . RENAL CALCULUS, HX OF 12/18/2006   Qualifier: Diagnosis of  By: Danny Lawless CMA, [place]      Family History  Problem Relation Age of Onset  . Stroke Mother   . Heart disease Father   . Lung cancer Sister   . Lung cancer Brother   . Healthy Daughter   . Healthy Daughter   . Healthy Daughter    Past Surgical History:  Procedure Laterality Date  . CERVICAL DISC SURGERY     x 3  . CONVERSION TO TOTAL KNEE Left 10/09/2016   Procedure: Conversion left uni compartment arthroplasty to total knee arthroplasty;  Surgeon: Paralee Cancel, MD;  Location: [facility];  Service: Orthopedics;  Laterality: Left;  90 mins  . KNEE ARTHROSCOPY     partial  . total left knee arthoplasty     10/09/16 Dr. Alvan Dame   Social History   Social History Narrative  . Not on file   Immunization History  Administered Date(s) Administered  . Influenza Whole 11/12/2012  . Influenza,inj,Quad PF,6+ Mos 12/11/2017, 12/25/2018  . Influenza-Unspecified 12/15/2013, 12/12/2015, 11/29/2016  . Td 11/03/2002  . Tdap 09/23/2015  . Zoster 08/12/2012  . Zoster Recombinat (Shingrix) 03/22/2018, 08/20/2018     Objective: Vital Signs: BP (!) 140/105  (BP Location: Left Arm, Patient Position: Sitting, Cuff Size: Small)   Pulse (!) 112   Resp 13   Ht 5' 5.5" (1.664 m)   Wt 242 lb (109.8 kg)   BMI 39.66 kg/m    Physical Exam Vitals and nursing note reviewed.  Constitutional:      Appearance: She is well-developed.  HENT:     Head: Normocephalic and atraumatic.  Eyes:     Conjunctiva/sclera: Conjunctivae normal.  Cardiovascular:     Rate and Rhythm: Normal rate and regular rhythm.     Heart sounds: Normal heart sounds.  Pulmonary:     Effort: Pulmonary effort is normal.     Breath sounds: Normal breath sounds.  Abdominal:     General: Bowel sounds are normal.     Palpations: Abdomen is soft.  Musculoskeletal:     Cervical back: Normal range of motion.  Lymphadenopathy:     Cervical: No cervical adenopathy.  Skin:    General: Skin is warm and dry.     Capillary Refill: Capillary refill takes less than 2 seconds.  Neurological:     Mental Status: She is alert  and oriented to person, place, and time.  Psychiatric:        Behavior: Behavior normal.      Musculoskeletal Exam: C-spine was in good range of motion.  She has some discomfort range of motion of her left shoulder joint.  Elbow joints wrist joints MCPs PIPs DIPs with good range of motion with no synovitis.  She had mild tenderness over left trochanteric area.  Hip joints and knee joints with good range of motion with no synovitis.  Ankle joints with good range of motion.  She has no MTP tenderness.  CDAI Exam: CDAI Score: 0.4  Patient Global: 2 mm; Provider Global: 2 mm Swollen: 0 ; Tender: 0  Joint Exam 04/24/2019   No joint exam has been documented for this visit   There is currently no information documented on the homunculus. Go to the Rheumatology activity and complete the homunculus joint exam.  Investigation: No additional findings.  Imaging: No results found.  Recent Labs: Lab Results  Component Value Date   WBC 7.3 02/18/2019   HGB 11.6 (L)  02/18/2019   PLT 271 02/18/2019   NA 139 02/18/2019   K 3.5 02/18/2019   CL 103 02/18/2019   CO2 24 02/18/2019   GLUCOSE 97 02/18/2019   BUN 13 02/18/2019   CREATININE 0.82 02/18/2019   BILITOT 0.5 02/18/2019   ALKPHOS 74 12/25/2018   AST 28 02/18/2019   ALT 36 (H) 02/18/2019   PROT 7.0 02/18/2019   ALBUMIN 4.3 12/25/2018   CALCIUM 9.7 02/18/2019   GFRAA 88 02/18/2019   QFTBGOLDPLUS NEGATIVE 04/26/2018    Speciality Comments: No specialty comments available.  Procedures:  No procedures performed Allergies: Patient has no known allergies.   Assessment / Plan:     Visit Diagnoses: Rheumatoid arthritis involving multiple sites with positive rheumatoid factor (HCC) - ANA 1:160 Cytoplasmic,RF 116, sed rate 22.  Patient is clinically doing well with no synovitis on examination today.  Her main concern is lower extremity muscle pain.  She has no calf tenderness on examination.  Her left knee joint is replaced which is doing well.  She has no discomfort range of motion of her right knee.  High risk medication use - Methotrexate 0.8 ml every 7 days, folic acid 1 mg 2 tablets daily.  We will check labs today and then every 3 months to monitor for drug toxicity.- Plan: CBC with Differential/Platelet, COMPLETE METABOLIC PANEL WITH GFR  Primary osteoarthritis of right knee-she has no discomfort today.  S/P left TK revision-doing well.  Trochanteric bursitis, left hip-she has some tenderness over left trochanteric area.  She has been encouraged to do IT band exercises.  Traumatic incomplete tear of left rotator cuff, subsequent encounter-she has limited painful range of motion.  Myalgia -she complains of lower extremity muscle spasms.  Plan: CK, TSH  Vitamin D deficiency-her vitamin D was low in October.  She states she never did pick up the prescription.  Have advised her to start on vitamin D.  Sometimes vitamin D deficiency can cause myalgias.  DDD (degenerative disc disease),  lumbar-she denies any radiculopathy.  She had good range of motion of her lumbar spine.  Anxiety and depression-she is on Celexa.  History of hyperlipidemia  Essential hypertension-patient's diastolic blood pressure was elevated today.  She states she did take her blood pressure medication.  We have advised her to contact her PCP as soon as possible.  History of gastroesophageal reflux (GERD)  Other proteinuria  History of glaucoma-recent  diagnosis.  Patient states she is using some eyedrops.  Orders: Orders Placed This Encounter  Procedures  . CBC with Differential/Platelet  . COMPLETE METABOLIC PANEL WITH GFR  . CK  . TSH   No orders of the defined types were placed in this encounter.   Face-to-face time spent with patient was 30 minutes. Greater than 50% of time was spent in counseling and coordination of care.  Follow-Up Instructions: Return in about 3 months (around 07/22/2019) for Rheumatoid arthritis, Osteoarthritis.   Bo Merino, MD  Note - This record has been created using Editor, commissioning.  Chart creation errors have been sought, but may not always  have been located. Such creation errors do not reflect on  the standard of medical care.

## 2019-04-23 ENCOUNTER — Ambulatory Visit: Payer: Medicare HMO | Admitting: Rheumatology

## 2019-04-24 ENCOUNTER — Ambulatory Visit (INDEPENDENT_AMBULATORY_CARE_PROVIDER_SITE_OTHER): Payer: Medicare HMO | Admitting: Rheumatology

## 2019-04-24 ENCOUNTER — Other Ambulatory Visit: Payer: Self-pay

## 2019-04-24 ENCOUNTER — Encounter: Payer: Self-pay | Admitting: Rheumatology

## 2019-04-24 VITALS — BP 140/105 | HR 112 | Resp 13 | Ht 65.5 in | Wt 242.0 lb

## 2019-04-24 DIAGNOSIS — M0579 Rheumatoid arthritis with rheumatoid factor of multiple sites without organ or systems involvement: Secondary | ICD-10-CM | POA: Diagnosis not present

## 2019-04-24 DIAGNOSIS — F329 Major depressive disorder, single episode, unspecified: Secondary | ICD-10-CM

## 2019-04-24 DIAGNOSIS — Z8639 Personal history of other endocrine, nutritional and metabolic disease: Secondary | ICD-10-CM

## 2019-04-24 DIAGNOSIS — Z79899 Other long term (current) drug therapy: Secondary | ICD-10-CM | POA: Diagnosis not present

## 2019-04-24 DIAGNOSIS — E559 Vitamin D deficiency, unspecified: Secondary | ICD-10-CM

## 2019-04-24 DIAGNOSIS — F419 Anxiety disorder, unspecified: Secondary | ICD-10-CM

## 2019-04-24 DIAGNOSIS — S46012D Strain of muscle(s) and tendon(s) of the rotator cuff of left shoulder, subsequent encounter: Secondary | ICD-10-CM

## 2019-04-24 DIAGNOSIS — R808 Other proteinuria: Secondary | ICD-10-CM

## 2019-04-24 DIAGNOSIS — I10 Essential (primary) hypertension: Secondary | ICD-10-CM

## 2019-04-24 DIAGNOSIS — Z96652 Presence of left artificial knee joint: Secondary | ICD-10-CM

## 2019-04-24 DIAGNOSIS — M5136 Other intervertebral disc degeneration, lumbar region: Secondary | ICD-10-CM | POA: Diagnosis not present

## 2019-04-24 DIAGNOSIS — R69 Illness, unspecified: Secondary | ICD-10-CM | POA: Diagnosis not present

## 2019-04-24 DIAGNOSIS — M7062 Trochanteric bursitis, left hip: Secondary | ICD-10-CM

## 2019-04-24 DIAGNOSIS — M791 Myalgia, unspecified site: Secondary | ICD-10-CM | POA: Diagnosis not present

## 2019-04-24 DIAGNOSIS — Z8719 Personal history of other diseases of the digestive system: Secondary | ICD-10-CM

## 2019-04-24 DIAGNOSIS — M1711 Unilateral primary osteoarthritis, right knee: Secondary | ICD-10-CM

## 2019-04-24 DIAGNOSIS — Z8669 Personal history of other diseases of the nervous system and sense organs: Secondary | ICD-10-CM

## 2019-04-24 NOTE — Patient Instructions (Signed)
Standing Labs We placed an order today for your standing lab work.    Please come back and get your standing labs in May and every 3 months   We have open lab daily Monday through Thursday from 8:30-12:30 PM and 1:30-4:30 PM and Friday from 8:30-12:30 PM and 1:30-4:00 PM at the office of Dr. Donoven Pett.   You may experience shorter wait times on Monday and Friday afternoons. The office is located at [address], [address], [place], [place] [address] No appointment is necessary.   Labs are drawn by [facility].  You may receive a bill from [facility] for your lab work.  If you wish to have your labs drawn at another location, please call the office 24 hours in advance to send orders.  If you have any questions regarding directions or hours of operation,  please call [phone].   Just as a reminder please drink plenty of water prior to coming for your lab work. Thanks!   

## 2019-04-25 ENCOUNTER — Other Ambulatory Visit: Payer: Self-pay | Admitting: Internal Medicine

## 2019-04-25 ENCOUNTER — Telehealth: Payer: Self-pay | Admitting: Rheumatology

## 2019-04-25 DIAGNOSIS — M0579 Rheumatoid arthritis with rheumatoid factor of multiple sites without organ or systems involvement: Secondary | ICD-10-CM

## 2019-04-25 LAB — CBC WITH DIFFERENTIAL/PLATELET
Absolute Monocytes: 525 cells/uL (ref 200–950)
Basophils Absolute: 83 cells/uL (ref 0–200)
Basophils Relative: 1.3 %
Eosinophils Absolute: 314 cells/uL (ref 15–500)
Eosinophils Relative: 4.9 %
HCT: 37.1 % (ref 35.0–45.0)
Hemoglobin: 12.6 g/dL (ref 11.7–15.5)
Lymphs Abs: 1069 cells/uL (ref 850–3900)
MCH: 28.6 pg (ref 27.0–33.0)
MCHC: 34 g/dL (ref 32.0–36.0)
MCV: 84.1 fL (ref 80.0–100.0)
MPV: 10.6 fL (ref 7.5–12.5)
Monocytes Relative: 8.2 %
Neutro Abs: 4410 cells/uL (ref 1500–7800)
Neutrophils Relative %: 68.9 %
Platelets: 314 10*3/uL (ref 140–400)
RBC: 4.41 10*6/uL (ref 3.80–5.10)
RDW: 12.8 % (ref 11.0–15.0)
Total Lymphocyte: 16.7 %
WBC: 6.4 10*3/uL (ref 3.8–10.8)

## 2019-04-25 LAB — COMPLETE METABOLIC PANEL WITH GFR
AG Ratio: 1.4 (calc) (ref 1.0–2.5)
ALT: 43 U/L — ABNORMAL HIGH (ref 6–29)
AST: 35 U/L (ref 10–35)
Albumin: 4.3 g/dL (ref 3.6–5.1)
Alkaline phosphatase (APISO): 72 U/L (ref 37–153)
BUN: 13 mg/dL (ref 7–25)
CO2: 29 mmol/L (ref 20–32)
Calcium: 9.7 mg/dL (ref 8.6–10.4)
Chloride: 103 mmol/L (ref 98–110)
Creat: 0.86 mg/dL (ref 0.50–0.99)
GFR, Est African American: 83 mL/min/{1.73_m2} (ref >=60)
GFR, Est Non African American: 71 mL/min/{1.73_m2} (ref >=60)
Globulin: 3 g/dL (calc) (ref 1.9–3.7)
Glucose, Bld: 82 mg/dL (ref 65–99)
Potassium: 3.8 mmol/L (ref 3.5–5.3)
Sodium: 140 mmol/L (ref 135–146)
Total Bilirubin: 0.5 mg/dL (ref 0.2–1.2)
Total Protein: 7.3 g/dL (ref 6.1–8.1)

## 2019-04-25 LAB — CK: Total CK: 107 U/L (ref 29–143)

## 2019-04-25 LAB — TSH: TSH: 1.39 mIU/L (ref 0.40–4.50)

## 2019-04-25 MED ORDER — METHOTREXATE SODIUM CHEMO INJECTION 50 MG/2ML: 17.5000 mg | INTRAMUSCULAR | 0 refills | Status: DC

## 2019-04-25 MED ORDER — VITAMIN D (CHOLECALCIFEROL) 50 MCG (2000 UT) PO CAPS: 1.0000 | ORAL_CAPSULE | Freq: Every day | ORAL | 3 refills | Status: DC

## 2019-04-25 NOTE — Telephone Encounter (Signed)
       1. Which medications need to be refilled? (please list name of each medication and dose if known) Vitamin D, Ergocalciferol, (DRISDOL) 1.25 MG (50000 UT) CAPS capsule  2. Which pharmacy/location (including street and city if local pharmacy) is medication to be sent to?[facility] [phone] - [place], [place] AT [address]  3. Do they need a 30 day or 90 day supply?[place]

## 2019-04-25 NOTE — Telephone Encounter (Signed)
Per Dr. Estanislado Pandy, Her LFTs are mildly elevated. Patient may reduce methotrexate to 0.7 mL subcu weekly. We will monitor labs in 3 months as planned.  Advised patient to contact Dr. Jenny Reichmann in regards to the vitamin D prescription since we did not check vitamin D and he last prescribed it. Patient verbalized understanding.

## 2019-04-25 NOTE — Telephone Encounter (Signed)
Please change to OTC Vitamin D3 at 2000 units per day, indefinitely.  

## 2019-04-25 NOTE — Telephone Encounter (Signed)
Dr. Jenny Reichmann should she continue the 50000 units of Vit D or change to Vit D3?

## 2019-04-25 NOTE — Telephone Encounter (Signed)
Attempted to contact patient and left message on machine to advise patient to call the office.  

## 2019-04-25 NOTE — Progress Notes (Signed)
Patient was clinically doing well yesterday.  Her LFTs are mildly elevated.  Patient may reduce methotrexate to 0.7 mL subcu weekly.  We will monitor labs in 3 months as planned.

## 2019-04-25 NOTE — Telephone Encounter (Signed)
erx completed as requested. Medication list updated as well.

## 2019-04-25 NOTE — Telephone Encounter (Signed)
Patient requests a refill on MTX, and Vit D. Sent to [facility] on [address]. Patient thought it was going to be sent in yesterday after appointment, but she has not been notified by pharmacy.

## 2019-04-28 ENCOUNTER — Other Ambulatory Visit: Payer: Self-pay | Admitting: Pharmacist

## 2019-04-28 DIAGNOSIS — M0579 Rheumatoid arthritis with rheumatoid factor of multiple sites without organ or systems involvement: Secondary | ICD-10-CM

## 2019-04-28 MED ORDER — "INSULIN SYRINGE-NEEDLE U-100 28G X 5/16"" 1 ML MISC": 3 refills | Status: DC

## 2019-04-28 NOTE — Progress Notes (Signed)
Patient requested shorter needle to inject MTX.

## 2019-04-30 ENCOUNTER — Other Ambulatory Visit: Payer: Self-pay | Admitting: Internal Medicine

## 2019-04-30 NOTE — Telephone Encounter (Signed)
Done erx 

## 2019-05-02 ENCOUNTER — Other Ambulatory Visit: Payer: Self-pay | Admitting: Gastroenterology

## 2019-05-15 DIAGNOSIS — H401132 Primary open-angle glaucoma, bilateral, moderate stage: Secondary | ICD-10-CM | POA: Diagnosis not present

## 2019-06-05 ENCOUNTER — Other Ambulatory Visit: Payer: Self-pay

## 2019-06-05 MED ORDER — METOCLOPRAMIDE HCL 10 MG PO TABS: 10.0000 mg | ORAL_TABLET | Freq: Two times a day (BID) | ORAL | 0 refills | Status: DC

## 2019-06-11 ENCOUNTER — Telehealth: Payer: Self-pay | Admitting: Rheumatology

## 2019-06-11 NOTE — Telephone Encounter (Signed)
Attempted to contact the patient and unable to leave a message. Prescription for syringes sent in on 04/28/19 for 12 syringes and 3 additional refills.

## 2019-06-11 NOTE — Telephone Encounter (Signed)
Patient left a voicemail requesting prescription refill of Syringes.

## 2019-06-11 NOTE — Telephone Encounter (Signed)
Patient returned call to the office and has not contacted pharmacy for refill. Patient advised to contact pharmacy as she should have 3 additional refills on her syringes. Patient verbalized understanding.

## 2019-06-14 ENCOUNTER — Other Ambulatory Visit: Payer: Self-pay | Admitting: Internal Medicine

## 2019-06-16 ENCOUNTER — Other Ambulatory Visit: Payer: Self-pay

## 2019-06-16 MED ORDER — PANTOPRAZOLE SODIUM 40 MG PO TBEC: DELAYED_RELEASE_TABLET | ORAL | 0 refills | Status: DC

## 2019-06-16 NOTE — Telephone Encounter (Signed)
Done erx 

## 2019-06-18 DIAGNOSIS — R69 Illness, unspecified: Secondary | ICD-10-CM | POA: Diagnosis not present

## 2019-06-25 ENCOUNTER — Ambulatory Visit: Payer: Medicare HMO | Admitting: Internal Medicine

## 2019-07-01 ENCOUNTER — Other Ambulatory Visit: Payer: Self-pay | Admitting: Gastroenterology

## 2019-07-01 ENCOUNTER — Telehealth: Payer: Self-pay

## 2019-07-01 ENCOUNTER — Other Ambulatory Visit: Payer: Self-pay

## 2019-07-01 ENCOUNTER — Other Ambulatory Visit: Payer: Self-pay | Admitting: Internal Medicine

## 2019-07-01 DIAGNOSIS — I10 Essential (primary) hypertension: Secondary | ICD-10-CM

## 2019-07-01 MED ORDER — FOLIC ACID 1 MG PO TABS: 2.0000 mg | ORAL_TABLET | Freq: Every day | ORAL | 3 refills | Status: DC

## 2019-07-01 MED ORDER — LOSARTAN POTASSIUM 50 MG PO TABS: 50.0000 mg | ORAL_TABLET | Freq: Every day | ORAL | 3 refills | Status: DC

## 2019-07-01 NOTE — Telephone Encounter (Signed)
Refill request received via fax from [facility] on [address] for folic acid.   Last Visit: 04/24/2019 Next Visit: 07/23/2019  Okay to refill per Dr. Estanislado Pandy.

## 2019-07-01 NOTE — Telephone Encounter (Signed)
Please refill as per office routine med refill policy (all routine meds refilled for 3 mo or monthly per pt preference up to one year from last visit, then month to month grace period for 3 mo, then further med refills will have to be denied)  

## 2019-07-02 ENCOUNTER — Ambulatory Visit: Payer: Medicare HMO | Admitting: Internal Medicine

## 2019-07-04 ENCOUNTER — Ambulatory Visit: Payer: Medicare HMO | Admitting: Internal Medicine

## 2019-07-08 ENCOUNTER — Other Ambulatory Visit: Payer: Self-pay

## 2019-07-08 ENCOUNTER — Encounter: Payer: Self-pay | Admitting: Internal Medicine

## 2019-07-08 ENCOUNTER — Ambulatory Visit (INDEPENDENT_AMBULATORY_CARE_PROVIDER_SITE_OTHER): Payer: Medicare HMO | Admitting: Internal Medicine

## 2019-07-08 VITALS — BP 152/100 | HR 101 | Temp 98.9°F | Ht 65.5 in | Wt 246.0 lb

## 2019-07-08 DIAGNOSIS — Z0001 Encounter for general adult medical examination with abnormal findings: Secondary | ICD-10-CM | POA: Diagnosis not present

## 2019-07-08 DIAGNOSIS — R739 Hyperglycemia, unspecified: Secondary | ICD-10-CM

## 2019-07-08 DIAGNOSIS — E611 Iron deficiency: Secondary | ICD-10-CM

## 2019-07-08 DIAGNOSIS — E782 Mixed hyperlipidemia: Secondary | ICD-10-CM

## 2019-07-08 DIAGNOSIS — Z0000 Encounter for general adult medical examination without abnormal findings: Secondary | ICD-10-CM | POA: Diagnosis not present

## 2019-07-08 DIAGNOSIS — E559 Vitamin D deficiency, unspecified: Secondary | ICD-10-CM | POA: Diagnosis not present

## 2019-07-08 DIAGNOSIS — E785 Hyperlipidemia, unspecified: Secondary | ICD-10-CM | POA: Diagnosis not present

## 2019-07-08 DIAGNOSIS — M25561 Pain in right knee: Secondary | ICD-10-CM

## 2019-07-08 DIAGNOSIS — E538 Deficiency of other specified B group vitamins: Secondary | ICD-10-CM | POA: Diagnosis not present

## 2019-07-08 DIAGNOSIS — F329 Major depressive disorder, single episode, unspecified: Secondary | ICD-10-CM

## 2019-07-08 DIAGNOSIS — I10 Essential (primary) hypertension: Secondary | ICD-10-CM

## 2019-07-08 DIAGNOSIS — J309 Allergic rhinitis, unspecified: Secondary | ICD-10-CM

## 2019-07-08 DIAGNOSIS — L509 Urticaria, unspecified: Secondary | ICD-10-CM | POA: Diagnosis not present

## 2019-07-08 DIAGNOSIS — F32A Depression, unspecified: Secondary | ICD-10-CM

## 2019-07-08 DIAGNOSIS — R69 Illness, unspecified: Secondary | ICD-10-CM | POA: Diagnosis not present

## 2019-07-08 LAB — CBC WITH DIFFERENTIAL/PLATELET
Basophils Absolute: 0.1 10*3/uL (ref 0.0–0.1)
Basophils Relative: 1.3 % (ref 0.0–3.0)
Eosinophils Absolute: 0.4 10*3/uL (ref 0.0–0.7)
Eosinophils Relative: 8.2 % — ABNORMAL HIGH (ref 0.0–5.0)
HCT: 35.3 % — ABNORMAL LOW (ref 36.0–46.0)
Hemoglobin: 11.6 g/dL — ABNORMAL LOW (ref 12.0–15.0)
Lymphocytes Relative: 19 % (ref 12.0–46.0)
Lymphs Abs: 1 10*3/uL (ref 0.7–4.0)
MCHC: 33 g/dL (ref 30.0–36.0)
MCV: 84.6 fl (ref 78.0–100.0)
Monocytes Absolute: 0.7 10*3/uL (ref 0.1–1.0)
Monocytes Relative: 12.6 % — ABNORMAL HIGH (ref 3.0–12.0)
Neutro Abs: 3.2 10*3/uL (ref 1.4–7.7)
Neutrophils Relative %: 58.9 % (ref 43.0–77.0)
Platelets: 286 10*3/uL (ref 150.0–400.0)
RBC: 4.17 Mil/uL (ref 3.87–5.11)
RDW: 14 % (ref 11.5–15.5)
WBC: 5.3 10*3/uL (ref 4.0–10.5)

## 2019-07-08 LAB — BASIC METABOLIC PANEL
BUN: 17 mg/dL (ref 6–23)
CO2: 29 mEq/L (ref 19–32)
Calcium: 9.4 mg/dL (ref 8.4–10.5)
Chloride: 103 mEq/L (ref 96–112)
Creatinine, Ser: 0.84 mg/dL (ref 0.40–1.20)
GFR: 82.36 mL/min (ref >60.00)
Glucose, Bld: 91 mg/dL (ref 70–99)
Potassium: 3.9 mEq/L (ref 3.5–5.1)
Sodium: 139 mEq/L (ref 135–145)

## 2019-07-08 LAB — LIPID PANEL
Cholesterol: 171 mg/dL (ref 0–200)
HDL: 69.1 mg/dL (ref >39.00)
LDL Cholesterol: 86 mg/dL (ref 0–99)
NonHDL: 101.5
Total CHOL/HDL Ratio: 2
Triglycerides: 76 mg/dL (ref 0.0–149.0)
VLDL: 15.2 mg/dL (ref 0.0–40.0)

## 2019-07-08 LAB — IBC PANEL
Iron: 98 ug/dL (ref 42–145)
Saturation Ratios: 25.6 % (ref 20.0–50.0)
Transferrin: 273 mg/dL (ref 212.0–360.0)

## 2019-07-08 LAB — HEPATIC FUNCTION PANEL
ALT: 63 U/L — ABNORMAL HIGH (ref 0–35)
AST: 44 U/L — ABNORMAL HIGH (ref 0–37)
Albumin: 4.1 g/dL (ref 3.5–5.2)
Alkaline Phosphatase: 74 U/L (ref 39–117)
Bilirubin, Direct: 0.1 mg/dL (ref 0.0–0.3)
Total Bilirubin: 0.3 mg/dL (ref 0.2–1.2)
Total Protein: 7.3 g/dL (ref 6.0–8.3)

## 2019-07-08 LAB — VITAMIN B12: Vitamin B-12: 584 pg/mL (ref 211–911)

## 2019-07-08 LAB — HEMOGLOBIN A1C: Hgb A1c MFr Bld: 5.5 % (ref 4.6–6.5)

## 2019-07-08 LAB — VITAMIN D 25 HYDROXY (VIT D DEFICIENCY, FRACTURES): VITD: 20.37 ng/mL — ABNORMAL LOW (ref 30.00–100.00)

## 2019-07-08 LAB — TSH: TSH: 1.23 u[IU]/mL (ref 0.35–4.50)

## 2019-07-08 MED ORDER — ALBUTEROL SULFATE HFA 108 (90 BASE) MCG/ACT IN AERS: 2.0000 | INHALATION_SPRAY | Freq: Four times a day (QID) | RESPIRATORY_TRACT | 5 refills | Status: DC | PRN

## 2019-07-08 MED ORDER — LOSARTAN POTASSIUM 100 MG PO TABS: 100.0000 mg | ORAL_TABLET | Freq: Every day | ORAL | 3 refills | Status: DC

## 2019-07-08 MED ORDER — PREDNISONE 10 MG PO TABS: ORAL_TABLET | ORAL | 0 refills | Status: DC

## 2019-07-08 MED ORDER — SERTRALINE HCL 100 MG PO TABS: 100.0000 mg | ORAL_TABLET | Freq: Every day | ORAL | 3 refills | Status: DC

## 2019-07-08 NOTE — Assessment & Plan Note (Signed)
Also to improve with predpac asd 

## 2019-07-08 NOTE — Assessment & Plan Note (Signed)
Uncontrolled, for increased losartan 100 qd

## 2019-07-08 NOTE — Assessment & Plan Note (Signed)
Pt has stopped celexa as did not seen to help, for zoloft 100 qd, declines counseling referral

## 2019-07-08 NOTE — Assessment & Plan Note (Signed)

## 2019-07-08 NOTE — Assessment & Plan Note (Addendum)
Mild worsening, likely needs further cortison, to f/u Dr Alvan Dame  I spent 15minutes in addition to time for CPX wellness examination in preparing to see the patient by review of recent labs, imaging and procedures, obtaining and reviewing separately obtained history, communicating with the patient and family or caregiver, ordering medications, tests or procedures, and documenting clinical information in the EHR including the differential Dx, treatment, and any further evaluation and other management of right knee pain, depression, hive, HLD, hyperglycemia, allergies, HTN

## 2019-07-08 NOTE — Assessment & Plan Note (Signed)
Also for predpac asd, also albutrol HFA Prn

## 2019-07-08 NOTE — Progress Notes (Signed)
Subjective:    Patient ID: Pamela [name], female    DOB: Jan 01, 1956, 64 y.o.   MRN: [id number]  HPI  Here for wellness and f/u;  Overall doing ok;  Pt denies Chest pain, worsening SOB, DOE, wheezing, orthopnea, PND, worsening LE edema, palpitations, dizziness or syncope.  Pt denies neurological change such as new headache, facial or extremity weakness.  Pt denies polydipsia, polyuria, or low sugar symptoms. Pt states overall good compliance with treatment and medications, good tolerability, and has been trying to follow appropriate diet.  Pt has also had mild worsening depressive symptoms, but no suicidal ideation or panic. No fever, night sweats, wt loss, loss of appetite, or other constitutional symptoms.  Pt states good ability with ADL's, has low to mod fall risk and did fall with right knee giveaway x 3 wks ago, home safety reviewed and adequate, no other significant changes in hearing or vision, and only occasionally active with exercise.  Gained wt with pandemic.  aAlso with hives today, which is mild recurrent for several years, with itching, and sometimes mild sob Wt Readings from Last 3 Encounters:  07/08/19 246 lb (111.6 kg)  04/24/19 242 lb (109.8 kg)  01/15/19 242 lb (109.8 kg)   Past Medical History:  Diagnosis Date  . ALLERGIC RHINITIS 12/18/2006   Qualifier: Diagnosis of  By: [place], [place]    . Anxiety   . Arthritis   . DEPRESSION, SITUATIONAL 03/15/2007   Qualifier: Diagnosis of  By: Wynona Luna   . GERD (gastroesophageal reflux disease)   . Headache    occasional  . History of kidney stones   . HYPERLIPIDEMIA 12/18/2006   Qualifier: History of  By: Danny Lawless CMA, [place]    . Hypertension   . HYPERTENSION 12/18/2006   Qualifier: Diagnosis of  By: Danny Lawless CMA, [place]    . INSOMNIA-SLEEP DISORDER-UNSPEC 08/25/2009   Qualifier: Diagnosis of  By: Jenny Reichmann MD, Hunt Oris   . Pain in knee region after total knee replacement ([place]) 04/03/2012  . Pneumonia   . RENAL CALCULUS, HX  OF 12/18/2006   Qualifier: Diagnosis of  By: [place], [place]     Past Surgical History:  Procedure Laterality Date  . CERVICAL DISC SURGERY     x 3  . CONVERSION TO TOTAL KNEE Left 10/09/2016   Procedure: Conversion left uni compartment arthroplasty to total knee arthroplasty;  Surgeon: Paralee Cancel, MD;  Location: [facility];  Service: Orthopedics;  Laterality: Left;  90 mins  . KNEE ARTHROSCOPY     partial  . total left knee arthoplasty     10/09/16 Dr. Alvan Dame    reports that she quit smoking about 51 years ago. Her smoking use included cigarettes. She has a 0.75 pack-year smoking history. She has never used smokeless tobacco. She reports current alcohol use. She reports that she does not use drugs. family history includes Healthy in her daughter, daughter, and daughter; Heart disease in her father; Lung cancer in her brother and sister; Stroke in her mother. Allergies  Allergen Reactions  . Cymbalta [Duloxetine Hcl] Other (See Comments)    Feeling weird   Current Outpatient Medications on File Prior to Visit  Medication Sig Dispense Refill  . ALPRAZolam (XANAX) 0.25 MG tablet TAKE 1 TABLET BY MOUTH TWICE DAILY AS NEEDED 60 tablet 2  . B-D TB SYRINGE 1CC/27GX1/2" 27G X 1/2" 1 ML MISC USE ONCE WEEKLY    . folic acid (FOLVITE) 1 MG tablet Take 2 tablets (2  mg total) by mouth daily. 180 tablet 3  . gabapentin (NEURONTIN) 300 MG capsule Take 300 mg by mouth 2 (two) times daily.   0  . Insulin Syringe-Needle U-100 28G X 5/16" 1 ML MISC Use 1 syringe every 7 days to inject methotrexate. 12 each 3  . methotrexate 50 MG/2ML injection Inject 0.7 mLs (17.5 mg total) into the skin once a week. 10 mL 0  . metoCLOPramide (REGLAN) 10 MG tablet TAKE 1 TABLET BY MOUTH TWICE DAILY MORNING AND BEDTIME 30 tablet 0  . pantoprazole (PROTONIX) 40 MG tablet TAKE 1 TABLET(40 MG) BY MOUTH DAILY 90 tablet 0  . traMADol (ULTRAM) 50 MG tablet TAKE 1 TABLET BY MOUTH EVERY 6 HOURS AS NEEDED FOR PAIN 120 tablet 2  .  Vitamin D, Cholecalciferol, 50 MCG (2000 UT) CAPS Take 1 capsule by mouth daily. 90 capsule 3  . XALATAN 0.005 % ophthalmic solution 1 drop at bedtime.    Marland Kitchen zolpidem (AMBIEN) 10 MG tablet TAKE 1 TABLET BY MOUTH AT BEDTIME FOR SLEEP 90 tablet 1  . atorvastatin (LIPITOR) 20 MG tablet Take 1 tablet (20 mg total) by mouth daily. 90 tablet 0  . hydrochlorothiazide (MICROZIDE) 12.5 MG capsule Take 1 capsule (12.5 mg total) by mouth daily. 90 capsule 3   No current facility-administered medications on file prior to visit.   Review of Systems All otherwise neg per pt     Objective:   Physical Exam BP (!) 152/100 (BP Location: Left Arm, Patient Position: Sitting, Cuff Size: Large)   Pulse (!) 101   Temp 98.9 F (37.2 C) (Oral)   Ht 5' 5.5" (1.664 m)   Wt 246 lb (111.6 kg)   SpO2 96%   BMI 40.31 kg/m  VS noted,  Constitutional: Pt appears in NAD HENT: Head: NCAT.  Right Ear: External ear normal.  Left Ear: External ear normal.  Eyes: . Pupils are equal, round, and reactive to light. Conjunctivae and EOM are normal Nose: without d/c or deformity Neck: Neck supple. Gross normal ROM Cardiovascular: Normal rate and regular rhythm.   Pulmonary/Chest: Effort normal and breath sounds without rales or wheezing.  Abd:  Soft, NT, ND, + BS, no organomegaly Right knee with marked bony degnerative changes Neurological: Pt is alert. At baseline orientation, motor grossly intact Skin: Skin is warm. + hive like rashes to abdomen, other new lesions, no LE edema Psychiatric: Pt behavior is normal without agitation , depressed anxious All otherwise neg per pt Lab Results  Component Value Date   WBC 6.4 04/24/2019   HGB 12.6 04/24/2019   HCT 37.1 04/24/2019   PLT 314 04/24/2019   GLUCOSE 82 04/24/2019   CHOL 179 12/25/2018   TRIG 131.0 12/25/2018   HDL 71.00 12/25/2018   LDLDIRECT 109.9 04/03/2012   LDLCALC 82 12/25/2018   ALT 43 (H) 04/24/2019   AST 35 04/24/2019   NA 140 04/24/2019   K 3.8  04/24/2019   CL 103 04/24/2019   CREATININE 0.86 04/24/2019   BUN 13 04/24/2019   CO2 29 04/24/2019   TSH 1.39 04/24/2019   INR 1.0 06/29/2008   HGBA1C 5.5 12/25/2018      Assessment & Plan:

## 2019-07-08 NOTE — Assessment & Plan Note (Signed)
stable overall by history and exam, recent data reviewed with pt, and pt to continue medical treatment as before,  to f/u any worsening symptoms or concerns  

## 2019-07-08 NOTE — Patient Instructions (Signed)
Please take all new medication as prescribed - the zoloft 100 mg per day  Please call in 3-4 weeks if you feel you need a higher dose of zoloft  Please take all new medication as prescribed - the prednisone, and inhaler as needee  Ok to increase the losartan to 100 mg per day  Please continue all other medications as before, and refills have been done if requested.  Please have the pharmacy call with any other refills you may need.  Please continue your efforts at being more active, low cholesterol diet, and weight control.  You are otherwise up to date with prevention measures today.  Please keep your appointments with your specialists as you may have planned  Please go to the LAB at the blood drawing area for the tests to be done  You will be contacted by phone if any changes need to be made immediately.  Otherwise, you will receive a letter about your results with an explanation, but please check with MyChart first.  Please remember to sign up for MyChart if you have not done so, as this will be important to you in the future with finding out test results, communicating by private email, and scheduling acute appointments online when needed.  Please make an Appointment to return in 6 months, or sooner if needed

## 2019-07-09 ENCOUNTER — Encounter: Payer: Self-pay | Admitting: Internal Medicine

## 2019-07-09 ENCOUNTER — Other Ambulatory Visit: Payer: Self-pay | Admitting: Internal Medicine

## 2019-07-09 LAB — URINALYSIS, ROUTINE W REFLEX MICROSCOPIC
Bilirubin Urine: NEGATIVE
Hgb urine dipstick: NEGATIVE
Ketones, ur: NEGATIVE
Leukocytes,Ua: NEGATIVE
Nitrite: NEGATIVE
RBC / HPF: NONE SEEN (ref 0–?)
Specific Gravity, Urine: 1.015 (ref 1.000–1.030)
Total Protein, Urine: NEGATIVE
Urine Glucose: NEGATIVE
Urobilinogen, UA: 0.2 (ref 0.0–1.0)
pH: 5.5 (ref 5.0–8.0)

## 2019-07-09 MED ORDER — VITAMIN D (ERGOCALCIFEROL) 1.25 MG (50000 UNIT) PO CAPS: 50000.0000 [IU] | ORAL_CAPSULE | ORAL | 0 refills | Status: DC

## 2019-07-11 ENCOUNTER — Other Ambulatory Visit: Payer: Self-pay

## 2019-07-11 MED ORDER — VITAMIN D (ERGOCALCIFEROL) 1.25 MG (50000 UNIT) PO CAPS: 50000.0000 [IU] | ORAL_CAPSULE | ORAL | 0 refills | Status: DC

## 2019-07-16 NOTE — Progress Notes (Signed)
Office Visit Note  Patient: Pamela [name]             Date of Birth: 26-Sep-1955           MRN: [id number]             PCP: Biagio Borg, MD Referring: Biagio Borg, MD Visit Date: 07/23/2019 Occupation: @GUAROCC @  Subjective:  Discuss MTX dose  History of Present Illness: SHELVIE DURAZO is a 64 y.o. female with history of seropositive rheumatoid arthritis and osteoarthritis.  Patient is on methotrexate 0.7 mL subcutaneously injections once weekly and folic acid 2 mg by mouth daily.  She reduced the dose of methotrexate from 0.8-0.7 in February 2021 due to elevated LFTs.  According to the patient she takes Advil about twice a week for pain relief and drinks 1-2 beers on occasion.  According to the patient she experiences increased joint pain and stiffness on day 6 of each week.  She denies any joint swelling currently.  She says she experiences occasional right knee joint pain but states that her left knee replacement is doing well.  She states that her shoulder joint pain has improved.  She denies any lower back pain currently.    Activities of Daily Living:  Patient reports morning stiffness for 5-10 minutes.   Patient Denies nocturnal pain.  Difficulty dressing/grooming: Denies Difficulty climbing stairs: Denies Difficulty getting out of chair: Denies Difficulty using hands for taps, buttons, cutlery, and/or writing: Denies  Review of Systems  Constitutional: Negative for fatigue.  HENT: Negative for mouth sores, mouth dryness and nose dryness.   Eyes: Negative for pain, itching, visual disturbance and dryness.  Respiratory: Negative for cough, hemoptysis and difficulty breathing.   Cardiovascular: Negative for chest pain, palpitations, hypertension and swelling in legs/feet.  Gastrointestinal: Negative for blood in stool, constipation and diarrhea.  Endocrine: Negative for increased urination.  Genitourinary: Negative for difficulty urinating and painful urination.    Musculoskeletal: Positive for myalgias, morning stiffness, muscle tenderness and myalgias. Negative for arthralgias, joint pain, joint swelling and muscle weakness.  Skin: Negative for color change, pallor, rash, hair loss, nodules/bumps, redness, skin tightness, ulcers and sensitivity to sunlight.  Allergic/Immunologic: Negative for susceptible to infections.  Neurological: Negative for dizziness, numbness, headaches and memory loss.  Hematological: Negative for swollen glands.  Psychiatric/Behavioral: Negative for depressed mood, confusion and sleep disturbance. The patient is not nervous/anxious.     PMFS History:  Patient Active Problem List   Diagnosis Date Noted  . Hives 07/08/2019  . History of glaucoma 04/24/2019  . Bursitis of left hip 12/25/2018  . MGUS (monoclonal gammopathy of unknown significance) 05/11/2018  . Rheumatoid arthritis involving multiple sites with positive rheumatoid factor ([place]) 05/03/2018  . High risk medication use 05/03/2018  . Status post total left knee replacement 05/03/2018  . DDD (degenerative disc disease), lumbar 05/03/2018  . Primary osteoarthritis of right knee 04/05/2018  . 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 Left rotator cuff tear 12/19/2017  . Left wrist pain 12/12/2017  . Left shoulder pain 08/22/2017  . Chronic pain of right knee 08/22/2017  . Acute pain of right knee 04/17/2017  . Pain of left calf 04/13/2017  . 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 Hyperglycemia 09/23/2015  . Anxiety state 09/23/2015  . Abnormal urine odor 08/25/2015  . GERD (gastroesophageal reflux disease) 08/25/2015  . Upper abdominal pain 04/16/2015  . Early satiety 04/16/2015  . Dysuria 04/16/2015  . Encounter for well adult exam  with abnormal findings 06/18/2014  . Greater trochanteric bursitis of right hip 01/06/2013  . Left lumbar radiculitis 04/03/2012  . Eczema 04/03/2012  . 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 BURSITIS, RIGHT HIP 08/25/2009  . INSOMNIA-SLEEP  DISORDER-UNSPEC 08/25/2009  . FATIGUE 08/25/2009  . Cervicalgia 08/14/2007  . Depression 03/15/2007  . HLD (hyperlipidemia) 12/18/2006  . Essential hypertension 12/18/2006  . Allergic rhinitis 12/18/2006  . RENAL CALCULUS, HX OF 12/18/2006    Past Medical History:  Diagnosis Date  . ALLERGIC RHINITIS 12/18/2006   Qualifier: Diagnosis of  By: [place], [place]    . Anxiety   . Arthritis   . DEPRESSION, SITUATIONAL 03/15/2007   Qualifier: Diagnosis of  By: Wynona Luna   . GERD (gastroesophageal reflux disease)   . Headache    occasional  . History of kidney stones   . HYPERLIPIDEMIA 12/18/2006   Qualifier: History of  By: Danny Lawless CMA, [place]    . Hypertension   . HYPERTENSION 12/18/2006   Qualifier: Diagnosis of  By: Danny Lawless CMA, [place]    . INSOMNIA-SLEEP DISORDER-UNSPEC 08/25/2009   Qualifier: Diagnosis of  By: Jenny Reichmann MD, Hunt Oris   . Pain in knee region after total knee replacement ([place]) 04/03/2012  . Pneumonia   . RENAL CALCULUS, HX OF 12/18/2006   Qualifier: Diagnosis of  By: Danny Lawless CMA, [place]      Family History  Problem Relation Age of Onset  . Stroke Mother   . Heart disease Father   . Lung cancer Sister   . Lung cancer Brother   . Healthy Daughter   . Healthy Daughter   . Healthy Daughter    Past Surgical History:  Procedure Laterality Date  . CERVICAL DISC SURGERY     x 3  . CONVERSION TO TOTAL KNEE Left 10/09/2016   Procedure: Conversion left uni compartment arthroplasty to total knee arthroplasty;  Surgeon: Paralee Cancel, MD;  Location: [facility];  Service: Orthopedics;  Laterality: Left;  90 mins  . KNEE ARTHROSCOPY     partial  . total left knee arthoplasty     10/09/16 Dr. Alvan Dame   Social History   Social History Narrative  . Not on file   Immunization History  Administered Date(s) Administered  . Influenza Whole 11/12/2012  . Influenza,inj,Quad PF,6+ Mos 12/11/2017, 12/25/2018  . Influenza-Unspecified 12/15/2013, 12/12/2015, 11/29/2016  . Td  11/03/2002  . Tdap 09/23/2015  . Zoster 08/12/2012  . Zoster Recombinat (Shingrix) 03/22/2018, 08/20/2018     Objective: Vital Signs: BP 134/82 (BP Location: Right Arm, Patient Position: Sitting, Cuff Size: Large)   Pulse 86   Resp 12   Ht 5\' 9"  (1.753 m)   Wt 252 lb (114.3 kg)   BMI 37.21 kg/m    Physical Exam Vitals and nursing note reviewed.  Constitutional:      Appearance: She is well-developed.  HENT:     Head: Normocephalic and atraumatic.  Eyes:     Conjunctiva/sclera: Conjunctivae normal.  Pulmonary:     Effort: Pulmonary effort is normal.  Abdominal:     General: Bowel sounds are normal.     Palpations: Abdomen is soft.  Musculoskeletal:     Cervical back: Normal range of motion.  Lymphadenopathy:     Cervical: No cervical adenopathy.  Skin:    General: Skin is warm and dry.     Capillary Refill: Capillary refill takes less than 2 seconds.  Neurological:     Mental Status: She is alert and oriented to person,  place, and time.  Psychiatric:        Behavior: Behavior normal.      Musculoskeletal Exam: C-spine, thoracic spine, and lumbar spine good ROM.  Shoulder joints, elbow joints, wrist joints, MCPs, PIPs, and DIPs good ROM with no synovitis.  Tenderness of the right 2nd MCP joint.  Hip joints, ankle joints, MTPs, PIPs, and DIPs good ROM with no synovitis.  Right knee has good ROM with no warmth or effusion.  Left knee replacement has good ROM with no discomfort.  No tenderness or inflammation of ankle joints.   CDAI Exam: CDAI Score: -- Patient Global: --; Provider Global: -- Swollen: --; Tender: -- Joint Exam 07/23/2019   No joint exam has been documented for this visit   There is currently no information documented on the homunculus. Go to the Rheumatology activity and complete the homunculus joint exam.  Investigation: No additional findings.  Imaging: No results found.  Recent Labs: Lab Results  Component Value Date   WBC 5.3 07/08/2019    HGB 11.6 (L) 07/08/2019   PLT 286.0 07/08/2019   NA 139 07/08/2019   K 3.9 07/08/2019   CL 103 07/08/2019   CO2 29 07/08/2019   GLUCOSE 91 07/08/2019   BUN 17 07/08/2019   CREATININE 0.84 07/08/2019   BILITOT 0.3 07/08/2019   ALKPHOS 74 07/08/2019   AST 44 (H) 07/08/2019   ALT 63 (H) 07/08/2019   PROT 7.3 07/08/2019   ALBUMIN 4.1 07/08/2019   CALCIUM 9.4 07/08/2019   GFRAA 83 04/24/2019   QFTBGOLDPLUS NEGATIVE 04/26/2018    Speciality Comments: No specialty comments available.  Procedures:  No procedures performed Allergies: Cymbalta [duloxetine hcl]   Assessment / Plan:     Visit Diagnoses: Rheumatoid arthritis involving multiple sites with positive rheumatoid factor (HCC) - ANA 1:160 Cytoplasmic,RF 116, sed rate 22: She has no synovitis on exam.  She has tenderness of the right second MCP joint.  She has been experiencing increased pain and stiffness 1 day before each methotrexate injection.  She is injecting methotrexate 0.7 mL subcutaneously once weekly and taking folic acid 2 mg by mouth daily.  She has not missed any doses of methotrexate recently.  She reduce the dose of methotrexate after lab work obtained on 04/24/2019 which revealed ALT of 43.  She had updated lab work on 07/08/2019 which revealed AST 44 and ALT 63.  According to the patient she takes Advil about twice a week and drinks alcohol on occasion.  We discussed reducing the dose of methotrexate to 0.5 ml sq injections once weekly and adding on Plaquenil 200 mg 1 tablet by mouth twice daily.  Indications, contraindications, potential side effects of Plaquenil were discussed today.  All questions were addressed.  Consent for Plaquenil was obtained in October 2020.  She will follow-up for lab work in 1 month and then every 3 months.  She was advised to notify us if she cannot tolerate taking Plaquenil.  She will follow-up in the office in 6 weeks.  Patient was counseled on the purpose, proper use, and adverse effects of  hydroxychloroquine including nausea/diarrhea, skin rash, headaches, and sun sensitivity.  Discussed importance of annual eye exams while on hydroxychloroquine to monitor to ocular toxicity and discussed importance of frequent laboratory monitoring.  Provided patient with eye exam form for baseline ophthalmologic exam.  Provided patient with educational materials on hydroxychloroquine and answered all questions.  Patient consented to hydroxychloroquine.  Will upload consent in the media tab.  Dose will be Plaquenil 200 mg twice daily.  Prescription pending lab results.  High risk medication use - Methotrexate 0.5 ml every 7 days, folic acid 1 mg 2 tablets daily, and Plaquenil 200 mg 1 tablet by mouth twice daily.  Indications, contraindications, potential side effects of Plaquenil were discussed today.  Consent for Plaquenil was obtained in October 2020.  She will return for lab work in 1 month and every 3 months to monitor for drug toxicity.  Standing orders for CBC and CMP are in place.  Primary osteoarthritis of right knee: She experiences intermittent discomfort in the right knee joint.  She has good range of motion on exam today.  No warmth or effusion was noted.  S/P left TK revision: Doing well.  She has good ROM with no discomfort.   Trochanteric bursitis, left hip: Resolved.  No tenderness to palpation on exam.   Traumatic incomplete tear of left rotator cuff, subsequent encounter: She has good ROM with no discomfort at this time.   Myalgia: Her myalgias have improved since starting on vitamin D  50,000 units once weekly.    Vitamin D deficiency: Vitamin D was 20.37 on 07/08/2019.  She was started on vitamin D 50,000 units by mouth once weekly by her PCP.  Her myalgias have improved since starting on a vitamin D supplement.  DDD (degenerative disc disease), lumbar: She is not having any lower back pain at this time.   Other medical conditions are listed as follows:   Anxiety and  depression - she is on Celexa  Essential hypertension  History of hyperlipidemia  Other proteinuria  History of gastroesophageal reflux (GERD)  History of glaucoma    Orders: No orders of the defined types were placed in this encounter.  Meds ordered this encounter  Medications  . hydroxychloroquine (PLAQUENIL) 200 MG tablet    Sig: Take 1 tablet (200 mg total) by mouth 2 (two) times daily.    Dispense:  180 tablet    Refill:  0    Order Specific Question:   Supervising Provider    Answer:   Bo Merino [[id number]]  . methotrexate 50 MG/2ML injection    Sig: Inject 0.5 mLs (12.5 mg total) into the skin once a week.    Dispense:  10 mL    Refill:  0    Order Specific Question:   Supervising Provider    Answer:   Bo Merino [phone]    Face-to-face time spent with patient was 30 minutes. Greater than 50% of time was spent in counseling and coordination of care.  Follow-Up Instructions: Return in about 6 weeks (around 09/03/2019) for Rheumatoid arthritis, Osteoarthritis.   Leonia Reader  I examined and evaluated the patient with Hazel Sams PA.  Patient has elevated LFTs.  She has pain experiencing some discomfort before her methotrexate injection.  She had no synovitis on my examination.  We decided to start her on Plaquenil and will decrease the dose of methotrexate.  We will monitor labs.  The plan of care was discussed as noted above.  Bo Merino, MD  Note - This record has been created using Editor, commissioning.  Chart creation errors have been sought, but may not always  have been located. Such creation errors do not reflect on  the standard of medical care.

## 2019-07-17 ENCOUNTER — Telehealth: Payer: Self-pay

## 2019-07-17 MED ORDER — TRAMADOL HCL 50 MG PO TABS: ORAL_TABLET | ORAL | 2 refills | Status: DC

## 2019-07-17 NOTE — Telephone Encounter (Signed)
Done erx 

## 2019-07-17 NOTE — Telephone Encounter (Signed)
New message    1.Medication Requested:traMADol (ULTRAM) 50 MG tablet  2. Pharmacy (Name, Street, [place]): [id number] [place], [address]  3. On Med List:  Yes   4. Last Visit with PCP: 4.27.2021  5. Next visit date with PCP: 10.28.2021    Agent: Please be advised that RX refills may take up to 3 business days. We ask that you follow-up with your pharmacy.

## 2019-07-22 ENCOUNTER — Telehealth: Payer: Self-pay | Admitting: Internal Medicine

## 2019-07-22 MED ORDER — GABAPENTIN 300 MG PO CAPS
300.0000 mg | ORAL_CAPSULE | Freq: Two times a day (BID) | ORAL | 1 refills | Status: DC
Start: 2019-07-22 — End: 2021-12-19

## 2019-07-22 MED ORDER — TRAMADOL HCL 50 MG PO TABS: ORAL_TABLET | ORAL | 2 refills | Status: DC

## 2019-07-22 NOTE — Telephone Encounter (Signed)
Done erx 

## 2019-07-22 NOTE — Telephone Encounter (Signed)
New message:   1.Medication Requested: traMADol (ULTRAM) 50 MG tablet gabapentin (NEURONTIN) 300 MG capsule 2. Pharmacy (Name, Street, [place]): [id number] [place], [place] 3. On Med List: yes  4. Last Visit with PCP: 07/08/19  5. Next visit date with PCP: 01/08/20   Agent: Please be advised that RX refills may take up to 3 business days. We ask that you follow-up with your pharmacy.

## 2019-07-23 ENCOUNTER — Other Ambulatory Visit: Payer: Self-pay

## 2019-07-23 ENCOUNTER — Encounter: Payer: Self-pay | Admitting: Physician Assistant

## 2019-07-23 ENCOUNTER — Ambulatory Visit: Payer: Medicare HMO | Admitting: Rheumatology

## 2019-07-23 VITALS — BP 134/82 | HR 86 | Resp 12 | Ht 69.0 in | Wt 252.0 lb

## 2019-07-23 DIAGNOSIS — Z79899 Other long term (current) drug therapy: Secondary | ICD-10-CM

## 2019-07-23 DIAGNOSIS — R808 Other proteinuria: Secondary | ICD-10-CM

## 2019-07-23 DIAGNOSIS — S46012D Strain of muscle(s) and tendon(s) of the rotator cuff of left shoulder, subsequent encounter: Secondary | ICD-10-CM | POA: Diagnosis not present

## 2019-07-23 DIAGNOSIS — Z8639 Personal history of other endocrine, nutritional and metabolic disease: Secondary | ICD-10-CM

## 2019-07-23 DIAGNOSIS — M791 Myalgia, unspecified site: Secondary | ICD-10-CM

## 2019-07-23 DIAGNOSIS — Z96652 Presence of left artificial knee joint: Secondary | ICD-10-CM | POA: Diagnosis not present

## 2019-07-23 DIAGNOSIS — R69 Illness, unspecified: Secondary | ICD-10-CM | POA: Diagnosis not present

## 2019-07-23 DIAGNOSIS — F32A Depression, unspecified: Secondary | ICD-10-CM

## 2019-07-23 DIAGNOSIS — M1711 Unilateral primary osteoarthritis, right knee: Secondary | ICD-10-CM

## 2019-07-23 DIAGNOSIS — Z8669 Personal history of other diseases of the nervous system and sense organs: Secondary | ICD-10-CM

## 2019-07-23 DIAGNOSIS — E559 Vitamin D deficiency, unspecified: Secondary | ICD-10-CM

## 2019-07-23 DIAGNOSIS — M0579 Rheumatoid arthritis with rheumatoid factor of multiple sites without organ or systems involvement: Secondary | ICD-10-CM | POA: Diagnosis not present

## 2019-07-23 DIAGNOSIS — M5136 Other intervertebral disc degeneration, lumbar region: Secondary | ICD-10-CM | POA: Diagnosis not present

## 2019-07-23 DIAGNOSIS — Z8719 Personal history of other diseases of the digestive system: Secondary | ICD-10-CM

## 2019-07-23 DIAGNOSIS — F329 Major depressive disorder, single episode, unspecified: Secondary | ICD-10-CM

## 2019-07-23 DIAGNOSIS — M7062 Trochanteric bursitis, left hip: Secondary | ICD-10-CM | POA: Diagnosis not present

## 2019-07-23 DIAGNOSIS — F419 Anxiety disorder, unspecified: Secondary | ICD-10-CM

## 2019-07-23 DIAGNOSIS — I10 Essential (primary) hypertension: Secondary | ICD-10-CM | POA: Diagnosis not present

## 2019-07-23 MED ORDER — HYDROXYCHLOROQUINE SULFATE 200 MG PO TABS: 200.0000 mg | ORAL_TABLET | Freq: Two times a day (BID) | ORAL | 0 refills | Status: DC

## 2019-07-23 MED ORDER — METHOTREXATE SODIUM CHEMO INJECTION 50 MG/2ML: 12.5000 mg | INTRAMUSCULAR | 0 refills | Status: DC

## 2019-07-23 NOTE — Patient Instructions (Addendum)
   DECREASE methotrexate to 0.5 ml every 7 days  START Plaquenil 200 mg 1 tablet twice daily  CONTINUE folic acid 2 mg daily  SCHEDULE plaquenil eye exam within the next month

## 2019-07-28 ENCOUNTER — Telehealth: Payer: Self-pay | Admitting: Internal Medicine

## 2019-07-28 NOTE — Telephone Encounter (Signed)
New message:   1.Medication Requested: zolpidem (AMBIEN) 10 MG tablet 2. Pharmacy (Name, Street, [place]): [id number] [place], [place] 3. On Med List: yes  4. Last Visit with PCP: 07/08/19  5. Next visit date with PCP: 01/08/20   Agent: Please be advised that RX refills may take up to 3 business days. We ask that you follow-up with your pharmacy.

## 2019-07-30 NOTE — Telephone Encounter (Signed)
Left voicemail for pt pertaining med refill information; informed pt that her rx has  Refill on it and it is a 3 month supply for each rx filled.

## 2019-07-31 ENCOUNTER — Telehealth: Payer: Self-pay | Admitting: Rheumatology

## 2019-07-31 ENCOUNTER — Other Ambulatory Visit: Payer: Self-pay | Admitting: Internal Medicine

## 2019-07-31 ENCOUNTER — Other Ambulatory Visit: Payer: Self-pay | Admitting: Gastroenterology

## 2019-07-31 DIAGNOSIS — M0579 Rheumatoid arthritis with rheumatoid factor of multiple sites without organ or systems involvement: Secondary | ICD-10-CM

## 2019-07-31 MED ORDER — HYDROXYCHLOROQUINE SULFATE 200 MG PO TABS: 200.0000 mg | ORAL_TABLET | Freq: Two times a day (BID) | ORAL | 0 refills | Status: DC

## 2019-07-31 NOTE — Telephone Encounter (Signed)
Patient calling to let you know she is unable to afford new medication doctor put her on. Please call to advise.

## 2019-07-31 NOTE — Telephone Encounter (Signed)
I checked [employer] and a 90 day supply of plaquenil at [facility] is $26. I advised patient and she requested prescription to be sent to [facility]. [facility]. [facility] has been sent to  [facility] and coupon has been printed for patient and is at the front desk for pick up.

## 2019-07-31 NOTE — Telephone Encounter (Signed)
Patient is calling about Plaquenil. [facility]- copay was $141. Patient is able to fill for 30 day supply at a time.

## 2019-08-01 ENCOUNTER — Telehealth: Payer: Self-pay

## 2019-08-01 NOTE — Telephone Encounter (Signed)
1.Medication Requested:  hydrochlorothiazide (MICROZIDE) 12.5 MG capsule  2. Pharmacy (Name, Street, City):[id number] [place], [place] - [address]  3. On Med List: Yes   4. Last Visit with PCP: 4.27.21   5. Next visit date with PCP: 10.28.21    Agent: Please be advised that RX refills may take up to 3 business days. We ask that you follow-up with your pharmacy.

## 2019-08-01 NOTE — Telephone Encounter (Signed)
E-Prescribing Status: Receipt confirmed by pharmacy (07/31/2019 8:25 AM EDT)

## 2019-08-02 ENCOUNTER — Other Ambulatory Visit: Payer: Self-pay | Admitting: Gastroenterology

## 2019-08-04 ENCOUNTER — Emergency Department (HOSPITAL_COMMUNITY): Payer: Medicare HMO

## 2019-08-04 ENCOUNTER — Telehealth: Payer: Self-pay | Admitting: Rheumatology

## 2019-08-04 ENCOUNTER — Other Ambulatory Visit: Payer: Self-pay

## 2019-08-04 ENCOUNTER — Observation Stay (HOSPITAL_COMMUNITY)
Admission: EM | Admit: 2019-08-04 | Discharge: 2019-08-05 | Disposition: A | Discharge status: Home / Self Care | Payer: Medicare HMO | Attending: Internal Medicine | Admitting: Internal Medicine

## 2019-08-04 ENCOUNTER — Telehealth: Payer: Self-pay

## 2019-08-04 ENCOUNTER — Encounter (HOSPITAL_COMMUNITY): Payer: Self-pay | Admitting: *Deleted

## 2019-08-04 DIAGNOSIS — Z87891 Personal history of nicotine dependence: Secondary | ICD-10-CM | POA: Diagnosis not present

## 2019-08-04 DIAGNOSIS — M419 Scoliosis, unspecified: Secondary | ICD-10-CM | POA: Diagnosis not present

## 2019-08-04 DIAGNOSIS — E161 Other hypoglycemia: Secondary | ICD-10-CM | POA: Diagnosis not present

## 2019-08-04 DIAGNOSIS — K219 Gastro-esophageal reflux disease without esophagitis: Secondary | ICD-10-CM | POA: Insufficient documentation

## 2019-08-04 DIAGNOSIS — Z981 Arthrodesis status: Secondary | ICD-10-CM | POA: Insufficient documentation

## 2019-08-04 DIAGNOSIS — M1711 Unilateral primary osteoarthritis, right knee: Secondary | ICD-10-CM | POA: Insufficient documentation

## 2019-08-04 DIAGNOSIS — Z20822 Contact with and (suspected) exposure to covid-19: Secondary | ICD-10-CM | POA: Insufficient documentation

## 2019-08-04 DIAGNOSIS — R531 Weakness: Secondary | ICD-10-CM | POA: Diagnosis not present

## 2019-08-04 DIAGNOSIS — Z8249 Family history of ischemic heart disease and other diseases of the circulatory system: Secondary | ICD-10-CM | POA: Diagnosis not present

## 2019-08-04 DIAGNOSIS — G8929 Other chronic pain: Secondary | ICD-10-CM | POA: Insufficient documentation

## 2019-08-04 DIAGNOSIS — E162 Hypoglycemia, unspecified: Secondary | ICD-10-CM | POA: Diagnosis not present

## 2019-08-04 DIAGNOSIS — M4802 Spinal stenosis, cervical region: Secondary | ICD-10-CM | POA: Insufficient documentation

## 2019-08-04 DIAGNOSIS — M5126 Other intervertebral disc displacement, lumbar region: Secondary | ICD-10-CM | POA: Insufficient documentation

## 2019-08-04 DIAGNOSIS — M2578 Osteophyte, vertebrae: Secondary | ICD-10-CM | POA: Insufficient documentation

## 2019-08-04 DIAGNOSIS — M0579 Rheumatoid arthritis with rheumatoid factor of multiple sites without organ or systems involvement: Secondary | ICD-10-CM | POA: Diagnosis not present

## 2019-08-04 DIAGNOSIS — M5134 Other intervertebral disc degeneration, thoracic region: Secondary | ICD-10-CM | POA: Insufficient documentation

## 2019-08-04 DIAGNOSIS — E785 Hyperlipidemia, unspecified: Secondary | ICD-10-CM | POA: Insufficient documentation

## 2019-08-04 DIAGNOSIS — M47816 Spondylosis without myelopathy or radiculopathy, lumbar region: Secondary | ICD-10-CM | POA: Insufficient documentation

## 2019-08-04 DIAGNOSIS — M4316 Spondylolisthesis, lumbar region: Secondary | ICD-10-CM | POA: Diagnosis not present

## 2019-08-04 DIAGNOSIS — Z888 Allergy status to other drugs, medicaments and biological substances status: Secondary | ICD-10-CM | POA: Insufficient documentation

## 2019-08-04 DIAGNOSIS — Z96652 Presence of left artificial knee joint: Secondary | ICD-10-CM | POA: Insufficient documentation

## 2019-08-04 DIAGNOSIS — S3992XA Unspecified injury of lower back, initial encounter: Secondary | ICD-10-CM | POA: Diagnosis not present

## 2019-08-04 DIAGNOSIS — Z79899 Other long term (current) drug therapy: Secondary | ICD-10-CM | POA: Insufficient documentation

## 2019-08-04 DIAGNOSIS — D472 Monoclonal gammopathy: Secondary | ICD-10-CM | POA: Insufficient documentation

## 2019-08-04 DIAGNOSIS — M48062 Spinal stenosis, lumbar region with neurogenic claudication: Secondary | ICD-10-CM | POA: Diagnosis not present

## 2019-08-04 DIAGNOSIS — S199XXA Unspecified injury of neck, initial encounter: Secondary | ICD-10-CM | POA: Diagnosis not present

## 2019-08-04 DIAGNOSIS — M48061 Spinal stenosis, lumbar region without neurogenic claudication: Secondary | ICD-10-CM | POA: Insufficient documentation

## 2019-08-04 DIAGNOSIS — R262 Difficulty in walking, not elsewhere classified: Principal | ICD-10-CM | POA: Insufficient documentation

## 2019-08-04 DIAGNOSIS — I10 Essential (primary) hypertension: Secondary | ICD-10-CM | POA: Insufficient documentation

## 2019-08-04 DIAGNOSIS — F329 Major depressive disorder, single episode, unspecified: Secondary | ICD-10-CM | POA: Diagnosis not present

## 2019-08-04 DIAGNOSIS — R0902 Hypoxemia: Secondary | ICD-10-CM | POA: Diagnosis not present

## 2019-08-04 DIAGNOSIS — R52 Pain, unspecified: Secondary | ICD-10-CM | POA: Diagnosis not present

## 2019-08-04 DIAGNOSIS — S299XXA Unspecified injury of thorax, initial encounter: Secondary | ICD-10-CM | POA: Diagnosis not present

## 2019-08-04 DIAGNOSIS — M25569 Pain in unspecified knee: Secondary | ICD-10-CM

## 2019-08-04 DIAGNOSIS — R69 Illness, unspecified: Secondary | ICD-10-CM | POA: Diagnosis not present

## 2019-08-04 DIAGNOSIS — R29898 Other symptoms and signs involving the musculoskeletal system: Secondary | ICD-10-CM

## 2019-08-04 DIAGNOSIS — F419 Anxiety disorder, unspecified: Secondary | ICD-10-CM | POA: Diagnosis not present

## 2019-08-04 DIAGNOSIS — G459 Transient cerebral ischemic attack, unspecified: Secondary | ICD-10-CM | POA: Diagnosis not present

## 2019-08-04 DIAGNOSIS — Y92009 Unspecified place in unspecified non-institutional (private) residence as the place of occurrence of the external cause: Secondary | ICD-10-CM

## 2019-08-04 LAB — URINALYSIS, ROUTINE W REFLEX MICROSCOPIC
Bacteria, UA: NONE SEEN
Bilirubin Urine: NEGATIVE
Glucose, UA: NEGATIVE mg/dL
Hgb urine dipstick: NEGATIVE
Ketones, ur: NEGATIVE mg/dL
Nitrite: NEGATIVE
Protein, ur: NEGATIVE mg/dL
Specific Gravity, Urine: 1.012 (ref 1.005–1.030)
pH: 5 (ref 5.0–8.0)

## 2019-08-04 MED ORDER — FOLIC ACID 1 MG PO TABS: 2.0000 mg | ORAL_TABLET | Freq: Every day | ORAL | 3 refills | Status: DC

## 2019-08-04 MED ORDER — HYDROCODONE-ACETAMINOPHEN 5-325 MG PO TABS
1.0000 | ORAL_TABLET | Freq: Once | ORAL | Status: AC
  Administered 2019-08-04: 1 via ORAL
  Filled 2019-08-04: qty 1

## 2019-08-04 NOTE — ED Notes (Signed)
Patient ambulated to toilet with extensive assistance .

## 2019-08-04 NOTE — Telephone Encounter (Signed)
1.Medication Requested:  hydrochlorothiazide (MICROZIDE) [id number] MG capsule  folic acid (FOLVITE) 1 MG tablet  2. Pharmacy (Name, Street, City):[id number] [place], [place] - [address]  3. On Med List: Yes   4. Last Visit with PCP: 4.27.21   5. Next visit date with PCP: 10.28.21     Agent: Please be advised that RX refills may take up to 3 business days. We ask that you follow-up with your pharmacy.

## 2019-08-04 NOTE — ED Provider Notes (Addendum)
[facility] EMERGENCY DEPARTMENT Provider Note   CSN: [id number] Arrival date & time: 08/04/19  1939     History Chief Complaint  Patient presents with  . Leg Pain  . Fall    Pamela [name] is a 64 y.o. female.  The history is provided by the patient and medical records. No language interpreter was used.  Leg Pain Fall   Pamela [name] is a 64 y.o. female who presents to the Emergency Department complaining of leg pain. She presents the emergency department complaining of one week of bilateral leg pain and bilateral lower extremity weakness. Pain is located throughout the legs and is described as a aching sensation. It is waxing and waning in nature and nonradiating. She cannot describe any alleviating or worsening factors. It is present when she lays down as well as when she stands. She was standing today at home when her legs buckled and she fell, landing on her knees. She was unable to get herself back up off the ground. She did not hit her head or lose consciousness. She has a history of rheumatoid arthritis and takes methotrexate weekly as well as plaquenil BID. She also has a history of hypertension. No reports of fevers, headache, abdominal pain, chest pain, nausea, vomiting, numbness. She does not smoke. She drinks occasional alcohol. No street drug use. No recent surgeries or dental procedures. No known sick contacts. She has not been vaccinated against the COVID-19 virus. No recent medication changes.    Past Medical History:  Diagnosis Date  . ALLERGIC RHINITIS 12/18/2006   Qualifier: Diagnosis of  By: [place], [place]    . Anxiety   . Arthritis   . DEPRESSION, SITUATIONAL 03/15/2007   Qualifier: Diagnosis of  By: Wynona Luna   . GERD (gastroesophageal reflux disease)   . Headache    occasional  . History of kidney stones   . HYPERLIPIDEMIA 12/18/2006   Qualifier: History of  By: Danny Lawless CMA, [place]    . Hypertension   . HYPERTENSION 12/18/2006    Qualifier: Diagnosis of  By: Danny Lawless CMA, [place]    . INSOMNIA-SLEEP DISORDER-UNSPEC 08/25/2009   Qualifier: Diagnosis of  By: Jenny Reichmann MD, [place]   . Pain in knee region after total knee replacement ([place]) 04/03/2012  . Pneumonia   . RENAL CALCULUS, HX OF 12/18/2006   Qualifier: Diagnosis of  By: Danny Lawless CMA, [place]      Patient Active Problem List   Diagnosis Date Noted  . Hives 07/08/2019  . History of glaucoma 04/24/2019  . Bursitis of left hip 12/25/2018  . MGUS (monoclonal gammopathy of unknown significance) 05/11/2018  . Rheumatoid arthritis involving multiple sites with positive rheumatoid factor ([place]) 05/03/2018  . High risk medication use 05/03/2018  . Status post total left knee replacement 05/03/2018  . DDD (degenerative disc disease), lumbar 05/03/2018  . Primary osteoarthritis of right knee 04/05/2018  . 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 Left rotator cuff tear 12/19/2017  . Left wrist pain 12/12/2017  . Left shoulder pain 08/22/2017  . Chronic pain of right knee 08/22/2017  . Acute pain of right knee 04/17/2017  . Pain of left calf 04/13/2017  . 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 Hyperglycemia 09/23/2015  . Anxiety state 09/23/2015  . Abnormal urine odor 08/25/2015  . GERD (gastroesophageal reflux disease) 08/25/2015  . Upper abdominal pain 04/16/2015  . Early satiety 04/16/2015  . Dysuria 04/16/2015  . Encounter for well adult exam  with abnormal findings 06/18/2014  . Greater trochanteric bursitis of right hip 01/06/2013  . Left lumbar radiculitis 04/03/2012  . Eczema 04/03/2012  . 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 BURSITIS, RIGHT HIP 08/25/2009  . INSOMNIA-SLEEP DISORDER-UNSPEC 08/25/2009  . FATIGUE 08/25/2009  . Cervicalgia 08/14/2007  . Depression 03/15/2007  . HLD (hyperlipidemia) 12/18/2006  . Essential hypertension 12/18/2006  . Allergic rhinitis 12/18/2006  . RENAL CALCULUS, HX OF 12/18/2006    Past Surgical History:  Procedure Laterality Date  .  CERVICAL DISC SURGERY     x 3  . CONVERSION TO TOTAL KNEE Left 10/09/2016   Procedure: Conversion left uni compartment arthroplasty to total knee arthroplasty;  Surgeon: Paralee Cancel, MD;  Location: [facility];  Service: Orthopedics;  Laterality: Left;  90 mins  . KNEE ARTHROSCOPY     partial  . total left knee arthoplasty     10/09/16 Dr. Alvan Dame     OB History   No obstetric history on file.     Family History  Problem Relation Age of Onset  . Stroke Mother   . Heart disease Father   . Lung cancer Sister   . Lung cancer Brother   . Healthy Daughter   . Healthy Daughter   . Healthy Daughter     Social History   Tobacco Use  . Smoking status: Former Smoker    Packs/day: 0.25    Years: 3.00    Pack years: 0.75    Types: Cigarettes    Quit date: 1970    Years since quitting: 51.4  . Smokeless tobacco: Never Used  . Tobacco comment: 1970's  Substance Use Topics  . Alcohol use: Yes    Alcohol/week: 0.0 standard drinks    Comment: occ  . Drug use: No    Home Medications Prior to Admission medications   Medication Sig Start Date End Date Taking? Authorizing Provider  albuterol (VENTOLIN HFA) 108 (90 Base) MCG/ACT inhaler Inhale 2 puffs into the lungs every 6 (six) hours as needed for wheezing or shortness of breath. 07/08/19   Biagio Borg, MD  ALPRAZolam Duanne Moron) 0.25 MG tablet TAKE 1 TABLET BY MOUTH TWICE DAILY AS NEEDED 06/16/19   Biagio Borg, MD  atorvastatin (LIPITOR) 20 MG tablet Take 1 tablet (20 mg total) by mouth daily. 01/14/19 07/23/19  Biagio Borg, MD  B-D TB SYRINGE 1CC/27GX1/2" 27G X 1/2" 1 ML MISC USE ONCE WEEKLY 06/18/19   [provider]  citalopram (CELEXA) 10 MG tablet TAKE 1 TABLET(10 MG) BY MOUTH DAILY 07/31/19   Biagio Borg, MD  folic acid (FOLVITE) 1 MG tablet Take 2 tablets (2 mg total) by mouth daily. 08/04/19   Bo Merino, MD  gabapentin (NEURONTIN) 300 MG capsule Take 1 capsule (300 mg total) by mouth 2 (two) times daily. 07/22/19   Biagio Borg, MD  hydrochlorothiazide (MICROZIDE) 12.5 MG capsule TAKE 1 CAPSULE(12.5 MG) BY MOUTH DAILY 07/31/19   Biagio Borg, MD  hydroxychloroquine (PLAQUENIL) 200 MG tablet Take 1 tablet (200 mg total) by mouth 2 (two) times daily. 07/31/19   Bo Merino, MD  Insulin Syringe-Needle U-100 28G X 5/16" 1 ML MISC Use 1 syringe every 7 days to inject methotrexate. 04/28/19   Bo Merino, MD  losartan (COZAAR) 100 MG tablet Take 1 tablet (100 mg total) by mouth daily. 07/08/19   Biagio Borg, MD  methotrexate 50 MG/2ML injection Inject 0.5 mLs (12.5 mg total) into the skin once a week. 07/23/19  Bo Merino, MD  metoCLOPramide (REGLAN) 10 MG tablet TAKE 1 TABLET BY MOUTH TWICE DAILY MORNING AND BEDTIME 07/01/19   Milus Banister, MD  pantoprazole (PROTONIX) 40 MG tablet TAKE 1 TABLET(40 MG) BY MOUTH DAILY 07/31/19   Mauri Pole, MD  predniSONE (DELTASONE) 10 MG tablet 3 tabs by mouth per day for 3 days,2tabs per day for 3 days,1tab per day for 3 days Patient not taking: Reported on 07/23/2019 07/08/19   Biagio Borg, MD  sertraline (ZOLOFT) 100 MG tablet Take 1 tablet (100 mg total) by mouth daily. 07/08/19   Biagio Borg, MD  traMADol (ULTRAM) 50 MG tablet TAKE 1 TABLET BY MOUTH EVERY 6 HOURS AS NEEDED FOR PAIN 07/22/19   Biagio Borg, MD  Vitamin D, Cholecalciferol, 50 MCG (2000 UT) CAPS Take 1 capsule by mouth daily. Patient not taking: Reported on 07/23/2019 04/25/19   Biagio Borg, MD  Vitamin D, Ergocalciferol, (DRISDOL) 1.25 MG (50000 UNIT) CAPS capsule Take 1 capsule (50,000 Units total) by mouth every 7 (seven) days. 07/11/19   Biagio Borg, MD  XALATAN 0.005 % ophthalmic solution 1 drop at bedtime. 05/15/19   [provider]  zolpidem (AMBIEN) 10 MG tablet TAKE 1 TABLET BY MOUTH AT BEDTIME FOR SLEEP 03/28/19   Biagio Borg, MD    Allergies    Cymbalta [duloxetine hcl]  Review of Systems   Review of Systems  All other systems reviewed and are  negative.   Physical Exam Updated Vital Signs BP 119/70 (BP Location: Left Arm)   Pulse 96   Temp 99.3 F (37.4 C) (Oral)   Resp 16   Wt 114.3 kg   SpO2 95%   BMI 37.21 kg/m   Physical Exam Vitals and nursing note reviewed.  Constitutional:      Appearance: She is well-developed.  HENT:     Head: Normocephalic and atraumatic.  Cardiovascular:     Rate and Rhythm: Normal rate and regular rhythm.     Heart sounds: No murmur.  Pulmonary:     Effort: Pulmonary effort is normal. No respiratory distress.     Breath sounds: Normal breath sounds.  Abdominal:     Palpations: Abdomen is soft.     Tenderness: There is no abdominal tenderness. There is no guarding or rebound.  Musculoskeletal:        General: No swelling or tenderness.     Cervical back: Neck supple.     Comments: 2+ DP pulses bilaterally. No discrete bony tenderness to palpation throughout bilateral lower extremities. There is mild tenderness to palpation over the lower thoracic and upper lumbar spine. No cervical spine tenderness to palpation  Skin:    General: Skin is warm and dry.  Neurological:     Mental Status: She is alert and oriented to person, place, and time.     Comments: Five out of five strength in all four extremities with sensation to light touch intact in all four extremities.  Absent left patellar reflex (hx/o knee replacement), 1+ right patellar reflex.    Psychiatric:        Behavior: Behavior normal.     ED Results / Procedures / Treatments   Labs (all labs ordered are listed, but only abnormal results are displayed) Labs Reviewed  URINALYSIS, ROUTINE W REFLEX MICROSCOPIC    EKG None  Radiology No results found.  Procedures Procedures (including critical care time)  Medications Ordered in ED Medications  HYDROcodone-acetaminophen (NORCO/VICODIN) 5-325 MG per  tablet 1 tablet (1 tablet Oral Given 08/04/19 2224)    ED Course  I have reviewed the triage vital signs and the  nursing notes.  Pertinent labs & imaging results that were available during my care of the patient were reviewed by me and considered in my medical decision making (see chart for details).    MDM Rules/Calculators/A&P                     patient here for evaluation of bilateral lower extremity weakness following a fall today, has been experiencing pain to bilateral legs for one week. She does not have discrete bony tenderness on examination. She has no clear weakness on strength testing while lying in the stretcher but on gait testing and she appears to have a very guarded gait and appears to have weakness in her proximal legs when walking. Imaging is negative for acute fracture. She has no pain in her neck or back. Plan to obtain MRI spine given her new onset lower extremity weakness. Patient care transferred pending MRI.  Final Clinical Impression(s) / ED Diagnoses Final diagnoses:  None    Rx / DC Orders ED Discharge Orders    None       Quintella Reichert, MD 08/05/19 0000    Quintella Reichert, MD 08/05/19 [phone]

## 2019-08-04 NOTE — Telephone Encounter (Signed)
Last Visit: 07/23/2019 Next Visit: 09/03/2019  Okay to refill per Dr. Estanislado Pandy.

## 2019-08-04 NOTE — ED Triage Notes (Signed)
Pt has had bilateral extremity weakness and pain. Pt had a fall about 6pm and had a difficult time getting back up after the fall.  Pt has hx of pain and weakness in her legs for a week.  Pt has hx of knee replacement on the left and needs one on the right.  Pt states that when she tries to stand she has increasing pain and tremor when attempting to stand.  No focal weakness and pt has no other neruo deficits. Pt reports she was having a difficulty standing due to leg pain.

## 2019-08-04 NOTE — Telephone Encounter (Signed)
Patient called requesting prescription refill of Folic Acid to be sent to her NEW PHARMACY - [facility] at [address].

## 2019-08-05 ENCOUNTER — Emergency Department (HOSPITAL_COMMUNITY): Payer: Medicare HMO

## 2019-08-05 ENCOUNTER — Observation Stay (HOSPITAL_COMMUNITY): Payer: Medicare HMO

## 2019-08-05 ENCOUNTER — Encounter (HOSPITAL_COMMUNITY): Payer: Self-pay | Admitting: Internal Medicine

## 2019-08-05 DIAGNOSIS — I10 Essential (primary) hypertension: Secondary | ICD-10-CM | POA: Diagnosis not present

## 2019-08-05 DIAGNOSIS — S299XXA Unspecified injury of thorax, initial encounter: Secondary | ICD-10-CM | POA: Diagnosis not present

## 2019-08-05 DIAGNOSIS — M48061 Spinal stenosis, lumbar region without neurogenic claudication: Secondary | ICD-10-CM | POA: Diagnosis present

## 2019-08-05 DIAGNOSIS — R262 Difficulty in walking, not elsewhere classified: Secondary | ICD-10-CM | POA: Diagnosis not present

## 2019-08-05 DIAGNOSIS — S3992XA Unspecified injury of lower back, initial encounter: Secondary | ICD-10-CM | POA: Diagnosis not present

## 2019-08-05 DIAGNOSIS — M0579 Rheumatoid arthritis with rheumatoid factor of multiple sites without organ or systems involvement: Secondary | ICD-10-CM

## 2019-08-05 DIAGNOSIS — M25562 Pain in left knee: Secondary | ICD-10-CM

## 2019-08-05 DIAGNOSIS — K219 Gastro-esophageal reflux disease without esophagitis: Secondary | ICD-10-CM | POA: Diagnosis not present

## 2019-08-05 DIAGNOSIS — S199XXA Unspecified injury of neck, initial encounter: Secondary | ICD-10-CM | POA: Diagnosis not present

## 2019-08-05 DIAGNOSIS — M25561 Pain in right knee: Secondary | ICD-10-CM | POA: Diagnosis not present

## 2019-08-05 LAB — CBC WITH DIFFERENTIAL/PLATELET
Abs Immature Granulocytes: 0.04 10*3/uL (ref 0.00–0.07)
Basophils Absolute: 0 10*3/uL (ref 0.0–0.1)
Basophils Relative: 0 %
Eosinophils Absolute: 0.2 10*3/uL (ref 0.0–0.5)
Eosinophils Relative: 2 %
HCT: 35.6 % — ABNORMAL LOW (ref 36.0–46.0)
Hemoglobin: 11.7 g/dL — ABNORMAL LOW (ref 12.0–15.0)
Immature Granulocytes: 0 %
Lymphocytes Relative: 11 %
Lymphs Abs: 1.1 10*3/uL (ref 0.7–4.0)
MCH: 27.5 pg (ref 26.0–34.0)
MCHC: 32.9 g/dL (ref 30.0–36.0)
MCV: 83.8 fL (ref 80.0–100.0)
Monocytes Absolute: 0.9 10*3/uL (ref 0.1–1.0)
Monocytes Relative: 10 %
Neutro Abs: 7.4 10*3/uL (ref 1.7–7.7)
Neutrophils Relative %: 77 %
Platelets: 241 10*3/uL (ref 150–400)
RBC: 4.25 MIL/uL (ref 3.87–5.11)
RDW: 13.7 % (ref 11.5–15.5)
WBC: 9.6 10*3/uL (ref 4.0–10.5)
nRBC: 0 % (ref 0.0–0.2)

## 2019-08-05 LAB — BASIC METABOLIC PANEL
Anion gap: 12 (ref 5–15)
BUN: 11 mg/dL (ref 8–23)
CO2: 25 mmol/L (ref 22–32)
Calcium: 9.6 mg/dL (ref 8.9–10.3)
Chloride: 101 mmol/L (ref 98–111)
Creatinine, Ser: 0.95 mg/dL (ref 0.44–1.00)
GFR calc Af Amer: >60 mL/min (ref >60)
GFR calc non Af Amer: >60 mL/min (ref >60)
Glucose, Bld: 103 mg/dL — ABNORMAL HIGH (ref 70–99)
Potassium: 3.5 mmol/L (ref 3.5–5.1)
Sodium: 138 mmol/L (ref 135–145)

## 2019-08-05 LAB — VITAMIN D 25 HYDROXY (VIT D DEFICIENCY, FRACTURES): Vit D, 25-Hydroxy: 25.88 ng/mL — ABNORMAL LOW (ref 30–100)

## 2019-08-05 LAB — HEMOGLOBIN A1C
Hgb A1c MFr Bld: 6 % — ABNORMAL HIGH (ref 4.8–5.6)
Mean Plasma Glucose: 125.5 mg/dL

## 2019-08-05 LAB — SARS CORONAVIRUS 2 BY RT PCR (HOSPITAL ORDER, PERFORMED IN ~~LOC~~ HOSPITAL LAB): SARS Coronavirus 2: NEGATIVE

## 2019-08-05 LAB — VITAMIN B12: Vitamin B-12: 469 pg/mL (ref 180–914)

## 2019-08-05 LAB — HIV ANTIBODY (ROUTINE TESTING W REFLEX): HIV Screen 4th Generation wRfx: NONREACTIVE

## 2019-08-05 LAB — FOLATE: Folate: 20.3 ng/mL (ref >5.9)

## 2019-08-05 MED ORDER — OXYCODONE-ACETAMINOPHEN 5-325 MG PO TABS
2.0000 | ORAL_TABLET | ORAL | Status: DC | PRN
  Administered 2019-08-05: 2 via ORAL
  Filled 2019-08-05: qty 2

## 2019-08-05 MED ORDER — ONDANSETRON HCL 4 MG/2ML IJ SOLN: 4.0000 mg | Freq: Four times a day (QID) | INTRAMUSCULAR | Status: DC | PRN

## 2019-08-05 MED ORDER — HYDROCODONE-ACETAMINOPHEN 5-325 MG PO TABS
1.0000 | ORAL_TABLET | Freq: Once | ORAL | Status: AC
  Administered 2019-08-05: 1 via ORAL
  Filled 2019-08-05: qty 1

## 2019-08-05 MED ORDER — SERTRALINE HCL 100 MG PO TABS: 100.0000 mg | ORAL_TABLET | Freq: Every day | ORAL | Status: DC

## 2019-08-05 MED ORDER — ACETAMINOPHEN 325 MG PO TABS: 650.0000 mg | ORAL_TABLET | Freq: Four times a day (QID) | ORAL | Status: DC | PRN

## 2019-08-05 MED ORDER — PANTOPRAZOLE SODIUM 40 MG PO TBEC
40.0000 mg | DELAYED_RELEASE_TABLET | Freq: Every day | ORAL | Status: DC
  Administered 2019-08-05: 40 mg via ORAL
  Filled 2019-08-05: qty 1

## 2019-08-05 MED ORDER — POLYETHYLENE GLYCOL 3350 17 G PO PACK: 17.0000 g | PACK | Freq: Every day | ORAL | Status: DC | PRN

## 2019-08-05 MED ORDER — ACETAMINOPHEN 650 MG RE SUPP: 650.0000 mg | Freq: Four times a day (QID) | RECTAL | Status: DC | PRN

## 2019-08-05 MED ORDER — LACTATED RINGERS IV SOLN: INTRAVENOUS | Status: AC

## 2019-08-05 MED ORDER — METOCLOPRAMIDE HCL 10 MG PO TABS
10.0000 mg | ORAL_TABLET | Freq: Two times a day (BID) | ORAL | Status: DC
  Administered 2019-08-05: 10 mg via ORAL
  Filled 2019-08-05: qty 1

## 2019-08-05 MED ORDER — LOSARTAN POTASSIUM 50 MG PO TABS
100.0000 mg | ORAL_TABLET | Freq: Every day | ORAL | Status: DC
  Administered 2019-08-05: 100 mg via ORAL
  Filled 2019-08-05: qty 2

## 2019-08-05 MED ORDER — ZOLPIDEM TARTRATE 5 MG PO TABS: 10.0000 mg | ORAL_TABLET | Freq: Every evening | ORAL | Status: DC | PRN

## 2019-08-05 MED ORDER — ATORVASTATIN CALCIUM 10 MG PO TABS
20.0000 mg | ORAL_TABLET | Freq: Every day | ORAL | Status: DC
  Administered 2019-08-05: 20 mg via ORAL
  Filled 2019-08-05: qty 2

## 2019-08-05 MED ORDER — GABAPENTIN 100 MG PO CAPS
300.0000 mg | ORAL_CAPSULE | Freq: Two times a day (BID) | ORAL | Status: DC
  Administered 2019-08-05: 300 mg via ORAL
  Filled 2019-08-05: qty 3

## 2019-08-05 MED ORDER — OXYCODONE-ACETAMINOPHEN 5-325 MG PO TABS: 1.0000 | ORAL_TABLET | ORAL | Status: DC | PRN

## 2019-08-05 MED ORDER — ALPRAZOLAM 0.25 MG PO TABS: 0.2500 mg | ORAL_TABLET | Freq: Two times a day (BID) | ORAL | Status: DC | PRN

## 2019-08-05 MED ORDER — ONDANSETRON HCL 4 MG PO TABS: 4.0000 mg | ORAL_TABLET | Freq: Four times a day (QID) | ORAL | Status: DC | PRN

## 2019-08-05 MED ORDER — HYDROCHLOROTHIAZIDE 12.5 MG PO CAPS
12.5000 mg | ORAL_CAPSULE | Freq: Every day | ORAL | Status: DC
  Administered 2019-08-05: 12.5 mg via ORAL
  Filled 2019-08-05: qty 1

## 2019-08-05 NOTE — ED Provider Notes (Signed)
Patient with RA.  Having falls and leg weakness at home.  Seen by evening team.  Dr. Ralene Bathe spoke with Dr. Leonel Ramsay and recommendation was given for MRIs of spine to look for spinal stenosis.  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  Discussed with Dr. Leonel Ramsay.  Advises against patient going home if she is falling and having weakness.  Recommends neurosurg consult for recommendations.  Discussed with Dr. Cyd Silence, who is appreciated for obs'ing patient.  Will need PT/OT consult.  Neurosurg to see patient in AM per Arnetha Massy, neurosurgery APP.   Montine Circle, PA-C 08/05/19 [id number]    Merryl Hacker, MD 08/05/19 [phone]

## 2019-08-05 NOTE — ED Notes (Signed)
Patient currently at MRI

## 2019-08-05 NOTE — ED Notes (Signed)
Lunch Tray Ordered @ 1041. 

## 2019-08-05 NOTE — ED Notes (Signed)
Reviewed discharge instructions and follow-up care with the patient. The patient verbalized understanding of both. Pt discharged.

## 2019-08-05 NOTE — ED Notes (Signed)
Pt remains in MRI at this time  

## 2019-08-05 NOTE — Consult Note (Signed)
Reason for Consult: Lumbar stenosis Referring Physician: Dr. Marylin Crosby is an 64 y.o. female.  HPI: [name] is a 64 year old female with a history significant for depression, hypertension, hyperlipidemia, left total knee replacement, and C4-C7 ACDF. She reports worsening low back pain and weakness of her lower extremities over the last three months. She fell yesterday evening, striking her right knee and was unable to ambulate after the fall. She presented to the [facility] Emergency Department, where imaging was performed. The MRI of her lumbar spine revealed severe stenosis. She denies numbness and tingling of her lower extremities. She denies bowel or bladder dysfunction. She reports subjective weakness in her lower extremities, right greater than left. She reports pain into her right anterior thigh. Her right knee pain is what is troubling her the most at present. Neurosurgery was consulted for further evaluation and recommendations regarding her lumbar spine.  Past Medical History:  Diagnosis Date  . ALLERGIC RHINITIS 12/18/2006   Qualifier: Diagnosis of  By: [place], [place]    . Anxiety   . Arthritis   . DEPRESSION, SITUATIONAL 03/15/2007   Qualifier: Diagnosis of  By: Wynona Luna   . GERD (gastroesophageal reflux disease)   . Headache    occasional  . History of kidney stones   . HYPERLIPIDEMIA 12/18/2006   Qualifier: History of  By: Danny Lawless CMA, [place]    . Hypertension   . HYPERTENSION 12/18/2006   Qualifier: Diagnosis of  By: Danny Lawless CMA, [place]    . INSOMNIA-SLEEP DISORDER-UNSPEC 08/25/2009   Qualifier: Diagnosis of  By: Jenny Reichmann MD, [place]   . Pain in knee region after total knee replacement ([place]) 04/03/2012  . Pneumonia   . RENAL CALCULUS, HX OF 12/18/2006   Qualifier: Diagnosis of  By: [place], [place]      Past Surgical History:  Procedure Laterality Date  . CERVICAL DISC SURGERY     x 3  . CONVERSION TO TOTAL KNEE Left 10/09/2016   Procedure:  Conversion left uni compartment arthroplasty to total knee arthroplasty;  Surgeon: Paralee Cancel, MD;  Location: [facility];  Service: Orthopedics;  Laterality: Left;  90 mins  . KNEE ARTHROSCOPY     partial  . total left knee arthoplasty     10/09/16 Dr. Alvan Dame    Family History  Problem Relation Age of Onset  . Stroke Mother   . Heart disease Father   . Lung cancer Sister   . Lung cancer Brother   . Healthy Daughter   . Healthy Daughter   . Healthy Daughter     Social History:  reports that she quit smoking about 51 years ago. Her smoking use included cigarettes. She has a 0.75 pack-year smoking history. She has never used smokeless tobacco. She reports current alcohol use. She reports that she does not use drugs.  Allergies:  Allergies  Allergen Reactions  . Cymbalta [Duloxetine Hcl] Other (See Comments)    Feeling weird    Medications: I have reviewed the patient's current medications.  Results for orders placed or performed during the hospital encounter of 08/04/19 (from the past 48 hour(s))  Urinalysis, Routine w reflex microscopic     Status: Abnormal   Collection Time: 08/04/19 10:26 PM  Result Value Ref Range   Color, Urine YELLOW YELLOW   APPearance HAZY (A) CLEAR   Specific Gravity, Urine 1.012 1.005 - 1.030   pH 5.0 5.0 - 8.0   Glucose, UA NEGATIVE NEGATIVE mg/dL  Hgb urine dipstick NEGATIVE NEGATIVE   Bilirubin Urine NEGATIVE NEGATIVE   Ketones, ur NEGATIVE NEGATIVE mg/dL   Protein, ur NEGATIVE NEGATIVE mg/dL   Nitrite NEGATIVE NEGATIVE   Leukocytes,Ua MODERATE (A) NEGATIVE   RBC / HPF 0-5 0 - 5 RBC/hpf   WBC, UA 6-10 0 - 5 WBC/hpf   Bacteria, UA NONE SEEN NONE SEEN   Squamous Epithelial / LPF 0-5 0 - 5   Mucus PRESENT    Hyaline Casts, UA PRESENT     Comment: Performed at [facility], [place] [address] [place], [place] [id number]  Basic metabolic panel     Status: Abnormal   Collection Time: 08/05/19 12:10 AM  Result Value Ref Range   Sodium 138 135 -  145 mmol/L   Potassium 3.5 3.5 - 5.1 mmol/L   Chloride 101 98 - 111 mmol/L   CO2 25 22 - 32 mmol/L   Glucose, Bld 103 (H) 70 - 99 mg/dL    Comment: Glucose reference range applies only to samples taken after fasting for at least 8 hours.   BUN 11 8 - 23 mg/dL   Creatinine, Ser 0.95 0.44 - 1.00 mg/dL   Calcium 9.6 8.9 - 10.3 mg/dL   GFR calc non Af Amer >60 >60 mL/min   GFR calc Af Amer >60 >60 mL/min   Anion gap 12 5 - 15    Comment: Performed at [facility] [address], [place], [place] [address]  CBC with Differential     Status: Abnormal   Collection Time: 08/05/19 12:10 AM  Result Value Ref Range   WBC 9.6 4.0 - 10.5 K/uL   RBC 4.25 3.87 - 5.11 MIL/uL   Hemoglobin 11.7 (L) 12.0 - 15.0 g/dL   HCT 35.6 (L) 36.0 - 46.0 %   MCV 83.8 80.0 - 100.0 fL   MCH 27.5 26.0 - 34.0 pg   MCHC 32.9 30.0 - 36.0 g/dL   RDW 13.7 11.5 - 15.5 %   Platelets 241 150 - 400 K/uL   nRBC 0.0 0.0 - 0.2 %   Neutrophils Relative % 77 %   Neutro Abs 7.4 1.7 - 7.7 K/uL   Lymphocytes Relative 11 %   Lymphs Abs 1.1 0.7 - 4.0 K/uL   Monocytes Relative 10 %   Monocytes Absolute 0.9 0.1 - 1.0 K/uL   Eosinophils Relative 2 %   Eosinophils Absolute 0.2 0.0 - 0.5 K/uL   Basophils Relative 0 %   Basophils Absolute 0.0 0.0 - 0.1 K/uL   Immature Granulocytes 0 %   Abs Immature Granulocytes 0.04 0.00 - 0.07 K/uL    Comment: Performed at [facility] [address], [place], [place] [address]  Hemoglobin A1c     Status: Abnormal   Collection Time: 08/05/19 12:10 AM  Result Value Ref Range   Hgb A1c MFr Bld 6.0 (H) 4.8 - 5.6 %    Comment: (NOTE) Pre diabetes:          5.7%-6.4% Diabetes:              >6.4% Glycemic control for   <7.0% adults with diabetes    Mean Plasma Glucose 125.5 mg/dL    Comment: Performed at [facility] [address], [place], [place] [address]  SARS Coronavirus 2 by RT PCR (hospital order, performed in [facility] hospital lab) Nasopharyngeal Nasopharyngeal  Swab     Status: None   Collection Time: 08/05/19  7:24 AM  Specimen: Nasopharyngeal Swab  Result Value Ref Range   SARS Coronavirus 2 NEGATIVE NEGATIVE    Comment: (NOTE) SARS-CoV-2 target nucleic acids are NOT DETECTED. The SARS-CoV-2 RNA is generally detectable in upper and lower respiratory specimens during the acute phase of infection. The lowest concentration of SARS-CoV-2 viral copies this assay can detect is 250 copies / mL. A negative result does not preclude SARS-CoV-2 infection and should not be used as the sole basis for treatment or other patient management decisions.  A negative result may occur with improper specimen collection / handling, submission of specimen other than nasopharyngeal swab, presence of viral mutation(s) within the areas targeted by this assay, and inadequate number of viral copies (<250 copies / mL). A negative result must be combined with clinical observations, patient history, and epidemiological information. Fact Sheet for Patients:   StrictlyIdeas.no Fact Sheet for Healthcare Providers: BankingDealers.co.za This test is not yet approved or cleared  by the [place] FDA and has been authorized for detection and/or diagnosis of SARS-CoV-2 by FDA under an Emergency Use Authorization (EUA).  This EUA will remain in effect (meaning this test can be used) for the duration of the COVID-19 declaration under Section 564(b)(1) of the Act, 21 U.S.C. section 360bbb-3(b)(1), unless the authorization is terminated or revoked sooner. Performed at [facility], [place] [address] [place], [place] [address]     DG Thoracic Spine 2 View  Result Date: 08/04/2019 CLINICAL DATA:  Fall EXAM: THORACIC SPINE 2 VIEWS COMPARISON:  None. FINDINGS: Vertebral body heights are maintained. Degenerative osteophytes at the mid and lower thoracic spine. IMPRESSION: Degenerative changes.  No acute osseous abnormality  Electronically Signed   By: Donavan Foil M.D.   On: 08/04/2019 23:23   DG Lumbar Spine Complete  Result Date: 08/04/2019 CLINICAL DATA:  Lower extremity weakness and pain after fall EXAM: LUMBAR SPINE - COMPLETE 4+ VIEW COMPARISON:  08/19/2011 FINDINGS: S shaped scoliosis of the thoracolumbar spine. Trace anterolisthesis L4 on L5. Vertebral body heights are maintained. Mild degenerative changes at T12-L1, L1-L2, L2-L3, L3-L4 and L5-S1. Mild facet degenerative changes of the lower lumbar spine. IMPRESSION: Mild scoliosis and degenerative changes. No acute osseous abnormality. Electronically Signed   By: Donavan Foil M.D.   On: 08/04/2019 23:25   MR Cervical Spine Wo Contrast  Result Date: 08/05/2019 CLINICAL DATA:  Fall. Difficulty standing. EXAM: MRI CERVICAL SPINE WITHOUT CONTRAST TECHNIQUE: Multiplanar, multisequence MR imaging of the cervical spine was performed. No intravenous contrast was administered. COMPARISON:  Cervical spine MRI 05/21/2005 FINDINGS: Alignment: Physiologic. Vertebrae: No fracture, evidence of discitis, or bone lesion. C4-5 ACDF with posterior instrumented fusion at C4-6. Cord: Normal signal and morphology. Posterior Fossa, vertebral arteries, paraspinal tissues: Visualized posterior fossa is normal. Vertebral artery flow voids are preserved. No prevertebral soft tissue swelling. Disc levels: C1-2: Small central disc protrusion. C2-3: Normal disc space and facet joints. There is no spinal canal stenosis. No neural foraminal stenosis. C3-4: Small left subarticular disc protrusion. Progression of mild spinal canal stenosis. No neural foraminal stenosis. C4-5: ACDF with solid arthrodesis. There is no spinal canal stenosis. No neural foraminal stenosis. C5-6: ACDF with solid arthrodesis. There is no spinal canal stenosis. No neural foraminal stenosis. C6-7: Solid anterior fusion. There is no spinal canal stenosis. No neural foraminal stenosis. C7-T1: Normal disc space and facet joints.  There is no spinal canal stenosis. No neural foraminal stenosis. IMPRESSION: 1. No acute abnormality of the cervical spine. 2. Progression of mild spinal canal stenosis at  C3-4 due to small left subarticular disc protrusion. 3. Solid arthrodesis at C4-C5, C5-6 and C6-7. Electronically Signed   By: Ulyses Jarred M.D.   On: 08/05/2019 03:38   MR THORACIC SPINE WO CONTRAST  Result Date: 08/05/2019 CLINICAL DATA:  Fall. Bilateral lower extremity weakness. EXAM: MRI THORACIC SPINE WITHOUT CONTRAST TECHNIQUE: Multiplanar, multisequence MR imaging of the thoracic spine was performed. No intravenous contrast was administered. COMPARISON:  Thoracic spine radiograph 08/04/2019 FINDINGS: Alignment:  Physiologic. Vertebrae: No fracture, evidence of discitis, or bone lesion. Cord: Small focus of left eccentric hyperintense T2-weighted signal within the spinal cord at the T5-6 level. Paraspinal and other soft tissues: Negative. Disc levels: T3-4: Small central disc protrusion. No stenosis. T5-6: Small disc bulge with facet hypertrophy. No stenosis. T10-11: Mild disc bulge. No spinal canal stenosis. IMPRESSION: 1. No acute abnormality of the thoracic spine. 2. 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 3. Mild thoracic degenerative disc disease without spinal canal or neural foraminal stenosis. Electronically Signed   By: Ulyses Jarred M.D.   On: 08/05/2019 03:45   MR LUMBAR SPINE WO CONTRAST  Result Date: 08/05/2019 CLINICAL DATA:  Bilateral lower extremity weakness and pain. Fall. EXAM: MRI LUMBAR SPINE WITHOUT CONTRAST TECHNIQUE: Multiplanar, multisequence MR imaging of the lumbar spine was performed. No intravenous contrast was administered. COMPARISON:  None. FINDINGS: Segmentation:  Standard Alignment:  Grade 1 anterolisthesis at L3-4 and L4-5. Vertebrae: Facet edema at L3-4. No acute fracture. No discitis-osteomyelitis. Conus  medullaris and cauda equina: Conus extends to the L1 level. Conus and cauda equina appear normal. Paraspinal and other soft tissues: Negative Disc levels: T12-L1: Minimal disc bulge. No stenosis. L1-L2: Minimal disc bulge. Normal facets. There is no spinal canal stenosis. No neural foraminal stenosis. L2-L3: Mild disc bulge with mild facet hypertrophy. Mild spinal canal stenosis. Mild right neural foraminal stenosis. L3-L4: Intermediate sized disc bulge with severe facet hypertrophy. Severe spinal canal stenosis. Severe right and mild left neural foraminal stenosis. L4-L5: Left asymmetric disc bulge and severe facet hypertrophy. Left lateral recess narrowing without central spinal canal stenosis. Mild left neural foraminal stenosis. L5-S1: Normal disc space and facet joints. There is no spinal canal stenosis. No neural foraminal stenosis. Visualized sacrum: Normal. IMPRESSION: 1. Severe spinal canal stenosis and right neural foraminal stenosis at L3-L4 due to combination of disc bulge and severe facet arthrosis. 2. Mild spinal canal stenosis at L2-L3. 3. Left lateral recess narrowing at L4-L5 could contribute to left L5 radiculopathy. Electronically Signed   By: Ulyses Jarred M.D.   On: 08/05/2019 03:52   DG Knee Complete 4 Views Right  Result Date: 08/05/2019 CLINICAL DATA:  Fall onto right knee with pain and swelling EXAM: RIGHT KNEE - COMPLETE 4+ VIEW COMPARISON:  04/05/2018 FINDINGS: Degenerative spurring especially at the medial compartment where there may be joint narrowing. No acute fracture, subluxation, or convincing joint effusion IMPRESSION: 1. No acute finding. 2. Tricompartmental osteoarthritis. Electronically Signed   By: Monte Fantasia M.D.   On: 08/05/2019 07:51    Review of Systems  Constitutional: Positive for activity change. Negative for appetite change, chills and fever.  HENT: Negative.   Eyes: Negative.   Respiratory: Negative for chest tightness, shortness of breath and wheezing.    Cardiovascular: Negative.   Gastrointestinal: Negative.   Endocrine: Negative.   Genitourinary: Negative for decreased urine volume, difficulty urinating, dysuria and flank pain.  Musculoskeletal: Positive for arthralgias, back  pain, gait problem, joint swelling and myalgias. Negative for neck pain and neck stiffness.  Skin: Negative.   Allergic/Immunologic: Negative.   Neurological: Positive for weakness. Negative for numbness.  Hematological: Negative.   Psychiatric/Behavioral: Negative.    Blood pressure (!) 94/36, pulse 79, temperature 98.1 F (36.7 C), temperature source Oral, resp. rate 16, weight 114.3 kg, SpO2 98 %. Physical Exam  Constitutional: She is oriented to person, place, and time. She appears well-developed and well-nourished.  HENT:  Head: Normocephalic and atraumatic.  Eyes: Pupils are equal, round, and reactive to light. Conjunctivae and EOM are normal.  Cardiovascular: Normal rate and regular rhythm.  Respiratory: Effort normal. No respiratory distress.  GI: Soft. She exhibits no distension. There is no abdominal tenderness.  Musculoskeletal:     Cervical back: Normal range of motion and neck supple.     Right knee: Bony tenderness present. Decreased range of motion.  Neurological: She is alert and oriented to person, place, and time. She has normal strength. No cranial nerve deficit or sensory deficit.  Reflex Scores:      Patellar reflexes are 1+ on the right side and 0 on the left side. Skin: Skin is warm and dry.  Psychiatric: She has a normal mood and affect. Her behavior is normal. Judgment and thought content normal.    Assessment/Plan: Patient with increasing pain and weakness over the last three months with a fall yesterday evening. Imaging demonstrated severe spinal canal stenosis at L3-4. She has mild spinal canal stenosis at C3-4. She is without obvious weakness or sensory loss on exam. Patient expressed that she does not wish to undergo surgery for  her lumbar spine at this time. Recommend working with physical and occupational therapies while hospitalized. She can follow up with Dr. Annette Stable as an outpatient in 3-4 weeks.  Pamela [name] 08/05/2019, 10:06 AM

## 2019-08-05 NOTE — Evaluation (Signed)
Physical Therapy Evaluation Patient Details Name: Pamela [name] MRN: [id number] DOB: 09/16/1955 Today's Date: 08/05/2019   History of Present Illness  Pt is a 64 y/o female admitted after fall onto L knee. Reports her BLE gave out. Imaging revealed severe spinal canal stenosis at L3-4. PMH includes RA, HTN, and L TKA.   Clinical Impression  Pt admitted secondary to problem above with deficits below. Pt requiring min guard to min A for steadying during gait without AD. Increased pain in RLE during mobility tasks. Educated about using RW for increased safety at d/c. Feel pt would benefit from outpatient PT given current deficits. Will continue to follow acutely to maximize functional mobility independence and safety.     Follow Up Recommendations Outpatient PT;Supervision for mobility/OOB    Equipment Recommendations  3in1 (PT)    Recommendations for Other Services       Precautions / Restrictions Precautions Precautions: Fall Precaution Comments: Had fall at home  Restrictions Weight Bearing Restrictions: No      Mobility  Bed Mobility Overal bed mobility: Needs Assistance Bed Mobility: Supine to Sit     Supine to sit: Supervision     General bed mobility comments: Supervision for safety.   Transfers Overall transfer level: Needs assistance Equipment used: None Transfers: Sit to/from Stand Sit to Stand: Min guard         General transfer comment: Min guard for safety to stand from higher stretcher.   Ambulation/Gait Ambulation/Gait assistance: Min assist;Min guard Gait Distance (Feet): 40 Feet Assistive device: None Gait Pattern/deviations: Step-through pattern;Decreased step length - right;Decreased step length - left;Decreased weight shift to right;Antalgic Gait velocity: Decreased   General Gait Details: Slow, antalgic gait secondary to R knee pain. Slight R knee instability noted. Educated about using RW at home to increase safety with mobility.   Stairs             Wheelchair Mobility    Modified Rankin (Stroke Patients Only)       Balance Overall balance assessment: Needs assistance Sitting-balance support: No upper extremity supported;Feet supported Sitting balance-Leahy Scale: Good     Standing balance support: No upper extremity supported;During functional activity Standing balance-Leahy Scale: Fair                               Pertinent Vitals/Pain Pain Assessment: 0-10 Pain Score: 8  Pain Location: R knee Pain Descriptors / Indicators: Grimacing;Guarding Pain Intervention(s): Monitored during session;Limited activity within patient's tolerance;Repositioned    Home Living Family/patient expects to be discharged to:: Private residence Living Arrangements: Spouse/significant other Available Help at Discharge: Family;Available 24 hours/day Type of Home: House Home Access: Stairs to enter Entrance Stairs-Rails: Left Entrance Stairs-Number of Steps: 3 Home Layout: One level Home Equipment: Walker - 2 wheels      Prior Function Level of Independence: Independent               Hand Dominance        Extremity/Trunk Assessment   Upper Extremity Assessment Upper Extremity Assessment: Overall WFL for tasks assessed    Lower Extremity Assessment Lower Extremity Assessment: RLE deficits/detail RLE Deficits / Details: Increased pain in R knee which limited ROM and weight bearing tolerance.        Communication   Communication: No difficulties  Cognition Arousal/Alertness: Awake/alert Behavior During Therapy: WFL for tasks assessed/performed Overall Cognitive Status: Within Functional Limits for tasks assessed  General Comments General comments (skin integrity, edema, etc.): Pt's husband present during session     Exercises     Assessment/Plan    PT Assessment Patient needs continued PT services  PT Problem List Decreased  strength;Decreased range of motion;Decreased balance;Decreased activity tolerance;Decreased mobility;Decreased knowledge of use of DME;Pain       PT Treatment Interventions DME instruction;Gait training;Stair training;Therapeutic exercise;Balance training;Therapeutic activities;Functional mobility training;Patient/family education    PT Goals (Current goals can be found in the Care Plan section)  Acute Rehab PT Goals Patient Stated Goal: to go home PT Goal Formulation: With patient Time For Goal Achievement: 08/19/19 Potential to Achieve Goals: Good    Frequency Min 3X/week   Barriers to discharge        Co-evaluation               AM-PAC PT "6 Clicks" Mobility  Outcome Measure Help needed turning from your back to your side while in a flat bed without using bedrails?: None Help needed moving from lying on your back to sitting on the side of a flat bed without using bedrails?: None Help needed moving to and from a bed to a chair (including a wheelchair)?: A Little Help needed standing up from a chair using your arms (e.g., wheelchair or bedside chair)?: A Little Help needed to walk in hospital room?: A Little Help needed climbing 3-5 steps with a railing? : A Lot 6 Click Score: 19    End of Session Equipment Utilized During Treatment: Gait belt Activity Tolerance: Patient limited by pain Patient left: in bed;with call bell/phone within reach;with family/visitor present(sitting EOB ) Nurse Communication: Mobility status PT Visit Diagnosis: Other abnormalities of gait and mobility (R26.89);Difficulty in walking, not elsewhere classified (R26.2);Pain Pain - Right/Left: Right Pain - part of body: Knee    Time: 1252-1305 PT Time Calculation (min) (ACUTE ONLY): 13 min   Charges:   PT Evaluation $PT Eval Low Complexity: 1 Low          Lou Miner, DPT  Acute Rehabilitation Services  Pager: [phone] Office: [phone]   Rudean Hitt 08/05/2019, 2:47 PM

## 2019-08-05 NOTE — Discharge Summary (Signed)
Physician Discharge Summary  EMERLYN [name] [id number] DOB: [birth year] DOA: 08/04/2019  PCP: Biagio Borg, MD  Admit date: 08/04/2019 Discharge date: 08/05/2019  Admitted From: Home Discharge disposition: Home with outpatient PT   Code Status: Full Code  Diet Recommendation: Cardiac diet   Recommendations for Outpatient Follow-Up:   1. Follow-up with outpatient PT 2. Follow-up with neurosurgery as an outpatient  Discharge Diagnosis:   Principal Problem:   Inability to walk Active Problems:   Essential hypertension   GERD without esophagitis   Rheumatoid arthritis involving multiple sites with positive rheumatoid factor (HCC)   Spinal stenosis of lumbar region    History of Present Illness / Brief narrative:  Pamela [name] is a 64 year old female with a history significant for depression, hypertension, hyperlipidemia, left total knee replacement, and C4-C7 ACDF.  She reports worsening low back pain and weakness of her lower extremities over the last three months. She fell yesterday evening, striking her right knee and was unable to ambulate after the fall and hence presented to the ED.   In the ED, MRI of her lumbar spine revealed severe stenosis.  She was kept in observation under hospitalist service.  Neurosurgery consult was called.  Hospital Course:  Difficulty walking -Patient exhibiting 48-month history of progressively worsening weakness of the bilateral lower extremities in the absence of back pain -MRI of spine as recommended by Dr. Leonel Ramsay of neurology did reveal severe spinal canal stenosis of L3-L4 with mild stenosis of C3-C4. -Concerning the severe lumbar stenosis -Evaluated by neurosurgery.  She is without obvious weakness or sensory loss on exam. Patient expressed that she does not wish to undergo surgery for her lumbar spine at this time. She can follow up with Dr. Annette Stable as an outpatient in 3-4 weeks. -PT eval obtained.  Outpatient PT  recommended  Essential hypertension -Continue home regimen of antihypertensive therapy  Rheumatoid arthritis  -involving multiple sites with positive rheumatoid factor -Continue methotrexate, Plaquenil  GERD without esophagitis -Continue home regimen of PPI  Stable for discharge to home today with outpatient PT  Subjective:  Seen and examined this afternoon in the ED.  [name].  Sitting up at the edge of the trolley in the holding area.  Feels better.  Able to walk with physical therapy.  Wants to go home  Discharge Exam:   Vitals:   08/05/19 0135 08/05/19 0451 08/05/19 1017 08/05/19 1624  BP: 132/67 (!) 94/36 (!) 110/59 (!) 147/68  Pulse: 76 79 82 97  Resp: 16 16 18 18   Temp: 97.7 F (36.5 C) 98.1 F (36.7 C) 98 F (36.7 C) 99.1 F (37.3 C)  TempSrc: Oral Oral Oral Oral  SpO2: 99% 98% 100% 98%  Weight:        Body mass index is 37.21 kg/m.  General exam: Appears calm and comfortable.  Skin: No rashes, lesions or ulcers. HEENT: Atraumatic, normocephalic, supple neck, no obvious bleeding Lungs: Clear to auscultation bilaterally CVS: Regular rate and rhythm, no murmur GI/Abd soft, nontender, nondistended, bowel sound present CNS: Alert, awake, oriented x3 Psychiatry: Mood appropriate Extremities: No pedal edema, no calf tenderness  Discharge Instructions:  Wound care: None Discharge Instructions    Increase activity slowly   Complete by: As directed      Follow-up Information    Biagio Borg, MD Follow up.   Specialties: Internal Medicine, Radiology Contact information: [place] [address] [phone]        Earnie Larsson,  MD Follow up in 4 week(s).   Specialty: Neurosurgery Contact information: [address] [place] [address] [phone]          Allergies as of 08/05/2019      Reactions   Cymbalta [duloxetine Hcl] Other (See Comments)   Feeling weird      Medication  List    TAKE these medications   acetaminophen 500 MG tablet Commonly known as: TYLENOL Take 1,000 mg by mouth daily as needed for mild pain.   albuterol 108 (90 Base) MCG/ACT inhaler Commonly known as: VENTOLIN HFA Inhale 2 puffs into the lungs every 6 (six) hours as needed for wheezing or shortness of breath.   ALPRAZolam 0.25 MG tablet Commonly known as: XANAX TAKE 1 TABLET BY MOUTH TWICE DAILY AS NEEDED What changed: reasons to take this   atorvastatin 20 MG tablet Commonly known as: LIPITOR Take 1 tablet (20 mg total) by mouth daily.   B-D TB SYRINGE 1CC/27GX1/2" 27G X 1/2" 1 ML Misc Generic drug: TUBERCULIN SYR 1CC/27GX1/2" USE ONCE WEEKLY   citalopram 10 MG tablet Commonly known as: CELEXA TAKE 1 TABLET(10 MG) BY MOUTH DAILY   folic acid 1 MG tablet Commonly known as: FOLVITE Take 2 tablets (2 mg total) by mouth daily.   gabapentin 300 MG capsule Commonly known as: NEURONTIN Take 1 capsule (300 mg total) by mouth 2 (two) times daily.   hydrochlorothiazide 12.5 MG capsule Commonly known as: MICROZIDE TAKE 1 CAPSULE(12.5 MG) BY MOUTH DAILY What changed: See the new instructions.   hydroxychloroquine 200 MG tablet Commonly known as: Plaquenil Take 1 tablet (200 mg total) by mouth 2 (two) times daily.   Insulin Syringe-Needle U-100 28G X 5/16" 1 ML Misc Use 1 syringe every 7 days to inject methotrexate.   losartan 100 MG tablet Commonly known as: COZAAR Take 1 tablet (100 mg total) by mouth daily.   methotrexate 50 MG/2ML injection Inject 0.5 mLs (12.5 mg total) into the skin once a week.   metoCLOPramide 10 MG tablet Commonly known as: REGLAN TAKE 1 TABLET BY MOUTH TWICE DAILY MORNING AND BEDTIME What changed: See the new instructions.   multivitamin with minerals Tabs tablet Take 1 tablet by mouth daily.   pantoprazole 40 MG tablet Commonly known as: PROTONIX TAKE 1 TABLET(40 MG) BY MOUTH DAILY What changed: See the new instructions.    predniSONE 10 MG tablet Commonly known as: DELTASONE 3 tabs by mouth per day for 3 days,2tabs per day for 3 days,1tab per day for 3 days   sertraline 100 MG tablet Commonly known as: ZOLOFT Take 1 tablet (100 mg total) by mouth daily.   traMADol 50 MG tablet Commonly known as: ULTRAM TAKE 1 TABLET BY MOUTH EVERY 6 HOURS AS NEEDED FOR PAIN What changed:   how much to take  how to take this  when to take this  reasons to take this  additional instructions   Vitamin D (Cholecalciferol) 50 MCG (2000 UT) Caps Take 1 capsule by mouth daily.   Vitamin D (Ergocalciferol) 1.25 MG (50000 UNIT) Caps capsule Commonly known as: DRISDOL Take 1 capsule (50,000 Units total) by mouth every 7 (seven) days.   Xalatan 0.005 % ophthalmic solution Generic drug: latanoprost Place 1 drop into the right eye at bedtime.   zolpidem 10 MG tablet Commonly known as: AMBIEN TAKE 1 TABLET BY MOUTH AT BEDTIME FOR SLEEP What changed: See the new instructions.  Durable Medical Equipment  (From admission, onward)         Start     Ordered   08/05/19 1639  DME 3-in-1  Once     08/05/19 1638          Time coordinating discharge: 35 minutes  The results of significant diagnostics from this hospitalization (including imaging, microbiology, ancillary and laboratory) are listed below for reference.    Procedures and Diagnostic Studies:   DG Thoracic Spine 2 View  Result Date: 08/04/2019 CLINICAL DATA:  Fall EXAM: THORACIC SPINE 2 VIEWS COMPARISON:  None. FINDINGS: Vertebral body heights are maintained. Degenerative osteophytes at the mid and lower thoracic spine. IMPRESSION: Degenerative changes.  No acute osseous abnormality Electronically Signed   By: Donavan Foil M.D.   On: 08/04/2019 23:23   DG Lumbar Spine Complete  Result Date: 08/04/2019 CLINICAL DATA:  Lower extremity weakness and pain after fall EXAM: LUMBAR SPINE - COMPLETE 4+ VIEW COMPARISON:  08/19/2011 FINDINGS: S  shaped scoliosis of the thoracolumbar spine. Trace anterolisthesis L4 on L5. Vertebral body heights are maintained. Mild degenerative changes at T12-L1, L1-L2, L2-L3, L3-L4 and L5-S1. Mild facet degenerative changes of the lower lumbar spine. IMPRESSION: Mild scoliosis and degenerative changes. No acute osseous abnormality. Electronically Signed   By: Donavan Foil M.D.   On: 08/04/2019 23:25   MR Cervical Spine Wo Contrast  Result Date: 08/05/2019 CLINICAL DATA:  Fall. Difficulty standing. EXAM: MRI CERVICAL SPINE WITHOUT CONTRAST TECHNIQUE: Multiplanar, multisequence MR imaging of the cervical spine was performed. No intravenous contrast was administered. COMPARISON:  Cervical spine MRI 05/21/2005 FINDINGS: Alignment: Physiologic. Vertebrae: No fracture, evidence of discitis, or bone lesion. C4-5 ACDF with posterior instrumented fusion at C4-6. Cord: Normal signal and morphology. Posterior Fossa, vertebral arteries, paraspinal tissues: Visualized posterior fossa is normal. Vertebral artery flow voids are preserved. No prevertebral soft tissue swelling. Disc levels: C1-2: Small central disc protrusion. C2-3: Normal disc space and facet joints. There is no spinal canal stenosis. No neural foraminal stenosis. C3-4: Small left subarticular disc protrusion. Progression of mild spinal canal stenosis. No neural foraminal stenosis. C4-5: ACDF with solid arthrodesis. There is no spinal canal stenosis. No neural foraminal stenosis. C5-6: ACDF with solid arthrodesis. There is no spinal canal stenosis. No neural foraminal stenosis. C6-7: Solid anterior fusion. There is no spinal canal stenosis. No neural foraminal stenosis. C7-T1: Normal disc space and facet joints. There is no spinal canal stenosis. No neural foraminal stenosis. IMPRESSION: 1. No acute abnormality of the cervical spine. 2. Progression of mild spinal canal stenosis at C3-4 due to small left subarticular disc protrusion. 3. Solid arthrodesis at C4-C5, C5-6  and C6-7. Electronically Signed   By: Ulyses Jarred M.D.   On: 08/05/2019 03:38   MR THORACIC SPINE WO CONTRAST  Result Date: 08/05/2019 CLINICAL DATA:  Fall. Bilateral lower extremity weakness. EXAM: MRI THORACIC SPINE WITHOUT CONTRAST TECHNIQUE: Multiplanar, multisequence MR imaging of the thoracic spine was performed. No intravenous contrast was administered. COMPARISON:  Thoracic spine radiograph 08/04/2019 FINDINGS: Alignment:  Physiologic. Vertebrae: No fracture, evidence of discitis, or bone lesion. Cord: Small focus of left eccentric hyperintense T2-weighted signal within the spinal cord at the T5-6 level. Paraspinal and other soft tissues: Negative. Disc levels: T3-4: Small central disc protrusion. No stenosis. T5-6: Small disc bulge with facet hypertrophy. No stenosis. T10-11: Mild disc bulge. No spinal canal stenosis. IMPRESSION: 1. No acute abnormality of the thoracic spine. 2. 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 3. Mild thoracic degenerative disc disease without spinal canal or neural foraminal stenosis. Electronically Signed   By: Ulyses Jarred M.D.   On: 08/05/2019 03:45   MR LUMBAR SPINE WO CONTRAST  Result Date: 08/05/2019 CLINICAL DATA:  Bilateral lower extremity weakness and pain. Fall. EXAM: MRI LUMBAR SPINE WITHOUT CONTRAST TECHNIQUE: Multiplanar, multisequence MR imaging of the lumbar spine was performed. No intravenous contrast was administered. COMPARISON:  None. FINDINGS: Segmentation:  Standard Alignment:  Grade 1 anterolisthesis at L3-4 and L4-5. Vertebrae: Facet edema at L3-4. No acute fracture. No discitis-osteomyelitis. Conus medullaris and cauda equina: Conus extends to the L1 level. Conus and cauda equina appear normal. Paraspinal and other soft tissues: Negative Disc levels: T12-L1: Minimal disc bulge. No stenosis. L1-L2: Minimal disc bulge. Normal facets. There is no spinal canal  stenosis. No neural foraminal stenosis. L2-L3: Mild disc bulge with mild facet hypertrophy. Mild spinal canal stenosis. Mild right neural foraminal stenosis. L3-L4: Intermediate sized disc bulge with severe facet hypertrophy. Severe spinal canal stenosis. Severe right and mild left neural foraminal stenosis. L4-L5: Left asymmetric disc bulge and severe facet hypertrophy. Left lateral recess narrowing without central spinal canal stenosis. Mild left neural foraminal stenosis. L5-S1: Normal disc space and facet joints. There is no spinal canal stenosis. No neural foraminal stenosis. Visualized sacrum: Normal. IMPRESSION: 1. Severe spinal canal stenosis and right neural foraminal stenosis at L3-L4 due to combination of disc bulge and severe facet arthrosis. 2. Mild spinal canal stenosis at L2-L3. 3. Left lateral recess narrowing at L4-L5 could contribute to left L5 radiculopathy. Electronically Signed   By: Ulyses Jarred M.D.   On: 08/05/2019 03:52   DG Knee Complete 4 Views Right  Result Date: 08/05/2019 CLINICAL DATA:  Fall onto right knee with pain and swelling EXAM: RIGHT KNEE - COMPLETE 4+ VIEW COMPARISON:  04/05/2018 FINDINGS: Degenerative spurring especially at the medial compartment where there may be joint narrowing. No acute fracture, subluxation, or convincing joint effusion IMPRESSION: 1. No acute finding. 2. Tricompartmental osteoarthritis. Electronically Signed   By: Monte Fantasia M.D.   On: 08/05/2019 07:51     Labs:   Basic Metabolic Panel: Recent Labs  Lab 08/05/19 0010  NA 138  K 3.5  CL 101  CO2 25  GLUCOSE 103*  BUN 11  CREATININE 0.95  CALCIUM 9.6   GFR Estimated Creatinine Clearance: 80.7 mL/min (by C-G formula based on SCr of 0.95 mg/dL). Liver Function Tests: No results for input(s): AST, ALT, ALKPHOS, BILITOT, PROT, ALBUMIN in the last 168 hours. No results for input(s): LIPASE, AMYLASE in the last 168 hours. No results for input(s): AMMONIA in the last 168  hours. Coagulation profile No results for input(s): INR, PROTIME in the last 168 hours.  CBC: Recent Labs  Lab 08/05/19 0010  WBC 9.6  NEUTROABS 7.4  HGB 11.7*  HCT 35.6*  MCV 83.8  PLT 241   Cardiac Enzymes: No results for input(s): CKTOTAL, CKMB, CKMBINDEX, TROPONINI in the last 168 hours. BNP: Invalid input(s): POCBNP CBG: No results for input(s): GLUCAP in the last 168 hours. D-Dimer No results for input(s): DDIMER in the last 72 hours. Hgb A1c Recent Labs    08/05/19 0010  HGBA1C 6.0*   Lipid Profile No results for input(s): CHOL, HDL, LDLCALC, TRIG, CHOLHDL, LDLDIRECT in the last 72 hours. Thyroid function studies No results for input(s): TSH, T4TOTAL, T3FREE, THYROIDAB in the last 72 hours.  Invalid input(s): FREET3 Anemia  work up [employer]    08/05/19 0920  VITAMINB12 469  FOLATE 20.3   Microbiology Recent Results (from the past 240 hour(s))  SARS Coronavirus 2 by RT PCR (hospital order, performed in [facility] hospital lab) Nasopharyngeal Nasopharyngeal Swab     Status: None   Collection Time: 08/05/19  7:24 AM   Specimen: Nasopharyngeal Swab  Result Value Ref Range Status   SARS Coronavirus 2 NEGATIVE NEGATIVE Final    Comment: (NOTE) SARS-CoV-2 target nucleic acids are NOT DETECTED. The SARS-CoV-2 RNA is generally detectable in upper and lower respiratory specimens during the acute phase of infection. The lowest concentration of SARS-CoV-2 viral copies this assay can detect is 250 copies / mL. A negative result does not preclude SARS-CoV-2 infection and should not be used as the sole basis for treatment or other patient management decisions.  A negative result may occur with improper specimen collection / handling, submission of specimen other than nasopharyngeal swab, presence of viral mutation(s) within the areas targeted by this assay, and inadequate number of viral copies (<250 copies / mL). A negative result must be combined with  clinical observations, patient history, and epidemiological information. Fact Sheet for Patients:   StrictlyIdeas.no Fact Sheet for Healthcare Providers: BankingDealers.co.za This test is not yet approved or cleared  by the [place] FDA and has been authorized for detection and/or diagnosis of SARS-CoV-2 by FDA under an Emergency Use Authorization (EUA).  This EUA will remain in effect (meaning this test can be used) for the duration of the COVID-19 declaration under Section 564(b)(1) of the Act, 21 U.S.C. section 360bbb-3(b)(1), unless the authorization is terminated or revoked sooner. Performed at [facility], [place] [address], [place], [place] [address]     Please note: You were cared for by a hospitalist during your hospital stay. Once you are discharged, your primary care physician will handle any further medical issues. Please note that NO REFILLS for any discharge medications will be authorized once you are discharged, as it is imperative that you return to your primary care physician (or establish a relationship with a primary care physician if you do not have one) for your post hospital discharge needs so that they can reassess your need for medications and monitor your lab values.  Signed: Terrilee Croak  [facility] 08/05/2019, 4:38 PM

## 2019-08-05 NOTE — H&P (Addendum)
History and Physical    Pamela [name] [id number] DOB: 06/28/1955 DOA: 08/04/2019  PCP: Biagio Borg, MD  Patient coming from: home   Chief Complaint:  Chief Complaint  Patient presents with  . Leg Pain  . Fall     HPI:    64 year old female with past medical history of rheumatoid arthritis, hyperlipidemia, gastroesophageal reflux disease and hypertension who presents to [facility] emergency department with complaints of difficulty with ambulation.  Patient explains that for the past 3 months she has been experiencing bilateral lower extremity weakness.  This bilateral lower extremity weakness was initially mild in severity but slowly worsened over the past 3 months.  Patient denies any associated back pain, dysuria, fecal or bladder incontinence.  Patient symptoms continue to worsen.  Yesterday evening while the patient was walking to the kitchen she suddenly collapsed, landing on her right knee.  Patient now complains of severe right knee pain, throbbing in quality, nonradiating, worse with movement of the affected knee.  Since the patient's fall, the patient has been unable to ambulate prompting her to present to [facility] emergency department for evaluation.  Upon evaluation in the emergency department, patient's case was discussed with Dr. Leonel Ramsay with neurology who recommended MRI of the spine.  This MRI of the spine revealed severe spinal canal stenosis of L3-L4 with severe spinal stenosis of C3-C4.  Case was then discussed with Arnetha Massy neurosurgery APP who stated that they will come to see the patient this morning for consultation.  The hospitalist group was then called to assess the patient for admission the hospital.  Review of Systems: A 10-system review of systems has been performed and all systems are negative with the exception of what is listed in the HPI.    Past Medical History:  Diagnosis Date  . ALLERGIC RHINITIS 12/18/2006   Qualifier: Diagnosis of  By: [place], [place]    . Anxiety   . Arthritis   . DEPRESSION, SITUATIONAL 03/15/2007   Qualifier: Diagnosis of  By: Wynona Luna   . GERD (gastroesophageal reflux disease)   . Headache    occasional  . History of kidney stones   . HYPERLIPIDEMIA 12/18/2006   Qualifier: History of  By: Danny Lawless CMA, [place]    . Hypertension   . HYPERTENSION 12/18/2006   Qualifier: Diagnosis of  By: Danny Lawless CMA, [place]    . INSOMNIA-SLEEP DISORDER-UNSPEC 08/25/2009   Qualifier: Diagnosis of  By: Jenny Reichmann MD, [place] Oris   . Pain in knee region after total knee replacement ([facility]) 04/03/2012  . Pneumonia   . RENAL CALCULUS, HX OF 12/18/2006   Qualifier: Diagnosis of  By: [place], [place]      Past Surgical History:  Procedure Laterality Date  . CERVICAL DISC SURGERY     x 3  . CONVERSION TO TOTAL KNEE Left 10/09/2016   Procedure: Conversion left uni compartment arthroplasty to total knee arthroplasty;  Surgeon: Paralee Cancel, MD;  Location: [facility] ORS;  Service: Orthopedics;  Laterality: Left;  90 mins  . KNEE ARTHROSCOPY     partial  . total left knee arthoplasty     10/09/16 Dr. Alvan Dame     reports that she quit smoking about 51 years ago. Her smoking use included cigarettes. She has a 0.75 pack-year smoking history. She has never used smokeless tobacco. She reports current alcohol use. She reports that she does not use drugs.  Allergies  Allergen Reactions  . Cymbalta [Duloxetine Hcl] Other (  See Comments)    Feeling weird    Family History  Problem Relation Age of Onset  . Stroke Mother   . Heart disease Father   . Lung cancer Sister   . Lung cancer Brother   . Healthy Daughter   . Healthy Daughter   . Healthy Daughter      Prior to Admission medications   Medication Sig Start Date End Date Taking? Authorizing Provider  acetaminophen (TYLENOL) 500 MG tablet Take 1,000 mg by mouth daily as needed for mild pain.   Yes [provider]  ALPRAZolam (XANAX) 0.25  MG tablet TAKE 1 TABLET BY MOUTH TWICE DAILY AS NEEDED Patient taking differently: Take 0.25 mg by mouth 2 (two) times daily as needed for anxiety or sleep.  06/16/19  Yes Biagio Borg, MD  atorvastatin (LIPITOR) 20 MG tablet Take 1 tablet (20 mg total) by mouth daily. 01/14/19 08/05/19 Yes Biagio Borg, MD  folic acid (FOLVITE) 1 MG tablet Take 2 tablets (2 mg total) by mouth daily. 08/04/19  Yes Deveshwar, Abel Presto, MD  gabapentin (NEURONTIN) 300 MG capsule Take 1 capsule (300 mg total) by mouth 2 (two) times daily. 07/22/19  Yes Biagio Borg, MD  hydrochlorothiazide (MICROZIDE) 12.5 MG capsule TAKE 1 CAPSULE(12.5 MG) BY MOUTH DAILY Patient taking differently: Take 12.5 mg by mouth daily.  07/31/19  Yes Biagio Borg, MD  hydroxychloroquine (PLAQUENIL) 200 MG tablet Take 1 tablet (200 mg total) by mouth 2 (two) times daily. 07/31/19  Yes Deveshwar, Abel Presto, MD  losartan (COZAAR) 100 MG tablet Take 1 tablet (100 mg total) by mouth daily. 07/08/19  Yes Biagio Borg, MD  methotrexate 50 MG/2ML injection Inject 0.5 mLs (12.5 mg total) into the skin once a week. 07/23/19  Yes Deveshwar, Abel Presto, MD  metoCLOPramide (REGLAN) 10 MG tablet TAKE 1 TABLET BY MOUTH TWICE DAILY MORNING AND BEDTIME Patient taking differently: Take 10 mg by mouth in the morning and at bedtime.  07/01/19  Yes Milus Banister, MD  Multiple Vitamin (MULTIVITAMIN WITH MINERALS) TABS tablet Take 1 tablet by mouth daily.   Yes [provider]  pantoprazole (PROTONIX) 40 MG tablet TAKE 1 TABLET(40 MG) BY MOUTH DAILY Patient taking differently: Take 40 mg by mouth daily. TAKE 1 TABLET(40 MG) BY MOUTH DAILY 07/31/19  Yes Nandigam, Venia Minks, MD  sertraline (ZOLOFT) 100 MG tablet Take 1 tablet (100 mg total) by mouth daily. 07/08/19  Yes Biagio Borg, MD  traMADol (ULTRAM) 50 MG tablet TAKE 1 TABLET BY MOUTH EVERY 6 HOURS AS NEEDED FOR PAIN Patient taking differently: Take 50 mg by mouth every 6 (six) hours as needed for moderate pain.   07/22/19  Yes Biagio Borg, MD  Vitamin D, Cholecalciferol, 50 MCG (2000 UT) CAPS Take 1 capsule by mouth daily. 04/25/19  Yes Biagio Borg, MD  Vitamin D, Ergocalciferol, (DRISDOL) 1.25 MG (50000 UNIT) CAPS capsule Take 1 capsule (50,000 Units total) by mouth every 7 (seven) days. 07/11/19  Yes Biagio Borg, MD  XALATAN 0.005 % ophthalmic solution Place 1 drop into the right eye at bedtime.  05/15/19  Yes [provider]  zolpidem (AMBIEN) 10 MG tablet TAKE 1 TABLET BY MOUTH AT BEDTIME FOR SLEEP Patient taking differently: Take 10 mg by mouth at bedtime as needed for sleep.  03/28/19  Yes Biagio Borg, MD  albuterol (VENTOLIN HFA) 108 (90 Base) MCG/ACT inhaler Inhale 2 puffs into the lungs every 6 (six) hours as needed  for wheezing or shortness of breath. Patient not taking: Reported on 08/05/2019 07/08/19   Biagio Borg, MD  B-D TB SYRINGE 1CC/27GX1/2" 27G X 1/2" 1 ML MISC USE ONCE WEEKLY 06/18/19   [provider]  citalopram (CELEXA) 10 MG tablet TAKE 1 TABLET(10 MG) BY MOUTH DAILY Patient not taking: Reported on 08/05/2019 07/31/19   Biagio Borg, MD  Insulin Syringe-Needle U-100 28G X 5/16" 1 ML MISC Use 1 syringe every 7 days to inject methotrexate. 04/28/19   Bo Merino, MD  predniSONE (DELTASONE) 10 MG tablet 3 tabs by mouth per day for 3 days,2tabs per day for 3 days,1tab per day for 3 days Patient not taking: Reported on 07/23/2019 07/08/19   Biagio Borg, MD    Physical Exam: Vitals:   08/04/19 1944 08/04/19 1948 08/05/19 0135 08/05/19 0451  BP:  119/70 132/67 (!) 94/36  Pulse:  96 76 79  Resp:  16 16 16   Temp:  99.3 F (37.4 C) 97.7 F (36.5 C) 98.1 F (36.7 C)  TempSrc:  Oral Oral Oral  SpO2:  95% 99% 98%  Weight: 114.3 kg       Constitutional: Acute alert and oriented x3, no associated distress.   Skin: no rashes, no lesions, good skin turgor noted. Eyes: Pupils are equally reactive to light.  No evidence of scleral icterus or conjunctival pallor.    ENMT: Moist mucous membranes noted.  Posterior pharynx clear of any exudate or lesions.   Neck: normal, supple, no masses, no thyromegaly.  No evidence of jugular venous distension.   Respiratory: clear to auscultation bilaterally, no wheezing, no crackles. Normal respiratory effort. No accessory muscle use.  Cardiovascular: Regular rate and rhythm, no murmurs / rubs / gallops. No extremity edema. 2+ pedal pulses. No carotid bruits.  Chest:   Nontender without crepitus or deformity.   Back:   Nontender without crepitus or deformity. Abdomen: Abdomen is soft and nontender.  No evidence of intra-abdominal masses.  Positive bowel sounds noted in all quadrants.   Musculoskeletal: Pain with both passive and active range of motion of the right knee.  Crepitus noted with ROM B/L knees.  No joint deformity upper and lower extremities. no contractures. Normal muscle tone.  Neurologic: Patient is exhibiting 4 out of 5 strength of the bilateral proximal and distal muscle groups.  Sensation is grossly intact.  CN 2-12 grossly intact.  Patient is able to follow all commands.  Patient is responsive to both verbal and painful stimuli.   Psychiatric: Patient presents as a normal mood with appropriate affect.  Patient seems to possess insight as to their current situation.     Labs on Admission: I have personally reviewed following labs and imaging studies -   CBC: Recent Labs  Lab 08/05/19 0010  WBC 9.6  NEUTROABS 7.4  HGB 11.7*  HCT 35.6*  MCV 83.8  PLT [id number]   Basic Metabolic Panel: Recent Labs  Lab 08/05/19 0010  NA 138  K 3.5  CL 101  CO2 25  GLUCOSE 103*  BUN 11  CREATININE 0.95  CALCIUM 9.6   GFR: Estimated Creatinine Clearance: 80.7 mL/min (by C-G formula based on SCr of 0.95 mg/dL). Liver Function Tests: No results for input(s): AST, ALT, ALKPHOS, BILITOT, PROT, ALBUMIN in the last 168 hours. No results for input(s): LIPASE, AMYLASE in the last 168 hours. No results for input(s):  AMMONIA in the last 168 hours. Coagulation Profile: No results for input(s): INR, PROTIME in the last 168  hours. Cardiac Enzymes: No results for input(s): CKTOTAL, CKMB, CKMBINDEX, TROPONINI in the last 168 hours. BNP (last 3 results) No results for input(s): PROBNP in the last 8760 hours. HbA1C: No results for input(s): HGBA1C in the last 72 hours. CBG: No results for input(s): GLUCAP in the last 168 hours. Lipid Profile: No results for input(s): CHOL, HDL, LDLCALC, TRIG, CHOLHDL, LDLDIRECT in the last 72 hours. Thyroid Function Tests: No results for input(s): TSH, T4TOTAL, FREET4, T3FREE, THYROIDAB in the last 72 hours. Anemia Panel: No results for input(s): VITAMINB12, FOLATE, FERRITIN, TIBC, IRON, RETICCTPCT in the last 72 hours. Urine analysis:    Component Value Date/Time   COLORURINE YELLOW 08/04/2019 2226   APPEARANCEUR HAZY (A) 08/04/2019 2226   LABSPEC 1.012 08/04/2019 2226   PHURINE 5.0 08/04/2019 2226   GLUCOSEU NEGATIVE 08/04/2019 2226   GLUCOSEU NEGATIVE 07/08/2019 1234   HGBUR NEGATIVE 08/04/2019 2226   BILIRUBINUR NEGATIVE 08/04/2019 2226   BILIRUBINUR negative 04/16/2015 1639   KETONESUR NEGATIVE 08/04/2019 2226   PROTEINUR NEGATIVE 08/04/2019 2226   UROBILINOGEN 0.2 07/08/2019 1234   NITRITE NEGATIVE 08/04/2019 2226   LEUKOCYTESUR MODERATE (A) 08/04/2019 2226    Radiological Exams on Admission - Personally Reviewed: DG Thoracic Spine 2 View  Result Date: 08/04/2019 CLINICAL DATA:  Fall EXAM: THORACIC SPINE 2 VIEWS COMPARISON:  None. FINDINGS: Vertebral body heights are maintained. Degenerative osteophytes at the mid and lower thoracic spine. IMPRESSION: Degenerative changes.  No acute osseous abnormality Electronically Signed   By: Donavan Foil M.D.   On: 08/04/2019 23:23   DG Lumbar Spine Complete  Result Date: 08/04/2019 CLINICAL DATA:  Lower extremity weakness and pain after fall EXAM: LUMBAR SPINE - COMPLETE 4+ VIEW COMPARISON:  08/19/2011 FINDINGS: S  shaped scoliosis of the thoracolumbar spine. Trace anterolisthesis L4 on L5. Vertebral body heights are maintained. Mild degenerative changes at T12-L1, L1-L2, L2-L3, L3-L4 and L5-S1. Mild facet degenerative changes of the lower lumbar spine. IMPRESSION: Mild scoliosis and degenerative changes. No acute osseous abnormality. Electronically Signed   By: Donavan Foil M.D.   On: 08/04/2019 23:25   MR Cervical Spine Wo Contrast  Result Date: 08/05/2019 CLINICAL DATA:  Fall. Difficulty standing. EXAM: MRI CERVICAL SPINE WITHOUT CONTRAST TECHNIQUE: Multiplanar, multisequence MR imaging of the cervical spine was performed. No intravenous contrast was administered. COMPARISON:  Cervical spine MRI 05/21/2005 FINDINGS: Alignment: Physiologic. Vertebrae: No fracture, evidence of discitis, or bone lesion. C4-5 ACDF with posterior instrumented fusion at C4-6. Cord: Normal signal and morphology. Posterior Fossa, vertebral arteries, paraspinal tissues: Visualized posterior fossa is normal. Vertebral artery flow voids are preserved. No prevertebral soft tissue swelling. Disc levels: C1-2: Small central disc protrusion. C2-3: Normal disc space and facet joints. There is no spinal canal stenosis. No neural foraminal stenosis. C3-4: Small left subarticular disc protrusion. Progression of mild spinal canal stenosis. No neural foraminal stenosis. C4-5: ACDF with solid arthrodesis. There is no spinal canal stenosis. No neural foraminal stenosis. C5-6: ACDF with solid arthrodesis. There is no spinal canal stenosis. No neural foraminal stenosis. C6-7: Solid anterior fusion. There is no spinal canal stenosis. No neural foraminal stenosis. C7-T1: Normal disc space and facet joints. There is no spinal canal stenosis. No neural foraminal stenosis. IMPRESSION: 1. No acute abnormality of the cervical spine. 2. Progression of mild spinal canal stenosis at C3-4 due to small left subarticular disc protrusion. 3. Solid arthrodesis at C4-C5, C5-6  and C6-7. Electronically Signed   By: Ulyses Jarred M.D.   On: 08/05/2019 03:38  MR THORACIC SPINE WO CONTRAST  Result Date: 08/05/2019 CLINICAL DATA:  Fall. Bilateral lower extremity weakness. EXAM: MRI THORACIC SPINE WITHOUT CONTRAST TECHNIQUE: Multiplanar, multisequence MR imaging of the thoracic spine was performed. No intravenous contrast was administered. COMPARISON:  Thoracic spine radiograph 08/04/2019 FINDINGS: Alignment:  Physiologic. Vertebrae: No fracture, evidence of discitis, or bone lesion. Cord: Small focus of left eccentric hyperintense T2-weighted signal within the spinal cord at the T5-6 level. Paraspinal and other soft tissues: Negative. Disc levels: T3-4: Small central disc protrusion. No stenosis. T5-6: Small disc bulge with facet hypertrophy. No stenosis. T10-11: Mild disc bulge. No spinal canal stenosis. IMPRESSION: 1. No acute abnormality of the thoracic spine. 2. 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 3. Mild thoracic degenerative disc disease without spinal canal or neural foraminal stenosis. Electronically Signed   By: Ulyses Jarred M.D.   On: 08/05/2019 03:45   MR LUMBAR SPINE WO CONTRAST  Result Date: 08/05/2019 CLINICAL DATA:  Bilateral lower extremity weakness and pain. Fall. EXAM: MRI LUMBAR SPINE WITHOUT CONTRAST TECHNIQUE: Multiplanar, multisequence MR imaging of the lumbar spine was performed. No intravenous contrast was administered. COMPARISON:  None. FINDINGS: Segmentation:  Standard Alignment:  Grade 1 anterolisthesis at L3-4 and L4-5. Vertebrae: Facet edema at L3-4. No acute fracture. No discitis-osteomyelitis. Conus medullaris and cauda equina: Conus extends to the L1 level. Conus and cauda equina appear normal. Paraspinal and other soft tissues: Negative Disc levels: T12-L1: Minimal disc bulge. No stenosis. L1-L2: Minimal disc bulge. Normal facets. There is no spinal canal  stenosis. No neural foraminal stenosis. L2-L3: Mild disc bulge with mild facet hypertrophy. Mild spinal canal stenosis. Mild right neural foraminal stenosis. L3-L4: Intermediate sized disc bulge with severe facet hypertrophy. Severe spinal canal stenosis. Severe right and mild left neural foraminal stenosis. L4-L5: Left asymmetric disc bulge and severe facet hypertrophy. Left lateral recess narrowing without central spinal canal stenosis. Mild left neural foraminal stenosis. L5-S1: Normal disc space and facet joints. There is no spinal canal stenosis. No neural foraminal stenosis. Visualized sacrum: Normal. IMPRESSION: 1. Severe spinal canal stenosis and right neural foraminal stenosis at L3-L4 due to combination of disc bulge and severe facet arthrosis. 2. Mild spinal canal stenosis at L2-L3. 3. Left lateral recess narrowing at L4-L5 could contribute to left L5 radiculopathy. Electronically Signed   By: Ulyses Jarred M.D.   On: 08/05/2019 03:52     Assessment/Plan Principal Problem:   Inability to walk   Patient exhibiting 44-month history of progressively worsening weakness of the bilateral lower extremities in the absence of back pain  MRI of spine as recommended by Dr. Leonel Ramsay of neurology did reveal severe spinal canal stenosis of L3-L4 with mild stenosis of C3-C4.  Concerning the severe lumbar stenosis, the ER provider discussed the case with Arnetha Massy the neurosurgery APP and neurosurgery will evaluate the patient this morning for consultation.  Appreciate their input on significance of myelomalacia on thoracic MRI as well.  Physical therapy evaluation ordered  Right knee x-ray due to complaints of pain after falling.    Urinalysis not suggestive of infection  TSH performed 4/27 unremarkable.  Vitamin B12, folate pending.  Based on recommendations will consider discharging patient home with home health physical therapy versus outpatient physical therapy versus skilled nursing  facility.  Active Problems:   Spinal stenosis of lumbar region   See assessment and plan note above    Essential hypertension  Continue home regimen of antihypertensive therapy    right knee pain   Status post fall  Notable crepitus on examination although this is likely secondary to chronic osteoarthritis  Obtaining right knee x-ray, pending.    Rheumatoid arthritis involving multiple sites with positive rheumatoid factor (HCC)   Continue methotrexate, Plaquenil    GERD without esophagitis   Continue home regimen of PPI  Code Status:  Full code Family Communication: Deferred  Status is: Observation  The patient remains OBS appropriate and will d/c before 2 midnights.  Dispo: The patient is from: Home              Anticipated d/c is to: Home              Anticipated d/c date is: 2 days              Patient currently is not medically stable to d/c.        Vernelle Emerald MD [facility] Pager [phone]  If 7PM-7AM, please contact night-coverage [email or web address] Use universal [place] password for that web site. If you do not have the password, please call the hospital operator.  08/05/2019, 6:58 AM

## 2019-08-07 ENCOUNTER — Other Ambulatory Visit: Payer: Self-pay

## 2019-08-07 ENCOUNTER — Telehealth: Payer: Self-pay | Admitting: *Deleted

## 2019-08-07 MED ORDER — HYDROCHLOROTHIAZIDE 12.5 MG PO CAPS: ORAL_CAPSULE | ORAL | 3 refills | Status: DC

## 2019-08-07 NOTE — Telephone Encounter (Signed)
Pt was on TCM report admitted 08/05/19 for falls and leg weakness at home. Pt had MRI which showed Severe stenosis seen at L4-5. Recommends neurosurg consult and PT/OT. Pt D/C 08/05/19, and will follow-up w/Dr. Dora Sims 6/25../lmb.,./lmb

## 2019-08-07 NOTE — Telephone Encounter (Signed)
Medication was sent to [facility] instead of [facility]   1.Medication Requested:hydrochlorothiazide (MICROZIDE) 12.5 MG capsule  2. Pharmacy (Name, Street, City):[id number] - [place], [place] - [address]  3. On Med List: [facility]ES   4. Last Visit with PCP:  4.27.21   5. Next visit date with PCP: 10.28.21    Agent: Please be advised that RX refills may take up to 3 business days. We ask that you follow-up with your pharmacy.

## 2019-08-18 ENCOUNTER — Telehealth: Payer: Self-pay | Admitting: Gastroenterology

## 2019-08-18 MED ORDER — METOCLOPRAMIDE HCL 10 MG PO TABS: ORAL_TABLET | ORAL | 0 refills | Status: DC

## 2019-08-18 NOTE — Telephone Encounter (Signed)
Left message for patient to return call to office to schedule follow up as she is overdue. Rx sent to pharmacy with no refills.

## 2019-08-18 NOTE — Telephone Encounter (Signed)
Patient requesting refill on Reglan 

## 2019-08-20 NOTE — Progress Notes (Signed)
Office Visit Note  Patient: Pamela [name]             Date of Birth: 12/03/1955           MRN: [id number]             PCP: Biagio Borg, MD Referring: Biagio Borg, MD Visit Date: 09/03/2019 Occupation: @GUAROCC @  Subjective:  Joint stiffness.   History of Present Illness: Pamela [name] is a 64 y.o. female  with history of seropositive rheumatoid arthritis and osteoarthritis.  She is taking injectable methotrexate 0.5 mls (12.5 mg) every 7 days, folic acid 2 mg daily, Plaquenil 200 mg twice daily.  Plauqneil was added at last visit and she noticed improvement in her symptoms since starting in May 2021.  She denies any side effects, recent infections, or issues obtaining medication from the pharmacy.  Denies any recent rheumatoid arthritis flares. Reports joint stiffness in the morning that lasts about 30 mins.  She denies any joint pain/swelling/redness.   Activities of Daily Living:  Patient reports morning stiffness for 30  minutes.   Patient Denies nocturnal pain.  Difficulty dressing/grooming: Denies Difficulty climbing stairs: Reports Difficulty getting out of chair: Denies Difficulty using hands for taps, buttons, cutlery, and/or writing: Denies  Review of Systems  Constitutional: Positive for fatigue. Negative for night sweats, weight gain and weight loss.  HENT: Negative for mouth sores, trouble swallowing, trouble swallowing, mouth dryness and nose dryness.   Eyes: Negative for pain, redness, itching, visual disturbance and dryness.  Respiratory: Negative for cough, shortness of breath and difficulty breathing.   Cardiovascular: Negative for chest pain, palpitations, hypertension, irregular heartbeat and swelling in legs/feet.  Gastrointestinal: Negative for blood in stool, constipation and diarrhea.  Endocrine: Negative for increased urination.  Genitourinary: Negative for difficulty urinating, painful urination and vaginal dryness.  Musculoskeletal: Positive for  morning stiffness. Negative for arthralgias, joint pain, joint swelling, myalgias, muscle weakness, muscle tenderness and myalgias.  Skin: Negative for color change, rash, hair loss, redness, skin tightness, ulcers and sensitivity to sunlight.  Allergic/Immunologic: Negative for susceptible to infections.  Neurological: Negative for dizziness, numbness, headaches, memory loss, night sweats and weakness.  Hematological: Negative for bruising/bleeding tendency and swollen glands.  Psychiatric/Behavioral: Negative for depressed mood, confusion and sleep disturbance. The patient is not nervous/anxious.     PMFS History:  Patient Active Problem List   Diagnosis Date Noted  . Spinal stenosis of lumbar region 08/05/2019  . Inability to walk 08/05/2019  . Hives 07/08/2019  . History of glaucoma 04/24/2019  . Bursitis of left hip 12/25/2018  . MGUS (monoclonal gammopathy of unknown significance) 05/11/2018  . Rheumatoid arthritis involving multiple sites with positive rheumatoid factor ([place]) 05/03/2018  . High risk medication use 05/03/2018  . Status post total left knee replacement 05/03/2018  . DDD (degenerative disc disease), lumbar 05/03/2018  . Primary osteoarthritis of right knee 04/05/2018  . 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 Left rotator cuff tear 12/19/2017  . Left wrist pain 12/12/2017  . Left shoulder pain 08/22/2017  . Acute pain of right knee 04/17/2017  . Pain of left calf 04/13/2017  . 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 Hyperglycemia 09/23/2015  . Anxiety state 09/23/2015  . Abnormal urine odor 08/25/2015  . GERD without esophagitis 08/25/2015  . Upper abdominal pain 04/16/2015  . Early satiety 04/16/2015  . Dysuria 04/16/2015  . Encounter for well adult exam with abnormal findings 06/18/2014  . Greater trochanteric bursitis of  right hip 01/06/2013  . Left lumbar radiculitis 04/03/2012  . Eczema 04/03/2012  . 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 BURSITIS, RIGHT HIP 08/25/2009    . INSOMNIA-SLEEP DISORDER-UNSPEC 08/25/2009  . FATIGUE 08/25/2009  . Cervicalgia 08/14/2007  . Depression 03/15/2007  . HLD (hyperlipidemia) 12/18/2006  . Essential hypertension 12/18/2006  . Allergic rhinitis 12/18/2006  . RENAL CALCULUS, HX OF 12/18/2006    Past Medical History:  Diagnosis Date  . ALLERGIC RHINITIS 12/18/2006   Qualifier: Diagnosis of  By: [place], [place]    . Anxiety   . Arthritis   . DEPRESSION, SITUATIONAL 03/15/2007   Qualifier: Diagnosis of  By: Wynona Luna   . GERD (gastroesophageal reflux disease)   . Headache    occasional  . History of kidney stones   . HYPERLIPIDEMIA 12/18/2006   Qualifier: History of  By: Danny Lawless CMA, [place]    . Hypertension   . HYPERTENSION 12/18/2006   Qualifier: Diagnosis of  By: Danny Lawless CMA, [place]    . INSOMNIA-SLEEP DISORDER-UNSPEC 08/25/2009   Qualifier: Diagnosis of  By: Jenny Reichmann MD, [place]   . Pain in knee region after total knee replacement ([place]) 04/03/2012  . Pneumonia   . RENAL CALCULUS, HX OF 12/18/2006   Qualifier: Diagnosis of  By: Danny Lawless CMA, [place]      Family History  Problem Relation Age of Onset  . Stroke Mother   . Heart disease Father   . Lung cancer Sister   . Lung cancer Brother   . Healthy Daughter   . Healthy Daughter   . Healthy Daughter    Past Surgical History:  Procedure Laterality Date  . CERVICAL DISC SURGERY     x 3  . CONVERSION TO TOTAL KNEE Left 10/09/2016   Procedure: Conversion left uni compartment arthroplasty to total knee arthroplasty;  Surgeon: Paralee Cancel, MD;  Location: [facility];  Service: Orthopedics;  Laterality: Left;  90 mins  . KNEE ARTHROSCOPY     partial  . total left knee arthoplasty     10/09/16 Dr. Alvan Dame   Social History   Social History Narrative  . Not on file   Immunization History  Administered Date(s) Administered  . Influenza Whole 11/12/2012  . Influenza,inj,Quad PF,6+ Mos 12/11/2017, 12/25/2018  . Influenza-Unspecified 12/15/2013, 12/12/2015,  11/29/2016  . Td 11/03/2002  . Tdap 09/23/2015  . Zoster 08/12/2012  . Zoster Recombinat (Shingrix) 03/22/2018, 08/20/2018     Objective: Vital Signs: BP (!) 135/91 (BP Location: Left Arm, Patient Position: Sitting, Cuff Size: Large)   Pulse (!) 102   Resp 17   Ht 5\' 9"  (1.753 m)   Wt 248 lb 9.6 oz (112.8 kg)   BMI 36.71 kg/m    Physical Exam Vitals and nursing note reviewed.  Constitutional:      Appearance: She is well-developed.  HENT:     Head: Normocephalic and atraumatic.  Eyes:     Conjunctiva/sclera: Conjunctivae normal.  Cardiovascular:     Rate and Rhythm: Normal rate and regular rhythm.     Heart sounds: Normal heart sounds.  Pulmonary:     Effort: Pulmonary effort is normal.     Breath sounds: Normal breath sounds.  Abdominal:     General: Bowel sounds are normal.     Palpations: Abdomen is soft.  Musculoskeletal:     Cervical back: Normal range of motion.  Lymphadenopathy:     Cervical: No cervical adenopathy.  Skin:    General: Skin is warm and dry.  Capillary Refill: Capillary refill takes less than 2 seconds.  Neurological:     Mental Status: She is alert and oriented to person, place, and time.  Psychiatric:        Behavior: Behavior normal.      Musculoskeletal Exam: C-spine thoracic and lumbar spine with good range of motion.  Shoulder joints, elbow joints, wrist joints with good range of motion with no synovitis.  No MCP PIP or DIP synovitis was noted.  Hip joints, knee joints, ankles with good range of motion.  She had no tenderness over MTPs.  CDAI Exam: CDAI Score: 0.4  Patient Global: 2 mm; Provider Global: 2 mm Swollen: 0 ; Tender: 0  Joint Exam 09/03/2019   No joint exam has been documented for this visit   There is currently no information documented on the homunculus. Go to the Rheumatology activity and complete the homunculus joint exam.  Investigation: No additional findings.  Imaging: DG Thoracic Spine 2 View  Result  Date: 08/04/2019 CLINICAL DATA:  Fall EXAM: THORACIC SPINE 2 VIEWS COMPARISON:  None. FINDINGS: Vertebral body heights are maintained. Degenerative osteophytes at the mid and lower thoracic spine. IMPRESSION: Degenerative changes.  No acute osseous abnormality Electronically Signed   By: Donavan Foil M.D.   On: 08/04/2019 23:23   DG Lumbar Spine Complete  Result Date: 08/04/2019 CLINICAL DATA:  Lower extremity weakness and pain after fall EXAM: LUMBAR SPINE - COMPLETE 4+ VIEW COMPARISON:  08/19/2011 FINDINGS: S shaped scoliosis of the thoracolumbar spine. Trace anterolisthesis L4 on L5. Vertebral body heights are maintained. Mild degenerative changes at T12-L1, L1-L2, L2-L3, L3-L4 and L5-S1. Mild facet degenerative changes of the lower lumbar spine. IMPRESSION: Mild scoliosis and degenerative changes. No acute osseous abnormality. Electronically Signed   By: Donavan Foil M.D.   On: 08/04/2019 23:25   MR Cervical Spine Wo Contrast  Result Date: 08/05/2019 CLINICAL DATA:  Fall. Difficulty standing. EXAM: MRI CERVICAL SPINE WITHOUT CONTRAST TECHNIQUE: Multiplanar, multisequence MR imaging of the cervical spine was performed. No intravenous contrast was administered. COMPARISON:  Cervical spine MRI 05/21/2005 FINDINGS: Alignment: Physiologic. Vertebrae: No fracture, evidence of discitis, or bone lesion. C4-5 ACDF with posterior instrumented fusion at C4-6. Cord: Normal signal and morphology. Posterior Fossa, vertebral arteries, paraspinal tissues: Visualized posterior fossa is normal. Vertebral artery flow voids are preserved. No prevertebral soft tissue swelling. Disc levels: C1-2: Small central disc protrusion. C2-3: Normal disc space and facet joints. There is no spinal canal stenosis. No neural foraminal stenosis. C3-4: Small left subarticular disc protrusion. Progression of mild spinal canal stenosis. No neural foraminal stenosis. C4-5: ACDF with solid arthrodesis. There is no spinal canal stenosis. No  neural foraminal stenosis. C5-6: ACDF with solid arthrodesis. There is no spinal canal stenosis. No neural foraminal stenosis. C6-7: Solid anterior fusion. There is no spinal canal stenosis. No neural foraminal stenosis. C7-T1: Normal disc space and facet joints. There is no spinal canal stenosis. No neural foraminal stenosis. IMPRESSION: 1. No acute abnormality of the cervical spine. 2. Progression of mild spinal canal stenosis at C3-4 due to small left subarticular disc protrusion. 3. Solid arthrodesis at C4-C5, C5-6 and C6-7. Electronically Signed   By: Ulyses Jarred M.D.   On: 08/05/2019 03:38   MR THORACIC SPINE WO CONTRAST  Result Date: 08/05/2019 CLINICAL DATA:  Fall. Bilateral lower extremity weakness. EXAM: MRI THORACIC SPINE WITHOUT CONTRAST TECHNIQUE: Multiplanar, multisequence MR imaging of the thoracic spine was performed. No intravenous contrast was administered. COMPARISON:  Thoracic spine radiograph 08/04/2019  FINDINGS: Alignment:  Physiologic. Vertebrae: No fracture, evidence of discitis, or bone lesion. Cord: Small focus of left eccentric hyperintense T2-weighted signal within the spinal cord at the T5-6 level. Paraspinal and other soft tissues: Negative. Disc levels: T3-4: Small central disc protrusion. No stenosis. T5-6: Small disc bulge with facet hypertrophy. No stenosis. T10-11: Mild disc bulge. No spinal canal stenosis. IMPRESSION: 1. No acute abnormality of the thoracic spine. 2. 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 3. Mild thoracic degenerative disc disease without spinal canal or neural foraminal stenosis. Electronically Signed   By: Ulyses Jarred M.D.   On: 08/05/2019 03:45   MR LUMBAR SPINE WO CONTRAST  Result Date: 08/05/2019 CLINICAL DATA:  Bilateral lower extremity weakness and pain. Fall. EXAM: MRI LUMBAR SPINE WITHOUT CONTRAST TECHNIQUE: Multiplanar, multisequence MR imaging of the  lumbar spine was performed. No intravenous contrast was administered. COMPARISON:  None. FINDINGS: Segmentation:  Standard Alignment:  Grade 1 anterolisthesis at L3-4 and L4-5. Vertebrae: Facet edema at L3-4. No acute fracture. No discitis-osteomyelitis. Conus medullaris and cauda equina: Conus extends to the L1 level. Conus and cauda equina appear normal. Paraspinal and other soft tissues: Negative Disc levels: T12-L1: Minimal disc bulge. No stenosis. L1-L2: Minimal disc bulge. Normal facets. There is no spinal canal stenosis. No neural foraminal stenosis. L2-L3: Mild disc bulge with mild facet hypertrophy. Mild spinal canal stenosis. Mild right neural foraminal stenosis. L3-L4: Intermediate sized disc bulge with severe facet hypertrophy. Severe spinal canal stenosis. Severe right and mild left neural foraminal stenosis. L4-L5: Left asymmetric disc bulge and severe facet hypertrophy. Left lateral recess narrowing without central spinal canal stenosis. Mild left neural foraminal stenosis. L5-S1: Normal disc space and facet joints. There is no spinal canal stenosis. No neural foraminal stenosis. Visualized sacrum: Normal. IMPRESSION: 1. Severe spinal canal stenosis and right neural foraminal stenosis at L3-L4 due to combination of disc bulge and severe facet arthrosis. 2. Mild spinal canal stenosis at L2-L3. 3. Left lateral recess narrowing at L4-L5 could contribute to left L5 radiculopathy. Electronically Signed   By: Ulyses Jarred M.D.   On: 08/05/2019 03:52   DG Knee Complete 4 Views Right  Result Date: 08/05/2019 CLINICAL DATA:  Fall onto right knee with pain and swelling EXAM: RIGHT KNEE - COMPLETE 4+ VIEW COMPARISON:  04/05/2018 FINDINGS: Degenerative spurring especially at the medial compartment where there may be joint narrowing. No acute fracture, subluxation, or convincing joint effusion IMPRESSION: 1. No acute finding. 2. Tricompartmental osteoarthritis. Electronically Signed   By: Monte Fantasia M.D.    On: 08/05/2019 07:51    Recent Labs: Lab Results  Component Value Date   WBC 9.6 08/05/2019   HGB 11.7 (L) 08/05/2019   PLT 241 08/05/2019   NA 138 08/05/2019   K 3.5 08/05/2019   CL 101 08/05/2019   CO2 25 08/05/2019   GLUCOSE 103 (H) 08/05/2019   BUN 11 08/05/2019   CREATININE 0.95 08/05/2019   BILITOT 0.3 07/08/2019   ALKPHOS 74 07/08/2019   AST 44 (H) 07/08/2019   ALT 63 (H) 07/08/2019   PROT 7.3 07/08/2019   ALBUMIN 4.1 07/08/2019   CALCIUM 9.6 08/05/2019   GFRAA >60 08/05/2019   QFTBGOLDPLUS NEGATIVE 04/26/2018    Speciality Comments: No specialty comments available.  Procedures:  No procedures performed Allergies: Cymbalta [duloxetine hcl]   Assessment / Plan:     Visit Diagnoses: Rheumatoid arthritis involving multiple sites with positive  rheumatoid factor (H[facility]) - ANA 1:160 Cytoplasmic,RF 116, sed rate 22  High risk medication use - Methotrexate 0.5 ml every 7 days, folic acid 1 mg 2 tablets daily, and Plaquenil 200 mg 1 tablet by mouth twice daily.  - Plan: CBC with Differential/Platelet, COMPLETE METABOLIC PANEL WITH GFR  Primary osteoarthritis of right knee  S/P left TK revision  Traumatic incomplete tear of left rotator cuff, subsequent encounter  DDD (degenerative disc disease), lumbar  Anxiety and depression - she is on Celexa  History of hyperlipidemia  Essential hypertension  History of glaucoma  Other proteinuria  History of gastroesophageal reflux (GERD)  Vitamin D deficiency - Plan: VITAMIN D 25 Hydroxy (Vit-D Deficiency, Fractures)  Orders: Orders Placed This Encounter  Procedures  . CBC with Differential/Platelet  . COMPLETE METABOLIC PANEL WITH GFR  . VITAMIN D 25 Hydroxy (Vit-D Deficiency, Fractures)   No orders of the defined types were placed in this encounter.     Follow-Up Instructions: Return in about 3 months (around 12/04/2019) for Rheumatoid arthritis.   Bo Merino, MD  Note - This record has been  created using Editor, commissioning.  Chart creation errors have been sought, but may not always  have been located. Such creation errors do not reflect on  the standard of medical care.

## 2019-09-03 ENCOUNTER — Other Ambulatory Visit: Payer: Self-pay

## 2019-09-03 ENCOUNTER — Encounter: Payer: Self-pay | Admitting: Rheumatology

## 2019-09-03 ENCOUNTER — Ambulatory Visit (INDEPENDENT_AMBULATORY_CARE_PROVIDER_SITE_OTHER): Payer: Medicare HMO | Admitting: Rheumatology

## 2019-09-03 VITALS — BP 135/91 | HR 102 | Resp 17 | Ht 69.0 in | Wt 248.6 lb

## 2019-09-03 DIAGNOSIS — I10 Essential (primary) hypertension: Secondary | ICD-10-CM | POA: Diagnosis not present

## 2019-09-03 DIAGNOSIS — Z8669 Personal history of other diseases of the nervous system and sense organs: Secondary | ICD-10-CM

## 2019-09-03 DIAGNOSIS — M5136 Other intervertebral disc degeneration, lumbar region: Secondary | ICD-10-CM

## 2019-09-03 DIAGNOSIS — F419 Anxiety disorder, unspecified: Secondary | ICD-10-CM

## 2019-09-03 DIAGNOSIS — E559 Vitamin D deficiency, unspecified: Secondary | ICD-10-CM

## 2019-09-03 DIAGNOSIS — Z96652 Presence of left artificial knee joint: Secondary | ICD-10-CM | POA: Diagnosis not present

## 2019-09-03 DIAGNOSIS — F329 Major depressive disorder, single episode, unspecified: Secondary | ICD-10-CM

## 2019-09-03 DIAGNOSIS — Z79899 Other long term (current) drug therapy: Secondary | ICD-10-CM

## 2019-09-03 DIAGNOSIS — Z8639 Personal history of other endocrine, nutritional and metabolic disease: Secondary | ICD-10-CM

## 2019-09-03 DIAGNOSIS — S46012D Strain of muscle(s) and tendon(s) of the rotator cuff of left shoulder, subsequent encounter: Secondary | ICD-10-CM | POA: Diagnosis not present

## 2019-09-03 DIAGNOSIS — R808 Other proteinuria: Secondary | ICD-10-CM

## 2019-09-03 DIAGNOSIS — R69 Illness, unspecified: Secondary | ICD-10-CM | POA: Diagnosis not present

## 2019-09-03 DIAGNOSIS — M0579 Rheumatoid arthritis with rheumatoid factor of multiple sites without organ or systems involvement: Secondary | ICD-10-CM

## 2019-09-03 DIAGNOSIS — Z8719 Personal history of other diseases of the digestive system: Secondary | ICD-10-CM

## 2019-09-03 DIAGNOSIS — M1711 Unilateral primary osteoarthritis, right knee: Secondary | ICD-10-CM

## 2019-09-03 DIAGNOSIS — F32A Depression, unspecified: Secondary | ICD-10-CM

## 2019-09-03 NOTE — Patient Instructions (Addendum)
Please come in July for vitamin D level  Standing Labs We placed an order today for your standing lab work.   Please have your standing labs drawn in September  If possible, please have your labs drawn 2 weeks prior to your appointment so that the provider can discuss your results at your appointment.  We have open lab daily Monday through Thursday from 8:30-12:30 PM and 1:30-4:30 PM and Friday from 8:30-12:30 PM and 1:30-4:00 PM at the office of Dr. Bo Merino, [facility].   You may experience shorter wait times on Monday and Friday afternoons. The office is located at [address], [place], [place], [place] [address] No appointment is necessary.   Labs are drawn by [facility].  You may receive a bill from [facility] for your lab work.  If you wish to have your labs drawn at another location, please call the office 24 hours in advance to send orders.  If you have any questions regarding directions or hours of operation,  please call [phone].   As a reminder, please drink plenty of water prior to coming for your lab work. Thanks!

## 2019-09-04 LAB — COMPLETE METABOLIC PANEL WITH GFR
AG Ratio: 1.6 (calc) (ref 1.0–2.5)
ALT: 22 U/L (ref 6–29)
AST: 18 U/L (ref 10–35)
Albumin: 4.2 g/dL (ref 3.6–5.1)
Alkaline phosphatase (APISO): 85 U/L (ref 37–153)
BUN: 13 mg/dL (ref 7–25)
CO2: 25 mmol/L (ref 20–32)
Calcium: 9.5 mg/dL (ref 8.6–10.4)
Chloride: 104 mmol/L (ref 98–110)
Creat: 0.77 mg/dL (ref 0.50–0.99)
GFR, Est African American: 94 mL/min/{1.73_m2} (ref >=60)
GFR, Est Non African American: 81 mL/min/{1.73_m2} (ref >=60)
Globulin: 2.7 g/dL (calc) (ref 1.9–3.7)
Glucose, Bld: 103 mg/dL — ABNORMAL HIGH (ref 65–99)
Potassium: 3.7 mmol/L (ref 3.5–5.3)
Sodium: 140 mmol/L (ref 135–146)
Total Bilirubin: 0.5 mg/dL (ref 0.2–1.2)
Total Protein: 6.9 g/dL (ref 6.1–8.1)

## 2019-09-04 LAB — CBC WITH DIFFERENTIAL/PLATELET
Absolute Monocytes: 718 cells/uL (ref 200–950)
Basophils Absolute: 62 cells/uL (ref 0–200)
Basophils Relative: 0.9 %
Eosinophils Absolute: 338 cells/uL (ref 15–500)
Eosinophils Relative: 4.9 %
HCT: 35.3 % (ref 35.0–45.0)
Hemoglobin: 11.7 g/dL (ref 11.7–15.5)
Lymphs Abs: 773 cells/uL — ABNORMAL LOW (ref 850–3900)
MCH: 27.7 pg (ref 27.0–33.0)
MCHC: 33.1 g/dL (ref 32.0–36.0)
MCV: 83.5 fL (ref 80.0–100.0)
MPV: 11.1 fL (ref 7.5–12.5)
Monocytes Relative: 10.4 %
Neutro Abs: 5009 cells/uL (ref 1500–7800)
Neutrophils Relative %: 72.6 %
Platelets: 259 10*3/uL (ref 140–400)
RBC: 4.23 10*6/uL (ref 3.80–5.10)
RDW: 13.1 % (ref 11.0–15.0)
Total Lymphocyte: 11.2 %
WBC: 6.9 10*3/uL (ref 3.8–10.8)

## 2019-09-04 NOTE — Progress Notes (Signed)
CBC and CMP are stable.

## 2019-10-01 ENCOUNTER — Telehealth: Payer: Self-pay | Admitting: Rheumatology

## 2019-10-01 NOTE — Telephone Encounter (Signed)
Attempted to contact patient and left message for patient to call the office.  

## 2019-10-01 NOTE — Telephone Encounter (Signed)
Patient having a lot of pain, aching with both legs from the knees down. No known injury. Pain started last week, and has continued to increase. Patient was wondering if she could get something for pain? [facility] on [address] Please call to discuss.

## 2019-10-02 NOTE — Telephone Encounter (Signed)
Patient states she is having a lot of aching burning in bilateral legs from the knee down. Patient states it hurts to put pressure on them. Patient states she has been having the pain for about 2 weeks. Patient states she fell last month and was seen in the emergency room for the fall. Patient denies any swelling. Patient states she is on MTX 0.5 mL weekly and PLQ 200 mg twice daily . Patient denies missing any dose of MTX. Patient was last seen 09/03/2019. Please advise.

## 2019-10-02 NOTE — Telephone Encounter (Signed)
Attempted to contact the patient and left message for patient to call the office.  

## 2019-10-02 NOTE — Telephone Encounter (Signed)
Patient advised, spoke with Dr. Estanislado Pandy, and she recommends that the you follow up with your back specialist for further evaluation.  She has a history of spinal stenosis in the lumbar region

## 2019-10-02 NOTE — Telephone Encounter (Signed)
I spoke with Dr. Estanislado Pandy, and she recommends that the patient follow up with her back specialist for further evaluation.  She has a history of spinal stenosis in the lumbar region.

## 2019-10-04 ENCOUNTER — Other Ambulatory Visit: Payer: Self-pay | Admitting: Internal Medicine

## 2019-10-04 NOTE — Telephone Encounter (Signed)
Please refill as per office routine med refill policy (all routine meds refilled for 3 mo or monthly per pt preference up to one year from last visit, then month to month grace period for 3 mo, then further med refills will have to be denied)  

## 2019-10-08 ENCOUNTER — Other Ambulatory Visit: Payer: Self-pay | Admitting: Rheumatology

## 2019-10-08 DIAGNOSIS — M0579 Rheumatoid arthritis with rheumatoid factor of multiple sites without organ or systems involvement: Secondary | ICD-10-CM

## 2019-10-11 ENCOUNTER — Other Ambulatory Visit: Payer: Self-pay | Admitting: Gastroenterology

## 2019-10-17 ENCOUNTER — Telehealth: Payer: Self-pay | Admitting: Internal Medicine

## 2019-10-17 NOTE — Telephone Encounter (Signed)
Done erx 

## 2019-10-20 ENCOUNTER — Other Ambulatory Visit: Payer: Self-pay | Admitting: Family

## 2019-10-20 MED ORDER — ZOLPIDEM TARTRATE 10 MG PO TABS: 10.0000 mg | ORAL_TABLET | Freq: Every day | ORAL | 0 refills | Status: DC

## 2019-10-20 NOTE — Telephone Encounter (Signed)
New message:   Pt states this medication was sent to the wrong pharmacy. Please re-send to [id number] - [place], [place] - [address]

## 2019-10-20 NOTE — Telephone Encounter (Signed)
Since Dr Jenny Reichmann is out of office today, can you please resend this to the requested pharmacy?  [id number] - [place], [place] - [address]. Thank you

## 2019-11-03 ENCOUNTER — Ambulatory Visit: Payer: Medicare HMO | Admitting: Gastroenterology

## 2019-11-05 ENCOUNTER — Other Ambulatory Visit: Payer: Self-pay | Admitting: Internal Medicine

## 2019-11-06 ENCOUNTER — Telehealth: Payer: Self-pay | Admitting: Rheumatology

## 2019-11-06 DIAGNOSIS — M0579 Rheumatoid arthritis with rheumatoid factor of multiple sites without organ or systems involvement: Secondary | ICD-10-CM

## 2019-11-06 MED ORDER — HYDROXYCHLOROQUINE SULFATE 200 MG PO TABS: 200.0000 mg | ORAL_TABLET | Freq: Two times a day (BID) | ORAL | 0 refills | Status: DC

## 2019-11-06 NOTE — Telephone Encounter (Signed)
Prescription resent to [facility].   Attempted to contact the patient and left message for patient to call the office.

## 2019-11-06 NOTE — Telephone Encounter (Signed)
Please resend the Generic Plaquenil to the correct pharmacy. Patient requested [name]. She said we called around last time, and [name] was cheaper.

## 2019-11-06 NOTE — Telephone Encounter (Signed)
Patient needs refill on generic Plaquenil 200 mg sent to [facility]. Patient is out. Per patient her rt knee is swollen, and painful.

## 2019-11-06 NOTE — Telephone Encounter (Signed)
Please schedule an appointment to discuss other treatment options if she is having breakthrough pain and recurrent flares.    We can also discuss a right knee joint cortisone injection at that visit.

## 2019-11-06 NOTE — Progress Notes (Signed)
Office Visit Note  Patient: Pamela [name]             Date of Birth: 1955-08-11           MRN: [id number]             PCP: Biagio Borg, MD Referring: Biagio Borg, MD Visit Date: 11/10/2019 Occupation: @GUAROCC @  Subjective:  Discussed treatment options  History of Present Illness: Pamela [name] is a 64 y.o. female with history of seropositive rheumatoid arthritis and osteoarthritis.  She is on methotrexate 0.5 mL sq injections once weekly, folic acid, and Plaquenil 200 mg 1 tablet twice daily.  She states that she experiences breakthrough pain leading up to each methotrexate injection on a weekly basis.  She presents today with right knee joint pain.  She has been having difficulty ambulating due to severity of pain.  She is also noticed intermittent joint swelling and warmth.  She has been having some discomfort in the left knee which is replaced.  She denies any other joint pain or joint swelling at this time.   Activities of Daily Living:  Patient reports morning stiffness for 15-20 minutes.   Patient Reports nocturnal pain.  Difficulty dressing/grooming: Denies Difficulty climbing stairs: Reports Difficulty getting out of chair: Reports Difficulty using hands for taps, buttons, cutlery, and/or writing: Denies  Review of Systems  Constitutional: Positive for fatigue.  HENT: Negative for mouth sores, mouth dryness and nose dryness.   Eyes: Negative for itching and dryness.  Respiratory: Negative for shortness of breath and difficulty breathing.   Cardiovascular: Negative for chest pain and palpitations.  Gastrointestinal: Negative for blood in stool, constipation and diarrhea.  Endocrine: Negative for increased urination.  Genitourinary: Negative for difficulty urinating.  Musculoskeletal: Positive for arthralgias, joint pain, joint swelling, muscle weakness and morning stiffness. Negative for myalgias, muscle tenderness and myalgias.  Skin: Negative for color change, rash  and redness.  Allergic/Immunologic: Negative for susceptible to infections.  Neurological: Positive for numbness and memory loss. Negative for dizziness, headaches and weakness.  Hematological: Positive for bruising/bleeding tendency.  Psychiatric/Behavioral: Positive for sleep disturbance. Negative for confusion.    PMFS History:  Patient Active Problem List   Diagnosis Date Noted   Spinal stenosis of lumbar region 08/05/2019   Inability to walk 08/05/2019   Hives 07/08/2019   History of glaucoma 04/24/2019   Bursitis of left hip 12/25/2018   MGUS (monoclonal gammopathy of unknown significance) 05/11/2018   Rheumatoid arthritis involving multiple sites with positive rheumatoid factor ([place]) 05/03/2018   High risk medication use 05/03/2018   Status post total left knee replacement 05/03/2018   DDD (degenerative disc disease), lumbar 05/03/2018   Primary osteoarthritis of right knee 04/05/2018   Arthritis of left wrist 12/19/2017   Left rotator cuff tear 12/19/2017   Left wrist pain 12/12/2017   Left shoulder pain 08/22/2017   Acute pain of right knee 04/17/2017   Pain of left calf 04/13/2017   S/P left TK revision 10/09/2016   Hyperglycemia 09/23/2015   Anxiety state 09/23/2015   Abnormal urine odor 08/25/2015   GERD without esophagitis 08/25/2015   Upper abdominal pain 04/16/2015   Early satiety 04/16/2015   Dysuria 04/16/2015   Encounter for well adult exam with abnormal findings 06/18/2014   Greater trochanteric bursitis of right hip 01/06/2013   Left lumbar radiculitis 04/03/2012   Eczema 04/03/2012   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   BURSITIS, RIGHT HIP 08/25/2009   INSOMNIA-SLEEP DISORDER-UNSPEC 08/25/2009  FATIGUE 08/25/2009   Cervicalgia 08/14/2007   Depression 03/15/2007   HLD (hyperlipidemia) 12/18/2006   Essential hypertension 12/18/2006   Allergic rhinitis 12/18/2006   RENAL CALCULUS, HX OF 12/18/2006    Past Medical History:   Diagnosis Date   ALLERGIC RHINITIS 12/18/2006   Qualifier: Diagnosis of  By: Danny Lawless CMA, [place]     Anxiety    Arthritis    DEPRESSION, SITUATIONAL 03/15/2007   Qualifier: Diagnosis of  By: Wynona Luna    GERD (gastroesophageal reflux disease)    Headache    occasional   History of kidney stones    HYPERLIPIDEMIA 12/18/2006   Qualifier: History of  By: [place], [place]     Hypertension    HYPERTENSION 12/18/2006   Qualifier: Diagnosis of  By: [place], [place]     INSOMNIA-SLEEP [place] 08/25/2009   Qualifier: Diagnosis of  By: Jenny Reichmann MD, [place]    Pain in knee region after total knee replacement ([place]) 04/03/2012   Pneumonia    RENAL CALCULUS, HX OF 12/18/2006   Qualifier: Diagnosis of  By: Danny Lawless CMA, [place]      Family History  Problem Relation Age of Onset   Stroke Mother    Heart disease Father    Lung cancer Sister    Lung cancer Brother    Healthy Daughter    Healthy Daughter    Healthy Daughter    Past Surgical History:  Procedure Laterality Date   CERVICAL DISC SURGERY     x 3   CONVERSION TO TOTAL KNEE Left 10/09/2016   Procedure: Conversion left uni compartment arthroplasty to total knee arthroplasty;  Surgeon: Paralee Cancel, MD;  Location: [facility];  Service: Orthopedics;  Laterality: Left;  90 mins   KNEE ARTHROSCOPY     partial   total left knee arthoplasty     10/09/16 Dr. Alvan Dame   Social History   Social History Narrative   Not on file   Immunization History  Administered Date(s) Administered   Influenza Whole 11/12/2012   Influenza,inj,Quad PF,6+ Mos 12/11/2017, 12/25/2018   Influenza-Unspecified 12/15/2013, 12/12/2015, 11/29/2016   Td 11/03/2002   Tdap 09/23/2015   Zoster 08/12/2012   Zoster Recombinat (Shingrix) 03/22/2018, 08/20/2018     Objective: Vital Signs: BP 131/85 (BP Location: Left Arm, Patient Position: Sitting, Cuff Size: Large)    Pulse 91    Resp 16    Ht 5\' 9"  (1.753 m)    Wt 243 lb  3.2 oz (110.3 kg)    BMI 35.91 kg/m    Physical Exam Vitals and nursing note reviewed.  Constitutional:      Appearance: She is well-developed.  HENT:     Head: Normocephalic and atraumatic.  Eyes:     Conjunctiva/sclera: Conjunctivae normal.  Pulmonary:     Effort: Pulmonary effort is normal.  Abdominal:     Palpations: Abdomen is soft.  Musculoskeletal:     Cervical back: Normal range of motion.  Skin:    General: Skin is warm and dry.     Capillary Refill: Capillary refill takes less than 2 seconds.  Neurological:     Mental Status: She is alert and oriented to person, place, and time.  Psychiatric:        Behavior: Behavior normal.      Musculoskeletal Exam: C-spine, thoracic spine, and lumbar spine good ROM.  Shoulder joints, elbow joints, wrist joints, MCPs, PIPs, and DIPs good ROM with no synovitis.  Complete fist formation bilaterally. Hip joints  good ROM.  Right knee joint pain with ROM.  Warmth and crepitus of the right knee joint noted.  Left knee replacement has good ROM with discomfort.  Ankle joints good ROM with no tenderness or inflammation.   CDAI Exam: CDAI Score: 5.4  Patient Global: 8 mm; Provider Global: 6 mm Swollen: 2 ; Tender: 2  Joint Exam 11/10/2019      Right  Left  Knee  Swollen Tender  Swollen Tender     Investigation: No additional findings.  Imaging: No results found.  Recent Labs: Lab Results  Component Value Date   WBC 6.9 09/03/2019   HGB 11.7 09/03/2019   PLT 259 09/03/2019   NA 140 09/03/2019   K 3.7 09/03/2019   CL 104 09/03/2019   CO2 25 09/03/2019   GLUCOSE 103 (H) 09/03/2019   BUN 13 09/03/2019   CREATININE 0.77 09/03/2019   BILITOT 0.5 09/03/2019   ALKPHOS 74 07/08/2019   AST 18 09/03/2019   ALT 22 09/03/2019   PROT 6.9 09/03/2019   ALBUMIN 4.1 07/08/2019   CALCIUM 9.5 09/03/2019   GFRAA 94 09/03/2019   QFTBGOLDPLUS NEGATIVE 04/26/2018    Speciality Comments: PLQ Eye Exam: 03/26/2019 WNL @ [facility]  OD PA FOllow up in 1 year  Procedures:  Large Joint Inj: R knee on 11/10/2019 11:07 AM Indications: pain Details: 27 G 1.5 in needle, medial approach  Arthrogram: No  Medications: 1.5 mL lidocaine 1 %; 40 mg triamcinolone acetonide 40 MG/ML Aspirate: 0 mL Outcome: tolerated well, no immediate complications Procedure, treatment alternatives, risks and benefits explained, specific risks discussed. Consent was given by the patient. Immediately prior to procedure a time out was called to verify the correct patient, procedure, equipment, support staff and site/side marked as required. Patient was prepped and draped in the usual sterile fashion.     Allergies: Cymbalta [duloxetine hcl]   Assessment / Plan:     Visit Diagnoses: Rheumatoid arthritis involving multiple sites with positive rheumatoid factor (HCC) - ANA 1:160 Cytoplasmic,RF 116, sed rate 22: She presents today with pain and inflammation in the right knee joint.  She has been experiencing intermittent discomfort, inflammation and difficulty ambulating due to the severity of the pain in her right knee.  She is also been having increased discomfort in the left knee which is replaced.  She has been having breakthrough pain several days prior to her methotrexate injections on a weekly basis.  She has been on methotrexate 0.5 mL subcu injections once weekly and Plaquenil 200 mg 1 tablet twice daily.  She is on the reduced dose of methotrexate due to history of elevated LFTs.  We are unable to further increase the dose of methotrexate to help with her frequent flares.  We discussed different treatment options today.  Indications, contraindications, potential side effects of Humira were discussed.  We will plan for Humira 40 mg subcutaneous injections once every 14 days.  All questions were addressed and consent was obtained.  We will apply for patient assistance.  She will continue on methotrexate and Plaquenil as prescribed for now until she starts  on Humira.  She will follow up in 2-3 months.   Counseled patient that Humira is a TNF blocking agent.  Counseled patient on purpose, proper use, and adverse effects of Humira.  Reviewed the most common adverse effects including infections, headache, and injection site reactions. Discussed that there is the possibility of an increased risk of malignancy but it is not well understood if  this increased risk is due to the medication or the disease state.  Advised patient to get yearly dermatology exams due to risk of skin cancer. Counseled patient that Humira should be held prior to scheduled surgery.  Counseled patient to avoid live vaccines while on Humira.  Advised patient to get annual influenza vaccine and the pneumococcal vaccine as indicated.    Reviewed the importance of regular labs while on Humira therapy.  Standing orders placed.  Provided patient with medication education material and answered all questions.  Patient consented to Humira.  Will upload consent into the media tab.  Reviewed storage instructions of Humira.  Advised initial injection must be administered in office.  Patient verbalized understanding.  Dose will be for rheumatoid arthritis Humira 40 mg every 14 days.  Prescription pending lab results and/or insurance approval.  High risk medication use -Applying for Humira 40 mg subcutaneous injections every 14 days.  She will continue taking methotrexate 0.5 ml sq injections every 7 days, folic acid 1 mg 2 tablets daily, and Plaquenil 200 mg 1 tablet by mouth twice daily.  CBC and CMP ordered on 09/03/2019.  Orders for CBC and CMP were released today.  She will return for lab work in 1 month and every 3 months after starting on Humira.  TB gold was negative on 04/26/2018.  Order for TB gold was placed today and will continue to be monitored yearly.- Plan: QuantiFERON-TB Gold Plus, CBC with Differential/Platelet, COMPLETE METABOLIC PANEL WITH GFR She has not received the COVID-19  vaccinations and is not planning on it at this time.  She was encouraged to continue to wear a mask and social distance.  We discussed that if she develops a COVID-19 infection she is to notify us or her PCP in order to receive the antibody infusion.  She voiced understanding.  She was advised to hold Humira anytime she has an infection and to resume once the infection has completely cleared.    Screening for tuberculosis -TB gold was negative on 04/26/2018.  She is due to update TB Gold today.  Order was released.  Plan: QuantiFERON-TB Gold Plus  Primary osteoarthritis of right knee: She presents today with severe pain in her right knee joint.  She has been experiencing intermittent inflammation and difficulty ambulating.  According to the patient she has been experiencing increased breakthrough pain leading up to H injection of methotrexate on a weekly basis.  X-rays of the right knee joint were obtained on 08/05/2019 which revealed tricompartmental osteoarthritis.  She had a cortisone injection performed on 04/05/2018.  She requested a repeat cortisone injection today.  She tolerated procedure well.  The procedure note was completed above.  Aftercare was discussed.  She was given a handout of knee joint exercises to perform.  S/P left TK revision: She experiences intermittent discomfort in the left knee which is replaced.  She has good range of motion on exam today.  She has been experiencing a clicking sensation intermittently.  She was advised to schedule appointment with her orthopedist for further evaluation.  Traumatic incomplete tear of left rotator cuff, subsequent encounter: She has good range of motion with no discomfort on exam.  Trochanteric bursitis, left hip: Resolved.  DDD (degenerative disc disease), lumbar: She is not experiencing any lower back pain at this time.  She has no symptoms of radiculopathy.  Other medical conditions are listed as follows:   Anxiety and  depression  History of hyperlipidemia  Essential hypertension  History of glaucoma  Other proteinuria  History of gastroesophageal reflux (GERD)  Vitamin D deficiency   Orders: Orders Placed This Encounter  Procedures   Large Joint Inj   QuantiFERON-TB Gold Plus   CBC with Differential/Platelet   COMPLETE METABOLIC PANEL WITH GFR   No orders of the defined types were placed in this encounter.   Face-to-face time spent with patient was 30 minutes. Greater than 50% of time was spent in counseling and coordination of care.  Follow-Up Instructions: Return in about 3 months (around 02/10/2020) for Rheumatoid arthritis, Osteoarthritis.   Hazel Sams, PA-C   I examined and evaluated the patient with Hazel Sams PA.  Patient continues to have pain and discomfort in her joints despite being on current combination.  She has warmth and swelling on her knee joints on my examination.  We discussed switching to Humira.  Indications side effects contraindications were discussed.  The plan of care was discussed as noted above.  Bo Merino, MD Note - This record has been created using Editor, commissioning.  Chart creation errors have been sought, but may not always  have been located. Such creation errors do not reflect on  the standard of medical care.

## 2019-11-06 NOTE — Telephone Encounter (Signed)
Spoke with patient and advised prescription has been sent to [facility]. Patient scheduled for an appointment to discuss treatment options and a possible cortisone injection.

## 2019-11-06 NOTE — Telephone Encounter (Signed)
Patient states she is having swelling, pain and popping in her right knee. Patient states she has been having this trouble for "a little while". Patient states she has been taking PLQ as prescribe. Patient is also taking MTX as prescribed. Patient states she feels like the MTX wears off prior to her next dose. Please advise.       Last Visit: 09/03/2019 Next Visit: 12/04/2019 Labs: 09/03/2019 CBC and CMP are stable. Eye exam:  03/26/2019 WNL  Current Dose per office note 09/03/2019: Plaquenil 200 mg 1 tablet by mouth twice daily DX:  Rheumatoid arthritis involving multiple sites with positive rheumatoid factor   Okay to refill per Dr. Estanislado Pandy

## 2019-11-10 ENCOUNTER — Telehealth: Payer: Self-pay | Admitting: Pharmacist

## 2019-11-10 ENCOUNTER — Other Ambulatory Visit: Payer: Self-pay

## 2019-11-10 ENCOUNTER — Ambulatory Visit (INDEPENDENT_AMBULATORY_CARE_PROVIDER_SITE_OTHER): Payer: Medicare HMO | Admitting: Rheumatology

## 2019-11-10 ENCOUNTER — Encounter: Payer: Self-pay | Admitting: Rheumatology

## 2019-11-10 VITALS — BP 131/85 | HR 91 | Resp 16 | Ht 69.0 in | Wt 243.2 lb

## 2019-11-10 DIAGNOSIS — Z8719 Personal history of other diseases of the digestive system: Secondary | ICD-10-CM

## 2019-11-10 DIAGNOSIS — Z8639 Personal history of other endocrine, nutritional and metabolic disease: Secondary | ICD-10-CM | POA: Diagnosis not present

## 2019-11-10 DIAGNOSIS — M1711 Unilateral primary osteoarthritis, right knee: Secondary | ICD-10-CM | POA: Diagnosis not present

## 2019-11-10 DIAGNOSIS — Z79899 Other long term (current) drug therapy: Secondary | ICD-10-CM

## 2019-11-10 DIAGNOSIS — Z111 Encounter for screening for respiratory tuberculosis: Secondary | ICD-10-CM

## 2019-11-10 DIAGNOSIS — M51369 Other intervertebral disc degeneration, lumbar region without mention of lumbar back pain or lower extremity pain: Secondary | ICD-10-CM

## 2019-11-10 DIAGNOSIS — Z96652 Presence of left artificial knee joint: Secondary | ICD-10-CM

## 2019-11-10 DIAGNOSIS — M0579 Rheumatoid arthritis with rheumatoid factor of multiple sites without organ or systems involvement: Secondary | ICD-10-CM | POA: Diagnosis not present

## 2019-11-10 DIAGNOSIS — F419 Anxiety disorder, unspecified: Secondary | ICD-10-CM

## 2019-11-10 DIAGNOSIS — R69 Illness, unspecified: Secondary | ICD-10-CM | POA: Diagnosis not present

## 2019-11-10 DIAGNOSIS — I10 Essential (primary) hypertension: Secondary | ICD-10-CM

## 2019-11-10 DIAGNOSIS — M5136 Other intervertebral disc degeneration, lumbar region: Secondary | ICD-10-CM

## 2019-11-10 DIAGNOSIS — F329 Major depressive disorder, single episode, unspecified: Secondary | ICD-10-CM

## 2019-11-10 DIAGNOSIS — S46012D Strain of muscle(s) and tendon(s) of the rotator cuff of left shoulder, subsequent encounter: Secondary | ICD-10-CM

## 2019-11-10 DIAGNOSIS — M7062 Trochanteric bursitis, left hip: Secondary | ICD-10-CM

## 2019-11-10 DIAGNOSIS — E559 Vitamin D deficiency, unspecified: Secondary | ICD-10-CM

## 2019-11-10 DIAGNOSIS — Z8669 Personal history of other diseases of the nervous system and sense organs: Secondary | ICD-10-CM

## 2019-11-10 DIAGNOSIS — R808 Other proteinuria: Secondary | ICD-10-CM

## 2019-11-10 DIAGNOSIS — F32A Depression, unspecified: Secondary | ICD-10-CM

## 2019-11-10 NOTE — Telephone Encounter (Signed)
Submitted a Prior Authorization request to [employer] for [place] via Cover My Meds. Will update once we receive a response.   (KeyAdella Nissen) - [id number]

## 2019-11-10 NOTE — Telephone Encounter (Signed)
Patient received PAP application at office. Complete pro ID# [id number]

## 2019-11-10 NOTE — Patient Instructions (Signed)
Standing Labs We placed an order today for your standing lab work.   Please have your standing labs drawn in 1 month then every 3 months   If possible, please have your labs drawn 2 weeks prior to your appointment so that the provider can discuss your results at your appointment.  We have open lab daily Monday through Thursday from 8:30-12:30 PM and 1:30-4:30 PM and Friday from 8:30-12:30 PM and 1:30-4:00 PM at the office of Dr. Bo Merino, [facility].   Please be advised, patients with office appointments requiring lab work will take precedents over walk-in lab work.  If possible, please come for your lab work on Monday and Friday afternoons, as you may experience shorter wait times. The office is located at [address] [place], [place], [place] [address] No appointment is necessary.   Labs are drawn by [facility]. Please bring your co-pay at the time of your lab draw.  You may receive a bill from [facility] for your lab work.  If you wish to have your labs drawn at another location, please call the office 24 hours in advance to send orders.  If you have any questions regarding directions or hours of operation,  please call [phone].   As a reminder, please drink plenty of water prior to coming for your lab work. Thanks!   Adalimumab Injection What is this medicine? ADALIMUMAB (a dal AYE mu mab) is used to treat rheumatoid and psoriatic arthritis. It is also used to treat ankylosing spondylitis, Crohn's disease, ulcerative colitis, plaque psoriasis, hidradenitis suppurativa, and uveitis. This medicine may be used for other purposes; ask your health care provider or pharmacist if you have questions. COMMON BRAND NAME(S): CYLTEZO, Humira What should I tell my health care provider before I take this medicine? They need to know if you have any of these conditions:  diabetes  heart disease  hepatitis B or history of hepatitis B infection  immune system  problems  infection or history of infections  multiple sclerosis  recently received or scheduled to receive a vaccine  scheduled to have surgery  tuberculosis, a positive skin test for tuberculosis or have recently been in close contact with someone who has tuberculosis  an unusual reaction to adalimumab, other medicines, mannitol, latex, rubber, foods, dyes, or preservatives  pregnant or trying to get pregnant  breast-feeding How should I use this medicine? This medicine is for injection under the skin. You will be taught how to prepare and give this medicine. Use exactly as directed. Take your medicine at regular intervals. Do not take your medicine more often than directed. A special MedGuide will be given to you by the pharmacist with each prescription and refill. Be sure to read this information carefully each time. It is important that you put your used needles and syringes in a special sharps container. Do not put them in a trash can. If you do not have a sharps container, call your pharmacist or healthcare provider to get one. Talk to your pediatrician regarding the use of this medicine in children. While this drug may be prescribed for children as young as 2 years for selected conditions, precautions do apply. The manufacturer of the medicine offers free information to patients and their health care partners. Call [phone] for more information. Overdosage: If you think you have taken too much of this medicine contact a poison control center or emergency room at once. NOTE: This medicine is only for you. Do not share this medicine with  others. What if I miss a dose? If you miss a dose, take it as soon as you can. If it is almost time for your next dose, take only that dose. Do not take double or extra doses. Give the next dose when your next scheduled dose is due. Call your doctor or health care professional if you are not sure how to handle a missed dose. What may interact  with this medicine? Do not take this medicine with any of the following medications:  abatacept  anakinra  etanercept  infliximab  live virus vaccines  rilonacept This medicine may also interact with the following medications:  vaccines This list may not describe all possible interactions. Give your health care provider a list of all the medicines, herbs, non-prescription drugs, or dietary supplements you use. Also tell them if you smoke, drink alcohol, or use illegal drugs. Some items may interact with your medicine. What should I watch for while using this medicine? Visit your doctor or health care professional for regular checks on your progress. Tell your doctor or healthcare professional if your symptoms do not start to get better or if they get worse. You will be tested for tuberculosis (TB) before you start this medicine. If your doctor prescribes any medicine for TB, you should start taking the TB medicine before starting this medicine. Make sure to finish the full course of TB medicine. Call your doctor or health care professional if you get a cold or other infection while receiving this medicine. Do not treat yourself. This medicine may decrease your body's ability to fight infection. Talk to your doctor about your risk of cancer. You may be more at risk for certain types of cancers if you take this medicine. What side effects may I notice from receiving this medicine? Side effects that you should report to your doctor or health care professional as soon as possible:  allergic reactions like skin rash, itching or hives, swelling of the face, lips, or tongue  breathing problems  changes in vision  chest pain  fever, chills, or any other sign of infection  numbness or tingling  red, scaly patches or raised bumps on the skin  swelling of the ankles  swollen lymph nodes in the neck, underarm, or groin areas  unexplained weight loss  unusual bleeding or  bruising  unusually weak or tired Side effects that usually do not require medical attention (report to your doctor or health care professional if they continue or are bothersome):  headache  nausea  redness, itching, swelling, or bruising at site where injected This list may not describe all possible side effects. Call your doctor for medical advice about side effects. You may report side effects to FDA at 1-800-FDA-1088. Where should I keep my medicine? Keep out of the reach of children. Store in the original container and in the refrigerator between 2 and 8 degrees C (36 and 46 degrees F). Do not freeze. The product may be stored in a cool carrier with an ice pack, if needed. Protect from light. Throw away any unused medicine after the expiration date. NOTE: This sheet is a summary. It may not cover all possible information. If you have questions about this medicine, talk to your doctor, pharmacist, or health care provider.  2020 Elsevier/Gold Standard (2017-12-17 13:22:46)   COVID-19 vaccine recommendations:   [id number] vaccine is recommended for everyone (unless you are allergic to a vaccine component), even if you are on a medication that suppresses your immune system.  If you are on Methotrexate, Cellcept (mycophenolate), Rinvoq, Morrie Sheldon, and Olumiant- hold the medication for 1 week after each vaccine. Hold Methotrexate for 2 weeks after the single dose COVID-19 vaccine.   If you are on Orencia subcutaneous injection - hold medication one week prior to and one week after the first COVID-19 vaccine dose (only).   If you are on Orencia IV infusions- time vaccination administration so that the first COVID-19 vaccination will occur four weeks after the infusion and postpone the subsequent infusion by one week.   If you are on Cyclophosphamide or Rituxan infusions please contact your doctor prior to receiving the COVID-19 vaccine.   Do not take Tylenol or any anti-inflammatory  medications (NSAIDs) 24 hours prior to the COVID-19 vaccination.   There is no direct evidence about the efficacy of the COVID-19 vaccine in individuals who are on medications that suppress the immune system.   Even if you are fully vaccinated, and you are on any medications that suppress your immune system, please continue to wear a mask, maintain at least six feet social distance and practice hand hygiene.   If you develop a COVID-19 infection, please contact your PCP or our office to determine if you need antibody infusion.  The booster vaccine is now available for immunocompromised patients. It is advised that if you had Pfizer vaccine you should get Coca-Cola booster.  If you had a Moderna vaccine then you should get a Moderna booster. Johnson and Wynetta Emery does not have a booster vaccine at this time.  Please see the following web sites for updated information.   https://www.rheumatology.org/Portals/0/Files/COVID-19-Vaccination-Patient-Resources.pdf  https://www.rheumatology.org/About-Us/Newsroom/Press-Releases/ID/1159   Journal for Nurse Practitioners, 15(4), 978 413 2985. Retrieved December 17, 2017 from http://clinicalkey.com/nursing">  Knee Exercises Ask your health care provider which exercises are safe for you. Do exercises exactly as told by your health care provider and adjust them as directed. It is normal to feel mild stretching, pulling, tightness, or discomfort as you do these exercises. Stop right away if you feel sudden pain or your pain gets worse. Do not begin these exercises until told by your health care provider. Stretching and range-of-motion exercises These exercises warm up your muscles and joints and improve the movement and flexibility of your knee. These exercises also help to relieve pain and swelling. Knee extension, prone 1. Lie on your abdomen (prone position) on a bed. 2. Place your left / right knee just beyond the edge of the surface so your knee is not on the bed.  You can put a towel under your left / right thigh just above your kneecap for comfort. 3. Relax your leg muscles and allow gravity to straighten your knee (extension). You should feel a stretch behind your left / right knee. 4. Hold this position for __________ seconds. 5. Scoot up so your knee is supported between repetitions. Repeat __________ times. Complete this exercise __________ times a day. Knee flexion, active  1. Lie on your back with both legs straight. If this causes back discomfort, bend your left / right knee so your foot is flat on the floor. 2. Slowly slide your left / right heel back toward your buttocks. Stop when you feel a gentle stretch in the front of your knee or thigh (flexion). 3. Hold this position for __________ seconds. 4. Slowly slide your left / right heel back to the starting position. Repeat __________ times. Complete this exercise __________ times a day. Quadriceps stretch, prone  1. Lie on your abdomen on a firm surface, such as  a bed or padded floor. 2. Bend your left / right knee and hold your ankle. If you cannot reach your ankle or pant leg, loop a belt around your foot and grab the belt instead. 3. Gently pull your heel toward your buttocks. Your knee should not slide out to the side. You should feel a stretch in the front of your thigh and knee (quadriceps). 4. Hold this position for __________ seconds. Repeat __________ times. Complete this exercise __________ times a day. Hamstring, supine 1. Lie on your back (supine position). 2. Loop a belt or towel over the ball of your left / right foot. The ball of your foot is on the walking surface, right under your toes. 3. Straighten your left / right knee and slowly pull on the belt to raise your leg until you feel a gentle stretch behind your knee (hamstring). ? Do not let your knee bend while you do this. ? Keep your other leg flat on the floor. 4. Hold this position for __________ seconds. Repeat  __________ times. Complete this exercise __________ times a day. Strengthening exercises These exercises build strength and endurance in your knee. Endurance is the ability to use your muscles for a long time, even after they get tired. Quadriceps, isometric This exercise stretches the muscles in front of your thigh (quadriceps) without moving your knee joint (isometric). 1. Lie on your back with your left / right leg extended and your other knee bent. Put a rolled towel or small pillow under your knee if told by your health care provider. 2. Slowly tense the muscles in the front of your left / right thigh. You should see your kneecap slide up toward your hip or see increased dimpling just above the knee. This motion will push the back of the knee toward the floor. 3. For __________ seconds, hold the muscle as tight as you can without increasing your pain. 4. Relax the muscles slowly and completely. Repeat __________ times. Complete this exercise __________ times a day. Straight leg raises This exercise stretches the muscles in front of your thigh (quadriceps) and the muscles that move your hips (hip flexors). 1. Lie on your back with your left / right leg extended and your other knee bent. 2. Tense the muscles in the front of your left / right thigh. You should see your kneecap slide up or see increased dimpling just above the knee. Your thigh may even shake a bit. 3. Keep these muscles tight as you raise your leg 4-6 inches (10-15 cm) off the floor. Do not let your knee bend. 4. Hold this position for __________ seconds. 5. Keep these muscles tense as you lower your leg. 6. Relax your muscles slowly and completely after each repetition. Repeat __________ times. Complete this exercise __________ times a day. Hamstring, isometric 1. Lie on your back on a firm surface. 2. Bend your left / right knee about __________ degrees. 3. Dig your left / right heel into the surface as if you are trying to  pull it toward your buttocks. Tighten the muscles in the back of your thighs (hamstring) to "dig" as hard as you can without increasing any pain. 4. Hold this position for __________ seconds. 5. Release the tension gradually and allow your muscles to relax completely for __________ seconds after each repetition. Repeat __________ times. Complete this exercise __________ times a day. Hamstring curls If told by your health care provider, do this exercise while wearing ankle weights. Begin with __________ lb weights.  Then increase the weight by 1 lb (0.5 kg) increments. Do not wear ankle weights that are more than __________ lb. 1. Lie on your abdomen with your legs straight. 2. Bend your left / right knee as far as you can without feeling pain. Keep your hips flat against the floor. 3. Hold this position for __________ seconds. 4. Slowly lower your leg to the starting position. Repeat __________ times. Complete this exercise __________ times a day. Squats This exercise strengthens the muscles in front of your thigh and knee (quadriceps). 1. Stand in front of a table, with your feet and knees pointing straight ahead. You may rest your hands on the table for balance but not for support. 2. Slowly bend your knees and lower your hips like you are going to sit in a chair. ? Keep your weight over your heels, not over your toes. ? Keep your lower legs upright so they are parallel with the table legs. ? Do not let your hips go lower than your knees. ? Do not bend lower than told by your health care provider. ? If your knee pain increases, do not bend as low. 3. Hold the squat position for __________ seconds. 4. Slowly push with your legs to return to standing. Do not use your hands to pull yourself to standing. Repeat __________ times. Complete this exercise __________ times a day. Wall slides This exercise strengthens the muscles in front of your thigh and knee (quadriceps). 1. Lean your back against  a smooth wall or door, and walk your feet out 18-24 inches (46-61 cm) from it. 2. Place your feet hip-width apart. 3. Slowly slide down the wall or door until your knees bend __________ degrees. Keep your knees over your heels, not over your toes. Keep your knees in line with your hips. 4. Hold this position for __________ seconds. Repeat __________ times. Complete this exercise __________ times a day. Straight leg raises This exercise strengthens the muscles that rotate the leg at the hip and move it away from your body (hip abductors). 1. Lie on your side with your left / right leg in the top position. Lie so your head, shoulder, knee, and hip line up. You may bend your bottom knee to help you keep your balance. 2. Roll your hips slightly forward so your hips are stacked directly over each other and your left / right knee is facing forward. 3. Leading with your heel, lift your top leg 4-6 inches (10-15 cm). You should feel the muscles in your outer hip lifting. ? Do not let your foot drift forward. ? Do not let your knee roll toward the ceiling. 4. Hold this position for __________ seconds. 5. Slowly return your leg to the starting position. 6. Let your muscles relax completely after each repetition. Repeat __________ times. Complete this exercise __________ times a day. Straight leg raises This exercise stretches the muscles that move your hips away from the front of the pelvis (hip extensors). 1. Lie on your abdomen on a firm surface. You can put a pillow under your hips if that is more comfortable. 2. Tense the muscles in your buttocks and lift your left / right leg about 4-6 inches (10-15 cm). Keep your knee straight as you lift your leg. 3. Hold this position for __________ seconds. 4. Slowly lower your leg to the starting position. 5. Let your leg relax completely after each repetition. Repeat __________ times. Complete this exercise __________ times a day. This information is not  intended to replace advice given to you by your health care provider. Make sure you discuss any questions you have with your health care provider. Document Revised: 12/18/2017 Document Reviewed: 12/18/2017 Elsevier Patient Education  2020 [employer].

## 2019-11-10 NOTE — Telephone Encounter (Signed)
Please start benefits investigation for Humira for treatment of rheumaotid arthritis.  Patient has tried Plaquenil monotherapy, Plaquenil with oral and injectable methotrexate with inadequate response.  She can not take maximum dose of methotrexate due to elevated LFT's.   Mariella Saa, PharmD, [place], CPP Clinical Specialty Pharmacist (Rheumatology and Pulmonology)  11/10/2019 11:19 AM

## 2019-11-10 NOTE — Progress Notes (Signed)
Pharmacy Note Subjective: Patient presents today to [facility] for follow up office visit. Patient seen by the pharmacist for counseling on Humira for rheumatoid arthritis.  Prior therapy includes:Plaquenil, oral and injectable MTX.  Objective:  CBC    Component Value Date/Time   WBC 6.9 09/03/2019 1148   RBC 4.23 09/03/2019 1148   HGB 11.7 09/03/2019 1148   HGB 11.7 (L) 11/28/2018 0858   HCT 35.3 09/03/2019 1148   PLT 259 09/03/2019 1148   PLT 281 11/28/2018 0858   MCV 83.5 09/03/2019 1148   MCH 27.7 09/03/2019 1148   MCHC 33.1 09/03/2019 1148   RDW 13.1 09/03/2019 1148   LYMPHSABS 773 (L) 09/03/2019 1148   MONOABS 0.9 08/05/2019 0010   EOSABS 338 09/03/2019 1148   BASOSABS 62 09/03/2019 1148     CMP     Component Value Date/Time   NA 140 09/03/2019 1148   K 3.7 09/03/2019 1148   CL 104 09/03/2019 1148   CO2 25 09/03/2019 1148   GLUCOSE 103 (H) 09/03/2019 1148   BUN 13 09/03/2019 1148   CREATININE 0.77 09/03/2019 1148   CALCIUM 9.5 09/03/2019 1148   PROT 6.9 09/03/2019 1148   ALBUMIN 4.1 07/08/2019 1234   AST 18 09/03/2019 1148   AST 20 11/28/2018 0858   ALT 22 09/03/2019 1148   ALT 26 11/28/2018 0858   ALKPHOS 74 07/08/2019 1234   BILITOT 0.5 09/03/2019 1148   BILITOT 0.3 11/28/2018 0858   GFRNONAA 81 09/03/2019 1148   GFRAA 94 09/03/2019 1148      Baseline Immunosuppressant Therapy Labs TB GOLD Quantiferon TB Gold Latest Ref Rng & Units 04/26/2018  Quantiferon TB Gold Plus NEGATIVE NEGATIVE   Hepatitis Panel Hepatitis Latest Ref Rng & Units 04/26/2018  Hep B Surface Ag NON-REACTI NON-REACTIVE  Hep B IgM NON-REACTI NON-REACTIVE  Hep C Ab NEGATIVE -  Hep C Ab NON-REACTI NON-REACTIVE  Hep C Ab NON-REACTI NON-REACTIVE   HIV Lab Results  Component Value Date   HIV Non Reactive 08/05/2019   HIV NON-REACTIVE 04/26/2018   HIV NON-REACTIVE 08/22/2017   Immunoglobulins Immunoglobulin Electrophoresis Latest Ref Rng & Units 11/28/2018  IgA   70 - 320 mg/dL -  IgG 586 - 1,602 mg/dL 1,345  IgM 26 - 217 mg/dL 73   SPEP Serum Protein Electrophoresis Latest Ref Rng & Units 09/03/2019  Total Protein 6.1 - 8.1 g/dL 6.9  Albumin 3.8 - 4.8 g/dL -  Alpha-1 0.2 - 0.3 g/dL -  Alpha-2 0.5 - 0.9 g/dL -  Beta Globulin 0.4 - 0.6 g/dL -  Beta 2 0.2 - 0.5 g/dL -  Gamma Globulin 0.8 - 1.7 g/dL -  Interpretation - -   G6PD No results found for: G6PDH TPMT No results found for: TPMT   Chest x-ray: Minimal bronchitic changes and lingular scarring. No acute abnormalities. 09/23/2015  Does patient have diagnosis of heart failure?  No  Assessment/Plan:  Counseled patient that Humira is a TNF blocking agent.  Counseled patient on purpose, proper use, and adverse effects of Humira.  Reviewed the most common adverse effects including infections, headache, and injection site reactions. Discussed that there is the possibility of an increased risk of malignancy but it is not well understood if this increased risk is due to the medication or the disease state.  Advised patient to get yearly dermatology exams due to risk of skin cancer. Counseled patient that Humira should be held prior to scheduled surgery.  Counseled patient to avoid live vaccines  while on Humira.  Advised patient to get annual influenza vaccine and the pneumococcal vaccine as indicated.    Reviewed the importance of regular labs while on Humira therapy.  Standing orders placed.  Provided patient with medication education material and answered all questions.  Patient consented to Humira.  Will upload consent into the media tab.  Reviewed storage instructions of Humira.  Advised initial injection must be administered in office.  Patient verbalized understanding.  Dose will be for rheumatoid arthritis Humira 40 mg every 14 days.  Prescription pending lab results and/or insurance approval.   Mariella Saa, PharmD, BCACP, CPP Clinical Specialty Pharmacist (Rheumatology and  Pulmonology)  11/10/2019 11:14 AM

## 2019-11-11 NOTE — Telephone Encounter (Signed)
Received notification from [facility] regarding a prior authorization for [place]. Authorization has been APPROVED from 11/10/19 to 03/12/20.   Authorization # [id number]  Ran test claim, 1 month supply is $1,859.03

## 2019-11-11 NOTE — Progress Notes (Signed)
Glucose is 106. Rest of CMP WNL.  CBC WNL

## 2019-11-12 LAB — CBC WITH DIFFERENTIAL/PLATELET
Absolute Monocytes: 647 cells/uL (ref 200–950)
Basophils Absolute: 73 cells/uL (ref 0–200)
Basophils Relative: 1.1 %
Eosinophils Absolute: 191 cells/uL (ref 15–500)
Eosinophils Relative: 2.9 %
HCT: 37.4 % (ref 35.0–45.0)
Hemoglobin: 12.7 g/dL (ref 11.7–15.5)
Lymphs Abs: 1076 cells/uL (ref 850–3900)
MCH: 28.5 pg (ref 27.0–33.0)
MCHC: 34 g/dL (ref 32.0–36.0)
MCV: 83.9 fL (ref 80.0–100.0)
MPV: 11.3 fL (ref 7.5–12.5)
Monocytes Relative: 9.8 %
Neutro Abs: 4613 cells/uL (ref 1500–7800)
Neutrophils Relative %: 69.9 %
Platelets: 253 10*3/uL (ref 140–400)
RBC: 4.46 10*6/uL (ref 3.80–5.10)
RDW: 13 % (ref 11.0–15.0)
Total Lymphocyte: 16.3 %
WBC: 6.6 10*3/uL (ref 3.8–10.8)

## 2019-11-12 LAB — COMPLETE METABOLIC PANEL WITH GFR
AG Ratio: 1.7 (calc) (ref 1.0–2.5)
ALT: 18 U/L (ref 6–29)
AST: 19 U/L (ref 10–35)
Albumin: 4.5 g/dL (ref 3.6–5.1)
Alkaline phosphatase (APISO): 61 U/L (ref 37–153)
BUN: 12 mg/dL (ref 7–25)
CO2: 27 mmol/L (ref 20–32)
Calcium: 10.1 mg/dL (ref 8.6–10.4)
Chloride: 107 mmol/L (ref 98–110)
Creat: 0.8 mg/dL (ref 0.50–0.99)
GFR, Est African American: 90 mL/min/{1.73_m2} (ref >=60)
GFR, Est Non African American: 77 mL/min/{1.73_m2} (ref >=60)
Globulin: 2.7 g/dL (calc) (ref 1.9–3.7)
Glucose, Bld: 106 mg/dL — ABNORMAL HIGH (ref 65–99)
Potassium: 3.8 mmol/L (ref 3.5–5.3)
Sodium: 143 mmol/L (ref 135–146)
Total Bilirubin: 0.4 mg/dL (ref 0.2–1.2)
Total Protein: 7.2 g/dL (ref 6.1–8.1)

## 2019-11-12 LAB — QUANTIFERON-TB GOLD PLUS
Mitogen-NIL: >10 IU/mL
NIL: 0.02 IU/mL
QuantiFERON-TB Gold Plus: NEGATIVE
TB1-NIL: 0 IU/mL
TB2-NIL: 0 IU/mL

## 2019-11-12 NOTE — Progress Notes (Signed)
TB gold negative

## 2019-11-15 DIAGNOSIS — R69 Illness, unspecified: Secondary | ICD-10-CM | POA: Diagnosis not present

## 2019-11-18 ENCOUNTER — Telehealth: Payer: Self-pay | Admitting: Rheumatology

## 2019-11-18 DIAGNOSIS — M0579 Rheumatoid arthritis with rheumatoid factor of multiple sites without organ or systems involvement: Secondary | ICD-10-CM

## 2019-11-18 MED ORDER — HYDROXYCHLOROQUINE SULFATE 200 MG PO TABS: 200.0000 mg | ORAL_TABLET | Freq: Two times a day (BID) | ORAL | 0 refills | Status: DC

## 2019-11-18 NOTE — Telephone Encounter (Signed)
Prescription was sent on 11/06/2019. Resent prescription.   Application placed up front for patient to pick up. Patient advised.

## 2019-11-18 NOTE — Telephone Encounter (Signed)
Patient called stating the pharmacist at [facility] told her they didn't receive a prescription of Plaquenil and to contact her doctor's office.    Patient also stated that she lost the application that she was suppose to fill out for her new medication and is requesting to pick up a new application.

## 2019-11-18 NOTE — Telephone Encounter (Signed)
Patient advised her prescription has been sent to [facility] [address]. Patient will contact the pharmacy. Patient advised Humira was approved, but we are awaiting patient to return her [employer] paperwork for PAP. Patient's copay with insurance is over $1800 for 1 month supply. Patient received application at office visit. Patient will look for her paperwork and complete and return to the office.

## 2019-11-18 NOTE — Telephone Encounter (Signed)
#  1.Patient has not heard anything about new medication doctor wants to prescribe. Patient was checking status of that. #2. Patient also requesting a refill on generic Plaquenil sent to [facility] on [address]  Please call to advise.

## 2019-11-18 NOTE — Telephone Encounter (Signed)
Called patient, had to leave message. Humira was approved, but we are awaiting patient to return her Abb[employer] paperwork for PAP. Patient's copay with insurance is over $1800 for 1 month supply. Patient received application at office visit.

## 2019-11-19 ENCOUNTER — Other Ambulatory Visit: Payer: Self-pay | Admitting: Internal Medicine

## 2019-11-19 NOTE — Telephone Encounter (Signed)
Done erx 

## 2019-11-20 ENCOUNTER — Inpatient Hospital Stay: Payer: Medicare HMO | Attending: Internal Medicine

## 2019-11-20 DIAGNOSIS — D472 Monoclonal gammopathy: Secondary | ICD-10-CM | POA: Insufficient documentation

## 2019-11-25 NOTE — Telephone Encounter (Signed)
Called patient to follow-up on Humira application.  Patient states she will drop off the documents today.  Will fax for approval once documents received.   Mariella Saa, PharmD, [place], CPP Clinical Specialty Pharmacist (Rheumatology and Pulmonology)  11/25/2019 9:15 AM

## 2019-11-25 NOTE — Telephone Encounter (Signed)
Patient dropped off application along with income documents.  Pending provider signature.  Mariella Saa, PharmD, [place], CPP Clinical Specialty Pharmacist (Rheumatology and Pulmonology)  11/25/2019 11:25 AM

## 2019-11-26 NOTE — Telephone Encounter (Signed)
Received signed provider portion.  Application faxed.  Will update when we receive a response.  Fax # [phone]  Mariella Saa, PharmD, BCACP, CPP Clinical Specialty Pharmacist (Rheumatology and Pulmonology)  11/26/2019 8:50 AM

## 2019-11-27 ENCOUNTER — Telehealth: Payer: Self-pay | Admitting: Internal Medicine

## 2019-11-27 ENCOUNTER — Inpatient Hospital Stay: Payer: Medicare HMO | Admitting: Internal Medicine

## 2019-11-27 NOTE — Telephone Encounter (Signed)
Scheduled appt per 9/16 sch msg - left message for patient with apt date and time

## 2019-11-28 NOTE — Telephone Encounter (Signed)
Patient approved for assistance.  Please schedule new start visit.  Patient should expect visit to be 1 hour.

## 2019-11-28 NOTE — Telephone Encounter (Signed)
I LMOM for patient to call to schedule office visit with our Pharmacist. New medication has been approved.

## 2019-11-28 NOTE — Telephone Encounter (Signed)
Patient is scheduled for 12/01/2019.

## 2019-11-28 NOTE — Telephone Encounter (Signed)
Received approval for Patient Assistance from [facility] for Humira. Patient has been Approved for assistance through 03/12/2021.  Will send document to scan center.  Phone#[phone]

## 2019-12-01 ENCOUNTER — Ambulatory Visit (INDEPENDENT_AMBULATORY_CARE_PROVIDER_SITE_OTHER): Payer: Medicare HMO | Admitting: Pharmacist

## 2019-12-01 ENCOUNTER — Other Ambulatory Visit: Payer: Self-pay | Admitting: Rheumatology

## 2019-12-01 ENCOUNTER — Other Ambulatory Visit: Payer: Self-pay

## 2019-12-01 VITALS — BP 148/77 | HR 77

## 2019-12-01 DIAGNOSIS — M0579 Rheumatoid arthritis with rheumatoid factor of multiple sites without organ or systems involvement: Secondary | ICD-10-CM

## 2019-12-01 MED ORDER — HUMIRA (2 PEN) 40 MG/0.4ML ~~LOC~~ AJKT: 40.0000 mg | AUTO-INJECTOR | SUBCUTANEOUS | 0 refills | Status: DC

## 2019-12-01 NOTE — Telephone Encounter (Signed)
Last Visit: 11/10/2019 Next Visit: 01/13/2020 Labs: 11/10/2019 Glucose is 106. Rest of CMP WNL. CBC WNL.  TB Gold: 11/10/2019 Negative  Current Dose per office note 11/10/2019: Methotrexate 0.5 mL subcu injections once weekly DX: Rheumatoid arthritis involving multiple sites with positive rheumatoid factor   Okay to refill Methotrexate?

## 2019-12-01 NOTE — Patient Instructions (Addendum)
   START Humira 40 mg every 14 days. (next injection will be on 10/4)  CONTINUE MTX 0.5 ml every 7 days  STOP Plaquenil  CALL [phone] to schedule first shipment  Remember the 5 C's:  COUNTER- leave on the counter at least 30 mins but up to overnight to bring medication to room temperature and prevent stinging  COLD- Placing something cold (like and ice gel pack or cold water bottle) on the injection site just before cleansing with alcohol may help reduce pain  CLARITIN- for the first two weeks of treatment or  the day of, the day before, and the day after injecting to minimize injection site reactions  CORTISONE CREAM- apply if injection site is irritated and itching  CALL ME- if injection site reaction is bigger than the size of your fist, looks infected, blisters, or develop hives  Standing Labs We placed an order today for your standing lab work.   Please have your standing labs drawn in 1 month and then every 3 months.  If possible, please have your labs drawn 2 weeks prior to your appointment so that the provider can discuss your results at your appointment.  We have open lab daily Monday through Thursday from 8:30-12:30 PM and 1:30-4:30 PM and Friday from 8:30-12:30 PM and 1:30-4:00 PM at the office of Dr. Bo Merino, [facility].   Please be advised, patients with office appointments requiring lab work will take precedents over walk-in lab work.  If possible, please come for your lab work on Monday and Friday afternoons, as you may experience shorter wait times. The office is located at [address] [place], [place], [place] [address] No appointment is necessary.   Labs are drawn by [facility]. Please bring your co-pay at the time of your lab draw.  You may receive a bill from [facility] for your lab work.  If you wish to have your labs drawn at another location, please call the office 24 hours in advance to send orders.  If you have any questions  regarding directions or hours of operation,  please call [phone].   As a reminder, please drink plenty of water prior to coming for your lab work. Thanks!

## 2019-12-01 NOTE — Progress Notes (Signed)
Pharmacy Note  Subjective:   Patient presents to clinic today to receive first dose of Humira.  Patient running a fever or have signs/symptoms of infection? No  Patient currently on antibiotics for the treatment of infection? No  Patient have any upcoming invasive procedures/surgeries? No  Objective: CMP     Component Value Date/Time   NA 143 11/10/2019 1130   K 3.8 11/10/2019 1130   CL 107 11/10/2019 1130   CO2 27 11/10/2019 1130   GLUCOSE 106 (H) 11/10/2019 1130   BUN 12 11/10/2019 1130   CREATININE 0.80 11/10/2019 1130   CALCIUM 10.1 11/10/2019 1130   PROT 7.2 11/10/2019 1130   ALBUMIN 4.1 07/08/2019 1234   AST 19 11/10/2019 1130   AST 20 11/28/2018 0858   ALT 18 11/10/2019 1130   ALT 26 11/28/2018 0858   ALKPHOS 74 07/08/2019 1234   BILITOT 0.4 11/10/2019 1130   BILITOT 0.3 11/28/2018 0858   GFRNONAA 77 11/10/2019 1130   GFRAA 90 11/10/2019 1130    CBC    Component Value Date/Time   WBC 6.6 11/10/2019 1130   RBC 4.46 11/10/2019 1130   HGB 12.7 11/10/2019 1130   HGB 11.7 (L) 11/28/2018 0858   HCT 37.4 11/10/2019 1130   PLT 253 11/10/2019 1130   PLT 281 11/28/2018 0858   MCV 83.9 11/10/2019 1130   MCH 28.5 11/10/2019 1130   MCHC 34.0 11/10/2019 1130   RDW 13.0 11/10/2019 1130   LYMPHSABS 1,076 11/10/2019 1130   MONOABS 0.9 08/05/2019 0010   EOSABS 191 11/10/2019 1130   BASOSABS 73 11/10/2019 1130    Baseline Immunosuppressant Therapy Labs TB GOLD Quantiferon TB Gold Latest Ref Rng & Units 11/10/2019  Quantiferon TB Gold Plus NEGATIVE NEGATIVE   Hepatitis Panel Hepatitis Latest Ref Rng & Units 04/26/2018  Hep B Surface Ag NON-REACTI NON-REACTIVE  Hep B IgM NON-REACTI NON-REACTIVE  Hep C Ab NEGATIVE -  Hep C Ab NON-REACTI NON-REACTIVE  Hep C Ab NON-REACTI NON-REACTIVE   HIV Lab Results  Component Value Date   HIV Non Reactive 08/05/2019   HIV NON-REACTIVE 04/26/2018   HIV NON-REACTIVE 08/22/2017   Immunoglobulins Immunoglobulin  Electrophoresis Latest Ref Rng & Units 11/28/2018  IgA  70 - 320 mg/dL -  IgG 586 - 1,602 mg/dL 1,345  IgM 26 - 217 mg/dL 73   SPEP Serum Protein Electrophoresis Latest Ref Rng & Units 11/10/2019  Total Protein 6.1 - 8.1 g/dL 7.2  Albumin 3.8 - 4.8 g/dL -  Alpha-1 0.2 - 0.3 g/dL -  Alpha-2 0.5 - 0.9 g/dL -  Beta Globulin 0.4 - 0.6 g/dL -  Beta 2 0.2 - 0.5 g/dL -  Gamma Globulin 0.8 - 1.7 g/dL -  Interpretation - -   G6PD No results found for: G6PDH TPMT No results found for: TPMT   Chest x-ray: Minimal bronchitic changes and lingular scarring. No acute abnormalities.09/23/2015  Assessment/Plan:  Demonstrated proper injection technique with Humira demo pen.  Patient able to demonstrate proper injection technique using the teach back method. Patient self injected in the right upper thigh with:  Sample Medication: Humira 40 mg/0.42ml NDC: 6967-8938-10 Lot: [id number] Expiration: 08/2020  Patient tolerated well.  Observed for 30 mins in office for adverse reaction and none noted.   Patient enrolled in [facility] Assist program and has not schedule first shipment.  Patient given number to call to schedule.  Patient is to return in 1 month for labs and 6-8 weeks for follow-up appointment.  Standing orders placed.  All questions encouraged and answered.  Instructed patient to call with any further questions or concerns.  Mariella Saa, PharmD, [facility] Clinical Pharmacist  12/01/2019 8:30 AM

## 2019-12-04 ENCOUNTER — Ambulatory Visit: Payer: Medicare HMO | Admitting: Physician Assistant

## 2019-12-09 ENCOUNTER — Inpatient Hospital Stay: Payer: Medicare HMO

## 2019-12-09 ENCOUNTER — Other Ambulatory Visit: Payer: Self-pay

## 2019-12-09 DIAGNOSIS — D472 Monoclonal gammopathy: Secondary | ICD-10-CM

## 2019-12-09 LAB — CMP (CANCER CENTER ONLY)
ALT: 21 U/L (ref 0–44)
AST: 23 U/L (ref 15–41)
Albumin: 4 g/dL (ref 3.5–5.0)
Alkaline Phosphatase: 72 U/L (ref 38–126)
Anion gap: 5 (ref 5–15)
BUN: 12 mg/dL (ref 8–23)
CO2: 29 mmol/L (ref 22–32)
Calcium: 9.9 mg/dL (ref 8.9–10.3)
Chloride: 103 mmol/L (ref 98–111)
Creatinine: 0.89 mg/dL (ref 0.44–1.00)
GFR, Est AFR Am: >60 mL/min (ref >60)
GFR, Estimated: >60 mL/min (ref >60)
Glucose, Bld: 104 mg/dL — ABNORMAL HIGH (ref 70–99)
Potassium: 3.3 mmol/L — ABNORMAL LOW (ref 3.5–5.1)
Sodium: 137 mmol/L (ref 135–145)
Total Bilirubin: 0.5 mg/dL (ref 0.3–1.2)
Total Protein: 7.8 g/dL (ref 6.5–8.1)

## 2019-12-09 LAB — CBC WITH DIFFERENTIAL (CANCER CENTER ONLY)
Abs Immature Granulocytes: 0.02 10*3/uL (ref 0.00–0.07)
Basophils Absolute: 0.1 10*3/uL (ref 0.0–0.1)
Basophils Relative: 1 %
Eosinophils Absolute: 0.1 10*3/uL (ref 0.0–0.5)
Eosinophils Relative: 1 %
HCT: 36.9 % (ref 36.0–46.0)
Hemoglobin: 12.6 g/dL (ref 12.0–15.0)
Immature Granulocytes: 0 %
Lymphocytes Relative: 16 %
Lymphs Abs: 1.3 10*3/uL (ref 0.7–4.0)
MCH: 27.6 pg (ref 26.0–34.0)
MCHC: 34.1 g/dL (ref 30.0–36.0)
MCV: 80.7 fL (ref 80.0–100.0)
Monocytes Absolute: 0.8 10*3/uL (ref 0.1–1.0)
Monocytes Relative: 10 %
Neutro Abs: 5.8 10*3/uL (ref 1.7–7.7)
Neutrophils Relative %: 72 %
Platelet Count: 250 10*3/uL (ref 150–400)
RBC: 4.57 MIL/uL (ref 3.87–5.11)
RDW: 12.4 % (ref 11.5–15.5)
WBC Count: 8.1 10*3/uL (ref 4.0–10.5)
nRBC: 0 % (ref 0.0–0.2)

## 2019-12-09 LAB — LACTATE DEHYDROGENASE: LDH: 232 U/L — ABNORMAL HIGH (ref 98–192)

## 2019-12-10 ENCOUNTER — Ambulatory Visit: Payer: Medicare HMO | Admitting: Physician Assistant

## 2019-12-10 ENCOUNTER — Telehealth: Payer: Self-pay | Admitting: Gastroenterology

## 2019-12-10 LAB — BETA 2 MICROGLOBULIN, SERUM: Beta-2 Microglobulin: 1.3 mg/L (ref 0.6–2.4)

## 2019-12-10 LAB — KAPPA/LAMBDA LIGHT CHAINS
Kappa free light chain: 16.2 mg/L (ref 3.3–19.4)
Kappa, lambda light chain ratio: 0.93 (ref 0.26–1.65)
Lambda free light chains: 17.5 mg/L (ref 5.7–26.3)

## 2019-12-10 LAB — IGG, IGA, IGM
IgA: 348 mg/dL (ref 87–352)
IgG (Immunoglobin G), Serum: 1305 mg/dL (ref 586–1602)
IgM (Immunoglobulin M), Srm: 86 mg/dL (ref 26–217)

## 2019-12-10 MED ORDER — PANTOPRAZOLE SODIUM 40 MG PO TBEC: 40.0000 mg | DELAYED_RELEASE_TABLET | Freq: Every day | ORAL | 1 refills | Status: DC

## 2019-12-10 NOTE — Telephone Encounter (Signed)
Rx sent to pharmacy as requested.

## 2019-12-15 ENCOUNTER — Other Ambulatory Visit: Payer: Self-pay | Admitting: Internal Medicine

## 2019-12-15 NOTE — Telephone Encounter (Signed)
Done erx 

## 2019-12-16 ENCOUNTER — Other Ambulatory Visit: Payer: Self-pay

## 2019-12-16 ENCOUNTER — Encounter: Payer: Self-pay | Admitting: Internal Medicine

## 2019-12-16 ENCOUNTER — Inpatient Hospital Stay: Payer: Medicare HMO | Attending: Internal Medicine | Admitting: Internal Medicine

## 2019-12-16 VITALS — BP 125/75 | HR 79 | Temp 97.6°F | Resp 19 | Ht 69.0 in | Wt 241.4 lb

## 2019-12-16 DIAGNOSIS — D472 Monoclonal gammopathy: Secondary | ICD-10-CM | POA: Diagnosis not present

## 2019-12-16 DIAGNOSIS — F418 Other specified anxiety disorders: Secondary | ICD-10-CM | POA: Diagnosis not present

## 2019-12-16 DIAGNOSIS — K219 Gastro-esophageal reflux disease without esophagitis: Secondary | ICD-10-CM | POA: Insufficient documentation

## 2019-12-16 DIAGNOSIS — M129 Arthropathy, unspecified: Secondary | ICD-10-CM | POA: Diagnosis not present

## 2019-12-16 DIAGNOSIS — G4700 Insomnia, unspecified: Secondary | ICD-10-CM | POA: Diagnosis not present

## 2019-12-16 DIAGNOSIS — Z87442 Personal history of urinary calculi: Secondary | ICD-10-CM | POA: Diagnosis not present

## 2019-12-16 DIAGNOSIS — E785 Hyperlipidemia, unspecified: Secondary | ICD-10-CM | POA: Diagnosis not present

## 2019-12-16 DIAGNOSIS — Z79899 Other long term (current) drug therapy: Secondary | ICD-10-CM | POA: Diagnosis not present

## 2019-12-16 DIAGNOSIS — R69 Illness, unspecified: Secondary | ICD-10-CM | POA: Diagnosis not present

## 2019-12-16 DIAGNOSIS — I10 Essential (primary) hypertension: Secondary | ICD-10-CM

## 2019-12-16 NOTE — Progress Notes (Signed)
[facility] [phone]   [phone]  OFFICE PROGRESS NOTE  Biagio Borg, MD [place] [address]  DIAGNOSIS: Monoclonal gammopathy of undetermined significance  PRIOR THERAPY: None  CURRENT THERAPY: Observation.  INTERVAL HISTORY: Pamela [name] 64 y.o. female returns to the clinic today for follow-up visit.  The patient is feeling fine today with no concerning complaints.  She denied having any chest pain, shortness of breath, cough or hemoptysis.  She denied having any fever or chills.  She has no nausea, vomiting, diarrhea or constipation.  She has no significant weight loss or night sweats.  She is here today for evaluation with repeat myeloma panel.  MEDICAL HISTORY: Past Medical History:  Diagnosis Date  . ALLERGIC RHINITIS 12/18/2006   Qualifier: Diagnosis of  By: [place], [place]    . Anxiety   . Arthritis   . DEPRESSION, SITUATIONAL 03/15/2007   Qualifier: Diagnosis of  By: Wynona Luna   . GERD (gastroesophageal reflux disease)   . Headache    occasional  . History of kidney stones   . HYPERLIPIDEMIA 12/18/2006   Qualifier: History of  By: Danny Lawless CMA, [place]    . Hypertension   . HYPERTENSION 12/18/2006   Qualifier: Diagnosis of  By: Danny Lawless CMA, [place]    . INSOMNIA-SLEEP DISORDER-UNSPEC 08/25/2009   Qualifier: Diagnosis of  By: Jenny Reichmann MD, [place]   . Pain in knee region after total knee replacement ([place]) 04/03/2012  . Pneumonia   . RENAL CALCULUS, HX OF 12/18/2006   Qualifier: Diagnosis of  By: Danny Lawless CMA, [place]      ALLERGIES:  is allergic to cymbalta [duloxetine hcl].  MEDICATIONS:  Current Outpatient Medications  Medication Sig Dispense Refill  . Adalimumab (HUMIRA PEN) 40 MG/0.4ML PNKT Inject 40 mg into the skin every 14 (fourteen) days. 3 each 0  . ALPRAZolam (XANAX) 0.25 MG tablet TAKE 1 TABLET BY MOUTH TWICE DAILY AS NEEDED 60 tablet 2  . atorvastatin (LIPITOR) 20 MG tablet TAKE 1  TABLET BY MOUTH DAILY 90 tablet 0  . B-D TB SYRINGE 1CC/27GX1/2" 27G X 1/2" 1 ML MISC USE ONCE WEEKLY    . folic acid (FOLVITE) 1 MG tablet Take 2 tablets (2 mg total) by mouth daily. 180 tablet 3  . gabapentin (NEURONTIN) 300 MG capsule Take 1 capsule (300 mg total) by mouth 2 (two) times daily. 180 capsule 1  . hydrochlorothiazide (MICROZIDE) 12.5 MG capsule TAKE 1 CAPSULE(12.5 MG) BY MOUTH DAILY 90 capsule 3  . hydroxychloroquine (PLAQUENIL) 200 MG tablet Take 1 tablet (200 mg total) by mouth 2 (two) times daily. 180 tablet 0  . Insulin Syringe-Needle U-100 28G X 5/16" 1 ML MISC Use 1 syringe every 7 days to inject methotrexate. 12 each 3  . losartan (COZAAR) 100 MG tablet Take 1 tablet (100 mg total) by mouth daily. 90 tablet 3  . methotrexate 50 MG/2ML injection INJECT 0.5 MLS INTO THE SKIN ONCE A WEEK. 6 mL 1  . metoCLOPramide (REGLAN) 10 MG tablet TAKE 1 TABLET BY MOUTH TWICE DAILY MORNING AND BEDTIME 60 tablet 0  . Multiple Vitamin (MULTIVITAMIN WITH MINERALS) TABS tablet Take 1 tablet by mouth daily.    . pantoprazole (PROTONIX) 40 MG tablet Take 1 tablet (40 mg total) by mouth daily. TAKE 1 TABLET(40 MG) BY MOUTH DAILY 30 tablet 1  . sertraline (ZOLOFT) 100 MG tablet Take 1 tablet (100 mg total) by mouth daily.  90 tablet 3  . traMADol (ULTRAM) 50 MG tablet TAKE 1 TABLET BY MOUTH EVERY 6 HOURS AS NEEDED FOR PAIN (Patient taking differently: Take 50 mg by mouth every 6 (six) hours as needed for moderate pain. ) 120 tablet 2  . XALATAN 0.005 % ophthalmic solution Place 1 drop into the right eye at bedtime.     Marland Kitchen zolpidem (AMBIEN) 10 MG tablet TAKE 1 TABLET BY MOUTH AT BEDTIME FOR SLEEP. 90 tablet 1   No current facility-administered medications for this visit.    SURGICAL HISTORY:  Past Surgical History:  Procedure Laterality Date  . CERVICAL DISC SURGERY     x 3  . CONVERSION TO TOTAL KNEE Left 10/09/2016   Procedure: Conversion left uni compartment arthroplasty to total knee  arthroplasty;  Surgeon: Paralee Cancel, MD;  Location: [facility];  Service: Orthopedics;  Laterality: Left;  90 mins  . KNEE ARTHROSCOPY     partial  . total left knee arthoplasty     10/09/16 Dr. Alvan Dame    REVIEW OF SYSTEMS:  A comprehensive review of systems was negative.   PHYSICAL EXAMINATION: General appearance: alert, cooperative and no distress Head: Normocephalic, without obvious abnormality, atraumatic Neck: no adenopathy, no JVD, supple, symmetrical, trachea midline and thyroid not enlarged, symmetric, no tenderness/mass/nodules Lymph nodes: Cervical, supraclavicular, and axillary nodes normal. Resp: clear to auscultation bilaterally Back: symmetric, no curvature. ROM normal. No CVA tenderness. Cardio: regular rate and rhythm, S1, S2 normal, no murmur, click, rub or gallop GI: soft, non-tender; bowel sounds normal; no masses,  no organomegaly Extremities: extremities normal, atraumatic, no cyanosis or edema  ECOG PERFORMANCE STATUS: 0 - Asymptomatic  Blood pressure 125/75, pulse 79, temperature 97.6 F (36.4 C), temperature source Tympanic, resp. rate 19, height 5\' 9"  (1.753 m), weight 241 lb 6.4 oz (109.5 kg), SpO2 100 %.  LABORATORY DATA: Lab Results  Component Value Date   WBC 8.1 12/09/2019   HGB 12.6 12/09/2019   HCT 36.9 12/09/2019   MCV 80.7 12/09/2019   PLT 250 12/09/2019      Chemistry      Component Value Date/Time   NA 137 12/09/2019 1401   K 3.3 (L) 12/09/2019 1401   CL 103 12/09/2019 1401   CO2 29 12/09/2019 1401   BUN 12 12/09/2019 1401   CREATININE 0.89 12/09/2019 1401   CREATININE 0.80 11/10/2019 1130      Component Value Date/Time   CALCIUM 9.9 12/09/2019 1401   ALKPHOS 72 12/09/2019 1401   AST 23 12/09/2019 1401   ALT 21 12/09/2019 1401   BILITOT 0.5 12/09/2019 1401       RADIOGRAPHIC STUDIES: No results found.  ASSESSMENT AND PLAN: This is a very pleasant 64 years old African-American female with very mild IgA monoclonal gammopathy. The  patient is feeling fine today with no concerning complaints.  She had repeat myeloma panel performed recently.  I discussed the lab results with the patient today.  Her labs showed no concerning findings for monoclonal gammopathy or any other abnormality. I recommended for the patient to continue on observation with follow-up visit with her primary care physician. I will see the patient on as-needed basis at this point. The patient voices understanding of current disease status and treatment options and is in agreement with the current care plan. All questions were answered. The patient knows to call the clinic with any problems, questions or concerns. We can certainly see the patient much sooner if necessary.  Disclaimer: This note was dictated  with voice recognition software. Similar sounding words can inadvertently be transcribed and may not be corrected upon review.

## 2019-12-19 ENCOUNTER — Other Ambulatory Visit: Payer: Self-pay | Admitting: Internal Medicine

## 2019-12-19 NOTE — Telephone Encounter (Signed)
Please change to OTC Vitamin D3 at 2000 units per day, indefinitely.  

## 2019-12-22 ENCOUNTER — Other Ambulatory Visit: Payer: Self-pay | Admitting: Gastroenterology

## 2019-12-22 ENCOUNTER — Other Ambulatory Visit: Payer: Self-pay | Admitting: Internal Medicine

## 2019-12-22 NOTE — Telephone Encounter (Signed)
Please change to OTC Vitamin D3 at 2000 units per day, indefinitely.  

## 2019-12-30 NOTE — Progress Notes (Signed)
Office Visit Note  Patient: Pamela [name]             Date of Birth: January 10, 1956           MRN: [id number]             PCP: Biagio Borg, MD Referring: Biagio Borg, MD Visit Date: 01/13/2020 Occupation: @GUAROCC @  Subjective:  Arthritis (Doing good)   History of Present Illness: Pamela [name] is a 63 y.o. female history of seropositive rheumatoid arthritis osteoarthritis and degenerative disc disease.  She states she is doing much better since she started taking Humira.  She stopped methotrexate about a month ago.  She discontinued Plaquenil when she started Humira.  She denies any joint swelling.  She states she does not like taking subcu methotrexate as it causes bruising.  She would like to switch to oral methotrexate.  She continues to have some discomfort in her shoulders, her left knee joint which is replaced in her lower back.  Activities of Daily Living:  Patient reports morning stiffness for 0 none.   Patient Denies nocturnal pain.  Difficulty dressing/grooming: Denies Difficulty climbing stairs: Reports Difficulty getting out of chair: Reports Difficulty using hands for taps, buttons, cutlery, and/or writing: Denies  Review of Systems  Constitutional: Positive for fatigue.  HENT: Negative for mouth dryness.   Eyes: Negative for dryness.  Respiratory: Negative for shortness of breath.   Cardiovascular: Negative for swelling in legs/feet.  Gastrointestinal: Negative for constipation.  Endocrine: Positive for cold intolerance and heat intolerance.  Genitourinary: Negative for difficulty urinating.  Musculoskeletal: Positive for arthralgias and joint pain. Negative for morning stiffness.  Skin: Negative for rash.  Allergic/Immunologic: Negative for susceptible to infections.  Neurological: Negative for numbness.  Hematological: Positive for bruising/bleeding tendency.  Psychiatric/Behavioral: Positive for sleep disturbance.    PMFS History:  Patient Active Problem  List   Diagnosis Date Noted  . Spinal stenosis of lumbar region 08/05/2019  . Inability to walk 08/05/2019  . Hives 07/08/2019  . History of glaucoma 04/24/2019  . Bursitis of left hip 12/25/2018  . MGUS (monoclonal gammopathy of unknown significance) 05/11/2018  . Rheumatoid arthritis involving multiple sites with positive rheumatoid factor ([place]) 05/03/2018  . High risk medication use 05/03/2018  . Status post total left knee replacement 05/03/2018  . DDD (degenerative disc disease), lumbar 05/03/2018  . Primary osteoarthritis of right knee 04/05/2018  . 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 Left rotator cuff tear 12/19/2017  . Left wrist pain 12/12/2017  . Left shoulder pain 08/22/2017  . Acute pain of right knee 04/17/2017  . Pain of left calf 04/13/2017  . 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 Hyperglycemia 09/23/2015  . Anxiety state 09/23/2015  . Abnormal urine odor 08/25/2015  . GERD without esophagitis 08/25/2015  . Upper abdominal pain 04/16/2015  . Early satiety 04/16/2015  . Dysuria 04/16/2015  . Encounter for well adult exam with abnormal findings 06/18/2014  . Greater trochanteric bursitis of right hip 01/06/2013  . Left lumbar radiculitis 04/03/2012  . Eczema 04/03/2012  . 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 BURSITIS, RIGHT HIP 08/25/2009  . INSOMNIA-SLEEP DISORDER-UNSPEC 08/25/2009  . FATIGUE 08/25/2009  . Cervicalgia 08/14/2007  . Depression 03/15/2007  . HLD (hyperlipidemia) 12/18/2006  . Essential hypertension 12/18/2006  . Allergic rhinitis 12/18/2006  . RENAL CALCULUS, HX OF 12/18/2006    Past Medical History:  Diagnosis Date  . ALLERGIC RHINITIS 12/18/2006   Qualifier: Diagnosis of  By: Danny Lawless  [place], [place]    . Anxiety   . Arthritis   . DEPRESSION, SITUATIONAL 03/15/2007   Qualifier: Diagnosis of  By: Wynona Luna   . GERD (gastroesophageal reflux disease)   . Headache    occasional  . History of kidney stones   . HYPERLIPIDEMIA 12/18/2006   Qualifier:  History of  By: Danny Lawless CMA, [place]    . Hypertension   . HYPERTENSION 12/18/2006   Qualifier: Diagnosis of  By: Danny Lawless CMA, [place]    . INSOMNIA-SLEEP DISORDER-UNSPEC 08/25/2009   Qualifier: Diagnosis of  By: Jenny Reichmann MD, [place]   . Pain in knee region after total knee replacement ([place]) 04/03/2012  . Pneumonia   . RENAL CALCULUS, HX OF 12/18/2006   Qualifier: Diagnosis of  By: Danny Lawless CMA, [place]      Family History  Problem Relation Age of Onset  . Stroke Mother   . Heart disease Father   . Lung cancer Sister   . Lung cancer Brother   . Healthy Daughter   . Healthy Daughter   . Healthy Daughter    Past Surgical History:  Procedure Laterality Date  . CERVICAL DISC SURGERY     x 3  . CONVERSION TO TOTAL KNEE Left 10/09/2016   Procedure: Conversion left uni compartment arthroplasty to total knee arthroplasty;  Surgeon: Paralee Cancel, MD;  Location: [facility];  Service: Orthopedics;  Laterality: Left;  90 mins  . KNEE ARTHROSCOPY     partial  . total left knee arthoplasty     10/09/16 Dr. Alvan Dame   Social History   Social History Narrative  . Not on file   Immunization History  Administered Date(s) Administered  . Influenza Whole 11/12/2012  . Influenza,inj,Quad PF,6+ Mos 12/11/2017, 12/25/2018  . Influenza-Unspecified 12/15/2013, 12/12/2015, 11/29/2016  . Td 11/03/2002  . Tdap 09/23/2015  . Zoster 08/12/2012  . Zoster Recombinat (Shingrix) 03/22/2018, 08/20/2018     Objective: Vital Signs: BP 127/84 (BP Location: Left Arm, Patient Position: Sitting, Cuff Size: Normal)   Pulse 72   Resp 16   Ht 5\' 9"  (1.753 m)   Wt 241 lb 6.4 oz (109.5 kg)   BMI 35.65 kg/m    Physical Exam Vitals and nursing note reviewed.  Constitutional:      Appearance: She is well-developed.  HENT:     Head: Normocephalic and atraumatic.  Eyes:     Conjunctiva/sclera: Conjunctivae normal.  Cardiovascular:     Rate and Rhythm: Normal rate and regular rhythm.     Heart sounds: Normal heart sounds.   Pulmonary:     Effort: Pulmonary effort is normal.     Breath sounds: Normal breath sounds.  Abdominal:     General: Bowel sounds are normal.     Palpations: Abdomen is soft.  Musculoskeletal:     Cervical back: Normal range of motion.  Lymphadenopathy:     Cervical: No cervical adenopathy.  Skin:    General: Skin is warm and dry.     Capillary Refill: Capillary refill takes less than 2 seconds.  Neurological:     Mental Status: She is alert and oriented to person, place, and time.  Psychiatric:        Behavior: Behavior normal.      Musculoskeletal Exam: C-spine was in good range of motion.  She had some discomfort range of motion of her lumbar spine.  She has some discomfort range of motion of her shoulder joints.  Elbow joints and wrist joints with good  range of motion.  She has some synovial thickening in her wrist joints.  There was no synovitis over MCPs or PIPs.  Hip joints with good range of motion.  Left knee joint is replaced.  She had no tenderness over ankles or MTPs.  CDAI Exam: CDAI Score: 1.2  Patient Global: 1 mm; Provider Global: 1 mm Swollen: 0 ; Tender: 1  Joint Exam 01/13/2020      Right  Left  Knee      Tender     Investigation: No additional findings.  Imaging: No results found.  Recent Labs: Lab Results  Component Value Date   WBC 8.1 12/09/2019   HGB 12.6 12/09/2019   PLT 250 12/09/2019   NA 137 12/09/2019   K 3.3 (L) 12/09/2019   CL 103 12/09/2019   CO2 29 12/09/2019   GLUCOSE 104 (H) 12/09/2019   BUN 12 12/09/2019   CREATININE 0.89 12/09/2019   BILITOT 0.5 12/09/2019   ALKPHOS 72 12/09/2019   AST 23 12/09/2019   ALT 21 12/09/2019   PROT 7.8 12/09/2019   ALBUMIN 4.0 12/09/2019   CALCIUM 9.9 12/09/2019   GFRAA >60 12/09/2019   QFTBGOLDPLUS NEGATIVE 11/10/2019    Speciality Comments: PLQ Eye Exam: 03/26/2019 WNL @ [facility] OD PA FOllow up in 1 year                                                               vvvvv  Procedures:  No procedures performed Allergies: Cymbalta [duloxetine hcl]   Assessment / Plan:     Visit Diagnoses: Rheumatoid arthritis involving multiple sites with positive rheumatoid factor (HCC) - ANA 1:160 Cytoplasmic,RF 116, sed rate 22: She is clinically doing much better on Humira.  She has been on Humira every other week.  She discontinued methotrexate as she gets bruising from the injections.  She is willing to try oral methotrexate.  I will call in a prescription for methotrexate 4 tablets p.o. weekly.  She has been advised to continue folic acid.  She discontinued Plaquenil when she started Humira.  High risk medication use - Humira-started 12/01/19, MTX 0.5 ml sq injections once weekly, folic acid 1 mg po daily, - Plan: CBC with Differential/Platelet, COMPLETE METABOLIC PANEL WITH GFR today and then every 3 months to monitor for drug toxicity.  Primary osteoarthritis of right knee-she is currently not having much discomfort.  S/P left TK revision-she continues to have discomfort in her left knee.  Traumatic incomplete tear of left rotator cuff, subsequent encounter-she has some discomfort with range of motion.  DDD (degenerative disc disease), lumbar-she is off-and-on discomfort.  Other medical problems are listed as follows:  Anxiety and depression  History of hyperlipidemia  Essential hypertension  History of glaucoma  Other proteinuria  History of gastroesophageal reflux (GERD)  Vitamin D deficiency - Plan: VITAMIN D 25 Hydroxy (Vit-D Deficiency, Fractures)  Osteoporosis screening-I will schedule DEXA scan.  Postmenopausal  Orders: Orders Placed This Encounter  Procedures  . DG BONE DENSITY (DXA)  . CBC with Differential/Platelet  . COMPLETE METABOLIC PANEL WITH GFR  . VITAMIN D 25 Hydroxy (Vit-D Deficiency, Fractures)   Meds ordered this encounter  Medications  . methotrexate (RHEUMATREX) 2.5 MG tablet    Sig: Take 4 tablets (10 mg total)  by  mouth once a week. Caution:Chemotherapy. Protect from light.    Dispense:  48 tablet    Refill:  0   .  Follow-Up Instructions: Return in about 3 months (around 04/14/2020) for Rheumatoid arthritis, Osteoarthritis.   Bo Merino, MD  Note - This record has been created using Editor, commissioning.  Chart creation errors have been sought, but may not always  have been located. Such creation errors do not reflect on  the standard of medical care.

## 2020-01-02 ENCOUNTER — Other Ambulatory Visit: Payer: Self-pay | Admitting: Internal Medicine

## 2020-01-02 ENCOUNTER — Other Ambulatory Visit: Payer: Self-pay | Admitting: Gastroenterology

## 2020-01-02 NOTE — Telephone Encounter (Signed)
Done erx 

## 2020-01-08 ENCOUNTER — Ambulatory Visit: Payer: Medicare HMO | Admitting: Physician Assistant

## 2020-01-08 ENCOUNTER — Ambulatory Visit: Payer: Medicare HMO | Admitting: Internal Medicine

## 2020-01-09 ENCOUNTER — Telehealth: Payer: Self-pay | Admitting: Internal Medicine

## 2020-01-09 NOTE — Telephone Encounter (Signed)
Patient is requesting a medication refill for traMADol (ULTRAM) 50 MG tablet It can be sent to [id number] - [place], [address]  Please call patient when rx is done: [phone]

## 2020-01-11 ENCOUNTER — Other Ambulatory Visit: Payer: Self-pay | Admitting: Internal Medicine

## 2020-01-11 NOTE — Telephone Encounter (Signed)
Please change to OTC Vitamin D3 at 2000 units per day, indefinitely.  

## 2020-01-13 ENCOUNTER — Ambulatory Visit (INDEPENDENT_AMBULATORY_CARE_PROVIDER_SITE_OTHER): Payer: Medicare HMO | Admitting: Rheumatology

## 2020-01-13 ENCOUNTER — Other Ambulatory Visit: Payer: Self-pay

## 2020-01-13 ENCOUNTER — Encounter: Payer: Self-pay | Admitting: Rheumatology

## 2020-01-13 VITALS — BP 127/84 | HR 72 | Resp 16 | Ht 69.0 in | Wt 241.4 lb

## 2020-01-13 DIAGNOSIS — Z96652 Presence of left artificial knee joint: Secondary | ICD-10-CM

## 2020-01-13 DIAGNOSIS — S46012D Strain of muscle(s) and tendon(s) of the rotator cuff of left shoulder, subsequent encounter: Secondary | ICD-10-CM

## 2020-01-13 DIAGNOSIS — F32A Depression, unspecified: Secondary | ICD-10-CM

## 2020-01-13 DIAGNOSIS — M5136 Other intervertebral disc degeneration, lumbar region: Secondary | ICD-10-CM

## 2020-01-13 DIAGNOSIS — M0579 Rheumatoid arthritis with rheumatoid factor of multiple sites without organ or systems involvement: Secondary | ICD-10-CM

## 2020-01-13 DIAGNOSIS — Z13820 Encounter for screening for osteoporosis: Secondary | ICD-10-CM

## 2020-01-13 DIAGNOSIS — F419 Anxiety disorder, unspecified: Secondary | ICD-10-CM

## 2020-01-13 DIAGNOSIS — Z8669 Personal history of other diseases of the nervous system and sense organs: Secondary | ICD-10-CM

## 2020-01-13 DIAGNOSIS — E559 Vitamin D deficiency, unspecified: Secondary | ICD-10-CM | POA: Diagnosis not present

## 2020-01-13 DIAGNOSIS — Z8639 Personal history of other endocrine, nutritional and metabolic disease: Secondary | ICD-10-CM

## 2020-01-13 DIAGNOSIS — M51369 Other intervertebral disc degeneration, lumbar region without mention of lumbar back pain or lower extremity pain: Secondary | ICD-10-CM

## 2020-01-13 DIAGNOSIS — Z780 Asymptomatic menopausal state: Secondary | ICD-10-CM

## 2020-01-13 DIAGNOSIS — R808 Other proteinuria: Secondary | ICD-10-CM

## 2020-01-13 DIAGNOSIS — Z8719 Personal history of other diseases of the digestive system: Secondary | ICD-10-CM

## 2020-01-13 DIAGNOSIS — M1711 Unilateral primary osteoarthritis, right knee: Secondary | ICD-10-CM

## 2020-01-13 DIAGNOSIS — M7062 Trochanteric bursitis, left hip: Secondary | ICD-10-CM

## 2020-01-13 DIAGNOSIS — Z79899 Other long term (current) drug therapy: Secondary | ICD-10-CM | POA: Diagnosis not present

## 2020-01-13 DIAGNOSIS — I10 Essential (primary) hypertension: Secondary | ICD-10-CM | POA: Diagnosis not present

## 2020-01-13 DIAGNOSIS — R69 Illness, unspecified: Secondary | ICD-10-CM | POA: Diagnosis not present

## 2020-01-13 MED ORDER — TRAMADOL HCL 50 MG PO TABS: ORAL_TABLET | ORAL | 2 refills | Status: DC

## 2020-01-13 MED ORDER — METHOTREXATE 2.5 MG PO TABS: 10.0000 mg | ORAL_TABLET | ORAL | 0 refills | Status: DC

## 2020-01-13 NOTE — Patient Instructions (Addendum)
COVID-19 vaccine recommendations:   COVID-19 vaccine is recommended for everyone (unless you are allergic to a vaccine component), even if you are on a medication that suppresses your immune system.   If you are on Methotrexate, Cellcept (mycophenolate), Rinvoq, Morrie Sheldon, and Olumiant- hold the medication for 1 week after each vaccine. Hold Methotrexate for 2 weeks after the single dose COVID-19 vaccine.   Do not take Tylenol or any anti-inflammatory medications (NSAIDs) 24 hours prior to the COVID-19 vaccination.   There is no direct evidence about the efficacy of the COVID-19 vaccine in individuals who are on medications that suppress the immune system.   Even if you are fully vaccinated, and you are on any medications that suppress your immune system, please continue to wear a mask, maintain at least six feet social distance and practice hand hygiene.   If you develop a COVID-19 infection, please contact your PCP or our office to determine if you need monoclonal antibody infusion.  The booster vaccine is now available for immunocompromised patients.   Please see the following web sites for updated information.   [email or web address]   Standing Labs We placed an order today for your standing lab work.   Please have your standing labs drawn in February every 3 months  If possible, please have your labs drawn 2 weeks prior to your appointment so that the provider can discuss your results at your appointment.  We have open lab daily Monday through Thursday from 8:30-12:30 PM and 1:30-4:30 PM and Friday from 8:30-12:30 PM and 1:30-4:00 PM at the office of Dr. Bo Merino, [facility].   Please be advised, patients with office appointments requiring lab work will take precedents over walk-in lab work.  If possible, please come for your lab work on Monday and Friday afternoons, as you may experience  shorter wait times. The office is located at [address], [place], [place], [place] [address] No appointment is necessary.   Labs are drawn by [facility]. Please bring your co-pay at the time of your lab draw.  You may receive a bill from [facility] for your lab work.  If you wish to have your labs drawn at another location, please call the office 24 hours in advance to send orders.  If you have any questions regarding directions or hours of operation,  please call [phone].   As a reminder, please drink plenty of water prior to coming for your lab work. Thanks!

## 2020-01-13 NOTE — Telephone Encounter (Signed)
Done erx 

## 2020-01-13 NOTE — Telephone Encounter (Signed)
Sent to Dr. John. 

## 2020-01-14 LAB — CBC WITH DIFFERENTIAL/PLATELET
Absolute Monocytes: 754 cells/uL (ref 200–950)
Basophils Absolute: 49 cells/uL (ref 0–200)
Basophils Relative: 0.6 %
Eosinophils Absolute: 82 cells/uL (ref 15–500)
Eosinophils Relative: 1 %
HCT: 38.6 % (ref 35.0–45.0)
Hemoglobin: 12.9 g/dL (ref 11.7–15.5)
Lymphs Abs: 1287 cells/uL (ref 850–3900)
MCH: 27.7 pg (ref 27.0–33.0)
MCHC: 33.4 g/dL (ref 32.0–36.0)
MCV: 83 fL (ref 80.0–100.0)
MPV: 11.4 fL (ref 7.5–12.5)
Monocytes Relative: 9.2 %
Neutro Abs: 6027 cells/uL (ref 1500–7800)
Neutrophils Relative %: 73.5 %
Platelets: 251 10*3/uL (ref 140–400)
RBC: 4.65 10*6/uL (ref 3.80–5.10)
RDW: 12.4 % (ref 11.0–15.0)
Total Lymphocyte: 15.7 %
WBC: 8.2 10*3/uL (ref 3.8–10.8)

## 2020-01-14 LAB — COMPLETE METABOLIC PANEL WITH GFR
AG Ratio: 1.4 (calc) (ref 1.0–2.5)
ALT: 16 U/L (ref 6–29)
AST: 15 U/L (ref 10–35)
Albumin: 4.3 g/dL (ref 3.6–5.1)
Alkaline phosphatase (APISO): 62 U/L (ref 37–153)
BUN: 10 mg/dL (ref 7–25)
CO2: 29 mmol/L (ref 20–32)
Calcium: 10 mg/dL (ref 8.6–10.4)
Chloride: 103 mmol/L (ref 98–110)
Creat: 0.81 mg/dL (ref 0.50–0.99)
GFR, Est African American: 88 mL/min/{1.73_m2} (ref >=60)
GFR, Est Non African American: 76 mL/min/{1.73_m2} (ref >=60)
Globulin: 3.1 g/dL (calc) (ref 1.9–3.7)
Glucose, Bld: 106 mg/dL — ABNORMAL HIGH (ref 65–99)
Potassium: 3.5 mmol/L (ref 3.5–5.3)
Sodium: 140 mmol/L (ref 135–146)
Total Bilirubin: 0.6 mg/dL (ref 0.2–1.2)
Total Protein: 7.4 g/dL (ref 6.1–8.1)

## 2020-01-14 LAB — VITAMIN D 25 HYDROXY (VIT D DEFICIENCY, FRACTURES): Vit D, 25-Hydroxy: 33 ng/mL (ref 30–100)

## 2020-01-15 ENCOUNTER — Other Ambulatory Visit: Payer: Self-pay | Admitting: Rheumatology

## 2020-01-20 ENCOUNTER — Encounter: Payer: Self-pay | Admitting: Rheumatology

## 2020-01-20 DIAGNOSIS — Z780 Asymptomatic menopausal state: Secondary | ICD-10-CM | POA: Diagnosis not present

## 2020-01-23 ENCOUNTER — Telehealth: Payer: Self-pay | Admitting: *Deleted

## 2020-01-23 NOTE — Telephone Encounter (Signed)
Received DEXA results from [facility].  Date of Scan: 01/20/2020 Lowest T-score and site measured: 0.0 Left Femoral  Significant changes in BMD and site measured (5% and above): n/a   Current Regimen: n/a  Recommendation: Repeat DEXA scan in 5 years.   Patient advised of results and recommendations.

## 2020-01-27 DIAGNOSIS — Z1231 Encounter for screening mammogram for malignant neoplasm of breast: Secondary | ICD-10-CM | POA: Diagnosis not present

## 2020-01-29 ENCOUNTER — Encounter: Payer: Self-pay | Admitting: Physician Assistant

## 2020-01-29 ENCOUNTER — Ambulatory Visit (INDEPENDENT_AMBULATORY_CARE_PROVIDER_SITE_OTHER): Payer: Medicare HMO | Admitting: Physician Assistant

## 2020-01-29 VITALS — BP 122/84 | HR 93 | Ht 69.0 in | Wt 243.0 lb

## 2020-01-29 DIAGNOSIS — K219 Gastro-esophageal reflux disease without esophagitis: Secondary | ICD-10-CM | POA: Diagnosis not present

## 2020-01-29 DIAGNOSIS — K5900 Constipation, unspecified: Secondary | ICD-10-CM | POA: Diagnosis not present

## 2020-01-29 DIAGNOSIS — K3184 Gastroparesis: Secondary | ICD-10-CM | POA: Diagnosis not present

## 2020-01-29 MED ORDER — METOCLOPRAMIDE HCL 10 MG PO TABS
10.0000 mg | ORAL_TABLET | Freq: Two times a day (BID) | ORAL | 3 refills | Status: DC
Start: 2020-01-29 — End: 2021-02-14

## 2020-01-29 MED ORDER — PANTOPRAZOLE SODIUM 40 MG PO TBEC
40.0000 mg | DELAYED_RELEASE_TABLET | Freq: Two times a day (BID) | ORAL | 2 refills | Status: DC
Start: 2020-01-29 — End: 2020-02-03

## 2020-01-29 NOTE — Patient Instructions (Addendum)
If you are age 65 or older, your body mass index should be between 23-30. Your Body mass index is 35.88 kg/m. If this is out of the aforementioned range listed, please consider follow up with your Primary Care Provider.  If you are age 17 or younger, your body mass index should be between 19-25. Your Body mass index is 35.88 kg/m. If this is out of the aformentioned range listed, please consider follow up with your Primary Care Provider.    We have sent the following medications to your pharmacy for you to pick up at your convenience: Reglan 10 mg twice daily . Pantoprazole 40 mg twice daily.   Start Miralax once daily    Thank you for choosing me and [facility].  Ellouise Newer, PA-C

## 2020-01-29 NOTE — Progress Notes (Signed)
Chief Complaint: Follow-up gastroparesis   Review of pertinent gastrointestinal problems: 1. Routine risk for colon cancer:Routine screeningcolonoscopy 12/2008Dr. Ardis Hughs found single small HP polyp, was recommended to have repeat screening in 10 years. Colonoscopy 10/2015 was normal. 2. Dysphagia; EGD 04/2015 Dr. Ardis Hughs found thin Schatzki's ring (dilated to 36mm), also retained food in stomach, gastritis (H. Pylori neg by biopsy).  3. Gastric dysmotility:see EGD above (narcotic induced?); 2017 recommended 10mg  reglan at bedtime nightly, smaller more frequent meals; 02/2011 GES showed slightly slow gastric emptying. 4. Early satiety, weight loss: 2017 workup; CT scan abd/pelv 09/2015 essentially normal.       HPI:    Pamela [name] is a 64 year old African-American female with a past medical history as listed below including reflux, known to Dr. Silverio Decamp, who presents to clinic today for follow-up of her gastroparesis.    11/02/2017 patient seen in clinic by Dr. Ardis Hughs.  At that time still had early satiety and poor appetite with gagging in the morning.  She thought she had lost 20 pounds that year, had not been taking pantoprazole and took Reglan 10 mg at bedtime as well as tramadol daily.  It was discussed this with the same issue she had 2 years ago and it was suspicious that it was from her proven gastroparesis.  Previous EGD had showed remaining gastric contents, gastric emptying scan remotely showed slow gastric emptying.  Her Reglan was increased to a.m. dosing in addition to her usual bedtime dosing.  Is also recommended that she take her pantoprazole shortly before breakfast.    Today, the patient presents clinic and tells me that she continues with a lot of her chronic complaints including in the morning when she wakes up a lot of coughing and gagging of mucus.  She continues her Pantoprazole 40 mg every morning but the symptoms typically occur before she takes this medicine.  Also  describes occasional epigastric discomfort but is unable to describe it past that.  Tells me it is not burning and not cramping and not nausea.  Continues on her Reglan 10 mg twice daily and does feel like this helps with her symptoms.    Also describes some constipation issues telling me that she only has 1 bowel movement every 3 to 4 days.  She started Metamucil about 2 weeks ago and drinks a lot of water but nothing has changed.    Excited that her daughter is cooking Thanksgiving this year for the first time.  She just gets to go and bring a side dish.    Denies fever, chills, weight loss or blood in her stool.  Past Medical History:  Diagnosis Date  . ALLERGIC RHINITIS 12/18/2006   Qualifier: Diagnosis of  By: [place], [place]    . Anxiety   . Arthritis   . DEPRESSION, SITUATIONAL 03/15/2007   Qualifier: Diagnosis of  By: Wynona Luna   . GERD (gastroesophageal reflux disease)   . Headache    occasional  . History of kidney stones   . HYPERLIPIDEMIA 12/18/2006   Qualifier: History of  By: Danny Lawless CMA, [place]    . Hypertension   . HYPERTENSION 12/18/2006   Qualifier: Diagnosis of  By: Danny Lawless CMA, [place]    . INSOMNIA-SLEEP DISORDER-UNSPEC 08/25/2009   Qualifier: Diagnosis of  By: Jenny Reichmann MD, Hunt Oris   . Pain in knee region after total knee replacement ([place]) 04/03/2012  . Pneumonia   . RENAL CALCULUS, HX OF 12/18/2006   Qualifier: Diagnosis of  By:  [place] CMA, [place]      Past Surgical History:  Procedure Laterality Date  . CERVICAL DISC SURGERY     x 3  . CONVERSION TO TOTAL KNEE Left 10/09/2016   Procedure: Conversion left uni compartment arthroplasty to total knee arthroplasty;  Surgeon: Paralee Cancel, MD;  Location: [facility];  Service: Orthopedics;  Laterality: Left;  90 mins  . KNEE ARTHROSCOPY     partial  . total left knee arthoplasty     10/09/16 Dr. Alvan Dame    Current Outpatient Medications  Medication Sig Dispense Refill  . Adalimumab (HUMIRA PEN) 40 MG/0.4ML PNKT  Inject 40 mg into the skin every 14 (fourteen) days. 3 each 0  . ALPRAZolam (XANAX) 0.25 MG tablet TAKE 1 TABLET BY MOUTH TWICE DAILY AS NEEDED 60 tablet 2  . atorvastatin (LIPITOR) 20 MG tablet TAKE 1 TABLET BY MOUTH DAILY 90 tablet 0  . B-D TB SYRINGE 1CC/27GX1/2" 27G X 1/2" 1 ML MISC USE ONCE WEEKLY    . folic acid (FOLVITE) 1 MG tablet Take 2 tablets (2 mg total) by mouth daily. 180 tablet 3  . gabapentin (NEURONTIN) 300 MG capsule Take 1 capsule (300 mg total) by mouth 2 (two) times daily. 180 capsule 1  . hydrochlorothiazide (MICROZIDE) 12.5 MG capsule TAKE 1 CAPSULE(12.5 MG) BY MOUTH DAILY 90 capsule 3  . Insulin Syringe-Needle U-100 28G X 5/16" 1 ML MISC Use 1 syringe every 7 days to inject methotrexate. 12 each 3  . losartan (COZAAR) 100 MG tablet Take 1 tablet (100 mg total) by mouth daily. 90 tablet 3  . methotrexate (RHEUMATREX) 2.5 MG tablet Take 4 tablets (10 mg total) by mouth once a week. Caution:Chemotherapy. Protect from light. 48 tablet 0  . metoCLOPramide (REGLAN) 10 MG tablet TAKE 1 TABLET BY MOUTH TWICE DAILY MORNING AND BEDTIME 60 tablet 1  . Multiple Vitamin (MULTIVITAMIN WITH MINERALS) TABS tablet Take 1 tablet by mouth daily.    . pantoprazole (PROTONIX) 40 MG tablet TAKE 1 TABLET BY MOUTH DAILY. 30 tablet 1  . sertraline (ZOLOFT) 100 MG tablet Take 1 tablet (100 mg total) by mouth daily. 90 tablet 3  . traMADol (ULTRAM) 50 MG tablet TAKE 1 TABLET BY MOUTH EVERY 6 HOURS AS NEEDED FOR PAIN 120 tablet 2  . XALATAN 0.005 % ophthalmic solution Place 1 drop into the right eye at bedtime.  (Patient not taking: Reported on 01/13/2020)    . zolpidem (AMBIEN) 10 MG tablet TAKE 1 TABLET BY MOUTH EVERYDAY AT BEDTIME 90 tablet 1   No current facility-administered medications for this visit.    Allergies as of 01/29/2020 - Review Complete 01/13/2020  Allergen Reaction Noted  . Cymbalta [duloxetine hcl] Other (See Comments) 07/08/2019    Family History  Problem Relation Age of  Onset  . Stroke Mother   . Heart disease Father   . Lung cancer Sister   . Lung cancer Brother   . Healthy Daughter   . Healthy Daughter   . Healthy Daughter     Social History   Socioeconomic History  . Marital status: Married    Spouse name: Not on file  . Number of children: 3  . Years of education: Not on file  . Highest education level: Not on file  Occupational History  . Not on file  Tobacco Use  . Smoking status: Former Smoker    Packs/day: 0.25    Years: 3.00    Pack years: 0.75    Types: Cigarettes  Quit date: 1970    Years since quitting: 51.9  . Smokeless tobacco: Never Used  . Tobacco comment: 1970's  Vaping Use  . Vaping Use: Never used  Substance and Sexual Activity  . Alcohol use: Yes    Alcohol/week: 0.0 standard drinks    Comment: occ  . Drug use: No  . Sexual activity: Yes  Other Topics Concern  . Not on file  Social History Narrative  . Not on file   Social Determinants of Health   Financial Resource Strain:   . Difficulty of Paying Living Expenses: Not on file  Food Insecurity:   . Worried About Charity fundraiser in the Last Year: Not on file  . Ran Out of Food in the Last Year: Not on file  Transportation Needs:   . Lack of Transportation (Medical): Not on file  . Lack of Transportation (Non-Medical): Not on file  Physical Activity:   . Days of Exercise per Week: Not on file  . Minutes of Exercise per Session: Not on file  Stress:   . Feeling of Stress : Not on file  Social Connections:   . Frequency of Communication with Friends and Family: Not on file  . Frequency of Social Gatherings with Friends and Family: Not on file  . Attends Religious Services: Not on file  . Active Member of Clubs or Organizations: Not on file  . Attends Archivist Meetings: Not on file  . Marital Status: Not on file  Intimate Partner Violence:   . Fear of Current or Ex-Partner: Not on file  . Emotionally Abused: Not on file  .  Physically Abused: Not on file  . Sexually Abused: Not on file    Review of Systems:    Constitutional: No weight loss, fever or chills Cardiovascular: No chest pain Respiratory: No SOB  Gastrointestinal: See HPI and otherwise negative   Physical Exam:  Vital signs: BP 122/84 (BP Location: Left Arm, Patient Position: Sitting, Cuff Size: Large)   Pulse 93   Ht 5\' 9"  (1.753 m)   Wt 243 lb (110.2 kg)   BMI 35.88 kg/m   Constitutional:   Pleasant obese AA female appears to be in NAD, Well developed, Well nourished, alert and cooperative Respiratory: Respirations even and unlabored. Lungs clear to auscultation bilaterally.   No wheezes, crackles, or rhonchi.  Cardiovascular: Normal S1, S2. No MRG. Regular rate and rhythm. No peripheral edema, cyanosis or pallor.  Gastrointestinal:  Soft, nondistended, mild epigastric ttp, No rebound or guarding. Normal bowel sounds. No appreciable masses or hepatomegaly. Rectal:  Not performed.  Psychiatric: Demonstrates good judgement and reason without abnormal affect or behaviors.  RELEVANT LABS AND IMAGING: CBC    Component Value Date/Time   WBC 8.2 01/13/2020 1551   RBC 4.65 01/13/2020 1551   HGB 12.9 01/13/2020 1551   HGB 12.6 12/09/2019 1401   HCT 38.6 01/13/2020 1551   PLT 251 01/13/2020 1551   PLT 250 12/09/2019 1401   MCV 83.0 01/13/2020 1551   MCH 27.7 01/13/2020 1551   MCHC 33.4 01/13/2020 1551   RDW 12.4 01/13/2020 1551   LYMPHSABS 1,287 01/13/2020 1551   MONOABS 0.8 12/09/2019 1401   EOSABS 82 01/13/2020 1551   BASOSABS 49 01/13/2020 1551    CMP     Component Value Date/Time   NA 140 01/13/2020 1551   K 3.5 01/13/2020 1551   CL 103 01/13/2020 1551   CO2 29 01/13/2020 1551   GLUCOSE 106 (H) 01/13/2020  1551   BUN 10 01/13/2020 1551   CREATININE 0.81 01/13/2020 1551   CALCIUM 10.0 01/13/2020 1551   PROT 7.4 01/13/2020 1551   ALBUMIN 4.0 12/09/2019 1401   AST 15 01/13/2020 1551   AST 23 12/09/2019 1401   ALT 16  01/13/2020 1551   ALT 21 12/09/2019 1401   ALKPHOS 72 12/09/2019 1401   BILITOT 0.6 01/13/2020 1551   BILITOT 0.5 12/09/2019 1401   GFRNONAA 76 01/13/2020 1551   GFRAA 88 01/13/2020 1551    Assessment: 1.  Gastroparesis: Delayed gastric emptying study remotely, EGD with food residue, better with Reglan 10 mg twice daily 2.  Constipation: 1 bowel movement every 3 to 4 days, this is hard and typically some straining 3.  GERD: Still wakes up with some coughing and gagging; consider reflux versus postnasal drip versus other  Plan: 1.  Continue Reglan 10 mg twice daily, every morning and q. at bedtime, prescribed #180 with 3 refills 2.  Increase Pantoprazole to 40 mg twice daily, 30-60 minutes for breakfast and dinner #60 with 3 refills. 3.  Discussed starting MiraLAX once daily for constipation and continue Metamucil 4.  Patient return to clinic in 2 months for follow-up with me.  At that time can discuss decreasing Pantoprazole.  Pamela Newer, PA-C [facility] 01/29/2020, 9:59 AM  Cc: Biagio Borg, MD

## 2020-01-30 DIAGNOSIS — H5213 Myopia, bilateral: Secondary | ICD-10-CM | POA: Diagnosis not present

## 2020-01-30 NOTE — Progress Notes (Signed)
I agree with the above note, plan 

## 2020-02-03 ENCOUNTER — Other Ambulatory Visit: Payer: Self-pay

## 2020-02-03 MED ORDER — PANTOPRAZOLE SODIUM 40 MG PO TBEC
40.0000 mg | DELAYED_RELEASE_TABLET | Freq: Two times a day (BID) | ORAL | 2 refills | Status: DC
Start: 2020-02-03 — End: 2020-07-01

## 2020-02-10 ENCOUNTER — Ambulatory Visit: Payer: Medicare HMO | Admitting: Rheumatology

## 2020-03-01 ENCOUNTER — Telehealth: Payer: Self-pay

## 2020-03-01 DIAGNOSIS — M0579 Rheumatoid arthritis with rheumatoid factor of multiple sites without organ or systems involvement: Secondary | ICD-10-CM

## 2020-03-01 MED ORDER — HUMIRA (2 PEN) 40 MG/0.4ML ~~LOC~~ AJKT: 40.0000 mg | AUTO-INJECTOR | SUBCUTANEOUS | 0 refills | Status: DC

## 2020-03-01 NOTE — Telephone Encounter (Signed)
Last Visit: 01/13/2020 Next Visit: 04/13/2020 Labs: 01/13/2020 CBC WNL. Glucose is 106. Rest of CMP WNL.  TB Gold: 11/10/2019 Neg   Current Dose per office note 01/13/2020: not discussed   DX:  Rheumatoid arthritis involving multiple sites with positive rheumatoid factor   Okay to refill per Dr. Estanislado Pandy

## 2020-03-01 NOTE — Telephone Encounter (Signed)
Patient called requesting prescription refill of Humira to be sent to [facility].   

## 2020-03-10 ENCOUNTER — Other Ambulatory Visit: Payer: Self-pay | Admitting: Internal Medicine

## 2020-03-31 NOTE — Progress Notes (Deleted)
Office Visit Note  Patient: Pamela [name]             Date of Birth: 1955-12-01           MRN: [id number]             PCP: Biagio Borg, MD Referring: Biagio Borg, MD Visit Date: 04/13/2020 Occupation: @GUAROCC @  Subjective:    History of Present Illness: Pamela [name] is a 65 y.o. female with history of seropositive rheumatoid arthritis and osteoarthritis.  She is on humira 40 mg sq injections every 14 days, Methotrexate 4 tablets by mouth once weekly, and folic acid 1 mg po daily.   CBC and CMP updated on 01/13/20.  She is due to update lab work.  Orders for CBC and CMP released.  Her next lab work will be due in May and every 3 months.  Standing orders for CBC and CMP are in place.  TB gold negative on 11/10/19 and will continue to be monitored yearly.   Activities of Daily Living:  Patient reports morning stiffness for *** {minute/hour:19697}.   Patient {ACTIONS;DENIES/REPORTS:21021675::"Denies"} nocturnal pain.  Difficulty dressing/grooming: {ACTIONS;DENIES/REPORTS:21021675::"Denies"} Difficulty climbing stairs: {ACTIONS;DENIES/REPORTS:21021675::"Denies"} Difficulty getting out of chair: {ACTIONS;DENIES/REPORTS:21021675::"Denies"} Difficulty using hands for taps, buttons, cutlery, and/or writing: {ACTIONS;DENIES/REPORTS:21021675::"Denies"}  No Rheumatology ROS completed.   PMFS History:  Patient Active Problem List   Diagnosis Date Noted  . Spinal stenosis of lumbar region 08/05/2019  . Inability to walk 08/05/2019  . Hives 07/08/2019  . History of glaucoma 04/24/2019  . Bursitis of left hip 12/25/2018  . MGUS (monoclonal gammopathy of unknown significance) 05/11/2018  . Rheumatoid arthritis involving multiple sites with positive rheumatoid factor ([place]) 05/03/2018  . High risk medication use 05/03/2018  . Status post total left knee replacement 05/03/2018  . DDD (degenerative disc disease), lumbar 05/03/2018  . Primary osteoarthritis of right knee 04/05/2018  .  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 Left rotator cuff tear 12/19/2017  . Left wrist pain 12/12/2017  . Left shoulder pain 08/22/2017  . Acute pain of right knee 04/17/2017  . Pain of left calf 04/13/2017  . 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 Hyperglycemia 09/23/2015  . Anxiety state 09/23/2015  . Abnormal urine odor 08/25/2015  . GERD without esophagitis 08/25/2015  . Upper abdominal pain 04/16/2015  . Early satiety 04/16/2015  . Dysuria 04/16/2015  . Encounter for well adult exam with abnormal findings 06/18/2014  . Greater trochanteric bursitis of right hip 01/06/2013  . Left lumbar radiculitis 04/03/2012  . Eczema 04/03/2012  . 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 BURSITIS, RIGHT HIP 08/25/2009  . INSOMNIA-SLEEP DISORDER-UNSPEC 08/25/2009  . FATIGUE 08/25/2009  . Cervicalgia 08/14/2007  . Depression 03/15/2007  . HLD (hyperlipidemia) 12/18/2006  . Essential hypertension 12/18/2006  . Allergic rhinitis 12/18/2006  . RENAL CALCULUS, HX OF 12/18/2006    Past Medical History:  Diagnosis Date  . ALLERGIC RHINITIS 12/18/2006   Qualifier: Diagnosis of  By: [place], [place]    . Anxiety   . Arthritis   . DEPRESSION, SITUATIONAL 03/15/2007   Qualifier: Diagnosis of  By: Wynona Luna   . GERD (gastroesophageal reflux disease)   . Headache    occasional  . History of kidney stones   . HYPERLIPIDEMIA 12/18/2006   Qualifier: History of  By: Danny Lawless CMA, [place]    . Hypertension   . HYPERTENSION 12/18/2006   Qualifier: Diagnosis of  By: Danny Lawless CMA, [place]    . INSOMNIA-SLEEP DISORDER-UNSPEC  08/25/2009   Qualifier: Diagnosis of  By: Jenny Reichmann MD, Hunt Oris   . Pain in knee region after total knee replacement ([place]) 04/03/2012  . Pneumonia   . RENAL CALCULUS, HX OF 12/18/2006   Qualifier: Diagnosis of  By: Danny Lawless CMA, [place]      Family History  Problem Relation Age of Onset  . Stroke Mother   . Heart disease Father   . Lung cancer Sister   . Lung cancer Brother   . Healthy Daughter    . Healthy Daughter   . Healthy Daughter   . Colon cancer Neg Hx   . Esophageal cancer Neg Hx   . Pancreatic cancer Neg Hx   . Stomach cancer Neg Hx    Past Surgical History:  Procedure Laterality Date  . CERVICAL DISC SURGERY     x 3  . CONVERSION TO TOTAL KNEE Left 10/09/2016   Procedure: Conversion left uni compartment arthroplasty to total knee arthroplasty;  Surgeon: Paralee Cancel, MD;  Location: [facility];  Service: Orthopedics;  Laterality: Left;  90 mins  . KNEE ARTHROSCOPY     partial  . total left knee arthoplasty     10/09/16 Dr. Alvan Dame   Social History   Social History Narrative  . Not on file   Immunization History  Administered Date(s) Administered  . Influenza Whole 11/12/2012  . Influenza,inj,Quad PF,6+ Mos 12/11/2017, 12/25/2018  . Influenza-Unspecified 12/15/2013, 12/12/2015, 11/29/2016  . Td 11/03/2002  . Tdap 09/23/2015  . Zoster 08/12/2012  . Zoster Recombinat (Shingrix) 03/22/2018, 08/20/2018     Objective: Vital Signs: There were no vitals taken for this visit.   Physical Exam   Musculoskeletal Exam: ***  CDAI Exam: CDAI Score: -- Patient Global: --; Provider Global: -- Swollen: --; Tender: -- Joint Exam 04/13/2020   No joint exam has been documented for this visit   There is currently no information documented on the homunculus. Go to the Rheumatology activity and complete the homunculus joint exam.  Investigation: No additional findings.  Imaging: No results found.  Recent Labs: Lab Results  Component Value Date   WBC 8.2 01/13/2020   HGB 12.9 01/13/2020   PLT 251 01/13/2020   NA 140 01/13/2020   K 3.5 01/13/2020   CL 103 01/13/2020   CO2 29 01/13/2020   GLUCOSE 106 (H) 01/13/2020   BUN 10 01/13/2020   CREATININE 0.81 01/13/2020   BILITOT 0.6 01/13/2020   ALKPHOS 72 12/09/2019   AST 15 01/13/2020   ALT 16 01/13/2020   PROT 7.4 01/13/2020   ALBUMIN 4.0 12/09/2019   CALCIUM 10.0 01/13/2020   GFRAA 88 01/13/2020    QFTBGOLDPLUS NEGATIVE 11/10/2019    Speciality Comments: PLQ Eye Exam: 03/26/2019 WNL @ [facility] M [place] OD PA FOllow up in 1 year  Procedures:  No procedures performed Allergies: Cymbalta [duloxetine hcl]   Assessment / Plan:     Visit Diagnoses: No diagnosis found.  Orders: No orders of the defined types were placed in this encounter.  No orders of the defined types were placed in this encounter.   Face-to-face time spent with patient was *** minutes. Greater than 50% of time was spent in counseling and coordination of care.  Follow-Up Instructions: No follow-ups on file.   Earnestine Mealing, CMA  Note - This record has been created using Editor, commissioning.  Chart creation errors have been sought, but may not always  have been located. Such creation errors do not reflect on  the standard of  medical care.

## 2020-04-03 ENCOUNTER — Other Ambulatory Visit: Payer: Self-pay | Admitting: Rheumatology

## 2020-04-05 MED ORDER — METHOTREXATE 2.5 MG PO TABS: 10.0000 mg | ORAL_TABLET | ORAL | 0 refills | Status: DC

## 2020-04-05 NOTE — Telephone Encounter (Signed)
Last Visit: 01/13/2020 Next Visit: 04/13/2020 Labs: 01/13/2020, CBC WNL. Glucose is 106. Rest of CMP WNL. Vitamin D is WNL-33. Please advise the patient to take a maintenance dose of vitamin D.   Current Dose per office note 01/13/2020, methotrexate 4 tablets p.o. weekly DX: Rheumatoid arthritis involving multiple sites with positive rheumatoid factor   Okay to refill MTX?

## 2020-04-05 NOTE — Addendum Note (Signed)
Addended by: Carole Binning on: 04/05/2020 01:49 PM   Modules accepted: Orders

## 2020-04-12 ENCOUNTER — Other Ambulatory Visit: Payer: Self-pay | Admitting: Internal Medicine

## 2020-04-12 NOTE — Telephone Encounter (Signed)
Please refill as per office routine med refill policy (all routine meds refilled for 3 mo or monthly per pt preference up to one year from last visit, then month to month grace period for 3 mo, then further med refills will have to be denied)  

## 2020-04-13 ENCOUNTER — Ambulatory Visit: Payer: Medicare HMO | Admitting: Physician Assistant

## 2020-04-13 DIAGNOSIS — Z8669 Personal history of other diseases of the nervous system and sense organs: Secondary | ICD-10-CM

## 2020-04-13 DIAGNOSIS — R808 Other proteinuria: Secondary | ICD-10-CM

## 2020-04-13 DIAGNOSIS — E559 Vitamin D deficiency, unspecified: Secondary | ICD-10-CM

## 2020-04-13 DIAGNOSIS — S46012D Strain of muscle(s) and tendon(s) of the rotator cuff of left shoulder, subsequent encounter: Secondary | ICD-10-CM

## 2020-04-13 DIAGNOSIS — Z79899 Other long term (current) drug therapy: Secondary | ICD-10-CM

## 2020-04-13 DIAGNOSIS — I10 Essential (primary) hypertension: Secondary | ICD-10-CM

## 2020-04-13 DIAGNOSIS — Z8639 Personal history of other endocrine, nutritional and metabolic disease: Secondary | ICD-10-CM

## 2020-04-13 DIAGNOSIS — M1711 Unilateral primary osteoarthritis, right knee: Secondary | ICD-10-CM

## 2020-04-13 DIAGNOSIS — M5136 Other intervertebral disc degeneration, lumbar region: Secondary | ICD-10-CM

## 2020-04-13 DIAGNOSIS — F419 Anxiety disorder, unspecified: Secondary | ICD-10-CM

## 2020-04-13 DIAGNOSIS — Z13820 Encounter for screening for osteoporosis: Secondary | ICD-10-CM

## 2020-04-13 DIAGNOSIS — Z96652 Presence of left artificial knee joint: Secondary | ICD-10-CM

## 2020-04-13 DIAGNOSIS — Z8719 Personal history of other diseases of the digestive system: Secondary | ICD-10-CM

## 2020-04-13 DIAGNOSIS — M0579 Rheumatoid arthritis with rheumatoid factor of multiple sites without organ or systems involvement: Secondary | ICD-10-CM

## 2020-04-15 ENCOUNTER — Telehealth: Payer: Self-pay | Admitting: Internal Medicine

## 2020-04-15 NOTE — Telephone Encounter (Signed)
Patient called and said that the pharmacy sent over a fax for zolpidem (AMBIEN) 10 MG tablet. She said that without Dr. Judi Cong signature her insurance will not pay for it. Please advise.

## 2020-04-15 NOTE — Progress Notes (Deleted)
Office Visit Note  Patient: Pamela [name]             Date of Birth: April 04, 1955           MRN: [id number]             PCP: Biagio Borg, MD Referring: Biagio Borg, MD Visit Date: 04/22/2020 Occupation: @GUAROCC @  Subjective:  No chief complaint on file.   History of Present Illness: Pamela [name] is a 65 y.o. female ***   Activities of Daily Living:  Patient reports morning stiffness for *** {minute/hour:19697}.   Patient {ACTIONS;DENIES/REPORTS:21021675::"Denies"} nocturnal pain.  Difficulty dressing/grooming: {ACTIONS;DENIES/REPORTS:21021675::"Denies"} Difficulty climbing stairs: {ACTIONS;DENIES/REPORTS:21021675::"Denies"} Difficulty getting out of chair: {ACTIONS;DENIES/REPORTS:21021675::"Denies"} Difficulty using hands for taps, buttons, cutlery, and/or writing: {ACTIONS;DENIES/REPORTS:21021675::"Denies"}  No Rheumatology ROS completed.   PMFS History:  Patient Active Problem List   Diagnosis Date Noted  . Spinal stenosis of lumbar region 08/05/2019  . Inability to walk 08/05/2019  . Hives 07/08/2019  . History of glaucoma 04/24/2019  . Bursitis of left hip 12/25/2018  . MGUS (monoclonal gammopathy of unknown significance) 05/11/2018  . Rheumatoid arthritis involving multiple sites with positive rheumatoid factor ([place]) 05/03/2018  . High risk medication use 05/03/2018  . Status post total left knee replacement 05/03/2018  . DDD (degenerative disc disease), lumbar 05/03/2018  . Primary osteoarthritis of right knee 04/05/2018  . 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 Left rotator cuff tear 12/19/2017  . Left wrist pain 12/12/2017  . Left shoulder pain 08/22/2017  . Acute pain of right knee 04/17/2017  . Pain of left calf 04/13/2017  . 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 Hyperglycemia 09/23/2015  . Anxiety state 09/23/2015  . Abnormal urine odor 08/25/2015  . GERD without esophagitis 08/25/2015  . Upper abdominal pain 04/16/2015  . Early satiety 04/16/2015  .  Dysuria 04/16/2015  . Encounter for well adult exam with abnormal findings 06/18/2014  . Greater trochanteric bursitis of right hip 01/06/2013  . Left lumbar radiculitis 04/03/2012  . Eczema 04/03/2012  . 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 BURSITIS, RIGHT HIP 08/25/2009  . INSOMNIA-SLEEP DISORDER-UNSPEC 08/25/2009  . FATIGUE 08/25/2009  . Cervicalgia 08/14/2007  . Depression 03/15/2007  . HLD (hyperlipidemia) 12/18/2006  . Essential hypertension 12/18/2006  . Allergic rhinitis 12/18/2006  . RENAL CALCULUS, HX OF 12/18/2006    Past Medical History:  Diagnosis Date  . ALLERGIC RHINITIS 12/18/2006   Qualifier: Diagnosis of  By: [place], [place]    . Anxiety   . Arthritis   . DEPRESSION, SITUATIONAL 03/15/2007   Qualifier: Diagnosis of  By: Wynona Luna   . GERD (gastroesophageal reflux disease)   . Headache    occasional  . History of kidney stones   . HYPERLIPIDEMIA 12/18/2006   Qualifier: History of  By: Danny Lawless CMA, [place]    . Hypertension   . HYPERTENSION 12/18/2006   Qualifier: Diagnosis of  By: Danny Lawless CMA, [place]    . INSOMNIA-SLEEP DISORDER-UNSPEC 08/25/2009   Qualifier: Diagnosis of  By: Jenny Reichmann MD, Hunt Oris   . Pain in knee region after total knee replacement ([place]) 04/03/2012  . Pneumonia   . RENAL CALCULUS, HX OF 12/18/2006   Qualifier: Diagnosis of  By: Danny Lawless CMA, [place]      Family History  Problem Relation Age of Onset  . Stroke Mother   . Heart disease Father   . Lung cancer Sister   . Lung cancer Brother   . Healthy Daughter   .  Healthy Daughter   . Healthy Daughter   . Colon cancer Neg Hx   . Esophageal cancer Neg Hx   . Pancreatic cancer Neg Hx   . Stomach cancer Neg Hx    Past Surgical History:  Procedure Laterality Date  . CERVICAL DISC SURGERY     x 3  . CONVERSION TO TOTAL KNEE Left 10/09/2016   Procedure: Conversion left uni compartment arthroplasty to total knee arthroplasty;  Surgeon: Paralee Cancel, MD;  Location: [facility];  Service: Orthopedics;   Laterality: Left;  90 mins  . KNEE ARTHROSCOPY     partial  . total left knee arthoplasty     10/09/16 Dr. Alvan Dame   Social History   Social History Narrative  . Not on file   Immunization History  Administered Date(s) Administered  . Influenza Whole 11/12/2012  . Influenza,inj,Quad PF,6+ Mos 12/11/2017, 12/25/2018  . Influenza-Unspecified 12/15/2013, 12/12/2015, 11/29/2016  . Td 11/03/2002  . Tdap 09/23/2015  . Zoster 08/12/2012  . Zoster Recombinat (Shingrix) 03/22/2018, 08/20/2018     Objective: Vital Signs: There were no vitals taken for this visit.   Physical Exam   Musculoskeletal Exam: ***  CDAI Exam: CDAI Score: -- Patient Global: --; Provider Global: -- Swollen: --; Tender: -- Joint Exam 04/22/2020   No joint exam has been documented for this visit   There is currently no information documented on the homunculus. Go to the Rheumatology activity and complete the homunculus joint exam.  Investigation: No additional findings.  Imaging: No results found.  Recent Labs: Lab Results  Component Value Date   WBC 8.2 01/13/2020   HGB 12.9 01/13/2020   PLT 251 01/13/2020   NA 140 01/13/2020   K 3.5 01/13/2020   CL 103 01/13/2020   CO2 29 01/13/2020   GLUCOSE 106 (H) 01/13/2020   BUN 10 01/13/2020   CREATININE 0.81 01/13/2020   BILITOT 0.6 01/13/2020   ALKPHOS 72 12/09/2019   AST 15 01/13/2020   ALT 16 01/13/2020   PROT 7.4 01/13/2020   ALBUMIN 4.0 12/09/2019   CALCIUM 10.0 01/13/2020   GFRAA 88 01/13/2020   QFTBGOLDPLUS NEGATIVE 11/10/2019    Speciality Comments: PLQ Eye Exam: 03/26/2019 WNL @ [facility] Mustard M [facility] OD PA FOllow up in 1 year  Procedures:  No procedures performed Allergies: Cymbalta [duloxetine hcl]   Assessment / Plan:     Visit Diagnoses: Rheumatoid arthritis involving multiple sites with positive rheumatoid factor ([place]) -  ANA 1:160 Cytoplasmic,RF 116, sed rate 22  High risk medication use - Humira-started 12/01/19, MTX 0.5 ml sq  injections once weekly, folic acid 1 mg po   Primary osteoarthritis of right knee  S/P left TK revision  Traumatic incomplete tear of left rotator cuff, subsequent encounter  DDD (degenerative disc disease), lumbar  Anxiety and depression  History of hyperlipidemia  Essential hypertension  History of glaucoma  Other proteinuria  History of gastroesophageal reflux (GERD)  Vitamin D deficiency  Trochanteric bursitis, left hip  Orders: No orders of the defined types were placed in this encounter.  No orders of the defined types were placed in this encounter.   Face-to-face time spent with patient was *** minutes. Greater than 50% of time was spent in counseling and coordination of care.  Follow-Up Instructions: No follow-ups on file.   Ofilia Neas, PA-C  Note - This record has been created using Dragon software.  Chart creation errors have been sought, but may not always  have been located. Such creation errors  do not reflect on  the standard of medical care.

## 2020-04-16 ENCOUNTER — Other Ambulatory Visit: Payer: Self-pay

## 2020-04-16 ENCOUNTER — Ambulatory Visit (INDEPENDENT_AMBULATORY_CARE_PROVIDER_SITE_OTHER): Payer: Medicare HMO | Admitting: *Deleted

## 2020-04-16 DIAGNOSIS — Z23 Encounter for immunization: Secondary | ICD-10-CM

## 2020-04-16 NOTE — Telephone Encounter (Signed)
Patient called again to [facility] in regards to the rx. Patient stated paperwork needs to be filled out, she is having trouble sleeping at night.

## 2020-04-19 ENCOUNTER — Telehealth: Payer: Self-pay

## 2020-04-19 NOTE — Telephone Encounter (Signed)
Prior Auth has been submitted for Zolpidem    Pamela [name] (Key: [id number]

## 2020-04-19 NOTE — Telephone Encounter (Signed)
I dont recall a fax  I think they mean a PA and there may have been a request for this I gave back to you

## 2020-04-19 NOTE — Telephone Encounter (Signed)
    Please obtain prior auth for Ambien

## 2020-04-20 ENCOUNTER — Telehealth: Payer: Self-pay

## 2020-04-20 NOTE — Telephone Encounter (Signed)
Patient calling for status of prior auth

## 2020-04-20 NOTE — Telephone Encounter (Signed)
Notified pt a decision has not yet been made for the prior auth.

## 2020-04-21 ENCOUNTER — Telehealth: Payer: Self-pay

## 2020-04-21 NOTE — Telephone Encounter (Signed)
Still waiting on determination for PA

## 2020-04-21 NOTE — Telephone Encounter (Signed)
Follow up message  Patient requesting prior auth information be faxed to [phone]

## 2020-04-22 ENCOUNTER — Ambulatory Visit: Payer: Medicare HMO | Admitting: Physician Assistant

## 2020-04-22 DIAGNOSIS — M0579 Rheumatoid arthritis with rheumatoid factor of multiple sites without organ or systems involvement: Secondary | ICD-10-CM

## 2020-04-22 DIAGNOSIS — Z79899 Other long term (current) drug therapy: Secondary | ICD-10-CM

## 2020-04-22 DIAGNOSIS — M5136 Other intervertebral disc degeneration, lumbar region: Secondary | ICD-10-CM

## 2020-04-22 DIAGNOSIS — I10 Essential (primary) hypertension: Secondary | ICD-10-CM

## 2020-04-22 DIAGNOSIS — Z96652 Presence of left artificial knee joint: Secondary | ICD-10-CM

## 2020-04-22 DIAGNOSIS — F32A Depression, unspecified: Secondary | ICD-10-CM

## 2020-04-22 DIAGNOSIS — M1711 Unilateral primary osteoarthritis, right knee: Secondary | ICD-10-CM

## 2020-04-22 DIAGNOSIS — E559 Vitamin D deficiency, unspecified: Secondary | ICD-10-CM

## 2020-04-22 DIAGNOSIS — Z8719 Personal history of other diseases of the digestive system: Secondary | ICD-10-CM

## 2020-04-22 DIAGNOSIS — Z8639 Personal history of other endocrine, nutritional and metabolic disease: Secondary | ICD-10-CM

## 2020-04-22 DIAGNOSIS — Z8669 Personal history of other diseases of the nervous system and sense organs: Secondary | ICD-10-CM

## 2020-04-22 DIAGNOSIS — R808 Other proteinuria: Secondary | ICD-10-CM

## 2020-04-22 DIAGNOSIS — S46012D Strain of muscle(s) and tendon(s) of the rotator cuff of left shoulder, subsequent encounter: Secondary | ICD-10-CM

## 2020-04-22 DIAGNOSIS — M7062 Trochanteric bursitis, left hip: Secondary | ICD-10-CM

## 2020-04-23 ENCOUNTER — Telehealth: Payer: Self-pay

## 2020-04-23 NOTE — Telephone Encounter (Signed)
Prior was denied appeal has been sent in under new key: [id number]

## 2020-04-26 ENCOUNTER — Other Ambulatory Visit: Payer: Self-pay | Admitting: Internal Medicine

## 2020-04-26 MED ORDER — ZOLPIDEM TARTRATE 10 MG PO TABS: ORAL_TABLET | ORAL | 0 refills | Status: DC

## 2020-04-30 NOTE — Progress Notes (Deleted)
Office Visit Note  Patient: Pamela [name]             Date of Birth: [birth year]           MRN: [id number]             PCP: Biagio Borg, MD Referring: Biagio Borg, MD Visit Date: 05/13/2020 Occupation: @GUAROCC @  Subjective:  No chief complaint on file.   History of Present Illness: Pamela [name] is a 65 y.o. female ***   Activities of Daily Living:  Patient reports morning stiffness for *** {minute/hour:19697}.   Patient {ACTIONS;DENIES/REPORTS:21021675::"Denies"} nocturnal pain.  Difficulty dressing/grooming: {ACTIONS;DENIES/REPORTS:21021675::"Denies"} Difficulty climbing stairs: {ACTIONS;DENIES/REPORTS:21021675::"Denies"} Difficulty getting out of chair: {ACTIONS;DENIES/REPORTS:21021675::"Denies"} Difficulty using hands for taps, buttons, cutlery, and/or writing: {ACTIONS;DENIES/REPORTS:21021675::"Denies"}  No Rheumatology ROS completed.   PMFS History:  Patient Active Problem List   Diagnosis Date Noted  . Spinal stenosis of lumbar region 08/05/2019  . Inability to walk 08/05/2019  . Hives 07/08/2019  . History of glaucoma 04/24/2019  . Bursitis of left hip 12/25/2018  . MGUS (monoclonal gammopathy of unknown significance) 05/11/2018  . Rheumatoid arthritis involving multiple sites with positive rheumatoid factor ([place]) 05/03/2018  . High risk medication use 05/03/2018  . Status post total left knee replacement 05/03/2018  . DDD (degenerative disc disease), lumbar 05/03/2018  . Primary osteoarthritis of right knee 04/05/2018  . 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 Left rotator cuff tear 12/19/2017  . Left wrist pain 12/12/2017  . Left shoulder pain 08/22/2017  . Acute pain of right knee 04/17/2017  . Pain of left calf 04/13/2017  . 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 Hyperglycemia 09/23/2015  . Anxiety state 09/23/2015  . Abnormal urine odor 08/25/2015  . GERD without esophagitis 08/25/2015  . Upper abdominal pain 04/16/2015  . Early satiety 04/16/2015  .  Dysuria 04/16/2015  . Encounter for well adult exam with abnormal findings 06/18/2014  . Greater trochanteric bursitis of right hip 01/06/2013  . Left lumbar radiculitis 04/03/2012  . Eczema 04/03/2012  . 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 BURSITIS, RIGHT HIP 08/25/2009  . INSOMNIA-SLEEP DISORDER-UNSPEC 08/25/2009  . FATIGUE 08/25/2009  . Cervicalgia 08/14/2007  . Depression 03/15/2007  . HLD (hyperlipidemia) 12/18/2006  . Essential hypertension 12/18/2006  . Allergic rhinitis 12/18/2006  . RENAL CALCULUS, HX OF 12/18/2006    Past Medical History:  Diagnosis Date  . ALLERGIC RHINITIS 12/18/2006   Qualifier: Diagnosis of  By: [place], [place]    . Anxiety   . Arthritis   . DEPRESSION, SITUATIONAL 03/15/2007   Qualifier: Diagnosis of  By: Wynona Luna   . GERD (gastroesophageal reflux disease)   . Headache    occasional  . History of kidney stones   . HYPERLIPIDEMIA 12/18/2006   Qualifier: History of  By: Danny Lawless CMA, [place]    . Hypertension   . HYPERTENSION 12/18/2006   Qualifier: Diagnosis of  By: Danny Lawless CMA, [place]    . INSOMNIA-SLEEP DISORDER-UNSPEC 08/25/2009   Qualifier: Diagnosis of  By: Jenny Reichmann MD, Hunt Oris   . Pain in knee region after total knee replacement ([place]) 04/03/2012  . Pneumonia   . RENAL CALCULUS, HX OF 12/18/2006   Qualifier: Diagnosis of  By: Danny Lawless CMA, [place]      Family History  Problem Relation Age of Onset  . Stroke Mother   . Heart disease Father   . Lung cancer Sister   . Lung cancer Brother   . Healthy Daughter   .  Healthy Daughter   . Healthy Daughter   . Colon cancer Neg Hx   . Esophageal cancer Neg Hx   . Pancreatic cancer Neg Hx   . Stomach cancer Neg Hx    Past Surgical History:  Procedure Laterality Date  . CERVICAL DISC SURGERY     x 3  . CONVERSION TO TOTAL KNEE Left 10/09/2016   Procedure: Conversion left uni compartment arthroplasty to total knee arthroplasty;  Surgeon: Paralee Cancel, MD;  Location: [facility];  Service: Orthopedics;   Laterality: Left;  90 mins  . KNEE ARTHROSCOPY     partial  . total left knee arthoplasty     10/09/16 Dr. Alvan Dame   Social History   Social History Narrative  . Not on file   Immunization History  Administered Date(s) Administered  . Fluad Quad(high Dose 65+) 04/16/2020  . Influenza Whole 11/12/2012  . Influenza,inj,Quad PF,6+ Mos 11/29/2016, 12/11/2017, 12/25/2018  . Influenza-Unspecified 12/15/2013, 12/12/2015, 11/29/2016  . Td 11/03/2002  . Tdap 09/23/2015  . Zoster 08/12/2012  . Zoster Recombinat (Shingrix) 03/22/2018, 08/20/2018     Objective: Vital Signs: There were no vitals taken for this visit.   Physical Exam   Musculoskeletal Exam: ***  CDAI Exam: CDAI Score: -- Patient Global: --; Provider Global: -- Swollen: --; Tender: -- Joint Exam 05/13/2020   No joint exam has been documented for this visit   There is currently no information documented on the homunculus. Go to the Rheumatology activity and complete the homunculus joint exam.  Investigation: No additional findings.  Imaging: No results found.  Recent Labs: Lab Results  Component Value Date   WBC 8.2 01/13/2020   HGB 12.9 01/13/2020   PLT 251 01/13/2020   NA 140 01/13/2020   K 3.5 01/13/2020   CL 103 01/13/2020   CO2 29 01/13/2020   GLUCOSE 106 (H) 01/13/2020   BUN 10 01/13/2020   CREATININE 0.81 01/13/2020   BILITOT 0.6 01/13/2020   ALKPHOS 72 12/09/2019   AST 15 01/13/2020   ALT 16 01/13/2020   PROT 7.4 01/13/2020   ALBUMIN 4.0 12/09/2019   CALCIUM 10.0 01/13/2020   GFRAA 88 01/13/2020   QFTBGOLDPLUS NEGATIVE 11/10/2019    Speciality Comments: PLQ Eye Exam: 03/26/2019 WNL @ [facility] Mustard M Mc[facility]land OD PA FOllow up in 1 year  Procedures:  No procedures performed Allergies: Cymbalta [duloxetine hcl]   Assessment / Plan:     Visit Diagnoses: No diagnosis found.  Orders: No orders of the defined types were placed in this encounter.  No orders of the defined types were placed in  this encounter.   Face-to-face time spent with patient was *** minutes. Greater than 50% of time was spent in counseling and coordination of care.  Follow-Up Instructions: No follow-ups on file.   Earnestine Mealing, CMA  Note - This record has been created using Editor, commissioning.  Chart creation errors have been sought, but may not always  have been located. Such creation errors do not reflect on  the standard of medical care.

## 2020-05-11 ENCOUNTER — Ambulatory Visit (INDEPENDENT_AMBULATORY_CARE_PROVIDER_SITE_OTHER): Payer: Medicare HMO | Admitting: Internal Medicine

## 2020-05-11 ENCOUNTER — Other Ambulatory Visit: Payer: Self-pay

## 2020-05-11 ENCOUNTER — Encounter: Payer: Self-pay | Admitting: Internal Medicine

## 2020-05-11 ENCOUNTER — Ambulatory Visit (INDEPENDENT_AMBULATORY_CARE_PROVIDER_SITE_OTHER): Payer: Medicare HMO

## 2020-05-11 VITALS — BP 138/86 | HR 83 | Ht 69.0 in | Wt 244.0 lb

## 2020-05-11 DIAGNOSIS — M48061 Spinal stenosis, lumbar region without neurogenic claudication: Secondary | ICD-10-CM

## 2020-05-11 DIAGNOSIS — M545 Low back pain, unspecified: Secondary | ICD-10-CM | POA: Diagnosis not present

## 2020-05-11 DIAGNOSIS — I10 Essential (primary) hypertension: Secondary | ICD-10-CM

## 2020-05-11 DIAGNOSIS — R739 Hyperglycemia, unspecified: Secondary | ICD-10-CM

## 2020-05-11 DIAGNOSIS — M5416 Radiculopathy, lumbar region: Secondary | ICD-10-CM | POA: Diagnosis not present

## 2020-05-11 MED ORDER — KETOROLAC TROMETHAMINE 30 MG/ML IJ SOLN
30.0000 mg | Freq: Once | INTRAMUSCULAR | Status: AC
  Administered 2020-05-11: 30 mg via INTRAMUSCULAR

## 2020-05-11 MED ORDER — KETOROLAC TROMETHAMINE 30 MG/ML IJ SOLN: 30.0000 mg | Freq: Once | INTRAMUSCULAR | Status: AC

## 2020-05-11 MED ORDER — CYCLOBENZAPRINE HCL 5 MG PO TABS: 5.0000 mg | ORAL_TABLET | Freq: Three times a day (TID) | ORAL | 1 refills | Status: DC | PRN

## 2020-05-11 NOTE — Progress Notes (Signed)
Patient ID: Pamela [name], female   DOB: 1956/02/07, 65 y.o.   MRN: [id number]        Chief Complaint: follow lbp, htn, hyperglycemia       HPI:  Pamela [name] is a 65 y.o. female here with c/o 3 days onset worsening lbp; did have a fall about 3 mo ago, so hard to say is related, but has dull and sharp 8/10 at times worsening midline lbp with some pain tot he right lower back and leg assoc with intermittent numbness and mild weakness as well; Pain is worse to twist at waist or turn over in bed.  Has known prior lumbar spinal stenosis. Nothing else seems to make better or worse.    Pt denies polydipsia, polyuria.  Pt denies chest pain, increased sob or doe, wheezing, orthopnea, PND, increased LE swelling, palpitations, dizziness or syncope.  Denies other new worsening focal neuro s/s.  Pt denies fever, wt loss, night sweats, loss of appetite, or other constitutional symptoms         Wt Readings from Last 3 Encounters:  05/14/20 246 lb (111.6 kg)  05/11/20 244 lb (110.7 kg)  01/29/20 243 lb (110.2 kg)   BP Readings from Last 3 Encounters:  05/14/20 124/81  05/11/20 138/86  01/29/20 122/84         Past Medical History:  Diagnosis Date  . ALLERGIC RHINITIS 12/18/2006   Qualifier: Diagnosis of  By: [place], [place]    . Anxiety   . Arthritis   . DEPRESSION, SITUATIONAL 03/15/2007   Qualifier: Diagnosis of  By: Wynona Luna   . GERD (gastroesophageal reflux disease)   . Headache    occasional  . History of kidney stones   . HYPERLIPIDEMIA 12/18/2006   Qualifier: History of  By: Danny Lawless CMA, [place]    . Hypertension   . HYPERTENSION 12/18/2006   Qualifier: Diagnosis of  By: Danny Lawless CMA, [place]    . INSOMNIA-SLEEP DISORDER-UNSPEC 08/25/2009   Qualifier: Diagnosis of  By: Jenny Reichmann MD, Hunt Oris   . Pain in knee region after total knee replacement ([place]) 04/03/2012  . Pneumonia   . RENAL CALCULUS, HX OF 12/18/2006   Qualifier: Diagnosis of  By: [place], [place]     Past Surgical History:   Procedure Laterality Date  . CERVICAL DISC SURGERY     x 3  . CONVERSION TO TOTAL KNEE Left 10/09/2016   Procedure: Conversion left uni compartment arthroplasty to total knee arthroplasty;  Surgeon: Paralee Cancel, MD;  Location: [facility];  Service: Orthopedics;  Laterality: Left;  90 mins  . KNEE ARTHROSCOPY     partial  . total left knee arthoplasty     10/09/16 Dr. Alvan Dame    reports that she quit smoking about 52 years ago. Her smoking use included cigarettes. She has a 0.75 pack-year smoking history. She has never used smokeless tobacco. She reports current alcohol use. She reports that she does not use drugs. family history includes Healthy in her daughter, daughter, and daughter; Heart disease in her father; Lung cancer in her brother and sister; Stroke in her mother. Allergies  Allergen Reactions  . Cymbalta [Duloxetine Hcl] Other (See Comments)    Feeling weird   Current Outpatient Medications on File Prior to Visit  Medication Sig Dispense Refill  . Adalimumab (HUMIRA PEN) 40 MG/0.4ML PNKT Inject 40 mg into the skin every 14 (fourteen) days. 3 each 0  . ALPRAZolam (XANAX) 0.25 MG tablet TAKE 1 TABLET  BY MOUTH TWICE DAILY AS NEEDED 60 tablet 2  . atorvastatin (LIPITOR) 20 MG tablet TAKE 1 TABLET BY MOUTH DAILY (Patient not taking: Reported on 05/14/2020) 90 tablet 0  . B-D TB SYRINGE 1CC/27GX1/2" 27G X 1/2" 1 ML MISC USE ONCE WEEKLY    . folic acid (FOLVITE) 1 MG tablet Take 2 tablets (2 mg total) by mouth daily. 180 tablet 3  . gabapentin (NEURONTIN) 300 MG capsule Take 1 capsule (300 mg total) by mouth 2 (two) times daily. 180 capsule 1  . hydrochlorothiazide (MICROZIDE) 12.5 MG capsule TAKE 1 CAPSULE(12.5 MG) BY MOUTH DAILY 90 capsule 3  . Insulin Syringe-Needle U-100 28G X 5/16" 1 ML MISC Use 1 syringe every 7 days to inject methotrexate. 12 each 3  . losartan (COZAAR) 100 MG tablet TAKE 1 TABLET BY MOUTH DAILY 90 tablet 3  . methotrexate (RHEUMATREX) 2.5 MG tablet Take 4 tablets (10  mg total) by mouth once a week. Caution:Chemotherapy. Protect from light. 48 tablet 0  . metoCLOPramide (REGLAN) 10 MG tablet Take 1 tablet (10 mg total) by mouth in the morning and at bedtime. 180 tablet 3  . Multiple Vitamin (MULTIVITAMIN WITH MINERALS) TABS tablet Take 1 tablet by mouth daily.    . pantoprazole (PROTONIX) 40 MG tablet Take 1 tablet (40 mg total) by mouth 2 (two) times daily. 60 tablet 2  . sertraline (ZOLOFT) 100 MG tablet Take 1 tablet (100 mg total) by mouth daily. 90 tablet 3  . traMADol (ULTRAM) 50 MG tablet TAKE 1 TABLET BY MOUTH EVERY 6 HOURS AS NEEDED FOR PAIN 120 tablet 2  . XALATAN 0.005 % ophthalmic solution Place 1 drop into the right eye at bedtime.     Marland Kitchen zolpidem (AMBIEN) 10 MG tablet TAKE 1 TABLET BY MOUTH EVERYDAY AT BEDTIME 90 tablet 0   No current facility-administered medications on file prior to visit.        ROS:  All others reviewed and negative.  Objective        PE:  BP 138/86   Pulse 83   Ht 5\' 9"  (1.753 m)   Wt 244 lb (110.7 kg)   SpO2 96%   BMI 36.03 kg/m                 Constitutional: Pt appears in NAD               HENT: Head: NCAT.                Right Ear: External ear normal.                 Left Ear: External ear normal.                Eyes: . Pupils are equal, round, and reactive to light. Conjunctivae and EOM are normal               Nose: without d/c or deformity               Neck: Neck supple. Gross normal ROM               Cardiovascular: Normal rate and regular rhythm.                 Pulmonary/Chest: Effort normal and breath sounds without rales or wheezing.                Abd:  Soft, NT, ND, + BS, no organomegaly  Neurological: Pt is alert. At baseline orientation, motor grossly intact except for 4-4+/5 motor RLE               Skin: Skin is warm. No rashes, no other new lesions, LE edema -none               Psychiatric: Pt behavior is normal without agitation   Micro: none  Cardiac tracings I have  personally interpreted today:  none  Pertinent Radiological findings (summarize): 08/04/2019 LS spine MRI IMPRESSION: 1. Severe spinal canal stenosis and right neural foraminal stenosis at L3-L4 due to combination of disc bulge and severe facet arthrosis. 2. Mild spinal canal stenosis at L2-L3. 3. Left lateral recess narrowing at L4-L5 could contribute to left L5 radiculopathy.    Lab Results  Component Value Date   WBC 5.4 05/14/2020   HGB 12.2 05/14/2020   HCT 37.6 05/14/2020   PLT 243 05/14/2020   GLUCOSE 91 05/14/2020   CHOL 171 07/08/2019   TRIG 76.0 07/08/2019   HDL 69.10 07/08/2019   LDLDIRECT 109.9 04/03/2012   LDLCALC 86 07/08/2019   ALT 13 05/14/2020   AST 15 05/14/2020   NA 141 05/14/2020   K 3.9 05/14/2020   CL 104 05/14/2020   CREATININE 0.94 05/14/2020   BUN 17 05/14/2020   CO2 28 05/14/2020   TSH 1.23 07/08/2019   INR 1.0 06/29/2008   HGBA1C 6.0 (H) 08/05/2019   Assessment/Plan:  Pamela [name] is a 65 y.o. Black or African American [2] female with  has a past medical history of ALLERGIC RHINITIS (12/18/2006), Anxiety, Arthritis, DEPRESSION, SITUATIONAL (03/15/2007), GERD (gastroesophageal reflux disease), Headache, History of kidney stones, HYPERLIPIDEMIA (12/18/2006), Hypertension, HYPERTENSION (12/18/2006), INSOMNIA-SLEEP DISORDER-UNSPEC (08/25/2009), Pain in knee region after total knee replacement ([place]) (04/03/2012), Pneumonia, and RENAL CALCULUS, HX OF (12/18/2006).  Spinal stenosis of lumbar region With recent worsening pain, had remote fall, ok for add prn flexeril, also toradol 30 mg IM,  but also check plain films related to the fall r/o fx, but given the RLE worsening change will need NS referral  Essential hypertension BP Readings from Last 3 Encounters:  05/14/20 124/81  05/11/20 138/86  01/29/20 122/84   Stable, pt to continue medical treatment hct, losartan   Hyperglycemia . Lab Results  Component Value Date   HGBA1C 6.0 (H) 08/05/2019    Stable, pt to continue current medical treatment - diet and wt control   Followup: Return in about 6 months (around 11/11/2020).  Cathlean Cower, MD 05/20/2020 12:31 AM [facility]

## 2020-05-11 NOTE — Patient Instructions (Signed)
You had the toradol 30 mg shot today for pain  Please take all new medication as prescribed - the muscle relaxer as needed  Please continue all other medications as before, and refills have been done if requested.  Please have the pharmacy call with any other refills you may need.  Please keep your appointments with your specialists as you may have planned  You will be contacted regarding the referral for: Neurosurgury  Please go to the XRAY Department in the first floor for the x-ray testing - the lower back xrays after the fall 3 mo ago  You will be contacted by phone if any changes need to be made immediately.  Otherwise, you will receive a letter about your results with an explanation, but please check with MyChart first.  Please remember to sign up for MyChart if you have not done so, as this will be important to you in the future with finding out test results, communicating by private email, and scheduling acute appointments online when needed.

## 2020-05-12 ENCOUNTER — Encounter: Payer: Self-pay | Admitting: Internal Medicine

## 2020-05-13 ENCOUNTER — Ambulatory Visit: Payer: Medicare HMO | Admitting: Physician Assistant

## 2020-05-13 DIAGNOSIS — Z96652 Presence of left artificial knee joint: Secondary | ICD-10-CM

## 2020-05-13 DIAGNOSIS — M5136 Other intervertebral disc degeneration, lumbar region: Secondary | ICD-10-CM

## 2020-05-13 DIAGNOSIS — Z8669 Personal history of other diseases of the nervous system and sense organs: Secondary | ICD-10-CM

## 2020-05-13 DIAGNOSIS — M1711 Unilateral primary osteoarthritis, right knee: Secondary | ICD-10-CM

## 2020-05-13 DIAGNOSIS — M0579 Rheumatoid arthritis with rheumatoid factor of multiple sites without organ or systems involvement: Secondary | ICD-10-CM

## 2020-05-13 DIAGNOSIS — Z8719 Personal history of other diseases of the digestive system: Secondary | ICD-10-CM

## 2020-05-13 DIAGNOSIS — Z79899 Other long term (current) drug therapy: Secondary | ICD-10-CM

## 2020-05-13 DIAGNOSIS — S46012D Strain of muscle(s) and tendon(s) of the rotator cuff of left shoulder, subsequent encounter: Secondary | ICD-10-CM

## 2020-05-13 DIAGNOSIS — E559 Vitamin D deficiency, unspecified: Secondary | ICD-10-CM

## 2020-05-13 DIAGNOSIS — R808 Other proteinuria: Secondary | ICD-10-CM

## 2020-05-13 DIAGNOSIS — Z8639 Personal history of other endocrine, nutritional and metabolic disease: Secondary | ICD-10-CM

## 2020-05-13 DIAGNOSIS — I10 Essential (primary) hypertension: Secondary | ICD-10-CM

## 2020-05-13 DIAGNOSIS — F32A Depression, unspecified: Secondary | ICD-10-CM

## 2020-05-13 DIAGNOSIS — Z13820 Encounter for screening for osteoporosis: Secondary | ICD-10-CM

## 2020-05-13 NOTE — Progress Notes (Signed)
Office Visit Note  Patient: Pamela [name]             Date of Birth: July 07, 1955           MRN: [id number]             PCP: Biagio Borg, MD Referring: Biagio Borg, MD Visit Date: 05/14/2020 Occupation: @GUAROCC @  Subjective:  Lower back pain   History of Present Illness: Pamela [name] is a 65 y.o. female with history of rheumatoid arthritis.  She is currently on Humira 40 mg sq injections every 14 days, MTX 5 tablets by mouth once weekly, and folic acid 1 mg by mouth daily. She is tolerating both medications and has not missed any doses recently.  She denies any recent rheumatoid arthritis flares.  She states over the past 2 weeks she has been experiencing increased lower back pain.  She states that the pain is radiating down her right leg.  She denies any injury or fall prior to the onset of symptoms.  She was evaluated by her PCP on 05/11/2020 at which time she had x-rays of the lumbar spine.  She was referred to neurosurgery but has not been called to schedule an appointment yet.  She denies any other joint pain or joint swelling at this time.  She has not had any recent infections.    Activities of Daily Living:  Patient reports morning stiffness for all day. Patient Reports nocturnal pain.  Difficulty dressing/grooming: Denies Difficulty climbing stairs: Reports Difficulty getting out of chair: Reports Difficulty using hands for taps, buttons, cutlery, and/or writing: Denies  Review of Systems  Constitutional: Positive for fatigue.  HENT: Positive for mouth dryness. Negative for mouth sores and nose dryness.   Eyes: Negative for pain, itching and dryness.  Respiratory: Negative for shortness of breath and difficulty breathing.   Cardiovascular: Negative for chest pain and palpitations.  Gastrointestinal: Negative for blood in stool, constipation and diarrhea.  Endocrine: Negative for increased urination.  Genitourinary: Negative for difficulty urinating.  Musculoskeletal:  Positive for arthralgias, joint pain, joint swelling, myalgias, morning stiffness, muscle tenderness and myalgias.  Skin: Negative for color change, rash and redness.  Allergic/Immunologic: Negative for susceptible to infections.  Neurological: Negative for dizziness, numbness, headaches, memory loss and weakness.  Hematological: Positive for bruising/bleeding tendency.  Psychiatric/Behavioral: Negative for confusion.    PMFS History:  Patient Active Problem List   Diagnosis Date Noted  . Spinal stenosis of lumbar region 08/05/2019  . Inability to walk 08/05/2019  . Hives 07/08/2019  . History of glaucoma 04/24/2019  . Bursitis of left hip 12/25/2018  . MGUS (monoclonal gammopathy of unknown significance) 05/11/2018  . Rheumatoid arthritis involving multiple sites with positive rheumatoid factor ([facility]) 05/03/2018  . High risk medication use 05/03/2018  . Status post total left knee replacement 05/03/2018  . DDD (degenerative disc disease), lumbar 05/03/2018  . Primary osteoarthritis of right knee 04/05/2018  . 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 Left rotator cuff tear 12/19/2017  . Left wrist pain 12/12/2017  . Left shoulder pain 08/22/2017  . Acute pain of right knee 04/17/2017  . Pain of left calf 04/13/2017  . 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 Hyperglycemia 09/23/2015  . Anxiety state 09/23/2015  . Abnormal urine odor 08/25/2015  . GERD without esophagitis 08/25/2015  . Upper abdominal pain 04/16/2015  . Early satiety 04/16/2015  . Dysuria 04/16/2015  . Encounter for well adult exam with abnormal findings 06/18/2014  .  Greater trochanteric bursitis of right hip 01/06/2013  . Left lumbar radiculitis 04/03/2012  . Eczema 04/03/2012  . 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 BURSITIS, RIGHT HIP 08/25/2009  . INSOMNIA-SLEEP DISORDER-UNSPEC 08/25/2009  . FATIGUE 08/25/2009  . Cervicalgia 08/14/2007  . Depression 03/15/2007  . HLD (hyperlipidemia) 12/18/2006  . Essential hypertension  12/18/2006  . Allergic rhinitis 12/18/2006  . RENAL CALCULUS, HX OF 12/18/2006    Past Medical History:  Diagnosis Date  . ALLERGIC RHINITIS 12/18/2006   Qualifier: Diagnosis of  By: [place], [place]    . Anxiety   . Arthritis   . DEPRESSION, SITUATIONAL 03/15/2007   Qualifier: Diagnosis of  By: Wynona Luna   . GERD (gastroesophageal reflux disease)   . Headache    occasional  . History of kidney stones   . HYPERLIPIDEMIA 12/18/2006   Qualifier: History of  By: Danny Lawless CMA, [place]    . Hypertension   . HYPERTENSION 12/18/2006   Qualifier: Diagnosis of  By: Danny Lawless CMA, [place]    . INSOMNIA-SLEEP DISORDER-UNSPEC 08/25/2009   Qualifier: Diagnosis of  By: Jenny Reichmann MD, [place]   . Pain in knee region after total knee replacement ([facility]) 04/03/2012  . Pneumonia   . RENAL CALCULUS, HX OF 12/18/2006   Qualifier: Diagnosis of  By: Danny Lawless CMA, [place]      Family History  Problem Relation Age of Onset  . Stroke Mother   . Heart disease Father   . Lung cancer Sister   . Lung cancer Brother   . Healthy Daughter   . Healthy Daughter   . Healthy Daughter   . Colon cancer Neg Hx   . Esophageal cancer Neg Hx   . Pancreatic cancer Neg Hx   . Stomach cancer Neg Hx    Past Surgical History:  Procedure Laterality Date  . CERVICAL DISC SURGERY     x 3  . CONVERSION TO TOTAL KNEE Left 10/09/2016   Procedure: Conversion left uni compartment arthroplasty to total knee arthroplasty;  Surgeon: Paralee Cancel, MD;  Location: [facility];  Service: Orthopedics;  Laterality: Left;  90 mins  . KNEE ARTHROSCOPY     partial  . total left knee arthoplasty     10/09/16 Dr. Alvan Dame   Social History   Social History Narrative  . Not on file   Immunization History  Administered Date(s) Administered  . Fluad Quad(high Dose 65+) 04/16/2020  . Influenza Whole 11/12/2012  . Influenza,inj,Quad PF,6+ Mos 11/29/2016, 12/11/2017, 12/25/2018  . Influenza-Unspecified 12/15/2013, 12/12/2015, 11/29/2016  . Td  11/03/2002  . Tdap 09/23/2015  . Zoster 08/12/2012  . Zoster Recombinat (Shingrix) 03/22/2018, 08/20/2018     Objective: Vital Signs: BP 124/81 (BP Location: Left Arm, Patient Position: Sitting, Cuff Size: Large)   Pulse 91   Resp 16   Ht 5\' 9"  (1.753 m)   Wt 246 lb (111.6 kg)   BMI 36.33 kg/m    Physical Exam Vitals and nursing note reviewed.  Constitutional:      Appearance: She is well-developed and well-nourished.  HENT:     Head: Normocephalic and atraumatic.  Eyes:     Extraocular Movements: EOM normal.     Conjunctiva/sclera: Conjunctivae normal.  Cardiovascular:     Pulses: Intact distal pulses.  Pulmonary:     Effort: Pulmonary effort is normal.  Abdominal:     Palpations: Abdomen is soft.  Musculoskeletal:     Cervical back: Normal range of motion.  Skin:    General: Skin is  warm and dry.     Capillary Refill: Capillary refill takes less than 2 seconds.  Neurological:     Mental Status: She is alert and oriented to person, place, and time.  Psychiatric:        Mood and Affect: Mood and affect normal.        Behavior: Behavior normal.      Musculoskeletal Exam: C-spine has good range of motion with no discomfort.  Discomfort and stiffness of the lumbar spine.  No midline spinal tenderness or SI joint tenderness.  Shoulder joints have good range of motion with some discomfort and stiffness in the left shoulder.  Elbow joints, wrist joints, MCPs, PIPs, DIPs have good range of motion with no synovitis.  She was able to make a complete fist bilaterally.  Hip joints have good range of motion with no discomfort.  Left knee replacement has good range of motion with no discomfort.  Right knee has good range of motion with no warmth or effusion.  Ankle joints have good range of motion with no tenderness or inflammation.  No calf tightness or tenderness noted.  CDAI Exam: CDAI Score: 0.4  Patient Global: 2 mm; Provider Global: 2 mm Swollen: 0 ; Tender: 0  Joint Exam  05/14/2020   No joint exam has been documented for this visit   There is currently no information documented on the homunculus. Go to the Rheumatology activity and complete the homunculus joint exam.  Investigation: No additional findings.  Imaging: DG Lumbar Spine Complete  Result Date: 05/12/2020 CLINICAL DATA:  Persistent pain after a fall 3 months ago, initial encounter. Pain radiates to the right hip and leg. EXAM: LUMBAR SPINE - COMPLETE 4+ VIEW COMPARISON:  MR lumbar spine 08/05/2019 and lumbar spine series 08/04/2019. FINDINGS: Levoconvex rotatory scoliosis, as before. Numbering system utilized on 08/05/2019 is preserved. Alignment is anatomic. Vertebral body height is maintained. Mild multilevel endplate degenerative changes. Loss of disc space height at L3-4. Facet hypertrophy in the lower lumbar spine. No definite pars defects. IMPRESSION: 1. No acute findings. 2. Mild multilevel degenerative disc disease with disc space narrowing at L3-4. 3. Facet hypertrophy in the lower lumbar spine. 4. Levoconvex rotatory scoliosis. Electronically Signed   By: Lorin Picket M.D.   On: 05/12/2020 14:34    Recent Labs: Lab Results  Component Value Date   WBC 8.2 01/13/2020   HGB 12.9 01/13/2020   PLT 251 01/13/2020   NA 140 01/13/2020   K 3.5 01/13/2020   CL 103 01/13/2020   CO2 29 01/13/2020   GLUCOSE 106 (H) 01/13/2020   BUN 10 01/13/2020   CREATININE 0.81 01/13/2020   BILITOT 0.6 01/13/2020   ALKPHOS 72 12/09/2019   AST 15 01/13/2020   ALT 16 01/13/2020   PROT 7.4 01/13/2020   ALBUMIN 4.0 12/09/2019   CALCIUM 10.0 01/13/2020   GFRAA 88 01/13/2020   QFTBGOLDPLUS NEGATIVE 11/10/2019    Speciality Comments: PLQ Eye Exam: 03/26/2019 WNL @ [facility] M [facility] OD PA FOllow up in 1 year  Procedures:  No procedures performed Allergies: Cymbalta [duloxetine hcl]   Assessment / Plan:     Visit Diagnoses: Rheumatoid arthritis involving multiple sites with positive rheumatoid factor  (H[facility]) - ANA 1:160 Cytoplasmic,RF 116, sed rate 22: She has no joint tenderness or synovitis on exam.  She has not had any recent rheumatoid arthritis flares.  She is clinically doing well on Humira 40 mg subcutaneous injections every 14 days, methotrexate 5 tablets by mouth once weekly,  and folic acid 1 mg by mouth daily.  She has been tolerating these medications without any side effects and has not missed any doses recently.  She presents today with increased lower back pain and right-sided radiculopathy.  We discussed the room to arthritis does not affect the lumbar spine.  Her PCP obtained x-rays on 05/11/2020 and she was referred to neurosurgery for further evaluation and management.  She is not experiencing any other joint pain or joint swelling at this time.  She was advised to notify us if she develops signs or symptoms of a flare.  She will follow-up in the office in 5 months.  High risk medication use - Humira 40 mg sq injections every 14 days-started 12/01/19, Methotrexate 5 tablets by mouth once weekly, and folic acid 1 mg po daily. CBC and CMP updated on 01/13/20.  She is due to update lab work.  Her next lab work will be due in June and every 3 months to monitor for drug toxicity.  Standing orders for CBC and CMP are in place. TB gold negative on 11/10/19 and will continue to be monitored yearly.  She has not had any recent infections.  She has not received the COVID-19 vaccines and does not plan to at this time. - Plan: CBC with Differential/Platelet, COMPLETE METABOLIC PANEL WITH GFR  Primary osteoarthritis of right knee: She continues to have chronic right knee joint pain.  She has good range of motion of the right knee joint on examination today with no warmth or effusion.  S/P left TK revision: Doing well.  She has good range of motion with no discomfort.  Trochanteric bursitis, left hip: Resolved.  She has no tenderness palpation on exam.  Traumatic incomplete tear of left rotator cuff,  subsequent encounter: She has some discomfort and stiffness in the left shoulder with range of motion.    DDD (degenerative disc disease), lumbar: She presents today with increased lower back pain and right-sided radiculopathy.  Her discomfort started 2 weeks ago with no injury or fall prior to the onset of symptoms.  She has been having difficulty standing for prolonged periods of time as well as rising from a seated position.  She was evaluated by her PCP on 05/11/2020 at which time she had x-rays of the lumbar spine obtained.  X-rays were consistent with mild multilevel degenerative disc disease with disc space narrowing at L3-L4, facet hypertrophy in the lower lumbar spine, and levoconvex rotatory scoliosis.  On examination today she has some discomfort with range of motion.  No midline spinal tenderness or SI joint tenderness.  No lower extremity muscle weakness was noted.  She was referred to neurosurgery by her PCP for further evaluation and management.  Other medical conditions are listed as follows:  Anxiety and depression  History of hyperlipidemia  Essential hypertension  History of glaucoma  Other proteinuria  History of gastroesophageal reflux (GERD)  Vitamin D deficiency  Orders: Orders Placed This Encounter  Procedures  . CBC with Differential/Platelet  . COMPLETE METABOLIC PANEL WITH GFR   No orders of the defined types were placed in this encounter.   Follow-Up Instructions: Return in about 5 months (around 10/14/2020) for Rheumatoid arthritis.   Ofilia Neas, PA-C  Note - This record has been created using Dragon software.  Chart creation errors have been sought, but may not always  have been located. Such creation errors do not reflect on  the standard of medical care.

## 2020-05-14 ENCOUNTER — Encounter: Payer: Self-pay | Admitting: Physician Assistant

## 2020-05-14 ENCOUNTER — Ambulatory Visit (INDEPENDENT_AMBULATORY_CARE_PROVIDER_SITE_OTHER): Payer: Medicare HMO | Admitting: Physician Assistant

## 2020-05-14 ENCOUNTER — Other Ambulatory Visit: Payer: Self-pay

## 2020-05-14 VITALS — BP 124/81 | HR 91 | Resp 16 | Ht 69.0 in | Wt 246.0 lb

## 2020-05-14 DIAGNOSIS — M1711 Unilateral primary osteoarthritis, right knee: Secondary | ICD-10-CM

## 2020-05-14 DIAGNOSIS — R808 Other proteinuria: Secondary | ICD-10-CM

## 2020-05-14 DIAGNOSIS — M0579 Rheumatoid arthritis with rheumatoid factor of multiple sites without organ or systems involvement: Secondary | ICD-10-CM | POA: Diagnosis not present

## 2020-05-14 DIAGNOSIS — M7062 Trochanteric bursitis, left hip: Secondary | ICD-10-CM

## 2020-05-14 DIAGNOSIS — I10 Essential (primary) hypertension: Secondary | ICD-10-CM

## 2020-05-14 DIAGNOSIS — F32A Depression, unspecified: Secondary | ICD-10-CM

## 2020-05-14 DIAGNOSIS — Z8719 Personal history of other diseases of the digestive system: Secondary | ICD-10-CM

## 2020-05-14 DIAGNOSIS — Z96652 Presence of left artificial knee joint: Secondary | ICD-10-CM | POA: Diagnosis not present

## 2020-05-14 DIAGNOSIS — Z8639 Personal history of other endocrine, nutritional and metabolic disease: Secondary | ICD-10-CM | POA: Diagnosis not present

## 2020-05-14 DIAGNOSIS — S46012D Strain of muscle(s) and tendon(s) of the rotator cuff of left shoulder, subsequent encounter: Secondary | ICD-10-CM

## 2020-05-14 DIAGNOSIS — Z79899 Other long term (current) drug therapy: Secondary | ICD-10-CM

## 2020-05-14 DIAGNOSIS — E559 Vitamin D deficiency, unspecified: Secondary | ICD-10-CM

## 2020-05-14 DIAGNOSIS — M5136 Other intervertebral disc degeneration, lumbar region: Secondary | ICD-10-CM

## 2020-05-14 DIAGNOSIS — F419 Anxiety disorder, unspecified: Secondary | ICD-10-CM

## 2020-05-14 DIAGNOSIS — R69 Illness, unspecified: Secondary | ICD-10-CM | POA: Diagnosis not present

## 2020-05-14 DIAGNOSIS — Z8669 Personal history of other diseases of the nervous system and sense organs: Secondary | ICD-10-CM

## 2020-05-14 NOTE — Patient Instructions (Signed)
Standing Labs We placed an order today for your standing lab work.   Please have your standing labs drawn in June and every 3 months   If possible, please have your labs drawn 2 weeks prior to your appointment so that the provider can discuss your results at your appointment.  We have open lab daily Monday through Thursday from 1:30-4:30 PM and Friday from 1:30-4:00 PM at the office of Dr. Shaili Deveshwar, [facility].   Please be advised, all patients with office appointments requiring lab work will take precedents over walk-in lab work.  If possible, please come for your lab work on Monday and Friday afternoons, as you may experience shorter wait times. The office is located at [address], [address], [place], [place] [address] No appointment is necessary.   Labs are drawn by [facility]. Please bring your co-pay at the time of your lab draw.  You may receive a bill from [facility] for your lab work.  If you wish to have your labs drawn at another location, please call the office 24 hours in advance to send orders.  If you have any questions regarding directions or hours of operation,  please call [phone].   As a reminder, please drink plenty of water prior to coming for your lab work. Thanks!   

## 2020-05-15 LAB — COMPLETE METABOLIC PANEL WITH GFR
AG Ratio: 1.6 (calc) (ref 1.0–2.5)
ALT: 13 U/L (ref 6–29)
AST: 15 U/L (ref 10–35)
Albumin: 4.2 g/dL (ref 3.6–5.1)
Alkaline phosphatase (APISO): 59 U/L (ref 37–153)
BUN: 17 mg/dL (ref 7–25)
CO2: 28 mmol/L (ref 20–32)
Calcium: 9.6 mg/dL (ref 8.6–10.4)
Chloride: 104 mmol/L (ref 98–110)
Creat: 0.94 mg/dL (ref 0.50–0.99)
GFR, Est African American: 74 mL/min/{1.73_m2} (ref >=60)
GFR, Est Non African American: 64 mL/min/{1.73_m2} (ref >=60)
Globulin: 2.6 g/dL (calc) (ref 1.9–3.7)
Glucose, Bld: 91 mg/dL (ref 65–99)
Potassium: 3.9 mmol/L (ref 3.5–5.3)
Sodium: 141 mmol/L (ref 135–146)
Total Bilirubin: 0.4 mg/dL (ref 0.2–1.2)
Total Protein: 6.8 g/dL (ref 6.1–8.1)

## 2020-05-15 LAB — CBC WITH DIFFERENTIAL/PLATELET
Absolute Monocytes: 556 cells/uL (ref 200–950)
Basophils Absolute: 81 cells/uL (ref 0–200)
Basophils Relative: 1.5 %
Eosinophils Absolute: 178 cells/uL (ref 15–500)
Eosinophils Relative: 3.3 %
HCT: 37.6 % (ref 35.0–45.0)
Hemoglobin: 12.2 g/dL (ref 11.7–15.5)
Lymphs Abs: 1161 cells/uL (ref 850–3900)
MCH: 27.8 pg (ref 27.0–33.0)
MCHC: 32.4 g/dL (ref 32.0–36.0)
MCV: 85.6 fL (ref 80.0–100.0)
MPV: 11 fL (ref 7.5–12.5)
Monocytes Relative: 10.3 %
Neutro Abs: 3424 cells/uL (ref 1500–7800)
Neutrophils Relative %: 63.4 %
Platelets: 243 10*3/uL (ref 140–400)
RBC: 4.39 10*6/uL (ref 3.80–5.10)
RDW: 13.2 % (ref 11.0–15.0)
Total Lymphocyte: 21.5 %
WBC: 5.4 10*3/uL (ref 3.8–10.8)

## 2020-05-17 NOTE — Progress Notes (Signed)
CBC and CMP WNL

## 2020-05-20 ENCOUNTER — Encounter: Payer: Self-pay | Admitting: Internal Medicine

## 2020-05-20 NOTE — Assessment & Plan Note (Signed)
BP Readings from Last 3 Encounters:  05/14/20 124/81  05/11/20 138/86  01/29/20 122/84   Stable, pt to continue medical treatment hct, losartan

## 2020-05-20 NOTE — Assessment & Plan Note (Addendum)
With recent worsening pain, had remote fall, ok for add prn flexeril, also toradol 30 mg IM,  but also check plain films related to the fall r/o fx, but given the RLE worsening change will need NS referral

## 2020-05-20 NOTE — Assessment & Plan Note (Signed)
.   Lab Results  Component Value Date   HGBA1C 6.0 (H) 08/05/2019   Stable, pt to continue current medical treatment - diet and wt control

## 2020-05-25 ENCOUNTER — Other Ambulatory Visit: Payer: Self-pay | Admitting: Internal Medicine

## 2020-05-28 ENCOUNTER — Other Ambulatory Visit: Payer: Self-pay | Admitting: *Deleted

## 2020-05-28 DIAGNOSIS — M0579 Rheumatoid arthritis with rheumatoid factor of multiple sites without organ or systems involvement: Secondary | ICD-10-CM

## 2020-05-28 MED ORDER — HUMIRA (2 PEN) 40 MG/0.4ML ~~LOC~~ AJKT
40.0000 mg | AUTO-INJECTOR | SUBCUTANEOUS | 0 refills | Status: DC
Start: 2020-05-28 — End: 2020-08-06

## 2020-05-28 NOTE — Telephone Encounter (Signed)
RX faxed from [facility]  Next Visit: 10/15/2020  Last Visit: 05/14/2020  Last Fill: 03/01/2020  WP:TYYPEJYLTE arthritis involving multiple sites with positive rheumatoid factor   Current Dose per office note 05/14/2020, Humira 40 mg sq injections every 14 days  Labs: 05/14/2020, CBC and CMP WNL  TB Gold: 11/10/2019, negative  Okay to refill Humira?

## 2020-06-01 ENCOUNTER — Telehealth: Payer: Self-pay

## 2020-06-01 NOTE — Telephone Encounter (Signed)
RX faxed on 05/28/2020, I called patient

## 2020-06-01 NOTE — Telephone Encounter (Signed)
Patient called checking the status of her prescription refill of Humira.  Patient states she is out of medication and due for her injection.  Patient requested a return call.

## 2020-06-27 ENCOUNTER — Other Ambulatory Visit: Payer: Self-pay | Admitting: Physician Assistant

## 2020-06-28 NOTE — Telephone Encounter (Signed)
Next Visit: 10/15/2020  Last Visit: 05/14/2020  Last Fill: 04/05/2020  DX:  Rheumatoid arthritis involving multiple sites with positive rheumatoid factor   Current Dose per office note 05/14/2020, Methotrexate 5 tablets by mouth once weekly  Labs: 05/14/2020, CBC and CMP WNL  Okay to refill MTX?

## 2020-06-29 ENCOUNTER — Other Ambulatory Visit: Payer: Self-pay | Admitting: Rheumatology

## 2020-06-29 ENCOUNTER — Other Ambulatory Visit: Payer: Self-pay | Admitting: Internal Medicine

## 2020-07-01 ENCOUNTER — Other Ambulatory Visit: Payer: Self-pay | Admitting: Physician Assistant

## 2020-07-05 ENCOUNTER — Other Ambulatory Visit: Payer: Self-pay | Admitting: Internal Medicine

## 2020-07-20 ENCOUNTER — Ambulatory Visit (INDEPENDENT_AMBULATORY_CARE_PROVIDER_SITE_OTHER): Payer: Medicare HMO | Admitting: Rheumatology

## 2020-07-20 ENCOUNTER — Other Ambulatory Visit: Payer: Self-pay

## 2020-07-20 VITALS — BP 134/74 | HR 97

## 2020-07-20 DIAGNOSIS — M25561 Pain in right knee: Secondary | ICD-10-CM

## 2020-07-20 DIAGNOSIS — G8929 Other chronic pain: Secondary | ICD-10-CM | POA: Diagnosis not present

## 2020-07-20 DIAGNOSIS — M1711 Unilateral primary osteoarthritis, right knee: Secondary | ICD-10-CM | POA: Diagnosis not present

## 2020-07-20 MED ORDER — LIDOCAINE HCL 1 % IJ SOLN
1.5000 mL | INTRAMUSCULAR | Status: AC | PRN
  Administered 2020-07-20: 1.5 mL

## 2020-07-20 MED ORDER — TRIAMCINOLONE ACETONIDE 40 MG/ML IJ SUSP
40.0000 mg | INTRAMUSCULAR | Status: AC | PRN
  Administered 2020-07-20: 40 mg via INTRA_ARTICULAR

## 2020-07-20 NOTE — Progress Notes (Signed)
   Procedure Note  Patient: Pamela [name]             Date of Birth: 1956-02-18           MRN: [id number]             Visit Date: 07/20/2020  Procedures: Visit Diagnoses:  1. Primary osteoarthritis of right knee   2. Chronic pain of right knee    Patient has known history of osteoarthritis of her right knee joint.  She continues to have pain and discomfort in the right knee joint.  She came today to get a cortisone injection.  Different treatment options and side effects were discussed at length.  She wants to proceed with the cortisone injection.  No warmth swelling or effusion was noted on examination.  Large Joint Inj on 07/20/2020 10:12 AM Indications: pain Details: 27 G 1.5 in needle, medial approach  Arthrogram: No  Medications: 40 mg triamcinolone acetonide 40 MG/ML; 1.5 mL lidocaine 1 % Aspirate: 0 mL Outcome: tolerated well, no immediate complications Procedure, treatment alternatives, risks and benefits explained, specific risks discussed. Consent was given by the patient. Immediately prior to procedure a time out was called to verify the correct patient, procedure, equipment, support staff and site/side marked as required. Patient was prepped and draped in the usual sterile fashion.    Patient tolerated the procedure well.  Postprocedure instructions were given.  I also discussed possible viscosupplementation junctions.  She does not want to have total knee replacement. Bo Merino, MD

## 2020-07-30 ENCOUNTER — Other Ambulatory Visit: Payer: Self-pay | Admitting: Internal Medicine

## 2020-08-05 ENCOUNTER — Other Ambulatory Visit: Payer: Self-pay | Admitting: Rheumatology

## 2020-08-05 ENCOUNTER — Other Ambulatory Visit: Payer: Self-pay | Admitting: Internal Medicine

## 2020-08-05 NOTE — Telephone Encounter (Signed)
Next Visit: 10/15/2020  Last Visit: 07/20/2020, 05/14/2020,   Last Fill: 08/04/2019  Dx:  Rheumatoid arthritis involving multiple sites with positive rheumatoid factor   Current Dose per office note on 07/19/6823, folic acid 1 mg po daily  Okay to refill folic acid?

## 2020-08-06 ENCOUNTER — Other Ambulatory Visit: Payer: Self-pay | Admitting: *Deleted

## 2020-08-06 DIAGNOSIS — M0579 Rheumatoid arthritis with rheumatoid factor of multiple sites without organ or systems involvement: Secondary | ICD-10-CM

## 2020-08-06 MED ORDER — HUMIRA (2 PEN) 40 MG/0.4ML ~~LOC~~ AJKT: 40.0000 mg | AUTO-INJECTOR | SUBCUTANEOUS | 0 refills | Status: DC

## 2020-08-06 NOTE — Telephone Encounter (Signed)
Next Visit: 10/15/2020  Last Visit: 05/14/2020  Last Fill: 05/28/2020  DX:  Rheumatoid arthritis involving multiple sites with positive rheumatoid factor   Current Dose per office note 05/14/2020, Humira 40 mg sq injections every 14 days  Labs: 05/14/2020, CBC and CMP WNL  TB Gold: 11/10/2019, negative  Okay to refill Humira?

## 2020-09-14 ENCOUNTER — Telehealth: Payer: Self-pay

## 2020-09-14 DIAGNOSIS — Z9225 Personal history of immunosupression therapy: Secondary | ICD-10-CM

## 2020-09-14 DIAGNOSIS — Z79899 Other long term (current) drug therapy: Secondary | ICD-10-CM

## 2020-09-14 DIAGNOSIS — Z111 Encounter for screening for respiratory tuberculosis: Secondary | ICD-10-CM

## 2020-09-14 NOTE — Telephone Encounter (Signed)
Left message to advise patient she is due for labs now. Advised patient of lab hours.

## 2020-09-14 NOTE — Telephone Encounter (Signed)
Patient left a voicemail requesting a return call to let her know if she is due for labwork.

## 2020-09-18 ENCOUNTER — Other Ambulatory Visit: Payer: Self-pay | Admitting: Internal Medicine

## 2020-09-19 ENCOUNTER — Other Ambulatory Visit: Payer: Self-pay | Admitting: Physician Assistant

## 2020-09-20 NOTE — Telephone Encounter (Signed)
Next Visit: 10/15/2020   Last Visit: 05/14/2020   Last Fill: 06/28/2020  DX:  Rheumatoid arthritis involving multiple sites with positive rheumatoid factor    Current Dose per office note 05/14/2020,   Labs: 05/14/2020, CBC and CMP WNL  Patient aware she is due for labs now.   Okay to refill MTX?

## 2020-09-22 IMAGING — DX DG SHOULDER 2+V*L*
3 series · 3 of 3 positions shown · non-contrast
Comparison: None.

CLINICAL DATA: Fell 1 month ago with persistent generalized
shoulder pain.

EXAM:
LEFT SHOULDER - 2+ VIEW

[grashey]
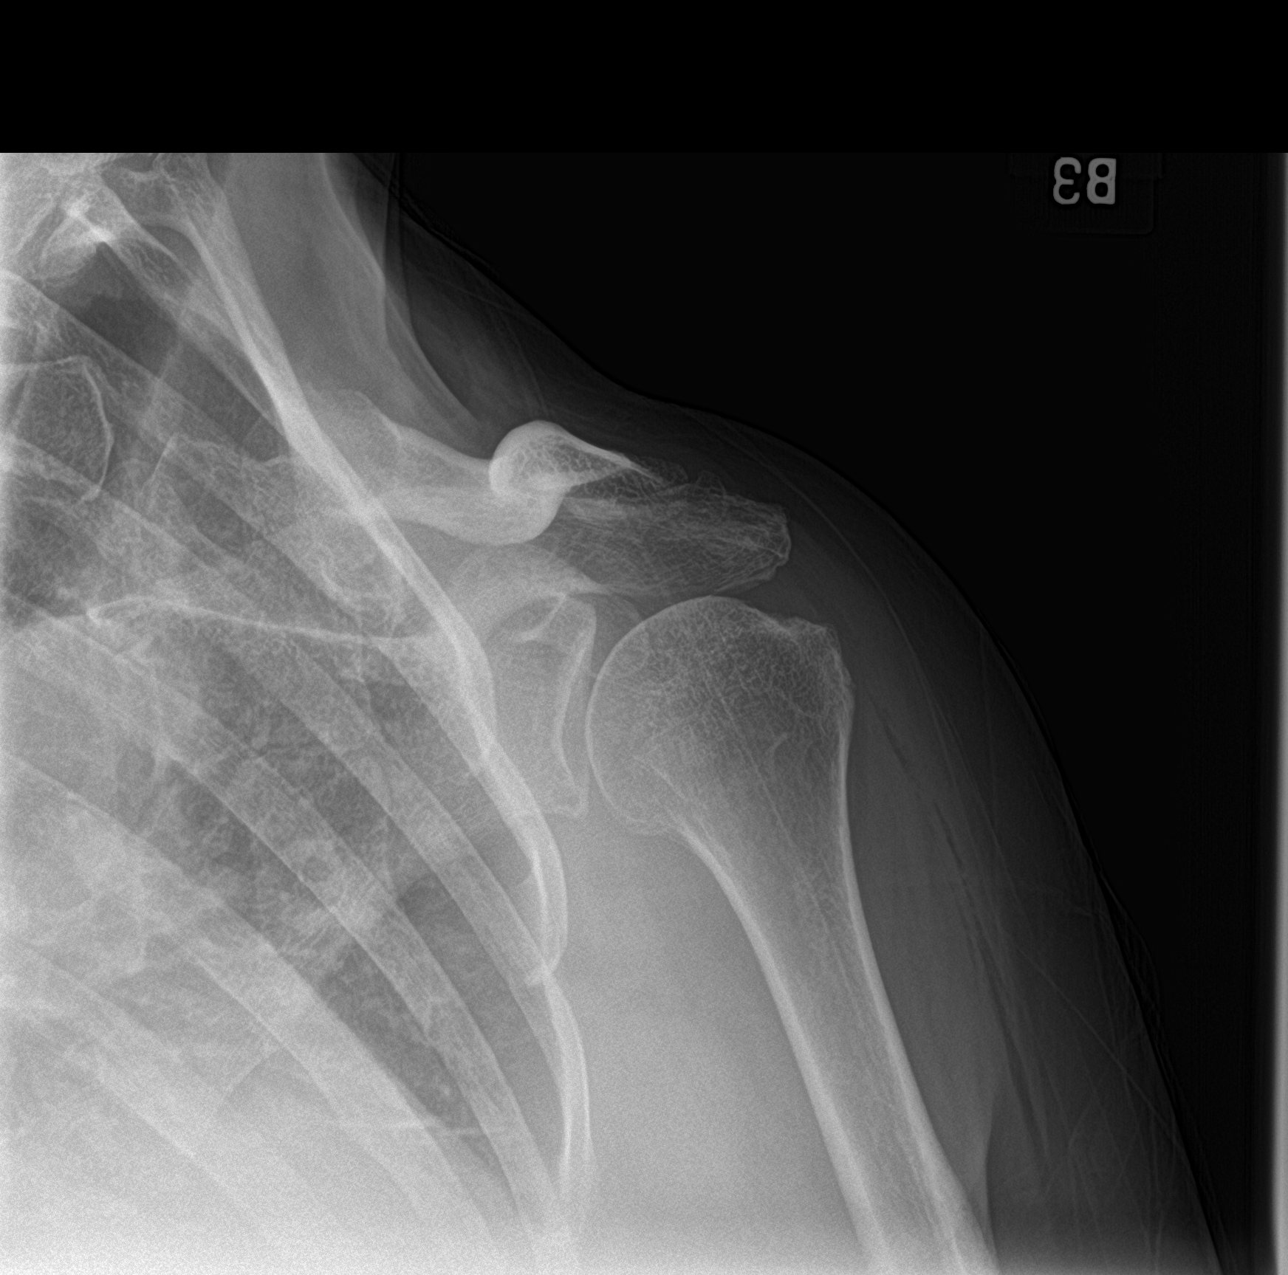

[y view]
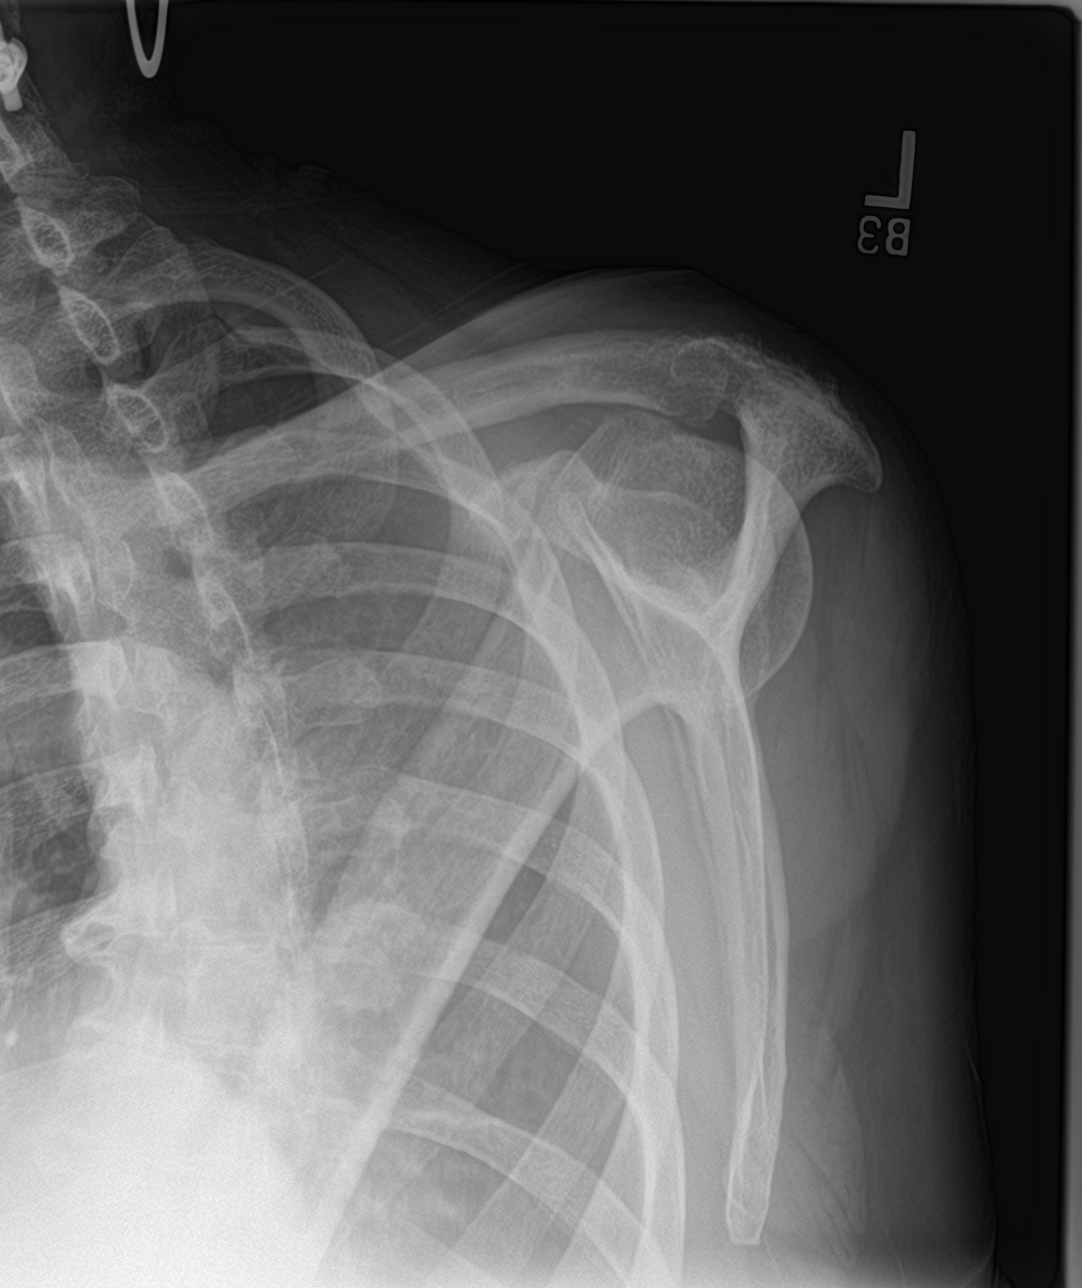

[shoulder axial]
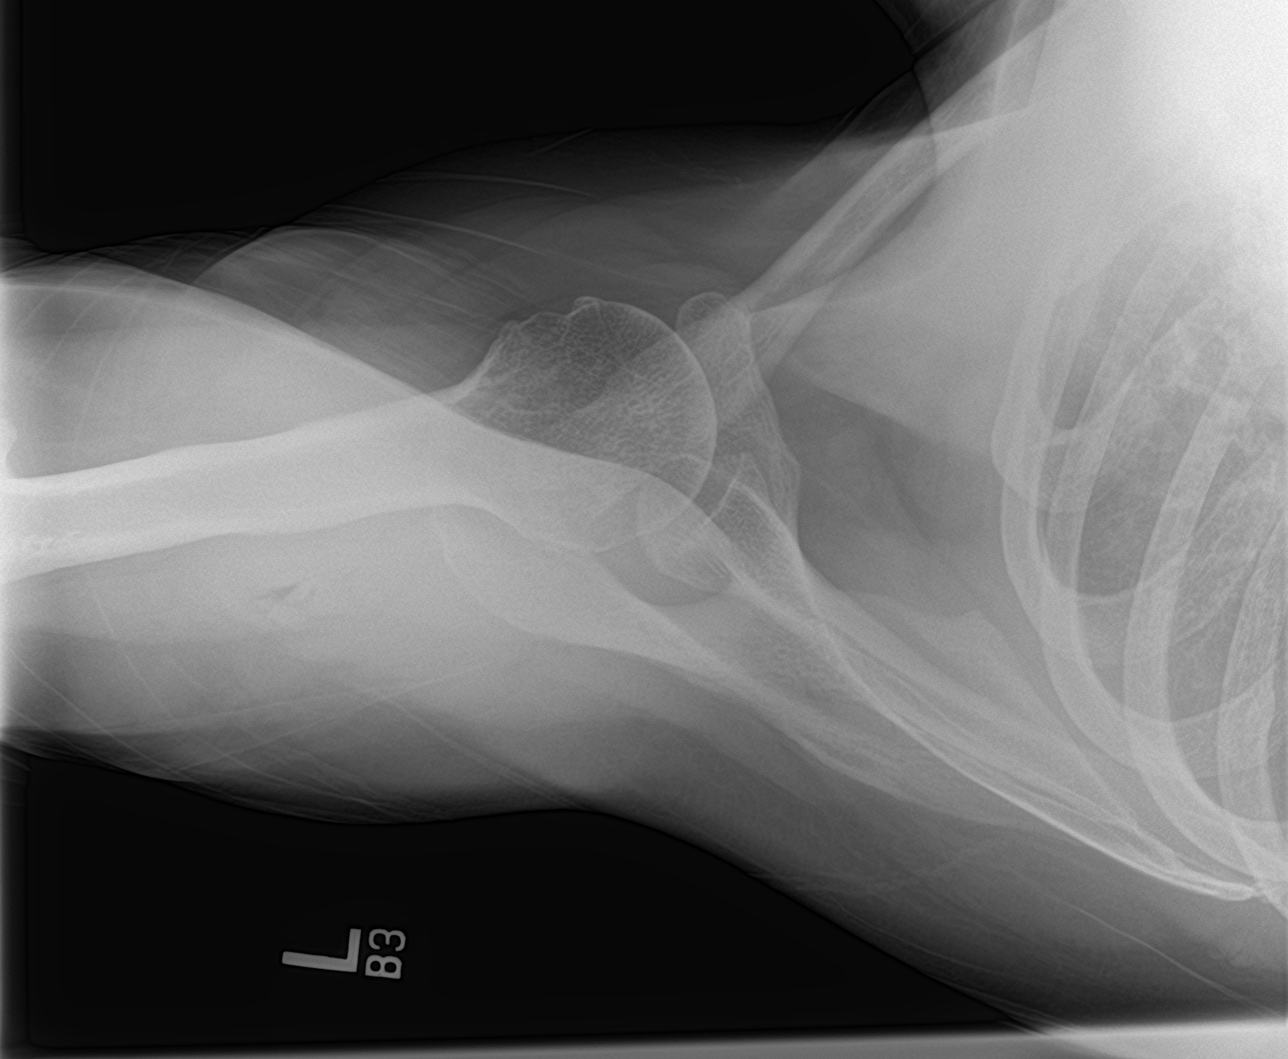

[3 of 3 positions shown; findings below may reference images not displayed]

FINDINGS: The bones are subjectively adequately mineralized. The joint spaces
are reasonably well-maintained. There is no acute fracture nor
dislocation. The observed portions of the left clavicle and upper
left ribs are normal.
IMPRESSION: There is no acute or healing fracture of the left shoulder.

## 2020-09-29 ENCOUNTER — Telehealth: Payer: Self-pay | Admitting: Rheumatology

## 2020-09-29 NOTE — Telephone Encounter (Signed)
Patient calling to find out when she is due for her next lab draw? Please call to advise.

## 2020-09-29 NOTE — Telephone Encounter (Signed)
Patient advised she is due for her next labs now. Patient advised she does not have to fast.

## 2020-09-30 ENCOUNTER — Other Ambulatory Visit: Payer: Self-pay

## 2020-09-30 DIAGNOSIS — Z79899 Other long term (current) drug therapy: Secondary | ICD-10-CM | POA: Diagnosis not present

## 2020-10-01 LAB — CBC WITH DIFFERENTIAL/PLATELET
Absolute Monocytes: 704 cells/uL (ref 200–950)
Basophils Absolute: 69 cells/uL (ref 0–200)
Basophils Relative: 1 %
Eosinophils Absolute: 200 cells/uL (ref 15–500)
Eosinophils Relative: 2.9 %
HCT: 37.1 % (ref 35.0–45.0)
Hemoglobin: 12.3 g/dL (ref 11.7–15.5)
Lymphs Abs: 1415 cells/uL (ref 850–3900)
MCH: 28.7 pg (ref 27.0–33.0)
MCHC: 33.2 g/dL (ref 32.0–36.0)
MCV: 86.7 fL (ref 80.0–100.0)
MPV: 10.4 fL (ref 7.5–12.5)
Monocytes Relative: 10.2 %
Neutro Abs: 4513 cells/uL (ref 1500–7800)
Neutrophils Relative %: 65.4 %
Platelets: 254 10*3/uL (ref 140–400)
RBC: 4.28 10*6/uL (ref 3.80–5.10)
RDW: 13.2 % (ref 11.0–15.0)
Total Lymphocyte: 20.5 %
WBC: 6.9 10*3/uL (ref 3.8–10.8)

## 2020-10-01 LAB — COMPLETE METABOLIC PANEL WITH GFR
AG Ratio: 1.6 (calc) (ref 1.0–2.5)
ALT: 15 U/L (ref 6–29)
AST: 15 U/L (ref 10–35)
Albumin: 4.1 g/dL (ref 3.6–5.1)
Alkaline phosphatase (APISO): 51 U/L (ref 37–153)
BUN: 15 mg/dL (ref 7–25)
CO2: 25 mmol/L (ref 20–32)
Calcium: 9.3 mg/dL (ref 8.6–10.4)
Chloride: 103 mmol/L (ref 98–110)
Creat: 0.92 mg/dL (ref 0.50–1.05)
Globulin: 2.6 g/dL (calc) (ref 1.9–3.7)
Glucose, Bld: 131 mg/dL — ABNORMAL HIGH (ref 65–99)
Potassium: 3.5 mmol/L (ref 3.5–5.3)
Sodium: 140 mmol/L (ref 135–146)
Total Bilirubin: 0.4 mg/dL (ref 0.2–1.2)
Total Protein: 6.7 g/dL (ref 6.1–8.1)
eGFR: 69 mL/min/{1.73_m2} (ref >=60)

## 2020-10-01 NOTE — Progress Notes (Signed)
CBC and CMP normal.  Glucose mildly elevated, probably not fasting sample.

## 2020-10-01 NOTE — Progress Notes (Signed)
Office Visit Note  Patient: Pamela [name]             Date of Birth: 05-15-1955           MRN: [id number]             PCP: Biagio Borg, MD Referring: Biagio Borg, MD Visit Date: 10/15/2020 Occupation: '@GUAROCC'$ @  Subjective:  Arthritis ([place])   History of Present Illness: Pamela [name] is a 65 y.o. female with history of rheumatoid arthritis and osteoarthritis.  She states she continues to have pain and discomfort in her bilateral knee joints.  The left trochanteric bursitis is better.  None of the other joints are painful.  She denies any joint swelling.  She continues to have some lower back pain.  Activities of Daily Living:  Patient reports morning stiffness for 24 hours.   Patient Denies nocturnal pain.  Difficulty dressing/grooming: Denies Difficulty climbing stairs: Reports Difficulty getting out of chair: Denies Difficulty using hands for taps, buttons, cutlery, and/or writing: Denies  Review of Systems  Constitutional:  Positive for fatigue.  HENT:  Negative for mouth dryness.   Eyes:  Negative for dryness.  Respiratory:  Negative for shortness of breath.   Cardiovascular:  Negative for swelling in legs/feet.  Gastrointestinal:  Negative for constipation.  Endocrine: Positive for heat intolerance.  Genitourinary:  Negative for difficulty urinating.  Musculoskeletal:  Positive for joint pain, joint pain, joint swelling, muscle weakness, morning stiffness and muscle tenderness.  Skin:  Negative for rash.  Allergic/Immunologic: Negative for susceptible to infections.  Neurological:  Positive for weakness.  Hematological:  Negative for bruising/bleeding tendency.  Psychiatric/Behavioral:  Positive for sleep disturbance.    PMFS History:  Patient Active Problem List   Diagnosis Date Noted   Spinal stenosis of lumbar region 08/05/2019   Inability to walk 08/05/2019   Hives 07/08/2019   History of glaucoma 04/24/2019   Bursitis of left hip 12/25/2018   MGUS  (monoclonal gammopathy of unknown significance) 05/11/2018   Rheumatoid arthritis involving multiple sites with positive rheumatoid factor ([place]) 05/03/2018   High risk medication use 05/03/2018   Status post total left knee replacement 05/03/2018   DDD (degenerative disc disease), lumbar 05/03/2018   Primary osteoarthritis of right knee 04/05/2018   Arthritis of left wrist 12/19/2017   Left rotator cuff tear 12/19/2017   Left wrist pain 12/12/2017   Left shoulder pain 08/22/2017   Acute pain of right knee 04/17/2017   Pain of left calf 04/13/2017   S/P left TK revision 10/09/2016   Hyperglycemia 09/23/2015   Anxiety state 09/23/2015   Abnormal urine odor 08/25/2015   GERD without esophagitis 08/25/2015   Upper abdominal pain 04/16/2015   Early satiety 04/16/2015   Dysuria 04/16/2015   Encounter for well adult exam with abnormal findings 06/18/2014   Greater trochanteric bursitis of right hip 01/06/2013   Left lumbar radiculitis 04/03/2012   Eczema 04/03/2012   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   BURSITIS, RIGHT HIP 08/25/2009   INSOMNIA-SLEEP DISORDER-UNSPEC 08/25/2009   FATIGUE 08/25/2009   Cervicalgia 08/14/2007   Depression 03/15/2007   HLD (hyperlipidemia) 12/18/2006   Essential hypertension 12/18/2006   Allergic rhinitis 12/18/2006   RENAL CALCULUS, HX OF 12/18/2006    Past Medical History:  Diagnosis Date   ALLERGIC RHINITIS 12/18/2006   Qualifier: Diagnosis of  By: Danny Lawless CMA, [place]     Anxiety    Arthritis    DEPRESSION, SITUATIONAL 03/15/2007   Qualifier: Diagnosis of  By:  Wynona Luna    GERD (gastroesophageal reflux disease)    Headache    occasional   History of kidney stones    HYPERLIPIDEMIA 12/18/2006   Qualifier: History of  By: [place], [place]     Hypertension    HYPERTENSION 12/18/2006   Qualifier: Diagnosis of  By: [place], [place]     INSOMNIA-SLEEP [place] 08/25/2009   Qualifier: Diagnosis of  By: Jenny Reichmann MD, [place]    Pain in knee  region after total knee replacement ([place]) 04/03/2012   Pneumonia    RENAL CALCULUS, HX OF 12/18/2006   Qualifier: Diagnosis of  By: Danny Lawless CMA, [place]      Family History  Problem Relation Age of Onset   Stroke Mother    Heart disease Father    Lung cancer Sister    Lung cancer Brother    Healthy Daughter    Healthy Daughter    Healthy Daughter    Colon cancer Neg Hx    Esophageal cancer Neg Hx    Pancreatic cancer Neg Hx    Stomach cancer Neg Hx    Past Surgical History:  Procedure Laterality Date   CERVICAL DISC SURGERY     x 3   CONVERSION TO TOTAL KNEE Left 10/09/2016   Procedure: Conversion left uni compartment arthroplasty to total knee arthroplasty;  Surgeon: Paralee Cancel, MD;  Location: [facility];  Service: Orthopedics;  Laterality: Left;  90 mins   KNEE ARTHROSCOPY     partial   total left knee arthoplasty     10/09/16 Dr. Alvan Dame   Social History   Social History Narrative   Not on file   Immunization History  Administered Date(s) Administered   Fluad Quad(high Dose 65+) 04/16/2020   Influenza Whole 11/12/2012   Influenza,inj,Quad PF,6+ Mos 11/29/2016, 12/11/2017, 12/25/2018   Influenza-Unspecified 12/15/2013, 12/12/2015, 11/29/2016   Td 11/03/2002   Tdap 09/23/2015   Zoster Recombinat (Shingrix) 03/22/2018, 08/20/2018   Zoster, Live 08/12/2012     Objective: Vital Signs: BP 127/80 (BP Location: Left Arm, Patient Position: Sitting, Cuff Size: Normal)   Pulse 73   Resp 16   Ht '5\' 9"'$  (1.753 m)   Wt 248 lb 9.6 oz (112.8 kg)   BMI 36.71 kg/m    Physical Exam Vitals and nursing note reviewed.  Constitutional:      Appearance: She is well-developed.  HENT:     Head: Normocephalic and atraumatic.  Eyes:     Conjunctiva/sclera: Conjunctivae normal.  Cardiovascular:     Rate and Rhythm: Normal rate and regular rhythm.     Heart sounds: Normal heart sounds.  Pulmonary:     Effort: Pulmonary effort is normal.     Breath sounds: Normal breath sounds.   Abdominal:     General: Bowel sounds are normal.     Palpations: Abdomen is soft.  Musculoskeletal:     Cervical back: Normal range of motion.  Lymphadenopathy:     Cervical: No cervical adenopathy.  Skin:    General: Skin is warm and dry.     Capillary Refill: Capillary refill takes less than 2 seconds.  Neurological:     Mental Status: She is alert and oriented to person, place, and time.  Psychiatric:        Behavior: Behavior normal.     Musculoskeletal Exam: C-spine was in good range of motion.  Shoulder joints, elbow joints, wrist joints with good range of motion.  She had no synovitis of her MCPs  PIPs or DIPs.  Some PIP and DIP thickening was noted.  Hip joints with good range of motion.  Her left knee joint is replaced which was in good range of motion.  She has some discomfort range of motion of her right knee joint.  There was no tenderness over ankles or MTPs.  CDAI Exam: CDAI Score: 2.4  Patient Global: 2 mm; Provider Global: 2 mm Swollen: 0 ; Tender: 2  Joint Exam 10/15/2020      Right  Left  Knee   Tender   Tender     Investigation: No additional findings.  Imaging: No results found.  Recent Labs: Lab Results  Component Value Date   WBC 6.9 09/30/2020   HGB 12.3 09/30/2020   PLT 254 09/30/2020   NA 140 09/30/2020   K 3.5 09/30/2020   CL 103 09/30/2020   CO2 25 09/30/2020   GLUCOSE 131 (H) 09/30/2020   BUN 15 09/30/2020   CREATININE 0.92 09/30/2020   BILITOT 0.4 09/30/2020   ALKPHOS 72 12/09/2019   AST 15 09/30/2020   ALT 15 09/30/2020   PROT 6.7 09/30/2020   ALBUMIN 4.0 12/09/2019   CALCIUM 9.3 09/30/2020   GFRAA 74 05/14/2020   QFTBGOLDPLUS NEGATIVE 11/10/2019    Speciality Comments: PLQ Eye Exam: 03/26/2019 WNL @ [facility] [place] OD PA FOllow up in 1 year  Procedures:  No procedures performed Allergies: Cymbalta [duloxetine hcl]   Assessment / Plan:     Visit Diagnoses: Rheumatoid arthritis involving multiple sites with positive  rheumatoid factor (HCC) - ANA 1:160 Cytoplasmic,RF 116, sed rate 22: - Plan: Sedimentation rate.  She continues to have pain and discomfort in her joints.  No synovitis was noted on the examination.  She is tolerating Humira and methotrexate well.  High risk medication use - Humira 40 mg sq injections every 14 days-started 12/01/19, Methotrexate 5 tablets by mouth once weekly, and folic acid 1 mg po daily.  Labs from July 2022 were within normal limits.  We will check labs every 3 months to monitor for drug toxicity.  TB Gold will be obtained today.- Plan: QuantiFERON-TB Gold Plus.  She does not want to get COVID-19 vaccine.  Instructions regarding other immunization were placed in the AVS.  She was advised to get annual skin examination to screen for nonmelanoma skin cancer while she is on Humira.  She was also advised to stop Humira and methotrexate in case she develops an infection and resume medications once infection resolves.  Primary osteoarthritis of right knee-she continues to have pain and discomfort in her right knee joint.  She is on tramadol and gabapentin.  She has an appointment coming up with the orthopedic surgeon.  S/P left TK revision-doing better after the revision.  Trochanteric bursitis, left hip -she has intermittent discomfort.  Traumatic incomplete tear of left rotator cuff, subsequent encounter-she had good range of motion.  DDD (degenerative disc disease), lumbar - mild multilevel degenerative disc disease with disc space narrowing at L3-L4, facet hypertrophy in the lower lumbar spine, and levoconvex rotatory scoliosis.  She continues to have lower back pain.  Other medical problems are listed as follows:  Anxiety and depression  History of glaucoma  History of hyperlipidemia-increased risk of heart disease with rheumatoid arthritis was discussed.  Dietary modifications and exercises were emphasized and a handout was placed in the AVS.  Essential  hypertension  History of gastroesophageal reflux (GERD)  Other proteinuria  Vitamin D deficiency  Orders: Orders Placed This Encounter  Procedures   QuantiFERON-TB Gold Plus   Sedimentation rate    No orders of the defined types were placed in this encounter.   Follow-Up Instructions: Return in about 5 months (around 03/17/2021) for Rheumatoid arthritis, Osteoarthritis.   Bo Merino, MD  Note - This record has been created using Editor, commissioning.  Chart creation errors have been sought, but may not always  have been located. Such creation errors do not reflect on  the standard of medical care.

## 2020-10-15 ENCOUNTER — Other Ambulatory Visit: Payer: Self-pay

## 2020-10-15 ENCOUNTER — Ambulatory Visit (INDEPENDENT_AMBULATORY_CARE_PROVIDER_SITE_OTHER): Payer: Medicare HMO | Admitting: Rheumatology

## 2020-10-15 ENCOUNTER — Encounter: Payer: Self-pay | Admitting: Rheumatology

## 2020-10-15 VITALS — BP 127/80 | HR 73 | Resp 16 | Ht 69.0 in | Wt 248.6 lb

## 2020-10-15 DIAGNOSIS — S46012D Strain of muscle(s) and tendon(s) of the rotator cuff of left shoulder, subsequent encounter: Secondary | ICD-10-CM

## 2020-10-15 DIAGNOSIS — Z8669 Personal history of other diseases of the nervous system and sense organs: Secondary | ICD-10-CM | POA: Diagnosis not present

## 2020-10-15 DIAGNOSIS — F419 Anxiety disorder, unspecified: Secondary | ICD-10-CM

## 2020-10-15 DIAGNOSIS — R69 Illness, unspecified: Secondary | ICD-10-CM | POA: Diagnosis not present

## 2020-10-15 DIAGNOSIS — M1711 Unilateral primary osteoarthritis, right knee: Secondary | ICD-10-CM | POA: Diagnosis not present

## 2020-10-15 DIAGNOSIS — Z8639 Personal history of other endocrine, nutritional and metabolic disease: Secondary | ICD-10-CM

## 2020-10-15 DIAGNOSIS — M7062 Trochanteric bursitis, left hip: Secondary | ICD-10-CM

## 2020-10-15 DIAGNOSIS — E559 Vitamin D deficiency, unspecified: Secondary | ICD-10-CM

## 2020-10-15 DIAGNOSIS — Z79899 Other long term (current) drug therapy: Secondary | ICD-10-CM

## 2020-10-15 DIAGNOSIS — M5136 Other intervertebral disc degeneration, lumbar region: Secondary | ICD-10-CM | POA: Diagnosis not present

## 2020-10-15 DIAGNOSIS — F32A Depression, unspecified: Secondary | ICD-10-CM

## 2020-10-15 DIAGNOSIS — M51369 Other intervertebral disc degeneration, lumbar region without mention of lumbar back pain or lower extremity pain: Secondary | ICD-10-CM

## 2020-10-15 DIAGNOSIS — R808 Other proteinuria: Secondary | ICD-10-CM

## 2020-10-15 DIAGNOSIS — I10 Essential (primary) hypertension: Secondary | ICD-10-CM

## 2020-10-15 DIAGNOSIS — Z8719 Personal history of other diseases of the digestive system: Secondary | ICD-10-CM

## 2020-10-15 DIAGNOSIS — M0579 Rheumatoid arthritis with rheumatoid factor of multiple sites without organ or systems involvement: Secondary | ICD-10-CM

## 2020-10-15 DIAGNOSIS — Z96652 Presence of left artificial knee joint: Secondary | ICD-10-CM

## 2020-10-15 NOTE — Patient Instructions (Addendum)
Standing Labs We placed an order today for your standing lab work.   Please have your standing labs drawn in October and every 3 months  If possible, please have your labs drawn 2 weeks prior to your appointment so that the provider can discuss your results at your appointment.  Please note that you may see your imaging and lab results in [place] before we have reviewed them. We may be awaiting multiple results to interpret others before contacting you. Please allow our office up to 72 hours to thoroughly review all of the results before contacting the office for clarification of your results.  We have open lab daily: Monday through Thursday from 1:30-4:30 PM and Friday from 1:30-4:00 PM at the office of Dr. Bo Merino, [facility].   Please be advised, all patients with office appointments requiring lab work will take precedent over walk-in lab work.  If possible, please come for your lab work on Monday and Friday afternoons, as you may experience shorter wait times. The office is located at [address], [place], [place], [place] [address] No appointment is necessary.   Labs are drawn by [facility]. Please bring your co-pay at the time of your lab draw.  You may receive a bill from [facility] for your lab work.  If you wish to have your labs drawn at another location, please call the office 24 hours in advance to send orders.  If you have any questions regarding directions or hours of operation,  please call [phone].   As a reminder, please drink plenty of water prior to coming for your lab work. Thanks!    Vaccines You are taking a medication(s) that can suppress your immune system.  The following immunizations are recommended: Flu annually Covid-19  Td/Tdap (tetanus, diphtheria, pertussis) every 10 years Pneumonia (Prevnar 15 then Pneumovax 23 at least 1 year apart.  Alternatively, can take Prevnar 20 without needing additional dose) Shingrix (after age 66): 2  doses from 4 weeks to 6 months apart  Please check with your PCP to make sure you are up to date.   If you test POSITIVE for COVID19 and have MILD to MODERATE symptoms: First, call your PCP if you would like to receive COVID19 treatment AND Hold your medications during the infection and for at least 1 week after your symptoms have resolved: Injectable medication (Benlysta, Cimzia, Cosentyx, Enbrel, Humira, Orencia, Remicade, Simponi, Stelara, Taltz, Tremfya) Methotrexate Leflunomide (Arava) Azathioprine Mycophenolate (Cellcept) Roma Kayser, or Rinvoq Otezla If you take Actemra or Kevzara, you DO NOT need to hold these for COVID19 infection.  If you test POSITIVE for COVID19 and have NO symptoms: First, call your PCP if you would like to receive COVID19 treatment AND Hold your medications for at least 10 days after the day that you tested positive Injectable medication (Benlysta, Cimzia, Cosentyx, Enbrel, Humira, Orencia, Remicade, Simponi, Stelara, Taltz, Tremfya) Methotrexate Leflunomide (Arava) Azathioprine Mycophenolate (Cellcept) Roma Kayser, or Rinvoq Otezla If you take Actemra or Kevzara, you DO NOT need to hold these for COVID19 infection.   If you have signs or symptoms of an infection or start antibiotics: First, call your PCP for workup of your infection. Hold your medication through the infection, until you complete your antibiotics, and until symptoms resolve if you take the following: Injectable medication (Actemra, Benlysta, Cimzia, Cosentyx, Enbrel, Humira, Kevzara, Orencia, Remicade, Simponi, Stelara, Taltz, Tremfya) Methotrexate Leflunomide (Arava) Mycophenolate (Cellcept) Roma Kayser, or Rinvoq  Heart Disease Prevention   Your inflammatory disease increases your  risk of heart disease which includes heart attack, stroke, atrial fibrillation (irregular heartbeats), high blood pressure, heart failure and atherosclerosis (plaque in the arteries).   It is important to reduce your risk by:   Keep blood pressure, cholesterol, and blood sugar at healthy levels   Smoking Cessation   Maintain a healthy weight  BMI 20-25   Eat a healthy diet  Plenty of fresh fruit, vegetables, and whole grains  Limit saturated fats, foods high in sodium, and added sugars  DASH and Mediterranean diet   Increase physical activity  Recommend moderate physically activity for 150 minutes per week/ 30 minutes a day for five days a week These can be broken up into three separate ten-minute sessions during the day.   Reduce Stress  Meditation, slow breathing exercises, yoga, coloring books  Dental visits twice a year     Please see a dermatologist once a year to screen for nonmelanoma skin cancer while you are on Humira.

## 2020-10-17 ENCOUNTER — Other Ambulatory Visit: Payer: Self-pay | Admitting: Physician Assistant

## 2020-10-17 LAB — QUANTIFERON-TB GOLD PLUS
Mitogen-NIL: >10 IU/mL
NIL: 0.02 IU/mL
QuantiFERON-TB Gold Plus: NEGATIVE
TB1-NIL: 0 IU/mL
TB2-NIL: 0.01 IU/mL

## 2020-10-17 LAB — SEDIMENTATION RATE: Sed Rate: 2 mm/h (ref 0–30)

## 2020-10-18 NOTE — Progress Notes (Signed)
Sed rate (inflammatory marker) is normal.  TB gold was negative.

## 2020-10-18 NOTE — Telephone Encounter (Signed)
Next Visit: 03/18/2021  Last Visit: 10/15/2020  Last Fill: 09/20/2020 (30 day supply)  DX: Rheumatoid arthritis involving multiple sites with positive rheumatoid factor  Current Dose per office note 10/15/2020: Methotrexate 5 tablets by mouth once weekly  Labs: 09/30/2020 CBC and CMP normal.  Glucose mildly elevated, probably not fasting sample.  Okay to refill MTX?

## 2020-10-25 ENCOUNTER — Encounter: Payer: Medicare HMO | Admitting: Internal Medicine

## 2020-10-26 ENCOUNTER — Other Ambulatory Visit: Payer: Self-pay | Admitting: Internal Medicine

## 2020-11-03 DIAGNOSIS — M25562 Pain in left knee: Secondary | ICD-10-CM | POA: Diagnosis not present

## 2020-11-03 DIAGNOSIS — T8484XA Pain due to internal orthopedic prosthetic devices, implants and grafts, initial encounter: Secondary | ICD-10-CM | POA: Diagnosis not present

## 2020-11-15 ENCOUNTER — Telehealth: Payer: Self-pay | Admitting: Internal Medicine

## 2020-11-16 MED ORDER — ZOLPIDEM TARTRATE 10 MG PO TABS: ORAL_TABLET | ORAL | 1 refills | Status: DC

## 2020-11-17 ENCOUNTER — Telehealth: Payer: Self-pay

## 2020-11-17 ENCOUNTER — Other Ambulatory Visit: Payer: Self-pay

## 2020-11-17 DIAGNOSIS — M0579 Rheumatoid arthritis with rheumatoid factor of multiple sites without organ or systems involvement: Secondary | ICD-10-CM

## 2020-11-17 MED ORDER — HUMIRA (2 PEN) 40 MG/0.4ML ~~LOC~~ AJKT: 40.0000 mg | AUTO-INJECTOR | SUBCUTANEOUS | 0 refills | Status: DC

## 2020-11-17 NOTE — Telephone Encounter (Signed)
Next Visit: 03/18/2021   Last Visit: 10/15/2020   Last Fill: 08/06/2020  DX: Rheumatoid arthritis involving multiple sites with positive rheumatoid factor   Current Dose per office note 10/15/2020: Humira 40 mg sq injections every 14 days  Labs: 09/30/2020 CBC and CMP normal.  Glucose mildly elevated, probably not fasting sample.  Tb Gold: 10/15/2020 Neg   Okay to refill Humira?

## 2020-11-17 NOTE — Telephone Encounter (Signed)
Patient left voicemail requesting prescription refill of Humira.

## 2020-11-17 NOTE — Telephone Encounter (Signed)
Rx submitted to pharmacy on 9/6

## 2020-11-22 ENCOUNTER — Telehealth: Payer: Self-pay

## 2020-11-22 NOTE — Telephone Encounter (Signed)
Patient called checking the status of her prescription refill of Humira.  Patient states she has not received a call to schedule delivery.

## 2020-11-22 NOTE — Telephone Encounter (Signed)
Attempted to contact the patient and left message to advise patient her prescription was sent to the pharmacy on 11/17/2020. Provided patient with number to [facility] to set up shipment.

## 2020-12-24 ENCOUNTER — Ambulatory Visit (INDEPENDENT_AMBULATORY_CARE_PROVIDER_SITE_OTHER): Payer: Medicare HMO | Admitting: Internal Medicine

## 2020-12-24 ENCOUNTER — Other Ambulatory Visit: Payer: Self-pay

## 2020-12-24 ENCOUNTER — Encounter: Payer: Self-pay | Admitting: Internal Medicine

## 2020-12-24 VITALS — BP 132/80 | HR 85 | Temp 99.1°F | Ht 69.0 in | Wt 240.0 lb

## 2020-12-24 DIAGNOSIS — R739 Hyperglycemia, unspecified: Secondary | ICD-10-CM | POA: Diagnosis not present

## 2020-12-24 DIAGNOSIS — Z23 Encounter for immunization: Secondary | ICD-10-CM | POA: Diagnosis not present

## 2020-12-24 DIAGNOSIS — R69 Illness, unspecified: Secondary | ICD-10-CM | POA: Diagnosis not present

## 2020-12-24 DIAGNOSIS — F5101 Primary insomnia: Secondary | ICD-10-CM | POA: Diagnosis not present

## 2020-12-24 DIAGNOSIS — F32A Depression, unspecified: Secondary | ICD-10-CM | POA: Diagnosis not present

## 2020-12-24 DIAGNOSIS — I10 Essential (primary) hypertension: Secondary | ICD-10-CM

## 2020-12-24 MED ORDER — CITALOPRAM HYDROBROMIDE 40 MG PO TABS: 40.0000 mg | ORAL_TABLET | Freq: Every day | ORAL | 3 refills | Status: DC

## 2020-12-24 MED ORDER — TRAZODONE HCL 50 MG PO TABS: 25.0000 mg | ORAL_TABLET | Freq: Every evening | ORAL | 1 refills | Status: DC | PRN

## 2020-12-24 NOTE — Progress Notes (Signed)
Patient ID: Pamela [name], female   DOB: 1956-01-11, 65 y.o.   MRN: [id number]        Chief Complaint: follow up worsening depression       HPI:  Pamela [name] is a 65 y.o. female here with c/o 2-3 wks worsening low mood, loss of energy, stamina, not looking forward, not getting enjoyment as she used to, wt loss and sleeping difficuly, overall at least moderate to her, but no SI or HI.  Cannot really think of major social stressors, just does not feel well, cant point to a reason.  Pt denies chest pain, increased sob or doe, wheezing, orthopnea, PND, increased LE swelling, palpitations, dizziness or syncope.   Pt denies polydipsia, polyuria, or new focal neuro s/s.  Wt Readings from Last 3 Encounters:  12/24/20 240 lb (108.9 kg)  10/15/20 248 lb 9.6 oz (112.8 kg)  05/14/20 246 lb (111.6 kg)   BP Readings from Last 3 Encounters:  12/24/20 132/80  10/15/20 127/80  07/20/20 134/74         Past Medical History:  Diagnosis Date   ALLERGIC RHINITIS 12/18/2006   Qualifier: Diagnosis of  By: Danny Lawless CMA, [place]     Anxiety    Arthritis    DEPRESSION, SITUATIONAL 03/15/2007   Qualifier: Diagnosis of  By: Wynona Luna    GERD (gastroesophageal reflux disease)    Headache    occasional   History of kidney stones    HYPERLIPIDEMIA 12/18/2006   Qualifier: History of  By: [place], [place]     Hypertension    HYPERTENSION 12/18/2006   Qualifier: Diagnosis of  By: [place], [place]     INSOMNIA-SLEEP [place] 08/25/2009   Qualifier: Diagnosis of  By: Jenny Reichmann MD, [place] Oris    Pain in knee region after total knee replacement ([place]) 04/03/2012   Pneumonia    RENAL CALCULUS, HX OF 12/18/2006   Qualifier: Diagnosis of  By: [place], [place]     Past Surgical History:  Procedure Laterality Date   CERVICAL DISC SURGERY     x 3   CONVERSION TO TOTAL KNEE Left 10/09/2016   Procedure: Conversion left uni compartment arthroplasty to total knee arthroplasty;  Surgeon: Paralee Cancel, MD;   Location: [facility];  Service: Orthopedics;  Laterality: Left;  90 mins   KNEE ARTHROSCOPY     partial   total left knee arthoplasty     10/09/16 Dr. Alvan Dame    reports that she quit smoking about 52 years ago. Her smoking use included cigarettes. She has a 0.75 pack-year smoking history. She has never used smokeless tobacco. She reports current alcohol use. She reports that she does not use drugs. family history includes Healthy in her daughter, daughter, and daughter; Heart disease in her father; Lung cancer in her brother and sister; Stroke in her mother. Allergies  Allergen Reactions   Cymbalta [Duloxetine Hcl] Other (See Comments)    Feeling weird   Current Outpatient Medications on File Prior to Visit  Medication Sig Dispense Refill   Adalimumab (HUMIRA PEN) 40 MG/0.4ML PNKT Inject 40 mg into the skin every 14 (fourteen) days. 6 each 0   ALPRAZolam (XANAX) 0.25 MG tablet TAKE 1 TABLET BY MOUTH TWICE DAILY AS NEEDED 60 tablet 2   gabapentin (NEURONTIN) 300 MG capsule Take 1 capsule (300 mg total) by mouth 2 (two) times daily. 180 capsule 1   hydrochlorothiazide (MICROZIDE) 12.5 MG capsule TAKE 1 CAPSULE(12.5 MG) BY MOUTH DAILY 90 capsule  3   losartan (COZAAR) 100 MG tablet TAKE 1 TABLET BY MOUTH DAILY 90 tablet 3   methotrexate (RHEUMATREX) 2.5 MG tablet TAKE 5 TABLETS (12.5 MG TOTAL) BY MOUTH ONCE A WEEK. CAUTION:CHEMOTHERAPY. PROTECT FROM LIGHT. 60 tablet 0   metoCLOPramide (REGLAN) 10 MG tablet Take 1 tablet (10 mg total) by mouth in the morning and at bedtime. 180 tablet 3   Multiple Vitamin (MULTIVITAMIN WITH MINERALS) TABS tablet Take 1 tablet by mouth daily.     pantoprazole (PROTONIX) 40 MG tablet TAKE 1 TABLET BY MOUTH TWICE A DAY 180 tablet 3   traMADol (ULTRAM) 50 MG tablet TAKE 1 TABLET BY MOUTH EVERY 6 HOURS AS NEEDED FOR PAIN 120 tablet 2   XALATAN 0.005 % ophthalmic solution Place 1 drop into the right eye at bedtime.      zolpidem (AMBIEN) 10 MG tablet 1 tab by mouth at  bedtime as needed 90 tablet 1   atorvastatin (LIPITOR) 20 MG tablet TAKE 1 TABLET BY MOUTH DAILY (Patient not taking: No sig reported) 90 tablet 0   B-D TB SYRINGE 1CC/27GX1/2" 27G X 1/2" 1 ML MISC USE ONCE WEEKLY (Patient not taking: Reported on 12/24/2020)     cyclobenzaprine (FLEXERIL) 5 MG tablet TAKE 1 TABLET BY MOUTH THREE TIMES A DAY AS NEEDED FOR MUSCLE SPASMS (Patient not taking: Reported on 12/24/2020) 40 tablet 1   folic acid (FOLVITE) 1 MG tablet Take 1 tablet (1 mg total) by mouth daily. (Patient not taking: No sig reported) 90 tablet 3   Insulin Syringe-Needle U-100 28G X 5/16" 1 ML MISC Use 1 syringe every 7 days to inject methotrexate. (Patient not taking: Reported on 12/24/2020) 12 each 3   No current facility-administered medications on file prior to visit.        ROS:  All others reviewed and negative.  Objective        PE:  BP 132/80 (BP Location: Right Arm, Patient Position: Sitting, Cuff Size: Large)   Pulse 85   Temp 99.1 F (37.3 C) (Oral)   Ht 5\' 9"  (1.753 m)   Wt 240 lb (108.9 kg)   SpO2 97%   BMI 35.44 kg/m                 Constitutional: Pt appears in NAD               HENT: Head: NCAT.                Right Ear: External ear normal.                 Left Ear: External ear normal.                Eyes: . Pupils are equal, round, and reactive to light. Conjunctivae and EOM are normal               Nose: without d/c or deformity               Neck: Neck supple. Gross normal ROM               Cardiovascular: Normal rate and regular rhythm.                 Pulmonary/Chest: Effort normal and breath sounds without rales or wheezing.                Abd:  Soft, NT, ND, + BS, no organomegaly  Neurological: Pt is alert. At baseline orientation, motor grossly intact               Skin: Skin is warm. No rashes, no other new lesions, LE edema - none               Psychiatric: Pt behavior is normal without agitation . + depressed affect  Micro:  none  Cardiac tracings I have personally interpreted today:  none  Pertinent Radiological findings (summarize): none   Lab Results  Component Value Date   WBC 6.9 09/30/2020   HGB 12.3 09/30/2020   HCT 37.1 09/30/2020   PLT 254 09/30/2020   GLUCOSE 131 (H) 09/30/2020   CHOL 171 07/08/2019   TRIG 76.0 07/08/2019   HDL 69.10 07/08/2019   LDLDIRECT 109.9 04/03/2012   LDLCALC 86 07/08/2019   ALT 15 09/30/2020   AST 15 09/30/2020   NA 140 09/30/2020   K 3.5 09/30/2020   CL 103 09/30/2020   CREATININE 0.92 09/30/2020   BUN 15 09/30/2020   CO2 25 09/30/2020   TSH 1.23 07/08/2019   INR 1.0 06/29/2008   HGBA1C 6.0 (H) 08/05/2019   Assessment/Plan:  Pamela [name] is a 65 y.o. Black or African American [2] female with  has a past medical history of ALLERGIC RHINITIS (12/18/2006), Anxiety, Arthritis, DEPRESSION, SITUATIONAL (03/15/2007), GERD (gastroesophageal reflux disease), Headache, History of kidney stones, HYPERLIPIDEMIA (12/18/2006), Hypertension, HYPERTENSION (12/18/2006), INSOMNIA-SLEEP DISORDER-UNSPEC (08/25/2009), Pain in knee region after total knee replacement ([place]) (04/03/2012), Pneumonia, and RENAL CALCULUS, HX OF (12/18/2006).  Depression Exam c/w endogenous depression, for change to celexa 40 qd, consider add wellbutrin if not imrpoved in 3-4 wks; declines need for counseling or psychiatry  INSOMNIA-SLEEP DISORDER-UNSPEC Likely secondary to depresson, for trazodone 50 qhs prn,  to f/u any worsening symptoms or concerns   Essential hypertension BP Readings from Last 3 Encounters:  12/24/20 132/80  10/15/20 127/80  07/20/20 134/74   Stable, pt to continue medical treatment losartan, hct   Hyperglycemia Lab Results  Component Value Date   HGBA1C 6.0 (H) 08/05/2019   Stable, pt to continue current medical treatment  - diet  Followup: Return in about 3 months (around 03/26/2021).  Cathlean Cower, MD 12/26/2020 7:28 PM [facility]

## 2020-12-24 NOTE — Patient Instructions (Addendum)
Please take all new medication as prescribed - the celexa 40 mg per day, and the trazodone for bedtime  Ok to stop the zoloft  Please call in 3-4 weeks if you need more such as adding wellbutrin XL  Please continue all other medications as before, and refills have been done if requested.  Please have the pharmacy call with any other refills you may need.  Please continue your efforts at being more active, low cholesterol diet, and weight control.  Please keep your appointments with your specialists as you may have planned  Please make an Appointment to return in 3 months, or sooner if needed

## 2020-12-26 ENCOUNTER — Encounter: Payer: Self-pay | Admitting: Internal Medicine

## 2020-12-26 NOTE — Assessment & Plan Note (Signed)
Lab Results  Component Value Date   HGBA1C 6.0 (H) 08/05/2019   Stable, pt to continue current medical treatment  - diet

## 2020-12-26 NOTE — Assessment & Plan Note (Addendum)
Exam c/w endogenous depression, for change to celexa 40 qd, consider add wellbutrin if not imrpoved in 3-4 wks; declines need for counseling or psychiatry

## 2020-12-26 NOTE — Assessment & Plan Note (Signed)
BP Readings from Last 3 Encounters:  12/24/20 132/80  10/15/20 127/80  07/20/20 134/74   Stable, pt to continue medical treatment losartan, hct

## 2020-12-26 NOTE — Assessment & Plan Note (Signed)
Likely secondary to depresson, for trazodone 50 qhs prn,  to f/u any worsening symptoms or concerns

## 2021-01-04 ENCOUNTER — Other Ambulatory Visit: Payer: Self-pay | Admitting: Internal Medicine

## 2021-01-15 ENCOUNTER — Other Ambulatory Visit: Payer: Self-pay | Admitting: Physician Assistant

## 2021-01-17 NOTE — Telephone Encounter (Signed)
Next Visit: 03/18/2021  Last Visit: 10/15/2020  Last Fill: 10/18/2020  DX: Rheumatoid arthritis involving multiple sites with positive rheumatoid factor   Current Dose per office note 10/15/2020: Methotrexate 5 tablets by mouth once weekly  Labs: 09/30/2020 CBC and CMP normal.  Glucose mildly elevated, probably not fasting sample.  Left message to advise patient she is due to update labs.  Okay to refill MTX?

## 2021-02-02 DIAGNOSIS — Z1231 Encounter for screening mammogram for malignant neoplasm of breast: Secondary | ICD-10-CM | POA: Diagnosis not present

## 2021-02-02 LAB — HM MAMMOGRAPHY

## 2021-02-11 ENCOUNTER — Other Ambulatory Visit: Payer: Self-pay | Admitting: Physician Assistant

## 2021-02-14 ENCOUNTER — Other Ambulatory Visit: Payer: Self-pay | Admitting: Physician Assistant

## 2021-02-14 NOTE — Telephone Encounter (Signed)
Patient needs an office visit.  

## 2021-02-15 ENCOUNTER — Ambulatory Visit (INDEPENDENT_AMBULATORY_CARE_PROVIDER_SITE_OTHER): Payer: Medicare HMO | Admitting: Internal Medicine

## 2021-02-15 ENCOUNTER — Other Ambulatory Visit: Payer: Self-pay

## 2021-02-15 ENCOUNTER — Encounter: Payer: Self-pay | Admitting: Internal Medicine

## 2021-02-15 VITALS — BP 144/80 | HR 92 | Temp 99.4°F | Resp 16 | Ht 69.0 in | Wt 242.0 lb

## 2021-02-15 DIAGNOSIS — R0683 Snoring: Secondary | ICD-10-CM | POA: Diagnosis not present

## 2021-02-15 DIAGNOSIS — R739 Hyperglycemia, unspecified: Secondary | ICD-10-CM | POA: Diagnosis not present

## 2021-02-15 DIAGNOSIS — Z0001 Encounter for general adult medical examination with abnormal findings: Secondary | ICD-10-CM | POA: Diagnosis not present

## 2021-02-15 DIAGNOSIS — E785 Hyperlipidemia, unspecified: Secondary | ICD-10-CM | POA: Diagnosis not present

## 2021-02-15 DIAGNOSIS — E782 Mixed hyperlipidemia: Secondary | ICD-10-CM | POA: Diagnosis not present

## 2021-02-15 DIAGNOSIS — J069 Acute upper respiratory infection, unspecified: Secondary | ICD-10-CM

## 2021-02-15 DIAGNOSIS — E559 Vitamin D deficiency, unspecified: Secondary | ICD-10-CM

## 2021-02-15 DIAGNOSIS — R69 Illness, unspecified: Secondary | ICD-10-CM | POA: Diagnosis not present

## 2021-02-15 DIAGNOSIS — E538 Deficiency of other specified B group vitamins: Secondary | ICD-10-CM | POA: Diagnosis not present

## 2021-02-15 DIAGNOSIS — F411 Generalized anxiety disorder: Secondary | ICD-10-CM

## 2021-02-15 LAB — BASIC METABOLIC PANEL
BUN: 15 mg/dL (ref 6–23)
CO2: 27 mEq/L (ref 19–32)
Calcium: 9.8 mg/dL (ref 8.4–10.5)
Chloride: 102 mEq/L (ref 96–112)
Creatinine, Ser: 0.98 mg/dL (ref 0.40–1.20)
GFR: 60.15 mL/min (ref >60.00)
Glucose, Bld: 91 mg/dL (ref 70–99)
Potassium: 3.8 mEq/L (ref 3.5–5.1)
Sodium: 139 mEq/L (ref 135–145)

## 2021-02-15 LAB — URINALYSIS, ROUTINE W REFLEX MICROSCOPIC
Bilirubin Urine: NEGATIVE
Hgb urine dipstick: NEGATIVE
Ketones, ur: NEGATIVE
Nitrite: NEGATIVE
Specific Gravity, Urine: 1.01 (ref 1.000–1.030)
Urine Glucose: NEGATIVE
Urobilinogen, UA: 0.2 (ref 0.0–1.0)
pH: 6.5 (ref 5.0–8.0)

## 2021-02-15 LAB — HEPATIC FUNCTION PANEL
ALT: 13 U/L (ref 0–35)
AST: 17 U/L (ref 0–37)
Albumin: 4.4 g/dL (ref 3.5–5.2)
Alkaline Phosphatase: 52 U/L (ref 39–117)
Bilirubin, Direct: 0.1 mg/dL (ref 0.0–0.3)
Total Bilirubin: 0.5 mg/dL (ref 0.2–1.2)
Total Protein: 7.6 g/dL (ref 6.0–8.3)

## 2021-02-15 LAB — CBC WITH DIFFERENTIAL/PLATELET
Basophils Absolute: 0.1 10*3/uL (ref 0.0–0.1)
Basophils Relative: 2.1 % (ref 0.0–3.0)
Eosinophils Absolute: 0.3 10*3/uL (ref 0.0–0.7)
Eosinophils Relative: 4.6 % (ref 0.0–5.0)
HCT: 40 % (ref 36.0–46.0)
Hemoglobin: 13.1 g/dL (ref 12.0–15.0)
Lymphocytes Relative: 23.6 % (ref 12.0–46.0)
Lymphs Abs: 1.5 10*3/uL (ref 0.7–4.0)
MCHC: 32.7 g/dL (ref 30.0–36.0)
MCV: 86.8 fl (ref 78.0–100.0)
Monocytes Absolute: 0.9 10*3/uL (ref 0.1–1.0)
Monocytes Relative: 15 % — ABNORMAL HIGH (ref 3.0–12.0)
Neutro Abs: 3.4 10*3/uL (ref 1.4–7.7)
Neutrophils Relative %: 54.7 % (ref 43.0–77.0)
Platelets: 243 10*3/uL (ref 150.0–400.0)
RBC: 4.61 Mil/uL (ref 3.87–5.11)
RDW: 13.2 % (ref 11.5–15.5)
WBC: 6.2 10*3/uL (ref 4.0–10.5)

## 2021-02-15 LAB — LIPID PANEL
Cholesterol: 220 mg/dL — ABNORMAL HIGH (ref 0–200)
HDL: 87.8 mg/dL (ref >39.00)
LDL Cholesterol: 117 mg/dL — ABNORMAL HIGH (ref 0–99)
NonHDL: 132.67
Total CHOL/HDL Ratio: 3
Triglycerides: 79 mg/dL (ref 0.0–149.0)
VLDL: 15.8 mg/dL (ref 0.0–40.0)

## 2021-02-15 LAB — VITAMIN B12: Vitamin B-12: 549 pg/mL (ref 211–911)

## 2021-02-15 LAB — VITAMIN D 25 HYDROXY (VIT D DEFICIENCY, FRACTURES): VITD: 37.72 ng/mL (ref 30.00–100.00)

## 2021-02-15 LAB — HEMOGLOBIN A1C: Hgb A1c MFr Bld: 5.6 % (ref 4.6–6.5)

## 2021-02-15 LAB — TSH: TSH: 1.01 u[IU]/mL (ref 0.35–5.50)

## 2021-02-15 LAB — POCT INFLUENZA A/B
Influenza A, POC: NEGATIVE
Influenza B, POC: NEGATIVE

## 2021-02-15 LAB — POC COVID19 BINAXNOW: SARS Coronavirus 2 Ag: NEGATIVE

## 2021-02-15 MED ORDER — ATORVASTATIN CALCIUM 20 MG PO TABS: 20.0000 mg | ORAL_TABLET | Freq: Every day | ORAL | 3 refills | Status: DC

## 2021-02-15 MED ORDER — DOXYCYCLINE HYCLATE 100 MG PO TABS: 100.0000 mg | ORAL_TABLET | Freq: Two times a day (BID) | ORAL | 0 refills | Status: DC

## 2021-02-15 NOTE — Patient Instructions (Addendum)
Please take all new medication as prescribed - the antibiotic  Please continue all other medications as before, and refills have been done if requested - the lipitor  You will be contacted regarding the referral for: ENT for the snoring  Please have the pharmacy call with any other refills you may need.  Please continue your efforts at being more active, low cholesterol diet, and weight control.  You are otherwise up to date with prevention measures today.  Please keep your appointments with your specialists as you may have planned  Please go to the LAB at the blood drawing area for the tests to be done  You will be contacted by phone if any changes need to be made immediately.  Otherwise, you will receive a letter about your results with an explanation, but please check with MyChart first.  Please remember to sign up for MyChart if you have not done so, as this will be important to you in the future with finding out test results, communicating by private email, and scheduling acute appointments online when needed.  Please make an Appointment to return in 6 months, or sooner if needed

## 2021-02-15 NOTE — Progress Notes (Signed)
Patient ID: Pamela [name], female   DOB: 01-04-1956, 65 y.o.   MRN: [id number]         Chief Complaint:: wellness exam and Office Visit (Discuss ENT referral)  For snoring, and URi symptoms, anxiety       HPI:  Pamela [name] is a 65 y.o. female here for wellness exam; plans to call soon for mammogram, decline pneumovax for now; o/w up to date                        Also has been out of lipitor for 2 wks, but certainly willing to restart.  Trying to follow low chol diet. Denies worsening depressive symptoms, suicidal ideation, or panic; has ongoing anxiety, declines change in tx today Pt denies chest pain, increased sob or doe, wheezing, orthopnea, PND, increased LE swelling, palpitations, dizziness or syncope.   Pt denies polydipsia, polyuria, or new focal neuro s/s.   Pt denies fever, wt loss, night sweats, loss of appetite, or other constitutional symptoms  Does have mild 3 days osnet nasal congestion, slight ST and non prod cough.  Also here to mention husband asks for her to get ENT referral for snoring as this seems quite severe in past year.   Wt Readings from Last 3 Encounters:  02/15/21 242 lb (109.8 kg)  12/24/20 240 lb (108.9 kg)  10/15/20 248 lb 9.6 oz (112.8 kg)   BP Readings from Last 3 Encounters:  02/15/21 (!) 144/80  12/24/20 132/80  10/15/20 127/80   Immunization History  Administered Date(s) Administered   Fluad Quad(high Dose 65+) 04/16/2020, 12/24/2020   Influenza Whole 11/12/2012   Influenza,inj,Quad PF,6+ Mos 11/29/2016, 12/11/2017, 12/25/2018   Influenza-Unspecified 12/15/2013, 12/12/2015, 11/29/2016   Td 11/03/2002   Tdap 09/23/2015   Zoster Recombinat (Shingrix) 03/22/2018, 08/20/2018   Zoster, Live 08/12/2012   There are no preventive care reminders to display for this patient.     Past Medical History:  Diagnosis Date   ALLERGIC RHINITIS 12/18/2006   Qualifier: Diagnosis of  By: Danny Lawless CMA, [place]     Anxiety    Arthritis    DEPRESSION, SITUATIONAL  03/15/2007   Qualifier: Diagnosis of  By: Wynona Luna    GERD (gastroesophageal reflux disease)    Headache    occasional   History of kidney stones    HYPERLIPIDEMIA 12/18/2006   Qualifier: History of  By: [place], [place]     Hypertension    HYPERTENSION 12/18/2006   Qualifier: Diagnosis of  By: [place], [place]     INSOMNIA-SLEEP [place] 08/25/2009   Qualifier: Diagnosis of  By: Jenny Reichmann MD, [facility] Oris    Pain in knee region after total knee replacement ([place]) 04/03/2012   Pneumonia    RENAL CALCULUS, HX OF 12/18/2006   Qualifier: Diagnosis of  By: [place], [place]     Past Surgical History:  Procedure Laterality Date   CERVICAL DISC SURGERY     x 3   CONVERSION TO TOTAL KNEE Left 10/09/2016   Procedure: Conversion left uni compartment arthroplasty to total knee arthroplasty;  Surgeon: Paralee Cancel, MD;  Location: [facility];  Service: Orthopedics;  Laterality: Left;  90 mins   KNEE ARTHROSCOPY     partial   total left knee arthoplasty     10/09/16 Dr. Alvan Dame    reports that she quit smoking about 52 years ago. Her smoking use included cigarettes. She has a 0.75 pack-year smoking history. She has  never used smokeless tobacco. She reports current alcohol use. She reports that she does not use drugs. family history includes Healthy in her daughter, daughter, and daughter; Heart disease in her father; Lung cancer in her brother and sister; Stroke in her mother. Allergies  Allergen Reactions   Cymbalta [Duloxetine Hcl] Other (See Comments)    Feeling weird   Current Outpatient Medications on File Prior to Visit  Medication Sig Dispense Refill   Adalimumab (HUMIRA PEN) 40 MG/0.4ML PNKT Inject 40 mg into the skin every 14 (fourteen) days. 6 each 0   ALPRAZolam (XANAX) 0.25 MG tablet TAKE 1 TABLET BY MOUTH TWICE DAILY AS NEEDED 60 tablet 2   citalopram (CELEXA) 40 MG tablet Take 1 tablet (40 mg total) by mouth daily. 90 tablet 3   gabapentin (NEURONTIN) 300 MG capsule  Take 1 capsule (300 mg total) by mouth 2 (two) times daily. 180 capsule 1   hydrochlorothiazide (MICROZIDE) 12.5 MG capsule TAKE 1 CAPSULE(12.5 MG) BY MOUTH DAILY 90 capsule 3   losartan (COZAAR) 100 MG tablet TAKE 1 TABLET BY MOUTH DAILY 90 tablet 3   methotrexate (RHEUMATREX) 2.5 MG tablet TAKE 5 TABLETS (12.5 MG TOTAL) BY MOUTH ONCE A WEEK. CAUTION:CHEMOTHERAPY. PROTECT FROM LIGHT. 20 tablet 0   metoCLOPramide (REGLAN) 10 MG tablet TAKE 1 TABLET BY MOUTH IN THE MORNING AND AT BEDTIME. 180 tablet 0   Multiple Vitamin (MULTIVITAMIN WITH MINERALS) TABS tablet Take 1 tablet by mouth daily.     pantoprazole (PROTONIX) 40 MG tablet TAKE 1 TABLET BY MOUTH TWICE A DAY 180 tablet 3   traMADol (ULTRAM) 50 MG tablet TAKE 1 TABLET BY MOUTH EVERY 6 HOURS AS NEEDED FOR PAIN 120 tablet 2   traZODone (DESYREL) 50 MG tablet Take 0.5-1 tablets (25-50 mg total) by mouth at bedtime as needed for sleep. 90 tablet 1   XALATAN 0.005 % ophthalmic solution Place 1 drop into the right eye at bedtime.      zolpidem (AMBIEN) 10 MG tablet 1 tab by mouth at bedtime as needed 90 tablet 1   B-D TB SYRINGE 1CC/27GX1/2" 27G X 1/2" 1 ML MISC USE ONCE WEEKLY (Patient not taking: Reported on 12/24/2020)     cyclobenzaprine (FLEXERIL) 5 MG tablet TAKE 1 TABLET BY MOUTH THREE TIMES A DAY AS NEEDED FOR MUSCLE SPASMS (Patient not taking: Reported on 12/24/2020) 40 tablet 1   folic acid (FOLVITE) 1 MG tablet Take 1 tablet (1 mg total) by mouth daily. (Patient not taking: Reported on 10/15/2020) 90 tablet 3   Insulin Syringe-Needle U-100 28G X 5/16" 1 ML MISC Use 1 syringe every 7 days to inject methotrexate. (Patient not taking: Reported on 12/24/2020) 12 each 3   No current facility-administered medications on file prior to visit.        ROS:  All others reviewed and negative.  Objective        PE:  BP (!) 144/80 (BP Location: Right Arm, Patient Position: Sitting, Cuff Size: Large)   Pulse 92   Temp 99.4 F (37.4 C) (Oral)   Resp  16   Ht 5\' 9"  (1.753 m)   Wt 242 lb (109.8 kg)   BMI 35.74 kg/m                 Constitutional: Pt appears in NAD               HENT: Head: NCAT.  Right Ear: External ear normal.                 Left Ear: External ear normal. Bilat tm's with mild erythema.  Max sinus areas non tender.  Pharynx with mild erythema, no exudate               Eyes: . Pupils are equal, round, and reactive to light. Conjunctivae and EOM are normal               Nose: without d/c or deformity               Neck: Neck supple. Gross normal ROM               Cardiovascular: Normal rate and regular rhythm.                 Pulmonary/Chest: Effort normal and breath sounds without rales or wheezing.                Abd:  Soft, NT, ND, + BS, no organomegaly               Neurological: Pt is alert. At baseline orientation, motor grossly intact               Skin: Skin is warm. No rashes, no other new lesions, LE edema - none               Psychiatric: Pt behavior is normal without agitation   Micro: none  Cardiac tracings I have personally interpreted today:  none  Pertinent Radiological findings (summarize): none   Lab Results  Component Value Date   WBC 6.2 02/15/2021   HGB 13.1 02/15/2021   HCT 40.0 02/15/2021   PLT 243.0 02/15/2021   GLUCOSE 91 02/15/2021   CHOL 220 (H) 02/15/2021   TRIG 79.0 02/15/2021   HDL 87.80 02/15/2021   LDLDIRECT 109.9 04/03/2012   LDLCALC 117 (H) 02/15/2021   ALT 13 02/15/2021   AST 17 02/15/2021   NA 139 02/15/2021   K 3.8 02/15/2021   CL 102 02/15/2021   CREATININE 0.98 02/15/2021   BUN 15 02/15/2021   CO2 27 02/15/2021   TSH 1.01 02/15/2021   INR 1.0 06/29/2008   HGBA1C 5.6 02/15/2021   Influenza A, POC Negative Negative   Influenza B, POC Negative Negative    SARS Coronavirus 2 Ag Negative Negative    Assessment/Plan:  Pamela [name] is a 65 y.o. Black or African American [2] female with  has a past medical history of ALLERGIC RHINITIS  (12/18/2006), Anxiety, Arthritis, DEPRESSION, SITUATIONAL (03/15/2007), GERD (gastroesophageal reflux disease), Headache, History of kidney stones, HYPERLIPIDEMIA (12/18/2006), Hypertension, HYPERTENSION (12/18/2006), INSOMNIA-SLEEP DISORDER-UNSPEC (08/25/2009), Pain in knee region after total knee replacement ([place]) (04/03/2012), Pneumonia, and RENAL CALCULUS, HX OF (12/18/2006).  Encounter for well adult exam with abnormal findings Age and sex appropriate education and counseling updated with regular exercise and diet Referrals for preventative services - pt to call for mammogram, Immunizations addressed - declines pneumovax Smoking counseling  - none needed Evidence for depression or other mood disorder - stable anxiety Most recent labs reviewed. I have personally reviewed and have noted: 1) the patient's medical and social history 2) The patient's current medications and supplements 3) The patient's height, weight, and BMI have been recorded in the chart   HLD (hyperlipidemia) Lab Results  Component Value Date   LDLCALC 117 (H) 02/15/2021   Uncontroled, but pt has been out of med x  2 wks, pt to continue current statin lipitor 20   Hyperglycemia Lab Results  Component Value Date   HGBA1C 5.6 02/15/2021   Stable, pt to continue current medical treatment  - diet   Anxiety state Stable overall, cont current med tx - celexa, xanax prn  Acute upper respiratory infection covid and flu neg, Mild to mod, for antibx course,  to f/u any worsening symptoms or concerns  Snoring Ok for ENT referral per pt request  Followup: Return in about 6 months (around 08/16/2021).  Cathlean Cower, MD 02/20/2021 4:09 PM [facility]

## 2021-02-19 ENCOUNTER — Other Ambulatory Visit: Payer: Self-pay | Admitting: Physician Assistant

## 2021-02-20 ENCOUNTER — Encounter: Payer: Self-pay | Admitting: Internal Medicine

## 2021-02-20 NOTE — Assessment & Plan Note (Signed)
Lab Results  Component Value Date   HGBA1C 5.6 02/15/2021   Stable, pt to continue current medical treatment  - diet

## 2021-02-20 NOTE — Assessment & Plan Note (Signed)
Stable overall, cont current med tx - celexa, xanax prn

## 2021-02-20 NOTE — Assessment & Plan Note (Addendum)
covid and flu neg, Mild to mod, for antibx course,  to f/u any worsening symptoms or concerns

## 2021-02-20 NOTE — Assessment & Plan Note (Signed)
Lab Results  Component Value Date   LDLCALC 117 (H) 02/15/2021   Uncontroled, but pt has been out of med x 2 wks, pt to continue current statin lipitor 20

## 2021-02-20 NOTE — Assessment & Plan Note (Signed)
Age and sex appropriate education and counseling updated with regular exercise and diet Referrals for preventative services - pt to call for mammogram, Immunizations addressed - declines pneumovax Smoking counseling  - none needed Evidence for depression or other mood disorder - stable anxiety Most recent labs reviewed. I have personally reviewed and have noted: 1) the patient's medical and social history 2) The patient's current medications and supplements 3) The patient's height, weight, and BMI have been recorded in the chart

## 2021-02-20 NOTE — Assessment & Plan Note (Signed)
[place] for ENT referral per pt request

## 2021-02-21 NOTE — Telephone Encounter (Signed)
Next Visit: 03/18/2021  Last Visit: 10/15/2020  Last Fill: 01/17/2021 (30 day supply)  DX: Rheumatoid arthritis involving multiple sites with positive rheumatoid factor   Current Dose per office note 10/15/2020: Methotrexate 5 tablets by mouth once weekly  Labs: 02/15/2021 Monocytes Relative 15.0   Okay to refill MTX?

## 2021-03-01 ENCOUNTER — Other Ambulatory Visit: Payer: Self-pay

## 2021-03-01 DIAGNOSIS — M0579 Rheumatoid arthritis with rheumatoid factor of multiple sites without organ or systems involvement: Secondary | ICD-10-CM

## 2021-03-01 MED ORDER — HUMIRA (2 PEN) 40 MG/0.4ML ~~LOC~~ AJKT: 40.0000 mg | AUTO-INJECTOR | SUBCUTANEOUS | 0 refills | Status: DC

## 2021-03-01 NOTE — Telephone Encounter (Signed)
Next Visit: 03/18/2021  Last Visit: 10/15/2020  Last Fill: 11/17/2020  DX: Rheumatoid arthritis involving multiple sites with positive rheumatoid factor   Current Dose per office note 10/15/2020: Humira 40 mg sq injections every 14 days  Labs: 02/15/2021 BMP, CBC w/Diff Monocytes Relative 15.0, Total Protein, Urine Trace, Leukocytes, UA Trace, WBC, UA 3-6/hpf, Mucus, UA presence of, Cholesterol 220, LDL Cholesterol 117,   TB Gold: 10/15/2020, negative   Okay to refill Humira?

## 2021-03-01 NOTE — Telephone Encounter (Signed)
Patient called requesting prescription refill for Humira pen to be sent to [facility].

## 2021-03-03 ENCOUNTER — Telehealth: Payer: Self-pay

## 2021-03-03 NOTE — Telephone Encounter (Signed)
Received notification from [facility] regarding a prior authorization for [place]. Authorization has been APPROVED from 03/13/20 to 03/12/22.   Phone # [phone]  Patient receives through [employer] PAP. Called [employer] for update on 2023 renewal. Received a verbal confirmation from  [place] regarding an approval for [employer] patient assistance from 03/13/21 to 03/12/22.   Phone number: [phone]   Knox Saliva, PharmD, MPH, BCPS Clinical Pharmacist (Rheumatology and Pulmonology)

## 2021-03-03 NOTE — Telephone Encounter (Signed)
Prior Authorization# [id number]

## 2021-03-03 NOTE — Telephone Encounter (Signed)
Per [employer], current Prior Authorization is expiring.  Submitted a Prior Authorization request to [facility] for HUMIRA via CoverMyMeds. Will update once we receive a response.   Key: Pamela [name]

## 2021-03-04 NOTE — Progress Notes (Deleted)
Office Visit Note  Patient: Pamela [name]             Date of Birth: 1955/12/06           MRN: [id number]             PCP: Biagio Borg, MD Referring: Biagio Borg, MD Visit Date: 03/18/2021 Occupation: @GUAROCC @  Subjective:    History of Present Illness: Pamela [name] is a 65 y.o. female with history of seropositive rheumatoid arthritis, osteoarthritis, and DDD. She is on humira 40 mg sq injections every 14 days, methotrexate 5 tablets by mouth once weekly, and folic acid 1 mg daily.   CBC, BMP, and hepatic function panel updated on 02/15/21.  She will be due to update lab work in March and every 3 months to monitor for drug toxicity.   TB gold negative on 10/15/20. Discussed the importance of holding humira and methotrexate if she develops signs or symptoms of an infection and to resume once the infection has completely cleared.   Activities of Daily Living:  Patient reports morning stiffness for *** {minute/hour:19697}.   Patient {ACTIONS;DENIES/REPORTS:21021675::"Denies"} nocturnal pain.  Difficulty dressing/grooming: {ACTIONS;DENIES/REPORTS:21021675::"Denies"} Difficulty climbing stairs: {ACTIONS;DENIES/REPORTS:21021675::"Denies"} Difficulty getting out of chair: {ACTIONS;DENIES/REPORTS:21021675::"Denies"} Difficulty using hands for taps, buttons, cutlery, and/or writing: {ACTIONS;DENIES/REPORTS:21021675::"Denies"}  No Rheumatology ROS completed.   PMFS History:  Patient Active Problem List   Diagnosis Date Noted   Acute upper respiratory infection 02/15/2021   Snoring 02/15/2021   Spinal stenosis of lumbar region 08/05/2019   Inability to walk 08/05/2019   Hives 07/08/2019   History of glaucoma 04/24/2019   Bursitis of left hip 12/25/2018   MGUS (monoclonal gammopathy of unknown significance) 05/11/2018   Rheumatoid arthritis involving multiple sites with positive rheumatoid factor ([place]) 05/03/2018   High risk medication use 05/03/2018   Status post total left knee  replacement 05/03/2018   DDD (degenerative disc disease), lumbar 05/03/2018   Primary osteoarthritis of right knee 04/05/2018   Arthritis of left wrist 12/19/2017   Left rotator cuff tear 12/19/2017   Left wrist pain 12/12/2017   Left shoulder pain 08/22/2017   Acute pain of right knee 04/17/2017   Pain of left calf 04/13/2017   S/P left TK revision 10/09/2016   Hyperglycemia 09/23/2015   Anxiety state 09/23/2015   Abnormal urine odor 08/25/2015   GERD without esophagitis 08/25/2015   Upper abdominal pain 04/16/2015   Early satiety 04/16/2015   Dysuria 04/16/2015   Encounter for well adult exam with abnormal findings 06/18/2014   Greater trochanteric bursitis of right hip 01/06/2013   Left lumbar radiculitis 04/03/2012   Eczema 04/03/2012   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   BURSITIS, RIGHT HIP 08/25/2009   INSOMNIA-SLEEP DISORDER-UNSPEC 08/25/2009   FATIGUE 08/25/2009   Cervicalgia 08/14/2007   Depression 03/15/2007   HLD (hyperlipidemia) 12/18/2006   Essential hypertension 12/18/2006   Allergic rhinitis 12/18/2006   RENAL CALCULUS, HX OF 12/18/2006    Past Medical History:  Diagnosis Date   ALLERGIC RHINITIS 12/18/2006   Qualifier: Diagnosis of  By: Danny Lawless CMA, [place]     Anxiety    Arthritis    DEPRESSION, SITUATIONAL 03/15/2007   Qualifier: Diagnosis of  By: Wynona Luna    GERD (gastroesophageal reflux disease)    Headache    occasional   History of kidney stones    HYPERLIPIDEMIA 12/18/2006   Qualifier: History of  By: [place], [place]     Hypertension    HYPERTENSION 12/18/2006  Qualifier: Diagnosis of  By: [place], [place]     INSOMNIA-SLEEP [place] 08/25/2009   Qualifier: Diagnosis of  By: Jenny Reichmann MD, Hunt Oris    Pain in knee region after total knee replacement ([place]) 04/03/2012   Pneumonia    RENAL CALCULUS, HX OF 12/18/2006   Qualifier: Diagnosis of  By: Danny Lawless CMA, [place]      Family History  Problem Relation Age of Onset   Stroke Mother     Heart disease Father    Lung cancer Sister    Lung cancer Brother    Healthy Daughter    Healthy Daughter    Healthy Daughter    Colon cancer Neg Hx    Esophageal cancer Neg Hx    Pancreatic cancer Neg Hx    Stomach cancer Neg Hx    Past Surgical History:  Procedure Laterality Date   CERVICAL DISC SURGERY     x 3   CONVERSION TO TOTAL KNEE Left 10/09/2016   Procedure: Conversion left uni compartment arthroplasty to total knee arthroplasty;  Surgeon: Paralee Cancel, MD;  Location: [facility];  Service: Orthopedics;  Laterality: Left;  90 mins   KNEE ARTHROSCOPY     partial   total left knee arthoplasty     10/09/16 Dr. Alvan Dame   Social History   Social History Narrative   Not on file   Immunization History  Administered Date(s) Administered   Fluad Quad(high Dose 65+) 04/16/2020, 12/24/2020   Influenza Whole 11/12/2012   Influenza,inj,Quad PF,6+ Mos 11/29/2016, 12/11/2017, 12/25/2018   Influenza-Unspecified 12/15/2013, 12/12/2015, 11/29/2016   Td 11/03/2002   Tdap 09/23/2015   Zoster Recombinat (Shingrix) 03/22/2018, 08/20/2018   Zoster, Live 08/12/2012     Objective: Vital Signs: There were no vitals taken for this visit.   Physical Exam Vitals and nursing note reviewed.  Constitutional:      Appearance: She is well-developed.  HENT:     Head: Normocephalic and atraumatic.  Eyes:     Conjunctiva/sclera: Conjunctivae normal.  Pulmonary:     Effort: Pulmonary effort is normal.  Abdominal:     Palpations: Abdomen is soft.  Musculoskeletal:     Cervical back: Normal range of motion.  Skin:    General: Skin is warm and dry.     Capillary Refill: Capillary refill takes less than 2 seconds.  Neurological:     Mental Status: She is alert and oriented to person, place, and time.  Psychiatric:        Behavior: Behavior normal.     Musculoskeletal Exam: ***  CDAI Exam: CDAI Score: -- Patient Global: --; Provider Global: -- Swollen: --; Tender: -- Joint Exam  03/18/2021   No joint exam has been documented for this visit   There is currently no information documented on the homunculus. Go to the Rheumatology activity and complete the homunculus joint exam.  Investigation: No additional findings.  Imaging: No results found.  Recent Labs: Lab Results  Component Value Date   WBC 6.2 02/15/2021   HGB 13.1 02/15/2021   PLT 243.0 02/15/2021   NA 139 02/15/2021   K 3.8 02/15/2021   CL 102 02/15/2021   CO2 27 02/15/2021   GLUCOSE 91 02/15/2021   BUN 15 02/15/2021   CREATININE 0.98 02/15/2021   BILITOT 0.5 02/15/2021   ALKPHOS 52 02/15/2021   AST 17 02/15/2021   ALT 13 02/15/2021   PROT 7.6 02/15/2021   ALBUMIN 4.4 02/15/2021   CALCIUM 9.8 02/15/2021   GFRAA 74 05/14/2020  [facility] NEGATIVE 10/15/2020    Speciality Comments: PLQ Eye Exam: 03/26/2019 WNL @ [place] OD PA FOllow up in 1 year  Procedures:  No procedures performed Allergies: Cymbalta [duloxetine hcl]   Assessment / Plan:     Visit Diagnoses: No diagnosis found.  Orders: No orders of the defined types were placed in this encounter.  No orders of the defined types were placed in this encounter.   Face-to-face time spent with patient was *** minutes. Greater than 50% of time was spent in counseling and coordination of care.  Follow-Up Instructions: No follow-ups on file.   Earnestine Mealing, CMA  Note - This record has been created using Editor, commissioning.  Chart creation errors have been sought, but may not always  have been located. Such creation errors do not reflect on  the standard of medical care.

## 2021-03-18 ENCOUNTER — Ambulatory Visit: Payer: Medicare HMO | Admitting: Physician Assistant

## 2021-03-21 ENCOUNTER — Other Ambulatory Visit: Payer: Self-pay | Admitting: Internal Medicine

## 2021-03-21 ENCOUNTER — Other Ambulatory Visit: Payer: Self-pay | Admitting: Physician Assistant

## 2021-03-29 ENCOUNTER — Ambulatory Visit (INDEPENDENT_AMBULATORY_CARE_PROVIDER_SITE_OTHER): Payer: Medicare HMO

## 2021-03-29 ENCOUNTER — Ambulatory Visit (INDEPENDENT_AMBULATORY_CARE_PROVIDER_SITE_OTHER): Payer: Medicare HMO | Admitting: Internal Medicine

## 2021-03-29 ENCOUNTER — Encounter: Payer: Self-pay | Admitting: Internal Medicine

## 2021-03-29 ENCOUNTER — Other Ambulatory Visit: Payer: Self-pay

## 2021-03-29 VITALS — BP 126/72 | HR 98 | Temp 98.6°F | Ht 69.0 in | Wt 239.0 lb

## 2021-03-29 DIAGNOSIS — Z0001 Encounter for general adult medical examination with abnormal findings: Secondary | ICD-10-CM

## 2021-03-29 DIAGNOSIS — R059 Cough, unspecified: Secondary | ICD-10-CM

## 2021-03-29 DIAGNOSIS — E538 Deficiency of other specified B group vitamins: Secondary | ICD-10-CM | POA: Diagnosis not present

## 2021-03-29 DIAGNOSIS — F32A Depression, unspecified: Secondary | ICD-10-CM

## 2021-03-29 DIAGNOSIS — R69 Illness, unspecified: Secondary | ICD-10-CM | POA: Diagnosis not present

## 2021-03-29 DIAGNOSIS — E782 Mixed hyperlipidemia: Secondary | ICD-10-CM | POA: Diagnosis not present

## 2021-03-29 DIAGNOSIS — E559 Vitamin D deficiency, unspecified: Secondary | ICD-10-CM | POA: Diagnosis not present

## 2021-03-29 DIAGNOSIS — R739 Hyperglycemia, unspecified: Secondary | ICD-10-CM | POA: Diagnosis not present

## 2021-03-29 DIAGNOSIS — K219 Gastro-esophageal reflux disease without esophagitis: Secondary | ICD-10-CM | POA: Diagnosis not present

## 2021-03-29 NOTE — Patient Instructions (Signed)
Please take OTC Vitamin D3 at 2000 units per day  Please continue all other medications as before, and refills have been done if requested.  Please have the pharmacy call with any other refills you may need.  Please continue your efforts at being more active, low cholesterol diet, and weight control.  You are otherwise up to date with prevention measures today.  Please keep your appointments with your specialists as you may have planned  Please go to the XRAY Department in the first floor for the x-ray testing  You will be contacted by phone if any changes need to be made immediately.  Otherwise, you will receive a letter about your results with an explanation, but please check with MyChart first.  Please remember to sign up for MyChart if you have not done so, as this will be important to you in the future with finding out test results, communicating by private email, and scheduling acute appointments online when needed.  Please make an Appointment to return in 6 months, or sooner if needed, also with Lab Appointment for testing done 3-5 days before at the [facility] (so this is for TWO appointments - please see the scheduling desk as you leave)  Due to the ongoing Covid 19 pandemic, our lab now requires an appointment for any labs done at our office.  If you need labs done and do not have an appointment, please call our office ahead of time to schedule before presenting to the lab for your testing.

## 2021-03-29 NOTE — Progress Notes (Signed)
Patient ID: Pamela [name], female   DOB: [birth year], 66 y.o.   MRN: [id number]         Chief Complaint:: wellness exam and Follow-up (Patient c/o having a cough at night with drainage in the morning)  , depression, hld, low vit d, hyperglycemia,        HPI:  Pamela [name] is a 66 y.o. female here for wellness exam; declines pneumovax, o/w up to date.                        Also not taking Vit d.  Has c/o persistent 1 month non prod cough at night mostly except first thing in the AM with yellowish sputum o/w ok the rest of the day, but Pt denies chest pain, increased sob or doe, wheezing, orthopnea, PND, increased LE swelling, palpitations, dizziness or syncope.   Pt denies fever, wt loss, night sweats, loss of appetite, or other constitutional symptoms  Tolerating statin well that she restarted.   States has much improved depressive symptoms, and no suicidal ideation, or panic; has ongoing anxiety, all since last visit.  Declines lab f/u today.  States no worsening allergies recently, and Denies worsening reflux, abd pain, dysphagia, n/v, bowel change or blood.  Pt denies chest pain, increased sob or doe, wheezing, orthopnea, PND, increased LE swelling, palpitations, dizziness or syncope.  Trying to follow lower chol diet.    Wt Readings from Last 3 Encounters:  03/29/21 239 lb (108.4 kg)  02/15/21 242 lb (109.8 kg)  12/24/20 240 lb (108.9 kg)   BP Readings from Last 3 Encounters:  03/29/21 126/72  02/15/21 (!) 144/80  12/24/20 132/80   Immunization History  Administered Date(s) Administered   Fluad Quad(high Dose 65+) 04/16/2020, 12/24/2020   Influenza Whole 11/12/2012   Influenza,inj,Quad PF,6+ Mos 11/29/2016, 12/11/2017, 12/25/2018   Influenza-Unspecified 12/15/2013, 12/12/2015, 11/29/2016   Td 11/03/2002   Tdap 09/23/2015   Zoster Recombinat (Shingrix) 03/22/2018, 08/20/2018   Zoster, Live 08/12/2012  There are no preventive care reminders to display for this patient.    Past  Medical History:  Diagnosis Date   ALLERGIC RHINITIS 12/18/2006   Qualifier: Diagnosis of  By: Danny Lawless CMA, [place]     Anxiety    Arthritis    DEPRESSION, SITUATIONAL 03/15/2007   Qualifier: Diagnosis of  By: Wynona Luna    GERD (gastroesophageal reflux disease)    Headache    occasional   History of kidney stones    HYPERLIPIDEMIA 12/18/2006   Qualifier: History of  By: [place], [place]     Hypertension    HYPERTENSION 12/18/2006   Qualifier: Diagnosis of  By: [place], [place]     INSOMNIA-SLEEP [place] 08/25/2009   Qualifier: Diagnosis of  By: Jenny Reichmann MD, [place] Oris    Pain in knee region after total knee replacement ([place]) 04/03/2012   Pneumonia    RENAL CALCULUS, HX OF 12/18/2006   Qualifier: Diagnosis of  By: [place], [place]     Past Surgical History:  Procedure Laterality Date   CERVICAL DISC SURGERY     x 3   CONVERSION TO TOTAL KNEE Left 10/09/2016   Procedure: Conversion left uni compartment arthroplasty to total knee arthroplasty;  Surgeon: Paralee Cancel, MD;  Location: [facility];  Service: Orthopedics;  Laterality: Left;  90 mins   KNEE ARTHROSCOPY     partial   total left knee arthoplasty     10/09/16 Dr. Alvan Dame  reports that she quit smoking about 53 years ago. Her smoking use included cigarettes. She has a 0.75 pack-year smoking history. She has never used smokeless tobacco. She reports current alcohol use. She reports that she does not use drugs. family history includes Healthy in her daughter, daughter, and daughter; Heart disease in her father; Lung cancer in her brother and sister; Stroke in her mother. Allergies  Allergen Reactions   Cymbalta [Duloxetine Hcl] Other (See Comments)    Feeling weird   Current Outpatient Medications on File Prior to Visit  Medication Sig Dispense Refill   Adalimumab (HUMIRA PEN) 40 MG/0.4ML PNKT Inject 40 mg into the skin every 14 (fourteen) days. 6 each 0   ALPRAZolam (XANAX) 0.25 MG tablet TAKE 1 TABLET BY  MOUTH TWICE DAILY AS NEEDED 60 tablet 2   atorvastatin (LIPITOR) 20 MG tablet Take 1 tablet (20 mg total) by mouth daily. 90 tablet 3   citalopram (CELEXA) 40 MG tablet Take 1 tablet (40 mg total) by mouth daily. 90 tablet 3   gabapentin (NEURONTIN) 300 MG capsule Take 1 capsule (300 mg total) by mouth 2 (two) times daily. 180 capsule 1   hydrochlorothiazide (MICROZIDE) 12.5 MG capsule TAKE 1 CAPSULE(12.5 MG) BY MOUTH DAILY 90 capsule 3   losartan (COZAAR) 100 MG tablet TAKE 1 TABLET BY MOUTH DAILY 90 tablet 3   methotrexate (RHEUMATREX) 2.5 MG tablet TAKE 5 TABLETS (12.5 MG TOTAL) BY MOUTH ONCE A WEEK. CAUTION:CHEMOTHERAPY. PROTECT FROM LIGHT. 60 tablet 0   metoCLOPramide (REGLAN) 10 MG tablet TAKE 1 TABLET BY MOUTH IN THE MORNING AND AT BEDTIME. 180 tablet 0   Multiple Vitamin (MULTIVITAMIN WITH MINERALS) TABS tablet Take 1 tablet by mouth daily.     pantoprazole (PROTONIX) 40 MG tablet TAKE 1 TABLET BY MOUTH TWICE A DAY 180 tablet 3   traMADol (ULTRAM) 50 MG tablet TAKE 1 TABLET BY MOUTH EVERY 6 HOURS AS NEEDED FOR PAIN 120 tablet 2   traZODone (DESYREL) 50 MG tablet TAKE 0.5-1 TABLETS BY MOUTH AT BEDTIME AS NEEDED FOR SLEEP. 90 tablet 1   XALATAN 0.005 % ophthalmic solution Place 1 drop into the right eye at bedtime.      zolpidem (AMBIEN) 10 MG tablet 1 tab by mouth at bedtime as needed 90 tablet 1   No current facility-administered medications on file prior to visit.        ROS:  All others reviewed and negative.  Objective        PE:  BP 126/72 (BP Location: Left Arm, Patient Position: Sitting, Cuff Size: Large)    Pulse 98    Temp 98.6 F (37 C) (Oral)    Ht 5\' 9"  (1.753 m)    Wt 239 lb (108.4 kg)    SpO2 97%    BMI 35.29 kg/m                 Constitutional: Pt appears in NAD               HENT: Head: NCAT.                Right Ear: External ear normal.                 Left Ear: External ear normal.                Eyes: . Pupils are equal, round, and reactive to light.  Conjunctivae and EOM are normal  Nose: without d/c or deformity               Neck: Neck supple. Gross normal ROM               Cardiovascular: Normal rate and regular rhythm.                 Pulmonary/Chest: Effort normal and breath sounds without rales or wheezing.                Abd:  Soft, NT, ND, + BS, no organomegaly               Neurological: Pt is alert. At baseline orientation, motor grossly intact               Skin: Skin is warm. No rashes, no other new lesions, LE edema - none               Psychiatric: Pt behavior is normal without agitation   Micro: none  Cardiac tracings I have personally interpreted today:  none  Pertinent Radiological findings (summarize): none   Lab Results  Component Value Date   WBC 6.2 02/15/2021   HGB 13.1 02/15/2021   HCT 40.0 02/15/2021   PLT 243.0 02/15/2021   GLUCOSE 91 02/15/2021   CHOL 220 (H) 02/15/2021   TRIG 79.0 02/15/2021   HDL 87.80 02/15/2021   LDLDIRECT 109.9 04/03/2012   LDLCALC 117 (H) 02/15/2021   ALT 13 02/15/2021   AST 17 02/15/2021   NA 139 02/15/2021   K 3.8 02/15/2021   CL 102 02/15/2021   CREATININE 0.98 02/15/2021   BUN 15 02/15/2021   CO2 27 02/15/2021   TSH 1.01 02/15/2021   INR 1.0 06/29/2008   HGBA1C 5.6 02/15/2021   Assessment/Plan:  SHERRIKA WEAKLAND is a 66 y.o. Black or African American [2] female with  has a past medical history of ALLERGIC RHINITIS (12/18/2006), Anxiety, Arthritis, DEPRESSION, SITUATIONAL (03/15/2007), GERD (gastroesophageal reflux disease), Headache, History of kidney stones, HYPERLIPIDEMIA (12/18/2006), Hypertension, HYPERTENSION (12/18/2006), INSOMNIA-SLEEP DISORDER-UNSPEC (08/25/2009), Pain in knee region after total knee replacement ([place]) (04/03/2012), Pneumonia, and RENAL CALCULUS, HX OF (12/18/2006).  Encounter for well adult exam with abnormal findings Age and sex appropriate education and counseling updated with regular exercise and diet Referrals for preventative  services - none needed Immunizations addressed - declines pneumovax Smoking counseling  - none needed Evidence for depression or other mood disorder - none significant Most recent labs reviewed. I have personally reviewed and have noted: 1) the patient's medical and social history 2) The patient's current medications and supplements 3) The patient's height, weight, and BMI have been recorded in the chart   Cough Etiology unclear, denies signficiant worsening allergy, asthma or reflux symptoms, will check cxr  GERD without esophagitis Stable, asympt, declines protonix trial  Hyperglycemia Lab Results  Component Value Date   HGBA1C 5.6 02/15/2021   Stable, pt to continue current medical treatment  - diet   Depression Stable overall, cont celexa, declines need for change in tx or referral counseling  HLD (hyperlipidemia) Lab Results  Component Value Date   LDLCALC 117 (H) 02/15/2021   Mild uncontrolled, goal ldl < 100, pt to continue current statin lipitor 20 and encouraged compliance daily, for lower chol diet, declines lab f/u today   Vitamin D deficiency Last vitamin D Lab Results  Component Value Date   VD25OH 37.72 02/15/2021   Low to start oral replacement  Followup: No follow-ups on file.  Cathlean Cower, MD 04/03/2021 12:38 PM [facility]

## 2021-04-03 DIAGNOSIS — E559 Vitamin D deficiency, unspecified: Secondary | ICD-10-CM | POA: Insufficient documentation

## 2021-04-03 NOTE — Assessment & Plan Note (Signed)
Age and sex appropriate education and counseling updated with regular exercise and diet Referrals for preventative services - none needed Immunizations addressed - declines pneumovax Smoking counseling  - none needed Evidence for depression or other mood disorder - none significant Most recent labs reviewed. I have personally reviewed and have noted: 1) the patient's medical and social history 2) The patient's current medications and supplements 3) The patient's height, weight, and BMI have been recorded in the chart

## 2021-04-03 NOTE — Assessment & Plan Note (Signed)
Etiology unclear, denies signficiant worsening allergy, asthma or reflux symptoms, will check cxr

## 2021-04-03 NOTE — Assessment & Plan Note (Signed)
Lab Results  Component Value Date   LDLCALC 117 (H) 02/15/2021   Mild uncontrolled, goal ldl < 100, pt to continue current statin lipitor 20 and encouraged compliance daily, for lower chol diet, declines lab f/u today

## 2021-04-03 NOTE — Assessment & Plan Note (Signed)
Last vitamin D Lab Results  Component Value Date   VD25OH 37.72 02/15/2021   Low to start oral replacement

## 2021-04-03 NOTE — Assessment & Plan Note (Signed)
Lab Results  Component Value Date   HGBA1C 5.6 02/15/2021   Stable, pt to continue current medical treatment  - diet

## 2021-04-03 NOTE — Assessment & Plan Note (Signed)
Stable overall, cont celexa, declines need for change in tx or referral counseling

## 2021-04-03 NOTE — Assessment & Plan Note (Signed)
Stable, asympt, declines protonix trial

## 2021-04-12 ENCOUNTER — Other Ambulatory Visit: Payer: Self-pay | Admitting: Internal Medicine

## 2021-05-07 ENCOUNTER — Other Ambulatory Visit: Payer: Self-pay | Admitting: Internal Medicine

## 2021-05-15 ENCOUNTER — Other Ambulatory Visit: Payer: Self-pay | Admitting: Physician Assistant

## 2021-05-15 ENCOUNTER — Other Ambulatory Visit: Payer: Self-pay | Admitting: Internal Medicine

## 2021-05-21 ENCOUNTER — Other Ambulatory Visit: Payer: Self-pay | Admitting: Physician Assistant

## 2021-05-25 ENCOUNTER — Other Ambulatory Visit: Payer: Self-pay | Admitting: Physician Assistant

## 2021-05-27 ENCOUNTER — Other Ambulatory Visit: Payer: Self-pay | Admitting: Internal Medicine

## 2021-05-27 NOTE — Telephone Encounter (Signed)
[place] for HCT only-  Please refill as per office routine med refill policy (all routine meds to be refilled for 3 mo or monthly (per pt preference) up to one year from last visit, then month to month grace period for 3 mo, then further med refills will have to be denied) ? ?

## 2021-05-30 ENCOUNTER — Other Ambulatory Visit: Payer: Self-pay | Admitting: Physician Assistant

## 2021-06-02 ENCOUNTER — Other Ambulatory Visit: Payer: Self-pay | Admitting: Physician Assistant

## 2021-06-02 ENCOUNTER — Telehealth: Payer: Self-pay | Admitting: Rheumatology

## 2021-06-02 NOTE — Telephone Encounter (Signed)
Pamela [name] from Pamela [name] called the office requesting a refill of Humira '40mg'$ /0.54m for the patient. ?

## 2021-06-03 ENCOUNTER — Telehealth: Payer: Self-pay | Admitting: Physician Assistant

## 2021-06-03 MED ORDER — METOCLOPRAMIDE HCL 10 MG PO TABS: 10.0000 mg | ORAL_TABLET | Freq: Two times a day (BID) | ORAL | 0 refills | Status: DC

## 2021-06-03 NOTE — Progress Notes (Signed)
? ?Office Visit Note ? ?Patient: Pamela [name]             ?Date of Birth: 10/25/1955           ?MRN: [id number]             ?PCP: Biagio Borg, MD ?Referring: Biagio Borg, MD ?Visit Date: 06/07/2021 ?Occupation: '@GUAROCC'$ @ ? ?Subjective:  ?Pain in both knees ? ?History of Present Illness: Pamela [name] is a 66 y.o. female with history of seropositive rheumatoid arthritis and osteoarthritis.  She is prescribed humira 40 mg sq injections every 14 days, methotrexate 5 tablets by mouth once weekly, and folic acid 1 mg daily.  She has been out of methotrexate for the past 2 weeks due to requiring updated lab work and a refill.  She states that she has had some increased joint stiffness and soreness while off of methotrexate.  Her pain has been most severe in both knee joints especially her right knee.  She also has ongoing pain due to trochanteric bursitis of the right hip especially when laying on her right side at night.  She takes tramadol as needed for pain relief.  ? ? ? ?Activities of Daily Living:  ?Patient reports morning stiffness for 30 minutes.   ?Patient Reports nocturnal pain.  ?Difficulty dressing/grooming: Denies ?Difficulty climbing stairs: Reports ?Difficulty getting out of chair: Denies ?Difficulty using hands for taps, buttons, cutlery, and/or writing: Denies ? ?Review of Systems  ?Constitutional:  Positive for fatigue.  ?HENT:  Negative for mouth sores, mouth dryness and nose dryness.   ?Eyes:  Negative for pain, itching and dryness.  ?Respiratory:  Negative for shortness of breath and difficulty breathing.   ?Cardiovascular:  Negative for chest pain and palpitations.  ?Gastrointestinal:  Positive for constipation. Negative for blood in stool and diarrhea.  ?Endocrine: Negative for increased urination.  ?Genitourinary:  Negative for difficulty urinating.  ?Musculoskeletal:  Positive for joint pain, joint pain, myalgias, morning stiffness, muscle tenderness and myalgias. Negative for joint swelling.   ?Skin:  Negative for color change, rash and redness.  ?Allergic/Immunologic: Negative for susceptible to infections.  ?Neurological:  Positive for weakness. Negative for dizziness, numbness, headaches and memory loss.  ?Hematological:  Positive for bruising/bleeding tendency.  ?Psychiatric/Behavioral:  Negative for confusion.   ? ?PMFS History:  ?Patient Active Problem List  ? Diagnosis Date Noted  ? Vitamin D deficiency 04/03/2021  ? Cough 03/29/2021  ? Acute upper respiratory infection 02/15/2021  ? Snoring 02/15/2021  ? Spinal stenosis of lumbar region 08/05/2019  ? Inability to walk 08/05/2019  ? Hives 07/08/2019  ? History of glaucoma 04/24/2019  ? Bursitis of left hip 12/25/2018  ? MGUS (monoclonal gammopathy of unknown significance) 05/11/2018  ? Rheumatoid arthritis involving multiple sites with positive rheumatoid factor ([place]) 05/03/2018  ? High risk medication use 05/03/2018  ? Status post total left knee replacement 05/03/2018  ? DDD (degenerative disc disease), lumbar 05/03/2018  ? Primary osteoarthritis of right knee 04/05/2018  ? Arthritis of left wrist 12/19/2017  ? Left rotator cuff tear 12/19/2017  ? Left wrist pain 12/12/2017  ? Left shoulder pain 08/22/2017  ? Acute pain of right knee 04/17/2017  ? Pain of left calf 04/13/2017  ? S/P left TK revision 10/09/2016  ? Hyperglycemia 09/23/2015  ? Anxiety state 09/23/2015  ? Abnormal urine odor 08/25/2015  ? GERD without esophagitis 08/25/2015  ? Upper abdominal pain 04/16/2015  ? Early satiety 04/16/2015  ? Dysuria 04/16/2015  ? Encounter  for well adult exam with abnormal findings 06/18/2014  ? Greater trochanteric bursitis of right hip 01/06/2013  ? Left lumbar radiculitis 04/03/2012  ? Eczema 04/03/2012  ? 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 BURSITIS, RIGHT HIP 08/25/2009  ? INSOMNIA-SLEEP DISORDER-UNSPEC 08/25/2009  ? FATIGUE 08/25/2009  ? Cervicalgia 08/14/2007  ? Depression 03/15/2007  ? HLD (hyperlipidemia) 12/18/2006  ? Essential hypertension 12/18/2006   ? Allergic rhinitis 12/18/2006  ? RENAL CALCULUS, HX OF 12/18/2006  ?  ?Past Medical History:  ?Diagnosis Date  ? ALLERGIC RHINITIS 12/18/2006  ? Qualifier: Diagnosis of  By: [place], [place]    ? Anxiety   ? Arthritis   ? DEPRESSION, SITUATIONAL 03/15/2007  ? Qualifier: Diagnosis of  By: Wynona Luna   ? GERD (gastroesophageal reflux disease)   ? Headache   ? occasional  ? History of kidney stones   ? HYPERLIPIDEMIA 12/18/2006  ? Qualifier: History of  By: [place], [place]    ? Hypertension   ? HYPERTENSION 12/18/2006  ? Qualifier: Diagnosis of  By: [place], [place]    ? INSOMNIA-SLEEP DISORDER-UNSPEC 08/25/2009  ? Qualifier: Diagnosis of  By: Jenny Reichmann MD, [place]   ? Pain in knee region after total knee replacement ([place]) 04/03/2012  ? Pneumonia   ? RENAL CALCULUS, HX OF 12/18/2006  ? Qualifier: Diagnosis of  By: [place], [place]    ?  ?Family History  ?Problem Relation Age of Onset  ? Stroke Mother   ? Heart disease Father   ? Lung cancer Sister   ? Lung cancer Brother   ? Healthy Daughter   ? Healthy Daughter   ? Healthy Daughter   ? Colon cancer Neg Hx   ? Esophageal cancer Neg Hx   ? Pancreatic cancer Neg Hx   ? Stomach cancer Neg Hx   ? ?Past Surgical History:  ?Procedure Laterality Date  ? CERVICAL DISC SURGERY    ? x 3  ? 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 Procedure: Conversion left uni compartment arthroplasty to total knee arthroplasty;  Surgeon: Paralee Cancel, MD;  Location: [facility];  Service: Orthopedics;  Laterality: Left;  90 mins  ? KNEE ARTHROSCOPY    ? partial  ? total left knee arthoplasty    ? 10/09/16 Dr. Alvan Dame  ? ?Social History  ? ?Social History Narrative  ? Not on file  ? ?Immunization History  ?Administered Date(s) Administered  ? Fluad Quad(high Dose 65+) 04/16/2020, 12/24/2020  ? Influenza Whole 11/12/2012  ? Influenza,inj,Quad PF,6+ Mos 11/29/2016, 12/11/2017, 12/25/2018  ? Influenza-Unspecified 12/15/2013, 12/12/2015, 11/29/2016  ? Td 11/03/2002  ? Tdap 09/23/2015  ?  Zoster Recombinat (Shingrix) 03/22/2018, 08/20/2018  ? Zoster, Live 08/12/2012  ?  ? ?Objective: ?Vital Signs: BP 137/86 (BP Location: Left Arm, Patient Position: Sitting, Cuff Size: Large)   Pulse (!) 101   Ht '5\' 8"'$  (1.727 m)   Wt 233 lb 12.8 oz (106.1 kg)   BMI 35.55 kg/m?   ? ?Physical Exam ?Vitals and nursing note reviewed.  ?Constitutional:   ?   Appearance: She is well-developed.  ?HENT:  ?   Head: Normocephalic and atraumatic.  ?Eyes:  ?   Conjunctiva/sclera: Conjunctivae normal.  ?Cardiovascular:  ?   Rate and Rhythm: Normal rate and regular rhythm.  ?   Heart sounds: Normal heart sounds.  ?Pulmonary:  ?   Effort: Pulmonary effort is normal.  ?   Breath sounds: Normal breath sounds.  ?Abdominal:  ?   General:  Bowel sounds are normal.  ?   Palpations: Abdomen is soft.  ?Musculoskeletal:  ?   Cervical back: Normal range of motion.  ?Skin: ?   General: Skin is warm and dry.  ?   Capillary Refill: Capillary refill takes less than 2 seconds.  ?Neurological:  ?   Mental Status: She is alert and oriented to person, place, and time.  ?Psychiatric:     ?   Behavior: Behavior normal.  ?  ? ?Musculoskeletal Exam: C-spine, thoracic spine, lumbar spine have good range of motion.  No midline spinal tenderness or SI joint tenderness.  Shoulder joints, elbow joints, wrist joints, MCPs, PIPs, DIPs have good range of motion with no synovitis.  Some tenderness over the left wrist joint noted.  Complete fist formation bilaterally.  PIP and DIP thickening consistent with osteoarthritis of both hands.  Hip joints have good range of motion with no groin pain.  Tenderness palpation over the right trochanteric bursa.  Left knee replacement has good range of motion.  Right knee joint has slightly limited extension with discomfort and warmth.  Ankle joints have good range of motion with no tenderness or joint swelling. ? ?CDAI Exam: ?CDAI Score: 1.6  ?Patient Global: 3 mm; Provider Global: 3 mm ?Swollen: 0 ; Tender: 1  ?Joint Exam  06/07/2021  ? ?   Right  Left  ?Knee   Tender     ? ? ? ?Investigation: ?No additional findings. ? ?Imaging: ?No results found. ? ?Recent Labs: ?Lab Results  ?Component Value Date  ? WBC 6.2 02/15/2021

## 2021-06-03 NOTE — Telephone Encounter (Signed)
Refill sent in to pharmacy 

## 2021-06-03 NOTE — Telephone Encounter (Signed)
Inbound call from patient requesting medication refill for Reglan sent to [facility] on [address] scheduled 4/18 ?

## 2021-06-04 ENCOUNTER — Other Ambulatory Visit: Payer: Self-pay | Admitting: Internal Medicine

## 2021-06-07 ENCOUNTER — Other Ambulatory Visit: Payer: Self-pay

## 2021-06-07 ENCOUNTER — Ambulatory Visit (INDEPENDENT_AMBULATORY_CARE_PROVIDER_SITE_OTHER): Payer: Medicare HMO

## 2021-06-07 ENCOUNTER — Ambulatory Visit (INDEPENDENT_AMBULATORY_CARE_PROVIDER_SITE_OTHER): Payer: Medicare HMO | Admitting: Physician Assistant

## 2021-06-07 ENCOUNTER — Encounter: Payer: Self-pay | Admitting: Physician Assistant

## 2021-06-07 VITALS — BP 137/86 | HR 101 | Ht 68.0 in | Wt 233.8 lb

## 2021-06-07 DIAGNOSIS — M25561 Pain in right knee: Secondary | ICD-10-CM

## 2021-06-07 DIAGNOSIS — M0579 Rheumatoid arthritis with rheumatoid factor of multiple sites without organ or systems involvement: Secondary | ICD-10-CM | POA: Diagnosis not present

## 2021-06-07 DIAGNOSIS — S46012D Strain of muscle(s) and tendon(s) of the rotator cuff of left shoulder, subsequent encounter: Secondary | ICD-10-CM

## 2021-06-07 DIAGNOSIS — G8929 Other chronic pain: Secondary | ICD-10-CM | POA: Diagnosis not present

## 2021-06-07 DIAGNOSIS — Z8719 Personal history of other diseases of the digestive system: Secondary | ICD-10-CM

## 2021-06-07 DIAGNOSIS — Z8639 Personal history of other endocrine, nutritional and metabolic disease: Secondary | ICD-10-CM | POA: Diagnosis not present

## 2021-06-07 DIAGNOSIS — M7062 Trochanteric bursitis, left hip: Secondary | ICD-10-CM

## 2021-06-07 DIAGNOSIS — Z8669 Personal history of other diseases of the nervous system and sense organs: Secondary | ICD-10-CM | POA: Diagnosis not present

## 2021-06-07 DIAGNOSIS — Z79899 Other long term (current) drug therapy: Secondary | ICD-10-CM | POA: Diagnosis not present

## 2021-06-07 DIAGNOSIS — M5136 Other intervertebral disc degeneration, lumbar region: Secondary | ICD-10-CM

## 2021-06-07 DIAGNOSIS — M1711 Unilateral primary osteoarthritis, right knee: Secondary | ICD-10-CM

## 2021-06-07 DIAGNOSIS — I10 Essential (primary) hypertension: Secondary | ICD-10-CM

## 2021-06-07 DIAGNOSIS — Z96652 Presence of left artificial knee joint: Secondary | ICD-10-CM

## 2021-06-07 DIAGNOSIS — M7061 Trochanteric bursitis, right hip: Secondary | ICD-10-CM

## 2021-06-07 DIAGNOSIS — E559 Vitamin D deficiency, unspecified: Secondary | ICD-10-CM

## 2021-06-07 DIAGNOSIS — F419 Anxiety disorder, unspecified: Secondary | ICD-10-CM

## 2021-06-07 DIAGNOSIS — R69 Illness, unspecified: Secondary | ICD-10-CM | POA: Diagnosis not present

## 2021-06-07 DIAGNOSIS — F32A Depression, unspecified: Secondary | ICD-10-CM

## 2021-06-07 DIAGNOSIS — R808 Other proteinuria: Secondary | ICD-10-CM

## 2021-06-07 MED ORDER — METHOTREXATE 2.5 MG PO TABS: 12.5000 mg | ORAL_TABLET | ORAL | 0 refills | Status: DC

## 2021-06-07 MED ORDER — HUMIRA (2 PEN) 40 MG/0.4ML ~~LOC~~ AJKT: 40.0000 mg | AUTO-INJECTOR | SUBCUTANEOUS | 0 refills | Status: DC

## 2021-06-07 MED ORDER — LIDOCAINE HCL 1 % IJ SOLN
1.5000 mL | INTRAMUSCULAR | Status: AC | PRN
  Administered 2021-06-07: 1.5 mL

## 2021-06-07 MED ORDER — TRIAMCINOLONE ACETONIDE 40 MG/ML IJ SUSP
40.0000 mg | INTRAMUSCULAR | Status: AC | PRN
  Administered 2021-06-07: 40 mg via INTRA_ARTICULAR

## 2021-06-07 NOTE — Progress Notes (Signed)
X-rays consistent with severe osteoarthritis and severe chondromalacia patella.

## 2021-06-07 NOTE — Patient Instructions (Addendum)
Standing Labs ?We placed an order today for your standing lab work.  ? ?Please have your standing labs drawn at end of June/early July and every 3 months  ? ?If possible, please have your labs drawn 2 weeks prior to your appointment so that the provider can discuss your results at your appointment. ? ?Please note that you may see your imaging and lab results in [place] before we have reviewed them. ?We may be awaiting multiple results to interpret others before contacting you. ?Please allow our office up to 72 hours to thoroughly review all of the results before contacting the office for clarification of your results. ? ?We have open lab daily: ?Monday through Thursday from 1:30-4:30 PM and Friday from 1:30-4:00 PM ?at the office of Dr. Bo Merino, [facility].   ?Please be advised, all patients with office appointments requiring lab work will take precedent over walk-in lab work.  ?If possible, please come for your lab work on Monday and Friday afternoons, as you may experience shorter wait times. ?The office is located at [address], [place], [place], [place] [address] ?No appointment is necessary.   ?Labs are drawn by [facility]. Please bring your co-pay at the time of your lab draw.  You may receive a bill from [facility] for your lab work. ? ?Please note if you are on Hydroxychloroquine and and an order has been placed for a Hydroxychloroquine level, you will need to have it drawn 4 hours or more after your last dose. ? ?If you wish to have your labs drawn at another location, please call the office 24 hours in advance to send orders. ? ?If you have any questions regarding directions or hours of operation,  ?please call [phone].   ?As a reminder, please drink plenty of water prior to coming for your lab work. Thanks! ? ?Hip Bursitis Rehab ?Ask your health care provider which exercises are safe for you. Do exercises exactly as told by your health care provider and adjust them as directed. It  is normal to feel mild stretching, pulling, tightness, or discomfort as you do these exercises. Stop right away if you feel sudden pain or your pain gets worse. Do not begin these exercises until told by your health care provider. ?Stretching exercise ?This exercise warms up your muscles and joints and improves the movement and flexibility of your hip. This exercise also helps to relieve pain and stiffness. ?Iliotibial band stretch ?An iliotibial band is a strong band of muscle tissue that runs from the outer side of your hip to the outer side of your thigh and knee. ?Lie on your side with your left / right leg in the top position. ?Bend your left / right knee and grab your ankle. Stretch out your bottom arm to help you balance. ?Slowly bring your knee back so your thigh is behind your body. ?Slowly lower your knee toward the floor until you feel a gentle stretch on the outside of your left / right thigh. If you do not feel a stretch and your knee will not fall farther, place the heel of your other foot on top of your knee and pull your knee down toward the floor with your foot. ?Hold this position for __________ seconds. ?Slowly return to the starting position. ?Repeat __________ times. Complete this exercise __________ times a day. ?Strengthening exercises ?These exercises build strength and endurance in your hip and pelvis. Endurance is the ability to use your muscles for a long time, even after  they get tired. ?Bridge ?This exercise strengthens the muscles that move your thigh backward (hip extensors). ?Lie on your back on a firm surface with your knees bent and your feet flat on the floor. ?Tighten your buttocks muscles and lift your buttocks off the floor until your trunk is level with your thighs. ?Do not arch your back. ?You should feel the muscles working in your buttocks and the back of your thighs. If you do not feel these muscles, slide your feet 1-2 inches (2.5-5 cm) farther away from your buttocks. ?If  this exercise is too easy, try doing it with your arms crossed over your chest. ?Hold this position for __________ seconds. ?Slowly lower your hips to the starting position. ?Let your muscles relax completely after each repetition. ?Repeat __________ times. Complete this exercise __________ times a day. ?Squats ?This exercise strengthens the muscles in front of your thigh and knee (quadriceps). ?Stand in front of a table, with your feet and knees pointing straight ahead. You may rest your hands on the table for balance but not for support. ?Slowly bend your knees and lower your hips like you are going to sit in a chair. ?Keep your weight over your heels, not over your toes. ?Keep your lower legs upright so they are parallel with the table legs. ?Do not let your hips go lower than your knees. ?Do not bend lower than told by your health care provider. ?If your hip pain increases, do not bend as low. ?Hold the squat position for __________ seconds. ?Slowly push with your legs to return to standing. Do not use your hands to pull yourself to standing. ?Repeat __________ times. Complete this exercise __________ times a day. ?Hip hike ?Stand sideways on a bottom step. Stand on your left / right leg with your other foot unsupported next to the step. You can hold on to the railing or wall for balance if needed. ?Keep your knees straight and your torso square. Then lift your left / right hip up toward the ceiling. ?Hold this position for __________ seconds. ?Slowly let your left / right hip lower toward the floor, past the starting position. Your foot should get closer to the floor. Do not lean or bend your knees. ?Repeat __________ times. Complete this exercise __________ times a day. ?Single leg stand ?Without shoes, stand near a railing or in a doorway. You may hold on to the railing or door frame as needed for balance. ?Squeeze your left / right buttock muscles, then lift up your other foot. ?Do not let your left / right  hip push out to the side. ?It is helpful to stand in front of a mirror for this exercise so you can watch your hip. ?Hold this position for __________ seconds. ?Repeat __________ times. Complete this exercise __________ times a day. ?This information is not intended to replace advice given to you by your health care provider. Make sure you discuss any questions you have with your health care provider. ?Document Revised: 06/24/2018 Document Reviewed: 06/24/2018 ?Elsevier Patient Education ? [place]. ? ? ?Knee Exercises ?Ask your health care provider which exercises are safe for you. Do exercises exactly as told by your health care provider and adjust them as directed. It is normal to feel mild stretching, pulling, tightness, or discomfort as you do these exercises. Stop right away if you feel sudden pain or your pain gets worse. Do not begin these exercises until told by your health care provider. ?Stretching and range-of-motion exercises ?These  exercises warm up your muscles and joints and improve the movement and flexibility of your knee. These exercises also help to relieve pain and swelling. ?Knee extension, prone ? ?Lie on your abdomen (prone position) on a bed. ?Place your left / right knee just beyond the edge of the surface so your knee is not on the bed. You can put a towel under your left / right thigh just above your kneecap for comfort. ?Relax your leg muscles and allow gravity to straighten your knee (extension). You should feel a stretch behind your left / right knee. ?Hold this position for __________ seconds. ?Scoot up so your knee is supported between repetitions. ?Repeat __________ times. Complete this exercise __________ times a day. ?Knee flexion, active ? ?Lie on your back with both legs straight. If this causes back discomfort, bend your left / right knee so your foot is flat on the floor. ?Slowly slide your left / right heel back toward your buttocks. Stop when you feel a gentle  stretch in the front of your knee or thigh (flexion). ?Hold this position for __________ seconds. ?Slowly slide your left / right heel back to the starting position. ?Repeat __________ times. Complete thi

## 2021-06-08 LAB — CBC WITH DIFFERENTIAL/PLATELET
Absolute Monocytes: 660 cells/uL (ref 200–950)
Basophils Absolute: 82 cells/uL (ref 0–200)
Basophils Relative: 1.2 %
Eosinophils Absolute: 143 cells/uL (ref 15–500)
Eosinophils Relative: 2.1 %
HCT: 36.1 % (ref 35.0–45.0)
Hemoglobin: 12.2 g/dL (ref 11.7–15.5)
Lymphs Abs: 1020 cells/uL (ref 850–3900)
MCH: 29 pg (ref 27.0–33.0)
MCHC: 33.8 g/dL (ref 32.0–36.0)
MCV: 85.7 fL (ref 80.0–100.0)
MPV: 11.6 fL (ref 7.5–12.5)
Monocytes Relative: 9.7 %
Neutro Abs: 4896 cells/uL (ref 1500–7800)
Neutrophils Relative %: 72 %
Platelets: 230 10*3/uL (ref 140–400)
RBC: 4.21 10*6/uL (ref 3.80–5.10)
RDW: 12.9 % (ref 11.0–15.0)
Total Lymphocyte: 15 %
WBC: 6.8 10*3/uL (ref 3.8–10.8)

## 2021-06-08 LAB — COMPLETE METABOLIC PANEL WITH GFR
AG Ratio: 1.6 (calc) (ref 1.0–2.5)
ALT: 18 U/L (ref 6–29)
AST: 18 U/L (ref 10–35)
Albumin: 4.5 g/dL (ref 3.6–5.1)
Alkaline phosphatase (APISO): 51 U/L (ref 37–153)
BUN: 12 mg/dL (ref 7–25)
CO2: 28 mmol/L (ref 20–32)
Calcium: 9.9 mg/dL (ref 8.6–10.4)
Chloride: 104 mmol/L (ref 98–110)
Creat: 0.99 mg/dL (ref 0.50–1.05)
Globulin: 2.8 g/dL (calc) (ref 1.9–3.7)
Glucose, Bld: 99 mg/dL (ref 65–99)
Potassium: 3.7 mmol/L (ref 3.5–5.3)
Sodium: 142 mmol/L (ref 135–146)
Total Bilirubin: 0.7 mg/dL (ref 0.2–1.2)
Total Protein: 7.3 g/dL (ref 6.1–8.1)
eGFR: 63 mL/min/{1.73_m2} (ref >=60)

## 2021-06-08 NOTE — Progress Notes (Signed)
CBC and CMP WNL

## 2021-06-20 ENCOUNTER — Other Ambulatory Visit: Payer: Self-pay | Admitting: Physician Assistant

## 2021-06-27 ENCOUNTER — Other Ambulatory Visit: Payer: Self-pay | Admitting: Internal Medicine

## 2021-06-28 ENCOUNTER — Ambulatory Visit (INDEPENDENT_AMBULATORY_CARE_PROVIDER_SITE_OTHER): Payer: Medicare HMO | Admitting: Physician Assistant

## 2021-06-28 ENCOUNTER — Encounter: Payer: Self-pay | Admitting: Physician Assistant

## 2021-06-28 VITALS — BP 126/84 | HR 81 | Ht 69.0 in | Wt 237.4 lb

## 2021-06-28 DIAGNOSIS — K219 Gastro-esophageal reflux disease without esophagitis: Secondary | ICD-10-CM

## 2021-06-28 DIAGNOSIS — K3184 Gastroparesis: Secondary | ICD-10-CM | POA: Diagnosis not present

## 2021-06-28 MED ORDER — METOCLOPRAMIDE HCL 10 MG PO TABS: 10.0000 mg | ORAL_TABLET | Freq: Two times a day (BID) | ORAL | 3 refills | Status: DC

## 2021-06-28 MED ORDER — PANTOPRAZOLE SODIUM 40 MG PO TBEC: 40.0000 mg | DELAYED_RELEASE_TABLET | Freq: Two times a day (BID) | ORAL | 3 refills | Status: DC

## 2021-06-28 NOTE — Patient Instructions (Addendum)
We have sent the following medications to your pharmacy for you to pick up at your convenience: Pantoprazole and Reglan.  ? ?Follow up in 2 years or sooner if needed. ? ?If you are age 67 or older, your body mass index should be between 23-30. Your Body mass index is 35.06 kg/m?Marland Kitchen If this is out of the aforementioned range listed, please consider follow up with your Primary Care Provider. ? ?If you are age 19 or younger, your body mass index should be between 19-25. Your Body mass index is 35.06 kg/m?Marland Kitchen If this is out of the aformentioned range listed, please consider follow up with your Primary Care Provider.  ? ?________________________________________________________ ? ?The [facility] providers would like to encourage you to use [facility] to communicate with providers for non-urgent requests or questions.  Due to long hold times on the telephone, sending your provider a message by [facility] may be a faster and more efficient way to get a response.  Please allow 48 business hours for a response.  Please remember that this is for non-urgent requests.  ?_______________________________________________________ ? ?

## 2021-06-28 NOTE — Progress Notes (Signed)
I agree with the above note, plan 

## 2021-06-28 NOTE — Progress Notes (Signed)
? ?Chief Complaint: Medication refill ? ?Review of pertinent gastrointestinal problems: ?1. Routine risk for colon cancer:  Routine screening colonoscopy 02/2007 Dr. Ardis Hughs found single small HP polyp, was recommended to have repeat screening in 10 years. Colonoscopy 10/2015 was normal. ?2. Dysphagia; EGD 04/2015 Dr. Ardis Hughs found thin Schatzki's ring (dilated to 95m), also retained food in stomach, gastritis (H. Pylori neg by biopsy).   ?3. 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 ?4. Early satiety, weight loss: 2017 workup; CT scan abd/pelv 09/2015 essentially normal.   ?  ?  ?HPI: ?   Pamela [name] a 66year old female with a past medical history as listed below including gastroparesis, known to Dr. JArdis Hughs who presents to clinic today for a refill of her medications. ?   01/29/2020 patient seen in clinic for follow-up of gastroparesis.  At that time continued with chronic complaints of coughing and gagging on mucus.  She was on Pantoprazole 40 every morning but symptoms occurred before she took that medicine.  She was still on Reglan 10 mg twice a day and felt like it helped.  Also issues with constipation.  That time refilled Reglan 10 mg twice daily and increase Pantoprazole to 40 twice daily as well as started MiraLAX for constipation. ?   Today, the patient tells me that as long as she takes her Reglan 10 mg twice a day and Pantoprazole 40 mg twice a day she has mostly good days with her stomach.  Tells me that she feels good about 80% of the time.  Occasionally she will have a flare of "stomach symptoms", which she describes as epigastric pain and some indigestion, but these usually resolve within the day.  She is happy with her current medication regimen and would like to stay on it. ?   Denies fever, chills, weight loss, change in bowel habits, nausea or  vomiting. ? ?Past Medical History:  ?Diagnosis Date  ? ALLERGIC RHINITIS 12/18/2006  ? Qualifier: Diagnosis of  By: [place]   ? Anxiety   ? Arthritis   ? DEPRESSION, SITUATIONAL 03/15/2007  ? Qualifier: Diagnosis of  By: YWynona Luna  ? GERD (gastroesophageal reflux disease)   ? Headache   ? occasional  ? History of kidney stones   ? HYPERLIPIDEMIA 12/18/2006  ? Qualifier: History of  By: [place]   ? Hypertension   ? HYPERTENSION 12/18/2006  ? Qualifier: Diagnosis of  By: [place]   ? INSOMNIA-SLEEP DISORDER-UNSPEC 08/25/2009  ? Qualifier: Diagnosis of  By: JJenny ReichmannMD, JHunt Oris  ? Pain in knee region [place]  ? Pneumonia   ? RENAL CALCULUS, HX OF 12/18/2006  ? Qualifier: Diagnosis of  By: [place]   ? ? ?Past Surgical History:  ?Procedure Laterality Date  ? CERVICAL DISC SURGERY    ? x 3  ? 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 Procedure: Conversion left uni compartment arthroplasty to total knee arthroplasty;  Surgeon: OParalee Cancel MD;  Location: [facility];  Service: Orthopedics;  Laterality: Left;  90 mins  ? KNEE ARTHROSCOPY    ? partial  ? total left knee arthoplasty    ? 10/09/16 Dr. OAlvan Dame ? ? ?Current Outpatient Medications  ?Medication Sig Dispense Refill  ? sertraline (ZOLOFT) 100 MG tablet TAKE 1 TABLET  BY MOUTH EVERY DAY (Patient not taking: Reported on 06/07/2021) 90 tablet 3  ? Adalimumab (HUMIRA PEN) 40 MG/0.4ML PNKT Inject 40 mg into the skin every 14 (fourteen) days. 6 each 0  ? ALPRAZolam (XANAX) 0.25 MG tablet TAKE 1 TABLET BY MOUTH TWICE A DAY AS NEEDED 60 tablet 2  ? atorvastatin (LIPITOR) 20 MG tablet Take 1 tablet (20 mg total) by mouth daily. 90 tablet 3  ? citalopram (CELEXA) 40 MG tablet Take 1 tablet (40 mg total) by mouth daily. 90 tablet 3  ? gabapentin (NEURONTIN) 300 MG capsule Take 1 capsule (300 mg total) by mouth 2 (two) times daily. 180 capsule 1  ? hydrochlorothiazide (MICROZIDE) 12.5 MG  capsule TAKE 1 CAPSULE(12.5 MG) BY MOUTH DAILY 90 capsule 3  ? losartan (COZAAR) 100 MG tablet TAKE 1 TABLET BY MOUTH EVERY DAY 90 tablet 3  ? methotrexate (RHEUMATREX) 2.5 MG tablet Take 5 tablets (12.5 mg total) by mouth once a week. Caution:Chemotherapy. Protect from light. 60 tablet 0  ? metoCLOPramide (REGLAN) 10 MG tablet TAKE 1 TABLET BY MOUTH TWICE A DAY 60 tablet 0  ? Multiple Vitamin (MULTIVITAMIN WITH MINERALS) TABS tablet Take 1 tablet by mouth daily.    ? pantoprazole (PROTONIX) 40 MG tablet TAKE 1 TABLET BY MOUTH TWICE A DAY 180 tablet 1  ? traMADol (ULTRAM) 50 MG tablet TAKE 1 TABLET BY MOUTH EVERY 6 HOURS AS NEEDED FOR PAIN 120 tablet 2  ? traZODone (DESYREL) 50 MG tablet TAKE 1/2 TO 1 TABLET BY MOUTH AT BEDTIME AS NEEDED FOR SLEEP 90 tablet 1  ? XALATAN 0.005 % ophthalmic solution Place 1 drop into the right eye at bedtime.     ? zolpidem (AMBIEN) 10 MG tablet TAKE 1 TABLET BY MOUTH AT BEDTIME AS NEEDED 90 tablet 1  ? ?No current facility-administered medications for this visit.  ? ? ?Allergies as of 06/28/2021 - Review Complete 06/07/2021  ?Allergen Reaction Noted  ? Cymbalta [duloxetine hcl] Other (See Comments) 07/08/2019  ? ? ?Family History  ?Problem Relation Age of Onset  ? Stroke Mother   ? Heart disease Father   ? Lung cancer Sister   ? Lung cancer Brother   ? Healthy Daughter   ? Healthy Daughter   ? Healthy Daughter   ? Colon cancer Neg Hx   ? Esophageal cancer Neg Hx   ? Pancreatic cancer Neg Hx   ? Stomach cancer Neg Hx   ? ? ?Social History  ? ?Socioeconomic History  ? Marital status: Married  ?  Spouse name: Not on file  ? Number of children: 3  ? Years of education: Not on file  ? Highest education level: Not on file  ?Occupational History  ? Not on file  ?Tobacco Use  ? Smoking status: Former  ?  Packs/day: 0.25  ?  Years: 3.00  ?  Pack years: 0.75  ?  Types: Cigarettes  ?  Quit date: 1970  ?  Years since quitting: 53.3  ?  Passive exposure: Never  ? Smokeless tobacco: Never  ?  Tobacco comments:  ?  1970's  ?Vaping Use  ? Vaping Use: Never used  ?Substance and Sexual Activity  ? Alcohol use: Yes  ?  Alcohol/week: 0.0 standard drinks  ?  Comment: occ  ? Drug use: No  ? Sexual activity: Yes  ?Other Topics Concern  ? Not on file  ?Social History Narrative  ? Not on file  ? ?Social Determinants of Health  ? ?  Financial Resource Strain: Not on file  ?Food Insecurity: Not on file  ?Transportation Needs: Not on file  ?Physical Activity: Not on file  ?Stress: Not on file  ?Social Connections: Not on file  ?Intimate Partner Violence: Not on file  ? ? ?Review of Systems:    ?Constitutional: No weight loss, fever or chills ?Cardiovascular: No chest pain ?Respiratory: No SOB  ?Gastrointestinal: See HPI and otherwise negative ? ? Physical Exam:  ?Vital signs: ?BP 126/84   Pulse 81   Ht '5\' 9"'$  (1.753 m)   Wt 237 lb 6.4 oz (107.7 kg)   BMI 35.06 kg/m?   ? ?Constitutional:   Pleasant obese AA female appears to be in NAD, Well developed, Well nourished, alert and cooperative ?Respiratory: Respirations even and unlabored. Lungs clear to auscultation bilaterally.   No wheezes, crackles, or rhonchi.  ?Cardiovascular: Normal S1, S2. No MRG. Regular rate and rhythm. No peripheral edema, cyanosis or pallor.  ?Gastrointestinal:  Soft, nondistended, mild epigastric ttp, No rebound or guarding. Normal bowel sounds. No appreciable masses or hepatomegaly. ?Rectal:  Not performed.  ?Psychiatric: Oriented to person, place and time. Demonstrates good judgement and reason without abnormal affect or behaviors. ? ?RELEVANT LABS AND IMAGING: ?CBC ?   ?Component Value Date/Time  ? WBC 6.8 06/07/2021 1021  ? RBC 4.21 06/07/2021 1021  ? HGB 12.2 06/07/2021 1021  ? HGB 12.6 12/09/2019 1401  ? HCT 36.1 06/07/2021 1021  ? PLT 230 06/07/2021 1021  ? PLT 250 12/09/2019 1401  ? MCV 85.7 06/07/2021 1021  ? MCH 29.0 06/07/2021 1021  ? MCHC 33.8 06/07/2021 1021  ? RDW 12.9 06/07/2021 1021  ? LYMPHSABS 1,020 06/07/2021 1021  ? MONOABS  0.9 02/15/2021 1152  ? EOSABS 143 06/07/2021 1021  ? BASOSABS 82 06/07/2021 1021  ? ? ?CMP  ?   ?Component Value Date/Time  ? NA 142 06/07/2021 1021  ? K 3.7 06/07/2021 1021  ? CL 104 06/07/2021 1021  ? CO2 28 06/07/2021 1

## 2021-07-11 DIAGNOSIS — H524 Presbyopia: Secondary | ICD-10-CM | POA: Diagnosis not present

## 2021-07-11 DIAGNOSIS — H16223 Keratoconjunctivitis sicca, not specified as Sjogren's, bilateral: Secondary | ICD-10-CM | POA: Diagnosis not present

## 2021-07-11 DIAGNOSIS — H5213 Myopia, bilateral: Secondary | ICD-10-CM | POA: Diagnosis not present

## 2021-07-11 DIAGNOSIS — H52223 Regular astigmatism, bilateral: Secondary | ICD-10-CM | POA: Diagnosis not present

## 2021-07-11 DIAGNOSIS — H40013 Open angle with borderline findings, low risk, bilateral: Secondary | ICD-10-CM | POA: Diagnosis not present

## 2021-07-11 DIAGNOSIS — H25813 Combined forms of age-related cataract, bilateral: Secondary | ICD-10-CM | POA: Diagnosis not present

## 2021-07-15 DIAGNOSIS — R0683 Snoring: Secondary | ICD-10-CM | POA: Diagnosis not present

## 2021-08-03 ENCOUNTER — Telehealth: Payer: Self-pay | Admitting: Internal Medicine

## 2021-08-03 NOTE — Telephone Encounter (Signed)
LVM for pt to rtn my call to schedule AWV-I with [facility]. Call back # [phone]

## 2021-08-10 ENCOUNTER — Ambulatory Visit (INDEPENDENT_AMBULATORY_CARE_PROVIDER_SITE_OTHER): Payer: Medicare HMO

## 2021-08-10 ENCOUNTER — Other Ambulatory Visit: Payer: Self-pay | Admitting: Internal Medicine

## 2021-08-10 VITALS — BP 124/78 | HR 106 | Temp 98.1°F | Resp 18 | Ht 69.0 in | Wt 240.0 lb

## 2021-08-10 DIAGNOSIS — Z0000 Encounter for general adult medical examination without abnormal findings: Secondary | ICD-10-CM

## 2021-08-10 NOTE — Progress Notes (Signed)
Subjective:   Pamela [name] is a 66 y.o. female who presents for Medicare Annual (Subsequent) preventive examination.  Review of Systems           Objective:    Today's Vitals   08/10/21 1345  BP: 124/78  Pulse: (!) 106  Resp: 18  Temp: 98.1 F (36.7 C)  TempSrc: Oral  SpO2: 90%  Weight: 240 lb (108.9 kg)  Height: '5\' 9"'$  (1.753 m)   Body mass index is 35.44 kg/m.     12/16/2019    2:41 PM 08/04/2019    7:45 PM 10/14/2017    1:41 PM 10/09/2016    5:45 PM 10/09/2016   10:09 AM 10/03/2016    9:53 AM  Advanced Directives  Does Patient Have a Medical Advance Directive? No No No No No No  Does patient want to make changes to medical advance directive? No - Patient declined       Would patient like information on creating a medical advance directive? No - Patient declined   No - Patient declined No - Patient declined Yes (MAU/Ambulatory/Procedural Areas - Information given)    Current Medications (verified) Outpatient Encounter Medications as of 08/10/2021  Medication Sig   Adalimumab (HUMIRA PEN) 40 MG/0.4ML PNKT Inject 40 mg into the skin every 14 (fourteen) days.   ALPRAZolam (XANAX) 0.25 MG tablet TAKE 1 TABLET BY MOUTH TWICE A DAY AS NEEDED   atorvastatin (LIPITOR) 20 MG tablet Take 1 tablet (20 mg total) by mouth daily.   citalopram (CELEXA) 40 MG tablet Take 1 tablet (40 mg total) by mouth daily.   gabapentin (NEURONTIN) 300 MG capsule Take 1 capsule (300 mg total) by mouth 2 (two) times daily.   hydrochlorothiazide (MICROZIDE) 12.5 MG capsule TAKE 1 CAPSULE(12.5 MG) BY MOUTH DAILY   losartan (COZAAR) 100 MG tablet TAKE 1 TABLET BY MOUTH EVERY DAY   methotrexate (RHEUMATREX) 2.5 MG tablet Take 5 tablets (12.5 mg total) by mouth once a week. Caution:Chemotherapy. Protect from light.   metoCLOPramide (REGLAN) 10 MG tablet Take 1 tablet (10 mg total) by mouth 2 (two) times daily.   Multiple Vitamin (MULTIVITAMIN WITH MINERALS) TABS tablet Take 1 tablet by mouth  daily.   pantoprazole (PROTONIX) 40 MG tablet Take 1 tablet (40 mg total) by mouth 2 (two) times daily.   sertraline (ZOLOFT) 100 MG tablet TAKE 1 TABLET BY MOUTH EVERY DAY   traMADol (ULTRAM) 50 MG tablet TAKE 1 TABLET BY MOUTH EVERY 6 HOURS AS NEEDED FOR PAIN   traZODone (DESYREL) 50 MG tablet TAKE 1/2 TO 1 TABLET BY MOUTH AT BEDTIME AS NEEDED FOR SLEEP   XALATAN 0.005 % ophthalmic solution Place 1 drop into the right eye at bedtime.    zolpidem (AMBIEN) 10 MG tablet TAKE 1 TABLET BY MOUTH AT BEDTIME AS NEEDED   No facility-administered encounter medications on file as of 08/10/2021.    Allergies (verified) Cymbalta [duloxetine hcl]   History: Past Medical History:  Diagnosis Date   ALLERGIC RHINITIS 12/18/2006   Qualifier: Diagnosis of  By: Danny Lawless CMA, [place]     Anxiety    Arthritis    DEPRESSION, SITUATIONAL 03/15/2007   Qualifier: Diagnosis of  By: Wynona Luna    GERD (gastroesophageal reflux disease)    Headache    occasional   History of kidney stones    HYPERLIPIDEMIA 12/18/2006   Qualifier: History of  By: [place], [place]     Hypertension  HYPERTENSION 12/18/2006   Qualifier: Diagnosis of  By: [place], [place]     INSOMNIA-SLEEP [place] 08/25/2009   Qualifier: Diagnosis of  By: Jenny Reichmann MD, Hunt Oris    Pain in knee region after total knee replacement ([place]) 04/03/2012   Pneumonia    RENAL CALCULUS, HX OF 12/18/2006   Qualifier: Diagnosis of  By: [place], [place]     Past Surgical History:  Procedure Laterality Date   CERVICAL DISC SURGERY     x 3   CONVERSION TO TOTAL KNEE Left 10/09/2016   Procedure: Conversion left uni compartment arthroplasty to total knee arthroplasty;  Surgeon: Paralee Cancel, MD;  Location: [facility];  Service: Orthopedics;  Laterality: Left;  90 mins   KNEE ARTHROSCOPY     partial   total left knee arthoplasty     10/09/16 Dr. Alvan Dame   Family History  Problem Relation Age of Onset   Stroke Mother    Heart disease  Father    Lung cancer Sister    Lung cancer Brother    Healthy Daughter    Healthy Daughter    Healthy Daughter    Colon cancer Neg Hx    Esophageal cancer Neg Hx    Pancreatic cancer Neg Hx    Stomach cancer Neg Hx    Social History   Socioeconomic History   Marital status: Married    Spouse name: Not on file   Number of children: 3   Years of education: Not on file   Highest education level: Not on file  Occupational History   Not on file  Tobacco Use   Smoking status: Former    Packs/day: 0.25    Years: 3.00    Pack years: 0.75    Types: Cigarettes    Quit date: 1970    Years since quitting: 53.4    Passive exposure: Never   Smokeless tobacco: Never   Tobacco comments:    1970's  Vaping Use   Vaping Use: Never used  Substance and Sexual Activity   Alcohol use: Not Currently    Comment: occ   Drug use: No   Sexual activity: Yes  Other Topics Concern   Not on file  Social History Narrative   Not on file   Social Determinants of Health   Financial Resource Strain: Low Risk    Difficulty of Paying Living Expenses: Not hard at all  Food Insecurity: No Food Insecurity   Worried About Charity fundraiser in the Last Year: Never true   [place] in the Last Year: Never true  Transportation Needs: No Transportation Needs   Lack of Transportation (Medical): No   Lack of Transportation (Non-Medical): No  Physical Activity: Insufficiently Active   Days of Exercise per Week: 5 days   Minutes of Exercise per Session: 10 min  Stress: No Stress Concern Present   Feeling of Stress : Not at all  Social Connections: Socially Integrated   Frequency of Communication with Friends and Family: More than three times a week   Frequency of Social Gatherings with Friends and Family: Once a week   Attends Religious Services: More than 4 times per year   Active Member of [employer] or Organizations: Yes   Attends Archivist Meetings: More than 4 times per year    Marital Status: Married    Tobacco Counseling Counseling given: Not Answered Tobacco comments: 1970's   Clinical Intake:  Diabetic?No         Activities of Daily Living    08/10/2021    1:41 PM  In your present state of health, do you have any difficulty performing the following activities:  Hearing? 0  Vision? 0  Difficulty concentrating or making decisions? 0  Walking or climbing stairs? 0  Dressing or bathing? 0  Doing errands, shopping? 0    Patient Care Team: Biagio Borg, MD as PCP - General (Internal Medicine)  Indicate any recent Medical Services you may have received from other than [facility] providers in the past year (date may be approximate).     Assessment:   This is a routine wellness examination for Eudelia.  Hearing/Vision screen No results found.  Dietary issues and exercise activities discussed:     Goals Addressed   None   Depression Screen    08/10/2021    1:46 PM 12/24/2020   11:43 AM 07/08/2019   11:39 AM 08/22/2017   10:40 AM 06/01/2016    8:33 AM 09/23/2015    9:54 AM  PHQ 2/9 Scores  PHQ - 2 Score '2 6 4 2 '$ 0 1  PHQ- 9 Score '2 20 10 15      '$ Fall Risk    08/10/2021    1:49 PM 12/24/2020   11:44 AM 07/08/2019   11:38 AM 08/22/2017   10:40 AM 06/01/2016    8:33 AM  Fall Risk   Falls in the past year? 0 1 1 No No  Number falls in past yr: 0 0 0    Injury with Fall? 0 0 1    Risk for fall due to :   History of fall(s);Impaired balance/gait    Follow up   Falls evaluation completed      FALL RISK PREVENTION PERTAINING TO THE HOME:  Any stairs in or around the home? No  If so, are there any without handrails? No  Home free of loose throw rugs in walkways, pet beds, electrical cords, etc? Yes  Adequate lighting in your home to reduce risk of falls? Yes   ASSISTIVE DEVICES UTILIZED TO PREVENT FALLS:  Life alert? No  Use of a cane, walker or w/c? No  Grab bars in the bathroom? No  Shower chair or bench in  shower? No  Elevated toilet seat or a handicapped toilet? No   TIMED UP AND GO:  Was the test performed? No .  Length of time to ambulate 10 feet N/A   Gait steady and fast without use of assistive device  Cognitive Function:        08/10/2021    1:49 PM  6CIT Screen  What Year? 0 points  What month? 0 points  What time? 3 points  Count back from 20 0 points  Months in reverse 4 points  Repeat phrase 2 points  Total Score 9 points    Immunizations Immunization History  Administered Date(s) Administered   Fluad Quad(high Dose 65+) 04/16/2020, 12/24/2020   Influenza Whole 11/12/2012   Influenza,inj,Quad PF,6+ Mos 11/29/2016, 12/11/2017, 12/25/2018   Influenza-Unspecified 12/15/2013, 12/12/2015, 11/29/2016   Td 11/03/2002   Tdap 09/23/2015   Zoster Recombinat (Shingrix) 03/22/2018, 08/20/2018   Zoster, Live 08/12/2012    TDAP status: Up to date  Flu Vaccine status: Up to date  Pneumococcal vaccine status: Due, Education has been provided regarding the importance of this vaccine. Advised may receive this vaccine at local pharmacy or Health Dept. Aware to provide a copy of the vaccination  record if obtained from local pharmacy or Health Dept. Verbalized acceptance and understanding.  Covid-19 vaccine status: Information provided on how to obtain vaccines.   Qualifies for Shingles Vaccine? Yes   Zostavax completed Yes   Shingrix Completed?: Yes  Screening Tests Health Maintenance  Topic Date Due   Pneumonia Vaccine 46+ Years old (1 - PCV) 02/15/2022 (Originally 08/14/2019)   INFLUENZA VACCINE  10/11/2021   MAMMOGRAM  02/03/2023   TETANUS/TDAP  09/22/2025   COLONOSCOPY (Pts 45-36yr Insurance coverage will need to be confirmed)  11/07/2025   DEXA SCAN  Completed   Hepatitis C Screening  Completed   Zoster Vaccines- Shingrix  Completed   HPV VACCINES  Aged Out   COVID-19 Vaccine  Discontinued    Health Maintenance  There are no preventive care reminders to  display for this patient.  Colorectal cancer screening: Type of screening: Colonoscopy. Completed 11/08/2015. Repeat every 10 years  Mammogram completed on 02/02/2021 and should be repeated every 2 years  Bone Density status: Completed 01/20/2020. Results reflect: Bone density results: NORMAL. Repeat every 5 years.  Lung Cancer Screening: (Low Dose CT Chest recommended if Age 532-80years, 30 pack-year currently smoking OR have quit w/in 15years.) does not qualify.   Lung Cancer Screening Referral:   Additional Screening:  Hepatitis C Screening: does qualify; Completed 04/26/2018  Vision Screening: Recommended annual ophthalmology exams for early detection of glaucoma and other disorders of the eye. Is the patient up to date with their annual eye exam?  Yes  Who is the provider or what is the name of the office in which the patient attends annual [facility] exams[facility] If pt is not established with a provider, would they like to be referred to a provider to establish care? Yes .   Dental Screening: Recommended annual dental exams for proper oral hygiene  Community Resource Referral / Chronic Care Management: CRR required this visit?  No   CCM required this visit?  No      Plan:     I have personally reviewed and noted the following in the patient's chart:   Medical and social history Use of alcohol, tobacco or illicit drugs  Current medications and supplements including opioid prescriptions.  Functional ability and status Nutritional status Physical activity Advanced directives List of other physicians Hospitalizations, surgeries, and ER visits in previous 12 months Vitals Screenings to include cognitive, depression, and falls Referrals and appointments  In addition, I have reviewed and discussed with patient certain preventive protocols, quality metrics, and best practice recommendations. A written personalized care plan for preventive services as well as general  preventive health recommendations were provided to patient.     [place]  08/10/2021   Nurse Notes: patient has no concerns. She was given information about Advanced Directives so that we can update her chart. She was advised if anything changes to give our office a call.

## 2021-08-11 ENCOUNTER — Other Ambulatory Visit: Payer: Self-pay | Admitting: Physician Assistant

## 2021-08-11 NOTE — Telephone Encounter (Signed)
Next Visit: 11/10/2021  Last Visit: 06/07/2021  Last Fill:  06/07/2021  DX: Rheumatoid arthritis involving multiple sites with positive rheumatoid factor   Current Dose per office note 06/07/2021: Methotrexate 5 tablets by mouth once weekly  Labs: 06/07/2021, CBC and CMP WNL  Okay to refill MTX?

## 2021-08-16 ENCOUNTER — Ambulatory Visit: Payer: Medicare HMO | Admitting: Internal Medicine

## 2021-08-24 ENCOUNTER — Other Ambulatory Visit: Payer: Self-pay | Admitting: *Deleted

## 2021-08-24 DIAGNOSIS — M0579 Rheumatoid arthritis with rheumatoid factor of multiple sites without organ or systems involvement: Secondary | ICD-10-CM

## 2021-08-24 MED ORDER — HUMIRA (2 PEN) 40 MG/0.4ML ~~LOC~~ AJKT: 40.0000 mg | AUTO-INJECTOR | SUBCUTANEOUS | 0 refills | Status: DC

## 2021-08-24 NOTE — Telephone Encounter (Signed)
Next Visit: 11/10/2021  Last Visit: 06/07/2021  Last Fill: 06/07/2021  DX: Rheumatoid arthritis involving multiple sites with positive rheumatoid factor   Current Dose per office note 06/07/2021: Humira 40 mg sq injections every 14 days  Labs: 06/07/2021, CBC and CMP WNL  TB Gold: 10/15/2020 negative   Okay to refill Humira?

## 2021-08-31 ENCOUNTER — Encounter: Payer: Self-pay | Admitting: Internal Medicine

## 2021-08-31 ENCOUNTER — Ambulatory Visit (INDEPENDENT_AMBULATORY_CARE_PROVIDER_SITE_OTHER): Payer: Medicare HMO | Admitting: Internal Medicine

## 2021-08-31 VITALS — BP 136/68 | HR 87 | Ht 69.0 in | Wt 238.0 lb

## 2021-08-31 DIAGNOSIS — R739 Hyperglycemia, unspecified: Secondary | ICD-10-CM | POA: Diagnosis not present

## 2021-08-31 DIAGNOSIS — M48061 Spinal stenosis, lumbar region without neurogenic claudication: Secondary | ICD-10-CM | POA: Diagnosis not present

## 2021-08-31 DIAGNOSIS — I10 Essential (primary) hypertension: Secondary | ICD-10-CM

## 2021-08-31 DIAGNOSIS — G894 Chronic pain syndrome: Secondary | ICD-10-CM | POA: Diagnosis not present

## 2021-08-31 DIAGNOSIS — E559 Vitamin D deficiency, unspecified: Secondary | ICD-10-CM

## 2021-08-31 MED ORDER — TRAMADOL HCL 50 MG PO TABS: 50.0000 mg | ORAL_TABLET | Freq: Four times a day (QID) | ORAL | 2 refills | Status: DC | PRN

## 2021-08-31 MED ORDER — METHYLPREDNISOLONE ACETATE 80 MG/ML IJ SUSP
80.0000 mg | Freq: Once | INTRAMUSCULAR | Status: AC
  Administered 2021-08-31: 80 mg via INTRAMUSCULAR

## 2021-08-31 MED ORDER — CYCLOBENZAPRINE HCL 5 MG PO TABS: 5.0000 mg | ORAL_TABLET | Freq: Three times a day (TID) | ORAL | 2 refills | Status: DC | PRN

## 2021-08-31 MED ORDER — PREDNISONE 10 MG PO TABS: ORAL_TABLET | ORAL | 0 refills | Status: DC

## 2021-08-31 NOTE — Patient Instructions (Signed)
Please take all new medication as prescribed  - the flexeril, and prednisone  You had the steroid shot today  Please continue all other medications as before, and refills have been done if requested - tramadol  Please have the pharmacy call with any other refills you may need.  Please continue your efforts at being more active, low cholesterol diet, and weight control.  Please keep your appointments with your specialists as you may have planned  You will be contacted regarding the referral for: Pain clinic

## 2021-08-31 NOTE — Progress Notes (Unsigned)
Patient ID: Pamela [name], female   DOB: 09-29-1955, 66 y.o.   MRN: [id number]        Chief Complaint: follow up HTN, HLD and hyperglycemia ***       HPI:  Pamela [name] is a 66 y.o. female here with c/o        Wt Readings from Last 3 Encounters:  08/31/21 238 lb (108 kg)  08/10/21 240 lb (108.9 kg)  06/28/21 237 lb 6.4 oz (107.7 kg)   BP Readings from Last 3 Encounters:  08/31/21 136/68  08/10/21 124/78  06/28/21 126/84         Past Medical History:  Diagnosis Date   ALLERGIC RHINITIS 12/18/2006   Qualifier: Diagnosis of  By: Danny Lawless CMA, [place]     Anxiety    Arthritis    DEPRESSION, SITUATIONAL 03/15/2007   Qualifier: Diagnosis of  By: Wynona Luna    GERD (gastroesophageal reflux disease)    Headache    occasional   History of kidney stones    HYPERLIPIDEMIA 12/18/2006   Qualifier: History of  By: [place], [place]     Hypertension    HYPERTENSION 12/18/2006   Qualifier: Diagnosis of  By: [place], [place]     INSOMNIA-SLEEP [place] 08/25/2009   Qualifier: Diagnosis of  By: Jenny Reichmann MD, Hunt Oris    Pain in knee region after total knee replacement ([place]) 04/03/2012   Pneumonia    RENAL CALCULUS, HX OF 12/18/2006   Qualifier: Diagnosis of  By: [place], [place]     Past Surgical History:  Procedure Laterality Date   CERVICAL DISC SURGERY     x 3   CONVERSION TO TOTAL KNEE Left 10/09/2016   Procedure: Conversion left uni compartment arthroplasty to total knee arthroplasty;  Surgeon: Paralee Cancel, MD;  Location: [facility];  Service: Orthopedics;  Laterality: Left;  90 mins   KNEE ARTHROSCOPY     partial   total left knee arthoplasty     10/09/16 Dr. Alvan Dame    reports that she quit smoking about 53 years ago. Her smoking use included cigarettes. She has a 0.75 pack-year smoking history. She has never been exposed to tobacco smoke. She has never used smokeless tobacco. She reports that she does not currently use alcohol. She reports that she does not use  drugs. family history includes Healthy in her daughter, daughter, and daughter; Heart disease in her father; Lung cancer in her brother and sister; Stroke in her mother. Allergies  Allergen Reactions   Cymbalta [Duloxetine Hcl] Other (See Comments)    Feeling weird   Current Outpatient Medications on File Prior to Visit  Medication Sig Dispense Refill   Adalimumab (HUMIRA PEN) 40 MG/0.4ML PNKT Inject 40 mg into the skin every 14 (fourteen) days. 6 each 0   ALPRAZolam (XANAX) 0.25 MG tablet TAKE 1 TABLET BY MOUTH TWICE A DAY AS NEEDED 60 tablet 2   atorvastatin (LIPITOR) 20 MG tablet Take 1 tablet (20 mg total) by mouth daily. 90 tablet 3   citalopram (CELEXA) 40 MG tablet Take 1 tablet (40 mg total) by mouth daily. 90 tablet 3   gabapentin (NEURONTIN) 300 MG capsule Take 1 capsule (300 mg total) by mouth 2 (two) times daily. 180 capsule 1   hydrochlorothiazide (MICROZIDE) 12.5 MG capsule TAKE 1 CAPSULE(12.5 MG) BY MOUTH DAILY 90 capsule 3   losartan (COZAAR) 100 MG tablet TAKE 1 TABLET BY MOUTH EVERY DAY 90 tablet 3   methotrexate (RHEUMATREX) 2.5  MG tablet TAKE 5 TABLETS (12.5 MG TOTAL) BY MOUTH ONCE A WEEK. CAUTION:CHEMOTHERAPY. PROTECT FROM LIGHT. 60 tablet 0   metoCLOPramide (REGLAN) 10 MG tablet Take 1 tablet (10 mg total) by mouth 2 (two) times daily. 180 tablet 3   Multiple Vitamin (MULTIVITAMIN WITH MINERALS) TABS tablet Take 1 tablet by mouth daily.     pantoprazole (PROTONIX) 40 MG tablet Take 1 tablet (40 mg total) by mouth 2 (two) times daily. 180 tablet 3   sertraline (ZOLOFT) 100 MG tablet TAKE 1 TABLET BY MOUTH EVERY DAY 90 tablet 3   traMADol (ULTRAM) 50 MG tablet TAKE 1 TABLET BY MOUTH EVERY 6 HOURS AS NEEDED FOR PAIN 120 tablet 2   traZODone (DESYREL) 50 MG tablet TAKE 1/2 TO 1 TABLET BY MOUTH AT BEDTIME AS NEEDED FOR SLEEP 90 tablet 1   XALATAN 0.005 % ophthalmic solution Place 1 drop into the right eye at bedtime.      zolpidem (AMBIEN) 10 MG tablet TAKE 1 TABLET BY MOUTH  AT BEDTIME AS NEEDED 90 tablet 1   No current facility-administered medications on file prior to visit.        ROS:  All others reviewed and negative.  Objective        PE:  BP 136/68 (BP Location: Right Arm, Patient Position: Sitting, Cuff Size: Large)   Pulse 87   Ht '5\' 9"'$  (1.753 m)   Wt 238 lb (108 kg)   SpO2 97%   BMI 35.15 kg/m                 Constitutional: Pt appears in NAD               HENT: Head: NCAT.                Right Ear: External ear normal.                 Left Ear: External ear normal.                Eyes: . Pupils are equal, round, and reactive to light. Conjunctivae and EOM are normal               Nose: without d/c or deformity               Neck: Neck supple. Gross normal ROM               Cardiovascular: Normal rate and regular rhythm.                 Pulmonary/Chest: Effort normal and breath sounds without rales or wheezing.                Abd:  Soft, NT, ND, + BS, no organomegaly               Neurological: Pt is alert. At baseline orientation, motor grossly intact               Skin: Skin is warm. No rashes, no other new lesions, LE edema - ***               Psychiatric: Pt behavior is normal without agitation   Micro: none  Cardiac tracings I have personally interpreted today:  none  Pertinent Radiological findings (summarize): none   Lab Results  Component Value Date   WBC 6.8 06/07/2021   HGB 12.2 06/07/2021   HCT 36.1 06/07/2021   PLT 230 06/07/2021   GLUCOSE 99  06/07/2021   CHOL 220 (H) 02/15/2021   TRIG 79.0 02/15/2021   HDL 87.80 02/15/2021   LDLDIRECT 109.9 04/03/2012   LDLCALC 117 (H) 02/15/2021   ALT 18 06/07/2021   AST 18 06/07/2021   NA 142 06/07/2021   K 3.7 06/07/2021   CL 104 06/07/2021   CREATININE 0.99 06/07/2021   BUN 12 06/07/2021   CO2 28 06/07/2021   TSH 1.01 02/15/2021   INR 1.0 06/29/2008   HGBA1C 5.6 02/15/2021   Assessment/Plan:  Pamela [name] is a 66 y.o. Black or African American [2] female with  has  a past medical history of ALLERGIC RHINITIS (12/18/2006), Anxiety, Arthritis, DEPRESSION, SITUATIONAL (03/15/2007), GERD (gastroesophageal reflux disease), Headache, History of kidney stones, HYPERLIPIDEMIA (12/18/2006), Hypertension, HYPERTENSION (12/18/2006), INSOMNIA-SLEEP DISORDER-UNSPEC (08/25/2009), Pain in knee region after total knee replacement ([place]) (04/03/2012), Pneumonia, and RENAL CALCULUS, HX OF (12/18/2006).  No problem-specific Assessment & Plan notes found for this encounter.  Followup: No follow-ups on file.  Cathlean Cower, MD 08/31/2021 11:02 AM [facility]

## 2021-09-04 ENCOUNTER — Encounter: Payer: Self-pay | Admitting: Internal Medicine

## 2021-09-04 NOTE — Assessment & Plan Note (Signed)
Lab Results  Component Value Date   HGBA1C 5.6 02/15/2021   Stable, pt to continue current medical treatment  - diet, wt control, excercise

## 2021-09-19 ENCOUNTER — Other Ambulatory Visit: Payer: Self-pay | Admitting: Physician Assistant

## 2021-09-29 ENCOUNTER — Ambulatory Visit: Payer: Medicare HMO | Admitting: Internal Medicine

## 2021-10-02 ENCOUNTER — Other Ambulatory Visit: Payer: Self-pay | Admitting: Internal Medicine

## 2021-10-21 ENCOUNTER — Other Ambulatory Visit: Payer: Self-pay | Admitting: Physician Assistant

## 2021-10-21 ENCOUNTER — Other Ambulatory Visit: Payer: Self-pay | Admitting: Internal Medicine

## 2021-10-27 NOTE — Progress Notes (Signed)
Office Visit Note  Patient: Pamela [name]             Date of Birth: Aug 23, 1955           MRN: [id number]             PCP: Biagio Borg, MD Referring: Biagio Borg, MD Visit Date: 11/10/2021 Occupation: '@GUAROCC'$ @  Subjective:  Medication management  History of Present Illness: Pamela [name] is a 66 y.o. female with history of rheumatoid arthritis and osteoarthritis.  She has been taking Humira and methotrexate on a regular basis.  She states she has been having pain and discomfort in her right knee joint.  Recently her left knee joint which is replaced is also been hurting.  She denies any joint swelling.  She has intermittent discomfort in the right trochanteric bursa.  She continues to have lower back pain.  She has difficulty walking due to knee joint discomfort.  She is requesting cortisone injection to right knee joint.  Last cortisone injection was in March 2023.  Activities of Daily Living:  Patient reports morning stiffness for all day. Patient Denies nocturnal pain.  Difficulty dressing/grooming: Denies Difficulty climbing stairs: Reports Difficulty getting out of chair: Reports Difficulty using hands for taps, buttons, cutlery, and/or writing: Denies  Review of Systems  Constitutional:  Positive for fatigue.  HENT:  Negative for mouth sores and mouth dryness.   Eyes:  Positive for dryness.  Respiratory:  Negative for shortness of breath.   Cardiovascular:  Negative for chest pain and palpitations.  Gastrointestinal:  Negative for blood in stool, constipation and diarrhea.  Endocrine: Negative for increased urination.  Genitourinary:  Negative for involuntary urination.  Musculoskeletal:  Positive for joint pain, joint pain, myalgias, morning stiffness, muscle tenderness and myalgias. Negative for gait problem, joint swelling and muscle weakness.  Skin:  Negative for color change, rash, hair loss and sensitivity to sunlight.  Allergic/Immunologic: Negative for  susceptible to infections.  Neurological:  Negative for dizziness and headaches.  Hematological:  Negative for swollen glands.  Psychiatric/Behavioral:  Negative for depressed mood and sleep disturbance. The patient is not nervous/anxious.     PMFS History:  Patient Active Problem List   Diagnosis Date Noted   Vitamin D deficiency 04/03/2021   Cough 03/29/2021   Acute upper respiratory infection 02/15/2021   Snoring 02/15/2021   Spinal stenosis of lumbar region 08/05/2019   Inability to walk 08/05/2019   Hives 07/08/2019   History of glaucoma 04/24/2019   Bursitis of left hip 12/25/2018   MGUS (monoclonal gammopathy of unknown significance) 05/11/2018   Rheumatoid arthritis involving multiple sites with positive rheumatoid factor ([place]) 05/03/2018   High risk medication use 05/03/2018   Status post total left knee replacement 05/03/2018   DDD (degenerative disc disease), lumbar 05/03/2018   Primary osteoarthritis of right knee 04/05/2018   Arthritis of left wrist 12/19/2017   Left rotator cuff tear 12/19/2017   Left wrist pain 12/12/2017   Left shoulder pain 08/22/2017   Acute pain of right knee 04/17/2017   Pain of left calf 04/13/2017   S/P left TK revision 10/09/2016   Hyperglycemia 09/23/2015   Anxiety state 09/23/2015   Abnormal urine odor 08/25/2015   GERD without esophagitis 08/25/2015   Upper abdominal pain 04/16/2015   Early satiety 04/16/2015   Dysuria 04/16/2015   Encounter for well adult exam with abnormal findings 06/18/2014   Greater trochanteric bursitis of right hip 01/06/2013   Left lumbar radiculitis 04/03/2012  Eczema 04/03/2012   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   BURSITIS, RIGHT HIP 08/25/2009   INSOMNIA-SLEEP DISORDER-UNSPEC 08/25/2009   FATIGUE 08/25/2009   Cervicalgia 08/14/2007   Depression 03/15/2007   HLD (hyperlipidemia) 12/18/2006   Essential hypertension 12/18/2006   Allergic rhinitis 12/18/2006   RENAL CALCULUS, HX OF 12/18/2006    Past  Medical History:  Diagnosis Date   ALLERGIC RHINITIS 12/18/2006   Qualifier: Diagnosis of  By: Danny Lawless CMA, [place]     Anxiety    Arthritis    DEPRESSION, SITUATIONAL 03/15/2007   Qualifier: Diagnosis of  By: Wynona Luna    GERD (gastroesophageal reflux disease)    Headache    occasional   History of kidney stones    HYPERLIPIDEMIA 12/18/2006   Qualifier: History of  By: [place], [place]     Hypertension    HYPERTENSION 12/18/2006   Qualifier: Diagnosis of  By: [place], [place]     INSOMNIA-SLEEP [place] 08/25/2009   Qualifier: Diagnosis of  By: Jenny Reichmann MD, Hunt Oris    Pain in knee region after total knee replacement ([place]) 04/03/2012   Pneumonia    RENAL CALCULUS, HX OF 12/18/2006   Qualifier: Diagnosis of  By: Danny Lawless CMA, [place]      Family History  Problem Relation Age of Onset   Stroke Mother    Heart disease Father    Lung cancer Sister    Lung cancer Brother    Healthy Daughter    Healthy Daughter    Healthy Daughter    Colon cancer Neg Hx    Esophageal cancer Neg Hx    Pancreatic cancer Neg Hx    Stomach cancer Neg Hx    Past Surgical History:  Procedure Laterality Date   CERVICAL DISC SURGERY     x 3   CONVERSION TO TOTAL KNEE Left 10/09/2016   Procedure: Conversion left uni compartment arthroplasty to total knee arthroplasty;  Surgeon: Paralee Cancel, MD;  Location: [facility];  Service: Orthopedics;  Laterality: Left;  90 mins   KNEE ARTHROSCOPY     partial   total left knee arthoplasty     10/09/16 Dr. Alvan Dame   Social History   Social History Narrative   Not on file   Immunization History  Administered Date(s) Administered   Fluad Quad(high Dose 65+) 04/16/2020, 12/24/2020   Influenza Whole 11/12/2012   Influenza,inj,Quad PF,6+ Mos 11/29/2016, 12/11/2017, 12/25/2018   Influenza-Unspecified 12/15/2013, 12/12/2015, 11/29/2016   Td 11/03/2002   Tdap 09/23/2015   Zoster Recombinat (Shingrix) 03/22/2018, 08/20/2018   Zoster, Live 08/12/2012      Objective: Vital Signs: BP 119/81 (BP Location: Left Arm, Patient Position: Sitting, Cuff Size: Large)   Pulse 94   Resp 17   Ht '5\' 8"'$  (1.727 m)   Wt 240 lb 12.8 oz (109.2 kg)   BMI 36.61 kg/m    Physical Exam Vitals and nursing note reviewed.  Constitutional:      Appearance: She is well-developed.  HENT:     Head: Normocephalic and atraumatic.  Eyes:     Conjunctiva/sclera: Conjunctivae normal.  Cardiovascular:     Rate and Rhythm: Normal rate and regular rhythm.     Heart sounds: Normal heart sounds.  Pulmonary:     Effort: Pulmonary effort is normal.     Breath sounds: Normal breath sounds.  Abdominal:     General: Bowel sounds are normal.     Palpations: Abdomen is soft.  Musculoskeletal:     Cervical back: Normal range  of motion.  Lymphadenopathy:     Cervical: No cervical adenopathy.  Skin:    General: Skin is warm and dry.     Capillary Refill: Capillary refill takes less than 2 seconds.  Neurological:     Mental Status: She is alert and oriented to person, place, and time.  Psychiatric:        Behavior: Behavior normal.      Musculoskeletal Exam: Cervical spine was in good range of motion.  Shoulder joints, elbow joints, wrist joints, MCPs PIPs and DIPs with good range of motion with no synovitis.  Hip joints were in good range of motion.  She had discomfort range of motion of her right knee joint without any warmth swelling or effusion.  Left knee joint is replaced.  She had tenderness over right trochanteric bursa.  She had no tenderness over ankles or MTPs.  CDAI Exam: CDAI Score: 2.4  Patient Global: 2 mm; Provider Global: 2 mm Swollen: 0 ; Tender: 2  Joint Exam 11/10/2021      Right  Left  Knee   Tender   Tender     Investigation: No additional findings.  Imaging: No results found.  Recent Labs: Lab Results  Component Value Date   WBC 6.8 06/07/2021   HGB 12.2 06/07/2021   PLT 230 06/07/2021   NA 142 06/07/2021   K 3.7 06/07/2021    CL 104 06/07/2021   CO2 28 06/07/2021   GLUCOSE 99 06/07/2021   BUN 12 06/07/2021   CREATININE 0.99 06/07/2021   BILITOT 0.7 06/07/2021   ALKPHOS 52 02/15/2021   AST 18 06/07/2021   ALT 18 06/07/2021   PROT 7.3 06/07/2021   ALBUMIN 4.4 02/15/2021   CALCIUM 9.9 06/07/2021   GFRAA 74 05/14/2020   QFTBGOLDPLUS NEGATIVE 10/15/2020    Speciality Comments: PLQ Eye Exam: 03/26/2019 WNL @ [facility] M [facility] OD PA FOllow up in 1 year  Procedures:  Large Joint Inj: R knee on 11/10/2021 10:02 AM Indications: pain Details: 27 G 1.5 in needle, medial approach  Arthrogram: No  Medications: 40 mg triamcinolone acetonide 40 MG/ML; 1.5 mL lidocaine 1 % Aspirate: 0 mL Outcome: tolerated well, no immediate complications Procedure, treatment alternatives, risks and benefits explained, specific risks discussed. Consent was given by the patient. Immediately prior to procedure a time out was called to verify the correct patient, procedure, equipment, support staff and site/side marked as required. Patient was prepped and draped in the usual sterile fashion.     Allergies: Cymbalta [duloxetine hcl]   Assessment / Plan:     Visit Diagnoses: Rheumatoid arthritis involving multiple sites with positive rheumatoid factor (HCC) - ANA 1:160 Cytoplasmic, RF 116, sed rate 22: She has been experiencing increased discomfort in his knee joints.  None of the joints are swollen.  She has been taking Humira and methotrexate on a regular basis.  High risk medication use - Humira 40 mg sq injections every 14 days-started 12/01/19, Methotrexate 5 tablets by mouth once weekly, and folic acid 1 mg po daily.  Labs obtained on March 2023 were reviewed.  TB gold was negative on October 15, 2020.  Will obtain CBC with differential, CMP with GFR and TB Gold today.  She has been advised to get CBC with differential and CMP with GFR every 3 months to monitor for drug toxicity.  She is advised to hold Humira and methotrexate if she  develops an infection and resume after the infection resolves.  Annual skin examination to screen  for skin cancer was advised while she is on Humira.  Information regarding immunization was placed in the AVS.  Primary osteoarthritis of right knee-she has been having pain and discomfort in her right knee joint.  X-rays obtained in March 2023 were consistent with severe osteoarthritis.  She has been having increased pain and requested repeat injection.  After informed consent was obtained right knee joint was injected with lidocaine and cortisone as described above.  She tolerated the procedure well.  Postprocedure instructions were given.  Chronic pain of right knee-she continues to have pain and discomfort.  S/P left TK revision-she has been experiencing increased discomfort in her left knee as she is favoring her right knee.  Trochanteric bursitis, right hip-I did do stretches were discussed.  Traumatic incomplete tear of left rotator cuff, subsequent encounter-she has intermittent discomfort in her left shoulder.  DDD (degenerative disc disease), lumbar -she continues to have chronic discomfort in her lower back.  Mild multilevel degenerative disc disease with disc space narrowing at L3-L4, facet hypertrophy in the lower lumbar spine, and levoconvex rotatory scoliosis.  Essential hypertension-blood pressure was normal today.  History of hyperlipidemia-increased risk of heart disease with rheumatoid arthritis was discussed.  Dietary modifications were advised.  Other medical problems are listed as follows:  History of gastroesophageal reflux (GERD)  History of glaucoma  Other proteinuria  Anxiety and depression  Vitamin D deficiency  Orders: Orders Placed This Encounter  Procedures   Large Joint Inj   CBC with Differential/Platelet   COMPLETE METABOLIC PANEL WITH GFR   QuantiFERON-TB Gold Plus   No orders of the defined types were placed in this encounter.   Follow-Up  Instructions: Return in about 5 months (around 04/12/2022) for Rheumatoid arthritis, Osteoarthritis.   Bo Merino, MD  Note - This record has been created using Editor, commissioning.  Chart creation errors have been sought, but may not always  have been located. Such creation errors do not reflect on  the standard of medical care.

## 2021-10-30 ENCOUNTER — Other Ambulatory Visit: Payer: Self-pay | Admitting: Internal Medicine

## 2021-11-07 ENCOUNTER — Other Ambulatory Visit: Payer: Self-pay | Admitting: Physician Assistant

## 2021-11-08 NOTE — Telephone Encounter (Signed)
Next Visit: 11/10/2021  Last Visit: 06/07/2021  Last Fill: 08/11/2021  DX: Rheumatoid arthritis involving multiple sites with positive rheumatoid factor   Current Dose per office note 06/07/2021: Methotrexate 5 tablets by mouth once weekly,  Labs: 06/07/2021 CBC and CMP WNL I called patient to advise labs are due.  Okay to refill MTX?

## 2021-11-10 ENCOUNTER — Ambulatory Visit: Payer: Medicare HMO | Attending: Rheumatology | Admitting: Rheumatology

## 2021-11-10 ENCOUNTER — Encounter: Payer: Self-pay | Admitting: Rheumatology

## 2021-11-10 ENCOUNTER — Other Ambulatory Visit: Payer: Self-pay | Admitting: Rheumatology

## 2021-11-10 VITALS — BP 119/81 | HR 94 | Resp 17 | Ht 68.0 in | Wt 240.8 lb

## 2021-11-10 DIAGNOSIS — M5136 Other intervertebral disc degeneration, lumbar region: Secondary | ICD-10-CM

## 2021-11-10 DIAGNOSIS — M0579 Rheumatoid arthritis with rheumatoid factor of multiple sites without organ or systems involvement: Secondary | ICD-10-CM | POA: Diagnosis not present

## 2021-11-10 DIAGNOSIS — Z8639 Personal history of other endocrine, nutritional and metabolic disease: Secondary | ICD-10-CM

## 2021-11-10 DIAGNOSIS — M1711 Unilateral primary osteoarthritis, right knee: Secondary | ICD-10-CM

## 2021-11-10 DIAGNOSIS — Z79899 Other long term (current) drug therapy: Secondary | ICD-10-CM | POA: Diagnosis not present

## 2021-11-10 DIAGNOSIS — Z8719 Personal history of other diseases of the digestive system: Secondary | ICD-10-CM

## 2021-11-10 DIAGNOSIS — G8929 Other chronic pain: Secondary | ICD-10-CM

## 2021-11-10 DIAGNOSIS — S46012D Strain of muscle(s) and tendon(s) of the rotator cuff of left shoulder, subsequent encounter: Secondary | ICD-10-CM | POA: Diagnosis not present

## 2021-11-10 DIAGNOSIS — R808 Other proteinuria: Secondary | ICD-10-CM | POA: Diagnosis not present

## 2021-11-10 DIAGNOSIS — E559 Vitamin D deficiency, unspecified: Secondary | ICD-10-CM

## 2021-11-10 DIAGNOSIS — Z96652 Presence of left artificial knee joint: Secondary | ICD-10-CM | POA: Diagnosis not present

## 2021-11-10 DIAGNOSIS — Z8669 Personal history of other diseases of the nervous system and sense organs: Secondary | ICD-10-CM | POA: Diagnosis not present

## 2021-11-10 DIAGNOSIS — F32A Depression, unspecified: Secondary | ICD-10-CM

## 2021-11-10 DIAGNOSIS — M7061 Trochanteric bursitis, right hip: Secondary | ICD-10-CM | POA: Diagnosis not present

## 2021-11-10 DIAGNOSIS — I10 Essential (primary) hypertension: Secondary | ICD-10-CM | POA: Diagnosis not present

## 2021-11-10 DIAGNOSIS — F419 Anxiety disorder, unspecified: Secondary | ICD-10-CM

## 2021-11-10 MED ORDER — TRIAMCINOLONE ACETONIDE 40 MG/ML IJ SUSP
40.0000 mg | INTRAMUSCULAR | Status: AC | PRN
  Administered 2021-11-10: 40 mg via INTRA_ARTICULAR

## 2021-11-10 MED ORDER — LIDOCAINE HCL 1 % IJ SOLN
1.5000 mL | INTRAMUSCULAR | Status: AC | PRN
  Administered 2021-11-10: 1.5 mL

## 2021-11-10 NOTE — Patient Instructions (Signed)
Standing Labs We placed an order today for your standing lab work.   Please have your standing labs drawn in December and every 3 months  If possible, please have your labs drawn 2 weeks prior to your appointment so that the provider can discuss your results at your appointment.  Please note that you may see your imaging and lab results in [place] before we have reviewed them. We may be awaiting multiple results to interpret others before contacting you. Please allow our office up to 72 hours to thoroughly review all of the results before contacting the office for clarification of your results.  We currently have open lab daily: Monday through Thursday from 1:30 PM-4:30 PM and Friday from 1:30 PM- 4:00 PM If possible, please come for your lab work on Monday, Thursday or Friday afternoons, as you may experience shorter wait times.   Effective January 11, 2022 the new lab hours will change to: Monday through Thursday from 1:30 PM-5:00 PM and Friday from 8:30 AM-12:00 PM If possible, please come for your lab work on Monday and Thursday afternoons, as you may experience shorter wait times.  Please be advised, all patients with office appointments requiring lab work will take precedent over walk-in lab work.    The office is located at [address] [place], [place], [place] [address] No appointment is necessary.   Labs are drawn by [facility]. Please bring your co-pay at the time of your lab draw.  You may receive a bill from [facility] for your lab work.  Please note if you are on Hydroxychloroquine and and an order has been placed for a Hydroxychloroquine level, you will need to have it drawn 4 hours or more after your last dose.  If you wish to have your labs drawn at another location, please call the office 24 hours in advance to send orders.  If you have any questions regarding directions or hours of operation,  please call [phone].   As a reminder, please drink plenty of water prior  to coming for your lab work. Thanks!   Vaccines You are taking a medication(s) that can suppress your immune system.  The following immunizations are recommended: Flu annually Covid-19  Td/Tdap (tetanus, diphtheria, pertussis) every 10 years Pneumonia (Prevnar 15 then Pneumovax 23 at least 1 year apart.  Alternatively, can take Prevnar 20 without needing additional dose) Shingrix: 2 doses from 4 weeks to 6 months apart  Please check with your PCP to make sure you are up to date.   If you have signs or symptoms of an infection or start antibiotics: First, call your PCP for workup of your infection. Hold your medication through the infection, until you complete your antibiotics, and until symptoms resolve if you take the following: Injectable medication (Actemra, Benlysta, Cimzia, Cosentyx, Enbrel, Humira, Kevzara, Orencia, Remicade, Simponi, Stelara, Taltz, Tremfya) Methotrexate Leflunomide (Arava) Mycophenolate (Cellcept) Morrie Sheldon, Olumiant, or Rinvoq  Please get annual skin examination to screen for skin cancer while you are on Humira

## 2021-11-15 ENCOUNTER — Other Ambulatory Visit: Payer: Self-pay | Admitting: *Deleted

## 2021-11-15 LAB — CBC WITH DIFFERENTIAL/PLATELET
Basophils Absolute: 0.1 10*3/uL (ref 0.0–0.2)
Basos: 2 %
EOS (ABSOLUTE): 0.3 10*3/uL (ref 0.0–0.4)
Eos: 5 %
Hematocrit: 33.4 % — ABNORMAL LOW (ref 34.0–46.6)
Hemoglobin: 11.2 g/dL (ref 11.1–15.9)
Immature Grans (Abs): 0 10*3/uL (ref 0.0–0.1)
Immature Granulocytes: 0 %
Lymphocytes Absolute: 1.3 10*3/uL (ref 0.7–3.1)
Lymphs: 19 %
MCH: 29.3 pg (ref 26.6–33.0)
MCHC: 33.5 g/dL (ref 31.5–35.7)
MCV: 87 fL (ref 79–97)
Monocytes Absolute: 0.9 10*3/uL (ref 0.1–0.9)
Monocytes: 14 %
Neutrophils Absolute: 4 10*3/uL (ref 1.4–7.0)
Neutrophils: 60 %
Platelets: 240 10*3/uL (ref 150–450)
RBC: 3.82 x10E6/uL (ref 3.77–5.28)
RDW: 12.8 % (ref 11.7–15.4)
WBC: 6.6 10*3/uL (ref 3.4–10.8)

## 2021-11-15 LAB — CMP14+EGFR
ALT: 21 IU/L (ref 0–32)
AST: 17 IU/L (ref 0–40)
Albumin/Globulin Ratio: 1.7 (ref 1.2–2.2)
Albumin: 4.5 g/dL (ref 3.9–4.9)
Alkaline Phosphatase: 71 IU/L (ref 44–121)
BUN/Creatinine Ratio: 15 (ref 12–28)
BUN: 16 mg/dL (ref 8–27)
Bilirubin Total: 0.6 mg/dL (ref 0.0–1.2)
CO2: 22 mmol/L (ref 20–29)
Calcium: 9.9 mg/dL (ref 8.7–10.3)
Chloride: 100 mmol/L (ref 96–106)
Creatinine, Ser: 1.07 mg/dL — ABNORMAL HIGH (ref 0.57–1.00)
Globulin, Total: 2.7 g/dL (ref 1.5–4.5)
Glucose: 98 mg/dL (ref 70–99)
Potassium: 3.7 mmol/L (ref 3.5–5.2)
Sodium: 138 mmol/L (ref 134–144)
Total Protein: 7.2 g/dL (ref 6.0–8.5)
eGFR: 57 mL/min/{1.73_m2} — ABNORMAL LOW (ref >59)

## 2021-11-15 LAB — QUANTIFERON-TB GOLD PLUS
QuantiFERON Mitogen Value: 7.02 IU/mL
QuantiFERON Nil Value: 0.08 IU/mL
QuantiFERON TB1 Ag Value: 0.09 IU/mL
QuantiFERON TB2 Ag Value: 0.1 IU/mL
QuantiFERON-TB Gold Plus: NEGATIVE

## 2021-11-15 MED ORDER — METHOTREXATE 2.5 MG PO TABS: 10.0000 mg | ORAL_TABLET | ORAL | 0 refills | Status: DC

## 2021-11-15 NOTE — Telephone Encounter (Signed)
-----   Message from Ofilia Neas, PA-C sent at 11/11/2021 12:32 PM EDT ----- TB gold pending. Creatinine is elevated-1.07 and GFR is low-57. Rest of CMP WNL. Please advise the patient to reduce methotrexate to 4 tablets once weekly. Avoid the use of NSAIDs. Recheck BMP with GFR in 1 month.  Hematocrit is borderline low. Rest of CBC WNL.

## 2021-11-21 ENCOUNTER — Other Ambulatory Visit: Payer: Self-pay

## 2021-11-21 ENCOUNTER — Telehealth: Payer: Self-pay | Admitting: Rheumatology

## 2021-11-21 DIAGNOSIS — M0579 Rheumatoid arthritis with rheumatoid factor of multiple sites without organ or systems involvement: Secondary | ICD-10-CM

## 2021-11-21 MED ORDER — HUMIRA (2 PEN) 40 MG/0.4ML ~~LOC~~ AJKT: 40.0000 mg | AUTO-INJECTOR | SUBCUTANEOUS | 0 refills | Status: DC

## 2021-11-21 NOTE — Telephone Encounter (Signed)
Patient left a voicemail requesting a return call regarding her labwork results.  Patient states she has additional questions.

## 2021-11-21 NOTE — Telephone Encounter (Signed)
Patient returned call to the office and states she need clarification on what she was to do with her MTX and why. Patient advised she is to reduce her MTX to 4 tabs once weekly due to her kidney function. Patient reminded to come to the office in 1 month for labs so we may check her kidney function. Patient expressed understanding.

## 2021-11-21 NOTE — Telephone Encounter (Signed)
Received refill request via fax from [facility] for Humira.   Next Visit: 04/12/2022  Last Visit: 11/10/2021  Last Fill: 08/24/2021  DT:HYHOOILNZV arthritis involving multiple sites with positive rheumatoid factor  Current Dose per office note 11/10/2021: Humira 40 mg sq injections every 14 days  Labs: 11/10/2021 Creatinine is elevated-1.07 and GFR is low-57. Rest of CMP WNL. Please advise the patient to reduce methotrexate to 4 tablets once weekly. Avoid the use of NSAIDs. Recheck BMP with GFR in 1 month.  Hematocrit is borderline low. Rest of CBC WNL.   TB Gold: 11/10/2021 Negative   Okay to refill Humira?

## 2021-11-21 NOTE — Telephone Encounter (Signed)
Attempted to contact the patient and left message for patient to call the office.  

## 2021-12-16 ENCOUNTER — Other Ambulatory Visit: Payer: Self-pay

## 2021-12-16 MED ORDER — ALPRAZOLAM 0.25 MG PO TABS: ORAL_TABLET | ORAL | 2 refills | Status: DC

## 2021-12-16 NOTE — Progress Notes (Unsigned)
LOV: 08/31/21

## 2021-12-19 ENCOUNTER — Other Ambulatory Visit: Payer: Self-pay | Admitting: Internal Medicine

## 2021-12-19 ENCOUNTER — Encounter: Payer: Self-pay | Admitting: Internal Medicine

## 2021-12-19 ENCOUNTER — Ambulatory Visit (INDEPENDENT_AMBULATORY_CARE_PROVIDER_SITE_OTHER): Payer: Medicare HMO | Admitting: Internal Medicine

## 2021-12-19 VITALS — BP 128/76 | HR 92 | Temp 98.3°F | Ht 68.0 in | Wt 246.0 lb

## 2021-12-19 DIAGNOSIS — M5442 Lumbago with sciatica, left side: Secondary | ICD-10-CM

## 2021-12-19 DIAGNOSIS — E538 Deficiency of other specified B group vitamins: Secondary | ICD-10-CM

## 2021-12-19 DIAGNOSIS — G8929 Other chronic pain: Secondary | ICD-10-CM | POA: Insufficient documentation

## 2021-12-19 DIAGNOSIS — Z23 Encounter for immunization: Secondary | ICD-10-CM

## 2021-12-19 DIAGNOSIS — E559 Vitamin D deficiency, unspecified: Secondary | ICD-10-CM | POA: Diagnosis not present

## 2021-12-19 DIAGNOSIS — M48061 Spinal stenosis, lumbar region without neurogenic claudication: Secondary | ICD-10-CM

## 2021-12-19 DIAGNOSIS — R739 Hyperglycemia, unspecified: Secondary | ICD-10-CM

## 2021-12-19 DIAGNOSIS — G894 Chronic pain syndrome: Secondary | ICD-10-CM | POA: Diagnosis not present

## 2021-12-19 DIAGNOSIS — E782 Mixed hyperlipidemia: Secondary | ICD-10-CM | POA: Diagnosis not present

## 2021-12-19 DIAGNOSIS — D649 Anemia, unspecified: Secondary | ICD-10-CM

## 2021-12-19 LAB — URINALYSIS, ROUTINE W REFLEX MICROSCOPIC
Bilirubin Urine: NEGATIVE
Hgb urine dipstick: NEGATIVE
Ketones, ur: NEGATIVE
Leukocytes,Ua: NEGATIVE
Nitrite: NEGATIVE
RBC / HPF: NONE SEEN (ref 0–?)
Specific Gravity, Urine: 1.01 (ref 1.000–1.030)
Total Protein, Urine: NEGATIVE
Urine Glucose: NEGATIVE
Urobilinogen, UA: 0.2 (ref 0.0–1.0)
pH: 6 (ref 5.0–8.0)

## 2021-12-19 LAB — CBC WITH DIFFERENTIAL/PLATELET
Basophils Absolute: 0.1 10*3/uL (ref 0.0–0.1)
Basophils Relative: 1.2 % (ref 0.0–3.0)
Eosinophils Absolute: 0.2 10*3/uL (ref 0.0–0.7)
Eosinophils Relative: 3.5 % (ref 0.0–5.0)
HCT: 32.9 % — ABNORMAL LOW (ref 36.0–46.0)
Hemoglobin: 10.9 g/dL — ABNORMAL LOW (ref 12.0–15.0)
Lymphocytes Relative: 23.3 % (ref 12.0–46.0)
Lymphs Abs: 1.5 10*3/uL (ref 0.7–4.0)
MCHC: 33.3 g/dL (ref 30.0–36.0)
MCV: 87.5 fl (ref 78.0–100.0)
Monocytes Absolute: 0.2 10*3/uL (ref 0.1–1.0)
Monocytes Relative: 3.5 % (ref 3.0–12.0)
Neutro Abs: 4.5 10*3/uL (ref 1.4–7.7)
Neutrophils Relative %: 68.5 % (ref 43.0–77.0)
Platelets: 202 10*3/uL (ref 150.0–400.0)
RBC: 3.76 Mil/uL — ABNORMAL LOW (ref 3.87–5.11)
RDW: 12.5 % (ref 11.5–15.5)
WBC: 6.5 10*3/uL (ref 4.0–10.5)

## 2021-12-19 LAB — LIPID PANEL
Cholesterol: 172 mg/dL (ref 0–200)
HDL: 80.6 mg/dL (ref >39.00)
LDL Cholesterol: 78 mg/dL (ref 0–99)
NonHDL: 91.35
Total CHOL/HDL Ratio: 2
Triglycerides: 68 mg/dL (ref 0.0–149.0)
VLDL: 13.6 mg/dL (ref 0.0–40.0)

## 2021-12-19 LAB — HEPATIC FUNCTION PANEL
ALT: 27 U/L (ref 0–35)
AST: 21 U/L (ref 0–37)
Albumin: 3.8 g/dL (ref 3.5–5.2)
Alkaline Phosphatase: 64 U/L (ref 39–117)
Bilirubin, Direct: 0.1 mg/dL (ref 0.0–0.3)
Total Bilirubin: 0.4 mg/dL (ref 0.2–1.2)
Total Protein: 7 g/dL (ref 6.0–8.3)

## 2021-12-19 LAB — BASIC METABOLIC PANEL
BUN: 23 mg/dL (ref 6–23)
CO2: 28 mEq/L (ref 19–32)
Calcium: 9.6 mg/dL (ref 8.4–10.5)
Chloride: 103 mEq/L (ref 96–112)
Creatinine, Ser: 1.13 mg/dL (ref 0.40–1.20)
GFR: 50.4 mL/min — ABNORMAL LOW (ref >60.00)
Glucose, Bld: 88 mg/dL (ref 70–99)
Potassium: 3.5 mEq/L (ref 3.5–5.1)
Sodium: 139 mEq/L (ref 135–145)

## 2021-12-19 LAB — HEMOGLOBIN A1C: Hgb A1c MFr Bld: 5.7 % (ref 4.6–6.5)

## 2021-12-19 LAB — TSH: TSH: 2.12 u[IU]/mL (ref 0.35–5.50)

## 2021-12-19 LAB — VITAMIN B12: Vitamin B-12: 453 pg/mL (ref 211–911)

## 2021-12-19 LAB — VITAMIN D 25 HYDROXY (VIT D DEFICIENCY, FRACTURES): VITD: 30.36 ng/mL (ref 30.00–100.00)

## 2021-12-19 MED ORDER — HYDROCODONE-ACETAMINOPHEN 10-325 MG PO TABS: 1.0000 | ORAL_TABLET | Freq: Three times a day (TID) | ORAL | 0 refills | Status: AC | PRN

## 2021-12-19 MED ORDER — GABAPENTIN 100 MG PO CAPS: 400.0000 mg | ORAL_CAPSULE | Freq: Three times a day (TID) | ORAL | 5 refills | Status: DC

## 2021-12-19 NOTE — Assessment & Plan Note (Signed)
Also for new pain clinic referral, and limited 1 mo only rx for hydrocodone 10//325 mg qid prn

## 2021-12-19 NOTE — Assessment & Plan Note (Signed)
Ok for increased gabapentin to 400 mg tid,  to f/u any worsening symptoms or concerns

## 2021-12-19 NOTE — Assessment & Plan Note (Signed)
Lab Results  Component Value Date   LDLCALC 117 (H) 02/15/2021   Uncontrolled, pt to continue current statin lipitor 20 mg and check lipids

## 2021-12-19 NOTE — Addendum Note (Signed)
Addended by: Max Sane on: 12/19/2021 10:55 AM   Modules accepted: Orders

## 2021-12-19 NOTE — Progress Notes (Signed)
Patient ID: ADMIRE BUNNELL, female   DOB: 08/03/1955, 66 y.o.   MRN: [id number]        Chief Complaint: follow up chronic pain/ lbp,        HPI:  Pamela [name] is a 66 y.o. female here with c/o persistent pain about 10/10 in the past wk, still trying to refuse surgury in order to take care of her husband, has a bill at the pain clinic so cant be seen there.  Pt denies chest pain, increased sob or doe, wheezing, orthopnea, PND, increased LE swelling, palpitations, dizziness or syncope.   Pt denies polydipsia, polyuria, or new focal neuro s/s.   Due for flu shot       Wt Readings from Last 3 Encounters:  12/19/21 246 lb (111.6 kg)  11/10/21 240 lb 12.8 oz (109.2 kg)  08/31/21 238 lb (108 kg)   BP Readings from Last 3 Encounters:  12/19/21 128/76  11/10/21 119/81  08/31/21 136/68         Past Medical History:  Diagnosis Date   ALLERGIC RHINITIS 12/18/2006   Qualifier: Diagnosis of  By: Danny Lawless CMA, [place]     Anxiety    Arthritis    DEPRESSION, SITUATIONAL 03/15/2007   Qualifier: Diagnosis of  By: Wynona Luna    GERD (gastroesophageal reflux disease)    Headache    occasional   History of kidney stones    HYPERLIPIDEMIA 12/18/2006   Qualifier: History of  By: [place], [place]     Hypertension    HYPERTENSION 12/18/2006   Qualifier: Diagnosis of  By: [place], [place]     INSOMNIA-SLEEP [place] 08/25/2009   Qualifier: Diagnosis of  By: Jenny Reichmann MD, Hunt Oris    Pain in knee region after total knee replacement ([place]) 04/03/2012   Pneumonia    RENAL CALCULUS, HX OF 12/18/2006   Qualifier: Diagnosis of  By: [place], [place]     Past Surgical History:  Procedure Laterality Date   CERVICAL DISC SURGERY     x 3   CONVERSION TO TOTAL KNEE Left 10/09/2016   Procedure: Conversion left uni compartment arthroplasty to total knee arthroplasty;  Surgeon: Paralee Cancel, MD;  Location: [facility];  Service: Orthopedics;  Laterality: Left;  90 mins   KNEE ARTHROSCOPY     partial    total left knee arthoplasty     10/09/16 Dr. Alvan Dame    reports that she quit smoking about 53 years ago. Her smoking use included cigarettes. She has a 0.75 pack-year smoking history. She has never been exposed to tobacco smoke. She has never used smokeless tobacco. She reports current alcohol use. She reports that she does not use drugs. family history includes Healthy in her daughter, daughter, and daughter; Heart disease in her father; Lung cancer in her brother and sister; Stroke in her mother. Allergies  Allergen Reactions   Cymbalta [Duloxetine Hcl] Other (See Comments)    Feeling weird   Current Outpatient Medications on File Prior to Visit  Medication Sig Dispense Refill   Adalimumab (HUMIRA PEN) 40 MG/0.4ML PNKT Inject 40 mg into the skin every 14 (fourteen) days. 6 each 0   ALPRAZolam (XANAX) 0.25 MG tablet TAKE 1 TABLET BY MOUTH TWICE A DAY AS NEEDED 60 tablet 2   atorvastatin (LIPITOR) 20 MG tablet Take 1 tablet (20 mg total) by mouth daily. 90 tablet 3   citalopram (CELEXA) 40 MG tablet TAKE 1 TABLET BY MOUTH EVERY DAY 90 tablet 3  cyclobenzaprine (FLEXERIL) 5 MG tablet Take 1 tablet (5 mg total) by mouth 3 (three) times daily as needed for muscle spasms. 90 tablet 2   hydrochlorothiazide (MICROZIDE) 12.5 MG capsule TAKE 1 CAPSULE(12.5 MG) BY MOUTH DAILY 90 capsule 3   losartan (COZAAR) 100 MG tablet TAKE 1 TABLET BY MOUTH EVERY DAY 90 tablet 3   methotrexate (RHEUMATREX) 2.5 MG tablet Take 4 tablets (10 mg total) by mouth once a week. Caution:Chemotherapy. Protect from light. 48 tablet 0   metoCLOPramide (REGLAN) 10 MG tablet TAKE 1 TABLET BY MOUTH IN THE MORNING AND IN THE EVENING 180 tablet 3   Multiple Vitamin (MULTIVITAMIN WITH MINERALS) TABS tablet Take 1 tablet by mouth daily.     pantoprazole (PROTONIX) 40 MG tablet Take 1 tablet (40 mg total) by mouth 2 (two) times daily. 180 tablet 3   sertraline (ZOLOFT) 100 MG tablet TAKE 1 TABLET BY MOUTH EVERY DAY 90 tablet 3    traMADol (ULTRAM) 50 MG tablet Take 1 tablet (50 mg total) by mouth every 6 (six) hours as needed. for pain 120 tablet 2   traZODone (DESYREL) 50 MG tablet TAKE 1/2 TO 1 TABLET BY MOUTH AT BEDTIME AS NEEDED FOR SLEEP 90 tablet 1   XALATAN 0.005 % ophthalmic solution Place 1 drop into the right eye at bedtime.      zolpidem (AMBIEN) 10 MG tablet TAKE 1 TABLET BY MOUTH EVERY DAY AT BEDTIME AS NEEDED 90 tablet 1   No current facility-administered medications on file prior to visit.        ROS:  All others reviewed and negative.  Objective        PE:  BP 128/76 (BP Location: Right Arm, Patient Position: Sitting, Cuff Size: Large)   Pulse 92   Temp 98.3 F (36.8 C) (Oral)   Ht '5\' 8"'$  (1.727 m)   Wt 246 lb (111.6 kg)   SpO2 94%   BMI 37.40 kg/m                 Constitutional: Pt appears in NAD               HENT: Head: NCAT.                Right Ear: External ear normal.                 Left Ear: External ear normal.                Eyes: . Pupils are equal, round, and reactive to light. Conjunctivae and EOM are normal               Nose: without d/c or deformity               Neck: Neck supple. Gross normal ROM               Cardiovascular: Normal rate and regular rhythm.                 Pulmonary/Chest: Effort normal and breath sounds without rales or wheezing.                Abd:  Soft, NT, ND, + BS, no organomegaly               Neurological: Pt is alert. At baseline orientation, motor grossly intact               Skin: Skin is warm. No rashes, no other new  lesions, LE edema - none               Psychiatric: Pt behavior is normal without agitation   Micro: none  Cardiac tracings I have personally interpreted today:  none  Pertinent Radiological findings (summarize): none   Lab Results  Component Value Date   WBC 6.6 11/10/2021   HGB 11.2 11/10/2021   HCT 33.4 (L) 11/10/2021   PLT 240 11/10/2021   GLUCOSE 98 11/10/2021   CHOL 220 (H) 02/15/2021   TRIG 79.0 02/15/2021    HDL 87.80 02/15/2021   LDLDIRECT 109.9 04/03/2012   LDLCALC 117 (H) 02/15/2021   ALT 21 11/10/2021   AST 17 11/10/2021   NA 138 11/10/2021   K 3.7 11/10/2021   CL 100 11/10/2021   CREATININE 1.07 (H) 11/10/2021   BUN 16 11/10/2021   CO2 22 11/10/2021   TSH 1.01 02/15/2021   INR 1.0 06/29/2008   HGBA1C 5.6 02/15/2021   Assessment/Plan:  REXINE GOWENS is a 66 y.o. Black or African American [2] female with  has a past medical history of ALLERGIC RHINITIS (12/18/2006), Anxiety, Arthritis, DEPRESSION, SITUATIONAL (03/15/2007), GERD (gastroesophageal reflux disease), Headache, History of kidney stones, HYPERLIPIDEMIA (12/18/2006), Hypertension, HYPERTENSION (12/18/2006), INSOMNIA-SLEEP DISORDER-UNSPEC (08/25/2009), Pain in knee region after total knee replacement ([place]) (04/03/2012), Pneumonia, and RENAL CALCULUS, HX OF (12/18/2006).  Spinal stenosis of lumbar region [facility] for increased gabapentin to 400 mg tid,  to f/u any worsening symptoms or concerns   Chronic low back pain with left-sided sciatica Also for new pain clinic referral, and limited 1 mo only rx for hydrocodone 10//325 mg qid prn  Hyperglycemia Lab Results  Component Value Date   HGBA1C 5.6 02/15/2021   Stable, pt to continue current medical treatment  - diet, wt control, and f/u lab today  HLD (hyperlipidemia) Lab Results  Component Value Date   LDLCALC 117 (H) 02/15/2021   Uncontrolled, pt to continue current statin lipitor 20 mg and check lipids   Vitamin D deficiency Last vitamin D Lab Results  Component Value Date   VD25OH 37.72 02/15/2021   Low, reminded to start oral replacement  Followup: Return in about 6 months (around 06/20/2022).  Cathlean Cower, MD 12/19/2021 10:31 AM [facility]

## 2021-12-19 NOTE — Assessment & Plan Note (Signed)
Lab Results  Component Value Date   HGBA1C 5.6 02/15/2021   Stable, pt to continue current medical treatment  - diet, wt control, and f/u lab today

## 2021-12-19 NOTE — Patient Instructions (Addendum)
You had the flu shot today  Ok to increase the gabapentin to 400 mg three times per day as discussed  Please take all new medication as prescribed - the hydrcodone as needed  You will be contacted regarding the referral for: Pain clinic  Please continue all other medications as before, and refills have been done if requested.  Please have the pharmacy call with any other refills you may need.  Please keep your appointments with your specialists as you may have planned  Please have your lab testing done at the Lab  You will be contacted by phone if any changes need to be made immediately.  Otherwise, you will receive a letter about your results with an explanation, but please check with MyChart first.  Please remember to sign up for MyChart if you have not done so, as this will be important to you in the future with finding out test results, communicating by private email, and scheduling acute appointments online when needed.  Please make an Appointment to return in 6 months, or sooner if needed

## 2021-12-19 NOTE — Assessment & Plan Note (Signed)
Last vitamin D Lab Results  Component Value Date   VD25OH 37.72 02/15/2021   Low, reminded to start oral replacement

## 2021-12-21 ENCOUNTER — Telehealth: Payer: Self-pay

## 2021-12-21 NOTE — Telephone Encounter (Signed)
Patient returned call, gave message below. Will call and schedule lab appointment next week.

## 2021-12-22 ENCOUNTER — Other Ambulatory Visit: Payer: Self-pay | Admitting: Internal Medicine

## 2021-12-22 NOTE — Telephone Encounter (Signed)
Please refill as per office routine med refill policy (all routine meds to be refilled for 3 mo or monthly (per pt preference) up to one year from last visit, then month to month grace period for 3 mo, then further med refills will have to be denied) ? ?

## 2021-12-26 ENCOUNTER — Telehealth: Payer: Self-pay | Admitting: Internal Medicine

## 2021-12-26 MED ORDER — HYDROCODONE-ACETAMINOPHEN 10-325 MG PO TABS: 1.0000 | ORAL_TABLET | Freq: Three times a day (TID) | ORAL | 0 refills | Status: AC | PRN

## 2021-12-26 NOTE — Telephone Encounter (Signed)
Done erx 

## 2021-12-26 NOTE — Telephone Encounter (Signed)
[facility] [address]

## 2021-12-26 NOTE — Telephone Encounter (Signed)
Patient called concerning her hydrocodone - [facility] is out but [facility] [address] has the medication - please resend.

## 2021-12-28 ENCOUNTER — Other Ambulatory Visit: Payer: Self-pay | Admitting: Internal Medicine

## 2022-01-03 ENCOUNTER — Telehealth: Payer: Self-pay | Admitting: Internal Medicine

## 2022-01-03 NOTE — Telephone Encounter (Signed)
Pt called to report [facility] not [facility] in stock. Pt requesting RX for hydrocodone be sent to a different pharmacy. Please call pt to advise of outcome.

## 2022-01-04 MED ORDER — HYDROCODONE-ACETAMINOPHEN 10-325 MG PO TABS: 1.0000 | ORAL_TABLET | Freq: Four times a day (QID) | ORAL | 0 refills | Status: DC | PRN

## 2022-01-04 NOTE — Telephone Encounter (Signed)
Please send hydrocodone to another pharmacy I assume either [facility] on [address] or the [facility] on [address]. Patient did not answer when I called to verify a back pharmacy.

## 2022-01-04 NOTE — Telephone Encounter (Signed)
Ok done erx 

## 2022-01-04 NOTE — Addendum Note (Signed)
Addended by: Biagio Borg on: 01/04/2022 04:24 PM   Modules accepted: Orders

## 2022-01-16 ENCOUNTER — Other Ambulatory Visit: Payer: Self-pay | Admitting: Internal Medicine

## 2022-01-16 NOTE — Telephone Encounter (Signed)
Please refill as per office routine med refill policy (all routine meds to be refilled for 3 mo or monthly (per pt preference) up to one year from last visit, then month to month grace period for 3 mo, then further med refills will have to be denied) ? ?

## 2022-01-27 ENCOUNTER — Other Ambulatory Visit: Payer: Self-pay | Admitting: Internal Medicine

## 2022-01-29 ENCOUNTER — Other Ambulatory Visit: Payer: Self-pay | Admitting: Physician Assistant

## 2022-01-30 NOTE — Telephone Encounter (Signed)
Next Visit: 04/12/2022  Last Visit: 11/10/2021  Last Fill: 11/15/2021 (no print dose change)  DX: Rheumatoid arthritis involving multiple sites with positive rheumatoid factor   Current Dose per office note on 11/10/2021: Methotrexate 5 tablets by mouth once weekly  Labs: 11/11/2021 lab note: Creatinine is elevated-1.07 and GFR is low-57. Rest of CMP WNL. Please advise the patient to reduce methotrexate to 4 tablets once weekly. Avoid the use of NSAIDs. Recheck BMP with GFR in 1 month. Hematocrit is borderline low. Rest of CBC WNL. 12/19/2021: RBC 3.76, hemoglobin 10.9, HCT 32.9, GFR 50.40  Okay to refill methotrexate?

## 2022-02-04 ENCOUNTER — Other Ambulatory Visit: Payer: Self-pay | Admitting: Internal Medicine

## 2022-02-07 ENCOUNTER — Telehealth: Payer: Self-pay | Admitting: Internal Medicine

## 2022-02-07 DIAGNOSIS — G8929 Other chronic pain: Secondary | ICD-10-CM

## 2022-02-07 NOTE — Telephone Encounter (Signed)
Patient called and said that she talked to Pain Management and they said that a new referral would have to be sent in because they could not contact patient before the last referral expired.

## 2022-02-09 NOTE — Telephone Encounter (Signed)
Ok done

## 2022-02-09 NOTE — Progress Notes (Signed)
Office Visit Note  Patient: Pamela [name]             Date of Birth: 10-10-1955           MRN: [id number]             PCP: Biagio Borg, MD Referring: Biagio Borg, MD Visit Date: 02/22/2022 Occupation: '@GUAROCC'$ @  Subjective:  Pain in both legs  History of Present Illness: Pamela [name] is a 66 y.o. female with history of seropositive rheumatoid arthritis, osteoarthritis and degenerative disc disease.  She has been taking Humira 40 mg subcu every other week along with methotrexate 4 tablets p.o. weekly and folic acid on a regular basis.  She states she continues to have a lot of discomfort in the muscles of her lower extremities.  She complains of pain and discomfort in her bilateral knee joints especially her right knee joint.  She also has chronic discomfort in her hip region which she describes over the trochanteric bursa.  She has not noticed any joint swelling.  She continues to have lower back pain.  Her left total knee replacement is doing well.  Activities of Daily Living:  Patient reports morning stiffness for all day. Patient Reports nocturnal pain.  Difficulty dressing/grooming: Denies Difficulty climbing stairs: Reports Difficulty getting out of chair: Reports Difficulty using hands for taps, buttons, cutlery, and/or writing: Denies  Review of Systems  Constitutional:  Positive for fatigue.  HENT:  Positive for mouth dryness. Negative for mouth sores.   Eyes:  Positive for dryness.  Respiratory:  Negative for shortness of breath.   Cardiovascular:  Negative for chest pain and palpitations.  Gastrointestinal:  Negative for blood in stool, constipation and diarrhea.  Endocrine: Negative for increased urination.  Genitourinary:  Negative for involuntary urination.  Musculoskeletal:  Positive for joint pain, gait problem, joint pain, myalgias, muscle weakness, morning stiffness, muscle tenderness and myalgias. Negative for joint swelling.  Skin:  Positive for sensitivity  to sunlight. Negative for color change, rash and hair loss.  Allergic/Immunologic: Negative for susceptible to infections.  Neurological:  Positive for headaches. Negative for dizziness.  Hematological:  Negative for swollen glands.  Psychiatric/Behavioral:  Positive for depressed mood and sleep disturbance. The patient is nervous/anxious.     PMFS History:  Patient Active Problem List   Diagnosis Date Noted   Chronic low back pain with left-sided sciatica 12/19/2021   Vitamin D deficiency 04/03/2021   Cough 03/29/2021   Acute upper respiratory infection 02/15/2021   Snoring 02/15/2021   Spinal stenosis of lumbar region 08/05/2019   Inability to walk 08/05/2019   Hives 07/08/2019   History of glaucoma 04/24/2019   Bursitis of left hip 12/25/2018   MGUS (monoclonal gammopathy of unknown significance) 05/11/2018   Rheumatoid arthritis involving multiple sites with positive rheumatoid factor ([place]) 05/03/2018   High risk medication use 05/03/2018   Status post total left knee replacement 05/03/2018   DDD (degenerative disc disease), lumbar 05/03/2018   Primary osteoarthritis of right knee 04/05/2018   Arthritis of left wrist 12/19/2017   Left rotator cuff tear 12/19/2017   Left wrist pain 12/12/2017   Left shoulder pain 08/22/2017   Acute pain of right knee 04/17/2017   Pain of left calf 04/13/2017   S/P left TK revision 10/09/2016   Hyperglycemia 09/23/2015   Anxiety state 09/23/2015   Abnormal urine odor 08/25/2015   GERD without esophagitis 08/25/2015   Upper abdominal pain 04/16/2015   Early satiety 04/16/2015  Dysuria 04/16/2015   Encounter for well adult exam with abnormal findings 06/18/2014   Greater trochanteric bursitis of right hip 01/06/2013   Left lumbar radiculitis 04/03/2012   Eczema 04/03/2012   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   BURSITIS, RIGHT HIP 08/25/2009   INSOMNIA-SLEEP DISORDER-UNSPEC 08/25/2009   FATIGUE 08/25/2009   Cervicalgia 08/14/2007   Depression  03/15/2007   HLD (hyperlipidemia) 12/18/2006   Essential hypertension 12/18/2006   Allergic rhinitis 12/18/2006   RENAL CALCULUS, HX OF 12/18/2006    Past Medical History:  Diagnosis Date   ALLERGIC RHINITIS 12/18/2006   Qualifier: Diagnosis of  By: Danny Lawless CMA, [place]     Anxiety    Arthritis    DEPRESSION, SITUATIONAL 03/15/2007   Qualifier: Diagnosis of  By: Wynona Luna    GERD (gastroesophageal reflux disease)    Headache    occasional   History of kidney stones    HYPERLIPIDEMIA 12/18/2006   Qualifier: History of  By: [place], [place]     Hypertension    HYPERTENSION 12/18/2006   Qualifier: Diagnosis of  By: [place], [place]     INSOMNIA-SLEEP [place] 08/25/2009   Qualifier: Diagnosis of  By: Jenny Reichmann MD, Hunt Oris    Pain in knee region after total knee replacement ([place]) 04/03/2012   Pneumonia    RENAL CALCULUS, HX OF 12/18/2006   Qualifier: Diagnosis of  By: Danny Lawless CMA, [place]      Family History  Problem Relation Age of Onset   Stroke Mother    Heart disease Father    Lung cancer Sister    Lung cancer Brother    Healthy Daughter    Healthy Daughter    Healthy Daughter    Colon cancer Neg Hx    Esophageal cancer Neg Hx    Pancreatic cancer Neg Hx    Stomach cancer Neg Hx    Past Surgical History:  Procedure Laterality Date   CERVICAL DISC SURGERY     x 3   CONVERSION TO TOTAL KNEE Left 10/09/2016   Procedure: Conversion left uni compartment arthroplasty to total knee arthroplasty;  Surgeon: Paralee Cancel, MD;  Location: [facility];  Service: Orthopedics;  Laterality: Left;  90 mins   KNEE ARTHROSCOPY     partial   total left knee arthoplasty     10/09/16 Dr. Alvan Dame   Social History   Social History Narrative   Not on file   Immunization History  Administered Date(s) Administered   Fluad Quad(high Dose 65+) 04/16/2020, 12/24/2020, 12/19/2021   Influenza Whole 11/12/2012   Influenza,inj,Quad PF,6+ Mos 11/29/2016, 12/11/2017, 12/25/2018    Influenza-Unspecified 12/15/2013, 12/12/2015, 11/29/2016   Td 11/03/2002   Tdap 09/23/2015   Zoster Recombinat (Shingrix) 03/22/2018, 08/20/2018   Zoster, Live 08/12/2012     Objective: Vital Signs: BP 132/89 (BP Location: Left Arm, Patient Position: Sitting, Cuff Size: Large)   Pulse (!) 105   Resp 16   Ht '5\' 8"'$  (1.727 m)   Wt 237 lb 6.4 oz (107.7 kg)   BMI 36.10 kg/m    Physical Exam Vitals and nursing note reviewed.  Constitutional:      Appearance: She is well-developed.  HENT:     Head: Normocephalic and atraumatic.  Eyes:     Conjunctiva/sclera: Conjunctivae normal.  Cardiovascular:     Rate and Rhythm: Normal rate and regular rhythm.     Heart sounds: Normal heart sounds.  Pulmonary:     Effort: Pulmonary effort is normal.     Breath sounds:  Normal breath sounds.  Abdominal:     General: Bowel sounds are normal.     Palpations: Abdomen is soft.  Musculoskeletal:     Cervical back: Normal range of motion.  Lymphadenopathy:     Cervical: No cervical adenopathy.  Skin:    General: Skin is warm and dry.     Capillary Refill: Capillary refill takes less than 2 seconds.  Neurological:     Mental Status: She is alert and oriented to person, place, and time.  Psychiatric:        Behavior: Behavior normal.      Musculoskeletal Exam: Cervical spine was in good range of motion.  She had no tenderness over thoracic or lumbar spine.  Shoulder joints, elbow joints, wrist joints with good range of motion.  MCPs, PIPs and DIPs been good range of motion with no synovitis.  She good range of motion of bilateral hip joints.  She had tenderness over right trochanteric bursa.  She had warmth on palpation of her right knee joint.  Left knee joint was replaced and had been doing well.  There was no tenderness over ankles or MTPs.  CDAI Exam: CDAI Score: 1.8  Patient Global: 6 mm; Provider Global: 2 mm Swollen: 0 ; Tender: 1  Joint Exam 02/22/2022      Right  Left  Knee    Tender        Investigation: No additional findings.  Imaging: No results found.  Recent Labs: Lab Results  Component Value Date   WBC 6.5 12/19/2021   HGB 10.9 (L) 12/19/2021   PLT 202.0 12/19/2021   NA 139 12/19/2021   K 3.5 12/19/2021   CL 103 12/19/2021   CO2 28 12/19/2021   GLUCOSE 88 12/19/2021   BUN 23 12/19/2021   CREATININE 1.13 12/19/2021   BILITOT 0.4 12/19/2021   ALKPHOS 64 12/19/2021   AST 21 12/19/2021   ALT 27 12/19/2021   PROT 7.0 12/19/2021   ALBUMIN 3.8 12/19/2021   CALCIUM 9.6 12/19/2021   GFRAA 74 05/14/2020   QFTBGOLDPLUS Negative 11/10/2021    Speciality Comments: PLQ Eye Exam: 03/26/2019 WNL @ [facility] OD PA FOllow up in 1 year  Procedures:  No procedures performed Allergies: Cymbalta [duloxetine hcl]   Assessment / Plan:     Visit Diagnoses: Rheumatoid arthritis involving multiple sites with positive rheumatoid factor (HCC) - ANA 1:160 Cytoplasmic, RF 116, sed rate 22: Patient continues to take Mirra 40 mg subcu every other week and methotrexate on a regular basis.  She continues to have pain and discomfort in her right knee joint.  None of the other joints are painful or swollen.  She states the pain is mostly in the muscles of her lower extremities.  High risk medication use - Humira 40 mg sq injections every 14 days-started 12/01/19, Methotrexate 4 tablets by mouth once weekly, and folic acid 1 mg po daily.  Labs from December 19, 2021 were reviewed.  Hemoglobin was low at 10.9.  CMP was normal.  TB Gold was negative on November 10, 2021.  She was advised to get repeat labs in January and every 3 months to monitor for drug toxicity.  Information on immunization was placed in the AVS.  She was advised to hold Humira and methotrexate if she develops an infection and resume after the infection resolves.  Annual skin examination to screen for skin cancer was advised.  Use of sunscreen was advised.  Primary osteoarthritis of right knee - X-rays  obtained in March 2023 were consistent with severe osteoarthritis.  She continues to have pain and discomfort in her right knee joint.  She had warmth on palpation of the right knee joint.  She does not plan to undergo total knee replacement yet.  I offered cortisone injection which she declined.  S/P left TK revision-well.  Trochanteric bursitis, right hip-she continues to have some IT band discomfort.  Stretching exercises were advised.  Traumatic incomplete tear of left rotator cuff, subsequent encounter-she has off-and-on discomfort in her shoulder joint.  She had good range of motion today.  DDD (degenerative disc disease), lumbar -she complains of chronic lower back pain.  Mild multilevel degenerative disc disease with disc space narrowing at L3-L4, facet hypertrophy in the lower lumbar spine, and levoconvex rotatory scoliosis.  Essential hypertension-blood pressure was normal today.  Other medical problems are listed as follows:  History of hyperlipidemia  History of gastroesophageal reflux (GERD)  History of glaucoma  Vitamin D deficiency  Other proteinuria  Anxiety and depression  Orders: No orders of the defined types were placed in this encounter.  No orders of the defined types were placed in this encounter.    Follow-Up Instructions: Return in about 5 months (around 07/24/2022) for Rheumatoid arthritis, Osteoarthritis.   Bo Merino, MD  Note - This record has been created using Editor, commissioning.  Chart creation errors have been sought, but may not always  have been located. Such creation errors do not reflect on  the standard of medical care.

## 2022-02-13 ENCOUNTER — Telehealth: Payer: Self-pay | Admitting: Internal Medicine

## 2022-02-13 MED ORDER — HYDROCODONE-ACETAMINOPHEN 10-325 MG PO TABS: 1.0000 | ORAL_TABLET | Freq: Four times a day (QID) | ORAL | 0 refills | Status: DC | PRN

## 2022-02-13 NOTE — Telephone Encounter (Signed)
Done erx 

## 2022-02-13 NOTE — Telephone Encounter (Signed)
Caller & Relationship to patient: Self  Call back number: [phone]   Date of last office visit: 10.9.23  Date of next office visit: 4.9.24  Medication(s) to be refilled:  HYDROcodone-acetaminophen [facility]) 10-325 MG tablet   Preferred Pharmacy:  [facility] [phone]   Phone: [phone]  Fax: [phone]    Pt states she is having px in her low back and legs

## 2022-02-22 ENCOUNTER — Ambulatory Visit: Payer: Medicare HMO | Attending: Rheumatology | Admitting: Rheumatology

## 2022-02-22 ENCOUNTER — Encounter: Payer: Self-pay | Admitting: Rheumatology

## 2022-02-22 VITALS — BP 132/89 | HR 105 | Resp 16 | Ht 68.0 in | Wt 237.4 lb

## 2022-02-22 DIAGNOSIS — R808 Other proteinuria: Secondary | ICD-10-CM

## 2022-02-22 DIAGNOSIS — F419 Anxiety disorder, unspecified: Secondary | ICD-10-CM

## 2022-02-22 DIAGNOSIS — Z8639 Personal history of other endocrine, nutritional and metabolic disease: Secondary | ICD-10-CM | POA: Diagnosis not present

## 2022-02-22 DIAGNOSIS — F32A Depression, unspecified: Secondary | ICD-10-CM

## 2022-02-22 DIAGNOSIS — Z8669 Personal history of other diseases of the nervous system and sense organs: Secondary | ICD-10-CM

## 2022-02-22 DIAGNOSIS — Z79899 Other long term (current) drug therapy: Secondary | ICD-10-CM | POA: Diagnosis not present

## 2022-02-22 DIAGNOSIS — M1711 Unilateral primary osteoarthritis, right knee: Secondary | ICD-10-CM | POA: Diagnosis not present

## 2022-02-22 DIAGNOSIS — Z96652 Presence of left artificial knee joint: Secondary | ICD-10-CM | POA: Diagnosis not present

## 2022-02-22 DIAGNOSIS — Z8719 Personal history of other diseases of the digestive system: Secondary | ICD-10-CM | POA: Diagnosis not present

## 2022-02-22 DIAGNOSIS — I10 Essential (primary) hypertension: Secondary | ICD-10-CM

## 2022-02-22 DIAGNOSIS — M7061 Trochanteric bursitis, right hip: Secondary | ICD-10-CM | POA: Diagnosis not present

## 2022-02-22 DIAGNOSIS — M0579 Rheumatoid arthritis with rheumatoid factor of multiple sites without organ or systems involvement: Secondary | ICD-10-CM | POA: Diagnosis not present

## 2022-02-22 DIAGNOSIS — E559 Vitamin D deficiency, unspecified: Secondary | ICD-10-CM

## 2022-02-22 DIAGNOSIS — M5136 Other intervertebral disc degeneration, lumbar region: Secondary | ICD-10-CM

## 2022-02-22 DIAGNOSIS — S46012D Strain of muscle(s) and tendon(s) of the rotator cuff of left shoulder, subsequent encounter: Secondary | ICD-10-CM

## 2022-02-22 NOTE — Patient Instructions (Signed)
Standing Labs We placed an order today for your standing lab work.   Please have your standing labs drawn in January and every 3 months  Please have your labs drawn 2 weeks prior to your appointment so that the provider can discuss your lab results at your appointment.  Please note that you may see your imaging and lab results in [place] before we have reviewed them. We will contact you once all results are reviewed. Please allow our office up to 72 hours to thoroughly review all of the results before contacting the office for clarification of your results.  Lab hours are:   Monday through Thursday from 8:00 am -12:30 pm and 1:00 pm-5:00 pm and Friday from 8:00 am-12:00 pm.  Please be advised, all patients with office appointments requiring lab work will take precedent over walk-in lab work.   Labs are drawn by [facility]. Please bring your co-pay at the time of your lab draw.  You may receive a bill from [facility] for your lab work.  Please note if you are on Hydroxychloroquine and and an order has been placed for a Hydroxychloroquine level, you will need to have it drawn 4 hours or more after your last dose.  If you wish to have your labs drawn at another location, please call the office 24 hours in advance so we can fax the orders.  The office is located at [address], [place], [place], [place] [address] No appointment is necessary.    If you have any questions regarding directions or hours of operation,  please call [phone].   As a reminder, please drink plenty of water prior to coming for your lab work. Thanks!   Vaccines You are taking a medication(s) that can suppress your immune system.  The following immunizations are recommended: Flu annually Covid-19  Td/Tdap (tetanus, diphtheria, pertussis) every 10 years Pneumonia (Prevnar 15 then Pneumovax 23 at least 1 year apart.  Alternatively, can take Prevnar 20 without needing additional dose) Shingrix: 2 doses from 4 weeks  to 6 months apart  Please check with your PCP to make sure you are up to date.   If you have signs or symptoms of an infection or start antibiotics: First, call your PCP for workup of your infection. Hold your medication through the infection, until you complete your antibiotics, and until symptoms resolve if you take the following: Injectable medication (Actemra, Benlysta, Cimzia, Cosentyx, Enbrel, Humira, Kevzara, Orencia, Remicade, Simponi, Stelara, Taltz, Tremfya) Methotrexate Leflunomide (Arava) Mycophenolate (Cellcept) Morrie Sheldon, Olumiant, or Rinvoq  Please get an annual skin examination to screen for skin cancer while you are on Humira

## 2022-02-23 ENCOUNTER — Encounter: Payer: Self-pay | Admitting: Physical Medicine & Rehabilitation

## 2022-02-23 ENCOUNTER — Other Ambulatory Visit: Payer: Self-pay | Admitting: Rheumatology

## 2022-03-13 ENCOUNTER — Telehealth: Payer: Self-pay | Admitting: Internal Medicine

## 2022-03-13 HISTORY — PX: TOOTH EXTRACTION: SUR596

## 2022-03-15 ENCOUNTER — Other Ambulatory Visit: Payer: Self-pay | Admitting: Rheumatology

## 2022-03-15 ENCOUNTER — Other Ambulatory Visit: Payer: Self-pay | Admitting: Internal Medicine

## 2022-03-15 NOTE — Telephone Encounter (Signed)
Please refill as per office routine med refill policy (all routine meds to be refilled for 3 mo or monthly (per pt preference) up to one year from last visit, then month to month grace period for 3 mo, then further med refills will have to be denied) ? ?

## 2022-03-16 NOTE — Telephone Encounter (Signed)
Spoke with pharmacy who states that the order was received and is kept on file because patient received a 90 days supply at the end of November. Left message for a call back to inform patient

## 2022-03-16 NOTE — Telephone Encounter (Signed)
Patient called in and said she talked to someone at the pharmacy yesterday and they said that they didn't have it and asked for [place] to send again. Please advise

## 2022-03-23 ENCOUNTER — Telehealth (INDEPENDENT_AMBULATORY_CARE_PROVIDER_SITE_OTHER): Payer: Medicare HMO | Admitting: Nurse Practitioner

## 2022-03-23 VITALS — BP 157/99 | Ht 68.0 in

## 2022-03-23 DIAGNOSIS — R051 Acute cough: Secondary | ICD-10-CM | POA: Diagnosis not present

## 2022-03-23 NOTE — Progress Notes (Signed)
Established Patient Office Visit  An audio-only tele-health visit was completed today for this patient. I connected with  Pamela [name] on 03/23/22 utilizing audio-only technology and verified that I am speaking with the correct person using two identifiers. The patient was located at their home, and I was located at the office of [place] [facility] during the encounter. I discussed the limitations of evaluation and management by telemedicine. The patient expressed understanding and agreed to proceed.   **Use of video technology was attempted however was not successful (patient stated she never received text message to open video visit, my CMA did attempt to send text message multiple times), audio only visit was completed.  Subjective   Patient ID: Pamela [name], female    DOB: [birth year]  Age: 67 y.o. MRN: [id number]  Chief Complaint  Patient presents with   Cough    Coughing, expel some mucus ( clear), no body aches, cold chills  Lose of appetite to eat for the pas three days     Symptom onset 5-6 days ago, started with cough. Patient feels disease course is progressing and she is getting worse. Reports she has had chills, cold sweats, fatigue/malaise, chest congestion, productive cough. Denies shortness of breath. Has been vaccinated against flu, no covid/RSV vaccine history. Has been taking mucinex-DM over the counter.     Review of Systems  Constitutional:  Positive for chills, diaphoresis and malaise/fatigue.  HENT:  Positive for congestion.   Respiratory:  Positive for cough and sputum production (mucus, clear/thick). Negative for shortness of breath.   Cardiovascular:  Positive for chest pain (burning). Negative for palpitations.  Gastrointestinal:  Positive for diarrhea (2 times/day). Negative for nausea and vomiting.  Musculoskeletal:  Positive for falls. Negative for myalgias.  Neurological:  Positive for headaches.      Objective:     BP (!) 157/99    Ht '5\' 8"'$  (1.727 m)   BMI 36.10 kg/m  BP Readings from Last 3 Encounters:  03/23/22 (!) 157/99  02/22/22 132/89  12/19/21 128/76   Wt Readings from Last 3 Encounters:  02/22/22 237 lb 6.4 oz (107.7 kg)  12/19/21 246 lb (111.6 kg)  11/10/21 240 lb 12.8 oz (109.2 kg)      Physical Exam Comprehensive physical exam not completed today as office visit was conducted remotely.  Patient sounded well over the phone, she did not appear to have any signs of respiratory distress over the phone.  Additionally, she did not cough during the visit.  Patient was alert and oriented, and appeared to have appropriate judgment.   No results found for any visits on 03/23/22.    The 10-year ASCVD risk score (Arnett DK, et al., 2019) is: 13.3%    Assessment & Plan:   Problem List Items Addressed This Visit       Other   Cough - Primary    Acute, concern for viral respiratory infection versus pneumonia.  Would prefer patient be seen in person, however no openings on schedule tomorrow.  We will order chest x-ray and patient will come to office to have x-ray completed tomorrow.  She was told that if her symptoms worsen in any way such as high fever, shortness of breath, worsening with coughing she should proceed to the emergency department.  She reports understanding.  Will consider additional treatment options pending x-ray results.  Did consider trial of steroids however patient's blood pressure is quite elevated today so steroids were not prescribed.  Patient will continue to rest, hydrate, use Tylenol as needed for fever or chills, and continue with her Mucinex DM for cough.  Patient does have an appointment with her primary care provider in approximately 5 days, keep appointment as scheduled.      Relevant Orders   DG Chest 2 View    Return As scheduled on 03/28/2022.  Total time spent on the telephone today was 15 minutes and 37 seconds.   Ailene Ards, NP

## 2022-03-23 NOTE — Assessment & Plan Note (Addendum)
Acute, concern for viral respiratory infection versus pneumonia.  Would prefer patient be seen in person, however no openings on schedule tomorrow.  We will order chest x-ray and patient will come to office to have x-ray completed tomorrow.  She was told that if her symptoms worsen in any way such as high fever, shortness of breath, worsening with coughing she should proceed to the emergency department.  She reports understanding.  Will consider additional treatment options pending x-ray results.  Did consider trial of steroids however patient's blood pressure is quite elevated today so steroids were not prescribed.  Patient will continue to rest, hydrate, use Tylenol as needed for fever or chills, and continue with her Mucinex DM for cough.  Patient does have an appointment with her primary care provider in approximately 5 days, keep appointment as scheduled.

## 2022-03-27 ENCOUNTER — Ambulatory Visit: Payer: Medicare HMO | Admitting: Internal Medicine

## 2022-03-28 ENCOUNTER — Ambulatory Visit: Payer: Medicare HMO | Admitting: Internal Medicine

## 2022-03-30 NOTE — Progress Notes (Deleted)
Office Visit Note  Patient: Pamela [name]             Date of Birth: September 18, 1955           MRN: [id number]             PCP: Biagio Borg, MD Referring: Biagio Borg, MD Visit Date: 04/12/2022 Occupation: '@GUAROCC'$ @  Subjective:    History of Present Illness: Pamela [name] is a 67 y.o. female with history of seropositive rheumatoid arthritis and DDD.  She remains on Humira 40 mg sq injections every 14 days-started 12/01/19, Methotrexate 4 tablets by mouth once weekly, and folic acid 1 mg po daily.   CBC, hepatic function panel, and BMP updated on 12/19/21. Orders for CBC and CMP released today.   TB gold negative on 11/10/21.  Discussed the importance of holding humira and methotrexate if she develops signs or symptoms of an infection and to resume once the infection has completely cleared.   Activities of Daily Living:  Patient reports morning stiffness for *** {minute/hour:19697}.   Patient {ACTIONS;DENIES/REPORTS:21021675::"Denies"} nocturnal pain.  Difficulty dressing/grooming: {ACTIONS;DENIES/REPORTS:21021675::"Denies"} Difficulty climbing stairs: {ACTIONS;DENIES/REPORTS:21021675::"Denies"} Difficulty getting out of chair: {ACTIONS;DENIES/REPORTS:21021675::"Denies"} Difficulty using hands for taps, buttons, cutlery, and/or writing: {ACTIONS;DENIES/REPORTS:21021675::"Denies"}  No Rheumatology ROS completed.   PMFS History:  Patient Active Problem List   Diagnosis Date Noted   Chronic low back pain with left-sided sciatica 12/19/2021   Vitamin D deficiency 04/03/2021   Cough 03/29/2021   Acute upper respiratory infection 02/15/2021   Snoring 02/15/2021   Spinal stenosis of lumbar region 08/05/2019   Inability to walk 08/05/2019   Hives 07/08/2019   History of glaucoma 04/24/2019   Bursitis of left hip 12/25/2018   MGUS (monoclonal gammopathy of unknown significance) 05/11/2018   Rheumatoid arthritis involving multiple sites with positive rheumatoid factor ([place]) 05/03/2018    High risk medication use 05/03/2018   Status post total left knee replacement 05/03/2018   DDD (degenerative disc disease), lumbar 05/03/2018   Primary osteoarthritis of right knee 04/05/2018   Arthritis of left wrist 12/19/2017   Left rotator cuff tear 12/19/2017   Left wrist pain 12/12/2017   Left shoulder pain 08/22/2017   Acute pain of right knee 04/17/2017   Pain of left calf 04/13/2017   S/P left TK revision 10/09/2016   Hyperglycemia 09/23/2015   Anxiety state 09/23/2015   Abnormal urine odor 08/25/2015   GERD without esophagitis 08/25/2015   Upper abdominal pain 04/16/2015   Early satiety 04/16/2015   Dysuria 04/16/2015   Encounter for well adult exam with abnormal findings 06/18/2014   Greater trochanteric bursitis of right hip 01/06/2013   Left lumbar radiculitis 04/03/2012   Eczema 04/03/2012   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   BURSITIS, RIGHT HIP 08/25/2009   INSOMNIA-SLEEP DISORDER-UNSPEC 08/25/2009   FATIGUE 08/25/2009   Cervicalgia 08/14/2007   Depression 03/15/2007   HLD (hyperlipidemia) 12/18/2006   Essential hypertension 12/18/2006   Allergic rhinitis 12/18/2006   RENAL CALCULUS, HX OF 12/18/2006    Past Medical History:  Diagnosis Date   ALLERGIC RHINITIS 12/18/2006   Qualifier: Diagnosis of  By: Danny Lawless CMA, [place]     Anxiety    Arthritis    DEPRESSION, SITUATIONAL 03/15/2007   Qualifier: Diagnosis of  By: Wynona Luna    GERD (gastroesophageal reflux disease)    Headache    occasional   History of kidney stones    HYPERLIPIDEMIA 12/18/2006   Qualifier: History of  By: [place], [place]  Hypertension    HYPERTENSION 12/18/2006   Qualifier: Diagnosis of  By: [place], [place]     INSOMNIA-SLEEP [place] 08/25/2009   Qualifier: Diagnosis of  By: Jenny Reichmann MD, [place] Oris    Pain in knee region after total knee replacement ([place]) 04/03/2012   Pneumonia    RENAL CALCULUS, HX OF 12/18/2006   Qualifier: Diagnosis of  By: Danny Lawless CMA, [place]       Family History  Problem Relation Age of Onset   Stroke Mother    Heart disease Father    Lung cancer Sister    Lung cancer Brother    Healthy Daughter    Healthy Daughter    Healthy Daughter    Colon cancer Neg Hx    Esophageal cancer Neg Hx    Pancreatic cancer Neg Hx    Stomach cancer Neg Hx    Past Surgical History:  Procedure Laterality Date   CERVICAL DISC SURGERY     x 3   CONVERSION TO TOTAL KNEE Left 10/09/2016   Procedure: Conversion left uni compartment arthroplasty to total knee arthroplasty;  Surgeon: Paralee Cancel, MD;  Location: [facility];  Service: Orthopedics;  Laterality: Left;  90 mins   KNEE ARTHROSCOPY     partial   total left knee arthoplasty     10/09/16 Dr. Alvan Dame   Social History   Social History Narrative   Not on file   Immunization History  Administered Date(s) Administered   Fluad Quad(high Dose 65+) 04/16/2020, 12/24/2020, 12/19/2021   Influenza Whole 11/12/2012   Influenza,inj,Quad PF,6+ Mos 11/29/2016, 12/11/2017, 12/25/2018   Influenza-Unspecified 12/15/2013, 12/12/2015, 11/29/2016   Td 11/03/2002   Tdap 09/23/2015   Zoster Recombinat (Shingrix) 03/22/2018, 08/20/2018   Zoster, Live 08/12/2012     Objective: Vital Signs: There were no vitals taken for this visit.   Physical Exam Vitals and nursing note reviewed.  Constitutional:      Appearance: She is well-developed.  HENT:     Head: Normocephalic and atraumatic.  Eyes:     Conjunctiva/sclera: Conjunctivae normal.  Cardiovascular:     Rate and Rhythm: Normal rate and regular rhythm.     Heart sounds: Normal heart sounds.  Pulmonary:     Effort: Pulmonary effort is normal.     Breath sounds: Normal breath sounds.  Abdominal:     General: Bowel sounds are normal.     Palpations: Abdomen is soft.  Musculoskeletal:     Cervical back: Normal range of motion.  Skin:    General: Skin is warm and dry.     Capillary Refill: Capillary refill takes less than 2 seconds.   Neurological:     Mental Status: She is alert and oriented to person, place, and time.  Psychiatric:        Behavior: Behavior normal.      Musculoskeletal Exam: ***  CDAI Exam: CDAI Score: -- Patient Global: --; Provider Global: -- Swollen: --; Tender: -- Joint Exam 04/12/2022   No joint exam has been documented for this visit   There is currently no information documented on the homunculus. Go to the Rheumatology activity and complete the homunculus joint exam.  Investigation: No additional findings.  Imaging: No results found.  Recent Labs: Lab Results  Component Value Date   WBC 6.5 12/19/2021   HGB 10.9 (L) 12/19/2021   PLT 202.0 12/19/2021   NA 139 12/19/2021   K 3.5 12/19/2021   CL 103 12/19/2021   CO2 28 12/19/2021   GLUCOSE 88  12/19/2021   BUN 23 12/19/2021   CREATININE 1.13 12/19/2021   BILITOT 0.4 12/19/2021   ALKPHOS 64 12/19/2021   AST 21 12/19/2021   ALT 27 12/19/2021   PROT 7.0 12/19/2021   ALBUMIN 3.8 12/19/2021   CALCIUM 9.6 12/19/2021   GFRAA 74 05/14/2020   QFTBGOLDPLUS Negative 11/10/2021    Speciality Comments: PLQ Eye Exam: 03/26/2019 WNL @ [facility] M [place] OD PA FOllow up in 1 year  Procedures:  No procedures performed Allergies: Cymbalta [duloxetine hcl]   Assessment / Plan:     Visit Diagnoses: No diagnosis found.  Orders: No orders of the defined types were placed in this encounter.  No orders of the defined types were placed in this encounter.   Face-to-face time spent with patient was *** minutes. Greater than 50% of time was spent in counseling and coordination of care.  Follow-Up Instructions: No follow-ups on file.   Earnestine Mealing, CMA  Note - This record has been created using Editor, commissioning.  Chart creation errors have been sought, but may not always  have been located. Such creation errors do not reflect on  the standard of medical care.

## 2022-03-31 ENCOUNTER — Ambulatory Visit (INDEPENDENT_AMBULATORY_CARE_PROVIDER_SITE_OTHER): Payer: Medicare HMO | Admitting: Internal Medicine

## 2022-03-31 VITALS — BP 118/80 | HR 97 | Temp 98.8°F | Ht 68.0 in | Wt 227.6 lb

## 2022-03-31 DIAGNOSIS — R051 Acute cough: Secondary | ICD-10-CM | POA: Diagnosis not present

## 2022-03-31 DIAGNOSIS — R739 Hyperglycemia, unspecified: Secondary | ICD-10-CM | POA: Diagnosis not present

## 2022-03-31 DIAGNOSIS — I10 Essential (primary) hypertension: Secondary | ICD-10-CM

## 2022-03-31 DIAGNOSIS — E559 Vitamin D deficiency, unspecified: Secondary | ICD-10-CM

## 2022-03-31 DIAGNOSIS — U071 COVID-19: Secondary | ICD-10-CM

## 2022-03-31 LAB — POCT RESPIRATORY SYNCYTIAL VIRUS: RSV Rapid Ag: NEGATIVE

## 2022-03-31 LAB — POC COVID19 BINAXNOW: SARS Coronavirus 2 Ag: POSITIVE — AB

## 2022-03-31 LAB — POCT INFLUENZA A/B
Influenza A, POC: NEGATIVE
Influenza B, POC: NEGATIVE

## 2022-03-31 MED ORDER — NIRMATRELVIR/RITONAVIR (PAXLOVID) TABLET (RENAL DOSING): 2.0000 | ORAL_TABLET | Freq: Two times a day (BID) | ORAL | 0 refills | Status: AC

## 2022-03-31 MED ORDER — HYDROCODONE BIT-HOMATROP MBR 5-1.5 MG/5ML PO SOLN: 5.0000 mL | Freq: Four times a day (QID) | ORAL | 0 refills | Status: AC | PRN

## 2022-03-31 NOTE — Patient Instructions (Signed)
Your COVID test was Positive today   Please take all new medication as prescribed - the paxlovid antibiotic for covid, and cough medicine as needed  Please continue all other medications as before, and refills have been done if requested.  Please have the pharmacy call with any other refills you may need.  Please keep your appointments with your specialists as you may have planned

## 2022-03-31 NOTE — Progress Notes (Signed)
Patient ID: Pamela [name], female   DOB: 1955-08-01, 67 y.o.   MRN: [id number]        Chief Complaint: follow up productive cough x 2-3 wks, htn, hyperglycemia, low vit d       HPI:  Pamela [name] is a 67 y.o. female Here with acute onset mild to mod 2-3 wks ST, HA, general weakness and malaise, with prod cough greenish sputum, but Pt denies chest pain, increased sob or doe, wheezing, orthopnea, PND, increased LE swelling, palpitations, dizziness or syncope.  Pt denies polydipsia, polyuria, or new focal neuro s/s.    Pt denies fever, wt loss, night sweats, loss of appetite, or other constitutional symptoms         Wt Readings from Last 3 Encounters:  03/31/22 227 lb 9.6 oz (103.2 kg)  02/22/22 237 lb 6.4 oz (107.7 kg)  12/19/21 246 lb (111.6 kg)   BP Readings from Last 3 Encounters:  03/31/22 118/80  03/23/22 (!) 157/99  02/22/22 132/89         Past Medical History:  Diagnosis Date   ALLERGIC RHINITIS 12/18/2006   Qualifier: Diagnosis of  By: Danny Lawless CMA, [place]     Anxiety    Arthritis    DEPRESSION, SITUATIONAL 03/15/2007   Qualifier: Diagnosis of  By: Wynona Luna    GERD (gastroesophageal reflux disease)    Headache    occasional   History of kidney stones    HYPERLIPIDEMIA 12/18/2006   Qualifier: History of  By: [place], [place]     Hypertension    HYPERTENSION 12/18/2006   Qualifier: Diagnosis of  By: [place], [place]     INSOMNIA-SLEEP [place] 08/25/2009   Qualifier: Diagnosis of  By: Jenny Reichmann MD, [place] Oris    Pain in knee region after total knee replacement ([place]) 04/03/2012   Pneumonia    RENAL CALCULUS, HX OF 12/18/2006   Qualifier: Diagnosis of  By: [place], [place]     Past Surgical History:  Procedure Laterality Date   CERVICAL DISC SURGERY     x 3   CONVERSION TO TOTAL KNEE Left 10/09/2016   Procedure: Conversion left uni compartment arthroplasty to total knee arthroplasty;  Surgeon: Paralee Cancel, MD;  Location: [facility];  Service:  Orthopedics;  Laterality: Left;  90 mins   KNEE ARTHROSCOPY     partial   total left knee arthoplasty     10/09/16 Dr. Alvan Dame    reports that she quit smoking about 54 years ago. Her smoking use included cigarettes. She has a 0.75 pack-year smoking history. She has never been exposed to tobacco smoke. She has never used smokeless tobacco. She reports current alcohol use. She reports that she does not use drugs. family history includes Healthy in her daughter, daughter, and daughter; Heart disease in her father; Lung cancer in her brother and sister; Stroke in her mother. Allergies  Allergen Reactions   Cymbalta [Duloxetine Hcl] Other (See Comments)    Feeling weird   Current Outpatient Medications on File Prior to Visit  Medication Sig Dispense Refill   Adalimumab (HUMIRA PEN) 40 MG/0.4ML PNKT Inject 40 mg into the skin every 14 (fourteen) days. 6 each 0   ALPRAZolam (XANAX) 0.25 MG tablet TAKE 1 TABLET BY MOUTH TWICE A DAY AS NEEDED 60 tablet 2   atorvastatin (LIPITOR) 20 MG tablet TAKE 1 TABLET BY MOUTH EVERY DAY 90 tablet 3   citalopram (CELEXA) 40 MG tablet TAKE 1 TABLET BY MOUTH EVERY DAY  90 tablet 3   cyclobenzaprine (FLEXERIL) 5 MG tablet TAKE 1 TABLET BY MOUTH THREE TIMES A DAY AS NEEDED FOR MUSCLE SPASMS 90 tablet 2   diclofenac (VOLTAREN) 75 MG EC tablet Take 1 tablet by mouth 2 (two) times daily with a meal.     gabapentin (NEURONTIN) 400 MG capsule TAKE 1 CAPSULE BY MOUTH 3 TIMES A DAY 270 capsule 3   hydrochlorothiazide (MICROZIDE) 12.5 MG capsule TAKE 1 CAPSULE(12.5 MG) BY MOUTH DAILY 90 capsule 3   losartan (COZAAR) 100 MG tablet TAKE 1 TABLET BY MOUTH EVERY DAY 90 tablet 3   methotrexate (RHEUMATREX) 2.5 MG tablet Take 4 tablets (10 mg total) by mouth once a week. 48 tablet 0   metoCLOPramide (REGLAN) 10 MG tablet TAKE 1 TABLET BY MOUTH IN THE MORNING AND IN THE EVENING 180 tablet 3   Multiple Vitamin (MULTIVITAMIN WITH MINERALS) TABS tablet Take 1 tablet by mouth daily.      pantoprazole (PROTONIX) 40 MG tablet Take 1 tablet (40 mg total) by mouth 2 (two) times daily. 180 tablet 3   traMADol (ULTRAM) 50 MG tablet TAKE 1 TABLET BY MOUTH EVERY 6 HOURS AS NEEDED. FOR PAIN 120 tablet 2   traZODone (DESYREL) 50 MG tablet TAKE 1/2 TO 1 TABLET BY MOUTH AT BEDTIME AS NEEDED FOR SLEEP 90 tablet 1   zolpidem (AMBIEN) 10 MG tablet TAKE 1 TABLET BY MOUTH EVERY DAY AT BEDTIME AS NEEDED 90 tablet 1   HYDROcodone-acetaminophen (NORCO) 10-325 MG tablet Take 1 tablet by mouth every 6 (six) hours as needed. (Patient not taking: Reported on 03/31/2022) 120 tablet 0   sertraline (ZOLOFT) 100 MG tablet TAKE 1 TABLET BY MOUTH EVERY DAY (Patient not taking: Reported on 03/31/2022) 90 tablet 3   XALATAN 0.005 % ophthalmic solution Place 1 drop into the right eye at bedtime.  (Patient not taking: Reported on 03/31/2022)     No current facility-administered medications on file prior to visit.        ROS:  All others reviewed and negative.  Objective        PE:  BP 118/80 (BP Location: Left Arm, Patient Position: Sitting, Cuff Size: Large)   Pulse 97   Temp 98.8 F (37.1 C) (Oral)   Ht '5\' 8"'$  (1.727 m)   Wt 227 lb 9.6 oz (103.2 kg)   SpO2 95%   BMI 34.61 kg/m                 Constitutional: Pt appears in NAD               HENT: Head: NCAT.                Right Ear: External ear normal.                 Left Ear: External ear normal. Bilat tm's with mild erythema.  Max sinus areas mild tender.  Pharynx with mild erythema, no exudate               Eyes: . Pupils are equal, round, and reactive to light. Conjunctivae and EOM are normal               Nose: without d/c or deformity               Neck: Neck supple. Gross normal ROM               Cardiovascular: Normal rate and regular rhythm.  Pulmonary/Chest: Effort normal and breath sounds without rales or wheezing.                Abd:  Soft, NT, ND, + BS, no organomegaly               Neurological: Pt is alert. At baseline  orientation, motor grossly intact               Skin: Skin is warm. No rashes, no other new lesions, LE edema - none               Psychiatric: Pt behavior is normal without agitation   Micro: none  Cardiac tracings I have personally interpreted today:  none  Pertinent Radiological findings (summarize): none   Lab Results  Component Value Date   WBC 6.5 12/19/2021   HGB 10.9 (L) 12/19/2021   HCT 32.9 (L) 12/19/2021   PLT 202.0 12/19/2021   GLUCOSE 88 12/19/2021   CHOL 172 12/19/2021   TRIG 68.0 12/19/2021   HDL 80.60 12/19/2021   LDLDIRECT 109.9 04/03/2012   LDLCALC 78 12/19/2021   ALT 27 12/19/2021   AST 21 12/19/2021   NA 139 12/19/2021   K 3.5 12/19/2021   CL 103 12/19/2021   CREATININE 1.13 12/19/2021   BUN 23 12/19/2021   CO2 28 12/19/2021   TSH 2.12 12/19/2021   INR 1.0 06/29/2008   HGBA1C 5.7 12/19/2021   POCT - COVID - Positive, Flu - neg, RSV - neg  Assessment/Plan:  BRANDA CHAUDHARY is a 67 y.o. Black or African American [2] female with  has a past medical history of ALLERGIC RHINITIS (12/18/2006), Anxiety, Arthritis, DEPRESSION, SITUATIONAL (03/15/2007), GERD (gastroesophageal reflux disease), Headache, History of kidney stones, HYPERLIPIDEMIA (12/18/2006), Hypertension, HYPERTENSION (12/18/2006), INSOMNIA-SLEEP DISORDER-UNSPEC (08/25/2009), Pain in knee region after total knee replacement ([place]) (04/03/2012), Pneumonia, and RENAL CALCULUS, HX OF (12/18/2006).  Essential hypertension BP Readings from Last 3 Encounters:  03/31/22 118/80  03/23/22 (!) 157/99  02/22/22 132/89   Stable, pt to continue medical treatment hct 12.5 mg qd, losartan 100 qd   Hyperglycemia Lab Results  Component Value Date   HGBA1C 5.7 12/19/2021   Stable, pt to continue current medical treatment  - diet, wt control   Vitamin D deficiency Last vitamin D Lab Results  Component Value Date   VD25OH 30.36 12/19/2021   Low, to start oral replacement   COVID-19 virus  infection Mild to mod, for paxlovid course, cough med prn,,  to f/u any worsening symptoms or concerns  Followup: Return if symptoms worsen or fail to improve.  Cathlean Cower, MD 04/01/2022 7:32 PM [facility]

## 2022-04-01 ENCOUNTER — Encounter: Payer: Self-pay | Admitting: Internal Medicine

## 2022-04-01 NOTE — Assessment & Plan Note (Signed)
Mild to mod, for paxlovid course, cough med prn,,  to f/u any worsening symptoms or concerns

## 2022-04-01 NOTE — Assessment & Plan Note (Signed)
BP Readings from Last 3 Encounters:  03/31/22 118/80  03/23/22 (!) 157/99  02/22/22 132/89   Stable, pt to continue medical treatment hct 12.5 mg qd, losartan 100 qd

## 2022-04-01 NOTE — Assessment & Plan Note (Signed)
Last vitamin D Lab Results  Component Value Date   VD25OH 30.36 12/19/2021   Low, to start oral replacement

## 2022-04-01 NOTE — Assessment & Plan Note (Signed)
Lab Results  Component Value Date   HGBA1C 5.7 12/19/2021   Stable, pt to continue current medical treatment  - diet, wt control

## 2022-04-03 ENCOUNTER — Telehealth: Payer: Self-pay | Admitting: Internal Medicine

## 2022-04-03 NOTE — Telephone Encounter (Signed)
Patitent called and said that the hydrocodone cough syrup that he called in on the 19th was to high.  She would like [facility] [address].   Please advise.  Patients number:  [phone]

## 2022-04-04 MED ORDER — PROMETHAZINE-CODEINE 6.25-10 MG/5ML PO SYRP: 5.0000 mL | ORAL_SOLUTION | Freq: Four times a day (QID) | ORAL | 0 refills | Status: AC | PRN

## 2022-04-04 MED ORDER — PROMETHAZINE-CODEINE 6.25-10 MG/5ML PO SYRP: 5.0000 mL | ORAL_SOLUTION | Freq: Four times a day (QID) | ORAL | 0 refills | Status: DC | PRN

## 2022-04-04 NOTE — Telephone Encounter (Signed)
Ok this is done 

## 2022-04-04 NOTE — Telephone Encounter (Signed)
Patient could not afford the Hydrocodone, please send in something else if possible

## 2022-04-05 ENCOUNTER — Other Ambulatory Visit: Payer: Self-pay | Admitting: Rheumatology

## 2022-04-05 NOTE — Telephone Encounter (Signed)
Patient called and said pharmacy needs approval to fill promethazine-codeine (PHENERGAN WITH CODEINE) 6.25-10 MG/5ML syrup. Please contact pharmacy.

## 2022-04-06 ENCOUNTER — Encounter: Payer: Medicare HMO | Attending: Physical Medicine & Rehabilitation | Admitting: Physical Medicine & Rehabilitation

## 2022-04-07 ENCOUNTER — Telehealth: Payer: Self-pay

## 2022-04-07 ENCOUNTER — Other Ambulatory Visit (HOSPITAL_COMMUNITY): Payer: Self-pay

## 2022-04-07 NOTE — Telephone Encounter (Signed)
Patient Advocate Encounter  Prior authorization is required for Promethazine-Codeine 6.25-'10MG'$ /5ML syrup. PA submitted and DENIED on 04/07/22.  Key [id number]

## 2022-04-07 NOTE — Telephone Encounter (Signed)
PA submitted and denied on 04/07/22. Drugs used for symptomatic relief of cough and colds are excluded from Medicare Part D coverage.

## 2022-04-10 ENCOUNTER — Telehealth: Payer: Self-pay | Admitting: Internal Medicine

## 2022-04-10 NOTE — Telephone Encounter (Signed)
Patient would like a callback in reference to a prior authorization for one of her medications, best callback number is [phone].

## 2022-04-11 MED ORDER — GUAIFENESIN-DM 100-10 MG/5ML PO SYRP: 5.0000 mL | ORAL_SOLUTION | ORAL | 1 refills | Status: DC | PRN

## 2022-04-11 NOTE — Telephone Encounter (Signed)
[place] for Robitussin DM - done erx

## 2022-04-11 NOTE — Telephone Encounter (Signed)
Called pt no answer LMOM RTC.../lmb 

## 2022-04-11 NOTE — Telephone Encounter (Signed)
See previous msg sent to MD to see if anything else could be call in.Marland KitchenJohny Chess PA submitted and denied on 04/07/22. Drugs used for symptomatic relief of cough and colds are excluded from Medicare Part D coverag

## 2022-04-12 ENCOUNTER — Ambulatory Visit: Payer: Medicare HMO | Attending: Physician Assistant | Admitting: Physician Assistant

## 2022-04-12 DIAGNOSIS — M1711 Unilateral primary osteoarthritis, right knee: Secondary | ICD-10-CM

## 2022-04-12 DIAGNOSIS — I10 Essential (primary) hypertension: Secondary | ICD-10-CM

## 2022-04-12 DIAGNOSIS — M7061 Trochanteric bursitis, right hip: Secondary | ICD-10-CM

## 2022-04-12 DIAGNOSIS — S46012D Strain of muscle(s) and tendon(s) of the rotator cuff of left shoulder, subsequent encounter: Secondary | ICD-10-CM

## 2022-04-12 DIAGNOSIS — Z96652 Presence of left artificial knee joint: Secondary | ICD-10-CM

## 2022-04-12 DIAGNOSIS — R808 Other proteinuria: Secondary | ICD-10-CM

## 2022-04-12 DIAGNOSIS — E559 Vitamin D deficiency, unspecified: Secondary | ICD-10-CM

## 2022-04-12 DIAGNOSIS — M0579 Rheumatoid arthritis with rheumatoid factor of multiple sites without organ or systems involvement: Secondary | ICD-10-CM

## 2022-04-12 DIAGNOSIS — Z8639 Personal history of other endocrine, nutritional and metabolic disease: Secondary | ICD-10-CM

## 2022-04-12 DIAGNOSIS — F32A Depression, unspecified: Secondary | ICD-10-CM

## 2022-04-12 DIAGNOSIS — M5136 Other intervertebral disc degeneration, lumbar region: Secondary | ICD-10-CM

## 2022-04-12 DIAGNOSIS — Z8719 Personal history of other diseases of the digestive system: Secondary | ICD-10-CM

## 2022-04-12 DIAGNOSIS — Z8669 Personal history of other diseases of the nervous system and sense organs: Secondary | ICD-10-CM

## 2022-04-12 DIAGNOSIS — Z79899 Other long term (current) drug therapy: Secondary | ICD-10-CM

## 2022-04-13 NOTE — Telephone Encounter (Signed)
Patient called back to follow up, would like a call when available [phone].

## 2022-04-13 NOTE — Telephone Encounter (Signed)
Called pt back inform her was calling abt cough syrup MD sent to pharmacy 04/11/22.Marland KitchenJohny Chess

## 2022-04-17 ENCOUNTER — Telehealth: Payer: Self-pay | Admitting: *Deleted

## 2022-04-17 NOTE — Telephone Encounter (Signed)
Patient contacted the office and left a message requesting a refill on MTX.   Patient is due for labs.  Left message to advise patient she is due for labs. Advised patient we can refill her medication after labs have been updated.

## 2022-04-19 ENCOUNTER — Other Ambulatory Visit: Payer: Self-pay | Admitting: *Deleted

## 2022-04-19 DIAGNOSIS — Z79899 Other long term (current) drug therapy: Secondary | ICD-10-CM

## 2022-04-20 ENCOUNTER — Other Ambulatory Visit: Payer: Self-pay

## 2022-04-20 LAB — COMPLETE METABOLIC PANEL WITH GFR
AG Ratio: 1.4 (calc) (ref 1.0–2.5)
ALT: 20 U/L (ref 6–29)
AST: 17 U/L (ref 10–35)
Albumin: 3.8 g/dL (ref 3.6–5.1)
Alkaline phosphatase (APISO): 52 U/L (ref 37–153)
BUN: 14 mg/dL (ref 7–25)
CO2: 28 mmol/L (ref 20–32)
Calcium: 9.4 mg/dL (ref 8.6–10.4)
Chloride: 106 mmol/L (ref 98–110)
Creat: 0.95 mg/dL (ref 0.50–1.05)
Globulin: 2.7 g/dL (calc) (ref 1.9–3.7)
Glucose, Bld: 91 mg/dL (ref 65–99)
Potassium: 3.8 mmol/L (ref 3.5–5.3)
Sodium: 142 mmol/L (ref 135–146)
Total Bilirubin: 0.4 mg/dL (ref 0.2–1.2)
Total Protein: 6.5 g/dL (ref 6.1–8.1)
eGFR: 66 mL/min/{1.73_m2} (ref >=60)

## 2022-04-20 LAB — CBC WITH DIFFERENTIAL/PLATELET
Absolute Monocytes: 653 cells/uL (ref 200–950)
Basophils Absolute: 98 cells/uL (ref 0–200)
Basophils Relative: 1.6 %
Eosinophils Absolute: 281 cells/uL (ref 15–500)
Eosinophils Relative: 4.6 %
HCT: 30.5 % — ABNORMAL LOW (ref 35.0–45.0)
Hemoglobin: 10.4 g/dL — ABNORMAL LOW (ref 11.7–15.5)
Lymphs Abs: 1501 cells/uL (ref 850–3900)
MCH: 30 pg (ref 27.0–33.0)
MCHC: 34.1 g/dL (ref 32.0–36.0)
MCV: 87.9 fL (ref 80.0–100.0)
MPV: 11.2 fL (ref 7.5–12.5)
Monocytes Relative: 10.7 %
Neutro Abs: 3569 cells/uL (ref 1500–7800)
Neutrophils Relative %: 58.5 %
Platelets: 212 10*3/uL (ref 140–400)
RBC: 3.47 10*6/uL — ABNORMAL LOW (ref 3.80–5.10)
RDW: 14.2 % (ref 11.0–15.0)
Total Lymphocyte: 24.6 %
WBC: 6.1 10*3/uL (ref 3.8–10.8)

## 2022-04-20 MED ORDER — METHOTREXATE SODIUM 2.5 MG PO TABS: 10.0000 mg | ORAL_TABLET | ORAL | 0 refills | Status: DC

## 2022-04-20 NOTE — Telephone Encounter (Signed)
Patient came for labs and while discussing results she has requested her refill for [facility]TX. [facility]  [address] She will not need a call back as she will check with pharmacy later today.

## 2022-04-20 NOTE — Progress Notes (Signed)
Hemoglobin is low and stable.  Please advise patient to take multivitamin with iron.  CMP is normal.

## 2022-04-20 NOTE — Telephone Encounter (Signed)
Next Visit: 07/25/2022  Last Visit: 02/22/2022  Last Fill: 01/31/2022  DX: Rheumatoid arthritis involving multiple sites with positive rheumatoid factor   Current Dose per office note 02/22/2022: Methotrexate 4 tablets by mouth once weekly   Labs: 04/19/2022 Hemoglobin is low and stable. CMP is normal.   Okay to refill MTX?

## 2022-05-08 ENCOUNTER — Other Ambulatory Visit: Payer: Self-pay | Admitting: *Deleted

## 2022-05-08 DIAGNOSIS — M0579 Rheumatoid arthritis with rheumatoid factor of multiple sites without organ or systems involvement: Secondary | ICD-10-CM

## 2022-05-08 MED ORDER — HUMIRA (2 PEN) 40 MG/0.4ML ~~LOC~~ AJKT: 40.0000 mg | AUTO-INJECTOR | SUBCUTANEOUS | 0 refills | Status: DC

## 2022-05-08 NOTE — Telephone Encounter (Signed)
Refill request received via fax from [facility] for Humira  Next Visit: 07/25/2022  Last Visit: 02/22/2022  Last Fill: 11/21/2021  [id number] arthritis involving multiple sites with positive rheumatoid factor   Current Dose per office note 02/22/2022: Humira 40 mg sq injections every 14 days   Labs: 04/19/2022 Hemoglobin is low and stable. CMP is normal.   TB Gold: 11/10/2021 Neg    Okay to refill Humira?

## 2022-05-17 ENCOUNTER — Other Ambulatory Visit: Payer: Self-pay | Admitting: Internal Medicine

## 2022-05-17 NOTE — Telephone Encounter (Signed)
Please refill as per office routine med refill policy (all routine meds to be refilled for 3 mo or monthly (per pt preference) up to one year from last visit, then month to month grace period for 3 mo, then further med refills will have to be denied) ? ?

## 2022-06-04 ENCOUNTER — Other Ambulatory Visit: Payer: Self-pay | Admitting: Internal Medicine

## 2022-06-04 ENCOUNTER — Other Ambulatory Visit: Payer: Self-pay | Admitting: Physician Assistant

## 2022-06-06 ENCOUNTER — Other Ambulatory Visit: Payer: Self-pay | Admitting: Internal Medicine

## 2022-06-07 ENCOUNTER — Telehealth: Payer: Self-pay | Admitting: Internal Medicine

## 2022-06-07 NOTE — Telephone Encounter (Signed)
Prescription Request  06/07/2022  LOV: 03/31/2022  What is the name of the medication or equipment? ALPRAZolam (XANAX) 0.25 MG tablet   Have you contacted your pharmacy to request a refill? No   Which pharmacy would you like this sent to?     [facility] notified that [id number] [place], [place] [address] [place] [address]  Phone: [phone] Fax: [phone]  request is being sent to the clinical staff for review and that they should receive a response within 2 business days.   Please advise at Mobile [phone] (mobile)

## 2022-06-08 NOTE — Telephone Encounter (Signed)
Already done mar 26 to [facility]

## 2022-06-15 ENCOUNTER — Ambulatory Visit (INDEPENDENT_AMBULATORY_CARE_PROVIDER_SITE_OTHER): Payer: Medicare HMO | Admitting: Internal Medicine

## 2022-06-15 ENCOUNTER — Encounter: Payer: Self-pay | Admitting: Internal Medicine

## 2022-06-15 VITALS — BP 130/78 | HR 84 | Temp 99.0°F | Ht 68.0 in | Wt 236.0 lb

## 2022-06-15 DIAGNOSIS — I10 Essential (primary) hypertension: Secondary | ICD-10-CM

## 2022-06-15 DIAGNOSIS — Z0001 Encounter for general adult medical examination with abnormal findings: Secondary | ICD-10-CM | POA: Diagnosis not present

## 2022-06-15 DIAGNOSIS — D649 Anemia, unspecified: Secondary | ICD-10-CM | POA: Diagnosis not present

## 2022-06-15 DIAGNOSIS — Z23 Encounter for immunization: Secondary | ICD-10-CM

## 2022-06-15 DIAGNOSIS — E782 Mixed hyperlipidemia: Secondary | ICD-10-CM | POA: Diagnosis not present

## 2022-06-15 DIAGNOSIS — M5416 Radiculopathy, lumbar region: Secondary | ICD-10-CM

## 2022-06-15 DIAGNOSIS — J309 Allergic rhinitis, unspecified: Secondary | ICD-10-CM | POA: Diagnosis not present

## 2022-06-15 DIAGNOSIS — E559 Vitamin D deficiency, unspecified: Secondary | ICD-10-CM

## 2022-06-15 DIAGNOSIS — G894 Chronic pain syndrome: Secondary | ICD-10-CM | POA: Diagnosis not present

## 2022-06-15 DIAGNOSIS — E538 Deficiency of other specified B group vitamins: Secondary | ICD-10-CM

## 2022-06-15 DIAGNOSIS — R739 Hyperglycemia, unspecified: Secondary | ICD-10-CM | POA: Diagnosis not present

## 2022-06-15 LAB — URINALYSIS, ROUTINE W REFLEX MICROSCOPIC
Bilirubin Urine: NEGATIVE
Hgb urine dipstick: NEGATIVE
Ketones, ur: NEGATIVE
Leukocytes,Ua: NEGATIVE
Nitrite: NEGATIVE
RBC / HPF: NONE SEEN (ref 0–?)
Specific Gravity, Urine: 1.015 (ref 1.000–1.030)
Total Protein, Urine: NEGATIVE
Urine Glucose: NEGATIVE
Urobilinogen, UA: 0.2 (ref 0.0–1.0)
pH: 6 (ref 5.0–8.0)

## 2022-06-15 LAB — CBC WITH DIFFERENTIAL/PLATELET
Basophils Absolute: 0.1 10*3/uL (ref 0.0–0.1)
Basophils Relative: 1.7 % (ref 0.0–3.0)
Eosinophils Absolute: 0.3 10*3/uL (ref 0.0–0.7)
Eosinophils Relative: 5.2 % — ABNORMAL HIGH (ref 0.0–5.0)
HCT: 31.6 % — ABNORMAL LOW (ref 36.0–46.0)
Hemoglobin: 10.8 g/dL — ABNORMAL LOW (ref 12.0–15.0)
Lymphocytes Relative: 16.2 % (ref 12.0–46.0)
Lymphs Abs: 0.8 10*3/uL (ref 0.7–4.0)
MCHC: 34.1 g/dL (ref 30.0–36.0)
MCV: 90.2 fl (ref 78.0–100.0)
Monocytes Absolute: 0.7 10*3/uL (ref 0.1–1.0)
Monocytes Relative: 13 % — ABNORMAL HIGH (ref 3.0–12.0)
Neutro Abs: 3.3 10*3/uL (ref 1.4–7.7)
Neutrophils Relative %: 63.9 % (ref 43.0–77.0)
Platelets: 170 10*3/uL (ref 150.0–400.0)
RBC: 3.5 Mil/uL — ABNORMAL LOW (ref 3.87–5.11)
RDW: 13.7 % (ref 11.5–15.5)
WBC: 5.1 10*3/uL (ref 4.0–10.5)

## 2022-06-15 LAB — LIPID PANEL
Cholesterol: 168 mg/dL (ref 0–200)
HDL: 73.3 mg/dL (ref >39.00)
LDL Cholesterol: 83 mg/dL (ref 0–99)
NonHDL: 94.51
Total CHOL/HDL Ratio: 2
Triglycerides: 58 mg/dL (ref 0.0–149.0)
VLDL: 11.6 mg/dL (ref 0.0–40.0)

## 2022-06-15 LAB — HEPATIC FUNCTION PANEL
ALT: 20 U/L (ref 0–35)
AST: 15 U/L (ref 0–37)
Albumin: 4.1 g/dL (ref 3.5–5.2)
Alkaline Phosphatase: 55 U/L (ref 39–117)
Bilirubin, Direct: 0.1 mg/dL (ref 0.0–0.3)
Total Bilirubin: 0.4 mg/dL (ref 0.2–1.2)
Total Protein: 6.9 g/dL (ref 6.0–8.3)

## 2022-06-15 LAB — IBC PANEL
Iron: 91 ug/dL (ref 42–145)
Saturation Ratios: 28.5 % (ref 20.0–50.0)
TIBC: 319.2 ug/dL (ref 250.0–450.0)
Transferrin: 228 mg/dL (ref 212.0–360.0)

## 2022-06-15 LAB — BASIC METABOLIC PANEL
BUN: 17 mg/dL (ref 6–23)
CO2: 28 mEq/L (ref 19–32)
Calcium: 9.6 mg/dL (ref 8.4–10.5)
Chloride: 106 mEq/L (ref 96–112)
Creatinine, Ser: 0.94 mg/dL (ref 0.40–1.20)
GFR: 62.64 mL/min (ref >60.00)
Glucose, Bld: 102 mg/dL — ABNORMAL HIGH (ref 70–99)
Potassium: 3.5 mEq/L (ref 3.5–5.1)
Sodium: 141 mEq/L (ref 135–145)

## 2022-06-15 LAB — VITAMIN D 25 HYDROXY (VIT D DEFICIENCY, FRACTURES): VITD: 22.14 ng/mL — ABNORMAL LOW (ref 30.00–100.00)

## 2022-06-15 LAB — HEMOGLOBIN A1C: Hgb A1c MFr Bld: 5.4 % (ref 4.6–6.5)

## 2022-06-15 LAB — MICROALBUMIN / CREATININE URINE RATIO
Creatinine,U: 62.7 mg/dL
Microalb Creat Ratio: 1.1 mg/g (ref 0.0–30.0)
Microalb, Ur: <0.7 mg/dL (ref 0.0–1.9)

## 2022-06-15 LAB — TSH: TSH: 0.64 u[IU]/mL (ref 0.35–5.50)

## 2022-06-15 LAB — VITAMIN B12: Vitamin B-12: 328 pg/mL (ref 211–911)

## 2022-06-15 LAB — FERRITIN: Ferritin: 127.2 ng/mL (ref 10.0–291.0)

## 2022-06-15 MED ORDER — PREDNISONE 10 MG PO TABS: ORAL_TABLET | ORAL | 0 refills | Status: DC

## 2022-06-15 MED ORDER — HYDROCODONE-ACETAMINOPHEN 10-325 MG PO TABS: 1.0000 | ORAL_TABLET | Freq: Four times a day (QID) | ORAL | 0 refills | Status: DC | PRN

## 2022-06-15 NOTE — Progress Notes (Signed)
Patient ID: CERES AILLS, female   DOB: June 08, 1955, 67 y.o.   MRN: [id number]         Chief Complaint:: wellness exam and Back Pain (Pinched nerve) , hyperglycemia, htn, hld, allergies       HPI:  Pamela [name] is a 67 y.o. female here for wellness exam; due for eye exam - pt to call, for prevnar 20 today, o/w up to date                        Also with worsening 1 mo left lbp with radiation to the LLE, tried ibuprofen, tramadol ; already taking gabapentin 400 tid for other pain.  Pain now 9/10 assocated with worsening LLE weakness and fall x 1 and generalized weakness.  Pt denies chest pain, increased sob or doe, wheezing, orthopnea, PND, increased LE swelling, palpitations, dizziness or syncope.   Pt denies polydipsia, polyuria, or new focal neuro s/s.    Pt denies fever, wt loss, night sweats, loss of appetite, or other constitutional symptoms  Does have several wks ongoing nasal allergy symptoms with clearish congestion, itch and sneezing, without fever, pain, ST, cough, swelling or wheezing.    Wt Readings from Last 3 Encounters:  06/15/22 236 lb (107 kg)  03/31/22 227 lb 9.6 oz (103.2 kg)  02/22/22 237 lb 6.4 oz (107.7 kg)   BP Readings from Last 3 Encounters:  06/15/22 130/78  03/31/22 118/80  03/23/22 (!) 157/99   Immunization History  Administered Date(s) Administered   Fluad Quad(high Dose 65+) 04/16/2020, 12/24/2020, 12/19/2021   Influenza Whole 11/12/2012   Influenza,inj,Quad PF,6+ Mos 11/29/2016, 12/11/2017, 12/25/2018   Influenza-Unspecified 12/15/2013, 12/12/2015, 11/29/2016   PNEUMOCOCCAL CONJUGATE-20 06/15/2022   Td 11/03/2002   Tdap 09/23/2015   Zoster Recombinat (Shingrix) 03/22/2018, 08/20/2018   Zoster, Live 08/12/2012   Health Maintenance Due  Topic Date Due   Medicare Annual Wellness (AWV)  08/11/2022      Past Medical History:  Diagnosis Date   ALLERGIC RHINITIS 12/18/2006   Qualifier: Diagnosis of  By: Genelle Gather CMA, [place]     Anxiety    Arthritis     DEPRESSION, SITUATIONAL 03/15/2007   Qualifier: Diagnosis of  By: Nena Jordan    GERD (gastroesophageal reflux disease)    Headache    occasional   History of kidney stones    HYPERLIPIDEMIA 12/18/2006   Qualifier: History of  By: Genelle Gather CMA, [place]     Hypertension    HYPERTENSION 12/18/2006   Qualifier: Diagnosis of  By: Genelle Gather CMA, [place]     INSOMNIA-SLEEP DISORDER-UNSPEC 08/25/2009   Qualifier: Diagnosis of  By: Jonny Ruiz MD, Len Blalock    Pain in knee region after total knee replacement 04/03/2012   Pneumonia    RENAL CALCULUS, HX OF 12/18/2006   Qualifier: Diagnosis of  By: Genelle Gather CMA, [place]     Past Surgical History:  Procedure Laterality Date   CERVICAL DISC SURGERY     x 3   CONVERSION TO TOTAL KNEE Left 10/09/2016   Procedure: Conversion left uni compartment arthroplasty to total knee arthroplasty;  Surgeon: Durene Romans, MD;  Location: [facility];  Service: Orthopedics;  Laterality: Left;  90 mins   KNEE ARTHROSCOPY     partial   total left knee arthoplasty     10/09/16 Dr. Charlann Boxer    reports that she quit smoking about 54 years ago. Her smoking use included cigarettes. She has a 0.75 pack-year smoking history.  She has never been exposed to tobacco smoke. She has never used smokeless tobacco. She reports current alcohol use. She reports that she does not use drugs. family history includes Healthy in her daughter, daughter, and daughter; Heart disease in her father; Lung cancer in her brother and sister; Stroke in her mother. Allergies  Allergen Reactions   Cymbalta [Duloxetine Hcl] Other (See Comments)    Feeling weird   Current Outpatient Medications on File Prior to Visit  Medication Sig Dispense Refill   Adalimumab (HUMIRA, 2 PEN,) 40 MG/0.4ML PNKT Inject 40 mg into the skin every 14 (fourteen) days. 6 each 0   ALPRAZolam (XANAX) 0.25 MG tablet TAKE 1 TABLET BY MOUTH TWICE A DAY AS NEEDED 60 tablet 2   atorvastatin (LIPITOR) 20 MG tablet TAKE 1 TABLET BY MOUTH  EVERY DAY 90 tablet 3   citalopram (CELEXA) 40 MG tablet TAKE 1 TABLET BY MOUTH EVERY DAY 90 tablet 3   cyclobenzaprine (FLEXERIL) 5 MG tablet TAKE 1 TABLET BY MOUTH THREE TIMES A DAY AS NEEDED FOR MUSCLE SPASM 90 tablet 2   diclofenac (VOLTAREN) 75 MG EC tablet Take 1 tablet by mouth 2 (two) times daily with a meal.     gabapentin (NEURONTIN) 400 MG capsule TAKE 1 CAPSULE BY MOUTH 3 TIMES A DAY 270 capsule 3   guaiFENesin-dextromethorphan (ROBITUSSIN DM) 100-10 MG/5ML syrup Take 5 mLs by mouth every 4 (four) hours as needed for cough. 118 mL 1   hydrochlorothiazide (MICROZIDE) 12.5 MG capsule TAKE 1 CAPSULE(12.5 MG) BY MOUTH DAILY 90 capsule 2   losartan (COZAAR) 100 MG tablet TAKE 1 TABLET BY MOUTH EVERY DAY 90 tablet 3   methotrexate (RHEUMATREX) 2.5 MG tablet Take 4 tablets (10 mg total) by mouth once a week. 48 tablet 0   metoCLOPramide (REGLAN) 10 MG tablet TAKE 1 TABLET BY MOUTH IN THE MORNING AND IN THE EVENING 180 tablet 3   Multiple Vitamin (MULTIVITAMIN WITH MINERALS) TABS tablet Take 1 tablet by mouth daily.     pantoprazole (PROTONIX) 40 MG tablet Take 1 tablet (40 mg total) by mouth 2 (two) times daily. 180 tablet 3   traMADol (ULTRAM) 50 MG tablet TAKE 1 TABLET BY MOUTH EVERY 6 HOURS AS NEEDED. FOR PAIN 120 tablet 2   traZODone (DESYREL) 50 MG tablet TAKE 1/2 TO 1 TABLET BY MOUTH AT BEDTIME AS NEEDED FOR SLEEP 90 tablet 1   zolpidem (AMBIEN) 10 MG tablet TAKE 1 TABLET BY MOUTH EVERY DAY AT BEDTIME AS NEEDED 90 tablet 1   sertraline (ZOLOFT) 100 MG tablet TAKE 1 TABLET BY MOUTH EVERY DAY (Patient not taking: Reported on 03/31/2022) 90 tablet 3   XALATAN 0.005 % ophthalmic solution Place 1 drop into the right eye at bedtime.  (Patient not taking: Reported on 03/31/2022)     No current facility-administered medications on file prior to visit.        ROS:  All others reviewed and negative.  Objective        PE:  BP 130/78   Pulse 84   Temp 99 F (37.2 C) (Oral)   Ht  (1.727  m)   Wt 236 lb (107 kg)   SpO2 95%   BMI 35.88 kg/m                 Constitutional: Pt appears in NAD               HENT: Head: NCAT.  Right Ear: External ear normal.                 Left Ear: External ear normal.                Eyes: . Pupils are equal, round, and reactive to light. Conjunctivae and EOM are normal               Nose: without d/c or deformity               Neck: Neck supple. Gross normal ROM               Cardiovascular: Normal rate and regular rhythm.                 Pulmonary/Chest: Effort normal and breath sounds without rales or wheezing.                Abd:  Soft, NT, ND, + BS, no organomegaly               Neurological: Pt is alert. At baseline orientation, motor with 4/5 motor weakness LELE               Skin: Skin is warm. No rashes, no other new lesions, LE edema - none               Psychiatric: Pt behavior is normal without agitation   Micro: none  Cardiac tracings I have personally interpreted today:  none  Pertinent Radiological findings (summarize): none   Lab Results  Component Value Date   WBC 5.1 06/15/2022   HGB 10.8 (L) 06/15/2022   HCT 31.6 (L) 06/15/2022   PLT 170.0 06/15/2022   GLUCOSE 102 (H) 06/15/2022   CHOL 168 06/15/2022   TRIG 58.0 06/15/2022   HDL 73.30 06/15/2022   LDLDIRECT 109.9 04/03/2012   LDLCALC 83 06/15/2022   ALT 20 06/15/2022   AST 15 06/15/2022   NA 141 06/15/2022   K 3.5 06/15/2022   CL 106 06/15/2022   CREATININE 0.94 06/15/2022   BUN 17 06/15/2022   CO2 28 06/15/2022   TSH 0.64 06/15/2022   INR 1.0 06/29/2008   HGBA1C 5.4 06/15/2022   MICROALBUR <0.7 06/15/2022   Assessment/Plan:  Pamela [name] is a 67 y.o. Black or African American [2] female with  has a past medical history of ALLERGIC RHINITIS (12/18/2006), Anxiety, Arthritis, DEPRESSION, SITUATIONAL (03/15/2007), GERD (gastroesophageal reflux disease), Headache, History of kidney stones, HYPERLIPIDEMIA (12/18/2006), Hypertension,  HYPERTENSION (12/18/2006), INSOMNIA-SLEEP DISORDER-UNSPEC (08/25/2009), Pain in knee region after total knee replacement (04/03/2012), Pneumonia, and RENAL CALCULUS, HX OF (12/18/2006).  Encounter for well adult exam with abnormal findings Age and sex appropriate education and counseling updated with regular exercise and diet Referrals for preventative services - none needed Immunizations addressed - for prevnar 20  Smoking counseling  - none needed Evidence for depression or other mood disorder - none significant Most recent labs reviewed. I have personally reviewed and have noted: 1) the patient's medical and social history 2) The patient's current medications and supplements 3) The patient's height, weight, and BMI have been recorded in the chart   Allergic rhinitis Also to improve with prednisone taper,  to f/u any worsening symptoms or concerns   Essential hypertension BP Readings from Last 3 Encounters:  06/15/22 130/78  03/31/22 118/80  03/23/22 (!) 157/99   Stable, pt to continue medical treatment hct 12.5 qd, losartan 100 qd   HLD (hyperlipidemia) Lab Results  Component Value  Date   LDLCALC 83 06/15/2022   Stable, pt to continue current statin lipitor 20 mg qd   Hyperglycemia Lab Results  Component Value Date   HGBA1C 5.4 06/15/2022   Stable, pt to continue current medical treatment  - diet, wt control   Vitamin D deficiency Last vitamin D Lab Results  Component Value Date   VD25OH 22.14 (L) 06/15/2022   Low, to start oral replacement   Left lumbar radiculopathy Chronic left sided pain with known lumbar stenosis, now with worsening pain, weakness to the LLE with fall x 1 - for vicodin 10/325 qid prn limited rx, prednisone taper, and MRI LS spine, consider surgical f/u  Followup: Return in about 6 months (around 12/15/2022).  Oliver BarreJames Luisdaniel Kenton, MD 06/18/2022 6:04 AM [facility]

## 2022-06-15 NOTE — Patient Instructions (Addendum)
You had the Prevnar 20 pneumonia shot today  Please take all new medication as prescribed  - the hydrocodone (vicodin), and prednisone  Please continue all other medications as before, and refills have been done if requested.  Please have the pharmacy call with any other refills you may need.  Please continue your efforts at being more active, low cholesterol diet, and weight control.  You are otherwise up to date with prevention measures today.  Please keep your appointments with your specialists as you may have planned  You will be contacted regarding the referral for: MRI for the lower back, and Neurosurgury referral  Please go to the LAB at the blood drawing area for the tests to be done  You will be contacted by phone if any changes need to be made immediately.  Otherwise, you will receive a letter about your results with an explanation, but please check with MyChart first.  Please remember to sign up for MyChart if you have not done so, as this will be important to you in the future with finding out test results, communicating by private email, and scheduling acute appointments online when needed.  Please make an Appointment to return in 6 months, or sooner if needed  Ok to CANCEL the Apr 9 appointment

## 2022-06-18 ENCOUNTER — Encounter: Payer: Self-pay | Admitting: Internal Medicine

## 2022-06-18 DIAGNOSIS — M5416 Radiculopathy, lumbar region: Secondary | ICD-10-CM | POA: Insufficient documentation

## 2022-06-18 NOTE — Assessment & Plan Note (Signed)
BP Readings from Last 3 Encounters:  06/15/22 130/78  03/31/22 118/80  03/23/22 (!) 157/99   Stable, pt to continue medical treatment hct 12.5 qd, losartan 100 qd

## 2022-06-18 NOTE — Assessment & Plan Note (Signed)
Last vitamin D Lab Results  Component Value Date   VD25OH 22.14 (L) 06/15/2022   Low, to start oral replacement

## 2022-06-18 NOTE — Assessment & Plan Note (Signed)
Lab Results  Component Value Date   LDLCALC 83 06/15/2022   Stable, pt to continue current statin lipitor 20 mg qd

## 2022-06-18 NOTE — Assessment & Plan Note (Signed)
Also to improve with prednisone taper,  to f/u any worsening symptoms or concerns 

## 2022-06-18 NOTE — Assessment & Plan Note (Signed)
Chronic left sided pain with known lumbar stenosis, now with worsening pain, weakness to the LLE with fall x 1 - for vicodin 10/325 qid prn limited rx, prednisone taper, and MRI LS spine, consider surgical f/u

## 2022-06-18 NOTE — Assessment & Plan Note (Signed)
Age and sex appropriate education and counseling updated with regular exercise and diet Referrals for preventative services - none needed Immunizations addressed - for prevnar 20 Smoking counseling  - none needed Evidence for depression or other mood disorder - none significant Most recent labs reviewed. I have personally reviewed and have noted: 1) the patient's medical and social history 2) The patient's current medications and supplements 3) The patient's height, weight, and BMI have been recorded in the chart  

## 2022-06-18 NOTE — Assessment & Plan Note (Signed)
Lab Results  Component Value Date   HGBA1C 5.4 06/15/2022   Stable, pt to continue current medical treatment  - diet, wt control

## 2022-06-20 ENCOUNTER — Ambulatory Visit: Payer: Medicare HMO | Admitting: Internal Medicine

## 2022-07-01 ENCOUNTER — Other Ambulatory Visit: Payer: Self-pay | Admitting: Physician Assistant

## 2022-07-03 NOTE — Telephone Encounter (Signed)
Last Fill: 04/20/2022  Labs: 06/15/2022 RBC 3.50, Hgb 10.8, Hct 31.6, Monocytes Relative 13.0, Eosinophils Relative 5.2, Glucose 102  Next Visit: 07/25/2022  Last Visit: 02/22/2022  DX: Rheumatoid arthritis involving multiple sites with positive rheumatoid factor   Current Dose per office note 02/22/2022: Methotrexate 4 tablets by mouth once weekly   Okay to refill Methotrexate?

## 2022-07-12 ENCOUNTER — Telehealth: Payer: Self-pay | Admitting: Internal Medicine

## 2022-07-12 NOTE — Telephone Encounter (Signed)
Called patient to schedule Medicare Annual Wellness Visit (AWV). Left message for patient to call back and schedule Medicare Annual Wellness Visit (AWV).  Last date of AWV: 08/10/2021  Please schedule an appointment at any time with [facility].  If any questions, please contact me at [phone].  Thank you ,  Randon Goldsmith Care Guide [facility] AWV TEAM Direct Dial: [phone]

## 2022-07-13 NOTE — Progress Notes (Deleted)
Office Visit Note  Patient: Pamela [name]             Date of Birth: 1955/05/15           MRN: [id number]             PCP: Corwin Levins, MD Referring: Corwin Levins, MD Visit Date: 07/25/2022 Occupation: @GUAROCC @  Subjective:  No chief complaint on file.   History of Present Illness: Pamela [name] is a 67 y.o. female ***     Activities of Daily Living:  Patient reports morning stiffness for *** {minute/hour:19697}.   Patient {ACTIONS;DENIES/REPORTS:21021675::"Denies"} nocturnal pain.  Difficulty dressing/grooming: {ACTIONS;DENIES/REPORTS:21021675::"Denies"} Difficulty climbing stairs: {ACTIONS;DENIES/REPORTS:21021675::"Denies"} Difficulty getting out of chair: {ACTIONS;DENIES/REPORTS:21021675::"Denies"} Difficulty using hands for taps, buttons, cutlery, and/or writing: {ACTIONS;DENIES/REPORTS:21021675::"Denies"}  No Rheumatology ROS completed.   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   Vitamin D deficiency 04/03/2021   Cough 03/29/2021   Acute upper respiratory infection 02/15/2021   Snoring 02/15/2021   Spinal stenosis of lumbar region 08/05/2019   Inability to walk 08/05/2019   Hives 07/08/2019   History of glaucoma 04/24/2019   Bursitis of left hip 12/25/2018   MGUS (monoclonal gammopathy of unknown significance) 05/11/2018   Rheumatoid arthritis involving multiple sites with positive rheumatoid factor (HCC) 05/03/2018   High risk medication use 05/03/2018   Status post total left knee replacement 05/03/2018   DDD (degenerative disc disease), lumbar 05/03/2018   Primary osteoarthritis of right knee 04/05/2018   Arthritis of left wrist 12/19/2017   Left rotator cuff tear 12/19/2017   Left wrist pain 12/12/2017   Left shoulder pain 08/22/2017   Acute pain of right knee 04/17/2017   Pain of left  calf 04/13/2017   S/P left TK revision 10/09/2016   Hyperglycemia 09/23/2015   Anxiety state 09/23/2015   Abnormal urine odor 08/25/2015   GERD without esophagitis 08/25/2015   Upper abdominal pain 04/16/2015   Early satiety 04/16/2015   Dysuria 04/16/2015   Encounter for well adult exam with abnormal findings 06/18/2014   Greater trochanteric bursitis of right hip 01/06/2013   Left lumbar radiculitis 04/03/2012   Eczema 04/03/2012   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   BURSITIS, RIGHT HIP 08/25/2009   INSOMNIA-SLEEP DISORDER-UNSPEC 08/25/2009   FATIGUE 08/25/2009   Cervicalgia 08/14/2007   Depression 03/15/2007   HLD (hyperlipidemia) 12/18/2006   Essential hypertension 12/18/2006   Allergic rhinitis 12/18/2006   RENAL CALCULUS, HX OF 12/18/2006    Past Medical History:  Diagnosis Date   ALLERGIC RHINITIS 12/18/2006   Qualifier: Diagnosis of  By: Genelle Gather CMA, [place]     Anxiety    Arthritis    DEPRESSION, SITUATIONAL 03/15/2007   Qualifier: Diagnosis of  By: Nena Jordan    GERD (gastroesophageal reflux disease)    Headache    occasional   History of kidney stones    HYPERLIPIDEMIA 12/18/2006   Qualifier: History of  By: Genelle Gather CMA, [place]     Hypertension    HYPERTENSION 12/18/2006   Qualifier: Diagnosis of  By: Genelle Gather CMA, [place]     INSOMNIA-SLEEP DISORDER-UNSPEC 08/25/2009   Qualifier: Diagnosis of  By: Jonny Ruiz MD, Len Blalock    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) 04/03/2012   Pneumonia    RENAL CALCULUS, HX OF 12/18/2006   Qualifier: Diagnosis of  By:  Shoffner CMA, [place]      Family History  Problem Relation Age of Onset   Stroke Mother    Heart disease Father    Lung cancer Sister    Lung cancer Brother    Healthy Daughter    Healthy Daughter    Healthy Daughter    Colon cancer Neg Hx    Esophageal cancer Neg Hx    Pancreatic cancer Neg Hx    Stomach cancer Neg Hx    Past Surgical History:  Procedure Laterality Date   CERVICAL DISC SURGERY      x 3   CONVERSION TO TOTAL KNEE Left 10/09/2016   Procedure: Conversion left uni compartment arthroplasty to total knee arthroplasty;  Surgeon: Durene Romans, MD;  Location: [facility];  Service: Orthopedics;  Laterality: Left;  90 mins   KNEE ARTHROSCOPY     partial   total left knee arthoplasty     10/09/16 Dr. Charlann Boxer   Social History   Social History Narrative   Not on file   Immunization History  Administered Date(s) Administered   Fluad Quad(high Dose 65+) 04/16/2020, 12/24/2020, 12/19/2021   Influenza Whole 11/12/2012   Influenza,inj,Quad PF,6+ Mos 11/29/2016, 12/11/2017, 12/25/2018   Influenza-Unspecified 12/15/2013, 12/12/2015, 11/29/2016   PNEUMOCOCCAL CONJUGATE-20 06/15/2022   Td 11/03/2002   Tdap 09/23/2015   Zoster Recombinat (Shingrix) 03/22/2018, 08/20/2018   Zoster, Live 08/12/2012     Objective: Vital Signs: There were no vitals taken for this visit.   Physical Exam   Musculoskeletal Exam: ***  CDAI Exam: CDAI Score: -- Patient Global: --; Provider Global: -- Swollen: --; Tender: -- Joint Exam 07/25/2022   No joint exam has been documented for this visit   There is currently no information documented on the homunculus. Go to the Rheumatology activity and complete the homunculus joint exam.  Investigation: No additional findings.  Imaging: No results found.  Recent Labs: Lab Results  Component Value Date   WBC 5.1 06/15/2022   HGB 10.8 (L) 06/15/2022   PLT 170.0 06/15/2022   NA 141 06/15/2022   K 3.5 06/15/2022   CL 106 06/15/2022   CO2 28 06/15/2022   GLUCOSE 102 (H) 06/15/2022   BUN 17 06/15/2022   CREATININE 0.94 06/15/2022   BILITOT 0.4 06/15/2022   ALKPHOS 55 06/15/2022   AST 15 06/15/2022   ALT 20 06/15/2022   PROT 6.9 06/15/2022   ALBUMIN 4.1 06/15/2022   CALCIUM 9.6 06/15/2022   GFRAA 74 05/14/2020   QFTBGOLDPLUS Negative 11/10/2021    Speciality Comments: PLQ Eye Exam: 03/26/2019 WNL @ [facility] PA FOllow up in 1  year  Procedures:  No procedures performed Allergies: Cymbalta [duloxetine hcl]   Assessment / Plan:     Visit Diagnoses: Rheumatoid arthritis involving multiple sites with positive rheumatoid factor (HCC)  High risk medication use  Primary osteoarthritis of right knee  S/P left TK revision  Trochanteric bursitis, right hip  Traumatic incomplete tear of left rotator cuff, subsequent encounter  DDD (degenerative disc disease), lumbar  Essential hypertension  History of hyperlipidemia  History of glaucoma  History of gastroesophageal reflux (GERD)  Vitamin D deficiency  Other proteinuria  Anxiety and depression  Orders: No orders of the defined types were placed in this encounter.  No orders of the defined types were placed in this encounter.   Face-to-face time spent with patient was *** minutes. Greater than 50% of time was spent in counseling and coordination of care.  Follow-Up Instructions: No  follow-ups on file.   Ofilia Neas, PA-C  Note - This record has been created using Dragon software.  Chart creation errors have been sought, but may not always  have been located. Such creation errors do not reflect on  the standard of medical care.

## 2022-07-14 ENCOUNTER — Other Ambulatory Visit: Payer: Self-pay | Admitting: *Deleted

## 2022-07-14 DIAGNOSIS — M0579 Rheumatoid arthritis with rheumatoid factor of multiple sites without organ or systems involvement: Secondary | ICD-10-CM

## 2022-07-14 MED ORDER — HUMIRA (2 PEN) 40 MG/0.4ML ~~LOC~~ AJKT: 40.0000 mg | AUTO-INJECTOR | SUBCUTANEOUS | 0 refills | Status: DC

## 2022-07-14 NOTE — Telephone Encounter (Signed)
Refill request received via fax from [facility] for Humira.  Last Fill: 05/08/2022  Labs: 06/15/2022 RBC 3.50, Hgb 10.8, Hct 31.6, Monocytes Relative 13.0, Eosinophils Relative 5.2, Glucose 102  TB Gold: 11/10/2021 Neg    Next Visit: 07/25/2022  Last Visit: 02/22/2022  NW:[id number] arthritis involving multiple sites with positive rheumatoid factor   Current Dose per office note 02/22/2022: Humira 40 mg sq injections every 14 days   Okay to refill Humira?

## 2022-07-17 NOTE — Progress Notes (Unsigned)
Office Visit Note  Patient: Pamela [name]             Date of Birth: 03-29-1955           MRN: [id number]             PCP: Corwin Levins, MD Referring: Corwin Levins, MD Visit Date: 07/18/2022 Occupation: @GUAROCC @  Subjective:  Pain in both legs  History of Present Illness: Pamela [name] is a 67 y.o. female with history of seropositive rheumatoid arthritis, osteoarthritis and degenerative disc disease.  She states she has been experiencing pain and discomfort in her bilateral knee joints in her bilateral legs.  Her left knee joint is replaced.  She states her feet has been hurting as well.  She continues to have some lower back pain.  Patient states she was given a prednisone taper by Dr. Laural Benes about a month ago for lower back pain.  She states she ran out of hydrocodone and tramadol.  She is on Humira 40 mg subcu every other week and methotrexate 4 tablets weekly with folic acid.  She denies interruption in therapy.    Activities of Daily Living:  Patient reports morning stiffness for 8 hours.   Patient Reports nocturnal pain.  Difficulty dressing/grooming: Denies Difficulty climbing stairs: Denies Difficulty getting out of chair: Denies Difficulty using hands for taps, buttons, cutlery, and/or writing: Denies  Review of Systems  Constitutional:  Positive for fatigue.  HENT:  Negative for mouth sores and mouth dryness.   Eyes:  Negative for dryness.  Respiratory:  Negative for shortness of breath.   Cardiovascular:  Positive for swelling in legs/feet. Negative for chest pain and palpitations.  Gastrointestinal:  Negative for blood in stool, constipation and diarrhea.  Endocrine: Negative for increased urination.  Genitourinary:  Negative for involuntary urination.  Musculoskeletal:  Positive for joint pain, joint pain, myalgias, muscle weakness, morning stiffness and myalgias. Negative for gait problem, joint swelling and muscle tenderness.  Skin:  Positive for sensitivity  to sunlight. Negative for color change, rash and hair loss.  Allergic/Immunologic: Negative for susceptible to infections.  Neurological:  Negative for dizziness and headaches.  Hematological:  Negative for swollen glands.  Psychiatric/Behavioral:  Positive for depressed mood and sleep disturbance. The patient is nervous/anxious.     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   Vitamin D deficiency 04/03/2021   Cough 03/29/2021   Acute upper respiratory infection 02/15/2021   Snoring 02/15/2021   Spinal stenosis of lumbar region 08/05/2019   Inability to walk 08/05/2019   Hives 07/08/2019   History of glaucoma 04/24/2019   Bursitis of left hip 12/25/2018   MGUS (monoclonal gammopathy of unknown significance) 05/11/2018   Rheumatoid arthritis involving multiple sites with positive rheumatoid factor (HCC) 05/03/2018   High risk medication use 05/03/2018   Status post total left knee replacement 05/03/2018   DDD (degenerative disc disease), lumbar 05/03/2018   Primary osteoarthritis of right knee 04/05/2018   Arthritis of left wrist 12/19/2017   Left rotator cuff tear 12/19/2017   Left wrist pain 12/12/2017   Left shoulder pain 08/22/2017   Acute pain of right knee 04/17/2017   Pain of left calf 04/13/2017   S/P left TK revision 10/09/2016   Hyperglycemia 09/23/2015   Anxiety state 09/23/2015   Abnormal urine odor 08/25/2015  GERD without esophagitis 08/25/2015   Upper abdominal pain 04/16/2015   Early satiety 04/16/2015   Dysuria 04/16/2015   Encounter for well adult exam with abnormal findings 06/18/2014   Greater trochanteric bursitis of right hip 01/06/2013   Left lumbar radiculitis 04/03/2012   Eczema 04/03/2012   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   BURSITIS, RIGHT HIP 08/25/2009   INSOMNIA-SLEEP DISORDER-UNSPEC  08/25/2009   FATIGUE 08/25/2009   Cervicalgia 08/14/2007   Depression 03/15/2007   HLD (hyperlipidemia) 12/18/2006   Essential hypertension 12/18/2006   Allergic rhinitis 12/18/2006   RENAL CALCULUS, HX OF 12/18/2006    Past Medical History:  Diagnosis Date   ALLERGIC RHINITIS 12/18/2006   Qualifier: Diagnosis of  By: Genelle Gather CMA, [place]     Anxiety    Arthritis    DEPRESSION, SITUATIONAL 03/15/2007   Qualifier: Diagnosis of  By: Nena Jordan    GERD (gastroesophageal reflux disease)    Headache    occasional   History of kidney stones    HYPERLIPIDEMIA 12/18/2006   Qualifier: History of  By: Genelle Gather CMA, [place]     Hypertension    HYPERTENSION 12/18/2006   Qualifier: Diagnosis of  By: Genelle Gather CMA, [place]     INSOMNIA-SLEEP DISORDER-UNSPEC 08/25/2009   Qualifier: Diagnosis of  By: Jonny Ruiz MD, Len Blalock    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) 04/03/2012   Pneumonia    RENAL CALCULUS, HX OF 12/18/2006   Qualifier: Diagnosis of  By: Genelle Gather CMA, [place]      Family History  Problem Relation Age of Onset   Stroke Mother    Heart disease Father    Lung cancer Sister    Lung cancer Brother    Healthy Daughter    Healthy Daughter    Healthy Daughter    Colon cancer Neg Hx    Esophageal cancer Neg Hx    Pancreatic cancer Neg Hx    Stomach cancer Neg Hx    Past Surgical History:  Procedure Laterality Date   CERVICAL DISC SURGERY     x 3   CONVERSION TO TOTAL KNEE Left 10/09/2016   Procedure: Conversion left uni compartment arthroplasty to total knee arthroplasty;  Surgeon: Durene Romans, MD;  Location: [facility];  Service: Orthopedics;  Laterality: Left;  90 mins   KNEE ARTHROSCOPY     partial   total left knee arthoplasty     10/09/16 Dr. Charlann Boxer   Social History   Social History Narrative   Not on file   Immunization History  Administered Date(s) Administered   Fluad Quad(high Dose 65+) 04/16/2020, 12/24/2020, 12/19/2021   Influenza Whole 11/12/2012    Influenza,inj,Quad PF,6+ Mos 11/29/2016, 12/11/2017, 12/25/2018   Influenza-Unspecified 12/15/2013, 12/12/2015, 11/29/2016   PNEUMOCOCCAL CONJUGATE-20 06/15/2022   Td 11/03/2002   Tdap 09/23/2015   Zoster Recombinat (Shingrix) 03/22/2018, 08/20/2018   Zoster, Live 08/12/2012     Objective: Vital Signs: BP 102/70 (BP Location: Left Arm, Patient Position: Sitting, Cuff Size: Normal)   Pulse 92   Resp 16   Ht 5\' 9"  (1.753 m)   Wt 245 lb (111.1 kg)   BMI 36.18 kg/m    Physical Exam Vitals and nursing note reviewed.  Constitutional:      Appearance: She is well-developed.  HENT:     Head: Normocephalic and atraumatic.  Eyes:     Conjunctiva/sclera: Conjunctivae normal.  Cardiovascular:     Rate and Rhythm: Normal rate and regular rhythm.     Heart sounds: Normal  heart sounds.  Pulmonary:     Effort: Pulmonary effort is normal.     Breath sounds: Normal breath sounds.  Abdominal:     General: Bowel sounds are normal.     Palpations: Abdomen is soft.  Musculoskeletal:     Cervical back: Normal range of motion.  Lymphadenopathy:     Cervical: No cervical adenopathy.  Skin:    General: Skin is warm and dry.     Capillary Refill: Capillary refill takes less than 2 seconds.  Neurological:     Mental Status: She is alert and oriented to person, place, and time.  Psychiatric:        Behavior: Behavior normal.      Musculoskeletal Exam: Cervical spine was in good range of motion.  Shoulder joints, elbow joints, wrist joints, MCPs PIPs and DIPs were in good range of motion.  She had bilateral MCP thickening but no synovitis was noted.  Hip joints were in good range of motion.  Left knee joint is replaced.  She had discomfort with range of motion of the left knee joint.  She had discomfort range of motion of her right knee joint with limited extension.  No warmth swelling or effusion was noted.  She had bilateral lower extremity pitting edema.  There was no tenderness over ankles  or MTPs.  CDAI Exam: CDAI Score: 3.1  Patient Global: 8 mm; Provider Global: 3 mm Swollen: 0 ; Tender: 2  Joint Exam 07/18/2022      Right  Left  Knee   Tender   Tender     Investigation: No additional findings.  Imaging: No results found.  Recent Labs: Lab Results  Component Value Date   WBC 5.1 06/15/2022   HGB 10.8 (L) 06/15/2022   PLT 170.0 06/15/2022   NA 141 06/15/2022   K 3.5 06/15/2022   CL 106 06/15/2022   CO2 28 06/15/2022   GLUCOSE 102 (H) 06/15/2022   BUN 17 06/15/2022   CREATININE 0.94 06/15/2022   BILITOT 0.4 06/15/2022   ALKPHOS 55 06/15/2022   AST 15 06/15/2022   ALT 20 06/15/2022   PROT 6.9 06/15/2022   ALBUMIN 4.1 06/15/2022   CALCIUM 9.6 06/15/2022   GFRAA 74 05/14/2020   QFTBGOLDPLUS Negative 11/10/2021    Speciality Comments: PLQ Eye Exam: 03/26/2019 WNL @ [facility] PA FOllow up in 1 year  Procedures:  Large Joint Inj: R knee on 07/18/2022 2:19 PM Indications: pain Details: 27 G 1.5 in needle, medial approach  Arthrogram: No  Medications: 40 mg triamcinolone acetonide 40 MG/ML; 1.5 mL lidocaine 1 % Aspirate: 0 mL Outcome: tolerated well, no immediate complications Procedure, treatment alternatives, risks and benefits explained, specific risks discussed. Consent was given by the patient. Immediately prior to procedure a time out was called to verify the correct patient, procedure, equipment, support staff and site/side marked as required. Patient was prepped and draped in the usual sterile fashion.     Allergies: Cymbalta [duloxetine hcl]   Assessment / Plan:     Visit Diagnoses: Rheumatoid arthritis involving multiple sites with positive rheumatoid factor (HCC) - ANA 1:160 Cytoplasmic, RF 116, sed rate 22: Patient reports having a rheumatoid arthritis flare with increased joint pain and discomfort.  No synovitis was noted.  She continues to have pain and discomfort in her knee joint due to severe osteoarthritis.  She also has  lower back pain due to degenerative disc disease.  She has been taking Humira and methotrexate on a  regular basis.  Patient states that she stopped taking folic acid and then restarted taking folic acid.  High risk medication use - Humira 40 mg sq injections every 14 days-started 12/01/19, Methotrexate 4 tablets by mouth once weekly, and folic acid 1 mg po daily. -Labs obtained on June 15, 2022 CBC was normal except hemoglobin of 10.8.  CMP was normal.  TB Gold was negative on November 10, 2021.  Plan: QuantiFERON-TB Gold Plus with next labs.  Primary osteoarthritis of right knee -has been having increased pain and discomfort in her right knee joint.  No warmth swelling or effusion was noted.  X-rays obtained in March 2023 were consistent with severe osteoarthritis.  Per patient's request right knee joint was prepped in sterile fashion injected with lidocaine and Kenalog as described above.  Patient tolerated the procedure well.  Postprocedure instructions were given.  S/P left TK revision-she continues to have discomfort in her left knee joint.  Trochanteric bursitis, right hip-she had mild tenderness.  She has intermittent discomfort in the trochanteric region.  Traumatic incomplete tear of left rotator cuff, subsequent encounter-she had good range of motion without discomfort today.  DDD (degenerative disc disease), lumbar -she continues to have severe pain and discomfort in her lower back.  MRI of the lumbar spine is pending.  She also had a prednisone taper by Dr. Jonny Ruiz about a month ago.  Mild multilevel degenerative disc disease with disc space narrowing at L3-L4, facet hypertrophy in the lower lumbar spine, and levoconvex rotatory scoliosis.  History of hyperlipidemia-she is on Cozaar 100 mg p.o. every other day.  Increased risk of heart disease with the rheumatoid arthritis was discussed.  Need for regular exercise was emphasized.  Essential hypertension-blood pressure was normal  History of  gastroesophageal reflux (GERD)  Vitamin D deficiency-vitamin D was low at 22.14 on June 15, 2022.  Patient reports that she started taking vitamin D 2000 units daily after the labs..  History of glaucoma  Anxiety and depression  Other proteinuria  Orders: Orders Placed This Encounter  Procedures   Large Joint Inj: R knee   QuantiFERON-TB Gold Plus   No orders of the defined types were placed in this encounter.    Follow-Up Instructions: Return in about 5 months (around 12/18/2022) for Rheumatoid arthritis, Osteoarthritis.   Pollyann Savoy, MD  Note - This record has been created using Animal nutritionist.  Chart creation errors have been sought, but may not always  have been located. Such creation errors do not reflect on  the standard of medical care.

## 2022-07-18 ENCOUNTER — Ambulatory Visit: Payer: Medicare HMO | Attending: Physician Assistant | Admitting: Rheumatology

## 2022-07-18 ENCOUNTER — Encounter: Payer: Self-pay | Admitting: Rheumatology

## 2022-07-18 VITALS — BP 102/70 | HR 92 | Resp 16 | Ht 69.0 in | Wt 245.0 lb

## 2022-07-18 DIAGNOSIS — F419 Anxiety disorder, unspecified: Secondary | ICD-10-CM

## 2022-07-18 DIAGNOSIS — M7061 Trochanteric bursitis, right hip: Secondary | ICD-10-CM

## 2022-07-18 DIAGNOSIS — Z96652 Presence of left artificial knee joint: Secondary | ICD-10-CM | POA: Diagnosis not present

## 2022-07-18 DIAGNOSIS — Z8719 Personal history of other diseases of the digestive system: Secondary | ICD-10-CM

## 2022-07-18 DIAGNOSIS — M1711 Unilateral primary osteoarthritis, right knee: Secondary | ICD-10-CM | POA: Diagnosis not present

## 2022-07-18 DIAGNOSIS — I10 Essential (primary) hypertension: Secondary | ICD-10-CM | POA: Diagnosis not present

## 2022-07-18 DIAGNOSIS — Z8669 Personal history of other diseases of the nervous system and sense organs: Secondary | ICD-10-CM

## 2022-07-18 DIAGNOSIS — Z79899 Other long term (current) drug therapy: Secondary | ICD-10-CM | POA: Diagnosis not present

## 2022-07-18 DIAGNOSIS — M0579 Rheumatoid arthritis with rheumatoid factor of multiple sites without organ or systems involvement: Secondary | ICD-10-CM | POA: Diagnosis not present

## 2022-07-18 DIAGNOSIS — M5136 Other intervertebral disc degeneration, lumbar region: Secondary | ICD-10-CM

## 2022-07-18 DIAGNOSIS — Z8639 Personal history of other endocrine, nutritional and metabolic disease: Secondary | ICD-10-CM

## 2022-07-18 DIAGNOSIS — E559 Vitamin D deficiency, unspecified: Secondary | ICD-10-CM

## 2022-07-18 DIAGNOSIS — R808 Other proteinuria: Secondary | ICD-10-CM

## 2022-07-18 DIAGNOSIS — S46012D Strain of muscle(s) and tendon(s) of the rotator cuff of left shoulder, subsequent encounter: Secondary | ICD-10-CM | POA: Diagnosis not present

## 2022-07-18 DIAGNOSIS — F32A Depression, unspecified: Secondary | ICD-10-CM

## 2022-07-18 MED ORDER — LIDOCAINE HCL 1 % IJ SOLN
1.5000 mL | INTRAMUSCULAR | Status: AC | PRN
Start: 2022-07-18 — End: 2022-07-18
  Administered 2022-07-18: 1.5 mL

## 2022-07-18 MED ORDER — TRIAMCINOLONE ACETONIDE 40 MG/ML IJ SUSP
40.0000 mg | INTRAMUSCULAR | Status: AC | PRN
Start: 2022-07-18 — End: 2022-07-18
  Administered 2022-07-18: 40 mg via INTRA_ARTICULAR

## 2022-07-18 NOTE — Patient Instructions (Addendum)
Standing Labs We placed an order today for your standing lab work.   Please have your standing labs drawn in July and every 3 months  TB Gold with next labs  Please have your labs drawn 2 weeks prior to your appointment so that the provider can discuss your lab results at your appointment, if possible.  Please note that you may see your imaging and lab results in MyChart before we have reviewed them. We will contact you once all results are reviewed. Please allow our office up to 72 hours to thoroughly review all of the results before contacting the office for clarification of your results.  WALK-IN LAB HOURS  Monday through Thursday from 8:00 am -12:30 pm and 1:00 pm-5:00 pm and Friday from 8:00 am-12:00 pm.  Patients with office visits requiring labs will be seen before walk-in labs.  You may encounter longer than normal wait times. Please allow additional time. Wait times may be shorter on  Monday and Thursday afternoons.  We do not book appointments for walk-in labs. We appreciate your patience and understanding with our staff.   Labs are drawn by [facility]. Please bring your co-pay at the time of your lab draw.  You may receive a bill from [facility] for your lab work.  Please note if you are on Hydroxychloroquine and and an order has been placed for a Hydroxychloroquine level,  you will need to have it drawn 4 hours or more after your last dose.  If you wish to have your labs drawn at another location, please call the office 24 hours in advance so we can fax the orders.  The office is located at [address], [address], [place], [place] [address]   If you have any questions regarding directions or hours of operation,  please call [phone].   As a reminder, please drink plenty of water prior to coming for your lab work. Thanks!   Vaccines You are taking a medication(s) that can suppress your immune system.  The following immunizations are recommended: Flu annually Covid-19   Td/Tdap (tetanus, diphtheria, pertussis) every 10 years Pneumonia (Prevnar 15 then Pneumovax 23 at least 1 year apart.  Alternatively, can take Prevnar 20 without needing additional dose) Shingrix: 2 doses from 4 weeks to 6 months apart  Please check with your PCP to make sure you are up to date.   If you have signs or symptoms of an infection or start antibiotics: First, call your PCP for workup of your infection. Hold your medication through the infection, until you complete your antibiotics, and until symptoms resolve if you take the following: Injectable medication (Actemra, Benlysta, Cimzia, Cosentyx, Enbrel, Humira, Kevzara, Orencia, Remicade, Simponi, Stelara, Taltz, Tremfya) Methotrexate Leflunomide (Arava) Mycophenolate (Cellcept) Harriette Ohara, Olumiant, or Rinvoq    He is get an annual skin examination to screen for skin cancer while you are on Humira.  Please use sunscreen and sun protection on a regular basis.

## 2022-07-25 ENCOUNTER — Ambulatory Visit: Payer: Medicare HMO | Admitting: Physician Assistant

## 2022-07-25 DIAGNOSIS — I10 Essential (primary) hypertension: Secondary | ICD-10-CM

## 2022-07-25 DIAGNOSIS — Z8639 Personal history of other endocrine, nutritional and metabolic disease: Secondary | ICD-10-CM

## 2022-07-25 DIAGNOSIS — M5136 Other intervertebral disc degeneration, lumbar region: Secondary | ICD-10-CM

## 2022-07-25 DIAGNOSIS — M1711 Unilateral primary osteoarthritis, right knee: Secondary | ICD-10-CM

## 2022-07-25 DIAGNOSIS — F32A Depression, unspecified: Secondary | ICD-10-CM

## 2022-07-25 DIAGNOSIS — Z96652 Presence of left artificial knee joint: Secondary | ICD-10-CM

## 2022-07-25 DIAGNOSIS — M0579 Rheumatoid arthritis with rheumatoid factor of multiple sites without organ or systems involvement: Secondary | ICD-10-CM

## 2022-07-25 DIAGNOSIS — E559 Vitamin D deficiency, unspecified: Secondary | ICD-10-CM

## 2022-07-25 DIAGNOSIS — Z8719 Personal history of other diseases of the digestive system: Secondary | ICD-10-CM

## 2022-07-25 DIAGNOSIS — Z79899 Other long term (current) drug therapy: Secondary | ICD-10-CM

## 2022-07-25 DIAGNOSIS — M7061 Trochanteric bursitis, right hip: Secondary | ICD-10-CM

## 2022-07-25 DIAGNOSIS — Z8669 Personal history of other diseases of the nervous system and sense organs: Secondary | ICD-10-CM

## 2022-07-25 DIAGNOSIS — R808 Other proteinuria: Secondary | ICD-10-CM

## 2022-07-25 DIAGNOSIS — S46012D Strain of muscle(s) and tendon(s) of the rotator cuff of left shoulder, subsequent encounter: Secondary | ICD-10-CM

## 2022-08-03 ENCOUNTER — Other Ambulatory Visit: Payer: Self-pay | Admitting: Internal Medicine

## 2022-08-15 ENCOUNTER — Telehealth: Payer: Self-pay | Admitting: Internal Medicine

## 2022-08-15 ENCOUNTER — Ambulatory Visit (INDEPENDENT_AMBULATORY_CARE_PROVIDER_SITE_OTHER): Payer: Medicare HMO

## 2022-08-15 VITALS — Ht 69.0 in | Wt 245.0 lb

## 2022-08-15 DIAGNOSIS — Z0000 Encounter for general adult medical examination without abnormal findings: Secondary | ICD-10-CM

## 2022-08-15 MED ORDER — HYDROCODONE-ACETAMINOPHEN 10-325 MG PO TABS: 1.0000 | ORAL_TABLET | Freq: Four times a day (QID) | ORAL | 0 refills | Status: DC | PRN

## 2022-08-15 NOTE — Patient Instructions (Addendum)
Ms. Pichette , Thank you for taking time to come for your Medicare Wellness Visit. I appreciate your ongoing commitment to your health goals. Please review the following plan we discussed and let me know if I can assist you in the future.   These are the goals we discussed:  Goals       No current goals (pt-stated)        This is a list of the screening recommended for you and due dates:  Health Maintenance  Topic Date Due   Flu Shot  10/12/2022   Mammogram  02/03/2023   Medicare Annual Wellness Visit  08/15/2023   DTaP/Tdap/Td vaccine (3 - Td or Tdap) 09/22/2025   Colon Cancer Screening  11/07/2025   Pneumonia Vaccine  Completed   DEXA scan (bone density measurement)  Completed   Hepatitis C Screening  Completed   Zoster (Shingles) Vaccine  Completed   HPV Vaccine  Aged Out   COVID-19 Vaccine  Discontinued   Opioid Pain Medicine Management Opioids are powerful medicines that are used to treat moderate to severe pain. When used for short periods of time, they can help you to: Sleep better. Do better in physical or occupational therapy. Feel better in the first few days after an injury. Recover from surgery. Opioids should be taken with the supervision of a trained health care provider. They should be taken for the shortest period of time possible. This is because opioids can be addictive, and the longer you take opioids, the greater your risk of addiction. This addiction can also be called opioid use disorder. What are the risks? Using opioid pain medicines for longer than 3 days increases your risk of side effects. Side effects include: Constipation. Nausea and vomiting. Breathing difficulties (respiratory depression). Drowsiness. Confusion. Opioid use disorder. Itching. Taking opioid pain medicine for a long period of time can affect your ability to do daily tasks. It also puts you at risk for: Motor vehicle crashes. Depression. Suicide. Heart attack. Overdose, which can  be life-threatening. What is a pain treatment plan? A pain treatment plan is an agreement between you and your health care provider. Pain is unique to each person, and treatments vary depending on your condition. To manage your pain, you and your health care provider need to work together. To help you do this: Discuss the goals of your treatment, including how much pain you might expect to have and how you will manage the pain. Review the risks and benefits of taking opioid medicines. Remember that a good treatment plan uses more than one approach and minimizes the chance of side effects. Be honest about the amount of medicines you take and about any drug or alcohol use. Get pain medicine prescriptions from only one health care provider. Pain can be managed with many types of alternative treatments. Ask your health care provider to refer you to one or more specialists who can help you manage pain through: Physical or occupational therapy. Counseling (cognitive behavioral therapy). Good nutrition. Biofeedback. Massage. Meditation. Non-opioid medicine. Following a gentle exercise program. How to use opioid pain medicine Taking medicine Take your pain medicine exactly as told by your health care provider. Take it only when you need it. If your pain gets less severe, you may take less than your prescribed dose if your health care provider approves. If you are not having pain, do nottake pain medicine unless your health care provider tells you to take it. If your pain is severe, do nottry to treat  it yourself by taking more pills than instructed on your prescription. Contact your health care provider for help. Write down the times when you take your pain medicine. It is easy to become confused while on pain medicine. Writing the time can help you avoid overdose. Take other over-the-counter or prescription medicines only as told by your health care provider. Keeping yourself and others  safe  While you are taking opioid pain medicine: Do not drive, use machinery, or power tools. Do not sign legal documents. Do not drink alcohol. Do not take sleeping pills. Do not supervise children by yourself. Do not do activities that require climbing or being in high places. Do not go to a lake, river, ocean, spa, or swimming pool. Do not share your pain medicine with anyone. Keep pain medicine in a locked cabinet or in a secure area where pets and children cannot reach it. Stopping your use of opioids If you have been taking opioid medicine for more than a few weeks, you may need to slowly decrease (taper) how much you take until you stop completely. Tapering your use of opioids can decrease your risk of symptoms of withdrawal, such as: Pain and cramping in the abdomen. Nausea. Sweating. Sleepiness. Restlessness. Uncontrollable shaking (tremors). Cravings for the medicine. Do not attempt to taper your use of opioids on your own. Talk with your health care provider about how to do this. Your health care provider may prescribe a step-down schedule based on how much medicine you are taking and how long you have been taking it. Getting rid of leftover pills Do not save any leftover pills. Get rid of leftover pills safely by: Taking the medicine to a prescription take-back program. This is usually offered by the county or law enforcement. Bringing them to a pharmacy that has a drug disposal container. Flushing them down the toilet. Check the label or package insert of your medicine to see whether this is safe to do. Throwing them out in the trash. Check the label or package insert of your medicine to see whether this is safe to do. If it is safe to throw it out, remove the medicine from the original container, put it into a sealable bag or container, and mix it with used coffee grounds, food scraps, dirt, or cat litter before putting it in the trash. Follow these instructions at  home: Activity Do exercises as told by your health care provider. Avoid activities that make your pain worse. Return to your normal activities as told by your health care provider. Ask your health care provider what activities are safe for you. General instructions You may need to take these actions to prevent or treat constipation: Drink enough fluid to keep your urine pale yellow. Take over-the-counter or prescription medicines. Eat foods that are high in fiber, such as beans, whole grains, and fresh fruits and vegetables. Limit foods that are high in fat and processed sugars, such as fried or sweet foods. Keep all follow-up visits. This is important. Where to find support If you have been taking opioids for a long time, you may benefit from receiving support for quitting from a local support group or counselor. Ask your health care provider for a referral to these resources in your area. Where to find more information Centers for Disease Control and Prevention (CDC): FootballExhibition.com.br [place] Food and Drug Administration (FDA): PumpkinSearch.com.ee Get help right away if: You may have taken too much of an opioid (overdosed). Common symptoms of an overdose: Your breathing is  slower or more shallow than normal. You have a very slow heartbeat (pulse). You have slurred speech. You have nausea and vomiting. Your pupils become very small. You have other potential symptoms: You are very confused. You faint or feel like you will faint. You have cold, clammy skin. You have blue lips or fingernails. You have thoughts of harming yourself or harming others. These symptoms may represent a serious problem that is an emergency. Do not wait to see if the symptoms will go away. Get medical help right away. Call your local emergency services (911 in the [place] Do not drive yourself to the hospital.  If you ever feel like you may hurt yourself or others, or have thoughts about taking your own life, get help right away.  Go to your nearest emergency department or: Call your local emergency services (911 in the [place] Call the [facility] ((845) 813-0622 in the [place] Call a suicide crisis helpline, such as the National Suicide Prevention Lifeline at 4178422467 or 988 in the [place] This is open 24 hours a day in the [place] Text the Crisis Text Line at 641 792 7316 (in the [place] Summary Opioid medicines can help you manage moderate to severe pain for a short period of time. A pain treatment plan is an agreement between you and your health care provider. Discuss the goals of your treatment, including how much pain you might expect to have and how you will manage the pain. If you think that you or someone else may have taken too much of an opioid, get medical help right away. This information is not intended to replace advice given to you by your health care provider. Make sure you discuss any questions you have with your health care provider. Document Revised: 09/22/2020 Document Reviewed: 06/09/2020 Elsevier Patient Education  2024 [employer]  Advanced directives: Advance directive discussed with you today. Even though you declined this today, please call our office should you change your mind, and we can give you the proper paperwork for you to fill out.   Conditions/risks identified: None  Next appointment: Follow up in one year for your annual wellness visit     Preventive Care 65 Years and Older, Female Preventive care refers to lifestyle choices and visits with your health care provider that can promote health and wellness. What does preventive care include? A yearly physical exam. This is also called an annual well check. Dental exams once or twice a year. Routine eye exams. Ask your health care provider how often you should have your eyes checked. Personal lifestyle choices, including: Daily care of your teeth and gums. Regular physical activity. Eating a healthy diet. Avoiding  tobacco and drug use. Limiting alcohol use. Practicing safe sex. Taking low-dose aspirin every day. Taking vitamin and mineral supplements as recommended by your health care provider. What happens during an annual well check? The services and screenings done by your health care provider during your annual well check will depend on your age, overall health, lifestyle risk factors, and family history of disease. Counseling  Your health care provider may ask you questions about your: Alcohol use. Tobacco use. Drug use. Emotional well-being. Home and relationship well-being. Sexual activity. Eating habits. History of falls. Memory and ability to understand (cognition). Work and work Astronomer. Reproductive health. Screening  You may have the following tests or measurements: Height, weight, and BMI. Blood pressure. Lipid and cholesterol levels. These may be checked every 5 years, or more frequently if you are over 50  years old. Skin check. Lung cancer screening. You may have this screening every year starting at age 44 if you have a 30-pack-year history of smoking and currently smoke or have quit within the past 15 years. Fecal occult blood test (FOBT) of the stool. You may have this test every year starting at age 58. Flexible sigmoidoscopy or colonoscopy. You may have a sigmoidoscopy every 5 years or a colonoscopy every 10 years starting at age 74. Hepatitis C blood test. Hepatitis B blood test. Sexually transmitted disease (STD) testing. Diabetes screening. This is done by checking your blood sugar (glucose) after you have not eaten for a while (fasting). You may have this done every 1-3 years. Bone density scan. This is done to screen for osteoporosis. You may have this done starting at age 57. Mammogram. This may be done every 1-2 years. Talk to your health care provider about how often you should have regular mammograms. Talk with your health care provider about your test  results, treatment options, and if necessary, the need for more tests. Vaccines  Your health care provider may recommend certain vaccines, such as: Influenza vaccine. This is recommended every year. Tetanus, diphtheria, and acellular pertussis (Tdap, Td) vaccine. You may need a Td booster every 10 years. Zoster vaccine. You may need this after age 63. Pneumococcal 13-valent conjugate (PCV13) vaccine. One dose is recommended after age 43. Pneumococcal polysaccharide (PPSV23) vaccine. One dose is recommended after age 69. Talk to your health care provider about which screenings and vaccines you need and how often you need them. This information is not intended to replace advice given to you by your health care provider. Make sure you discuss any questions you have with your health care provider. Document Released: 03/26/2015 Document Revised: 11/17/2015 Document Reviewed: 12/29/2014 Elsevier Interactive Patient Education  2017 [employer].  Fall Prevention in the Home Falls can cause injuries. They can happen to people of all ages. There are many things you can do to make your home safe and to help prevent falls. What can I do on the outside of my home? Regularly fix the edges of walkways and driveways and fix any cracks. Remove anything that might make you trip as you walk through a door, such as a raised step or threshold. Trim any bushes or trees on the path to your home. Use bright outdoor lighting. Clear any walking paths of anything that might make someone trip, such as rocks or tools. Regularly check to see if handrails are loose or broken. Make sure that both sides of any steps have handrails. Any raised decks and porches should have guardrails on the edges. Have any leaves, snow, or ice cleared regularly. Use sand or salt on walking paths during winter. Clean up any spills in your garage right away. This includes oil or grease spills. What can I do in the bathroom? Use night  lights. Install grab bars by the toilet and in the tub and shower. Do not use towel bars as grab bars. Use non-skid mats or decals in the tub or shower. If you need to sit down in the shower, use a plastic, non-slip stool. Keep the floor dry. Clean up any water that spills on the floor as soon as it happens. Remove soap buildup in the tub or shower regularly. Attach bath mats securely with double-sided non-slip rug tape. Do not have throw rugs and other things on the floor that can make you trip. What can I do in the bedroom? Use  night lights. Make sure that you have a light by your bed that is easy to reach. Do not use any sheets or blankets that are too big for your bed. They should not hang down onto the floor. Have a firm chair that has side arms. You can use this for support while you get dressed. Do not have throw rugs and other things on the floor that can make you trip. What can I do in the kitchen? Clean up any spills right away. Avoid walking on wet floors. Keep items that you use a lot in easy-to-reach places. If you need to reach something above you, use a strong step stool that has a grab bar. Keep electrical cords out of the way. Do not use floor polish or wax that makes floors slippery. If you must use wax, use non-skid floor wax. Do not have throw rugs and other things on the floor that can make you trip. What can I do with my stairs? Do not leave any items on the stairs. Make sure that there are handrails on both sides of the stairs and use them. Fix handrails that are broken or loose. Make sure that handrails are as long as the stairways. Check any carpeting to make sure that it is firmly attached to the stairs. Fix any carpet that is loose or worn. Avoid having throw rugs at the top or bottom of the stairs. If you do have throw rugs, attach them to the floor with carpet tape. Make sure that you have a light switch at the top of the stairs and the bottom of the stairs. If  you do not have them, ask someone to add them for you. What else can I do to help prevent falls? Wear shoes that: Do not have high heels. Have rubber bottoms. Are comfortable and fit you well. Are closed at the toe. Do not wear sandals. If you use a stepladder: Make sure that it is fully opened. Do not climb a closed stepladder. Make sure that both sides of the stepladder are locked into place. Ask someone to hold it for you, if possible. Clearly mark and make sure that you can see: Any grab bars or handrails. First and last steps. Where the edge of each step is. Use tools that help you move around (mobility aids) if they are needed. These include: Canes. Walkers. Scooters. Crutches. Turn on the lights when you go into a dark area. Replace any light bulbs as soon as they burn out. Set up your furniture so you have a clear path. Avoid moving your furniture around. If any of your floors are uneven, fix them. If there are any pets around you, be aware of where they are. Review your medicines with your doctor. Some medicines can make you feel dizzy. This can increase your chance of falling. Ask your doctor what other things that you can do to help prevent falls. This information is not intended to replace advice given to you by your health care provider. Make sure you discuss any questions you have with your health care provider. Document Released: 12/24/2008 Document Revised: 08/05/2015 Document Reviewed: 04/03/2014 Elsevier Interactive Patient Education  2017 [employer].

## 2022-08-15 NOTE — Addendum Note (Signed)
Addended by: Corwin Levins on: 08/15/2022 02:53 PM   Modules accepted: Orders

## 2022-08-15 NOTE — Progress Notes (Addendum)
Subjective:   Pamela [name] is a 67 y.o. female who presents for Medicare Annual (Subsequent) preventive examination.  Review of Systems   Virtual Visit via Telephone Note  I connected with  Pamela [name] on 08/15/22 at  9:15 AM EDT by telephone and verified that I am speaking with the correct person using two identifiers.  Location: Patient: Home Provider: Office Persons participating in the virtual visit: patient/Nurse Health Advisor   I discussed the limitations, risks, security and privacy concerns of performing an evaluation and management service by telephone and the availability of in person appointments. The patient expressed understanding and agreed to proceed.  Interactive audio and video telecommunications were attempted between this nurse and patient, however failed, due to patient having technical difficulties OR patient did not have access to video capability.  We continued and completed visit with audio only.  Some vital signs may be absent or patient reported.   Tillie Rung, LPN  Cardiac Risk Factors include: advanced age (>70men, >26 women);hypertension     Objective:    Today's Vitals   08/15/22 0941  Weight: 245 lb (111.1 kg)  Height: 5\' 9"  (1.753 m)   Body mass index is 36.18 kg/m.     08/15/2022    9:48 AM 12/16/2019    2:41 PM 08/04/2019    7:45 PM 10/14/2017    1:41 PM 10/09/2016    5:45 PM 10/09/2016   10:09 AM 10/03/2016    9:53 AM  Advanced Directives  Does Patient Have a Medical Advance Directive? No No No No No No No  Does patient want to make changes to medical advance directive?  No - Patient declined       Would patient like information on creating a medical advance directive? No - Patient declined No - Patient declined   No - Patient declined No - Patient declined Yes (MAU/Ambulatory/Procedural Areas - Information given)    Current Medications (verified) Outpatient Encounter Medications as of 08/15/2022  Medication Sig   Adalimumab  (HUMIRA, 2 PEN,) 40 MG/0.4ML PNKT Inject 40 mg into the skin every 14 (fourteen) days.   ALPRAZolam (XANAX) 0.25 MG tablet TAKE 1 TABLET BY MOUTH TWICE A DAY AS NEEDED   atorvastatin (LIPITOR) 20 MG tablet TAKE 1 TABLET BY MOUTH EVERY DAY   citalopram (CELEXA) 40 MG tablet TAKE 1 TABLET BY MOUTH EVERY DAY   cyclobenzaprine (FLEXERIL) 5 MG tablet TAKE 1 TABLET BY MOUTH THREE TIMES A DAY AS NEEDED FOR MUSCLE SPASM   gabapentin (NEURONTIN) 400 MG capsule TAKE 1 CAPSULE BY MOUTH 3 TIMES A DAY   guaiFENesin-dextromethorphan (ROBITUSSIN DM) 100-10 MG/5ML syrup Take 5 mLs by mouth every 4 (four) hours as needed for cough.   hydrochlorothiazide (MICROZIDE) 12.5 MG capsule TAKE 1 CAPSULE(12.5 MG) BY MOUTH DAILY   losartan (COZAAR) 100 MG tablet TAKE 1 TABLET BY MOUTH EVERY DAY   methotrexate (RHEUMATREX) 2.5 MG tablet TAKE 4 TABLETS (10 MG TOTAL) BY MOUTH ONCE A WEEK   metoCLOPramide (REGLAN) 10 MG tablet TAKE 1 TABLET BY MOUTH IN THE MORNING AND IN THE EVENING   Multiple Vitamin (MULTIVITAMIN WITH MINERALS) TABS tablet Take 1 tablet by mouth daily.   pantoprazole (PROTONIX) 40 MG tablet Take 1 tablet (40 mg total) by mouth 2 (two) times daily.   traMADol (ULTRAM) 50 MG tablet TAKE 1 TABLET BY MOUTH EVERY 6 HOURS AS NEEDED. FOR PAIN (Patient not taking: Reported on 07/18/2022)   traZODone (DESYREL) 50 MG tablet TAKE 1/2  TO 1 TABLET BY MOUTH AT BEDTIME AS NEEDED FOR SLEEP   zolpidem (AMBIEN) 10 MG tablet TAKE 1 TABLET BY MOUTH EVERY DAY AT BEDTIME AS NEEDED   [DISCONTINUED] HYDROcodone-acetaminophen (NORCO) 10-325 MG tablet Take 1 tablet by mouth every 6 (six) hours as needed. (Patient not taking: Reported on 07/18/2022)   No facility-administered encounter medications on file as of 08/15/2022.    Allergies (verified) Cymbalta [duloxetine hcl]   History: Past Medical History:  Diagnosis Date   ALLERGIC RHINITIS 12/18/2006   Qualifier: Diagnosis of  By: Genelle Gather CMA, [place]     Anxiety    Arthritis     DEPRESSION, SITUATIONAL 03/15/2007   Qualifier: Diagnosis of  By: Nena Jordan    GERD (gastroesophageal reflux disease)    Headache    occasional   History of kidney stones    HYPERLIPIDEMIA 12/18/2006   Qualifier: History of  By: Genelle Gather CMA, [place]     Hypertension    HYPERTENSION 12/18/2006   Qualifier: Diagnosis of  By: Genelle Gather CMA, [place]     INSOMNIA-SLEEP DISORDER-UNSPEC 08/25/2009   Qualifier: Diagnosis of  By: Jonny Ruiz MD, Len Blalock    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) 04/03/2012   Pneumonia    RENAL CALCULUS, HX OF 12/18/2006   Qualifier: Diagnosis of  By: Genelle Gather CMA, [place]     Past Surgical History:  Procedure Laterality Date   CERVICAL DISC SURGERY     x 3   CONVERSION TO TOTAL KNEE Left 10/09/2016   Procedure: Conversion left uni compartment arthroplasty to total knee arthroplasty;  Surgeon: Durene Romans, MD;  Location: [facility];  Service: Orthopedics;  Laterality: Left;  90 mins   KNEE ARTHROSCOPY     partial   total left knee arthoplasty     10/09/16 Dr. Charlann Boxer   Family History  Problem Relation Age of Onset   Stroke Mother    Heart disease Father    Lung cancer Sister    Lung cancer Brother    Healthy Daughter    Healthy Daughter    Healthy Daughter    Colon cancer Neg Hx    Esophageal cancer Neg Hx    Pancreatic cancer Neg Hx    Stomach cancer Neg Hx    Social History   Socioeconomic History   Marital status: Married    Spouse name: Not on file   Number of children: 3   Years of education: Not on file   Highest education level: Not on file  Occupational History   Not on file  Tobacco Use   Smoking status: Former    Packs/day: 0.25    Years: 3.00    Additional pack years: 0.00    Total pack years: 0.75    Types: Cigarettes    Quit date: 1970    Years since quitting: 54.4    Passive exposure: Never   Smokeless tobacco: Never   Tobacco comments:    1970's  Vaping Use   Vaping Use: Never used  Substance and Sexual  Activity   Alcohol use: Yes    Comment: occ   Drug use: No   Sexual activity: Yes  Other Topics Concern   Not on file  Social History Narrative   Not on file   Social Determinants of Health   Financial Resource Strain: Low Risk  (08/15/2022)   Overall Financial Resource Strain (CARDIA)    Difficulty of Paying Living Expenses: Not hard at all  Food Insecurity:  No Food Insecurity (08/15/2022)   Hunger Vital Sign    Worried About Running Out of Food in the Last Year: Never true    Ran Out of Food in the Last Year: Never true  Transportation Needs: No Transportation Needs (08/15/2022)   PRAPARE - Administrator, Civil Service (Medical): No    Lack of Transportation (Non-Medical): No  Physical Activity: Inactive (08/15/2022)   Exercise Vital Sign    Days of Exercise per Week: 0 days    Minutes of Exercise per Session: 0 min  Stress: No Stress Concern Present (08/15/2022)   [employer] of Occupational Health - Occupational Stress Questionnaire    Feeling of Stress : Not at all  Social Connections: Socially Integrated (08/15/2022)   Social Connection and Isolation Panel [NHANES]    Frequency of Communication with Friends and Family: More than three times a week    Frequency of Social Gatherings with Friends and Family: More than three times a week    Attends Religious Services: More than 4 times per year    Active Member of [employer] or Organizations: Yes    Attends Engineer, structural: More than 4 times per year    Marital Status: Married    Tobacco Counseling Counseling given: Not Answered Tobacco comments: 1970's   Clinical Intake:  Pre-visit preparation completed: No  Pain : No/denies pain     BMI - recorded: 36.18 Nutritional Status: BMI > 30  Obese Nutritional Risks: None Diabetes: No  How often do you need to have someone help you when you read instructions, pamphlets, or other written materials from your doctor or pharmacy?: 1 - Never  Diabetic?   No  Interpreter Needed?: No  Information entered by :: Theresa Mulligan LPN   Activities of Daily Living    08/15/2022    9:47 AM  In your present state of health, do you have any difficulty performing the following activities:  Hearing? 0  Vision? 0  Difficulty concentrating or making decisions? 0  Walking or climbing stairs? 0  Dressing or bathing? 0  Doing errands, shopping? 0  Preparing Food and eating ? N  Using the Toilet? N  In the past six months, have you accidently leaked urine? N  Do you have problems with loss of bowel control? N  Managing your Medications? N  Managing your Finances? N  Housekeeping or managing your Housekeeping? N    Patient Care Team: Corwin Levins, MD as PCP - General (Internal Medicine)  Indicate any recent Medical Services you may have received from other than [facility] providers in the past year (date may be approximate).     Assessment:   This is a routine wellness examination for Pamela [name].  Hearing/Vision screen Hearing Screening - Comments:: Denies hearing difficulties   Vision Screening - Comments:: Wears rx glasses - up to date with routine eye exams with  Dr Harriette Bouillon  Dietary issues and exercise activities discussed: Exercise limited by: None identified   Goals Addressed               This Visit's Progress     No current goals (pt-stated)         Depression Screen    08/17/2022    8:09 AM 06/15/2022   10:31 AM 03/31/2022    2:23 PM 03/23/2022    4:41 PM 12/19/2021   10:01 AM 08/31/2021   10:32 AM 08/10/2021    1:46 PM  PHQ 2/9 Scores  PHQ -  2 Score 0 0 0 0 3 3 2   PHQ- 9 Score    0 5 10 2     Fall Risk    08/15/2022    9:47 AM 06/15/2022   10:30 AM 03/31/2022    2:22 PM 03/23/2022    4:41 PM 12/19/2021   10:02 AM  Fall Risk   Falls in the past year? 0 0 1 1 1   Number falls in past yr: 0 0 0 1 1  Injury with Fall? 0 0 1 0 1  Comment   right leg bruising    Risk for fall due to : No Fall Risks  Other (Comment);Impaired  balance/gait History of fall(s) History of fall(s);Other (Comment)  Follow up Falls prevention discussed  Falls evaluation completed Falls evaluation completed Falls evaluation completed    FALL RISK PREVENTION PERTAINING TO THE HOME:  Any stairs in or around the home? Yes  If so, are there any without handrails? No  Home free of loose throw rugs in walkways, pet beds, electrical cords, etc? Yes  Adequate lighting in your home to reduce risk of falls? Yes   ASSISTIVE DEVICES UTILIZED TO PREVENT FALLS:  Life alert? No  Use of a cane, walker or w/c? No  Grab bars in the bathroom? No  Shower chair or bench in shower? No  Elevated toilet seat or a handicapped toilet? No   TIMED UP AND GO:  Was the test performed? No . Audio Visit    Cognitive Function:        08/15/2022    9:48 AM 08/10/2021    1:49 PM  6CIT Screen  What Year? 0 points 0 points  What month? 0 points 0 points  What time? 0 points 3 points  Count back from 20 0 points 0 points  Months in reverse 4 points 4 points  Repeat phrase 6 points 2 points  Total Score 10 points 9 points    Immunizations Immunization History  Administered Date(s) Administered   Fluad Quad(high Dose 65+) 04/16/2020, 12/24/2020, 12/19/2021   Influenza Whole 11/12/2012   Influenza,inj,Quad PF,6+ Mos 11/29/2016, 12/11/2017, 12/25/2018   Influenza-Unspecified 12/15/2013, 12/12/2015, 11/29/2016   PNEUMOCOCCAL CONJUGATE-20 06/15/2022   Td 11/03/2002   Tdap 09/23/2015   Zoster Recombinat (Shingrix) 03/22/2018, 08/20/2018   Zoster, Live 08/12/2012    TDAP status: Up to date  Flu Vaccine status: Up to date  Pneumococcal vaccine status: Up to date    Qualifies for Shingles Vaccine? Yes   Zostavax completed Yes   Shingrix Completed?: Yes  Screening Tests Health Maintenance  Topic Date Due   INFLUENZA VACCINE  10/12/2022   MAMMOGRAM  02/03/2023   Medicare Annual Wellness (AWV)  08/15/2023   DTaP/Tdap/Td (3 - Td or Tdap)  09/22/2025   Colonoscopy  11/07/2025   Pneumonia Vaccine 50+ Years old  Completed   DEXA SCAN  Completed   Hepatitis C Screening  Completed   Zoster Vaccines- Shingrix  Completed   HPV VACCINES  Aged Out   COVID-19 Vaccine  Discontinued    Health Maintenance  There are no preventive care reminders to display for this patient.   Colorectal cancer screening: Type of screening: Colonoscopy. Completed 11/08/15. Repeat every 10 years  Mammogram status: Completed 02/02/21. Repeat every year  Bone Density status: Completed 01/20/20. Results reflect: Bone density results: OSTEOPOROSIS. Repeat every   years.  Lung Cancer Screening: (Low Dose CT Chest recommended if Age 63-80 years, 30 pack-year currently smoking OR have quit w/in 15years.) does  not qualify.   Lung Cancer Screening Referral:   Additional Screening:  Hepatitis C Screening: does qualify; Completed 04/26/18  Vision Screening: Recommended annual ophthalmology exams for early detection of glaucoma and other disorders of the eye. Is the patient up to date with their annual eye exam?  Yes  Who is the provider or what is the name of the office in which the patient attends annual eye exams?  Dr Harriette Bouillon If pt is not established with a provider, would they like to be referred to a provider to establish care? No .   Dental Screening: Recommended annual dental exams for proper oral hygiene  Community Resource Referral / Chronic Care Management:  CRR required this visit?  No   CCM required this visit?  No      Plan:     I have personally reviewed and noted the following in the patient's chart:   Medical and social history Use of alcohol, tobacco or illicit drugs  Current medications and supplements including opioid prescriptions. Patient is currently taking opioid prescriptions. Information provided to patient regarding non-opioid alternatives. Patient advised to discuss non-opioid treatment plan with their  provider. Functional ability and status Nutritional status Physical activity Advanced directives List of other physicians Hospitalizations, surgeries, and ER visits in previous 12 months Vitals Screenings to include cognitive, depression, and falls Referrals and appointments  In addition, I have reviewed and discussed with patient certain preventive protocols, quality metrics, and best practice recommendations. A written personalized care plan for preventive services as well as general preventive health recommendations were provided to patient.     Tillie Rung, LPN  03/18/08   Nurse Notes: Depression Screening was completed during this visit on 08/15/22 with score of 0.

## 2022-08-15 NOTE — Telephone Encounter (Signed)
Prescription Request  08/15/2022  LOV: 06/15/2022  What is the name of the medication or equipment? HYDROcodone-acetaminophen (NORCO) 10-325 MG tablet   Have you contacted your pharmacy to request a refill? No   Which pharmacy would you like this sent to?  [id number] [place], [place] - [address] [place] [address] Phone: [phone] Fax: [phone]    Patient notified that their request is being sent to the clinical staff for review and that they should receive a response within 2 business days.   Please advise at Mobile [phone] (mobile)

## 2022-08-15 NOTE — Telephone Encounter (Signed)
Done erx 

## 2022-08-30 ENCOUNTER — Ambulatory Visit: Payer: Medicare HMO | Admitting: Internal Medicine

## 2022-08-31 ENCOUNTER — Other Ambulatory Visit: Payer: Self-pay | Admitting: Physician Assistant

## 2022-08-31 NOTE — Telephone Encounter (Signed)
Last Fill: 07/03/2022  Labs: 06/15/2022 RBC 3.50 Hemoglobin 10.8 HCT 31.6 Monocytes Relative 13.0 Eosinophils Relative 5.2 Glucose 102  Next Visit: 12/26/2022  Last Visit: 07/18/2022  DX: Rheumatoid arthritis involving multiple sites with positive rheumatoid factor   Current Dose per office note 07/18/2022: Methotrexate 4 tablets by mouth once weekly   Okay to refill Methotrexate?

## 2022-09-05 ENCOUNTER — Other Ambulatory Visit: Payer: Self-pay | Admitting: Internal Medicine

## 2022-09-05 ENCOUNTER — Other Ambulatory Visit: Payer: Self-pay | Admitting: Physician Assistant

## 2022-09-11 ENCOUNTER — Other Ambulatory Visit: Payer: Self-pay | Admitting: Internal Medicine

## 2022-09-12 NOTE — Progress Notes (Deleted)
Office Visit Note  Patient: Pamela [name]             Date of Birth: 1955/12/19           MRN: [id number]             PCP: Corwin Levins, MD Referring: Corwin Levins, MD Visit Date: 09/26/2022 Occupation: @GUAROCC @  Subjective:  No chief complaint on file.   History of Present Illness: Pamela [name] is a 68 y.o. female ***     Activities of Daily Living:  Patient reports morning stiffness for *** {minute/hour:19697}.   Patient {ACTIONS;DENIES/REPORTS:21021675::"Denies"} nocturnal pain.  Difficulty dressing/grooming: {ACTIONS;DENIES/REPORTS:21021675::"Denies"} Difficulty climbing stairs: {ACTIONS;DENIES/REPORTS:21021675::"Denies"} Difficulty getting out of chair: {ACTIONS;DENIES/REPORTS:21021675::"Denies"} Difficulty using hands for taps, buttons, cutlery, and/or writing: {ACTIONS;DENIES/REPORTS:21021675::"Denies"}  No Rheumatology ROS completed.   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   Vitamin D deficiency 04/03/2021   Cough 03/29/2021   Acute upper respiratory infection 02/15/2021   Snoring 02/15/2021   Spinal stenosis of lumbar region 08/05/2019   Inability to walk 08/05/2019   Hives 07/08/2019   History of glaucoma 04/24/2019   Bursitis of left hip 12/25/2018   MGUS (monoclonal gammopathy of unknown significance) 05/11/2018   Rheumatoid arthritis involving multiple sites with positive rheumatoid factor (HCC) 05/03/2018   High risk medication use 05/03/2018   Status post total left knee replacement 05/03/2018   DDD (degenerative disc disease), lumbar 05/03/2018   Primary osteoarthritis of right knee 04/05/2018   Arthritis of left wrist 12/19/2017   Left rotator cuff tear 12/19/2017   Left wrist pain 12/12/2017   Left shoulder pain 08/22/2017   Acute pain of right knee 04/17/2017   Pain of left  calf 04/13/2017   S/P left TK revision 10/09/2016   Hyperglycemia 09/23/2015   Anxiety state 09/23/2015   Abnormal urine odor 08/25/2015   GERD without esophagitis 08/25/2015   Upper abdominal pain 04/16/2015   Early satiety 04/16/2015   Dysuria 04/16/2015   Encounter for well adult exam with abnormal findings 06/18/2014   Greater trochanteric bursitis of right hip 01/06/2013   Left lumbar radiculitis 04/03/2012   Eczema 04/03/2012   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   BURSITIS, RIGHT HIP 08/25/2009   INSOMNIA-SLEEP DISORDER-UNSPEC 08/25/2009   FATIGUE 08/25/2009   Cervicalgia 08/14/2007   Depression 03/15/2007   HLD (hyperlipidemia) 12/18/2006   Essential hypertension 12/18/2006   Allergic rhinitis 12/18/2006   RENAL CALCULUS, HX OF 12/18/2006    Past Medical History:  Diagnosis Date   ALLERGIC RHINITIS 12/18/2006   Qualifier: Diagnosis of  By: Genelle Gather CMA, [place]     Anxiety    Arthritis    DEPRESSION, SITUATIONAL 03/15/2007   Qualifier: Diagnosis of  By: Nena Jordan    GERD (gastroesophageal reflux disease)    Headache    occasional   History of kidney stones    HYPERLIPIDEMIA 12/18/2006   Qualifier: History of  By: Genelle Gather CMA, [place]     Hypertension    HYPERTENSION 12/18/2006   Qualifier: Diagnosis of  By: Genelle Gather CMA, [place]     INSOMNIA-SLEEP DISORDER-UNSPEC 08/25/2009   Qualifier: Diagnosis of  By: Jonny Ruiz MD, Len Blalock    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) 04/03/2012   Pneumonia    RENAL CALCULUS, HX OF 12/18/2006   Qualifier: Diagnosis of  By:  Shoffner CMA, [place]      Family History  Problem Relation Age of Onset   Stroke Mother    Heart disease Father    Lung cancer Sister    Lung cancer Brother    Healthy Daughter    Healthy Daughter    Healthy Daughter    Colon cancer Neg Hx    Esophageal cancer Neg Hx    Pancreatic cancer Neg Hx    Stomach cancer Neg Hx    Past Surgical History:  Procedure Laterality Date   CERVICAL DISC SURGERY      x 3   CONVERSION TO TOTAL KNEE Left 10/09/2016   Procedure: Conversion left uni compartment arthroplasty to total knee arthroplasty;  Surgeon: Durene Romans, MD;  Location: [facility];  Service: Orthopedics;  Laterality: Left;  90 mins   KNEE ARTHROSCOPY     partial   total left knee arthoplasty     10/09/16 Dr. Charlann Boxer   Social History   Social History Narrative   Not on file   Immunization History  Administered Date(s) Administered   Fluad Quad(high Dose 65+) 04/16/2020, 12/24/2020, 12/19/2021   Influenza Whole 11/12/2012   Influenza,inj,Quad PF,6+ Mos 11/29/2016, 12/11/2017, 12/25/2018   Influenza-Unspecified 12/15/2013, 12/12/2015, 11/29/2016   PNEUMOCOCCAL CONJUGATE-20 06/15/2022   Td 11/03/2002   Tdap 09/23/2015   Zoster Recombinant(Shingrix) 03/22/2018, 08/20/2018   Zoster, Live 08/12/2012     Objective: Vital Signs: There were no vitals taken for this visit.   Physical Exam   Musculoskeletal Exam: ***  CDAI Exam: CDAI Score: -- Patient Global: --; Provider Global: -- Swollen: --; Tender: -- Joint Exam 09/26/2022   No joint exam has been documented for this visit   There is currently no information documented on the homunculus. Go to the Rheumatology activity and complete the homunculus joint exam.  Investigation: No additional findings.  Imaging: No results found.  Recent Labs: Lab Results  Component Value Date   WBC 5.1 06/15/2022   HGB 10.8 (L) 06/15/2022   PLT 170.0 06/15/2022   NA 141 06/15/2022   K 3.5 06/15/2022   CL 106 06/15/2022   CO2 28 06/15/2022   GLUCOSE 102 (H) 06/15/2022   BUN 17 06/15/2022   CREATININE 0.94 06/15/2022   BILITOT 0.4 06/15/2022   ALKPHOS 55 06/15/2022   AST 15 06/15/2022   ALT 20 06/15/2022   PROT 6.9 06/15/2022   ALBUMIN 4.1 06/15/2022   CALCIUM 9.6 06/15/2022   GFRAA 74 05/14/2020   QFTBGOLDPLUS Negative 11/10/2021    Speciality Comments: PLQ Eye Exam: 03/26/2019 WNL @ [facility] [place] OD PA FOllow up in 1  year  Procedures:  No procedures performed Allergies: Cymbalta [duloxetine hcl]   Assessment / Plan:     Visit Diagnoses: No diagnosis found.  Orders: No orders of the defined types were placed in this encounter.  No orders of the defined types were placed in this encounter.   Face-to-face time spent with patient was *** minutes. Greater than 50% of time was spent in counseling and coordination of care.  Follow-Up Instructions: No follow-ups on file.   Ellen Henri, CMA  Note - This record has been created using Animal nutritionist.  Chart creation errors have been sought, but may not always  have been located. Such creation errors do not reflect on  the standard of medical care.

## 2022-09-22 ENCOUNTER — Telehealth: Payer: Self-pay | Admitting: Internal Medicine

## 2022-09-22 MED ORDER — HYDROCODONE-ACETAMINOPHEN 10-325 MG PO TABS: 1.0000 | ORAL_TABLET | Freq: Four times a day (QID) | ORAL | 0 refills | Status: DC | PRN

## 2022-09-22 NOTE — Telephone Encounter (Signed)
Prescription Request  09/22/2022  LOV: 06/15/2022  What is the name of the medication or equipment? HYDROcodone-acetaminophen (NORCO) 10-325 MG tablet   Have you contacted your pharmacy to request a refill? Yes   Which pharmacy would you like this sent to?   [id number] [place], [place] - [address] [place] [address] Phone: [phone] Fax: [phone]  Patient notified that their request is being sent to the clinical staff for review and that they should receive a response within 2 business days.   Please advise at Mobile [phone] (mobile)

## 2022-09-22 NOTE — Telephone Encounter (Signed)
Done erx 

## 2022-09-23 ENCOUNTER — Other Ambulatory Visit: Payer: Self-pay | Admitting: Internal Medicine

## 2022-09-25 ENCOUNTER — Other Ambulatory Visit: Payer: Self-pay | Admitting: Internal Medicine

## 2022-09-26 ENCOUNTER — Ambulatory Visit: Payer: Medicare HMO | Admitting: Rheumatology

## 2022-09-26 DIAGNOSIS — M5136 Other intervertebral disc degeneration, lumbar region: Secondary | ICD-10-CM

## 2022-09-26 DIAGNOSIS — Z8639 Personal history of other endocrine, nutritional and metabolic disease: Secondary | ICD-10-CM

## 2022-09-26 DIAGNOSIS — M0579 Rheumatoid arthritis with rheumatoid factor of multiple sites without organ or systems involvement: Secondary | ICD-10-CM

## 2022-09-26 DIAGNOSIS — R808 Other proteinuria: Secondary | ICD-10-CM

## 2022-09-26 DIAGNOSIS — S46012D Strain of muscle(s) and tendon(s) of the rotator cuff of left shoulder, subsequent encounter: Secondary | ICD-10-CM

## 2022-09-26 DIAGNOSIS — I10 Essential (primary) hypertension: Secondary | ICD-10-CM

## 2022-09-26 DIAGNOSIS — Z96652 Presence of left artificial knee joint: Secondary | ICD-10-CM

## 2022-09-26 DIAGNOSIS — Z8669 Personal history of other diseases of the nervous system and sense organs: Secondary | ICD-10-CM

## 2022-09-26 DIAGNOSIS — E559 Vitamin D deficiency, unspecified: Secondary | ICD-10-CM

## 2022-09-26 DIAGNOSIS — M7061 Trochanteric bursitis, right hip: Secondary | ICD-10-CM

## 2022-09-26 DIAGNOSIS — Z8719 Personal history of other diseases of the digestive system: Secondary | ICD-10-CM

## 2022-09-26 DIAGNOSIS — F419 Anxiety disorder, unspecified: Secondary | ICD-10-CM

## 2022-09-26 DIAGNOSIS — Z79899 Other long term (current) drug therapy: Secondary | ICD-10-CM

## 2022-09-26 DIAGNOSIS — M1711 Unilateral primary osteoarthritis, right knee: Secondary | ICD-10-CM

## 2022-10-01 ENCOUNTER — Other Ambulatory Visit: Payer: Self-pay | Admitting: Physician Assistant

## 2022-10-09 ENCOUNTER — Other Ambulatory Visit: Payer: Self-pay | Admitting: *Deleted

## 2022-10-09 DIAGNOSIS — M0579 Rheumatoid arthritis with rheumatoid factor of multiple sites without organ or systems involvement: Secondary | ICD-10-CM

## 2022-10-09 MED ORDER — HUMIRA (2 PEN) 40 MG/0.4ML ~~LOC~~ AJKT
40.0000 mg | AUTO-INJECTOR | SUBCUTANEOUS | 0 refills | Status: DC
Start: 2022-10-09 — End: 2022-12-28

## 2022-10-09 NOTE — Telephone Encounter (Signed)
Refill request received via fax from [facility] for Humira   Last Fill: 07/14/2022  Labs: 06/15/2022 Hgb 10.8, Hct 31.6, Monocytes Relative 13.0, Eosinophils Relative 5.2, Glucose 102  TB Gold: 11/10/2021 Neg    Next Visit: 12/26/2022  Last Visit: 07/18/2022  DX: Rheumatoid arthritis involving multiple sites with positive rheumatoid factor   Current Dose per office note 07/18/2022: Humira 40 mg sq injections every 14 days   Patient advised she is due to update her labs. Patient states she will come to the office to update her labs.   Okay to refill Humira?

## 2022-10-12 ENCOUNTER — Ambulatory Visit: Payer: Medicare HMO | Admitting: Internal Medicine

## 2022-10-24 ENCOUNTER — Other Ambulatory Visit: Payer: Self-pay | Admitting: Internal Medicine

## 2022-11-02 ENCOUNTER — Other Ambulatory Visit: Payer: Self-pay | Admitting: Physician Assistant

## 2022-11-06 ENCOUNTER — Telehealth: Payer: Self-pay | Admitting: Rheumatology

## 2022-11-06 NOTE — Telephone Encounter (Signed)
Please schedule office visit.

## 2022-11-06 NOTE — Telephone Encounter (Signed)
Called pt to schedule appt. NO VM

## 2022-11-06 NOTE — Telephone Encounter (Signed)
Pt called stating that the Humira is not working for her. Pt would like to know if there was anything that could help her knee and the back of her legs from pain and aches.

## 2022-11-07 NOTE — Progress Notes (Unsigned)
Office Visit Note  Patient: Pamela [name]             Date of Birth: 1955-05-17           MRN: [id number]             PCP: Corwin Levins, MD Referring: Corwin Levins, MD Visit Date: 11/08/2022 Occupation: @GUAROCC @  Subjective:  Right knee pain   History of Present Illness: Pamela [name] is a 67 y.o. female with history of rheumatoid arthritis and osteoarthritis.  Patient remains on Humira 40 mg sq injections every 14 days-started 12/01/19, Methotrexate 4 tablets by mouth once weekly, and folic acid 1 mg po daily.  She is tolerating both medications without any side effects and has not missed any doses recently.  Patient presents today with right knee joint pain.  She had a right knee joint cortisone injection on 07/18/2022 which provided significant relief but her symptoms have returned.  Patient is apprehensive to proceed with a knee replacement at this time.  She has requested a repeat cortisone injection today.  She denies any warmth or swelling in her right knee joint.  Patient states that her left knee replacement is doing well.  Patient denies any other signs or symptoms of a rheumatoid arthritis flare.  She denies any recurrent infections. Patient states that she did recently have 2 teeth extracted and has not yet followed up with her dentist.  She denies any fevers but has some pain in her gums.  Activities of Daily Living:  Patient reports morning stiffness for 15-20 minutes.   Patient Reports nocturnal pain.  Difficulty dressing/grooming: Denies Difficulty climbing stairs: Reports Difficulty getting out of chair: Denies Difficulty using hands for taps, buttons, cutlery, and/or writing: Denies  Review of Systems  Constitutional:  Negative for fatigue.  HENT:  Positive for mouth sores. Negative for mouth dryness.   Eyes:  Positive for dryness. Negative for pain, redness and itching.  Respiratory:  Negative for shortness of breath.   Cardiovascular:  Negative for chest pain and  palpitations.  Gastrointestinal:  Negative for blood in stool, constipation and diarrhea.  Endocrine: Negative for increased urination.  Genitourinary:  Negative for involuntary urination.  Musculoskeletal:  Positive for joint pain, gait problem, joint pain, myalgias, muscle weakness, morning stiffness and myalgias. Negative for joint swelling and muscle tenderness.  Skin:  Negative for color change, rash, hair loss and sensitivity to sunlight.  Allergic/Immunologic: Negative for susceptible to infections.  Neurological:  Negative for dizziness and headaches.  Hematological:  Negative for swollen glands.  Psychiatric/Behavioral:  Positive for sleep disturbance. Negative for depressed mood. The patient is nervous/anxious.     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   Vitamin D deficiency 04/03/2021   Cough 03/29/2021   Acute upper respiratory infection 02/15/2021   Snoring 02/15/2021   Spinal stenosis of lumbar region 08/05/2019   Inability to walk 08/05/2019   Hives 07/08/2019   History of glaucoma 04/24/2019   Bursitis of left hip 12/25/2018   MGUS (monoclonal gammopathy of unknown significance) 05/11/2018   Rheumatoid arthritis involving multiple sites with positive rheumatoid factor (HCC) 05/03/2018   High risk medication use 05/03/2018   Status post total left knee replacement 05/03/2018   DDD (degenerative disc disease), lumbar 05/03/2018   Primary osteoarthritis of right knee 04/05/2018  Arthritis of left wrist 12/19/2017   Left rotator cuff tear 12/19/2017   Left wrist pain 12/12/2017   Left shoulder pain 08/22/2017   Acute pain of right knee 04/17/2017   Pain of left calf 04/13/2017   S/P left TK revision 10/09/2016   Hyperglycemia 09/23/2015   Anxiety state 09/23/2015   Abnormal urine odor 08/25/2015    GERD without esophagitis 08/25/2015   Upper abdominal pain 04/16/2015   Early satiety 04/16/2015   Dysuria 04/16/2015   Encounter for well adult exam with abnormal findings 06/18/2014   Greater trochanteric bursitis of right hip 01/06/2013   Left lumbar radiculitis 04/03/2012   Eczema 04/03/2012   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   BURSITIS, RIGHT HIP 08/25/2009   INSOMNIA-SLEEP DISORDER-UNSPEC 08/25/2009   FATIGUE 08/25/2009   Cervicalgia 08/14/2007   Depression 03/15/2007   HLD (hyperlipidemia) 12/18/2006   Essential hypertension 12/18/2006   Allergic rhinitis 12/18/2006   RENAL CALCULUS, HX OF 12/18/2006    Past Medical History:  Diagnosis Date   ALLERGIC RHINITIS 12/18/2006   Qualifier: Diagnosis of  By: Genelle Gather CMA, [place]     Anxiety    Arthritis    DEPRESSION, SITUATIONAL 03/15/2007   Qualifier: Diagnosis of  By: Nena Jordan    GERD (gastroesophageal reflux disease)    Headache    occasional   History of kidney stones    HYPERLIPIDEMIA 12/18/2006   Qualifier: History of  By: Genelle Gather CMA, [place]     Hypertension    HYPERTENSION 12/18/2006   Qualifier: Diagnosis of  By: Genelle Gather CMA, [place]     INSOMNIA-SLEEP DISORDER-UNSPEC 08/25/2009   Qualifier: Diagnosis of  By: Jonny Ruiz MD, Len Blalock    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) 04/03/2012   Pneumonia    RENAL CALCULUS, HX OF 12/18/2006   Qualifier: Diagnosis of  By: Genelle Gather CMA, [place]      Family History  Problem Relation Age of Onset   Stroke Mother    Heart disease Father    Lung cancer Sister    Lung cancer Brother    Healthy Daughter    Healthy Daughter    Healthy Daughter    Colon cancer Neg Hx    Esophageal cancer Neg Hx    Pancreatic cancer Neg Hx    Stomach cancer Neg Hx    Past Surgical History:  Procedure Laterality Date   CERVICAL DISC SURGERY     x 3   CONVERSION TO TOTAL KNEE Left 10/09/2016   Procedure: Conversion left uni compartment arthroplasty to total knee arthroplasty;   Surgeon: Durene Romans, MD;  Location: [facility];  Service: Orthopedics;  Laterality: Left;  90 mins   KNEE ARTHROSCOPY     partial   TOOTH EXTRACTION  2024   x2   total left knee arthoplasty     10/09/16 Dr. Charlann Boxer   Social History   Social History Narrative   Not on file   Immunization History  Administered Date(s) Administered   Fluad Quad(high Dose 65+) 04/16/2020, 12/24/2020, 12/19/2021   Influenza Whole 11/12/2012   Influenza,inj,Quad PF,6+ Mos 11/29/2016, 12/11/2017, 12/25/2018   Influenza-Unspecified 12/15/2013, 12/12/2015, 11/29/2016   PNEUMOCOCCAL CONJUGATE-20 06/15/2022   Td 11/03/2002   Tdap 09/23/2015   Zoster Recombinant(Shingrix) 03/22/2018, 08/20/2018   Zoster, Live 08/12/2012     Objective: Vital Signs: BP 130/84 (BP Location: Left Arm, Patient Position: Sitting, Cuff Size: Large)   Pulse 90   Resp 15   Ht 5\' 9"  (1.753 m)  Wt 230 lb 6.4 oz (104.5 kg)   BMI 34.02 kg/m    Physical Exam Vitals and nursing note reviewed.  Constitutional:      Appearance: She is well-developed.  HENT:     Head: Normocephalic and atraumatic.  Eyes:     Conjunctiva/sclera: Conjunctivae normal.  Cardiovascular:     Rate and Rhythm: Normal rate and regular rhythm.     Heart sounds: Normal heart sounds.  Pulmonary:     Effort: Pulmonary effort is normal.     Breath sounds: Normal breath sounds.  Abdominal:     General: Bowel sounds are normal.     Palpations: Abdomen is soft.  Musculoskeletal:     Cervical back: Normal range of motion.  Lymphadenopathy:     Cervical: No cervical adenopathy.  Skin:    General: Skin is warm and dry.     Capillary Refill: Capillary refill takes less than 2 seconds.  Neurological:     Mental Status: She is alert and oriented to person, place, and time.  Psychiatric:        Behavior: Behavior normal.      Musculoskeletal Exam: C-spine, thoracic spine, lumbar spine good range of motion.  Shoulder joints, elbow joints, wrist joints, MCPs,  PIPs, DIPs have good range of motion with no synovitis.  Synovial thickening over MCP joints but no active synovitis.  Complete fist formation bilaterally.  Hip joints have good range of motion with no groin pain.  Painful range of motion of the right knee but no warmth or effusion noted.  Left knee replacement has good range of motion.  Ankle joints have good range of motion with no tenderness or synovitis.  CDAI Exam: CDAI Score: 7  Patient Global: 30 / 100; Provider Global: 30 / 100 Swollen: 0 ; Tender: 1  Joint Exam 11/08/2022      Right  Left  Knee   Tender        Investigation: No additional findings.  Imaging: No results found.  Recent Labs: Lab Results  Component Value Date   WBC 5.1 06/15/2022   HGB 10.8 (L) 06/15/2022   PLT 170.0 06/15/2022   NA 141 06/15/2022   K 3.5 06/15/2022   CL 106 06/15/2022   CO2 28 06/15/2022   GLUCOSE 102 (H) 06/15/2022   BUN 17 06/15/2022   CREATININE 0.94 06/15/2022   BILITOT 0.4 06/15/2022   ALKPHOS 55 06/15/2022   AST 15 06/15/2022   ALT 20 06/15/2022   PROT 6.9 06/15/2022   ALBUMIN 4.1 06/15/2022   CALCIUM 9.6 06/15/2022   GFRAA 74 05/14/2020   QFTBGOLDPLUS Negative 11/10/2021    Speciality Comments: PLQ Eye Exam: 03/26/2019 WNL @ [facility] M McFarland OD PA FOllow up in 1 year  Procedures:  Large Joint Inj: R knee on 11/08/2022 1:26 PM Indications: pain Details: 27 G 1.5 in needle, medial approach  Arthrogram: No  Medications: 1.5 mL lidocaine 1 %; 40 mg triamcinolone acetonide 40 MG/ML Aspirate: 0 mL Outcome: tolerated well, no immediate complications Procedure, treatment alternatives, risks and benefits explained, specific risks discussed. Consent was given by the patient. Immediately prior to procedure a time out was called to verify the correct patient, procedure, equipment, support staff and site/side marked as required. Patient was prepped and draped in the usual sterile fashion.     Allergies: Cymbalta [duloxetine  hcl]     Assessment / Plan:     Visit Diagnoses: Rheumatoid arthritis involving multiple sites with positive rheumatoid factor (HCC) -  ANA 1:160 Cytoplasmic, RF 116, sed rate 22: She has no synovitis on examination today.  She has not had any signs or symptoms of a rheumatoid arthritis flare.  She has clinically been doing well on Humira 40 mg sq injections every 14 days along with methotrexate 4 tablets by mouth once weekly.  She is tolerating combination therapy without any side effects and has not missed any doses recently. She presents today with a recurrence of right knee joint pain.  On examination she has discomfort range of motion but no warmth or effusion.  X-rays of the right knee were updated today and the right knee joint was injected with cortisone after informed consent was provided.  Discussed applying for viscosupplementation for the right knee since she is not ready to proceed with a knee replacement at this time.  She was in agreement. She will remain on Humira and methotrexate as prescribed.  She was advised to notify us if she develops signs or symptoms of a flare. She will follow up in 3 months or sooner if needed.   High risk medication use - Humira 40 mg sq injections every 14 days-started 12/01/19, Methotrexate 4 tablets by mouth once weekly, and folic acid 1 mg po daily.  CBC, hepatic function panel, and BMP updated on 06/15/22. Orders for CBC and CMP released today.  TB gold negative on 11/10/21. Order for TB gold released today.  Discussed the importance of holding humira and methotrexate if she develops signs or symptoms of an infection and to resume once the infection has completely cleared.  - Plan: CBC with Differential/Platelet, COMPLETE METABOLIC PANEL WITH GFR, QuantiFERON-TB Gold Plus  Screening for tuberculosis -Order for TB gold released today. Plan: QuantiFERON-TB Gold Plus  Chronic pain of right knee - Patient presents today with a recurrence of right knee joint  pain.  At times she has buckling in her right knee but currently does not have any instability.  She has not noticed any warmth or joint swelling.  She currently rates the pain in her knee a 5 out of 10.  Patient had a significant improvement in her knee joint pain after having a cortisone injection performed on 07/18/2022.  Her symptoms have recurred so she has requested a repeat cortisone injection today.  She tolerated the procedure well.  Procedure note was completed above.  Aftercare was discussed.  Discussed proceeding with viscosupplementation for the right knee due to the chronicity of pain.  She is not ready to proceed with a knee replacement at this time.  X-rays of the right knee were updated today.  Plan: XR KNEE 3 VIEW RIGHT, Large Joint Inj: R knee  Primary osteoarthritis of right knee - X-rays of the right knee were updated today.  Plan to apply for viscosupplementation for the right knee.  plan: Large Joint Inj: R knee  This patient is diagnosed with osteoarthritis of the right knee   Radiographs show evidence of joint space narrowing, osteophytes, subchondral sclerosis and/or subchondral cysts.  This patient has knee pain which interferes with functional and activities of daily living.    This patient has experienced inadequate response, adverse effects and/or intolerance with conservative treatments such as acetaminophen, NSAIDS, topical creams, physical therapy or regular exercise, knee bracing and/or weight loss.   This patient has experienced inadequate response or has a contraindication to intra articular steroid injections for at least 3 months.   This patient is not scheduled to have a total knee replacement within 6 months of  starting treatment with viscosupplementation.  S/P left TK revision: Doing well.   Trochanteric bursitis, right hip: Not currently symptomatic.   Traumatic incomplete tear of left rotator cuff, subsequent encounter  DDD (degenerative disc disease),  lumbar: No midline spinal tenderness.   Other medical conditions are listed as follows:  History of hyperlipidemia  Essential hypertension: Blood pressure was 130/84 today in the office.  Patient was advised to monitor blood pressure closely following the cortisone injection today.  History of gastroesophageal reflux (GERD)  Vitamin D deficiency  History of glaucoma  Anxiety and depression  Other proteinuria   Orders: Orders Placed This Encounter  Procedures   Large Joint Inj: R knee   XR KNEE 3 VIEW RIGHT   CBC with Differential/Platelet   COMPLETE METABOLIC PANEL WITH GFR   QuantiFERON-TB Gold Plus   No orders of the defined types were placed in this encounter.    Follow-Up Instructions: Return in about 3 months (around 02/08/2023) for Rheumatoid arthritis.   Gearldine Bienenstock, PA-C  Note - This record has been created using Dragon software.  Chart creation errors have been sought, but may not always  have been located. Such creation errors do not reflect on  the standard of medical care.

## 2022-11-08 ENCOUNTER — Ambulatory Visit: Payer: Medicare HMO | Attending: Physician Assistant | Admitting: Physician Assistant

## 2022-11-08 ENCOUNTER — Ambulatory Visit (INDEPENDENT_AMBULATORY_CARE_PROVIDER_SITE_OTHER): Payer: Medicare HMO

## 2022-11-08 ENCOUNTER — Telehealth: Payer: Self-pay

## 2022-11-08 ENCOUNTER — Encounter: Payer: Self-pay | Admitting: Physician Assistant

## 2022-11-08 VITALS — BP 130/84 | HR 90 | Resp 15 | Ht 69.0 in | Wt 230.4 lb

## 2022-11-08 DIAGNOSIS — Z8639 Personal history of other endocrine, nutritional and metabolic disease: Secondary | ICD-10-CM

## 2022-11-08 DIAGNOSIS — M7061 Trochanteric bursitis, right hip: Secondary | ICD-10-CM

## 2022-11-08 DIAGNOSIS — Z111 Encounter for screening for respiratory tuberculosis: Secondary | ICD-10-CM

## 2022-11-08 DIAGNOSIS — S46012D Strain of muscle(s) and tendon(s) of the rotator cuff of left shoulder, subsequent encounter: Secondary | ICD-10-CM

## 2022-11-08 DIAGNOSIS — I10 Essential (primary) hypertension: Secondary | ICD-10-CM | POA: Diagnosis not present

## 2022-11-08 DIAGNOSIS — Z96652 Presence of left artificial knee joint: Secondary | ICD-10-CM

## 2022-11-08 DIAGNOSIS — M5136 Other intervertebral disc degeneration, lumbar region: Secondary | ICD-10-CM | POA: Diagnosis not present

## 2022-11-08 DIAGNOSIS — E559 Vitamin D deficiency, unspecified: Secondary | ICD-10-CM

## 2022-11-08 DIAGNOSIS — G8929 Other chronic pain: Secondary | ICD-10-CM | POA: Diagnosis not present

## 2022-11-08 DIAGNOSIS — Z79899 Other long term (current) drug therapy: Secondary | ICD-10-CM

## 2022-11-08 DIAGNOSIS — M25561 Pain in right knee: Secondary | ICD-10-CM

## 2022-11-08 DIAGNOSIS — M0579 Rheumatoid arthritis with rheumatoid factor of multiple sites without organ or systems involvement: Secondary | ICD-10-CM

## 2022-11-08 DIAGNOSIS — M1711 Unilateral primary osteoarthritis, right knee: Secondary | ICD-10-CM

## 2022-11-08 DIAGNOSIS — F419 Anxiety disorder, unspecified: Secondary | ICD-10-CM

## 2022-11-08 DIAGNOSIS — M51369 Other intervertebral disc degeneration, lumbar region without mention of lumbar back pain or lower extremity pain: Secondary | ICD-10-CM

## 2022-11-08 DIAGNOSIS — Z8669 Personal history of other diseases of the nervous system and sense organs: Secondary | ICD-10-CM

## 2022-11-08 DIAGNOSIS — Z8719 Personal history of other diseases of the digestive system: Secondary | ICD-10-CM

## 2022-11-08 DIAGNOSIS — R808 Other proteinuria: Secondary | ICD-10-CM

## 2022-11-08 DIAGNOSIS — F32A Depression, unspecified: Secondary | ICD-10-CM

## 2022-11-08 MED ORDER — TRIAMCINOLONE ACETONIDE 40 MG/ML IJ SUSP
40.0000 mg | INTRAMUSCULAR | Status: AC | PRN
Start: 2022-11-08 — End: 2022-11-08
  Administered 2022-11-08: 40 mg via INTRA_ARTICULAR

## 2022-11-08 MED ORDER — LIDOCAINE HCL 1 % IJ SOLN
1.5000 mL | INTRAMUSCULAR | Status: AC | PRN
Start: 2022-11-08 — End: 2022-11-08
  Administered 2022-11-08: 1.5 mL

## 2022-11-08 NOTE — Telephone Encounter (Signed)
Please apply for right knee visco per Sherron Ales, PA-C. Thanks!

## 2022-11-08 NOTE — Patient Instructions (Signed)

## 2022-11-09 NOTE — Progress Notes (Signed)
Patient remains anemic. Potassium is borderline low-3.3. May be due to hydrochlorothiazide use.  Please forward results to PCP.    ALT is borderline elevated-33.  Please clarify if she is taking any tylenol? Alcohol use?

## 2022-11-09 NOTE — Telephone Encounter (Signed)
VOB submitted for Orthovisc, Right knee(s) BV pending 

## 2022-11-10 ENCOUNTER — Ambulatory Visit: Payer: Medicare HMO | Admitting: Internal Medicine

## 2022-11-10 LAB — COMPLETE METABOLIC PANEL WITH GFR
AG Ratio: 1.4 (calc) (ref 1.0–2.5)
ALT: 33 U/L — ABNORMAL HIGH (ref 6–29)
AST: 30 U/L (ref 10–35)
Albumin: 4.1 g/dL (ref 3.6–5.1)
Alkaline phosphatase (APISO): 54 U/L (ref 37–153)
BUN: 14 mg/dL (ref 7–25)
CO2: 24 mmol/L (ref 20–32)
Calcium: 9.6 mg/dL (ref 8.6–10.4)
Chloride: 105 mmol/L (ref 98–110)
Creat: 0.81 mg/dL (ref 0.50–1.05)
Globulin: 2.9 g/dL (ref 1.9–3.7)
Glucose, Bld: 97 mg/dL (ref 65–99)
Potassium: 3.3 mmol/L — ABNORMAL LOW (ref 3.5–5.3)
Sodium: 140 mmol/L (ref 135–146)
Total Bilirubin: 0.5 mg/dL (ref 0.2–1.2)
Total Protein: 7 g/dL (ref 6.1–8.1)
eGFR: 79 mL/min/{1.73_m2} (ref >=60)

## 2022-11-10 LAB — CBC WITH DIFFERENTIAL/PLATELET
Absolute Monocytes: 439 {cells}/uL (ref 200–950)
Basophils Absolute: 43 cells/uL (ref 0–200)
Basophils Relative: 0.6 %
Eosinophils Absolute: 173 {cells}/uL (ref 15–500)
Eosinophils Relative: 2.4 %
HCT: 30.6 % — ABNORMAL LOW (ref 35.0–45.0)
Hemoglobin: 10.2 g/dL — ABNORMAL LOW (ref 11.7–15.5)
Lymphs Abs: 1181 {cells}/uL (ref 850–3900)
MCH: 28.3 pg (ref 27.0–33.0)
MCHC: 33.3 g/dL (ref 32.0–36.0)
MCV: 85 fL (ref 80.0–100.0)
MPV: 10.1 fL (ref 7.5–12.5)
Monocytes Relative: 6.1 %
Neutro Abs: 5364 {cells}/uL (ref 1500–7800)
Neutrophils Relative %: 74.5 %
Platelets: 143 10*3/uL (ref 140–400)
RBC: 3.6 10*6/uL — ABNORMAL LOW (ref 3.80–5.10)
RDW: 13.5 % (ref 11.0–15.0)
Total Lymphocyte: 16.4 %
WBC: 7.2 10*3/uL (ref 3.8–10.8)

## 2022-11-10 LAB — QUANTIFERON-TB GOLD PLUS
Mitogen-NIL: 7.92 [IU]/mL
NIL: 0.02 IU/mL
QuantiFERON-TB Gold Plus: NEGATIVE
TB1-NIL: 0.01 [IU]/mL
TB2-NIL: 0 [IU]/mL

## 2022-11-10 NOTE — Progress Notes (Signed)
TB gold negative

## 2022-11-16 NOTE — Telephone Encounter (Signed)
Please call to schedule visco injections.  Approved for Orthovisc, Right knee(s). [employer] Stable Deductible does not apply $10 Co-pay Once the OOP has been met $3500 (met $31.57), patient is covered at 100% Prior authorization not required Reference #[id number] / [id number]

## 2022-11-19 ENCOUNTER — Other Ambulatory Visit: Payer: Self-pay | Admitting: Physician Assistant

## 2022-11-20 ENCOUNTER — Telehealth: Payer: Self-pay | Admitting: Internal Medicine

## 2022-11-20 NOTE — Telephone Encounter (Signed)
Patient has appointment scheduled on 11/23/2022.   Prescription Request  11/20/2022  LOV: 06/15/2022  What is the name of the medication or equipment?  HYDROcodone-acetaminophen (NORCO) 10-325 MG tablet   Have you contacted your pharmacy to request a refill? No   Which pharmacy would you like this sent to?  [id number] [place], [place] - [address] [place] [address] Phone: [phone] Fax: [phone]    Patient notified that their request is being sent to the clinical staff for review and that they should receive a response within 2 business days.   Please advise at Mobile [phone] (mobile)

## 2022-11-20 NOTE — Telephone Encounter (Signed)
Pt stated not taking norco on Nov 08 2022

## 2022-11-20 NOTE — Telephone Encounter (Signed)
Last Fill: 09/04/2022  Labs: 11/08/2022 remains anemic. Potassium is borderline low-3.3.  ALT is borderline elevated-33   Next Visit: 12/26/2022  Last Visit: 11/08/2022  DX: Rheumatoid arthritis involving multiple sites with positive rheumatoid factor   Current Dose per office note 11/08/2022: Methotrexate 4 tablets by mouth once weekly   Okay to refill Methotrexate?

## 2022-11-22 ENCOUNTER — Telehealth: Payer: Self-pay | Admitting: Internal Medicine

## 2022-11-22 MED ORDER — HYDROCODONE-ACETAMINOPHEN 10-325 MG PO TABS: 1.0000 | ORAL_TABLET | Freq: Four times a day (QID) | ORAL | 0 refills | Status: DC | PRN

## 2022-11-22 NOTE — Telephone Encounter (Signed)
Patient called and would like a med refill on HYDROcodone-acetaminophen (NORCO) 10-325 MG tablet  [facility] [address].

## 2022-11-22 NOTE — Addendum Note (Signed)
Addended by: Corwin Levins on: 11/22/2022 04:50 PM   Modules accepted: Orders

## 2022-11-23 ENCOUNTER — Ambulatory Visit: Payer: Medicare HMO | Admitting: Internal Medicine

## 2022-11-29 NOTE — Telephone Encounter (Signed)
LMOM for patient to call and schedule Orthovisc injections.

## 2022-12-04 ENCOUNTER — Ambulatory Visit: Payer: Medicare HMO | Admitting: Internal Medicine

## 2022-12-11 ENCOUNTER — Other Ambulatory Visit: Payer: Self-pay | Admitting: Internal Medicine

## 2022-12-11 ENCOUNTER — Other Ambulatory Visit: Payer: Self-pay | Admitting: Physician Assistant

## 2022-12-13 NOTE — Progress Notes (Deleted)
Office Visit Note  Patient: Pamela [name]             Date of Birth: October 02, 1955           MRN: [id number]             PCP: Corwin Levins, MD Referring: Corwin Levins, MD Visit Date: 12/26/2022 Occupation: @GUAROCC @  Subjective:  No chief complaint on file.   History of Present Illness: Pamela [name] is a 67 y.o. female ***     Activities of Daily Living:  Patient reports morning stiffness for *** {minute/hour:19697}.   Patient {ACTIONS;DENIES/REPORTS:21021675::"Denies"} nocturnal pain.  Difficulty dressing/grooming: {ACTIONS;DENIES/REPORTS:21021675::"Denies"} Difficulty climbing stairs: {ACTIONS;DENIES/REPORTS:21021675::"Denies"} Difficulty getting out of chair: {ACTIONS;DENIES/REPORTS:21021675::"Denies"} Difficulty using hands for taps, buttons, cutlery, and/or writing: {ACTIONS;DENIES/REPORTS:21021675::"Denies"}  No Rheumatology ROS completed.   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   Vitamin D deficiency 04/03/2021   Cough 03/29/2021   Acute upper respiratory infection 02/15/2021   Snoring 02/15/2021   Spinal stenosis of lumbar region 08/05/2019   Inability to walk 08/05/2019   Hives 07/08/2019   History of glaucoma 04/24/2019   Bursitis of left hip 12/25/2018   MGUS (monoclonal gammopathy of unknown significance) 05/11/2018   Rheumatoid arthritis involving multiple sites with positive rheumatoid factor (HCC) 05/03/2018   High risk medication use 05/03/2018   Status post total left knee replacement 05/03/2018   DDD (degenerative disc disease), lumbar 05/03/2018   Primary osteoarthritis of right knee 04/05/2018   Arthritis of left wrist 12/19/2017   Left rotator cuff tear 12/19/2017   Left wrist pain 12/12/2017   Left shoulder pain 08/22/2017   Acute pain of right knee 04/17/2017   Pain of left  calf 04/13/2017   S/P left TK revision 10/09/2016   Hyperglycemia 09/23/2015   Anxiety state 09/23/2015   Abnormal urine odor 08/25/2015   GERD without esophagitis 08/25/2015   Upper abdominal pain 04/16/2015   Early satiety 04/16/2015   Dysuria 04/16/2015   Encounter for well adult exam with abnormal findings 06/18/2014   Greater trochanteric bursitis of right hip 01/06/2013   Left lumbar radiculitis 04/03/2012   Eczema 04/03/2012   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   BURSITIS, RIGHT HIP 08/25/2009   INSOMNIA-SLEEP DISORDER-UNSPEC 08/25/2009   FATIGUE 08/25/2009   Cervicalgia 08/14/2007   Depression 03/15/2007   HLD (hyperlipidemia) 12/18/2006   Essential hypertension 12/18/2006   Allergic rhinitis 12/18/2006   RENAL CALCULUS, HX OF 12/18/2006    Past Medical History:  Diagnosis Date   ALLERGIC RHINITIS 12/18/2006   Qualifier: Diagnosis of  By: Genelle Gather CMA, [place]     Anxiety    Arthritis    DEPRESSION, SITUATIONAL 03/15/2007   Qualifier: Diagnosis of  By: Nena Jordan    GERD (gastroesophageal reflux disease)    Headache    occasional   History of kidney stones    HYPERLIPIDEMIA 12/18/2006   Qualifier: History of  By: Genelle Gather CMA, [place]     Hypertension    HYPERTENSION 12/18/2006   Qualifier: Diagnosis of  By: Genelle Gather CMA, [place]     INSOMNIA-SLEEP DISORDER-UNSPEC 08/25/2009   Qualifier: Diagnosis of  By: Jonny Ruiz MD, Len Blalock    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) 04/03/2012   Pneumonia    RENAL CALCULUS, HX OF 12/18/2006   Qualifier: Diagnosis of  By:  Shoffner CMA, [place]      Family History  Problem Relation Age of Onset   Stroke Mother    Heart disease Father    Lung cancer Sister    Lung cancer Brother    Healthy Daughter    Healthy Daughter    Healthy Daughter    Colon cancer Neg Hx    Esophageal cancer Neg Hx    Pancreatic cancer Neg Hx    Stomach cancer Neg Hx    Past Surgical History:  Procedure Laterality Date   CERVICAL DISC SURGERY      x 3   CONVERSION TO TOTAL KNEE Left 10/09/2016   Procedure: Conversion left uni compartment arthroplasty to total knee arthroplasty;  Surgeon: Pamela Romans, MD;  Location: [facility];  Service: Orthopedics;  Laterality: Left;  90 mins   KNEE ARTHROSCOPY     partial   TOOTH EXTRACTION  2024   x2   total left knee arthoplasty     10/09/16 Dr. Charlann Boxer   Social History   Social History Narrative   Not on file   Immunization History  Administered Date(s) Administered   Fluad Quad(high Dose 65+) 04/16/2020, 12/24/2020, 12/19/2021   Influenza Whole 11/12/2012   Influenza,inj,Quad PF,6+ Mos 11/29/2016, 12/11/2017, 12/25/2018   Influenza-Unspecified 12/15/2013, 12/12/2015, 11/29/2016   PNEUMOCOCCAL CONJUGATE-20 06/15/2022   Td 11/03/2002   Tdap 09/23/2015   Zoster Recombinant(Shingrix) 03/22/2018, 08/20/2018   Zoster, Live 08/12/2012     Objective: Vital Signs: There were no vitals taken for this visit.   Physical Exam   Musculoskeletal Exam: ***  CDAI Exam: CDAI Score: -- Patient Global: --; Provider Global: -- Swollen: --; Tender: -- Joint Exam 12/26/2022   No joint exam has been documented for this visit   There is currently no information documented on the homunculus. Go to the Rheumatology activity and complete the homunculus joint exam.  Investigation: No additional findings.  Imaging: No results found.  Recent Labs: Lab Results  Component Value Date   WBC 7.2 11/08/2022   HGB 10.2 (L) 11/08/2022   PLT 143 11/08/2022   NA 140 11/08/2022   K 3.3 (L) 11/08/2022   CL 105 11/08/2022   CO2 24 11/08/2022   GLUCOSE 97 11/08/2022   BUN 14 11/08/2022   CREATININE 0.81 11/08/2022   BILITOT 0.5 11/08/2022   ALKPHOS 55 06/15/2022   AST 30 11/08/2022   ALT 33 (H) 11/08/2022   PROT 7.0 11/08/2022   ALBUMIN 4.1 06/15/2022   CALCIUM 9.6 11/08/2022   GFRAA 74 05/14/2020   QFTBGOLDPLUS NEGATIVE 11/08/2022    Speciality Comments: PLQ Eye Exam: 03/26/2019 WNL @ [facility]  M McFarland OD PA FOllow up in 1 year  Procedures:  No procedures performed Allergies: Cymbalta [duloxetine hcl]   Assessment / Plan:     Visit Diagnoses: No diagnosis found.  Orders: No orders of the defined types were placed in this encounter.  No orders of the defined types were placed in this encounter.   Face-to-face time spent with patient was *** minutes. Greater than 50% of time was spent in counseling and coordination of care.  Follow-Up Instructions: No follow-ups on file.   Ellen Henri, CMA  Note - This record has been created using Animal nutritionist.  Chart creation errors have been sought, but may not always  have been located. Such creation errors do not reflect on  the standard of medical care.

## 2022-12-15 ENCOUNTER — Ambulatory Visit: Payer: Medicare HMO | Admitting: Internal Medicine

## 2022-12-21 ENCOUNTER — Ambulatory Visit: Payer: Medicare HMO | Admitting: Internal Medicine

## 2022-12-25 ENCOUNTER — Ambulatory Visit: Payer: Medicare HMO | Admitting: Internal Medicine

## 2022-12-25 ENCOUNTER — Encounter: Payer: Self-pay | Admitting: Internal Medicine

## 2022-12-25 VITALS — BP 108/68 | HR 85 | Temp 98.5°F | Ht 69.0 in | Wt 232.8 lb

## 2022-12-25 DIAGNOSIS — R739 Hyperglycemia, unspecified: Secondary | ICD-10-CM

## 2022-12-25 DIAGNOSIS — Z23 Encounter for immunization: Secondary | ICD-10-CM

## 2022-12-25 DIAGNOSIS — E538 Deficiency of other specified B group vitamins: Secondary | ICD-10-CM | POA: Diagnosis not present

## 2022-12-25 DIAGNOSIS — G894 Chronic pain syndrome: Secondary | ICD-10-CM

## 2022-12-25 DIAGNOSIS — I10 Essential (primary) hypertension: Secondary | ICD-10-CM | POA: Diagnosis not present

## 2022-12-25 DIAGNOSIS — E559 Vitamin D deficiency, unspecified: Secondary | ICD-10-CM | POA: Diagnosis not present

## 2022-12-25 DIAGNOSIS — M48061 Spinal stenosis, lumbar region without neurogenic claudication: Secondary | ICD-10-CM | POA: Diagnosis not present

## 2022-12-25 MED ORDER — HYDROCODONE-ACETAMINOPHEN 10-325 MG PO TABS: 1.0000 | ORAL_TABLET | Freq: Four times a day (QID) | ORAL | 0 refills | Status: DC | PRN

## 2022-12-25 MED ORDER — PREDNISONE 10 MG PO TABS: ORAL_TABLET | ORAL | 0 refills | Status: DC

## 2022-12-25 MED ORDER — GABAPENTIN 600 MG PO TABS: 600.0000 mg | ORAL_TABLET | Freq: Three times a day (TID) | ORAL | 1 refills | Status: DC

## 2022-12-25 NOTE — Progress Notes (Unsigned)
Patient ID: Pamela [name], female   DOB: 1956/03/09, 67 y.o.   MRN: [id number]        Chief Complaint: follow up chronic lbp with recent fall and worsening weakness, htn, hyperglycemia, low vit d       HPI:  Pamela [name] is a 67 y.o. female here with c/o chronic lbp with recent fallx 2 with worsening right > left weakness, still trying to avoid lumbar surgury recommended.  Pt denies chest pain, increased sob or doe, wheezing, orthopnea, PND, increased LE swelling, palpitations, dizziness or syncope.   Pt denies polydipsia, polyuria, or new focal neuro s/s.    Pt denies fever, wt loss, night sweats, loss of appetite, or other constitutional symptoms         Wt Readings from Last 3 Encounters:  12/25/22 232 lb 12.8 oz (105.6 kg)  11/08/22 230 lb 6.4 oz (104.5 kg)  08/15/22 245 lb (111.1 kg)   BP Readings from Last 3 Encounters:  12/25/22 108/68  11/08/22 130/84  07/18/22 102/70         Past Medical History:  Diagnosis Date   ALLERGIC RHINITIS 12/18/2006   Qualifier: Diagnosis of  By: Genelle Gather CMA, [place]     Anxiety    Arthritis    DEPRESSION, SITUATIONAL 03/15/2007   Qualifier: Diagnosis of  By: Nena Jordan    GERD (gastroesophageal reflux disease)    Headache    occasional   History of kidney stones    HYPERLIPIDEMIA 12/18/2006   Qualifier: History of  By: Genelle Gather CMA, [place]     Hypertension    HYPERTENSION 12/18/2006   Qualifier: Diagnosis of  By: Genelle Gather CMA, [place]     INSOMNIA-SLEEP DISORDER-UNSPEC 08/25/2009   Qualifier: Diagnosis of  By: Jonny Ruiz MD, Len Blalock    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) 04/03/2012   Pneumonia    RENAL CALCULUS, HX OF 12/18/2006   Qualifier: Diagnosis of  By: Genelle Gather CMA, [place]     Past Surgical History:  Procedure Laterality Date   CERVICAL DISC SURGERY     x 3   CONVERSION TO TOTAL KNEE Left 10/09/2016   Procedure: Conversion left uni compartment arthroplasty to total knee arthroplasty;  Surgeon: Durene Romans,  MD;  Location: [facility];  Service: Orthopedics;  Laterality: Left;  90 mins   KNEE ARTHROSCOPY     partial   TOOTH EXTRACTION  2024   x2   total left knee arthoplasty     10/09/16 Dr. Charlann Boxer    reports that she quit smoking about 54 years ago. Her smoking use included cigarettes. She started smoking about 57 years ago. She has a 0.8 pack-year smoking history. She has never been exposed to tobacco smoke. She has never used smokeless tobacco. She reports current alcohol use. She reports that she does not use drugs. family history includes Healthy in her daughter, daughter, and daughter; Heart disease in her father; Lung cancer in her brother and sister; Stroke in her mother. Allergies  Allergen Reactions   Cymbalta [Duloxetine Hcl] Other (See Comments)    Feeling weird   Current Outpatient Medications on File Prior to Visit  Medication Sig Dispense Refill   adalimumab (HUMIRA, 2 PEN,) 40 MG/0.4ML pen Inject 0.4 mLs (40 mg total) into the skin every 14 (fourteen) days. 2 each 0   ALPRAZolam (XANAX) 0.25 MG tablet TAKE 1 TABLET BY MOUTH TWICE A DAY AS NEEDED 60 tablet 2   atorvastatin (LIPITOR) 20 MG  tablet TAKE 1 TABLET BY MOUTH EVERY DAY 90 tablet 3   citalopram (CELEXA) 40 MG tablet TAKE 1 TABLET BY MOUTH EVERY DAY 90 tablet 2   cyclobenzaprine (FLEXERIL) 5 MG tablet TAKE 1 TABLET BY MOUTH THREE TIMES A DAY AS NEEDED FOR MUSCLE SPASM 90 tablet 2   diclofenac Sodium (VOLTAREN ARTHRITIS PAIN) 1 % GEL Apply topically daily.     hydrochlorothiazide (MICROZIDE) 12.5 MG capsule TAKE 1 CAPSULE(12.5 MG) BY MOUTH DAILY 90 capsule 2   losartan (COZAAR) 100 MG tablet TAKE 1 TABLET BY MOUTH EVERY DAY 90 tablet 3   metoCLOPramide (REGLAN) 10 MG tablet TAKE 1 TABLET BY MOUTH IN THE MORNING AND IN THE EVENING 180 tablet 3   Multiple Vitamin (MULTIVITAMIN WITH MINERALS) TABS tablet Take 1 tablet by mouth daily.     pantoprazole (PROTONIX) 40 MG tablet TAKE 1 TABLET BY MOUTH TWICE A DAY 180 tablet 0   traZODone  (DESYREL) 50 MG tablet TAKE 1/2 TO 1 TABLET BY MOUTH AT BEDTIME AS NEEDED FOR SLEEP 90 tablet 1   zolpidem (AMBIEN) 10 MG tablet TAKE 1 TABLET BY MOUTH EVERY DAY AT BEDTIME AS NEEDED 90 tablet 1   methotrexate (RHEUMATREX) 2.5 MG tablet TAKE 4 TABLETS (10 MG TOTAL) BY MOUTH ONCE A WEEK (Patient not taking: Reported on 12/25/2022) 48 tablet 0   No current facility-administered medications on file prior to visit.        ROS:  All others reviewed and negative.  Objective        PE:  BP 108/68 (BP Location: Left Arm, Patient Position: Sitting, Cuff Size: Normal)   Pulse 85   Temp 98.5 F (36.9 C) (Oral)   Ht 5\' 9"  (1.753 m)   Wt 232 lb 12.8 oz (105.6 kg)   SpO2 96%   BMI 34.38 kg/m                 Constitutional: Pt appears in NAD               HENT: Head: NCAT.                Right Ear: External ear normal.                 Left Ear: External ear normal.                Eyes: . Pupils are equal, round, and reactive to light. Conjunctivae and EOM are normal               Nose: without d/c or deformity               Neck: Neck supple. Gross normal ROM               Cardiovascular: Normal rate and regular rhythm.                 Pulmonary/Chest: Effort normal and breath sounds without rales or wheezing.                Abd:  Soft, NT, ND, + BS, no organomegaly               Neurological: Pt is alert. At baseline orientation, motor with 4/5 Rle and 4+/5 Lle weakness               Skin: Skin is warm. No rashes, no other new lesions, LE edema - none  Psychiatric: Pt behavior is normal without agitation   Micro: none  Cardiac tracings I have personally interpreted today:  none  Pertinent Radiological findings (summarize): none   Lab Results  Component Value Date   WBC 7.2 11/08/2022   HGB 10.2 (L) 11/08/2022   HCT 30.6 (L) 11/08/2022   PLT 143 11/08/2022   GLUCOSE 97 11/08/2022   CHOL 168 06/15/2022   TRIG 58.0 06/15/2022   HDL 73.30 06/15/2022   LDLDIRECT 109.9  04/03/2012   LDLCALC 83 06/15/2022   ALT 33 (H) 11/08/2022   AST 30 11/08/2022   NA 140 11/08/2022   K 3.3 (L) 11/08/2022   CL 105 11/08/2022   CREATININE 0.81 11/08/2022   BUN 14 11/08/2022   CO2 24 11/08/2022   TSH 0.64 06/15/2022   INR 1.0 06/29/2008   HGBA1C 5.4 06/15/2022   MICROALBUR <0.7 06/15/2022   Assessment/Plan:  Pamela [name] is a 67 y.o. 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  Spinal stenosis of lumbar region With recent fall x 2 with worsening right > left weakness mild to mod, for increased gabapentin 600 tid, prednisone taper, refill hydrocodone prn, declines further PT for now, and refer pain management  Hyperglycemia Lab Results  Component Value Date   HGBA1C 5.4 06/15/2022   Stable, pt to continue current medical treatment - diet, wt control   Vitamin D deficiency Last vitamin D Lab Results  Component Value Date   VD25OH 22.14 (L) 06/15/2022   Low, to start oral replacement   Essential hypertension BP Readings from Last 3 Encounters:  12/25/22 108/68  11/08/22 130/84  07/18/22 102/70   Stable, pt to continue medical treatment losartan 100 every day, hct 12.5 qd  Followup: Return in about 6 months (around 06/25/2023).  Oliver Barre, MD 12/26/2022 9:21 PM [facility]

## 2022-12-25 NOTE — Patient Instructions (Signed)
Ok to increase the gabapentin 600 mg three times per day  Please take all new medication as prescribed - the prednisone  Please continue all other medications as before, including the hydrocodone  Please have the pharmacy call with any other refills you may need.  Please keep your appointments with your specialists as you may have planned  You will be contacted regarding the referral for: Pain management  Please call if you would like to start Physical Therapy  No further lab testing needed today  Please make an Appointment to return in 6 months, or sooner if needed, also with Lab Appointment for testing done 3-5 days before at the FIRST FLOOR Lab (so this is for TWO appointments - please see the scheduling desk as you leave)

## 2022-12-26 ENCOUNTER — Ambulatory Visit: Payer: Medicare HMO | Admitting: Rheumatology

## 2022-12-26 ENCOUNTER — Encounter: Payer: Self-pay | Admitting: Internal Medicine

## 2022-12-26 DIAGNOSIS — Z79899 Other long term (current) drug therapy: Secondary | ICD-10-CM

## 2022-12-26 DIAGNOSIS — M7061 Trochanteric bursitis, right hip: Secondary | ICD-10-CM

## 2022-12-26 DIAGNOSIS — I10 Essential (primary) hypertension: Secondary | ICD-10-CM

## 2022-12-26 DIAGNOSIS — S46012D Strain of muscle(s) and tendon(s) of the rotator cuff of left shoulder, subsequent encounter: Secondary | ICD-10-CM

## 2022-12-26 DIAGNOSIS — Z8639 Personal history of other endocrine, nutritional and metabolic disease: Secondary | ICD-10-CM

## 2022-12-26 DIAGNOSIS — F32A Depression, unspecified: Secondary | ICD-10-CM

## 2022-12-26 DIAGNOSIS — R808 Other proteinuria: Secondary | ICD-10-CM

## 2022-12-26 DIAGNOSIS — E559 Vitamin D deficiency, unspecified: Secondary | ICD-10-CM

## 2022-12-26 DIAGNOSIS — Z96652 Presence of left artificial knee joint: Secondary | ICD-10-CM

## 2022-12-26 DIAGNOSIS — Z8669 Personal history of other diseases of the nervous system and sense organs: Secondary | ICD-10-CM

## 2022-12-26 DIAGNOSIS — Z8719 Personal history of other diseases of the digestive system: Secondary | ICD-10-CM

## 2022-12-26 DIAGNOSIS — M51369 Other intervertebral disc degeneration, lumbar region without mention of lumbar back pain or lower extremity pain: Secondary | ICD-10-CM

## 2022-12-26 DIAGNOSIS — G8929 Other chronic pain: Secondary | ICD-10-CM

## 2022-12-26 DIAGNOSIS — M1711 Unilateral primary osteoarthritis, right knee: Secondary | ICD-10-CM

## 2022-12-26 DIAGNOSIS — M0579 Rheumatoid arthritis with rheumatoid factor of multiple sites without organ or systems involvement: Secondary | ICD-10-CM

## 2022-12-26 NOTE — Assessment & Plan Note (Signed)
Last vitamin D Lab Results  Component Value Date   VD25OH 22.14 (L) 06/15/2022   Low, to start oral replacement

## 2022-12-26 NOTE — Assessment & Plan Note (Signed)
With recent fall x 2 with worsening right > left weakness mild to mod, for increased gabapentin 600 tid, prednisone taper, refill hydrocodone prn, declines further PT for now, and refer pain management

## 2022-12-26 NOTE — Assessment & Plan Note (Signed)
BP Readings from Last 3 Encounters:  12/25/22 108/68  11/08/22 130/84  07/18/22 102/70   Stable, pt to continue medical treatment losartan 100 every day, hct 12.5 qd

## 2022-12-26 NOTE — Assessment & Plan Note (Signed)
Lab Results  Component Value Date   HGBA1C 5.4 06/15/2022   Stable, pt to continue current medical treatment  - diet, wt control

## 2022-12-27 NOTE — Progress Notes (Unsigned)
Office Visit Note  Patient: Pamela [name]             Date of Birth: [birth year]           MRN: [id number]             PCP: Corwin Levins, MD Referring: Corwin Levins, MD Visit Date: 12/28/2022 Occupation: @GUAROCC @  Subjective:  No chief complaint on file.   History of Present Illness: DEONNE KILLELEA is a 67 y.o. female ***     Activities of Daily Living:  Patient reports morning stiffness for *** {minute/hour:19697}.   Patient {ACTIONS;DENIES/REPORTS:21021675::"Denies"} nocturnal pain.  Difficulty dressing/grooming: {ACTIONS;DENIES/REPORTS:21021675::"Denies"} Difficulty climbing stairs: {ACTIONS;DENIES/REPORTS:21021675::"Denies"} Difficulty getting out of chair: {ACTIONS;DENIES/REPORTS:21021675::"Denies"} Difficulty using hands for taps, buttons, cutlery, and/or writing: {ACTIONS;DENIES/REPORTS:21021675::"Denies"}  No Rheumatology ROS completed.   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   Vitamin D deficiency 04/03/2021   Cough 03/29/2021   Acute upper respiratory infection 02/15/2021   Snoring 02/15/2021   Spinal stenosis of lumbar region 08/05/2019   Inability to walk 08/05/2019   Hives 07/08/2019   History of glaucoma 04/24/2019   Bursitis of left hip 12/25/2018   MGUS (monoclonal gammopathy of unknown significance) 05/11/2018   Rheumatoid arthritis involving multiple sites with positive rheumatoid factor (HCC) 05/03/2018   High risk medication use 05/03/2018   Status post total left knee replacement 05/03/2018   DDD (degenerative disc disease), lumbar 05/03/2018   Primary osteoarthritis of right knee 04/05/2018   Arthritis of left wrist 12/19/2017   Left rotator cuff tear 12/19/2017   Left wrist pain 12/12/2017   Left shoulder pain 08/22/2017   Acute pain of right knee 04/17/2017   Pain of left  calf 04/13/2017   S/P left TK revision 10/09/2016   Hyperglycemia 09/23/2015   Anxiety state 09/23/2015   Abnormal urine odor 08/25/2015   GERD without esophagitis 08/25/2015   Upper abdominal pain 04/16/2015   Early satiety 04/16/2015   Dysuria 04/16/2015   Encounter for well adult exam with abnormal findings 06/18/2014   Greater trochanteric bursitis of right hip 01/06/2013   Left lumbar radiculitis 04/03/2012   Eczema 04/03/2012   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   BURSITIS, RIGHT HIP 08/25/2009   INSOMNIA-SLEEP DISORDER-UNSPEC 08/25/2009   FATIGUE 08/25/2009   Cervicalgia 08/14/2007   Depression 03/15/2007   HLD (hyperlipidemia) 12/18/2006   Essential hypertension 12/18/2006   Allergic rhinitis 12/18/2006   RENAL CALCULUS, HX OF 12/18/2006    Past Medical History:  Diagnosis Date   ALLERGIC RHINITIS 12/18/2006   Qualifier: Diagnosis of  By: Genelle Gather CMA, [place]     Anxiety    Arthritis    DEPRESSION, SITUATIONAL 03/15/2007   Qualifier: Diagnosis of  By: Nena Jordan    GERD (gastroesophageal reflux disease)    Headache    occasional   History of kidney stones    HYPERLIPIDEMIA 12/18/2006   Qualifier: History of  By: Genelle Gather CMA, [place]     Hypertension    HYPERTENSION 12/18/2006   Qualifier: Diagnosis of  By: Genelle Gather CMA, [place]     INSOMNIA-SLEEP DISORDER-UNSPEC 08/25/2009   Qualifier: Diagnosis of  By: Jonny Ruiz MD, Len Blalock    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) 04/03/2012   Pneumonia    RENAL CALCULUS, HX OF 12/18/2006   Qualifier: Diagnosis of  By:  Shoffner CMA, [place]      Family History  Problem Relation Age of Onset   Stroke Mother    Heart disease Father    Lung cancer Sister    Lung cancer Brother    Healthy Daughter    Healthy Daughter    Healthy Daughter    Colon cancer Neg Hx    Esophageal cancer Neg Hx    Pancreatic cancer Neg Hx    Stomach cancer Neg Hx    Past Surgical History:  Procedure Laterality Date   CERVICAL DISC SURGERY      x 3   CONVERSION TO TOTAL KNEE Left 10/09/2016   Procedure: Conversion left uni compartment arthroplasty to total knee arthroplasty;  Surgeon: Durene Romans, MD;  Location: [facility];  Service: Orthopedics;  Laterality: Left;  90 mins   KNEE ARTHROSCOPY     partial   TOOTH EXTRACTION  2024   x2   total left knee arthoplasty     10/09/16 Dr. Charlann Boxer   Social History   Social History Narrative   Not on file   Immunization History  Administered Date(s) Administered   Fluad Quad(high Dose 65+) 04/16/2020, 12/24/2020, 12/19/2021   Fluad Trivalent(High Dose 65+) 12/25/2022   Influenza Whole 11/12/2012   Influenza,inj,Quad PF,6+ Mos 11/29/2016, 12/11/2017, 12/25/2018   Influenza-Unspecified 12/15/2013, 12/12/2015, 11/29/2016   PNEUMOCOCCAL CONJUGATE-20 06/15/2022   Td 11/03/2002   Tdap 09/23/2015   Zoster Recombinant(Shingrix) 03/22/2018, 08/20/2018   Zoster, Live 08/12/2012     Objective: Vital Signs: There were no vitals taken for this visit.   Physical Exam   Musculoskeletal Exam: ***  CDAI Exam: CDAI Score: -- Patient Global: --; Provider Global: -- Swollen: --; Tender: -- Joint Exam 12/28/2022   No joint exam has been documented for this visit   There is currently no information documented on the homunculus. Go to the Rheumatology activity and complete the homunculus joint exam.  Investigation: No additional findings.  Imaging: No results found.  Recent Labs: Lab Results  Component Value Date   WBC 7.2 11/08/2022   HGB 10.2 (L) 11/08/2022   PLT 143 11/08/2022   NA 140 11/08/2022   K 3.3 (L) 11/08/2022   CL 105 11/08/2022   CO2 24 11/08/2022   GLUCOSE 97 11/08/2022   BUN 14 11/08/2022   CREATININE 0.81 11/08/2022   BILITOT 0.5 11/08/2022   ALKPHOS 55 06/15/2022   AST 30 11/08/2022   ALT 33 (H) 11/08/2022   PROT 7.0 11/08/2022   ALBUMIN 4.1 06/15/2022   CALCIUM 9.6 11/08/2022   GFRAA 74 05/14/2020   QFTBGOLDPLUS NEGATIVE 11/08/2022    Speciality  Comments: PLQ Eye Exam: 03/26/2019 WNL @ [facility] [place] OD PA FOllow up in 1 year  Procedures:  No procedures performed Allergies: Cymbalta [duloxetine hcl]   Assessment / Plan:     Visit Diagnoses: No diagnosis found.  Orders: No orders of the defined types were placed in this encounter.  No orders of the defined types were placed in this encounter.   Face-to-face time spent with patient was *** minutes. Greater than 50% of time was spent in counseling and coordination of care.  Follow-Up Instructions: No follow-ups on file.   Ellen Henri, CMA  Note - This record has been created using Animal nutritionist.  Chart creation errors have been sought, but may not always  have been located. Such creation errors do not reflect on  the standard of medical care.

## 2022-12-28 ENCOUNTER — Encounter: Payer: Self-pay | Admitting: Rheumatology

## 2022-12-28 ENCOUNTER — Ambulatory Visit: Payer: Medicare HMO | Attending: Rheumatology | Admitting: Rheumatology

## 2022-12-28 VITALS — BP 119/80 | HR 89 | Resp 15 | Ht 68.0 in | Wt 232.2 lb

## 2022-12-28 DIAGNOSIS — D6489 Other specified anemias: Secondary | ICD-10-CM

## 2022-12-28 DIAGNOSIS — M0579 Rheumatoid arthritis with rheumatoid factor of multiple sites without organ or systems involvement: Secondary | ICD-10-CM

## 2022-12-28 DIAGNOSIS — M47816 Spondylosis without myelopathy or radiculopathy, lumbar region: Secondary | ICD-10-CM | POA: Diagnosis not present

## 2022-12-28 DIAGNOSIS — Z96652 Presence of left artificial knee joint: Secondary | ICD-10-CM

## 2022-12-28 DIAGNOSIS — I10 Essential (primary) hypertension: Secondary | ICD-10-CM

## 2022-12-28 DIAGNOSIS — E559 Vitamin D deficiency, unspecified: Secondary | ICD-10-CM

## 2022-12-28 DIAGNOSIS — M7061 Trochanteric bursitis, right hip: Secondary | ICD-10-CM | POA: Diagnosis not present

## 2022-12-28 DIAGNOSIS — S46012D Strain of muscle(s) and tendon(s) of the rotator cuff of left shoulder, subsequent encounter: Secondary | ICD-10-CM | POA: Diagnosis not present

## 2022-12-28 DIAGNOSIS — Z8639 Personal history of other endocrine, nutritional and metabolic disease: Secondary | ICD-10-CM

## 2022-12-28 DIAGNOSIS — F419 Anxiety disorder, unspecified: Secondary | ICD-10-CM

## 2022-12-28 DIAGNOSIS — Z8669 Personal history of other diseases of the nervous system and sense organs: Secondary | ICD-10-CM

## 2022-12-28 DIAGNOSIS — M1711 Unilateral primary osteoarthritis, right knee: Secondary | ICD-10-CM | POA: Diagnosis not present

## 2022-12-28 DIAGNOSIS — Z79899 Other long term (current) drug therapy: Secondary | ICD-10-CM | POA: Diagnosis not present

## 2022-12-28 DIAGNOSIS — F32A Depression, unspecified: Secondary | ICD-10-CM

## 2022-12-28 DIAGNOSIS — Z8719 Personal history of other diseases of the digestive system: Secondary | ICD-10-CM

## 2022-12-28 DIAGNOSIS — R808 Other proteinuria: Secondary | ICD-10-CM

## 2022-12-28 MED ORDER — HUMIRA (2 PEN) 40 MG/0.4ML ~~LOC~~ AJKT
40.0000 mg | AUTO-INJECTOR | SUBCUTANEOUS | 0 refills | Status: DC
Start: 2022-12-28 — End: 2023-08-13

## 2022-12-28 MED ORDER — LIDOCAINE HCL 1 % IJ SOLN
1.5000 mL | INTRAMUSCULAR | Status: AC | PRN
Start: 2022-12-28 — End: 2022-12-28
  Administered 2022-12-28: 1.5 mL

## 2022-12-28 MED ORDER — HYALURONAN 30 MG/2ML IX SOSY
30.0000 mg | PREFILLED_SYRINGE | INTRA_ARTICULAR | Status: AC | PRN
Start: 2022-12-28 — End: 2022-12-28
  Administered 2022-12-28: 30 mg via INTRA_ARTICULAR

## 2022-12-28 NOTE — Patient Instructions (Signed)
  Please schedule an appointment with Dr. Jonny Ruiz for evaluation of anemia.    Standing Labs We placed an order today for your standing lab work.   Please have your standing labs drawn in November and every 3 months  Please have your labs drawn 2 weeks prior to your appointment so that the provider can discuss your lab results at your appointment, if possible.  Please note that you may see your imaging and lab results in MyChart before we have reviewed them. We will contact you once all results are reviewed. Please allow our office up to 72 hours to thoroughly review all of the results before contacting the office for clarification of your results.  WALK-IN LAB HOURS  Monday through Thursday from 8:00 am -12:30 pm and 1:00 pm-5:00 pm and Friday from 8:00 am-12:00 pm.  Patients with office visits requiring labs will be seen before walk-in labs.  You may encounter longer than normal wait times. Please allow additional time. Wait times may be shorter on  Monday and Thursday afternoons.  We do not book appointments for walk-in labs. We appreciate your patience and understanding with our staff.   Labs are drawn by [facility]. Please bring your co-pay at the time of your lab draw.  You may receive a bill from [facility] for your lab work.  Please note if you are on Hydroxychloroquine and and an order has been placed for a Hydroxychloroquine level,  you will need to have it drawn 4 hours or more after your last dose.  If you wish to have your labs drawn at another location, please call the office 24 hours in advance so we can fax the orders.  The office is located at [address], [address], [place], [place] [address]   If you have any questions regarding directions or hours of operation,  please call [phone].   As a reminder, please drink plenty of water prior to coming for your lab work. Thanks!   Vaccines You are taking a medication(s) that can suppress your immune system.  The following  immunizations are recommended: Flu annually Covid-19  RSV Td/Tdap (tetanus, diphtheria, pertussis) every 10 years Pneumonia (Prevnar 15 then Pneumovax 23 at least 1 year apart.  Alternatively, can take Prevnar 20 without needing additional dose) Shingrix: 2 doses from 4 weeks to 6 months apart  Please check with your PCP to make sure you are up to date.   If you have signs or symptoms of an infection or start antibiotics: First, call your PCP for workup of your infection. Hold your medication through the infection, until you complete your antibiotics, and until symptoms resolve if you take the following: Injectable medication (Actemra, Benlysta, Cimzia, Cosentyx, Enbrel, Humira, Kevzara, Orencia, Remicade, Simponi, Stelara, Taltz, Tremfya) Methotrexate Leflunomide (Arava) Mycophenolate (Cellcept) Osborne [place], or Rinvoq  Get an annual skin examination to screen for skin cancer.  Please use sunscreen and sun protection.

## 2023-01-02 ENCOUNTER — Ambulatory Visit: Payer: Medicare HMO | Attending: Physician Assistant | Admitting: Physician Assistant

## 2023-01-02 DIAGNOSIS — M1711 Unilateral primary osteoarthritis, right knee: Secondary | ICD-10-CM | POA: Diagnosis not present

## 2023-01-02 MED ORDER — LIDOCAINE HCL 1 % IJ SOLN
1.5000 mL | INTRAMUSCULAR | Status: AC | PRN
Start: 2023-01-02 — End: 2023-01-02
  Administered 2023-01-02: 1.5 mL

## 2023-01-02 MED ORDER — HYALURONAN 30 MG/2ML IX SOSY
30.0000 mg | PREFILLED_SYRINGE | INTRA_ARTICULAR | Status: AC | PRN
Start: 2023-01-02 — End: 2023-01-02
  Administered 2023-01-02: 30 mg via INTRA_ARTICULAR

## 2023-01-02 NOTE — Progress Notes (Addendum)
Procedure Note  Patient: Pamela [name]             Date of Birth: January 21, 1956           MRN: [id number]             Visit Date: 01/02/2023  Procedures: Visit Diagnoses:  1. Primary osteoarthritis of right knee    Orthovisc #2 right knee, B/B Large Joint Inj: R knee on 01/02/2023 1:43 PM Indications: pain Details: 25 G 1.5 in needle, medial approach  Arthrogram: No  Medications: 1.5 mL lidocaine 1 %; 30 mg Hyaluronan 30 MG/2ML Aspirate: 0 mL Outcome: tolerated well, no immediate complications Procedure, treatment alternatives, risks and benefits explained, specific risks discussed. Consent was given by the patient. Immediately prior to procedure a time out was called to verify the correct patient, procedure, equipment, support staff and site/side marked as required. Patient was prepped and draped in the usual sterile fashion.     Patient tolerated the procedures well.  Aftercare was discussed.  Sherron Ales, PA-C

## 2023-01-03 ENCOUNTER — Encounter: Payer: Self-pay | Admitting: Internal Medicine

## 2023-01-03 ENCOUNTER — Ambulatory Visit (INDEPENDENT_AMBULATORY_CARE_PROVIDER_SITE_OTHER): Payer: Medicare HMO | Admitting: Internal Medicine

## 2023-01-03 VITALS — BP 126/80 | HR 82 | Temp 98.2°F | Ht 68.0 in | Wt 233.0 lb

## 2023-01-03 DIAGNOSIS — D472 Monoclonal gammopathy: Secondary | ICD-10-CM | POA: Diagnosis not present

## 2023-01-03 DIAGNOSIS — D649 Anemia, unspecified: Secondary | ICD-10-CM | POA: Insufficient documentation

## 2023-01-03 DIAGNOSIS — E559 Vitamin D deficiency, unspecified: Secondary | ICD-10-CM | POA: Diagnosis not present

## 2023-01-03 DIAGNOSIS — E782 Mixed hyperlipidemia: Secondary | ICD-10-CM

## 2023-01-03 DIAGNOSIS — R739 Hyperglycemia, unspecified: Secondary | ICD-10-CM | POA: Diagnosis not present

## 2023-01-03 DIAGNOSIS — I10 Essential (primary) hypertension: Secondary | ICD-10-CM | POA: Diagnosis not present

## 2023-01-03 NOTE — Patient Instructions (Signed)
Please continue all other medications as before, and refills have been done if requested.  Please have the pharmacy call with any other refills you may need.  Please continue your efforts at being more active, low cholesterol diet, and weight control.  Please keep your appointments with your specialists as you may have planned  You will be contacted regarding the referral for: Hematology for "MGUS" and anemia  Please make an Appointment to return in April 2025 as planned, or sooner if needed

## 2023-01-03 NOTE — Progress Notes (Signed)
Patient ID: Pamela [name], female   DOB: [birth year], 67 y.o.   MRN: [id number]        Chief Complaint: follow up MGUS, low vit d, hyperglycemia, htn, hld, anemia       HPI:  Pamela [name] is a 67 y.o. female here overall doing ok.  Pt denies chest pain, increased sob or doe, wheezing, orthopnea, PND, increased LE swelling, palpitations, dizziness or syncope.   Pt denies polydipsia, polyuria, or new focal neuro s/s.    Pt denies fever, wt loss, night sweats, loss of appetite, or other constitutional symptoms  Has been lost to f/u for MGUS with heme.         Wt Readings from Last 3 Encounters:  01/03/23 233 lb (105.7 kg)  12/28/22 232 lb 3.2 oz (105.3 kg)  12/25/22 232 lb 12.8 oz (105.6 kg)   BP Readings from Last 3 Encounters:  01/03/23 126/80  12/28/22 119/80  12/25/22 108/68         Past Medical History:  Diagnosis Date   ALLERGIC RHINITIS 12/18/2006   Qualifier: Diagnosis of  By: Genelle Gather CMA, [place]     Anxiety    Arthritis    DEPRESSION, SITUATIONAL 03/15/2007   Qualifier: Diagnosis of  By: Nena Jordan    GERD (gastroesophageal reflux disease)    Headache    occasional   History of kidney stones    HYPERLIPIDEMIA 12/18/2006   Qualifier: History of  By: Genelle Gather CMA, [place]     Hypertension    HYPERTENSION 12/18/2006   Qualifier: Diagnosis of  By: Genelle Gather CMA, [place]     INSOMNIA-SLEEP DISORDER-UNSPEC 08/25/2009   Qualifier: Diagnosis of  By: Jonny Ruiz MD, Len Blalock    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 Charlann Boxer    reports that she quit smoking about 54 years ago. Her smoking use included cigarettes. She started smoking about 57 years ago. She has a 0.8 pack-year smoking history. She has never been exposed to tobacco smoke. She has never used smokeless tobacco. She reports current alcohol use. She reports that she does not use drugs. family history includes Healthy in her daughter, daughter, and daughter; Heart disease in her father; Lung cancer in her brother and sister; Stroke in her mother. Allergies  Allergen Reactions   Cymbalta [Duloxetine Hcl] Other (See Comments)    Feeling weird   Current Outpatient Medications on File Prior to Visit  Medication Sig Dispense Refill   adalimumab (HUMIRA, 2 PEN,) 40 MG/0.4ML pen Inject 0.4 mLs (40 mg total) into the skin every 14 (fourteen) days. 6 each 0   ALPRAZolam (XANAX) 0.25 MG tablet TAKE 1 TABLET BY MOUTH TWICE A DAY AS NEEDED 60 tablet 2   atorvastatin (LIPITOR) 20 MG tablet TAKE 1 TABLET BY MOUTH EVERY DAY 90 tablet 3  citalopram (CELEXA) 40 MG tablet TAKE 1 TABLET BY MOUTH EVERY DAY 90 tablet 2   cyclobenzaprine (FLEXERIL) 5 MG tablet TAKE 1 TABLET BY MOUTH THREE TIMES A DAY AS NEEDED FOR MUSCLE SPASM 90 tablet 2   diclofenac Sodium (VOLTAREN ARTHRITIS PAIN) 1 % GEL Apply topically daily.     gabapentin (NEURONTIN) 600 MG tablet Take 1 tablet (600 mg total) by mouth 3 (three) times daily. 270 tablet 1   hydrochlorothiazide (MICROZIDE) 12.5 MG capsule TAKE 1 CAPSULE(12.5 MG) BY MOUTH DAILY 90 capsule 2   HYDROcodone-acetaminophen (NORCO) 10-325 MG tablet Take 1 tablet by mouth every 6 (six) hours as needed. 120 tablet 0   losartan (COZAAR) 100 MG tablet TAKE 1 TABLET BY MOUTH EVERY DAY 90 tablet 3   methotrexate (RHEUMATREX) 2.5 MG tablet TAKE 4 TABLETS (10 MG TOTAL) BY MOUTH ONCE A WEEK 48 tablet 0   metoCLOPramide (REGLAN) 10 MG tablet TAKE 1 TABLET BY MOUTH  IN THE MORNING AND IN THE EVENING 180 tablet 3   Multiple Vitamin (MULTIVITAMIN WITH MINERALS) TABS tablet Take 1 tablet by mouth daily.     pantoprazole (PROTONIX) 40 MG tablet TAKE 1 TABLET BY MOUTH TWICE A DAY 180 tablet 0   predniSONE (DELTASONE) 10 MG tablet 3 tabs by mouth per day for 3 days,2tabs per day for 3 days,1tab per day for 3 days 18 tablet 0   traZODone (DESYREL) 50 MG tablet TAKE 1/2 TO 1 TABLET BY MOUTH AT BEDTIME AS NEEDED FOR SLEEP 90 tablet 1   zolpidem (AMBIEN) 10 MG tablet TAKE 1 TABLET BY MOUTH EVERY DAY AT BEDTIME AS NEEDED 90 tablet 1   No current facility-administered medications on file prior to visit.        ROS:  All others reviewed and negative.  Objective        PE:  BP 126/80 (BP Location: Left Arm, Patient Position: Sitting, Cuff Size: Normal)   Pulse 82   Temp 98.2 F (36.8 C) (Oral)   Ht 5\' 8"  (1.727 m)   Wt 233 lb (105.7 kg)   SpO2 98%   BMI 35.43 kg/m                 Constitutional: Pt appears in NAD               HENT: Head: NCAT.                Right Ear: External ear normal.                 Left Ear: External ear normal.                Eyes: . Pupils are equal, round, and reactive to light. Conjunctivae and EOM are normal               Nose: without d/c or deformity               Neck: Neck supple. Gross normal ROM               Cardiovascular: Normal rate and regular rhythm.                 Pulmonary/Chest: Effort normal and breath sounds without rales or wheezing.                Abd:  Soft, NT, ND, + BS, no organomegaly               Neurological: Pt  is alert. At baseline orientation, motor grossly intact               Skin: Skin is warm. No rashes, no other new lesions, LE edema - none               Psychiatric: Pt behavior is normal without agitation   Micro: none  Cardiac tracings I have personally interpreted today:  none  Pertinent Radiological findings (summarize): none   Lab Results  Component Value Date   WBC 7.2  11/08/2022   HGB 10.2 (L) 11/08/2022   HCT 30.6 (L) 11/08/2022   PLT 143 11/08/2022   GLUCOSE 97 11/08/2022   CHOL 168 06/15/2022   TRIG 58.0 06/15/2022   HDL 73.30 06/15/2022   LDLDIRECT 109.9 04/03/2012   LDLCALC 83 06/15/2022   ALT 33 (H) 11/08/2022   AST 30 11/08/2022   NA 140 11/08/2022   K 3.3 (L) 11/08/2022   CL 105 11/08/2022   CREATININE 0.81 11/08/2022   BUN 14 11/08/2022   CO2 24 11/08/2022   TSH 0.64 06/15/2022   INR 1.0 06/29/2008   HGBA1C 5.4 06/15/2022   MICROALBUR <0.7 06/15/2022   Assessment/Plan:  YOLETTE BELINSKI is a 68 y.o. 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  MGUS (monoclonal gammopathy of unknown significance) Pt due for f/u - for referral heme  Vitamin D deficiency Last vitamin D Lab Results  Component Value Date   VD25OH 22.14 (L) 06/15/2022   Low, to start oral replacement   Hyperglycemia Lab Results  Component Value Date   HGBA1C 5.4 06/15/2022   Stable, pt to continue current medical treatment  - diet, wt control   HLD (hyperlipidemia) Lab Results  Component Value Date   LDLCALC 83 06/15/2022   Stable, pt to continue current statin lipitor 20 qd   Essential hypertension BP Readings from Last 3 Encounters:  01/03/23 126/80  12/28/22 119/80  12/25/22 108/68   Stable, pt to continue medical treatment hct 12.5 every day, losartan 100 qd   Anemia Lab Results  Component Value Date   WBC 7.2 11/08/2022   HGB 10.2 (L) 11/08/2022   HCT 30.6 (L) 11/08/2022   MCV 85.0 11/08/2022   PLT 143 11/08/2022   Stable overall,  to f/u any worsening symptoms or concerns  Followup: Return in about 6 months (around 06/25/2023).  Oliver Barre, MD 01/06/2023 8:14 PM [facility]

## 2023-01-06 ENCOUNTER — Encounter: Payer: Self-pay | Admitting: Internal Medicine

## 2023-01-06 NOTE — Assessment & Plan Note (Signed)
BP Readings from Last 3 Encounters:  01/03/23 126/80  12/28/22 119/80  12/25/22 108/68   Stable, pt to continue medical treatment hct 12.5 every day, losartan 100 qd

## 2023-01-06 NOTE — Assessment & Plan Note (Signed)
Last vitamin D Lab Results  Component Value Date   VD25OH 22.14 (L) 06/15/2022   Low, to start oral replacement

## 2023-01-06 NOTE — Assessment & Plan Note (Signed)
Lab Results  Component Value Date   HGBA1C 5.4 06/15/2022   Stable, pt to continue current medical treatment  - diet, wt control

## 2023-01-06 NOTE — Assessment & Plan Note (Signed)
Lab Results  Component Value Date   WBC 7.2 11/08/2022   HGB 10.2 (L) 11/08/2022   HCT 30.6 (L) 11/08/2022   MCV 85.0 11/08/2022   PLT 143 11/08/2022  Stable overall,  to f/u any worsening symptoms or concerns

## 2023-01-06 NOTE — Assessment & Plan Note (Signed)
Pt due for f/u - for referral heme

## 2023-01-06 NOTE — Assessment & Plan Note (Signed)
Lab Results  Component Value Date   LDLCALC 83 06/15/2022   Stable, pt to continue current statin lipitor 20 qd

## 2023-01-08 ENCOUNTER — Encounter: Payer: Self-pay | Admitting: Physical Medicine & Rehabilitation

## 2023-01-09 ENCOUNTER — Ambulatory Visit: Payer: Medicare HMO | Attending: Physician Assistant | Admitting: Physician Assistant

## 2023-01-09 DIAGNOSIS — M1711 Unilateral primary osteoarthritis, right knee: Secondary | ICD-10-CM

## 2023-01-09 MED ORDER — HYALURONAN 30 MG/2ML IX SOSY
30.0000 mg | PREFILLED_SYRINGE | INTRA_ARTICULAR | Status: AC | PRN
Start: 2023-01-09 — End: 2023-01-09
  Administered 2023-01-09: 30 mg via INTRA_ARTICULAR

## 2023-01-09 MED ORDER — LIDOCAINE HCL 1 % IJ SOLN
1.5000 mL | INTRAMUSCULAR | Status: AC | PRN
Start: 2023-01-09 — End: 2023-01-09
  Administered 2023-01-09: 1.5 mL

## 2023-01-09 NOTE — Progress Notes (Signed)
Procedure Note  Patient: Pamela [name]             Date of Birth: Sep 09, 1955           MRN: [id number]             Visit Date: 01/09/2023  Procedures: Visit Diagnoses:  1. Primary osteoarthritis of right knee    Orthovisc #3 right knee, B/B Large Joint Inj: R knee on 01/09/2023 1:58 PM Indications: pain Details: 25 G 1.5 in needle, medial approach  Arthrogram: No  Medications: 1.5 mL lidocaine 1 %; 30 mg Hyaluronan 30 MG/2ML Aspirate: 0 mL Outcome: tolerated well, no immediate complications Procedure, treatment alternatives, risks and benefits explained, specific risks discussed. Consent was given by the patient. Immediately prior to procedure a time out was called to verify the correct patient, procedure, equipment, support staff and site/side marked as required. Patient was prepped and draped in the usual sterile fashion.    Patient tolerated the procedure well.  Aftercare was discussed.  Sherron Ales, PA-C

## 2023-01-18 ENCOUNTER — Encounter: Payer: Self-pay | Admitting: Physical Medicine & Rehabilitation

## 2023-01-18 ENCOUNTER — Encounter: Payer: Medicare HMO | Attending: Physical Medicine & Rehabilitation | Admitting: Physical Medicine & Rehabilitation

## 2023-01-18 VITALS — BP 125/84 | HR 94 | Ht 68.0 in | Wt 235.8 lb

## 2023-01-18 DIAGNOSIS — Z5181 Encounter for therapeutic drug level monitoring: Secondary | ICD-10-CM | POA: Insufficient documentation

## 2023-01-18 DIAGNOSIS — Z79891 Long term (current) use of opiate analgesic: Secondary | ICD-10-CM | POA: Diagnosis not present

## 2023-01-18 DIAGNOSIS — G8929 Other chronic pain: Secondary | ICD-10-CM | POA: Diagnosis not present

## 2023-01-18 DIAGNOSIS — M0579 Rheumatoid arthritis with rheumatoid factor of multiple sites without organ or systems involvement: Secondary | ICD-10-CM | POA: Insufficient documentation

## 2023-01-18 DIAGNOSIS — G894 Chronic pain syndrome: Secondary | ICD-10-CM | POA: Diagnosis not present

## 2023-01-18 DIAGNOSIS — M545 Low back pain, unspecified: Secondary | ICD-10-CM | POA: Diagnosis not present

## 2023-01-18 NOTE — Progress Notes (Signed)
Subjective:    Patient ID: AARON BRISON, female    DOB: 02/11/1956, 67 y.o.   MRN: [id number]  HPI   JACK RISTAU is a 67 y.o. year old female  who  has a past medical history of ALLERGIC RHINITIS (12/18/2006), Anxiety, Arthritis, DEPRESSION, SITUATIONAL (03/15/2007), GERD (gastroesophageal reflux disease), Headache, History of kidney stones, HYPERLIPIDEMIA (12/18/2006), Hypertension, HYPERTENSION (12/18/2006), INSOMNIA-SLEEP DISORDER-UNSPEC (08/25/2009), 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) (04/03/2012), Pneumonia, and RENAL CALCULUS, HX OF (12/18/2006).   They are presenting to PM&R clinic as a new patient for pain management evaluation. They were referred by Dr. Oliver Barre for treatment of chronic pain and spinal stenosis pain.  Ms. Mcaloon reports that she developed pain about 2 years ago in her lower back after a fall.  She says she was told in the ER that she had a pinched nerve in her back.  Back pain has worsened gradually since this time.  Pain does not radiate down her legs.  It is worsened with activities and spinal extension.  She also has less severe pain in her left knee.  She had a right TKA but would like to avoid having surgery on her left side.  She is getting Orthovisc injections for her right knee by rheumatology.  She has intermittent pain due to right trochanteric bursitis.  She reports this is currently under control.  She also has occasional pain in her shoulders, history of left tendinopathy followed by rheumatology.  She reports this is currently under control.  She is followed by rheumatology for rheumatoid arthritis and osteoarthritis.   She reports history of depression.  His mood is up-and-down.  No SI or HI.    Red flag symptoms: No red flags for back pain endorsed in Hx or ROS  Medications tried:  Topical medications- voltaren helped a little  Nsaids - Didn't help Tylenol - denies benefit  Opiates  Hydrocodone 10 mg 4 times daily as  needed-reports this is not keeping her pain under control, she says it does ease the pain Tramadol-reports minimal benefit Gabapentin - For nerve pain L foot-started started after surgery/ She had drop foot that improved. Foot pain is controlled by gabapentin TCAs  - Denies  SNRIs  - Denies  Flexeril -does not help much    Other treatments: PT- denies  TENs unit - Denies  Injections- Denies  Surgery reports history of cervical spine surgery x3   Prior UDS results: No results found for: "LABOPIA", "COCAINSCRNUR", "LABBENZ", "AMPHETMU", "THCU", "LABBARB"    Pain Inventory Average Pain 8 Pain Right Now 8 My pain is sharp, stabbing, and aching  In the last 24 hours, has pain interfered with the following? General activity 4 Relation with others 4 Enjoyment of life 2 What TIME of day is your pain at its worst? daytime Sleep (in general) Poor  Pain is worse with: walking and bending Pain improves with: medication Relief from Meds: 5  walk without assistance how many minutes can you walk? 25 ability to climb steps?  yes do you drive?  yes  retired  weakness trouble walking spasms depression anxiety  Any changes since last visit?  no  Any changes since last visit?  no Primary care Oliver Barre MD    Family History  Problem Relation Age of Onset   Stroke Mother    Heart disease Father    Lung cancer Sister    Lung cancer Brother    Healthy Daughter  Healthy Daughter    Healthy Daughter    Colon cancer Neg Hx    Esophageal cancer Neg Hx    Pancreatic cancer Neg Hx    Stomach cancer Neg Hx    Social History   Socioeconomic History   Marital status: Married    Spouse name: Not on file   Number of children: 3   Years of education: Not on file   Highest education level: Not on file  Occupational History   Not on file  Tobacco Use   Smoking status: Former    Current packs/day: 0.00    Average packs/day: 0.3 packs/day for 3.0 years (0.8 ttl pk-yrs)     Types: Cigarettes    Start date: 40    Quit date: 9    Years since quitting: 54.8    Passive exposure: Never   Smokeless tobacco: Never   Tobacco comments:    1970's  Vaping Use   Vaping status: Never Used  Substance and Sexual Activity   Alcohol use: Yes    Comment: occ   Drug use: No   Sexual activity: Yes  Other Topics Concern   Not on file  Social History Narrative   Not on file   Social Determinants of Health   Financial Resource Strain: Low Risk  (08/15/2022)   Overall Financial Resource Strain (CARDIA)    Difficulty of Paying Living Expenses: Not hard at all  Food Insecurity: No Food Insecurity (08/15/2022)   Hunger Vital Sign    Worried About Running Out of Food in the Last Year: Never true    Ran Out of Food in the Last Year: Never true  Transportation Needs: No Transportation Needs (08/15/2022)   PRAPARE - Administrator, Civil Service (Medical): No    Lack of Transportation (Non-Medical): No  Physical Activity: Inactive (08/15/2022)   Exercise Vital Sign    Days of Exercise per Week: 0 days    Minutes of Exercise per Session: 0 min  Stress: No Stress Concern Present (08/15/2022)   [employer] of Occupational Health - Occupational Stress Questionnaire    Feeling of Stress : Not at all  Social Connections: Socially Integrated (08/15/2022)   Social Connection and Isolation Panel [NHANES]    Frequency of Communication with Friends and Family: More than three times a week    Frequency of Social Gatherings with Friends and Family: More than three times a week    Attends Religious Services: More than 4 times per year    Active Member of [employer] or Organizations: Yes    Attends Engineer, structural: More than 4 times per year    Marital Status: Married   Past Surgical History:  Procedure Laterality Date   CERVICAL DISC SURGERY     x 3   CONVERSION TO TOTAL KNEE Left 10/09/2016   Procedure: Conversion left uni compartment arthroplasty to total  knee arthroplasty;  Surgeon: Durene Romans, MD;  Location: [facility];  Service: Orthopedics;  Laterality: Left;  90 mins   KNEE ARTHROSCOPY     partial   TOOTH EXTRACTION  2024   x2   total left knee arthoplasty     10/09/16 Dr. Charlann Boxer   Past Medical History:  Diagnosis Date   ALLERGIC RHINITIS 12/18/2006   Qualifier: Diagnosis of  By: Genelle Gather CMA, [place]     Anxiety    Arthritis    DEPRESSION, SITUATIONAL 03/15/2007   Qualifier: Diagnosis of  By: Nena Jordan  GERD (gastroesophageal reflux disease)    Headache    occasional   History of kidney stones    HYPERLIPIDEMIA 12/18/2006   Qualifier: History of  By: Genelle Gather CMA, [place]     Hypertension    HYPERTENSION 12/18/2006   Qualifier: Diagnosis of  By: Genelle Gather CMA, [place]     INSOMNIA-SLEEP DISORDER-UNSPEC 08/25/2009   Qualifier: Diagnosis of  By: Jonny Ruiz MD, Len Blalock    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) 04/03/2012   Pneumonia    RENAL CALCULUS, HX OF 12/18/2006   Qualifier: Diagnosis of  By: Genelle Gather CMA, [place]     BP 125/84   Pulse 94   Ht 5\' 8"  (1.727 m)   Wt 235 lb 12.8 oz (107 kg)   SpO2 98%   BMI 35.85 kg/m   Opioid Risk Score:   Fall Risk Score:  `1  Depression screen [facility] 2/9     01/18/2023   10:35 AM 01/03/2023    1:51 PM 12/25/2022   11:04 AM 08/17/2022    8:09 AM 06/15/2022   10:31 AM 03/31/2022    2:23 PM 03/23/2022    4:41 PM  Depression screen PHQ 2/9  Decreased Interest 2 0 0 0 0 0 0  Down, Depressed, Hopeless 2 0 0 0 0 0 0  PHQ - 2 Score 4 0 0 0 0 0 0  Altered sleeping 2 0 0    0  Tired, decreased energy 2 0 0    0  Change in appetite 2 0 0    0  Feeling bad or failure about yourself  1 0 0    0  Trouble concentrating 1 0 0    0  Moving slowly or fidgety/restless 2 0 0    0  Suicidal thoughts 0 0 0    0  PHQ-9 Score 14 0 0    0  Difficult doing work/chores  Not difficult at all Somewhat difficult    Not difficult at all     Review of Systems  Constitutional:  Positive for  appetite change.       Poor appetite  HENT: Negative.    Eyes: Negative.   Respiratory: Negative.    Gastrointestinal: Negative.   Endocrine: Negative.   Genitourinary: Negative.   Musculoskeletal:  Positive for back pain and gait problem.       Spasms  Skin: Negative.   Allergic/Immunologic: Negative.   Hematological: Negative.   Psychiatric/Behavioral:  Positive for agitation and dysphoric mood.   All other systems reviewed and are negative.      Objective:   Physical Exam  Gen: no distress, normal appearing HEENT: oral mucosa pink and moist, NCAT Chest: normal effort, normal rate of breathing Abd: soft, non-distended Ext: no edema Psych: pleasant, normal affect Skin: intact Neuro: Alert and awake, follows commands, cranial nerves II through XII grossly intact, normal speech and language RUE: 5/5 Deltoid, 5/5 Biceps, 5/5 Triceps, 5/5 Wrist Ext, 5/5 Grip LUE: 5/5 Deltoid, 5/5 Biceps, 5/5 Triceps, 5/5 Wrist Ext, 5/5 Grip RLE: HF 5/5, KE 5/5, ADF 5/5, APF 5/5 LLE: HF 5/5, KE 5/5, ADF 5/5, APF 5/5 Sensory exam normal for light touch and pain in all 4 limbs. No limb ataxia or cerebellar signs. No abnormal tone appreciated.  No abnormal tone noted Musculoskeletal:  SLR negative bilaterally Pain with spinal extension present Facet loading equivocal Lumbar paraspinal tenderness noted in the lower and upper paraspinal muscles FABER and FADIR negative No significant cervical  paraspinal muscle tenderness No significant right knee tenderness noted Left knee TKA scar No significant tenderness over bilateral greater trochanters Very limited range of motion at the C-spine and L-spine No actively red/inflamed joints noted Minimal wrist or MCP joint tenderness  Xray L spine 05/12/2020 IMPRESSION: 1. No acute findings. 2. Mild multilevel degenerative disc disease with disc space narrowing at L3-4. 3. Facet hypertrophy in the lower lumbar spine. 4. Levoconvex rotatory  scoliosis.       Assessment & Plan:   1) Chronic lower back , pain is primarily axial -Prior x-ray with multilevel DDD and evidence of facet hypertrophy, scoliosis  2) Depression, denies SI or HI -On Celexa  3) Rheumatoid arthritis followed by rheumatology  4) History of MGUS, review of notes indicate due for follow-up and PCP has referred to hematology  Plan -Discussed foods for pain -Hold off on TENS unit for now due to MDS -UDS today, consider different medication next visit.  She reports hydrocodone 10 mg does ease her pain a little but not controlling her pain.   -Discussed goals of pain management, opioid treatment not expected to completely eliminate pain, goal to make it tolerable and improved function -Encouraged her to complete MRI, she says this was limited by cost as she had a co-pay of $100 -Suspect lower back pain may be related to facet joint degeneration, MMB/RFA could be considered -Referral to physical therapy placed for her back pain

## 2023-01-24 ENCOUNTER — Ambulatory Visit: Payer: Medicare HMO | Admitting: Internal Medicine

## 2023-01-24 LAB — TOXASSURE SELECT,+ANTIDEPR,UR

## 2023-01-31 ENCOUNTER — Ambulatory Visit: Payer: Medicare HMO | Admitting: Internal Medicine

## 2023-02-01 ENCOUNTER — Other Ambulatory Visit: Payer: Self-pay | Admitting: Internal Medicine

## 2023-02-01 ENCOUNTER — Other Ambulatory Visit: Payer: Self-pay | Admitting: Physician Assistant

## 2023-02-05 ENCOUNTER — Ambulatory Visit: Payer: Medicare HMO | Attending: Physical Medicine & Rehabilitation

## 2023-02-06 ENCOUNTER — Telehealth: Payer: Self-pay | Admitting: Internal Medicine

## 2023-02-06 NOTE — Telephone Encounter (Signed)
Called to reschedule appointments per room/resource. Left VM for patient to call back to reschedule appointments.

## 2023-02-07 ENCOUNTER — Telehealth: Payer: Self-pay | Admitting: Internal Medicine

## 2023-02-07 NOTE — Telephone Encounter (Signed)
Rescheduled appointment per patients request. Patient is aware of the changes made to her upcoming appointments.

## 2023-02-13 ENCOUNTER — Other Ambulatory Visit: Payer: Self-pay | Admitting: Physician Assistant

## 2023-02-14 ENCOUNTER — Inpatient Hospital Stay: Payer: Medicare HMO | Admitting: Internal Medicine

## 2023-02-14 ENCOUNTER — Inpatient Hospital Stay: Payer: Medicare HMO

## 2023-02-16 ENCOUNTER — Encounter: Payer: Medicare HMO | Attending: Physical Medicine & Rehabilitation | Admitting: Physical Medicine & Rehabilitation

## 2023-02-16 ENCOUNTER — Encounter: Payer: Self-pay | Admitting: Physical Medicine & Rehabilitation

## 2023-02-16 VITALS — BP 137/88 | HR 102 | Ht 68.0 in | Wt 234.6 lb

## 2023-02-16 DIAGNOSIS — M1711 Unilateral primary osteoarthritis, right knee: Secondary | ICD-10-CM

## 2023-02-16 DIAGNOSIS — M545 Low back pain, unspecified: Secondary | ICD-10-CM | POA: Diagnosis present

## 2023-02-16 DIAGNOSIS — G8929 Other chronic pain: Secondary | ICD-10-CM | POA: Diagnosis present

## 2023-02-16 DIAGNOSIS — M0579 Rheumatoid arthritis with rheumatoid factor of multiple sites without organ or systems involvement: Secondary | ICD-10-CM

## 2023-02-16 MED ORDER — OXYCODONE-ACETAMINOPHEN 5-325 MG PO TABS: 1.0000 | ORAL_TABLET | ORAL | 0 refills | Status: DC | PRN

## 2023-02-16 NOTE — Progress Notes (Signed)
Subjective:    Patient ID: Pamela [name], female    DOB: 1955/05/15, 67 y.o.   MRN: [id number]  HPI   Pamela [name] is a 67 y.o. year old female  who  has a past medical history of ALLERGIC RHINITIS (12/18/2006), Anxiety, Arthritis, DEPRESSION, SITUATIONAL (03/15/2007), GERD (gastroesophageal reflux disease), Headache, History of kidney stones, HYPERLIPIDEMIA (12/18/2006), Hypertension, HYPERTENSION (12/18/2006), INSOMNIA-SLEEP DISORDER-UNSPEC (08/25/2009), 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) (04/03/2012), Pneumonia, and RENAL CALCULUS, HX OF (12/18/2006).   They are presenting to PM&R clinic as a new patient for pain management evaluation. They were referred by Dr. Oliver Barre for treatment of chronic pain and spinal stenosis pain.  Pamela [name] reports that she developed pain about 2 years ago in her lower back after a fall.  She says she was told in the ER that she had a pinched nerve in her back.  Back pain has worsened gradually since this time.  Pain does not radiate down her legs.  It is worsened with activities and spinal extension.  She also has less severe pain in her left knee.  She had a right TKA but would like to avoid having surgery on her left side.  (Addendum, pain is worse in R knee where she has OA, hx L TKA) She is getting Orthovisc injections for her right knee by rheumatology.  She has intermittent pain due to right trochanteric bursitis.  She reports this is currently under control.  She also has occasional pain in her shoulders, history of left tendinopathy followed by rheumatology.  She reports this is currently under control.  She is followed by rheumatology for rheumatoid arthritis and osteoarthritis.   She reports history of depression.  His mood is up-and-down.  No SI or HI.    Red flag symptoms: No red flags for back pain endorsed in Hx or ROS  Medications tried:  Topical medications- voltaren helped a little  Nsaids - Didn't help Tylenol -  denies benefit  Opiates  Hydrocodone 10 mg 4 times daily as needed-reports this is not keeping her pain under control, she says it does ease the pain Tramadol-reports minimal benefit Gabapentin - For nerve pain L foot-started started after surgery/ She had drop foot that improved. Foot pain is controlled by gabapentin TCAs  - Denies  SNRIs  - Denies  Flexeril -does not help much    Other treatments: PT- denies  TENs unit - Denies  Injections- Denies  Surgery reports history of cervical spine surgery x3   Prior UDS results: No results found for: "LABOPIA", "COCAINSCRNUR", "LABBENZ", "AMPHETMU", "THCU", "LABBARB"     Interval history 02/16/2023 Pamela [name] reports that her pain has been worse since she ran out of hydrocodone 10 mg.  She she has more trouble doing things around the house and is less active since this time.  She continues to have pain in her lower back.  She is also been having pain in her knees.  She reports history of bilateral knee OA and has a history of left TKA.  She had Orthovisc injections completed by rheumatology for her right knee.  Pain Inventory Average Pain 7 Pain Right Now 7 My pain is aching  In the last 24 hours, has pain interfered with the following? General activity 5 Relation with others 0 Enjoyment of life 1 What TIME of day is your pain at its worst? daytime Sleep (in general) Poor  Pain is worse with: walking and bending Pain improves with: medication  Relief from Meds: 8  walk without assistance how many minutes can you walk? 25 ability to climb steps?  yes do you drive?  yes  retired  weakness trouble walking spasms depression anxiety  Any changes since last visit?  no  Any changes since last visit?  no Primary care Oliver Barre MD    Family History  Problem Relation Age of Onset   Stroke Mother    Heart disease Father    Lung cancer Sister    Lung cancer Brother    Healthy Daughter    Healthy Daughter    Healthy  Daughter    Colon cancer Neg Hx    Esophageal cancer Neg Hx    Pancreatic cancer Neg Hx    Stomach cancer Neg Hx    Social History   Socioeconomic History   Marital status: Married    Spouse name: Not on file   Number of children: 3   Years of education: Not on file   Highest education level: Not on file  Occupational History   Not on file  Tobacco Use   Smoking status: Former    Current packs/day: 0.00    Average packs/day: 0.3 packs/day for 3.0 years (0.8 ttl pk-yrs)    Types: Cigarettes    Start date: 86    Quit date: 1970    Years since quitting: 54.9    Passive exposure: Never   Smokeless tobacco: Never   Tobacco comments:    1970's  Vaping Use   Vaping status: Never Used  Substance and Sexual Activity   Alcohol use: Yes    Comment: occ   Drug use: No   Sexual activity: Yes  Other Topics Concern   Not on file  Social History Narrative   Not on file   Social Determinants of Health   Financial Resource Strain: Low Risk  (08/15/2022)   Overall Financial Resource Strain (CARDIA)    Difficulty of Paying Living Expenses: Not hard at all  Food Insecurity: No Food Insecurity (08/15/2022)   Hunger Vital Sign    Worried About Running Out of Food in the Last Year: Never true    Ran Out of Food in the Last Year: Never true  Transportation Needs: No Transportation Needs (08/15/2022)   PRAPARE - Administrator, Civil Service (Medical): No    Lack of Transportation (Non-Medical): No  Physical Activity: Inactive (08/15/2022)   Exercise Vital Sign    Days of Exercise per Week: 0 days    Minutes of Exercise per Session: 0 min  Stress: No Stress Concern Present (08/15/2022)   [employer] of Occupational Health - Occupational Stress Questionnaire    Feeling of Stress : Not at all  Social Connections: Socially Integrated (08/15/2022)   Social Connection and Isolation Panel [NHANES]    Frequency of Communication with Friends and Family: More than three times a week     Frequency of Social Gatherings with Friends and Family: More than three times a week    Attends Religious Services: More than 4 times per year    Active Member of [employer] or Organizations: Yes    Attends Engineer, structural: More than 4 times per year    Marital Status: Married   Past Surgical History:  Procedure Laterality Date   CERVICAL DISC SURGERY     x 3   CONVERSION TO TOTAL KNEE Left 10/09/2016   Procedure: Conversion left uni compartment arthroplasty to total knee arthroplasty;  Surgeon: Charlann Boxer,  Molli Hazard, MD;  Location: [facility];  Service: Orthopedics;  Laterality: Left;  90 mins   KNEE ARTHROSCOPY     partial   TOOTH EXTRACTION  2024   x2   total left knee arthoplasty     10/09/16 Dr. Charlann Boxer   Past Medical History:  Diagnosis Date   ALLERGIC RHINITIS 12/18/2006   Qualifier: Diagnosis of  By: Genelle Gather CMA, [place]     Anxiety    Arthritis    DEPRESSION, SITUATIONAL 03/15/2007   Qualifier: Diagnosis of  By: Nena Jordan    GERD (gastroesophageal reflux disease)    Headache    occasional   History of kidney stones    HYPERLIPIDEMIA 12/18/2006   Qualifier: History of  By: Genelle Gather CMA, [place]     Hypertension    HYPERTENSION 12/18/2006   Qualifier: Diagnosis of  By: Genelle Gather CMA, [place]     INSOMNIA-SLEEP DISORDER-UNSPEC 08/25/2009   Qualifier: Diagnosis of  By: Jonny Ruiz MD, Len Blalock    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) 04/03/2012   Pneumonia    RENAL CALCULUS, HX OF 12/18/2006   Qualifier: Diagnosis of  By: Genelle Gather CMA, [place]     BP 137/88   Pulse (!) 102   Ht 5\' 8"  (1.727 m)   Wt 234 lb 9.6 oz (106.4 kg)   SpO2 96%   BMI 35.67 kg/m   Opioid Risk Score:   Fall Risk Score:  `1  Depression screen [facility] 2/9     02/16/2023   10:01 AM 01/18/2023   10:35 AM 01/03/2023    1:51 PM 12/25/2022   11:04 AM 08/17/2022    8:09 AM 06/15/2022   10:31 AM 03/31/2022    2:23 PM  Depression screen PHQ 2/9  Decreased Interest 3 2 0 0 0 0 0  Down,  Depressed, Hopeless 3 2 0 0 0 0 0  PHQ - 2 Score 6 4 0 0 0 0 0  Altered sleeping  2 0 0     Tired, decreased energy  2 0 0     Change in appetite  2 0 0     Feeling bad or failure about yourself   1 0 0     Trouble concentrating  1 0 0     Moving slowly or fidgety/restless  2 0 0     Suicidal thoughts  0 0 0     PHQ-9 Score  14 0 0     Difficult doing work/chores   Not difficult at all Somewhat difficult        Review of Systems  Constitutional:  Positive for appetite change.       Poor appetite  HENT: Negative.    Eyes: Negative.   Respiratory: Negative.    Gastrointestinal: Negative.   Endocrine: Negative.   Genitourinary: Negative.   Musculoskeletal:  Positive for back pain and gait problem.       Spasms  Skin: Negative.   Allergic/Immunologic: Negative.   Hematological: Negative.   Psychiatric/Behavioral:  Positive for agitation and dysphoric mood.   All other systems reviewed and are negative.      Objective:   Physical Exam  Gen: no distress, normal appearing HEENT: oral mucosa pink and moist, NCAT Chest: normal effort, normal rate of breathing Abd: soft, non-distended Ext: no edema Psych: pleasant, normal affect Skin: intact Neuro: Alert and awake, follows commands, cranial nerves II through XII grossly intact, normal speech and language RUE: 5/5 Deltoid, 5/5 Biceps,  5/5 Triceps, 5/5 Wrist Ext, 5/5 Grip LUE: 5/5 Deltoid, 5/5 Biceps, 5/5 Triceps, 5/5 Wrist Ext, 5/5 Grip RLE: HF 5/5, KE 5/5, ADF 5/5, APF 5/5 LLE: HF 5/5, KE 5/5, ADF 5/5, APF 5/5 Sensory exam normal for light touch and pain in all 4 limbs.   Musculoskeletal:  Slump test negative bilaterally Pain with spinal extension present Facet loading negative today Lumbar paraspinal tenderness noted in the lower and upper paraspinal muscles FABER and FADIR negative No significant cervical paraspinal muscle tenderness Mild tenderness joint line right knee Left knee TKA scar Decreased range of motion  at C-spine and L-spine No actively red/inflamed joints noted   Xray L spine 05/12/2020 IMPRESSION: 1. No acute findings. 2. Mild multilevel degenerative disc disease with disc space narrowing at L3-4. 3. Facet hypertrophy in the lower lumbar spine. 4. Levoconvex rotatory scoliosis.       Assessment & Plan:   1) Chronic lower back , pain is primarily axial -Prior x-ray with multilevel DDD and evidence of facet hypertrophy, scoliosis  2) Depression, denies SI or HI -On Celexa 40 mg daily  3) Rheumatoid arthritis followed by rheumatology  4) History of MGUS, review of notes indicate due for follow-up and PCP has referred to hematology  5) R knee OA status, post left TKA  Plan -Discussed foods for pain -Hold off on TENS unit for now due to MDS -Monitor UDS/PDMP/Pill counts -Discussed goals of pain management, opioid treatment not expected to completely eliminate pain, goal to make it tolerable and improved function -Encouraged her to complete MRI, she says this was limited by cost as she had a co-pay of $100 -UDS consistent overall other than she did have some alcohol metabolites noted-she denies drinking regularly.  We discussed discontinuing alcohol use as there is increased risk now that she is using pain medication, she agrees. -Suspect lower back pain may be related to facet joint degeneration, MMB/RFA could be considered -Referral to physical therapy placed for her back pain-pending -Order Percocet 5 every 6 hours as needed.  #120 Medication initially called in at every 4 hours as needed on accident.  Called patient today and discussed we will start this at 1 tablet up to 4 times a day for now and this should be 1 month of treatment.  Patient agrees

## 2023-03-08 ENCOUNTER — Telehealth: Payer: Self-pay | Admitting: Hematology and Oncology

## 2023-03-08 NOTE — Telephone Encounter (Signed)
Rescheduled appointments per patients request. Patient is aware of the changes made to her upcoming appointments.

## 2023-03-09 ENCOUNTER — Emergency Department (HOSPITAL_COMMUNITY)
Admission: EM | Admit: 2023-03-09 | Discharge: 2023-03-10 | Disposition: A | Discharge status: Home / Self Care | Payer: Medicare HMO | Attending: Emergency Medicine | Admitting: Emergency Medicine

## 2023-03-09 ENCOUNTER — Inpatient Hospital Stay: Payer: Medicare HMO

## 2023-03-09 ENCOUNTER — Inpatient Hospital Stay: Payer: Medicare HMO | Admitting: Oncology

## 2023-03-09 ENCOUNTER — Emergency Department (HOSPITAL_COMMUNITY): Payer: Medicare HMO

## 2023-03-09 DIAGNOSIS — I10 Essential (primary) hypertension: Secondary | ICD-10-CM | POA: Diagnosis not present

## 2023-03-09 DIAGNOSIS — R000 Tachycardia, unspecified: Secondary | ICD-10-CM | POA: Diagnosis not present

## 2023-03-09 DIAGNOSIS — I213 ST elevation (STEMI) myocardial infarction of unspecified site: Secondary | ICD-10-CM | POA: Diagnosis not present

## 2023-03-09 DIAGNOSIS — R1013 Epigastric pain: Secondary | ICD-10-CM | POA: Insufficient documentation

## 2023-03-09 DIAGNOSIS — R110 Nausea: Secondary | ICD-10-CM | POA: Diagnosis not present

## 2023-03-09 DIAGNOSIS — R1084 Generalized abdominal pain: Secondary | ICD-10-CM | POA: Diagnosis not present

## 2023-03-09 DIAGNOSIS — R109 Unspecified abdominal pain: Secondary | ICD-10-CM | POA: Diagnosis not present

## 2023-03-09 DIAGNOSIS — N281 Cyst of kidney, acquired: Secondary | ICD-10-CM | POA: Diagnosis not present

## 2023-03-09 LAB — COMPREHENSIVE METABOLIC PANEL
ALT: 34 U/L (ref 0–44)
AST: 45 U/L — ABNORMAL HIGH (ref 15–41)
Albumin: 4 g/dL (ref 3.5–5.0)
Alkaline Phosphatase: 68 U/L (ref 38–126)
Anion gap: 18 — ABNORMAL HIGH (ref 5–15)
BUN: 15 mg/dL (ref 8–23)
CO2: 22 mmol/L (ref 22–32)
Calcium: 10.4 mg/dL — ABNORMAL HIGH (ref 8.9–10.3)
Chloride: 100 mmol/L (ref 98–111)
Creatinine, Ser: 1.28 mg/dL — ABNORMAL HIGH (ref 0.44–1.00)
GFR, Estimated: 46 mL/min — ABNORMAL LOW (ref >60)
Glucose, Bld: 177 mg/dL — ABNORMAL HIGH (ref 70–99)
Potassium: 3.5 mmol/L (ref 3.5–5.1)
Sodium: 140 mmol/L (ref 135–145)
Total Bilirubin: 0.8 mg/dL (ref <1.2)
Total Protein: 7.8 g/dL (ref 6.5–8.1)

## 2023-03-09 LAB — URINALYSIS, ROUTINE W REFLEX MICROSCOPIC
Bacteria, UA: NONE SEEN
Bilirubin Urine: NEGATIVE
Glucose, UA: NEGATIVE mg/dL
Hgb urine dipstick: NEGATIVE
Ketones, ur: 5 mg/dL — AB
Leukocytes,Ua: NEGATIVE
Nitrite: NEGATIVE
Protein, ur: >=300 mg/dL — AB
Specific Gravity, Urine: 1.012 (ref 1.005–1.030)
pH: 8 (ref 5.0–8.0)

## 2023-03-09 LAB — I-STAT CHEM 8, ED
BUN: 15 mg/dL (ref 8–23)
Calcium, Ion: 1.11 mmol/L — ABNORMAL LOW (ref 1.15–1.40)
Chloride: 103 mmol/L (ref 98–111)
Creatinine, Ser: 1 mg/dL (ref 0.44–1.00)
Glucose, Bld: 173 mg/dL — ABNORMAL HIGH (ref 70–99)
HCT: 43 % (ref 36.0–46.0)
Hemoglobin: 14.6 g/dL (ref 12.0–15.0)
Potassium: 3 mmol/L — ABNORMAL LOW (ref 3.5–5.1)
Sodium: 140 mmol/L (ref 135–145)
TCO2: 24 mmol/L (ref 22–32)

## 2023-03-09 LAB — CBC
HCT: 40.5 % (ref 36.0–46.0)
Hemoglobin: 13.8 g/dL (ref 12.0–15.0)
MCH: 27.6 pg (ref 26.0–34.0)
MCHC: 34.1 g/dL (ref 30.0–36.0)
MCV: 81 fL (ref 80.0–100.0)
Platelets: 270 10*3/uL (ref 150–400)
RBC: 5 MIL/uL (ref 3.87–5.11)
RDW: 12.1 % (ref 11.5–15.5)
WBC: 12.7 10*3/uL — ABNORMAL HIGH (ref 4.0–10.5)
nRBC: 0 % (ref 0.0–0.2)

## 2023-03-09 LAB — LIPASE, BLOOD: Lipase: 38 U/L (ref 11–51)

## 2023-03-09 MED ORDER — IOHEXOL 350 MG/ML SOLN
75.0000 mL | Freq: Once | INTRAVENOUS | Status: AC | PRN
Start: 2023-03-09 — End: 2023-03-09
  Administered 2023-03-09: 75 mL via INTRAVENOUS

## 2023-03-09 NOTE — ED Provider Triage Note (Signed)
Emergency Medicine Provider Triage Evaluation Note  Pamela [name] , a 66 y.o. female  was evaluated in triage.  Pt complains of abdominal pain.  Review of Systems  Positive:  Negative:   Physical Exam  BP (!) 165/139   Pulse (!) 112   Temp 98.1 F (36.7 C) (Oral)   Resp (!) 25   SpO2 98%  Gen:   Awake, no distress   Resp:  Normal effort  MSK:   Moves extremities without difficulty  Other:    Medical Decision Making  Medically screening exam initiated at 6:03 PM.  Appropriate orders placed.  COPPER WITKIN was informed that the remainder of the evaluation will be completed by another provider, this initial triage assessment does not replace that evaluation, and the importance of remaining in the ED until their evaluation is complete.  Epigastric pain since this morning. Nauseous. No vomiting or diarrhea.  Last BM yesterday. Also coughing and chills.   Denies fever, chest pain, dyspnea, dysuria, hematuria, hematochezia.    Dorthy Cooler, [place] 03/09/23 1805

## 2023-03-09 NOTE — ED Triage Notes (Signed)
Pt BIB EMS from home with complaintss of epigastric pain started this morning. Nausea without vomiting or diarrhea.   EMS Vitals  BP190/100 HR 110 O2  98% CBTG 186

## 2023-03-10 MED ORDER — MORPHINE SULFATE (PF) 2 MG/ML IV SOLN
2.0000 mg | Freq: Once | INTRAVENOUS | Status: AC
  Administered 2023-03-10: 2 mg via INTRAVENOUS
  Filled 2023-03-10: qty 1

## 2023-03-10 MED ORDER — ONDANSETRON HCL 4 MG/2ML IJ SOLN
4.0000 mg | Freq: Once | INTRAMUSCULAR | Status: AC
  Administered 2023-03-10: 4 mg via INTRAVENOUS
  Filled 2023-03-10: qty 2

## 2023-03-10 MED ORDER — ALUM & MAG HYDROXIDE-SIMETH 200-200-20 MG/5ML PO SUSP
30.0000 mL | Freq: Once | ORAL | Status: AC
  Administered 2023-03-10: 30 mL via ORAL
  Filled 2023-03-10: qty 30

## 2023-03-10 MED ORDER — SODIUM CHLORIDE 0.9 % IV BOLUS
1000.0000 mL | Freq: Once | INTRAVENOUS | Status: AC
  Administered 2023-03-10: 1000 mL via INTRAVENOUS

## 2023-03-10 NOTE — ED Provider Notes (Signed)
EMERGENCY DEPARTMENT AT [facility] Provider Note   CSN: [id number] Arrival date & time: 03/09/23  1745     History  Chief Complaint  Patient presents with   Abdominal Pain    Pamela [name] is a 67 y.o. female.  67 yo F with a chief complaints of abdominal pain.  This is epigastric going on since yesterday.  Has had some gagging but denies any vomiting.  No diarrhea.  Having some low-grade fevers at home as well.  No known sick contacts no suspicious food intake.   Abdominal Pain      Home Medications Prior to Admission medications   Medication Sig Start Date End Date Taking? Authorizing Provider  adalimumab (HUMIRA, 2 PEN,) 40 MG/0.4ML pen Inject 0.4 mLs (40 mg total) into the skin every 14 (fourteen) days. 12/28/22   Pollyann Savoy, MD  ALPRAZolam Prudy Feeler) 0.25 MG tablet TAKE 1 TABLET BY MOUTH TWICE A DAY AS NEEDED 02/01/23   Corwin Levins, MD  atorvastatin (LIPITOR) 20 MG tablet TAKE 1 TABLET BY MOUTH EVERY DAY 02/01/23   Corwin Levins, MD  citalopram (CELEXA) 40 MG tablet TAKE 1 TABLET BY MOUTH EVERY DAY 09/25/22   Corwin Levins, MD  cyclobenzaprine (FLEXERIL) 5 MG tablet TAKE 1 TABLET BY MOUTH THREE TIMES A DAY AS NEEDED FOR MUSCLE SPASM 12/11/22   Corwin Levins, MD  diclofenac Sodium (VOLTAREN ARTHRITIS PAIN) 1 % GEL Apply topically daily.    [provider]  gabapentin (NEURONTIN) 600 MG tablet Take 1 tablet (600 mg total) by mouth 3 (three) times daily. 12/25/22   Corwin Levins, MD  hydrochlorothiazide (MICROZIDE) 12.5 MG capsule TAKE 1 CAPSULE(12.5 MG) BY MOUTH DAILY 02/01/23   Corwin Levins, MD  losartan (COZAAR) 100 MG tablet TAKE 1 TABLET BY MOUTH EVERY DAY 02/01/23   Corwin Levins, MD  methotrexate (RHEUMATREX) 2.5 MG tablet TAKE 4 TABLETS (10 MG TOTAL) BY MOUTH ONCE A WEEK 11/20/22   Gearldine Bienenstock, PA-C  metoCLOPramide (REGLAN) 10 MG tablet TAKE 1 TABLET BY MOUTH TWICE A DAY IN THE MORNING AND IN THE EVENING 02/15/23   Unk Lightning, PA  Multiple Vitamin (MULTIVITAMIN WITH MINERALS) TABS tablet Take 1 tablet by mouth daily.    [provider]  oxyCODONE-acetaminophen (PERCOCET) 5-325 MG tablet Take 1 tablet by mouth every 4 (four) hours as needed for severe pain (pain score 7-10). 02/16/23   Fanny Dance, MD  pantoprazole (PROTONIX) 40 MG tablet TAKE 1 TABLET BY MOUTH TWICE A DAY 09/05/22   Unk Lightning, PA  traZODone (DESYREL) 50 MG tablet TAKE 1/2 TO 1 TABLET BY MOUTH AT BEDTIME AS NEEDED FOR SLEEP 12/11/22   Corwin Levins, MD  zolpidem (AMBIEN) 10 MG tablet TAKE 1 TABLET BY MOUTH EVERY DAY AT BEDTIME AS NEEDED 09/25/22   Corwin Levins, MD      Allergies    Cymbalta [duloxetine hcl]    Review of Systems   Review of Systems  Gastrointestinal:  Positive for abdominal pain.    Physical Exam Updated Vital Signs BP 121/82 (BP Location: Left Arm)   Pulse (!) 104   Temp 99 F (37.2 C)   Resp 17   SpO2 93%  Physical Exam Vitals and nursing note reviewed.  Constitutional:      General: She is not in acute distress.    Appearance: She is well-developed. She is not diaphoretic.  HENT:     Head:  Normocephalic and atraumatic.  Eyes:     Pupils: Pupils are equal, round, and reactive to light.  Cardiovascular:     Rate and Rhythm: Normal rate and regular rhythm.     Heart sounds: No murmur heard.    No friction rub. No gallop.  Pulmonary:     Effort: Pulmonary effort is normal.     Breath sounds: No wheezing or rales.  Abdominal:     General: There is no distension.     Palpations: Abdomen is soft.     Tenderness: There is abdominal tenderness.     Comments: Mild epigastric tenderness.  No obvious pain in the right upper quadrant.  Negative Murphy sign.  Musculoskeletal:        General: No tenderness.     Cervical back: Normal range of motion and neck supple.  Skin:    General: Skin is warm and dry.  Neurological:     Mental Status: She is alert and oriented to person, place, and  time.  Psychiatric:        Behavior: Behavior normal.     ED Results / Procedures / Treatments   Labs (all labs ordered are listed, but only abnormal results are displayed) Labs Reviewed  COMPREHENSIVE METABOLIC PANEL - Abnormal; Notable for the following components:      Result Value   Glucose, Bld 177 (*)    Creatinine, Ser 1.28 (*)    Calcium 10.4 (*)    AST 45 (*)    GFR, Estimated 46 (*)    Anion gap 18 (*)    All other components within normal limits  CBC - Abnormal; Notable for the following components:   WBC 12.7 (*)    All other components within normal limits  URINALYSIS, ROUTINE W REFLEX MICROSCOPIC - Abnormal; Notable for the following components:   Ketones, ur 5 (*)    Protein, ur >=300 (*)    All other components within normal limits  I-STAT CHEM 8, ED - Abnormal; Notable for the following components:   Potassium 3.0 (*)    Glucose, Bld 173 (*)    Calcium, Ion 1.11 (*)    All other components within normal limits  LIPASE, BLOOD    EKG EKG Interpretation Date/Time:  Friday March 09 2023 18:07:37 EST Ventricular Rate:  111 PR Interval:  136 QRS Duration:  70 QT Interval:  372 QTC Calculation: 505 R Axis:   56  Text Interpretation: Sinus tachycardia Cannot rule out Anterior infarct , age undetermined Abnormal ECG Since last tracing rate faster Otherwise no significant change Confirmed by Melene Plan [phone]) on 03/10/2023 8:13:52 AM  Radiology CT ABDOMEN PELVIS W CONTRAST Result Date: 03/09/2023 CLINICAL DATA:  Acute abdominal pain with nausea, initial encounter EXAM: CT ABDOMEN AND PELVIS WITH CONTRAST TECHNIQUE: Multidetector CT imaging of the abdomen and pelvis was performed using the standard protocol following bolus administration of intravenous contrast. RADIATION DOSE REDUCTION: This exam was performed according to the departmental dose-optimization program which includes automated exposure control, adjustment of the mA and/or kV according to  patient size and/or use of iterative reconstruction technique. CONTRAST:  75mL OMNIPAQUE IOHEXOL 350 MG/ML SOLN COMPARISON:  09/22/2015 FINDINGS: Lower chest: Mild patchy scarring is noted in the bases bilaterally. Hepatobiliary: No focal liver abnormality is seen. No gallstones, gallbladder wall thickening, or biliary dilatation. Pancreas: Unremarkable. No pancreatic ductal dilatation or surrounding inflammatory changes. Spleen: Normal in size without focal abnormality. Adrenals/Urinary Tract: Adrenal glands are within normal limits. Kidneys demonstrate a normal  enhancement pattern bilaterally. Scattered small renal cysts are seen bilaterally stable from the prior exam. Extensive scarring in right kidney is noted as well. No perfusion defects are seen. No obstructive changes are noted. The bladder is decompressed. Stomach/Bowel: No obstructive or inflammatory changes of the colon are seen. Few scattered diverticula are noted without inflammatory change. The appendix is within normal limits. Small bowel is unremarkable. Hyperemia is noted in the gastric mucosa suspicious for gastritis. No evidence of ulcer crater or perforation is identified at this time. Some very mild perigastric inflammatory changes are noted. Vascular/Lymphatic: Aortic atherosclerosis. No enlarged abdominal or pelvic lymph nodes. Reproductive: Uterus and bilateral adnexa are unremarkable. Other: No abdominal wall hernia or abnormality. No abdominopelvic ascites. Musculoskeletal: No acute or significant osseous findings. IMPRESSION: Changes consistent with diffuse gastritis.  No ulcer crater is seen. Chronic changes as previously described. Electronically Signed   By: Alcide Clever M.D.   On: 03/09/2023 20:13    Procedures Procedures    Medications Ordered in ED Medications  iohexol (OMNIPAQUE) 350 MG/ML injection 75 mL (75 mLs Intravenous Contrast Given 03/09/23 2006)  sodium chloride 0.9 % bolus 1,000 mL (1,000 mLs Intravenous New  Bag/Given 03/10/23 0902)  alum & mag hydroxide-simeth (MAALOX/MYLANTA) 200-200-20 MG/5ML suspension 30 mL (30 mLs Oral Given 03/10/23 0903)  ondansetron (ZOFRAN) injection 4 mg (4 mg Intravenous Given 03/10/23 0903)  morphine (PF) 2 MG/ML injection 2 mg (2 mg Intravenous Given 03/10/23 9528)    ED Course/ Medical Decision Making/ A&P                                 Medical Decision Making Amount and/or Complexity of Data Reviewed Labs: ordered.  Risk OTC drugs. Prescription drug management.   67 yo F with a chief complaints of upper abdominal pain and nausea going on since yesterday.  Patient was seen in the MSE process and unfortunately had to wait 15 hours prior to being seen.  She had blood work consistent with acute kidney injury, no significant electrolyte abnormalities.  LFTs and lipase were unremarkable.  She had CT imaging that showed gastritis.  She has a history of gastroparesis of unknown etiology.  Also has a history of reflux and is on Protonix.  Will give a bolus of IV fluids here.  Treat pain and nausea.  GI cocktail.  Reassess.  Patient feeling much better after symptomatic therapy here.  Tolerating by mouth without issue.  Again discussed risk and benefits of admission versus outpatient therapy she would like to go home at this time.  Plans to follow-up with her family doctor in the office.   10:31 AM:  I have discussed the diagnosis/risks/treatment options with the patient and family.  Evaluation and diagnostic testing in the emergency department does not suggest an emergent condition requiring admission or immediate intervention beyond what has been performed at this time.  They will follow up with PCP. We also discussed returning to the ED immediately if new or worsening sx occur. We discussed the sx which are most concerning (e.g., sudden worsening pain, fever, inability to tolerate by mouth) that necessitate immediate return. Medications administered to the patient  during their visit and any new prescriptions provided to the patient are listed below.  Medications given during this visit Medications  iohexol (OMNIPAQUE) 350 MG/ML injection 75 mL (75 mLs Intravenous Contrast Given 03/09/23 2006)  sodium chloride 0.9 % bolus 1,000 mL (1,000 mLs Intravenous  New Bag/Given 03/10/23 0902)  alum & mag hydroxide-simeth (MAALOX/MYLANTA) 200-200-20 MG/5ML suspension 30 mL (30 mLs Oral Given 03/10/23 0903)  ondansetron (ZOFRAN) injection 4 mg (4 mg Intravenous Given 03/10/23 0903)  morphine (PF) 2 MG/ML injection 2 mg (2 mg Intravenous Given 03/10/23 5621)     The patient appears reasonably screen and/or stabilized for discharge and I doubt any other medical condition or other [facility] requiring further screening, evaluation, or treatment in the ED at this time prior to discharge.          Final Clinical Impression(s) / ED Diagnoses Final diagnoses:  Epigastric pain    Rx / DC Orders ED Discharge Orders     None         Melene Plan, DO 03/10/23 1031

## 2023-03-10 NOTE — Discharge Instructions (Signed)
Your kidney function was a bit worse than your normal.  Try to increase your fluid intake at home.  Discussed this with your family doctor they may want to recheck labs in a week or two to make sure it is improving.   Try to avoid things that may make this worse, most commonly these are spicy foods tomato based products fatty foods chocolate and peppermint.  Alcohol and tobacco can also make this worse.  Return to the emergency department for sudden worsening pain fever or inability to eat or drink.

## 2023-03-11 ENCOUNTER — Other Ambulatory Visit: Payer: Self-pay | Admitting: Internal Medicine

## 2023-03-12 ENCOUNTER — Other Ambulatory Visit: Payer: Self-pay | Admitting: Internal Medicine

## 2023-03-12 ENCOUNTER — Other Ambulatory Visit: Payer: Self-pay

## 2023-03-14 ENCOUNTER — Other Ambulatory Visit: Payer: Self-pay | Admitting: Internal Medicine

## 2023-03-15 ENCOUNTER — Other Ambulatory Visit: Payer: Self-pay | Admitting: *Deleted

## 2023-03-15 MED ORDER — METHOTREXATE SODIUM 2.5 MG PO TABS: 10.0000 mg | ORAL_TABLET | ORAL | 0 refills | Status: DC

## 2023-03-15 NOTE — Telephone Encounter (Signed)
 Refill request received via fax from [facility] [address]  for Methotrexate   Last Fill: 11/20/2022  Labs: 03/09/2023 WBC 12.7, Glucose 177, Calcium  10.4, AST 45, Anion gap 18  Next Visit: 05/31/2023  Last Visit: 12/28/2022  DX: Rheumatoid arthritis involving multiple sites with positive rheumatoid factor   Current Dose per office note 12/28/2022: Methotrexate  4 tablets by mouth once weekly   Okay to refill Methotrexate ?

## 2023-03-16 ENCOUNTER — Other Ambulatory Visit (HOSPITAL_COMMUNITY): Payer: Self-pay

## 2023-03-21 ENCOUNTER — Ambulatory Visit: Payer: Medicare HMO | Admitting: Internal Medicine

## 2023-03-22 ENCOUNTER — Emergency Department (HOSPITAL_BASED_OUTPATIENT_CLINIC_OR_DEPARTMENT_OTHER): Payer: Medicare HMO

## 2023-03-22 ENCOUNTER — Emergency Department (HOSPITAL_BASED_OUTPATIENT_CLINIC_OR_DEPARTMENT_OTHER)
Admission: EM | Admit: 2023-03-22 | Discharge: 2023-03-22 | Disposition: A | Discharge status: Home / Self Care | Payer: Medicare HMO | Attending: Emergency Medicine | Admitting: Emergency Medicine

## 2023-03-22 ENCOUNTER — Emergency Department
Admission: EM | Admit: 2023-03-22 | Discharge: 2023-03-22 | Discharge status: Against Medical Advice | Payer: Medicare HMO | Attending: Student | Admitting: Student

## 2023-03-22 ENCOUNTER — Emergency Department: Payer: Medicare HMO

## 2023-03-22 ENCOUNTER — Other Ambulatory Visit: Payer: Self-pay

## 2023-03-22 ENCOUNTER — Encounter (HOSPITAL_BASED_OUTPATIENT_CLINIC_OR_DEPARTMENT_OTHER): Payer: Self-pay | Admitting: Emergency Medicine

## 2023-03-22 DIAGNOSIS — Z5321 Procedure and treatment not carried out due to patient leaving prior to being seen by health care provider: Secondary | ICD-10-CM | POA: Insufficient documentation

## 2023-03-22 DIAGNOSIS — W109XXA Fall (on) (from) unspecified stairs and steps, initial encounter: Secondary | ICD-10-CM | POA: Insufficient documentation

## 2023-03-22 DIAGNOSIS — Z96642 Presence of left artificial hip joint: Secondary | ICD-10-CM | POA: Diagnosis not present

## 2023-03-22 DIAGNOSIS — S0012XA Contusion of left eyelid and periocular area, initial encounter: Secondary | ICD-10-CM | POA: Insufficient documentation

## 2023-03-22 DIAGNOSIS — R5383 Other fatigue: Secondary | ICD-10-CM | POA: Diagnosis not present

## 2023-03-22 DIAGNOSIS — S01511A Laceration without foreign body of lip, initial encounter: Secondary | ICD-10-CM | POA: Diagnosis not present

## 2023-03-22 DIAGNOSIS — W108XXA Fall (on) (from) other stairs and steps, initial encounter: Secondary | ICD-10-CM | POA: Insufficient documentation

## 2023-03-22 DIAGNOSIS — M25562 Pain in left knee: Secondary | ICD-10-CM | POA: Diagnosis not present

## 2023-03-22 DIAGNOSIS — S0083XA Contusion of other part of head, initial encounter: Secondary | ICD-10-CM | POA: Diagnosis not present

## 2023-03-22 DIAGNOSIS — Z981 Arthrodesis status: Secondary | ICD-10-CM | POA: Diagnosis not present

## 2023-03-22 DIAGNOSIS — S0990XA Unspecified injury of head, initial encounter: Secondary | ICD-10-CM | POA: Insufficient documentation

## 2023-03-22 DIAGNOSIS — Z96652 Presence of left artificial knee joint: Secondary | ICD-10-CM | POA: Diagnosis not present

## 2023-03-22 DIAGNOSIS — S060X0A Concussion without loss of consciousness, initial encounter: Secondary | ICD-10-CM

## 2023-03-22 DIAGNOSIS — S0003XA Contusion of scalp, initial encounter: Secondary | ICD-10-CM | POA: Diagnosis not present

## 2023-03-22 DIAGNOSIS — Y9301 Activity, walking, marching and hiking: Secondary | ICD-10-CM | POA: Diagnosis not present

## 2023-03-22 DIAGNOSIS — M542 Cervicalgia: Secondary | ICD-10-CM | POA: Insufficient documentation

## 2023-03-22 DIAGNOSIS — S199XXA Unspecified injury of neck, initial encounter: Secondary | ICD-10-CM | POA: Diagnosis not present

## 2023-03-22 DIAGNOSIS — Z471 Aftercare following joint replacement surgery: Secondary | ICD-10-CM | POA: Diagnosis not present

## 2023-03-22 MED ORDER — OXYCODONE-ACETAMINOPHEN 5-325 MG PO TABS
1.0000 | ORAL_TABLET | Freq: Once | ORAL | Status: AC
  Administered 2023-03-22: 1 via ORAL
  Filled 2023-03-22: qty 1

## 2023-03-22 NOTE — ED Triage Notes (Signed)
 Pt missed a step going into her home, fell forward hitting a wood piece of furniture with her left side face,forehead and her nose. She has a sizable lump on her forehead (middle), left eye is swollen and bruised. Pt states she has a cut under her top  lip, not visible to nurse. No LOC, pt does have a headache. Pt has hx of left knee replacement and she did go down on that knee and has discomfort when walking.

## 2023-03-22 NOTE — ED Triage Notes (Signed)
 Pt was seen at [facility] today and did have scans, she left before seeing the doctor.

## 2023-03-22 NOTE — Discharge Instructions (Signed)
 We were able to review your head and neck CT scans that were done at [facility].  These scans were normal.  You can take 1000 mg of Tylenol  every 8 hours, 400 mg of ibuprofen every 6 hours.  You may begin to feel headache over the next few days, this is normal.  Make sure that you are getting plenty of rest, drinking plenty of water.  You can go to sleep tonight.  Your x-ray of your knee was negative.  Be safe with the bad weather incoming!

## 2023-03-22 NOTE — ED Triage Notes (Addendum)
 Pt states missed a step and fell forward. Pt has lac to upper lip, hematoma to middle of forehead, pt denies LOC.   Pt does endorse neck pain from fall.   C-collar placed in triage.   Daughter is aggressive in triage.

## 2023-03-22 NOTE — ED Provider Triage Note (Signed)
 Emergency Medicine Provider Triage Evaluation Note  Pamela [name] , a 68 y.o. female  was evaluated in triage.  Pt complains of falling  1 hour ago. Patient denies LOC. Patient looks lethargic   Review of Systems  Positive:  Negative:  Physical Exam  BP (!) 159/100 (BP Location: Right Arm)   Pulse (!) 108   Temp 98.4 F (36.9 C) (Oral)   Resp 18   Ht 5' 8 (1.727 m)   Wt 106 kg   SpO2 99%   BMI 35.53 kg/m  Gen:   Awake, no distress, lethargic, periorbital edema, lib edema, laceration of upper lip. Resp:  Normal effort  MSK:   Moves extremities without difficulty  Other:    Medical Decision Making  Medically screening exam initiated at 3:35 PM.  Appropriate orders placed.  Pamela [name] was informed that the remainder of the evaluation will be completed by another provider, this initial triage assessment does not replace that evaluation, and the importance of remaining in the ED until their evaluation is complete.  Patient with trauma, CT head, CT neck   Janit Kast, PA-C 03/22/23 1538

## 2023-03-22 NOTE — ED Notes (Signed)
 Pt daughter to front desk asking for an update on how much long it would be. Pt daughter informed by this RN that I could not give her a wait time. Pt daughter responded I didn't ask for a wait time. Pt daughter informed that I could not tell her how much longer it would be. Pt daughter asked if someone in administration for the department was here, I explained to her that I could check to see if my supervisor was here but if not then this RN would be who she needed to talk to. Pt daughter very argumentative about what this RNs role is. This RN attempted to call the department director, who did not answer.

## 2023-03-22 NOTE — ED Provider Notes (Signed)
 [place] EMERGENCY DEPARTMENT AT [facility] Provider Note   CSN: [id number] Arrival date & time: 03/22/23  1758     History  No chief complaint on file.   Pamela [name] is a 68 y.o. female.  68 year old female here today after she had a nonsyncopal fall from standing.  Patient was walking up stairs into her home, missed a step and fell forward striking her forehead on a piece of wooden furniture.  She went to [facility], had CT imaging of the head and neck done, but left before seeing a doctor.  She is not on blood thinners.        Home Medications Prior to Admission medications   Medication Sig Start Date End Date Taking? Authorizing Provider  adalimumab  (HUMIRA , 2 PEN,) 40 MG/0.4ML pen Inject 0.4 mLs (40 mg total) into the skin every 14 (fourteen) days. 12/28/22   Dolphus Reiter, MD  ALPRAZolam  (XANAX ) 0.25 MG tablet TAKE 1 TABLET BY MOUTH TWICE A DAY AS NEEDED 02/01/23   Norleen Lynwood ORN, MD  atorvastatin  (LIPITOR) 20 MG tablet TAKE 1 TABLET BY MOUTH EVERY DAY 02/01/23   Norleen Lynwood ORN, MD  citalopram  (CELEXA ) 40 MG tablet TAKE 1 TABLET BY MOUTH EVERY DAY 09/25/22   Norleen Lynwood ORN, MD  cyclobenzaprine  (FLEXERIL ) 5 MG tablet TAKE 1 TABLET BY MOUTH THREE TIMES A DAY AS NEEDED FOR MUSCLE SPASM 03/12/23   Norleen Lynwood ORN, MD  diclofenac  Sodium (VOLTAREN  ARTHRITIS PAIN) 1 % GEL Apply topically daily.    [provider]  gabapentin  (NEURONTIN ) 600 MG tablet Take 1 tablet (600 mg total) by mouth 3 (three) times daily. 12/25/22   Norleen Lynwood ORN, MD  hydrochlorothiazide  (MICROZIDE ) 12.5 MG capsule TAKE 1 CAPSULE(12.5 MG) BY MOUTH DAILY 02/01/23   Norleen Lynwood ORN, MD  losartan  (COZAAR ) 100 MG tablet TAKE 1 TABLET BY MOUTH EVERY DAY 02/01/23   Norleen Lynwood ORN, MD  methotrexate  (RHEUMATREX) 2.5 MG tablet Take 4 tablets (10 mg total) by mouth once a week. 03/15/23   Dolphus Reiter, MD  metoCLOPramide  (REGLAN ) 10 MG tablet TAKE 1 TABLET BY MOUTH TWICE A DAY IN THE MORNING  AND IN THE EVENING 02/15/23   Beather Delon Gibson, PA  Multiple Vitamin (MULTIVITAMIN WITH MINERALS) TABS tablet Take 1 tablet by mouth daily.    [provider]  oxyCODONE -acetaminophen  (PERCOCET) 5-325 MG tablet Take 1 tablet by mouth every 4 (four) hours as needed for severe pain (pain score 7-10). 02/16/23   Urbano Albright, MD  pantoprazole  (PROTONIX ) 40 MG tablet TAKE 1 TABLET BY MOUTH TWICE A DAY 09/05/22   Beather Delon Gibson, PA  traMADol  (ULTRAM ) 50 MG tablet TAKE 1 TABLET BY MOUTH EVERY 6 HOURS AS NEEDED FOR PAIN 03/15/23   Norleen Lynwood ORN, MD  traZODone  (DESYREL ) 50 MG tablet TAKE 1/2 TO 1 TABLET BY MOUTH AT BEDTIME AS NEEDED FOR SLEEP 12/11/22   Norleen Lynwood ORN, MD  zolpidem  (AMBIEN ) 10 MG tablet TAKE 1 TABLET BY MOUTH EVERY DAY AT BEDTIME AS NEEDED 03/12/23   Norleen Lynwood ORN, MD      Allergies    Cymbalta  [duloxetine  hcl]    Review of Systems   Review of Systems  Physical Exam Updated Vital Signs BP (!) 154/81   Pulse 89   Temp (!) 97 F (36.1 C) (Temporal)   Resp 16   SpO2 97%  Physical Exam Vitals reviewed.  Constitutional:      Appearance: Normal appearance.  HENT:  Head: Normocephalic.     Comments: Contusion above the left eyebrow, no laceration, no crepitus.  The swelling of the upper lip, small abrasion in the philtrum.    Mouth/Throat:     Mouth: Mucous membranes are moist.     Comments: No loose or missing teeth Cardiovascular:     Rate and Rhythm: Normal rate.  Musculoskeletal:     Cervical back: Normal range of motion and neck supple. No rigidity.     Comments: Tenderness of the left knee, no significant swelling  Lymphadenopathy:     Cervical: No cervical adenopathy.  Neurological:     Mental Status: She is alert.     Gait: Gait normal.     Comments: 5-5 upper extremity strength, intact sensation.     ED Results / Procedures / Treatments   Labs (all labs ordered are listed, but only abnormal results are displayed) Labs Reviewed - No  data to display  EKG None  Radiology DG Knee 2 Views Left Result Date: 03/22/2023 CLINICAL DATA:  Left knee pain after missing a step. EXAM: LEFT KNEE - 1-2 VIEW COMPARISON:  Radiographs 10/14/2017. FINDINGS: Status post left total hip arthroplasty. The hardware appears intact, without loosening. No evidence of acute fracture or dislocation. The knee is flexed approximately 50 degrees on the lateral view. No significant joint effusion or focal soft tissue abnormality is seen. IMPRESSION: No evidence of acute fracture or dislocation. Status post left total knee arthroplasty. Electronically Signed   By: Elsie Perone M.D.   On: 03/22/2023 18:57   CT HEAD WO CONTRAST ( ) Result Date: 03/22/2023 CLINICAL DATA:  Fall, head and neck trauma EXAM: CT HEAD WITHOUT CONTRAST CT CERVICAL SPINE WITHOUT CONTRAST TECHNIQUE: Multidetector CT imaging of the head and cervical spine was performed following the standard protocol without intravenous contrast. Multiplanar CT image reconstructions of the cervical spine were also generated. RADIATION DOSE REDUCTION: This exam was performed according to the departmental dose-optimization program which includes automated exposure control, adjustment of the mA and/or kV according to patient size and/or use of iterative reconstruction technique. COMPARISON:  No prior CT head or cervical spine available, correlation is made with MRI of the cervical spine 08/05/2019 FINDINGS: CT HEAD FINDINGS Brain: No evidence of acute infarct, hemorrhage, mass, mass effect, or midline shift. No hydrocephalus or extra-axial fluid collection. Vascular: No hyperdense vessel. Skull: Negative for fracture or focal lesion. Left paramedian forehead hematoma. Sinuses/Orbits: Mucous retention cysts in the left frontal sinus and left maxillary sinus. Other: The mastoid air cells are well aerated. CT CERVICAL SPINE FINDINGS Alignment: No traumatic listhesis. Skull base and vertebrae: No acute fracture or  suspicious osseous lesion. ACDF C4-C5, with posterior fusion C4-C6 and solid osseous fusion C4-C7. Evaluation is somewhat limited Soft tissues and spinal canal: By beam hardening artifact from the patient's spinal hardware. Within this limitation, no prevertebral fluid or swelling and no visible canal hematoma. Disc levels: Degenerative changes in the cervical spine.No high-grade spinal canal stenosis. Upper chest: No focal pulmonary opacity or pleural effusion. IMPRESSION: 1. No acute intracranial process. 2. No acute fracture or traumatic listhesis in the cervical spine. Electronically Signed   By: Donald Campion M.D.   On: 03/22/2023 16:09   CT Cervical Spine Wo Contrast Result Date: 03/22/2023 CLINICAL DATA:  Fall, head and neck trauma EXAM: CT HEAD WITHOUT CONTRAST CT CERVICAL SPINE WITHOUT CONTRAST TECHNIQUE: Multidetector CT imaging of the head and cervical spine was performed following the standard protocol without intravenous contrast. Multiplanar CT image  reconstructions of the cervical spine were also generated. RADIATION DOSE REDUCTION: This exam was performed according to the departmental dose-optimization program which includes automated exposure control, adjustment of the mA and/or kV according to patient size and/or use of iterative reconstruction technique. COMPARISON:  No prior CT head or cervical spine available, correlation is made with MRI of the cervical spine 08/05/2019 FINDINGS: CT HEAD FINDINGS Brain: No evidence of acute infarct, hemorrhage, mass, mass effect, or midline shift. No hydrocephalus or extra-axial fluid collection. Vascular: No hyperdense vessel. Skull: Negative for fracture or focal lesion. Left paramedian forehead hematoma. Sinuses/Orbits: Mucous retention cysts in the left frontal sinus and left maxillary sinus. Other: The mastoid air cells are well aerated. CT CERVICAL SPINE FINDINGS Alignment: No traumatic listhesis. Skull base and vertebrae: No acute fracture or  suspicious osseous lesion. ACDF C4-C5, with posterior fusion C4-C6 and solid osseous fusion C4-C7. Evaluation is somewhat limited Soft tissues and spinal canal: By beam hardening artifact from the patient's spinal hardware. Within this limitation, no prevertebral fluid or swelling and no visible canal hematoma. Disc levels: Degenerative changes in the cervical spine.No high-grade spinal canal stenosis. Upper chest: No focal pulmonary opacity or pleural effusion. IMPRESSION: 1. No acute intracranial process. 2. No acute fracture or traumatic listhesis in the cervical spine. Electronically Signed   By: Donald Campion M.D.   On: 03/22/2023 16:09    Procedures Procedures    Medications Ordered in ED Medications  oxyCODONE -acetaminophen  (PERCOCET/ROXICET ) 5-325 MG per tablet 1 tablet (1 tablet Oral Given 03/22/23 1848)    ED Course/ Medical Decision Making/ A&P                                 Medical Decision Making 68 year old female here today after nonsyncopal fall from standing earlier today.  Plan-patient had CT imaging done at outpatient facility, was able to review the scans.  No intracranial hemorrhage, no cervical spine injury.  Patient without any evidence of spinal cord injury, no weakness or deficits.  Patient is complaining some pain in her left knee.  Will obtain plain films.  Have provided some analgesia.  Shoulders, arms nontender, no deformity.  Abdomen soft, did not strike chest or abdomen.  Reassessment 7:55 PM-patient's plain films of the knee negative.  She is ambulatory.  Will discharge, have her follow-up with her PCP.  Amount and/or Complexity of Data Reviewed Radiology: ordered.  Risk Prescription drug management.           Final Clinical Impression(s) / ED Diagnoses Final diagnoses:  Contusion of scalp, initial encounter  Concussion without loss of consciousness, initial encounter    Rx / DC Orders ED Discharge Orders     None         Mannie Fairy DASEN, DO 03/22/23 1959

## 2023-03-23 ENCOUNTER — Encounter: Payer: Medicare HMO | Admitting: Physical Medicine & Rehabilitation

## 2023-04-03 ENCOUNTER — Encounter: Payer: Medicare HMO | Attending: Physical Medicine & Rehabilitation | Admitting: Physical Medicine & Rehabilitation

## 2023-04-03 ENCOUNTER — Encounter: Payer: Self-pay | Admitting: Physical Medicine & Rehabilitation

## 2023-04-03 VITALS — BP 153/83 | HR 102 | Ht 68.0 in | Wt 236.0 lb

## 2023-04-03 DIAGNOSIS — M545 Low back pain, unspecified: Secondary | ICD-10-CM | POA: Diagnosis not present

## 2023-04-03 DIAGNOSIS — Z79891 Long term (current) use of opiate analgesic: Secondary | ICD-10-CM | POA: Insufficient documentation

## 2023-04-03 DIAGNOSIS — G8929 Other chronic pain: Secondary | ICD-10-CM | POA: Insufficient documentation

## 2023-04-03 DIAGNOSIS — M1711 Unilateral primary osteoarthritis, right knee: Secondary | ICD-10-CM | POA: Diagnosis not present

## 2023-04-03 DIAGNOSIS — M0579 Rheumatoid arthritis with rheumatoid factor of multiple sites without organ or systems involvement: Secondary | ICD-10-CM | POA: Diagnosis not present

## 2023-04-03 MED ORDER — OXYCODONE-ACETAMINOPHEN 5-325 MG PO TABS: 1.0000 | ORAL_TABLET | Freq: Four times a day (QID) | ORAL | 0 refills | Status: DC | PRN

## 2023-04-03 NOTE — Progress Notes (Signed)
Subjective:    Patient ID: Pamela [name], female    DOB: 09/21/1955, 68 y.o.   MRN: [id number]  HPI   Pamela [name] is a 68 y.o. year old female  who  has a past medical history of ALLERGIC RHINITIS (12/18/2006), Anxiety, Arthritis, DEPRESSION, SITUATIONAL (03/15/2007), GERD (gastroesophageal reflux disease), Headache, History of kidney stones, HYPERLIPIDEMIA (12/18/2006), Hypertension, HYPERTENSION (12/18/2006), INSOMNIA-SLEEP DISORDER-UNSPEC (08/25/2009), 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) (04/03/2012), Pneumonia, and RENAL CALCULUS, HX OF (12/18/2006).   They are presenting to PM&R clinic as a new patient for pain management evaluation. They were referred by Dr. Oliver Barre for treatment of chronic pain and spinal stenosis pain.  [name] reports that she developed pain about 2 years ago in her lower back after a fall.  She says she was told in the ER that she had a pinched nerve in her back.  Back pain has worsened gradually since this time.  Pain does not radiate down her legs.  It is worsened with activities and spinal extension.  She also has less severe pain in her left knee.  She had a right TKA but would like to avoid having surgery on her left side.  (Addendum, pain is worse in R knee where she has OA, hx L TKA) She is getting Orthovisc injections for her right knee by rheumatology.  She has intermittent pain due to right trochanteric bursitis.  She reports this is currently under control.  She also has occasional pain in her shoulders, history of left tendinopathy followed by rheumatology.  She reports this is currently under control.  She is followed by rheumatology for rheumatoid arthritis and osteoarthritis.   She reports history of depression.  His mood is up-and-down.  No SI or HI.    Red flag symptoms: No red flags for back pain endorsed in Hx or ROS  Medications tried:  Topical medications- voltaren helped a little  Nsaids - Didn't help Tylenol -  denies benefit  Opiates  Hydrocodone 10 mg 4 times daily as needed-reports this is not keeping her pain under control, she says it does ease the pain Tramadol-reports minimal benefit Gabapentin - For nerve pain L foot-started started after surgery/ She had drop foot that improved. Foot pain is controlled by gabapentin TCAs  - Denies  SNRIs  - Denies  Flexeril -does not help much    Other treatments: PT- denies  TENs unit - Denies  Injections- Denies  Surgery reports history of cervical spine surgery x3   Prior UDS results: No results found for: "LABOPIA", "COCAINSCRNUR", "LABBENZ", "AMPHETMU", "THCU", "LABBARB"     Interval history 02/16/2023 Dobbin reports that her pain has been worse since she ran out of hydrocodone 10 mg.  She she has more trouble doing things around the house and is less active since this time.  She continues to have pain in her lower back.  She is also been having pain in her knees.  She reports history of bilateral knee OA and has a history of left TKA.  She had Orthovisc injections completed by rheumatology for her right knee.   Interval History 04/03/23 Patient had a recent fall when she took a misstep while walking up the stairs at her home.  She fell forward and hit her head on a piece of furniture.  She went to the ER, had CT scans of her head and neck done without acute findings.  She reports that Percocet 5 was helping her pain,  it was not relieving her pain completely but was keeping it more tolerable.  She denies any side effects with the medication.  She continues to report difficulty getting therapy or MRI due to limited money for these treatments.  Pain Inventory Average Pain 7 Pain Right Now 7 My pain is aching  In the last 24 hours, has pain interfered with the following? General activity 5 Relation with others 0 Enjoyment of life 1 What TIME of day is your pain at its worst? daytime Sleep (in general) Poor  Pain is worse with: walking and  bending Pain improves with: medication Relief from Meds: 8  walk without assistance how many minutes can you walk? 25 ability to climb steps?  yes do you drive?  yes  retired  weakness trouble walking spasms depression anxiety  Any changes since last visit?  no  Any changes since last visit?  no Primary care Oliver Barre MD    Family History  Problem Relation Age of Onset   Stroke Mother    Heart disease Father    Lung cancer Sister    Lung cancer Brother    Healthy Daughter    Healthy Daughter    Healthy Daughter    Colon cancer Neg Hx    Esophageal cancer Neg Hx    Pancreatic cancer Neg Hx    Stomach cancer Neg Hx    Social History   Socioeconomic History   Marital status: Married    Spouse name: Not on file   Number of children: 3   Years of education: Not on file   Highest education level: Not on file  Occupational History   Not on file  Tobacco Use   Smoking status: Former    Current packs/day: 0.00    Average packs/day: 0.3 packs/day for 3.0 years (0.8 ttl pk-yrs)    Types: Cigarettes    Start date: 76    Quit date: 1970    Years since quitting: 55.0    Passive exposure: Never   Smokeless tobacco: Never   Tobacco comments:    1970's  Vaping Use   Vaping status: Never Used  Substance and Sexual Activity   Alcohol use: Yes    Comment: occ   Drug use: No   Sexual activity: Yes  Other Topics Concern   Not on file  Social History Narrative   Not on file   Social Drivers of Health   Financial Resource Strain: Low Risk  (08/15/2022)   Overall Financial Resource Strain (CARDIA)    Difficulty of Paying Living Expenses: Not hard at all  Food Insecurity: No Food Insecurity (08/15/2022)   Hunger Vital Sign    Worried About Running Out of Food in the Last Year: Never true    Ran Out of Food in the Last Year: Never true  Transportation Needs: No Transportation Needs (08/15/2022)   PRAPARE - Administrator, Civil Service (Medical): No     Lack of Transportation (Non-Medical): No  Physical Activity: Inactive (08/15/2022)   Exercise Vital Sign    Days of Exercise per Week: 0 days    Minutes of Exercise per Session: 0 min  Stress: No Stress Concern Present (08/15/2022)   [employer] of Occupational Health - Occupational Stress Questionnaire    Feeling of Stress : Not at all  Social Connections: Socially Integrated (08/15/2022)   Social Connection and Isolation Panel [NHANES]    Frequency of Communication with Friends and Family: More than three times a  week    Frequency of Social Gatherings with Friends and Family: More than three times a week    Attends Religious Services: More than 4 times per year    Active Member of [employer] or Organizations: Yes    Attends Engineer, structural: More than 4 times per year    Marital Status: Married   Past Surgical History:  Procedure Laterality Date   CERVICAL DISC SURGERY     x 3   CONVERSION TO TOTAL KNEE Left 10/09/2016   Procedure: Conversion left uni compartment arthroplasty to total knee arthroplasty;  Surgeon: Durene Romans, MD;  Location: [facility];  Service: Orthopedics;  Laterality: Left;  90 mins   KNEE ARTHROSCOPY     partial   TOOTH EXTRACTION  2024   x2   total left knee arthoplasty     10/09/16 Dr. Charlann Boxer   Past Medical History:  Diagnosis Date   ALLERGIC RHINITIS 12/18/2006   Qualifier: Diagnosis of  By: Genelle Gather CMA, [place]     Anxiety    Arthritis    DEPRESSION, SITUATIONAL 03/15/2007   Qualifier: Diagnosis of  By: Nena Jordan    GERD (gastroesophageal reflux disease)    Headache    occasional   History of kidney stones    HYPERLIPIDEMIA 12/18/2006   Qualifier: History of  By: Genelle Gather CMA, [place]     Hypertension    HYPERTENSION 12/18/2006   Qualifier: Diagnosis of  By: Genelle Gather CMA, [place]     INSOMNIA-SLEEP DISORDER-UNSPEC 08/25/2009   Qualifier: Diagnosis of  By: Jonny Ruiz MD, Len Blalock    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)  04/03/2012   Pneumonia    RENAL CALCULUS, HX OF 12/18/2006   Qualifier: Diagnosis of  By: Genelle Gather CMA, [place]     BP (!) 153/83   Pulse (!) 102   Ht 5\' 8"  (1.727 m)   Wt 236 lb (107 kg)   SpO2 95%   BMI 35.88 kg/m   Opioid Risk Score:   Fall Risk Score:  `1  Depression screen [facility] 2/9     04/03/2023   11:15 AM 02/16/2023   10:01 AM 01/18/2023   10:35 AM 01/03/2023    1:51 PM 12/25/2022   11:04 AM 08/17/2022    8:09 AM 06/15/2022   10:31 AM  Depression screen PHQ 2/9  Decreased Interest 0 3 2 0 0 0 0  Down, Depressed, Hopeless 0 3 2 0 0 0 0  PHQ - 2 Score 0 6 4 0 0 0 0  Altered sleeping   2 0 0    Tired, decreased energy   2 0 0    Change in appetite   2 0 0    Feeling bad or failure about yourself    1 0 0    Trouble concentrating   1 0 0    Moving slowly or fidgety/restless   2 0 0    Suicidal thoughts   0 0 0    PHQ-9 Score   14 0 0    Difficult doing work/chores    Not difficult at all Somewhat difficult       Review of Systems  Constitutional:  Positive for appetite change.       Poor appetite  HENT: Negative.    Eyes: Negative.   Respiratory: Negative.    Gastrointestinal: Negative.   Endocrine: Negative.   Genitourinary: Negative.   Musculoskeletal:  Positive for back pain and gait problem.  Spasms  Skin: Negative.   Allergic/Immunologic: Negative.   Hematological: Negative.   Psychiatric/Behavioral:  Positive for agitation and dysphoric mood.   All other systems reviewed and are negative.      Objective:   Physical Exam  Gen: no distress, normal appearing HEENT: oral mucosa pink and moist, NCAT Chest: normal effort, normal rate of breathing Abd: soft, non-distended Ext: no edema Psych: pleasant, normal affect Skin: intact Neuro: Alert and awake, follows commands, cranial nerves II through XII grossly intact, normal speech and language RUE: 5/5 Deltoid, 5/5 Biceps, 5/5 Triceps, 5/5 Wrist Ext, 5/5 Grip LUE: 5/5 Deltoid, 5/5 Biceps, 5/5  Triceps, 5/5 Wrist Ext, 5/5 Grip RLE: HF 5/5, KE 5/5, ADF 5/5, APF 5/5 LLE: HF 5/5, KE 5/5, ADF 5/5, APF 5/5 Sensory exam normal for light touch and pain in all 4 limbs.   Musculoskeletal:  Slump test negative bilaterally Pain with spinal extension present Facet loading positive Lumbar paraspinal tenderness noted in the lower greater than upper paraspinal muscles No significant cervical paraspinal muscle tenderness Mild tenderness joint line right knee Left knee TKA scar-mild TTP left knee Decreased range of motion at C-spine and L-spine No actively red/inflamed joints noted   Xray L spine 05/12/2020 IMPRESSION: 1. No acute findings. 2. Mild multilevel degenerative disc disease with disc space narrowing at L3-4. 3. Facet hypertrophy in the lower lumbar spine. 4. Levoconvex rotatory scoliosis.  C spine/head Ct 03/22/23 CT CERVICAL SPINE FINDINGS   Alignment: No traumatic listhesis.   Skull base and vertebrae: No acute fracture or suspicious osseous lesion. ACDF C4-C5, with posterior fusion C4-C6 and solid osseous fusion C4-C7. Evaluation is somewhat limited   Soft tissues and spinal canal: By beam hardening artifact from the patient's spinal hardware. Within this limitation, no prevertebral fluid or swelling and no visible canal hematoma.   Disc levels: Degenerative changes in the cervical spine.No high-grade spinal canal stenosis.   Upper chest: No focal pulmonary opacity or pleural effusion.   IMPRESSION: 1. No acute intracranial process. 2. No acute fracture or traumatic listhesis in the cervical spine.        Assessment & Plan:   1) Chronic lower back , pain is primarily axial -Prior x-ray with multilevel DDD and evidence of facet hypertrophy, scoliosis  2) Depression, denies SI or HI -On Celexa 40 mg daily  3) Rheumatoid arthritis followed by rheumatology  4) History of MGUS, review of notes indicate due for follow-up and PCP has referred to hematology  5)  R knee OA status, post left TKA  Plan -Discussed foods for pain -Hold off on TENS unit for now due to MDS -Continue UDS and pill counts.  Continue PDMP monitoring.  Pain contract completed prior visit. -Discussed bringing pill bottle with any medications even if empty to all appointments -Discussed goals of pain management, opioid treatment not expected to completely eliminate pain, goal to make it tolerable and improved function -Encouraged her to complete MRI, she has not yet completed due to cost -UDS consistent overall other than she did have some alcohol metabolites noted-she denies drinking regularly.  We discussed discontinuing alcohol use as there is increased risk now that she is using pain medication, she agrees. -Suspect lower back pain may be related to facet joint degeneration, MMB/RFA discussed- Reviewed with pt she will consider  -Referral to physical therapy placed for her back pain-Limited by cost -Continue Percocet 5 every 6 hours as needed.  #120  ordered -Reviewed bringing medication and for pill counts -List of  home exercises for lower back pain provided, advised trying 3 times a week

## 2023-04-05 ENCOUNTER — Inpatient Hospital Stay: Payer: Medicare HMO | Attending: Hematology and Oncology | Admitting: Hematology and Oncology

## 2023-04-05 ENCOUNTER — Inpatient Hospital Stay: Payer: Medicare HMO

## 2023-04-09 ENCOUNTER — Ambulatory Visit: Payer: Medicare HMO | Admitting: Internal Medicine

## 2023-04-11 ENCOUNTER — Other Ambulatory Visit (HOSPITAL_COMMUNITY): Payer: Self-pay

## 2023-04-12 ENCOUNTER — Other Ambulatory Visit: Payer: Self-pay | Admitting: Physician Assistant

## 2023-04-12 ENCOUNTER — Other Ambulatory Visit: Payer: Self-pay | Admitting: Rheumatology

## 2023-05-01 ENCOUNTER — Ambulatory Visit: Payer: Medicare HMO | Admitting: Physician Assistant

## 2023-05-03 ENCOUNTER — Ambulatory Visit: Payer: Self-pay | Admitting: Internal Medicine

## 2023-05-03 NOTE — Telephone Encounter (Signed)
Chief Complaint: Right leg, left hip pain Symptoms: Pain shoots down left leg, fatigue Frequency: Intermittent Pertinent Negatives: Patient denies fever Disposition: [] ED /[] Urgent Care (no appt availability in office) / [x] Appointment(In office/virtual)/ []  [facility]/ [] Home Care/ [] Refused Recommended Disposition /[] [facility]/ []  Follow-up with PCP Additional Notes: Pt has had right leg and left hip pain for a couple months that is getting worse. Pt states the pain is with activity. Pt scheduled for an appointment tomorrow. This RN educated pt on home care, new-worsening symptoms, when to call back/seek emergent care. Pt verbalized understanding and agrees to plan.   Copied from CRM [id number] [id number]. Topic: Clinical - Red Word Triage >> May 03, 2023  2:14 PM [place] E wrote: Patient has been experiencing some pain in her hips and both legs. Describes it as an "achy" pain that makes it difficult for her to walk. She has seen her PCP for this issue, but states it has gotten worse in the past few months. Reason for Disposition  [1] MODERATE pain (e.g., interferes with normal activities, limping) AND [2] present > 3 days  Answer Assessment - Initial Assessment Questions 1. LOCATION and RADIATION: "Where is the pain located?"      Right leg, left hip 2. QUALITY: "What does the pain feel like?"  (e.g., sharp, dull, aching, burning)     Aching 3. SEVERITY: "How bad is the pain?" "What does it keep you from doing?"   (Scale 1-10; or mild, moderate, severe)   -  MILD (1-3): doesn't interfere with normal activities    -  MODERATE (4-7): interferes with normal activities (e.g., work or school) or awakens from sleep, limping    -  SEVERE (8-10): excruciating pain, unable to do any normal activities, unable to walk     8-10 4. ONSET: "When did the pain start?" "Does it come and go, or is it there all the time?"     A couple months ago, intermittent with activity 5. CAUSE: "What  do you think is causing the hip pain?"      Similar to arthritis 6. AGGRAVATING FACTORS: "What makes the hip pain worse?" (e.g., walking, climbing stairs, running)     Activity 7. OTHER SYMPTOMS: "Do you have any other symptoms?" (e.g., back pain, pain shooting down leg,  fever, rash)     Pain shoots down left leg  Protocols used: Hip Pain-A-AH

## 2023-05-04 ENCOUNTER — Ambulatory Visit: Payer: Medicare HMO | Admitting: Internal Medicine

## 2023-05-05 ENCOUNTER — Other Ambulatory Visit: Payer: Self-pay | Admitting: Rheumatology

## 2023-05-05 ENCOUNTER — Other Ambulatory Visit: Payer: Self-pay | Admitting: Physician Assistant

## 2023-05-07 ENCOUNTER — Telehealth: Payer: Self-pay | Admitting: Physical Medicine & Rehabilitation

## 2023-05-07 ENCOUNTER — Ambulatory Visit: Payer: Medicare HMO | Admitting: Family Medicine

## 2023-05-07 NOTE — Progress Notes (Deleted)
   Acute Office Visit  Subjective:     Patient ID: Pamela [name], female    DOB: 1955-09-02, 68 y.o.   MRN: [id number]  No chief complaint on file.   HPI Patient is in today for ***  ROS Per HPI      Objective:    There were no vitals taken for this visit.   Physical Exam Vitals and nursing note reviewed.  Constitutional:      Appearance: Normal appearance. She is normal weight.  HENT:     Head: Normocephalic and atraumatic.     Right Ear: Tympanic membrane and ear canal normal.     Left Ear: Tympanic membrane and ear canal normal.     Nose: Nose normal.  Eyes:     Extraocular Movements: Extraocular movements intact.     Pupils: Pupils are equal, round, and reactive to light.  Cardiovascular:     Rate and Rhythm: Normal rate and regular rhythm.     Heart sounds: Normal heart sounds.  Pulmonary:     Effort: Pulmonary effort is normal.     Breath sounds: Normal breath sounds.  Musculoskeletal:        General: Normal range of motion.     Cervical back: Normal range of motion.  Neurological:     General: No focal deficit present.     Mental Status: She is alert and oriented to person, place, and time.  Psychiatric:        Mood and Affect: Mood normal.        Thought Content: Thought content normal.   No results found for any visits on 05/07/23.      Assessment & Plan:  ***  No orders of the defined types were placed in this encounter.   No follow-ups on file.  Moshe Cipro, FNP

## 2023-05-07 NOTE — Telephone Encounter (Signed)
 Patient is calling asking for pain meds for pain in right hip. Patient stated she was to call if she needed it.

## 2023-05-08 MED ORDER — OXYCODONE-ACETAMINOPHEN 5-325 MG PO TABS
1.0000 | ORAL_TABLET | Freq: Four times a day (QID) | ORAL | 0 refills | Status: DC | PRN
Start: 2023-05-08 — End: 2023-06-11

## 2023-05-10 ENCOUNTER — Inpatient Hospital Stay (HOSPITAL_COMMUNITY)
Admission: EM | Admit: 2023-05-10 | Discharge: 2023-05-12 | DRG: 871 | Disposition: A | Discharge status: Home / Self Care | Payer: Medicare HMO | Attending: Internal Medicine | Admitting: Internal Medicine

## 2023-05-10 ENCOUNTER — Encounter (HOSPITAL_COMMUNITY): Payer: Self-pay

## 2023-05-10 ENCOUNTER — Other Ambulatory Visit: Payer: Self-pay

## 2023-05-10 ENCOUNTER — Emergency Department (HOSPITAL_COMMUNITY): Payer: Medicare HMO

## 2023-05-10 DIAGNOSIS — Z79899 Other long term (current) drug therapy: Secondary | ICD-10-CM

## 2023-05-10 DIAGNOSIS — E785 Hyperlipidemia, unspecified: Secondary | ICD-10-CM | POA: Diagnosis present

## 2023-05-10 DIAGNOSIS — Z96652 Presence of left artificial knee joint: Secondary | ICD-10-CM | POA: Diagnosis present

## 2023-05-10 DIAGNOSIS — Z87442 Personal history of urinary calculi: Secondary | ICD-10-CM

## 2023-05-10 DIAGNOSIS — Z79620 Long term (current) use of immunosuppressive biologic: Secondary | ICD-10-CM

## 2023-05-10 DIAGNOSIS — A419 Sepsis, unspecified organism: Secondary | ICD-10-CM | POA: Diagnosis not present

## 2023-05-10 DIAGNOSIS — F411 Generalized anxiety disorder: Secondary | ICD-10-CM | POA: Diagnosis present

## 2023-05-10 DIAGNOSIS — K219 Gastro-esophageal reflux disease without esophagitis: Secondary | ICD-10-CM | POA: Diagnosis present

## 2023-05-10 DIAGNOSIS — J189 Pneumonia, unspecified organism: Secondary | ICD-10-CM | POA: Diagnosis present

## 2023-05-10 DIAGNOSIS — R6510 Systemic inflammatory response syndrome (SIRS) of non-infectious origin without acute organ dysfunction: Secondary | ICD-10-CM | POA: Diagnosis present

## 2023-05-10 DIAGNOSIS — I10 Essential (primary) hypertension: Secondary | ICD-10-CM | POA: Diagnosis not present

## 2023-05-10 DIAGNOSIS — R000 Tachycardia, unspecified: Secondary | ICD-10-CM | POA: Diagnosis not present

## 2023-05-10 DIAGNOSIS — Z743 Need for continuous supervision: Secondary | ICD-10-CM | POA: Diagnosis not present

## 2023-05-10 DIAGNOSIS — R4182 Altered mental status, unspecified: Secondary | ICD-10-CM | POA: Diagnosis not present

## 2023-05-10 DIAGNOSIS — Z823 Family history of stroke: Secondary | ICD-10-CM

## 2023-05-10 DIAGNOSIS — F32A Depression, unspecified: Secondary | ICD-10-CM | POA: Diagnosis present

## 2023-05-10 DIAGNOSIS — N12 Tubulo-interstitial nephritis, not specified as acute or chronic: Secondary | ICD-10-CM | POA: Diagnosis present

## 2023-05-10 DIAGNOSIS — R410 Disorientation, unspecified: Principal | ICD-10-CM

## 2023-05-10 DIAGNOSIS — N1831 Chronic kidney disease, stage 3a: Secondary | ICD-10-CM | POA: Diagnosis present

## 2023-05-10 DIAGNOSIS — I129 Hypertensive chronic kidney disease with stage 1 through stage 4 chronic kidney disease, or unspecified chronic kidney disease: Secondary | ICD-10-CM | POA: Diagnosis present

## 2023-05-10 DIAGNOSIS — E872 Acidosis, unspecified: Secondary | ICD-10-CM | POA: Diagnosis present

## 2023-05-10 DIAGNOSIS — G9340 Encephalopathy, unspecified: Secondary | ICD-10-CM | POA: Diagnosis present

## 2023-05-10 DIAGNOSIS — R9431 Abnormal electrocardiogram [ECG] [EKG]: Secondary | ICD-10-CM | POA: Diagnosis present

## 2023-05-10 DIAGNOSIS — Z6835 Body mass index (BMI) 35.0-35.9, adult: Secondary | ICD-10-CM

## 2023-05-10 DIAGNOSIS — Z888 Allergy status to other drugs, medicaments and biological substances status: Secondary | ICD-10-CM

## 2023-05-10 DIAGNOSIS — E669 Obesity, unspecified: Secondary | ICD-10-CM

## 2023-05-10 DIAGNOSIS — G9341 Metabolic encephalopathy: Secondary | ICD-10-CM | POA: Diagnosis present

## 2023-05-10 DIAGNOSIS — Z87891 Personal history of nicotine dependence: Secondary | ICD-10-CM

## 2023-05-10 DIAGNOSIS — E860 Dehydration: Secondary | ICD-10-CM | POA: Diagnosis present

## 2023-05-10 DIAGNOSIS — E876 Hypokalemia: Secondary | ICD-10-CM | POA: Diagnosis present

## 2023-05-10 DIAGNOSIS — G43909 Migraine, unspecified, not intractable, without status migrainosus: Secondary | ICD-10-CM | POA: Diagnosis present

## 2023-05-10 DIAGNOSIS — Z8249 Family history of ischemic heart disease and other diseases of the circulatory system: Secondary | ICD-10-CM

## 2023-05-10 DIAGNOSIS — Z801 Family history of malignant neoplasm of trachea, bronchus and lung: Secondary | ICD-10-CM

## 2023-05-10 DIAGNOSIS — G4700 Insomnia, unspecified: Secondary | ICD-10-CM | POA: Diagnosis present

## 2023-05-10 DIAGNOSIS — R590 Localized enlarged lymph nodes: Secondary | ICD-10-CM | POA: Diagnosis present

## 2023-05-10 DIAGNOSIS — M0579 Rheumatoid arthritis with rheumatoid factor of multiple sites without organ or systems involvement: Secondary | ICD-10-CM | POA: Diagnosis present

## 2023-05-10 DIAGNOSIS — Z1152 Encounter for screening for COVID-19: Secondary | ICD-10-CM

## 2023-05-10 LAB — CBC WITH DIFFERENTIAL/PLATELET
Abs Immature Granulocytes: 0.04 10*3/uL (ref 0.00–0.07)
Basophils Absolute: 0.1 10*3/uL (ref 0.0–0.1)
Basophils Relative: 1 %
Eosinophils Absolute: 0 10*3/uL (ref 0.0–0.5)
Eosinophils Relative: 0 %
HCT: 36.2 % (ref 36.0–46.0)
Hemoglobin: 12.4 g/dL (ref 12.0–15.0)
Immature Granulocytes: 0 %
Lymphocytes Relative: 8 %
Lymphs Abs: 1.1 10*3/uL (ref 0.7–4.0)
MCH: 28.1 pg (ref 26.0–34.0)
MCHC: 34.3 g/dL (ref 30.0–36.0)
MCV: 82.1 fL (ref 80.0–100.0)
Monocytes Absolute: 1.1 10*3/uL — ABNORMAL HIGH (ref 0.1–1.0)
Monocytes Relative: 7 %
Neutro Abs: 12 10*3/uL — ABNORMAL HIGH (ref 1.7–7.7)
Neutrophils Relative %: 84 %
Platelets: 249 10*3/uL (ref 150–400)
RBC: 4.41 MIL/uL (ref 3.87–5.11)
RDW: 13.3 % (ref 11.5–15.5)
WBC: 14.2 10*3/uL — ABNORMAL HIGH (ref 4.0–10.5)
nRBC: 0 % (ref 0.0–0.2)

## 2023-05-10 LAB — COMPREHENSIVE METABOLIC PANEL
ALT: 17 U/L (ref 0–44)
AST: 26 U/L (ref 15–41)
Albumin: 4.2 g/dL (ref 3.5–5.0)
Alkaline Phosphatase: 51 U/L (ref 38–126)
Anion gap: 17 — ABNORMAL HIGH (ref 5–15)
BUN: 10 mg/dL (ref 8–23)
CO2: 21 mmol/L — ABNORMAL LOW (ref 22–32)
Calcium: 10.5 mg/dL — ABNORMAL HIGH (ref 8.9–10.3)
Chloride: 97 mmol/L — ABNORMAL LOW (ref 98–111)
Creatinine, Ser: 1.08 mg/dL — ABNORMAL HIGH (ref 0.44–1.00)
GFR, Estimated: 56 mL/min — ABNORMAL LOW (ref >60)
Glucose, Bld: 127 mg/dL — ABNORMAL HIGH (ref 70–99)
Potassium: 3.1 mmol/L — ABNORMAL LOW (ref 3.5–5.1)
Sodium: 135 mmol/L (ref 135–145)
Total Bilirubin: 0.9 mg/dL (ref 0.0–1.2)
Total Protein: 7.7 g/dL (ref 6.5–8.1)

## 2023-05-10 LAB — I-STAT VENOUS BLOOD GAS, ED
Acid-Base Excess: 4 mmol/L — ABNORMAL HIGH (ref 0.0–2.0)
Bicarbonate: 26.3 mmol/L (ref 20.0–28.0)
Calcium, Ion: 1.22 mmol/L (ref 1.15–1.40)
HCT: 37 % (ref 36.0–46.0)
Hemoglobin: 12.6 g/dL (ref 12.0–15.0)
O2 Saturation: 97 %
Potassium: 3.3 mmol/L — ABNORMAL LOW (ref 3.5–5.1)
Sodium: 137 mmol/L (ref 135–145)
TCO2: 27 mmol/L (ref 22–32)
pCO2, Ven: 31.5 mm[Hg] — ABNORMAL LOW (ref 44–60)
pH, Ven: 7.529 — ABNORMAL HIGH (ref 7.25–7.43)
pO2, Ven: 79 mm[Hg] — ABNORMAL HIGH (ref 32–45)

## 2023-05-10 LAB — AMMONIA: Ammonia: 28 umol/L (ref 9–35)

## 2023-05-10 LAB — ACETAMINOPHEN LEVEL: Acetaminophen (Tylenol), Serum: <10 ug/mL — ABNORMAL LOW (ref 10–30)

## 2023-05-10 LAB — LACTIC ACID, PLASMA: Lactic Acid, Venous: 2 mmol/L (ref 0.5–1.9)

## 2023-05-10 LAB — PROTIME-INR
INR: 1.1 (ref 0.8–1.2)
Prothrombin Time: 14.4 s (ref 11.4–15.2)

## 2023-05-10 LAB — ETHANOL: Alcohol, Ethyl (B): <10 mg/dL (ref <10)

## 2023-05-10 LAB — SALICYLATE LEVEL: Salicylate Lvl: <7 mg/dL — ABNORMAL LOW (ref 7.0–30.0)

## 2023-05-10 MED ORDER — SODIUM CHLORIDE 0.9 % IV BOLUS (SEPSIS)
1000.0000 mL | Freq: Once | INTRAVENOUS | Status: AC
  Administered 2023-05-10: 1000 mL via INTRAVENOUS

## 2023-05-10 MED ORDER — METRONIDAZOLE 500 MG/100ML IV SOLN
500.0000 mg | Freq: Once | INTRAVENOUS | Status: AC
  Administered 2023-05-10: 500 mg via INTRAVENOUS
  Filled 2023-05-10: qty 100

## 2023-05-10 MED ORDER — VANCOMYCIN HCL IN DEXTROSE 1-5 GM/200ML-% IV SOLN
1000.0000 mg | Freq: Once | INTRAVENOUS | Status: AC
  Administered 2023-05-11: 1000 mg via INTRAVENOUS
  Filled 2023-05-10: qty 200

## 2023-05-10 MED ORDER — SODIUM CHLORIDE 0.9 % IV SOLN
2.0000 g | Freq: Once | INTRAVENOUS | Status: AC
  Administered 2023-05-10: 2 g via INTRAVENOUS
  Filled 2023-05-10: qty 12.5

## 2023-05-10 NOTE — ED Provider Notes (Signed)
 11:36 PM Assumed care from Dr. Eloise Harman, please see their note for full history, physical and decision making until this point. In brief this is a 68 y.o. year old female who presented to the ED tonight with Altered Mental Status     Here with confusion. Possible some extra medications at home. But was febrile/tachy here. Labs/imaging coming back.   CT shows pyelo. Urine equivocal as well. No other obvious source. Doubt meningitis without any real neurologic/CNS symptoms/findings. Already had antibitoics. Family states still confused, diminished and not her baseline so will d/w TRH for observation.   Labs, studies and imaging reviewed by myself and considered in medical decision making if ordered. Imaging interpreted by radiology.  Labs Reviewed  COMPREHENSIVE METABOLIC PANEL - Abnormal; Notable for the following components:      Result Value   Potassium 3.1 (*)    Chloride 97 (*)    CO2 21 (*)    Glucose, Bld 127 (*)    Creatinine, Ser 1.08 (*)    Calcium 10.5 (*)    GFR, Estimated 56 (*)    Anion gap 17 (*)    All other components within normal limits  CBC WITH DIFFERENTIAL/PLATELET - Abnormal; Notable for the following components:   WBC 14.2 (*)    Neutro Abs 12.0 (*)    Monocytes Absolute 1.1 (*)    All other components within normal limits  ACETAMINOPHEN LEVEL - Abnormal; Notable for the following components:   Acetaminophen (Tylenol), Serum <10 (*)    All other components within normal limits  SALICYLATE LEVEL - Abnormal; Notable for the following components:   Salicylate Lvl <7.0 (*)    All other components within normal limits  I-STAT VENOUS BLOOD GAS, ED - Abnormal; Notable for the following components:   pH, Ven 7.529 (*)    pCO2, Ven 31.5 (*)    pO2, Ven 79 (*)    Acid-Base Excess 4.0 (*)    Potassium 3.3 (*)    All other components within normal limits  CULTURE, BLOOD (ROUTINE X 2)  CULTURE, BLOOD (ROUTINE X 2)  RESP PANEL BY RT-PCR (RSV, FLU A&B, COVID)  [id number]   AMMONIA  ETHANOL  PROTIME-INR  RAPID URINE DRUG SCREEN, HOSP PERFORMED  URINALYSIS, ROUTINE W REFLEX MICROSCOPIC  LACTIC ACID, PLASMA  LACTIC ACID, PLASMA  CBG MONITORING, ED    DG Chest Port 1 View  Final Result    CT HEAD WO CONTRAST    (Results Pending)    No follow-ups on file.    Kelijah Towry, Barbara Cower, MD 05/12/23 [phone]

## 2023-05-10 NOTE — ED Triage Notes (Addendum)
 PER EMS: pt is from home with c/o Altered mental status, LKW 0800 this morning. Her daughter came home from work tonight and found her laying in her bed surrounded by her pill bottles. Unsure if she took any. Daughter states this is not the first time she may have taken extra pills. Ambien, oxycodone, xanax, lisinopril. Pill bottles were possibly knocked over but daughter is concerned she may have taken too many since she has a history of this. GCS-13.   BP- 143/106, HR-120, RR-17, O2-96%, CBG-124 20g LAC

## 2023-05-10 NOTE — ED Provider Notes (Signed)
 [facility] Provider Note   CSN: [id number] Arrival date & time: 05/10/23  2123     History {Add pertinent medical, surgical, social history, OB history to HPI:1} Chief Complaint  Patient presents with   Altered Mental Status    Pamela [name] is a 68 y.o. female.  68 yo F with hx of HTN and HLD who presents with altered mental status. Pamela [name] says that her older sister walked in on her mother and noticed some pills on the floor. She wasn't coherent or communicating with her. Then her sister called 911. Per her father, the patient was alert and walking around. Not complaining of headache, neck stiffness, or fevers. No prior SI attempts. Unsure if this was an intentional overdose. Daughter has covid.        Home Medications Prior to Admission medications   Medication Sig Start Date End Date Taking? Authorizing Provider  adalimumab (HUMIRA, 2 PEN,) 40 MG/0.4ML pen Inject 0.4 mLs (40 mg total) into the skin every 14 (fourteen) days. 12/28/22   Pollyann Savoy, MD  ALPRAZolam Prudy Feeler) 0.25 MG tablet TAKE 1 TABLET BY MOUTH TWICE A DAY AS NEEDED 02/01/23   Corwin Levins, MD  atorvastatin (LIPITOR) 20 MG tablet TAKE 1 TABLET BY MOUTH EVERY DAY 02/01/23   Corwin Levins, MD  citalopram (CELEXA) 40 MG tablet TAKE 1 TABLET BY MOUTH EVERY DAY 09/25/22   Corwin Levins, MD  cyclobenzaprine (FLEXERIL) 5 MG tablet TAKE 1 TABLET BY MOUTH THREE TIMES A DAY AS NEEDED FOR MUSCLE SPASM 03/12/23   Corwin Levins, MD  diclofenac Sodium (VOLTAREN ARTHRITIS PAIN) 1 % GEL Apply topically daily.    [provider]  gabapentin (NEURONTIN) 600 MG tablet Take 1 tablet (600 mg total) by mouth 3 (three) times daily. 12/25/22   Corwin Levins, MD  hydrochlorothiazide (MICROZIDE) 12.5 MG capsule TAKE 1 CAPSULE(12.5 MG) BY MOUTH DAILY 02/01/23   Corwin Levins, MD  losartan (COZAAR) 100 MG tablet TAKE 1 TABLET BY MOUTH EVERY DAY 02/01/23   Corwin Levins, MD   methotrexate (RHEUMATREX) 2.5 MG tablet Take 4 tablets (10 mg total) by mouth once a week. 03/15/23   Pollyann Savoy, MD  metoCLOPramide (REGLAN) 10 MG tablet TAKE 1 TABLET BY MOUTH TWICE A DAY IN THE MORNING AND IN THE EVENING 04/13/23   Unk Lightning, PA  Multiple Vitamin (MULTIVITAMIN WITH MINERALS) TABS tablet Take 1 tablet by mouth daily.    [provider]  oxyCODONE-acetaminophen (PERCOCET) 5-325 MG tablet Take 1 tablet by mouth every 6 (six) hours as needed for severe pain (pain score 7-10). 05/08/23   Fanny Dance, MD  pantoprazole (PROTONIX) 40 MG tablet TAKE 1 TABLET BY MOUTH TWICE A DAY 04/13/23   Unk Lightning, PA  traMADol (ULTRAM) 50 MG tablet TAKE 1 TABLET BY MOUTH EVERY 6 HOURS AS NEEDED FOR PAIN 03/15/23   Corwin Levins, MD  traZODone (DESYREL) 50 MG tablet TAKE 1/2 TO 1 TABLET BY MOUTH AT BEDTIME AS NEEDED FOR SLEEP 12/11/22   Corwin Levins, MD  zolpidem (AMBIEN) 10 MG tablet TAKE 1 TABLET BY MOUTH EVERY DAY AT BEDTIME AS NEEDED 03/12/23   Corwin Levins, MD      Allergies    Cymbalta [duloxetine hcl]    Review of Systems   Review of Systems  Physical Exam Updated Vital Signs BP (!) 160/115 (BP Location: Left Arm)   Pulse (!) 120  Temp (!) 101.5 F (38.6 C) (Rectal)   Resp (!) 24   SpO2 99%  Physical Exam Vitals and nursing note reviewed.  Constitutional:      General: She is not in acute distress.    Appearance: She is well-developed.  HENT:     Head: Normocephalic and atraumatic.     Right Ear: External ear normal.     Left Ear: External ear normal.     Nose: Nose normal.  Eyes:     Extraocular Movements: Extraocular movements intact.     Conjunctiva/sclera: Conjunctivae normal.     Pupils: Pupils are equal, round, and reactive to light.  Cardiovascular:     Rate and Rhythm: Regular rhythm. Tachycardia present.     Heart sounds: No murmur heard. Pulmonary:     Effort: Pulmonary effort is normal. No respiratory distress.      Breath sounds: Normal breath sounds.  Musculoskeletal:     Cervical back: Normal range of motion and neck supple.     Right lower leg: No edema.     Left lower leg: No edema.  Skin:    General: Skin is warm and dry.  Neurological:     Mental Status: She is alert. Mental status is at baseline.     Cranial Nerves: No cranial nerve deficit.     Sensory: No sensory deficit.     Motor: No weakness.     Comments: AAOx2  Psychiatric:        Mood and Affect: Mood normal.     ED Results / Procedures / Treatments   Labs (all labs ordered are listed, but only abnormal results are displayed) Labs Reviewed  CULTURE, BLOOD (ROUTINE X 2)  CULTURE, BLOOD (ROUTINE X 2)  RESP PANEL BY RT-PCR (RSV, FLU A&B, COVID)  RVPGX2  AMMONIA  COMPREHENSIVE METABOLIC PANEL  BLOOD GAS, VENOUS  RAPID URINE DRUG SCREEN, HOSP PERFORMED  ETHANOL  CBC WITH DIFFERENTIAL/PLATELET  URINALYSIS, ROUTINE W REFLEX MICROSCOPIC  PROTIME-INR  ACETAMINOPHEN LEVEL  SALICYLATE LEVEL  CBG MONITORING, ED    EKG None  Radiology No results found.  Procedures Procedures  {Document cardiac monitor, telemetry assessment procedure when appropriate:1}  Medications Ordered in ED Medications  sodium chloride 0.9 % bolus 1,000 mL (has no administration in time range)  ceFEPIme (MAXIPIME) 2 g in sodium chloride 0.9 % 100 mL IVPB (has no administration in time range)  metroNIDAZOLE (FLAGYL) IVPB 500 mg (has no administration in time range)  vancomycin (VANCOCIN) IVPB 1000 mg/200 mL premix (has no administration in time range)    ED Course/ Medical Decision Making/ A&P Clinical Course as of 05/10/23 2358  Thu May 10, 2023  2336 Signed out to Dr Pamela [name] [RP]    Clinical Course User Index [RP] Pamela Baton, MD   {   Click here for ABCD2, HEART and other calculatorsREFRESH Note before signing :1}                              Medical Decision Making Amount and/or Complexity of Data Reviewed Labs:  ordered. Radiology: ordered.  Risk OTC drugs. Prescription drug management. Decision regarding hospitalization.   Pamela [name] is a 68 y.o. female with comorbidities that complicate the patient evaluation including HTN and HLD who presents with altered mental status.   Initial Ddx:  ***   MDM/Course:  *** Upon re-evaluation ***  This patient presents to the ED for concern of complaints  listed in HPI, this involves an extensive number of treatment options, and is a complaint that carries with it a high risk of complications and morbidity. Disposition including potential need for admission considered.   Dispo: {Disposition:28069}  Additional history obtained from {Additional History:28067} Records reviewed {Records Reviewed:28068} The following labs were independently interpreted: {labs interpreted:28064} and show {lab findings:28250} I independently reviewed the following imaging with scope of interpretation limited to determining acute life threatening conditions related to emergency care: {imaging interpreted:28065} and agree with the radiologist interpretation with the following exceptions: none I personally reviewed and interpreted cardiac monitoring: {cardiac monitoring:28251} I personally reviewed and interpreted the pt's EKG: see above for interpretation  I have reviewed the patients home medications and made adjustments as needed Consults: {Consultants:28063} Social Determinants of health:  ***  Portions of this note were generated with Scientist, clinical (histocompatibility and immunogenetics). Dictation errors may occur despite best attempts at proofreading.    {Document your decision making why or why not admission, treatments were needed:1} Final Clinical Impression(s) / ED Diagnoses Final diagnoses:  None    Rx / DC Orders ED Discharge Orders     None

## 2023-05-10 NOTE — ED Notes (Signed)
 Dr. Eloise Harman informed of patient's lactic acid of 2.0 via face to face conversation

## 2023-05-10 NOTE — ED Notes (Signed)
 Dr. Eloise Harman at bedside

## 2023-05-10 NOTE — Sepsis Progress Note (Signed)
 Following for sepsis monitoring ?

## 2023-05-11 ENCOUNTER — Emergency Department (HOSPITAL_COMMUNITY): Payer: Medicare HMO

## 2023-05-11 DIAGNOSIS — E876 Hypokalemia: Secondary | ICD-10-CM | POA: Diagnosis not present

## 2023-05-11 DIAGNOSIS — N16 Renal tubulo-interstitial disorders in diseases classified elsewhere: Secondary | ICD-10-CM | POA: Diagnosis not present

## 2023-05-11 DIAGNOSIS — Z8249 Family history of ischemic heart disease and other diseases of the circulatory system: Secondary | ICD-10-CM | POA: Diagnosis not present

## 2023-05-11 DIAGNOSIS — E782 Mixed hyperlipidemia: Secondary | ICD-10-CM | POA: Diagnosis not present

## 2023-05-11 DIAGNOSIS — I10 Essential (primary) hypertension: Secondary | ICD-10-CM

## 2023-05-11 DIAGNOSIS — E785 Hyperlipidemia, unspecified: Secondary | ICD-10-CM | POA: Diagnosis not present

## 2023-05-11 DIAGNOSIS — M0579 Rheumatoid arthritis with rheumatoid factor of multiple sites without organ or systems involvement: Secondary | ICD-10-CM

## 2023-05-11 DIAGNOSIS — N1831 Chronic kidney disease, stage 3a: Secondary | ICD-10-CM | POA: Diagnosis not present

## 2023-05-11 DIAGNOSIS — I129 Hypertensive chronic kidney disease with stage 1 through stage 4 chronic kidney disease, or unspecified chronic kidney disease: Secondary | ICD-10-CM | POA: Diagnosis not present

## 2023-05-11 DIAGNOSIS — F411 Generalized anxiety disorder: Secondary | ICD-10-CM

## 2023-05-11 DIAGNOSIS — F32A Depression, unspecified: Secondary | ICD-10-CM | POA: Diagnosis not present

## 2023-05-11 DIAGNOSIS — N12 Tubulo-interstitial nephritis, not specified as acute or chronic: Secondary | ICD-10-CM | POA: Diagnosis not present

## 2023-05-11 DIAGNOSIS — E860 Dehydration: Secondary | ICD-10-CM | POA: Diagnosis not present

## 2023-05-11 DIAGNOSIS — E872 Acidosis, unspecified: Secondary | ICD-10-CM | POA: Diagnosis not present

## 2023-05-11 DIAGNOSIS — R9431 Abnormal electrocardiogram [ECG] [EKG]: Secondary | ICD-10-CM

## 2023-05-11 DIAGNOSIS — R6510 Systemic inflammatory response syndrome (SIRS) of non-infectious origin without acute organ dysfunction: Secondary | ICD-10-CM | POA: Diagnosis not present

## 2023-05-11 DIAGNOSIS — G43909 Migraine, unspecified, not intractable, without status migrainosus: Secondary | ICD-10-CM | POA: Diagnosis not present

## 2023-05-11 DIAGNOSIS — Z1152 Encounter for screening for COVID-19: Secondary | ICD-10-CM | POA: Diagnosis not present

## 2023-05-11 DIAGNOSIS — R4182 Altered mental status, unspecified: Secondary | ICD-10-CM | POA: Diagnosis not present

## 2023-05-11 DIAGNOSIS — K219 Gastro-esophageal reflux disease without esophagitis: Secondary | ICD-10-CM | POA: Diagnosis not present

## 2023-05-11 DIAGNOSIS — R410 Disorientation, unspecified: Secondary | ICD-10-CM | POA: Diagnosis not present

## 2023-05-11 DIAGNOSIS — Z87891 Personal history of nicotine dependence: Secondary | ICD-10-CM | POA: Diagnosis not present

## 2023-05-11 DIAGNOSIS — E669 Obesity, unspecified: Secondary | ICD-10-CM

## 2023-05-11 DIAGNOSIS — R590 Localized enlarged lymph nodes: Secondary | ICD-10-CM | POA: Diagnosis not present

## 2023-05-11 DIAGNOSIS — G9340 Encephalopathy, unspecified: Secondary | ICD-10-CM

## 2023-05-11 DIAGNOSIS — J189 Pneumonia, unspecified organism: Secondary | ICD-10-CM | POA: Diagnosis not present

## 2023-05-11 DIAGNOSIS — G9341 Metabolic encephalopathy: Secondary | ICD-10-CM | POA: Diagnosis not present

## 2023-05-11 DIAGNOSIS — Z888 Allergy status to other drugs, medicaments and biological substances status: Secondary | ICD-10-CM | POA: Diagnosis not present

## 2023-05-11 DIAGNOSIS — Z6835 Body mass index (BMI) 35.0-35.9, adult: Secondary | ICD-10-CM | POA: Diagnosis not present

## 2023-05-11 DIAGNOSIS — A419 Sepsis, unspecified organism: Secondary | ICD-10-CM | POA: Diagnosis not present

## 2023-05-11 LAB — URINALYSIS, ROUTINE W REFLEX MICROSCOPIC
Bacteria, UA: NONE SEEN
Bilirubin Urine: NEGATIVE
Glucose, UA: NEGATIVE mg/dL
Ketones, ur: NEGATIVE mg/dL
Leukocytes,Ua: NEGATIVE
Nitrite: NEGATIVE
Protein, ur: 100 mg/dL — AB
Specific Gravity, Urine: 1.012 (ref 1.005–1.030)
pH: 6 (ref 5.0–8.0)

## 2023-05-11 LAB — RAPID URINE DRUG SCREEN, HOSP PERFORMED
Amphetamines: NOT DETECTED
Barbiturates: NOT DETECTED
Benzodiazepines: NOT DETECTED
Cocaine: NOT DETECTED
Opiates: NOT DETECTED
Tetrahydrocannabinol: NOT DETECTED

## 2023-05-11 LAB — SEDIMENTATION RATE: Sed Rate: 14 mm/h (ref 0–22)

## 2023-05-11 LAB — RESPIRATORY PANEL BY PCR

## 2023-05-11 LAB — TSH: TSH: 0.414 u[IU]/mL (ref 0.350–4.500)

## 2023-05-11 LAB — RESP PANEL BY RT-PCR (RSV, FLU A&B, COVID)  RVPGX2
Influenza A by PCR: NEGATIVE
Influenza B by PCR: NEGATIVE
Resp Syncytial Virus by PCR: NEGATIVE
SARS Coronavirus 2 by RT PCR: NEGATIVE

## 2023-05-11 LAB — HIV ANTIBODY (ROUTINE TESTING W REFLEX): HIV Screen 4th Generation wRfx: NONREACTIVE

## 2023-05-11 LAB — CBG MONITORING, ED: Glucose-Capillary: 130 mg/dL — ABNORMAL HIGH (ref 70–99)

## 2023-05-11 LAB — LACTIC ACID, PLASMA: Lactic Acid, Venous: 1.1 mmol/L (ref 0.5–1.9)

## 2023-05-11 LAB — C-REACTIVE PROTEIN: CRP: 0.6 mg/dL (ref <1.0)

## 2023-05-11 MED ORDER — SODIUM CHLORIDE 0.9% FLUSH
3.0000 mL | Freq: Two times a day (BID) | INTRAVENOUS | Status: DC
  Administered 2023-05-11 – 2023-05-12 (×3): 3 mL via INTRAVENOUS

## 2023-05-11 MED ORDER — GABAPENTIN 300 MG PO CAPS
600.0000 mg | ORAL_CAPSULE | Freq: Three times a day (TID) | ORAL | Status: DC
  Administered 2023-05-11 – 2023-05-12 (×2): 600 mg via ORAL
  Filled 2023-05-11 (×2): qty 2

## 2023-05-11 MED ORDER — SODIUM CHLORIDE 0.9 % IV SOLN
2.0000 g | INTRAVENOUS | Status: DC
  Administered 2023-05-11: 2 g via INTRAVENOUS
  Filled 2023-05-11: qty 20

## 2023-05-11 MED ORDER — METOCLOPRAMIDE HCL 10 MG PO TABS
10.0000 mg | ORAL_TABLET | Freq: Two times a day (BID) | ORAL | Status: DC
  Administered 2023-05-12: 10 mg via ORAL
  Filled 2023-05-11: qty 1

## 2023-05-11 MED ORDER — DICLOFENAC SODIUM 1 % EX GEL
2.0000 g | Freq: Every day | CUTANEOUS | Status: DC
  Administered 2023-05-12: 2 g via TOPICAL
  Filled 2023-05-11: qty 100

## 2023-05-11 MED ORDER — POTASSIUM CHLORIDE CRYS ER 20 MEQ PO TBCR
40.0000 meq | EXTENDED_RELEASE_TABLET | ORAL | Status: AC
  Administered 2023-05-11: 40 meq via ORAL
  Filled 2023-05-11: qty 2

## 2023-05-11 MED ORDER — ENOXAPARIN SODIUM 40 MG/0.4ML IJ SOSY
40.0000 mg | PREFILLED_SYRINGE | INTRAMUSCULAR | Status: DC
  Administered 2023-05-11 – 2023-05-12 (×2): 40 mg via SUBCUTANEOUS
  Filled 2023-05-11 (×2): qty 0.4

## 2023-05-11 MED ORDER — ALPRAZOLAM 0.25 MG PO TABS
0.2500 mg | ORAL_TABLET | Freq: Two times a day (BID) | ORAL | Status: DC | PRN
  Administered 2023-05-11: 0.25 mg via ORAL
  Filled 2023-05-11: qty 1

## 2023-05-11 MED ORDER — LOSARTAN POTASSIUM 50 MG PO TABS
100.0000 mg | ORAL_TABLET | Freq: Every day | ORAL | Status: DC
  Administered 2023-05-12: 100 mg via ORAL
  Filled 2023-05-11: qty 2

## 2023-05-11 MED ORDER — PANTOPRAZOLE SODIUM 40 MG PO TBEC
40.0000 mg | DELAYED_RELEASE_TABLET | Freq: Two times a day (BID) | ORAL | Status: DC
  Administered 2023-05-11 – 2023-05-12 (×2): 40 mg via ORAL
  Filled 2023-05-11 (×2): qty 1

## 2023-05-11 MED ORDER — ATORVASTATIN CALCIUM 10 MG PO TABS
20.0000 mg | ORAL_TABLET | Freq: Every day | ORAL | Status: DC
  Administered 2023-05-12: 20 mg via ORAL
  Filled 2023-05-11: qty 2

## 2023-05-11 MED ORDER — TRAZODONE HCL 50 MG PO TABS
50.0000 mg | ORAL_TABLET | Freq: Every evening | ORAL | Status: DC | PRN
  Administered 2023-05-11: 50 mg via ORAL
  Filled 2023-05-11: qty 1

## 2023-05-11 MED ORDER — IOHEXOL 350 MG/ML SOLN
75.0000 mL | Freq: Once | INTRAVENOUS | Status: AC | PRN
  Administered 2023-05-11: 75 mL via INTRAVENOUS

## 2023-05-11 MED ORDER — ACETAMINOPHEN 650 MG RE SUPP: 650.0000 mg | Freq: Four times a day (QID) | RECTAL | Status: DC | PRN

## 2023-05-11 MED ORDER — VANCOMYCIN HCL 1250 MG/250ML IV SOLN
1250.0000 mg | INTRAVENOUS | Status: DC
  Administered 2023-05-12: 1250 mg via INTRAVENOUS
  Filled 2023-05-11 (×2): qty 250

## 2023-05-11 MED ORDER — CITALOPRAM HYDROBROMIDE 20 MG PO TABS
40.0000 mg | ORAL_TABLET | Freq: Every day | ORAL | Status: DC
  Administered 2023-05-12: 40 mg via ORAL
  Filled 2023-05-11: qty 2

## 2023-05-11 MED ORDER — ALBUTEROL SULFATE (2.5 MG/3ML) 0.083% IN NEBU: 2.5000 mg | INHALATION_SOLUTION | Freq: Four times a day (QID) | RESPIRATORY_TRACT | Status: DC | PRN

## 2023-05-11 MED ORDER — ACETAMINOPHEN 325 MG PO TABS
650.0000 mg | ORAL_TABLET | Freq: Four times a day (QID) | ORAL | Status: DC | PRN
  Administered 2023-05-11 (×2): 650 mg via ORAL
  Filled 2023-05-11 (×2): qty 2

## 2023-05-11 MED ORDER — HYDRALAZINE HCL 20 MG/ML IJ SOLN: 10.0000 mg | INTRAMUSCULAR | Status: DC | PRN

## 2023-05-11 MED ORDER — TRIMETHOBENZAMIDE HCL 100 MG/ML IM SOLN
200.0000 mg | Freq: Four times a day (QID) | INTRAMUSCULAR | Status: DC | PRN
  Filled 2023-05-11: qty 2

## 2023-05-11 MED ORDER — ACETAMINOPHEN 650 MG RE SUPP
650.0000 mg | RECTAL | Status: AC
  Administered 2023-05-11: 650 mg via RECTAL
  Filled 2023-05-11: qty 1

## 2023-05-11 MED ORDER — OXYCODONE-ACETAMINOPHEN 5-325 MG PO TABS
1.0000 | ORAL_TABLET | Freq: Three times a day (TID) | ORAL | Status: DC | PRN
  Administered 2023-05-12: 1 via ORAL
  Filled 2023-05-11 (×2): qty 1

## 2023-05-11 NOTE — Progress Notes (Signed)
 Pharmacy Antibiotic Note  Pamela [name] is a 68 y.o. female admitted on 05/10/2023 with sepsis.  Pharmacy has been consulted for vancomycin dosing.  Plan: Vancomycin 1250 mg IV q 24h (eAUC 506) Monitor renal function, Cx and clinical progression to narrow  Height: 5\' 8"  (172.7 cm) Weight: 107 kg (235 lb 14.3 oz) (Wt. 04/03/2023) IBW/kg (Calculated) : 63.9  Temp (24hrs), Avg:99 F (37.2 C), Min:97.9 F (36.6 C), Max:101.5 F (38.6 C)  Recent Labs  Lab 05/10/23 2138 05/10/23 2244 05/11/23 0131  WBC 14.2*  --   --   CREATININE 1.08*  --   --   LATICACIDVEN  --  2.0* 1.1    Estimated Creatinine Clearance: 63.8 mL/min (A) (by C-G formula based on SCr of 1.08 mg/dL (H)).    Allergies  Allergen Reactions   Cymbalta [Duloxetine Hcl] Other (See Comments)    Feeling weird    Daylene Posey, PharmD, [facility] Clinical Pharmacist ED Pharmacist Phone # [phone] 05/11/2023 8:34 PM

## 2023-05-11 NOTE — ED Notes (Signed)
 Patient transported to CT

## 2023-05-11 NOTE — H&P (Signed)
 History and Physical    Patient: Pamela [name]:[id number] DOB: 1955/11/06 DOA: 05/10/2023 DOS: the patient was seen and examined on 05/11/2023 PCP: Corwin Levins, MD  Patient coming from: Home  Chief Complaint:  Chief Complaint  Patient presents with   Altered Mental Status   HPI: Pamela [name] is a 68 y.o. female with medical history significant of hypertension, hyperlipidemia, renal calculus, anxiety, depression, and GERD presents after being noted to be acutely altered this morning.  History is obtained with the assistance of her family present at bedside.  Family found the patient shaking on the bed. Her daughter described her as not being herself and unable to engage in normal conversation.  It was reported that her blood pressure pills were found spilled on the floor, suggesting she may have attempted to take them, but was unable to do so properly. For approximately three days, she has not been eating or drinking normally. Despite this, she denies any difficulty with urination, discomfort when urinating, back pain, or vomiting. No cough or history of smoking. Her daughter observed slurred speech but no facial droop or focal weakness reported.   Upon admission into the emergency department patient was noted to be febrile up to 101.6 F with heart rates elevated to 121, respirations 27, blood pressures 123/94 to 172/85, and O2 saturations maintained currently maintained on room air.  Labs significant for WBC 14.2, potassium 3.1, CO2 21, BUN 10, creatinine 1.08, calcium 10.5, anion gap 17, acetaminophen and salicylate levels undetectable.  Venous blood gas did not note signs of hypercapnia.  Chest x-ray showed no acute abnormality.  Urinalysis no significant signs for infection.  Blood cultures have been obtained.  CT scan of the head did not note any acute abnormality.  CT of the abdomen pelvis was still pending.  Patient was given Tylenol 650 mg suppository, 1 L normal saline IV fluids,  vancomycin, metronidazole, and cefepime.  Review of Systems: As mentioned in the history of present illness. All other systems reviewed and are negative. Past Medical History:  Diagnosis Date   ALLERGIC RHINITIS 12/18/2006   Qualifier: Diagnosis of  By: Genelle Gather CMA, [place]     Anxiety    Arthritis    DEPRESSION, SITUATIONAL 03/15/2007   Qualifier: Diagnosis of  By: Nena Jordan    GERD (gastroesophageal reflux disease)    Headache    occasional   History of kidney stones    HYPERLIPIDEMIA 12/18/2006   Qualifier: History of  By: Genelle Gather CMA, [place]     Hypertension    HYPERTENSION 12/18/2006   Qualifier: Diagnosis of  By: Genelle Gather CMA, [place]     INSOMNIA-SLEEP DISORDER-UNSPEC 08/25/2009   Qualifier: Diagnosis of  By: Jonny Ruiz MD, Len Blalock    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 Charlann Boxer   Social History:  reports that she quit smoking about 55 years ago. Her smoking use included cigarettes. She started smoking about 58 years ago. She has a 0.8 pack-year smoking history. She has never been exposed to tobacco smoke. She has never used smokeless tobacco. She reports current alcohol use. She reports that she does not use drugs.  Allergies  Allergen Reactions   Cymbalta [Duloxetine Hcl] Other (See Comments)    Feeling weird    Family History  Problem Relation Age of Onset   Stroke Mother    Heart disease Father    Lung cancer Sister     Lung cancer Brother    Healthy Daughter    Healthy Daughter    Healthy Daughter    Colon cancer Neg Hx    Esophageal cancer Neg Hx    Pancreatic cancer Neg Hx    Stomach cancer Neg Hx     Prior to Admission medications   Medication Sig Start Date End Date Taking? Authorizing Provider  adalimumab (HUMIRA, 2 PEN,) 40 MG/0.4ML pen Inject 0.4 mLs (40 mg total) into the skin every 14 (fourteen) days. 12/28/22   Pollyann Savoy, MD  ALPRAZolam Prudy Feeler) 0.25 MG tablet TAKE 1 TABLET BY MOUTH TWICE A DAY AS NEEDED 02/01/23   Corwin Levins, MD  atorvastatin (LIPITOR) 20 MG tablet TAKE 1 TABLET BY MOUTH EVERY DAY 02/01/23   Corwin Levins, MD  citalopram (CELEXA) 40 MG tablet TAKE 1 TABLET BY MOUTH EVERY DAY 09/25/22   Corwin Levins, MD  cyclobenzaprine (FLEXERIL) 5 MG tablet TAKE 1 TABLET BY MOUTH THREE TIMES A DAY AS NEEDED FOR MUSCLE SPASM 03/12/23   Corwin Levins, MD  diclofenac Sodium (VOLTAREN ARTHRITIS PAIN) 1 % GEL Apply topically daily.    [provider]  gabapentin (NEURONTIN) 600 MG tablet Take 1 tablet (600 mg total) by mouth 3 (three) times daily. 12/25/22   Corwin Levins, MD  hydrochlorothiazide (MICROZIDE) 12.5 MG capsule TAKE 1 CAPSULE(12.5 MG) BY MOUTH DAILY 02/01/23   Corwin Levins, MD  losartan (COZAAR) 100 MG tablet TAKE 1 TABLET BY MOUTH EVERY DAY 02/01/23   Corwin Levins, MD  methotrexate (RHEUMATREX) 2.5 MG tablet Take 4 tablets (10 mg total) by mouth once a week. 03/15/23   Pollyann Savoy, MD  metoCLOPramide (REGLAN) 10 MG tablet TAKE 1 TABLET BY MOUTH TWICE A DAY IN THE MORNING AND IN THE EVENING 04/13/23   Unk Lightning, PA  Multiple Vitamin (MULTIVITAMIN WITH MINERALS) TABS tablet Take 1 tablet by mouth daily.    [provider]  oxyCODONE-acetaminophen (PERCOCET) 5-325 MG tablet Take 1 tablet by mouth every 6 (six) hours as needed for severe pain (pain score 7-10). 05/08/23   Fanny Dance, MD  pantoprazole (PROTONIX) 40 MG tablet TAKE 1 TABLET  BY MOUTH TWICE A DAY 04/13/23   Unk Lightning, PA  traMADol (ULTRAM) 50 MG tablet TAKE 1 TABLET BY MOUTH EVERY 6 HOURS AS NEEDED FOR PAIN 03/15/23   Corwin Levins, MD  traZODone (DESYREL) 50 MG tablet TAKE 1/2 TO 1 TABLET BY MOUTH AT BEDTIME AS NEEDED FOR SLEEP 12/11/22   Corwin Levins, MD  zolpidem (AMBIEN) 10 MG tablet TAKE 1 TABLET BY MOUTH EVERY DAY AT BEDTIME AS NEEDED 03/12/23   Corwin Levins, MD    Physical Exam: Vitals:   05/11/23 0025 05/11/23 0230 05/11/23 0349 05/11/23 0632  BP: (!) 123/94 (!) 149/84 (!) 154/77 (!) 142/71  Pulse: (!) 120 (!) 114 (!) 101 Marland Kitchen)  105  Resp: 20 (!) 21 20 18   Temp:  99.3 F (37.4 C)  98.5 F (36.9 C)  TempSrc:  Rectal  Oral  SpO2: 100% 99% 100% 100%  Weight:      Height:        Constitutional: Elderly female who appears to be in no acute distress Eyes: PERRL, lids and conjunctivae normal ENMT: Mucous membranes are moist. Posterior pharynx clear of any exudate or lesions.Normal dentition.  Neck: normal, supple,   Respiratory: clear to auscultation bilaterally, no wheezing, no crackles. Normal respiratory effort. No accessory muscle use.  Cardiovascular: Regular rate and rhythm, no murmurs / rubs / gallops. No extremity edema. 2+ pedal pulses. No carotid bruits.  Abdomen: no tenderness, no masses palpated. No hepatosplenomegaly. Bowel sounds positive.  Musculoskeletal: no clubbing / cyanosis.  Healed scar from prior left knee replacement Skin: no rashes, lesions, ulcers. No induration Neurologic: CN 2-12 grossly intact.  Able to move all extremities Psychiatric: Seems to be slightly confused.  Alert and oriented to person and place  Data Reviewed:  EKG reveals sinus tachycardia 135 bpm with QTc 579.  Reviewed labs, imaging, and pertinent records as documented.  Assessment and Plan:  SIRS/Sepsis Possible pyelonephritis Patient presented with fever up to 101.5 F with tachycardia, tachypnea, white blood cell count of 14.2.  Initial lactic  acid elevated at 2.  Blood  and urine cultures were obtained.  Patient had been given bolus IV fluids, vancomycin, Rocephin, and metronidazole.  Urinalysis noted small hemoglobin with no bacteria seen, and no other significant signs for infection.  CT scan of the chest abdomen pelvis was obtained which noted equivocal findings of mild pyelonephritis in the left kidney. -Admit to a telemetry bed -Follow-up blood and urine cultures -Check complete respiratory virus panel -Check ESR and CRP -Antibiotics changed to Rocephin IV.  Adjust antibiotics as needed -Acetaminophen as needed for fever -Recheck CBC in a.m.  Acute encephalopathy Patient was reported to be acutely altered yesterday with reports of slurred speech.  Acetaminophen, salicylate, and ammonia level was within normal limits. No nuchal rigidity appreciated on physical exam.  CT scan of the head did not note any acute abnormality.  Currently noted some improvement since being in the hospital.  Initially question polypharmacy, but UDS was negative despite patient having prescriptions for oxycodone and Ativan.  Suspects possibly secondary to underlying infection vs.  possibility of seizure abruptly stopped taking Ativan vs.TIA/stroke. -Neurochecks -Add on TSH -Rocephin IV -May warrant further imaging with MRI if symptoms persist  Hypercalcemia Acute.  Calcium elevated at 10.5.  Calcium level mildly elevated and have been noted to be mildly elevated previously on blood work from 02/2023.  Patient had been given IV fluids. -Hold any calcium supplements -Recheck calcium levels in a.m.  Hypokalemia Acute.  Initial potassium noted to be 3.3. -Give 40 meq of potassium chloride p.o. -Continue to monitor and replace as needed  Metabolic acidosis with increased anion gap Acute.  CO2 was noted to be 21 with anion gap of 17.  Unclear cause at this time. -Recheck BMP in a.m.  Prolonged QT interval QTc noted to be prolonged at 579. -Correct  electrolyte abnormalities -Avoid additional QT prolonging medications  Decreased p.o. intake Acute.  Family note that patient had not been eating and drinking well over the last couple of days prior to coming into the hospital.  She is normally on Reglan, but family did not report any nausea and vomiting.  Essential hypertension On admission blood pressures  ranged from 123/94 to 160/115. -Held hydrochlorothiazide due to reports of decreased p.o. intake -Continue losartan  Chronic kidney disease stage IIIa Creatinine noted to be 1.08.  Baseline creatinine appears to range from 0.8-1. -Continue to monitor  Anxiety -Continue low-dose Ativan as needed  Rheumatoid arthritis Patient is on Humira.  Hyperlipidemia -Continue atorvastatin  Left axillary lymph node Acute.  CT imaging of the chest noted a focal enlarged left axillary lymph node measuring 3.3 cm.  Unclear cause of left axillary enlargement. -May warrant further workup and investigation   Obesity BMI 35.87 kg/m  GERD -Continue Protonix   DVT prophylaxis: Lovenox Advance Care Planning:   Code Status: Full Code   Consults: None  Family Communication: Husband and patient's daughter updated at bedside  Severity of Illness: The appropriate patient status for this patient is INPATIENT. Inpatient status is judged to be reasonable and necessary in order to provide the required intensity of service to ensure the patient's safety. The patient's presenting symptoms, physical exam findings, and initial radiographic and laboratory data in the context of their chronic comorbidities is felt to place them at high risk for further clinical deterioration. Furthermore, it is not anticipated that the patient will be medically stable for discharge from the hospital within 2 midnights of admission.   * I certify that at the point of admission it is my clinical judgment that the patient will require inpatient hospital care spanning beyond  2 midnights from the point of admission due to high intensity of service, high risk for further deterioration and high frequency of surveillance required.*  Author: Clydie Braun, MD 05/11/2023 7:11 AM  For on call review [email or web address].

## 2023-05-11 NOTE — ED Notes (Signed)
 Patient used bedpan sheets wet patient cleaned and new linen  applied.

## 2023-05-11 NOTE — ED Notes (Signed)
 Pt has returned from CT.

## 2023-05-11 NOTE — Plan of Care (Signed)

## 2023-05-12 ENCOUNTER — Other Ambulatory Visit (HOSPITAL_COMMUNITY): Payer: Self-pay

## 2023-05-12 DIAGNOSIS — A419 Sepsis, unspecified organism: Principal | ICD-10-CM

## 2023-05-12 DIAGNOSIS — R6510 Systemic inflammatory response syndrome (SIRS) of non-infectious origin without acute organ dysfunction: Secondary | ICD-10-CM | POA: Diagnosis not present

## 2023-05-12 LAB — URINE CULTURE: Culture: NO GROWTH

## 2023-05-12 LAB — BASIC METABOLIC PANEL
Anion gap: 7 (ref 5–15)
BUN: 9 mg/dL (ref 8–23)
CO2: 26 mmol/L (ref 22–32)
Calcium: 9.9 mg/dL (ref 8.9–10.3)
Chloride: 106 mmol/L (ref 98–111)
Creatinine, Ser: 0.74 mg/dL (ref 0.44–1.00)
GFR, Estimated: >60 mL/min (ref >60)
Glucose, Bld: 102 mg/dL — ABNORMAL HIGH (ref 70–99)
Potassium: 3.3 mmol/L — ABNORMAL LOW (ref 3.5–5.1)
Sodium: 139 mmol/L (ref 135–145)

## 2023-05-12 LAB — CBC
HCT: 33.2 % — ABNORMAL LOW (ref 36.0–46.0)
Hemoglobin: 11.4 g/dL — ABNORMAL LOW (ref 12.0–15.0)
MCH: 28.2 pg (ref 26.0–34.0)
MCHC: 34.3 g/dL (ref 30.0–36.0)
MCV: 82.2 fL (ref 80.0–100.0)
Platelets: 212 10*3/uL (ref 150–400)
RBC: 4.04 MIL/uL (ref 3.87–5.11)
RDW: 13.4 % (ref 11.5–15.5)
WBC: 6.5 10*3/uL (ref 4.0–10.5)
nRBC: 0 % (ref 0.0–0.2)

## 2023-05-12 LAB — MAGNESIUM: Magnesium: 1.6 mg/dL — ABNORMAL LOW (ref 1.7–2.4)

## 2023-05-12 LAB — PROCALCITONIN: Procalcitonin: <0.1 ng/mL

## 2023-05-12 MED ORDER — ALBUTEROL SULFATE HFA 108 (90 BASE) MCG/ACT IN AERS
2.0000 | INHALATION_SPRAY | Freq: Four times a day (QID) | RESPIRATORY_TRACT | 2 refills | Status: DC | PRN
  Filled 2023-05-12: qty 6.7, 25d supply, fill #0

## 2023-05-12 MED ORDER — KETOROLAC TROMETHAMINE 30 MG/ML IJ SOLN
30.0000 mg | Freq: Once | INTRAMUSCULAR | Status: AC
  Administered 2023-05-12: 30 mg via INTRAVENOUS
  Filled 2023-05-12: qty 1

## 2023-05-12 MED ORDER — CEPHALEXIN 500 MG PO CAPS
500.0000 mg | ORAL_CAPSULE | Freq: Three times a day (TID) | ORAL | 0 refills | Status: DC
  Filled 2023-05-12: qty 15, 5d supply, fill #0

## 2023-05-12 MED ORDER — AZITHROMYCIN 500 MG PO TABS
500.0000 mg | ORAL_TABLET | Freq: Every day | ORAL | 0 refills | Status: DC
  Filled 2023-05-12: qty 5, 5d supply, fill #0

## 2023-05-12 MED ORDER — DOXYCYCLINE HYCLATE 100 MG PO TABS
100.0000 mg | ORAL_TABLET | Freq: Two times a day (BID) | ORAL | 0 refills | Status: DC
  Filled 2023-05-12: qty 10, 5d supply, fill #0

## 2023-05-12 MED ORDER — MAGNESIUM SULFATE 4 GM/100ML IV SOLN
4.0000 g | Freq: Once | INTRAVENOUS | Status: AC
  Administered 2023-05-12: 4 g via INTRAVENOUS
  Filled 2023-05-12: qty 100

## 2023-05-12 MED ORDER — POTASSIUM CHLORIDE CRYS ER 20 MEQ PO TBCR
40.0000 meq | EXTENDED_RELEASE_TABLET | Freq: Once | ORAL | Status: AC
  Administered 2023-05-12: 40 meq via ORAL
  Filled 2023-05-12: qty 2

## 2023-05-12 MED ORDER — CEPHALEXIN 500 MG PO CAPS
500.0000 mg | ORAL_CAPSULE | Freq: Three times a day (TID) | ORAL | 0 refills | Status: AC
  Filled 2023-05-12: qty 21, 7d supply, fill #0

## 2023-05-12 NOTE — Discharge Summary (Addendum)
 Pamela [name]:[id number] DOB: 03-Apr-1955 DOA: 05/10/2023  PCP: Corwin Levins, MD  Admit date: 05/10/2023  Discharge date: 05/12/2023  Admitted From: Home   Disposition:  Home   Recommendations for Outpatient Follow-up:   Follow up with PCP in 1-2 weeks  PCP Please obtain BMP/CBC, 2 view CXR in 1week,  (see Discharge instructions)   PCP Please follow up on the following pending results: Follow final pending culture results, review CT scan findings of axillary lymphadenopathy, may require biopsy.   Home Health: None Equipment/Devices:   Consultations: None  Discharge Condition: Stable    CODE STATUS: Full    Diet Recommendation: Heart Healthy     Chief Complaint  Patient presents with   Altered Mental Status     Brief history of present illness from the day of admission and additional interim summary    68 y.o. female with medical history significant of hypertension, hyperlipidemia, renal calculus, anxiety, depression, and GERD presents after being noted to be acutely altered this morning.  History is obtained with the assistance of her family present at bedside.   Family found the patient shaking on the bed. Her daughter described her as not being herself and unable to engage in normal conversation.  It was reported that her blood pressure pills were found spilled on the floor, suggesting she may have attempted to take them, but was unable to do so properly. For approximately three days, she has not been eating or drinking normally. Despite this, she denies any difficulty with urination, discomfort when urinating, back pain, or vomiting. No cough or history of smoking. Her daughter observed slurred speech but no facial droop or focal weakness reported.  The ER she was found to have pneumonia with mild  encephalopathy and admitted to the hospital.  CT head was unremarkable.                                                                  Hospital Course   SIRS/Sepsis Possible pyelonephritis with possible mild community-acquired pneumonia CT scan question for left-sided pyelonephritis, responded very well to empiric antibiotics, sepsis pathophysiology completely resolved, mentation back to baseline, eager to go home.  Will be placed on 7 days of antibiotics for both pyelonephritis and pneumonia, she is currently symptom-free with mild dry cough, no flank pain or discomfort.  No dysuria.  Cultures pending request PCP to monitor next visit.   Acute encephalopathy with mild ongoing headache. Due to #1 above, CT head nonacute, no focal deficits, says she gets headache due to migraine, encephalopathy much improved gave her Toradol this morning and now after an hour of it says her symptoms have completely resolved and she is feeling great, ate her breakfast, going to walk with PT still eager to be discharged home.  Will  have family also come and evaluate her.  Talked to daughter Mya.   Hypercalcemia Due to dehydration resolved PCP to monitor   Hypokalemia and hypomagnesemia Replaced   Metabolic acidosis with increased anion gap Due to sepsis and hypoperfusion. Resolved  after IV fluids.   Prolonged QT interval QTc noted to be prolonged at 579. resolved.  430 ms on telemetry on 05/12/2023 at 9:05 AM.   Decreased p.o. intake Resolved.   Essential hypertension Renew home medication follow-up with PCP   Chronic kidney disease stage IIIa Creatinine noted to be 1.08.  Baseline creatinine appears to range from 0.8-1. -Continue to monitor   Anxiety -Continue low-dose Ativan as needed   Rheumatoid arthritis Patient is on Humira.   Hyperlipidemia -Continue atorvastatin   Left axillary lymph node Acute.  CT imaging of the chest noted a focal enlarged left axillary lymph node measuring 3.3  cm.  Unclear cause of left axillary enlargement. -May warrant further workup and investigation, PCP to monitor     Obesity BMI 35.87 kg/m - follow with PCP   GERD -Continue Protonix   Discharge diagnosis     Principal Problem:   SIRS (systemic inflammatory response syndrome) (HCC) Active Problems:   Pyelonephritis   Acute encephalopathy   Hypercalcemia   Hypokalemia   Metabolic acidosis   QT prolongation   Essential hypertension   Chronic kidney disease, stage 3a (HCC)   Anxiety state   Rheumatoid arthritis involving multiple sites with positive rheumatoid factor (HCC)   HLD (hyperlipidemia)   Obesity (BMI 30-39.9)    Discharge instructions    Discharge Instructions     Discharge instructions   Complete by: As directed    Follow with Primary MD Corwin Levins, MD in 5 days, review your final culture results next visit.  Get CBC, CMP, 2 view Chest X ray -  checked next visit with your primary MD    Activity: As tolerated with Full fall precautions use walker/cane & assistance as needed  Disposition Home    Diet: Heart Healthy   Special Instructions: If you have smoked or chewed Tobacco  in the last 2 yrs please stop smoking, stop any regular Alcohol  and or any Recreational drug use.  On your next visit with your primary care physician please Get Medicines reviewed and adjusted.  Please request your Prim.MD to go over all Hospital Tests and Procedure/Radiological results at the follow up, please get all Hospital records sent to your Prim MD by signing hospital release before you go home.  If you experience worsening of your admission symptoms, develop shortness of breath, life threatening emergency, suicidal or homicidal thoughts you must seek medical attention immediately by calling 911 or calling your MD immediately  if symptoms less severe.  You Must read complete instructions/literature along with all the possible adverse reactions/side effects for all the  Medicines you take and that have been prescribed to you. Take any new Medicines after you have completely understood and accpet all the possible adverse reactions/side effects.   Do not drive when taking Pain medications.  Do not take more than prescribed Pain, Sleep and Anxiety Medications   Increase activity slowly   Complete by: As directed        Discharge Medications   Allergies as of 05/12/2023       Reactions   Cymbalta [duloxetine Hcl] Other (See Comments)   Feeling weird        Medication List     TAKE these medications  albuterol 108 (90 Base) MCG/ACT inhaler Commonly known as: VENTOLIN HFA Inhale 2 puffs into the lungs every 6 (six) hours as needed for wheezing or shortness of breath.   ALPRAZolam 0.25 MG tablet Commonly known as: XANAX TAKE 1 TABLET BY MOUTH TWICE A DAY AS NEEDED   atorvastatin 20 MG tablet Commonly known as: LIPITOR TAKE 1 TABLET BY MOUTH EVERY DAY   cephALEXin 500 MG capsule Commonly known as: KEFLEX Take 1 capsule (500 mg total) by mouth 3 (three) times daily for 7 days.   citalopram 40 MG tablet Commonly known as: CELEXA TAKE 1 TABLET BY MOUTH EVERY DAY   cyclobenzaprine 5 MG tablet Commonly known as: FLEXERIL TAKE 1 TABLET BY MOUTH THREE TIMES A DAY AS NEEDED FOR MUSCLE SPASM   doxycycline 100 MG capsule Commonly known as: VIBRAMYCIN Take 1 capsule (100 mg total) by mouth 2 (two) times daily.   gabapentin 600 MG tablet Commonly known as: NEURONTIN Take 1 tablet (600 mg total) by mouth 3 (three) times daily.   Humira (2 Pen) 40 MG/0.4ML pen Generic drug: adalimumab Inject 0.4 mLs (40 mg total) into the skin every 14 (fourteen) days.   hydrochlorothiazide 12.5 MG capsule Commonly known as: MICROZIDE TAKE 1 CAPSULE(12.5 MG) BY MOUTH DAILY   losartan 100 MG tablet Commonly known as: COZAAR TAKE 1 TABLET BY MOUTH EVERY DAY   methotrexate 2.5 MG tablet Commonly known as: RHEUMATREX Take 4 tablets (10 mg total) by mouth  once a week.   metoCLOPramide 10 MG tablet Commonly known as: REGLAN TAKE 1 TABLET BY MOUTH TWICE A DAY IN THE MORNING AND IN THE EVENING What changed: See the new instructions.   multivitamin with minerals Tabs tablet Take 1 tablet by mouth daily.   oxyCODONE-acetaminophen 5-325 MG tablet Commonly known as: Percocet Take 1 tablet by mouth every 6 (six) hours as needed for severe pain (pain score 7-10).   pantoprazole 40 MG tablet Commonly known as: PROTONIX TAKE 1 TABLET BY MOUTH TWICE A DAY   traMADol 50 MG tablet Commonly known as: ULTRAM TAKE 1 TABLET BY MOUTH EVERY 6 HOURS AS NEEDED FOR PAIN   traZODone 50 MG tablet Commonly known as: DESYREL TAKE 1/2 TO 1 TABLET BY MOUTH AT BEDTIME AS NEEDED FOR SLEEP   Voltaren Arthritis Pain 1 % Gel Generic drug: diclofenac Sodium Apply topically daily.   zolpidem 10 MG tablet Commonly known as: AMBIEN TAKE 1 TABLET BY MOUTH EVERY DAY AT BEDTIME AS NEEDED         Follow-up Information     Corwin Levins, MD. Schedule an appointment as soon as possible for a visit in 5 day(s).   Specialties: Internal Medicine, Radiology Contact information: [address] [place] [address] [phone]                 Major procedures and Radiology Reports - PLEASE review detailed and final reports thoroughly  -        CT CHEST ABDOMEN PELVIS W CONTRAST Result Date: 05/11/2023 CLINICAL DATA:  Sepsis EXAM: CT CHEST, ABDOMEN, AND PELVIS WITH CONTRAST TECHNIQUE: Multidetector CT imaging of the chest, abdomen and pelvis was performed following the standard protocol during bolus administration of intravenous contrast. RADIATION DOSE REDUCTION: This exam was performed according to the departmental dose-optimization program which includes automated exposure control, adjustment of the mA and/or kV according to patient size and/or use of iterative reconstruction technique. CONTRAST:  75mL OMNIPAQUE IOHEXOL 350 MG/ML SOLN COMPARISON:   03/09/2023  FINDINGS: CT CHEST FINDINGS Cardiovascular: Normal heart size. No pericardial effusion. Atheromatous calcification of the aorta and coronaries. Mediastinum/Nodes: No mass or adenopathy. 3.3 cm long enlarged lymph node at the left axilla, solitary. Lungs/Pleura: Bilateral lower lobe scarring along osteophytes and in the lateral right lower lobe, stable from most recent CT. On subtracted views of the lungs there are some pseudo nodules localizing to tortuous vessels at the lung bases and hila. There is no edema, consolidation, effusion, or pneumothorax. Musculoskeletal: Generalized thoracic spine degeneration with multilevel disc collapse and endplate spurring. No acute finding. CT ABDOMEN PELVIS FINDINGS Hepatobiliary: No focal liver abnormality.No evidence of biliary obstruction or stone. Pancreas: Unremarkable. Spleen: Unremarkable. Adrenals/Urinary Tract: Negative adrenals. Some streak and motion artifact affects left renal cortical homogeneity, there is some question of hypoenhancing left renal cortex as seen with pyelonephritis but less convincing on the delayed phase. Advanced right renal atrophy. Small bilateral renal cystic densities which are non worrisome. Unremarkable bladder. Stomach/Bowel:  No obstruction. No visible bowel inflammation. Vascular/Lymphatic: No acute vascular abnormality. Scattered atheromatous calcification. No mass or adenopathy. Reproductive:No pathologic findings. Other: No ascites or pneumoperitoneum. Musculoskeletal: No acute abnormalities. Scoliosis and advanced, generalized degeneration. IMPRESSION: 1. Equivocal findings of mild pyelonephritis in the left kidney. Advanced right renal atrophy. No hydronephrosis. 2. Focal enlarged left axillary lymph node at 3.3 cm in length. Recommend follow-up for consideration of biopsy. Electronically Signed   By: Tiburcio Pea M.D.   On: 05/11/2023 05:06   CT HEAD WO CONTRAST Result Date: 05/11/2023 CLINICAL DATA:  Altered mental  status EXAM: CT HEAD WITHOUT CONTRAST TECHNIQUE: Contiguous axial images were obtained from the base of the skull through the vertex without intravenous contrast. RADIATION DOSE REDUCTION: This exam was performed according to the departmental dose-optimization program which includes automated exposure control, adjustment of the mA and/or kV according to patient size and/or use of iterative reconstruction technique. COMPARISON:  03/22/2023 FINDINGS: Brain: No evidence of acute infarction, hemorrhage, hydrocephalus, extra-axial collection or mass lesion/mass effect. Vascular: No hyperdense vessel or unexpected calcification. Skull: Normal. Negative for fracture or focal lesion. Sinuses/Orbits: No acute finding. Other: None. IMPRESSION: No acute intracranial abnormality noted. Electronically Signed   By: Alcide Clever M.D.   On: 05/11/2023 01:23   DG Chest Port 1 View Result Date: 05/10/2023 CLINICAL DATA:  Altered mental status EXAM: PORTABLE CHEST 1 VIEW COMPARISON:  03/29/2021 FINDINGS: Shallow inspiration. Heart size and pulmonary vascularity are normal for technique. Lungs are clear. No pleural effusion or pneumothorax. Mediastinal contours appear intact. IMPRESSION: No active disease. Electronically Signed   By: Burman Nieves M.D.   On: 05/10/2023 23:23    Micro Results     Recent Results (from the past 240 hours)  Blood Culture (Routine X 2)     Status: None (Preliminary result)   Collection Time: 05/10/23  9:38 PM   Specimen: BLOOD LEFT HAND  Result Value Ref Range Status   Specimen Description BLOOD LEFT HAND  Final   Special Requests   Final    BOTTLES DRAWN AEROBIC AND ANAEROBIC Blood Culture results may not be optimal due to an inadequate volume of blood received in culture bottles   Culture   Final    NO GROWTH 2 DAYS Performed at [facility], [address] [place], [place] [address]    Report Status PENDING  Incomplete  Blood Culture (Routine X 2)     Status: None  (Preliminary result)   Collection Time: 05/10/23 10:44 PM   Specimen:  BLOOD RIGHT HAND  Result Value Ref Range Status   Specimen Description BLOOD RIGHT HAND  Final   Special Requests   Final    BOTTLES DRAWN AEROBIC AND ANAEROBIC Blood Culture adequate volume   Culture   Final    NO GROWTH 2 DAYS Performed at [facility], [address] [place], [place] [address]    Report Status PENDING  Incomplete  Resp panel by RT-PCR (RSV, Flu A&B, Covid) Anterior Nasal Swab     Status: None   Collection Time: 05/10/23 11:50 PM   Specimen: Anterior Nasal Swab  Result Value Ref Range Status   SARS Coronavirus 2 by RT PCR NEGATIVE NEGATIVE Final   Influenza A by PCR NEGATIVE NEGATIVE Final   Influenza B by PCR NEGATIVE NEGATIVE Final    Comment: (NOTE) The Xpert Xpress SARS-CoV-2/FLU/RSV plus assay is intended as an aid in the diagnosis of influenza from Nasopharyngeal swab specimens and should not be used as a sole basis for treatment. Nasal washings and aspirates are unacceptable for Xpert Xpress SARS-CoV-2/FLU/RSV testing.  Fact Sheet for Patients: BloggerCourse.com  Fact Sheet for Healthcare Providers: SeriousBroker.it  This test is not yet approved or cleared by the [place] FDA and has been authorized for detection and/or diagnosis of SARS-CoV-2 by FDA under an Emergency Use Authorization (EUA). This EUA will remain in effect (meaning this test can be used) for the duration of the COVID-19 declaration under Section 564(b)(1) of the Act, 21 U.S.C. section 360bbb-3(b)(1), unless the authorization is terminated or revoked.     Resp Syncytial Virus by PCR NEGATIVE NEGATIVE Final    Comment: (NOTE) Fact Sheet for Patients: BloggerCourse.com  Fact Sheet for Healthcare Providers: SeriousBroker.it  This test is not yet approved or cleared by the [place] FDA and has  been authorized for detection and/or diagnosis of SARS-CoV-2 by FDA under an Emergency Use Authorization (EUA). This EUA will remain in effect (meaning this test can be used) for the duration of the COVID-19 declaration under Section 564(b)(1) of the Act, 21 U.S.C. section 360bbb-3(b)(1), unless the authorization is terminated or revoked.  Performed at [facility], [address] [place], [place] [address]   Respiratory (~20 pathogens) panel by PCR     Status: None   Collection Time: 05/11/23  8:25 AM   Specimen: Nasopharyngeal Swab; Respiratory  Result Value Ref Range Status   Adenovirus NOT DETECTED NOT DETECTED Final   Coronavirus 229E NOT DETECTED NOT DETECTED Final    Comment: (NOTE) The Coronavirus on the Respiratory Panel, DOES NOT test for the novel  Coronavirus (2019 nCoV)    Coronavirus HKU1 NOT DETECTED NOT DETECTED Final   Coronavirus NL63 NOT DETECTED NOT DETECTED Final   Coronavirus OC43 NOT DETECTED NOT DETECTED Final   Metapneumovirus NOT DETECTED NOT DETECTED Final   Rhinovirus / Enterovirus NOT DETECTED NOT DETECTED Final   Influenza A NOT DETECTED NOT DETECTED Final   Influenza B NOT DETECTED NOT DETECTED Final   Parainfluenza Virus 1 NOT DETECTED NOT DETECTED Final   Parainfluenza Virus 2 NOT DETECTED NOT DETECTED Final   Parainfluenza Virus 3 NOT DETECTED NOT DETECTED Final   Parainfluenza Virus 4 NOT DETECTED NOT DETECTED Final   Respiratory Syncytial Virus NOT DETECTED NOT DETECTED Final   Bordetella pertussis NOT DETECTED NOT DETECTED Final   Bordetella Parapertussis NOT DETECTED NOT DETECTED Final   Chlamydophila pneumoniae NOT DETECTED NOT DETECTED Final   Mycoplasma pneumoniae NOT DETECTED NOT DETECTED Final  Comment: Performed at [facility], [address], [place], [place] [address]    Today   Subjective    Marija Calamari today has mild ongoing headache,no chest abdominal pain,no new weakness tingling or numbness, feels much  better wants to go home today.     Objective   Blood pressure 116/71, pulse 86, temperature 98.9 F (37.2 C), temperature source Oral, resp. rate 18, height 5\' 8"  (1.727 m), weight 107 kg, SpO2 95%.   Intake/Output Summary (Last 24 hours) at 05/12/2023 0901 Last data filed at 05/11/2023 2030 Gross per 24 hour  Intake 3 ml  Output --  Net 3 ml    Exam  Awake with minimal confusion, No new F.N deficits,    [place].AT,PERRAL Supple Neck,   Symmetrical Chest wall movement, Good air movement bilaterally, CTAB RRR,No Gallops,   +ve B.Sounds, Abd Soft, Non tender,  No Cyanosis, Clubbing or edema    Data Review   Recent Labs  Lab 05/10/23 2138 05/10/23 2258 05/12/23 0530  WBC 14.2*  --  6.5  HGB 12.4 12.6 11.4*  HCT 36.2 37.0 33.2*  PLT 249  --  212  MCV 82.1  --  82.2  MCH 28.1  --  28.2  MCHC 34.3  --  34.3  RDW 13.3  --  13.4  LYMPHSABS 1.1  --   --   MONOABS 1.1*  --   --   EOSABS 0.0  --   --   BASOSABS 0.1  --   --     Recent Labs  Lab 05/10/23 2138 05/10/23 2244 05/10/23 2258 05/11/23 0131 05/11/23 2236 05/12/23 0530 05/12/23 0535  NA 135  --  137  --   --  139  --   K 3.1*  --  3.3*  --   --  3.3*  --   CL 97*  --   --   --   --  106  --   CO2 21*  --   --   --   --  26  --   ANIONGAP 17*  --   --   --   --  7  --   GLUCOSE 127*  --   --   --   --  102*  --   BUN 10  --   --   --   --  9  --   CREATININE 1.08*  --   --   --   --  0.74  --   AST 26  --   --   --   --   --   --   ALT 17  --   --   --   --   --   --   ALKPHOS 51  --   --   --   --   --   --   BILITOT 0.9  --   --   --   --   --   --   ALBUMIN 4.2  --   --   --   --   --   --   CRP  --   --   --   --  0.6  --   --   PROCALCITON  --   --   --   --   --   --  <0.10  LATICACIDVEN  --  2.0*  --  1.1  --   --   --   INR  1.1  --   --   --   --   --   --   TSH  --   --   --   --  0.414  --   --   AMMONIA 28  --   --   --   --   --   --   MG  --   --   --   --   --   --  1.6*  CALCIUM 10.5*  --    --   --   --  9.9  --     Total Time in preparing paper work, data evaluation and todays exam - 35 minutes  Signature  -    Susa Raring M.D on 05/12/2023 at 9:01 AM   -  To page go to [email or web address]

## 2023-05-12 NOTE — Discharge Instructions (Addendum)
 Follow with Primary MD Corwin Levins, MD in 5 days, review your final culture results next visit.  Get CBC, CMP, 2 view Chest X ray -  checked next visit with your primary MD    Activity: As tolerated with Full fall precautions use walker/cane & assistance as needed  Disposition Home    Diet: Heart Healthy   Special Instructions: If you have smoked or chewed Tobacco  in the last 2 yrs please stop smoking, stop any regular Alcohol  and or any Recreational drug use.  On your next visit with your primary care physician please Get Medicines reviewed and adjusted.  Please request your Prim.MD to go over all Hospital Tests and Procedure/Radiological results at the follow up, please get all Hospital records sent to your Prim MD by signing hospital release before you go home.  If you experience worsening of your admission symptoms, develop shortness of breath, life threatening emergency, suicidal or homicidal thoughts you must seek medical attention immediately by calling 911 or calling your MD immediately  if symptoms less severe.  You Must read complete instructions/literature along with all the possible adverse reactions/side effects for all the Medicines you take and that have been prescribed to you. Take any new Medicines after you have completely understood and accpet all the possible adverse reactions/side effects.   Do not drive when taking Pain medications.  Do not take more than prescribed Pain, Sleep and Anxiety Medications

## 2023-05-12 NOTE — Evaluation (Signed)
 Physical Therapy Evaluation Patient Details Name: Pamela [name] MRN: [id number] DOB: 10-25-1955 Today's Date: 05/12/2023  History of Present Illness  68 y.o. female presents 2/27 after being noted to be acutely altered. SIRS/Sepsis, Possible pyelonephritis. Past medical history significant of hypertension, hyperlipidemia, renal calculus, anxiety, depression, and GERD.  Clinical Impression  Pt admitted with above diagnosis. Ambulates 80 feet with RW for support, stable with device. Cues for use, symmetry and symptom awareness. HR to 120s, back to 100bpm at rest. SpO2 98% on RA. Pt oriented to self and location (aware of year but not month, nor situation.) Spoke with husband Pamela [name] over the phone to provide update, states he feels confident that family can provided adequate support at home - recommended HHPT and RW use, agreeable.  Pt currently with functional limitations due to the deficits listed below (see PT Problem List). Pt will benefit from acute skilled PT to increase their independence and safety with mobility to allow discharge.           If plan is discharge home, recommend the following: A little help with walking and/or transfers;A little help with bathing/dressing/bathroom;Supervision due to cognitive status;Help with stairs or ramp for entrance;Assist for transportation;Assistance with cooking/housework   Can travel by private vehicle        Equipment Recommendations None recommended by PT  Recommendations for Other Services       Functional Status Assessment Patient has had a recent decline in their functional status and demonstrates the ability to make significant improvements in function in a reasonable and predictable amount of time.     Precautions / Restrictions Precautions Precautions: Fall Recall of Precautions/Restrictions: Impaired Restrictions Weight Bearing Restrictions Per Provider Order: No      Mobility  Bed Mobility Overal bed mobility: Needs  Assistance Bed Mobility: Supine to Sit     Supine to sit: Supervision     General bed mobility comments: Supervision for safety, assisted with line/lead management, able to rise without physical help.    Transfers Overall transfer level: Needs assistance Equipment used: Rolling walker (2 wheels) Transfers: Sit to/from Stand Sit to Stand: Supervision           General transfer comment: Supervision for safety to stand, rocks for momentum but good power up and stabilized quickly with RW for support. Cues for set up and hand placement.    Ambulation/Gait Ambulation/Gait assistance: Supervision Gait Distance (Feet): 80 Feet Assistive device: Rolling walker (2 wheels) Gait Pattern/deviations: Step-through pattern, Decreased stride length Gait velocity: dec Gait velocity interpretation: <1.8 ft/sec, indicate of risk for recurrent falls   General Gait Details: Stable with RW for support, cues for gait symmetry and pacing, no overt LOB noted. Fatigued towards end of distance; SpO2 98% on RA, HR into 120s, back to 100 at rest.  Stairs            Wheelchair Mobility     Tilt Bed    Modified Rankin (Stroke Patients Only)       Balance                                             Pertinent Vitals/Pain Pain Assessment Pain Assessment: No/denies pain    Home Living Family/patient expects to be discharged to:: Private residence Living Arrangements: Spouse/significant other Available Help at Discharge: Family;Available PRN/intermittently Type of Home: House Home Access: Stairs to enter Entrance  Stairs-Rails: Left Entrance Stairs-Number of Steps: 4   Home Layout: One level Home Equipment: Agricultural consultant (2 wheels) Additional Comments: Pt states husband is unwell, she cares for him alongside with daughters assistance; she would like an aide. She is disoriented today so information may not be accurate.    Prior Function Prior Level of Function :  Independent/Modified Independent;History of Falls (last six months)             Mobility Comments: Reports ind but has been falling (syncope?) ADLs Comments: ind, assists with husbands care     Extremity/Trunk Assessment   Upper Extremity Assessment Upper Extremity Assessment: Defer to OT evaluation;Right hand dominant    Lower Extremity Assessment Lower Extremity Assessment: Generalized weakness       Communication   Communication Communication: No apparent difficulties    Cognition Arousal: Alert Behavior During Therapy: WFL for tasks assessed/performed   PT - Cognitive impairments: No family/caregiver present to determine baseline, Orientation, Memory, Problem solving, Safety/Judgement   Orientation impairments: Time, Situation                   PT - Cognition Comments: Oriented to year, self, location. Unsure of month or situation. Following commands: Intact       Cueing Cueing Techniques: Verbal cues     General Comments General comments (skin integrity, edema, etc.): Spoke with husband over the phone. States pt has RW at home and will use. Feels confident family can provide adequate support/supervision at home. All questions answered.    Exercises     Assessment/Plan    PT Assessment Patient needs continued PT services  PT Problem List Decreased strength;Decreased activity tolerance;Decreased balance;Decreased mobility;Decreased cognition;Decreased knowledge of use of DME;Decreased safety awareness;Decreased knowledge of precautions;Cardiopulmonary status limiting activity;Obesity       PT Treatment Interventions DME instruction;Gait training;Stair training;Functional mobility training;Therapeutic activities;Therapeutic exercise;Balance training;Neuromuscular re-education;Cognitive remediation;Patient/family education    PT Goals (Current goals can be found in the Care Plan section)  Acute Rehab PT Goals Patient Stated Goal: Go home - would like  an aide for her husband so she doesn't have to help him PT Goal Formulation: With patient/family Time For Goal Achievement: 05/26/23 Potential to Achieve Goals: Good    Frequency Min 1X/week     Co-evaluation               AM-PAC PT "6 Clicks" Mobility  Outcome Measure Help needed turning from your back to your side while in a flat bed without using bedrails?: None Help needed moving from lying on your back to sitting on the side of a flat bed without using bedrails?: None Help needed moving to and from a bed to a chair (including a wheelchair)?: A Little Help needed standing up from a chair using your arms (e.g., wheelchair or bedside chair)?: A Little Help needed to walk in hospital room?: A Little Help needed climbing 3-5 steps with a railing? : A Little 6 Click Score: 20    End of Session Equipment Utilized During Treatment: Gait belt Activity Tolerance: Patient tolerated treatment well Patient left: in chair;with call bell/phone within reach;with chair alarm set Nurse Communication: Mobility status PT Visit Diagnosis: Unsteadiness on feet (R26.81);Other abnormalities of gait and mobility (R26.89);History of falling (Z91.81);Muscle weakness (generalized) (M62.81);Other symptoms and signs involving the nervous system (R29.898)    Time: 1478-2956 PT Time Calculation (min) (ACUTE ONLY): 27 min   Charges:   PT Evaluation $PT Eval Low Complexity: 1 Low PT Treatments $Gait Training:  8-22 mins PT General Charges $$ ACUTE PT VISIT: 1 Visit         Kathlyn Sacramento, PT, DPT [facility]  Rehabilitation Services Physical Therapist Office: [phone] Website: [email or web address]   Berton Mount 05/12/2023, 11:51 AM

## 2023-05-12 NOTE — Plan of Care (Signed)

## 2023-05-14 ENCOUNTER — Telehealth: Payer: Self-pay | Admitting: *Deleted

## 2023-05-14 NOTE — Transitions of Care (Post Inpatient/ED Visit) (Signed)
 05/14/2023  Name: Pamela [name] MRN: [id number] DOB: 04-20-1955  Today's TOC FU Call Status: Today's TOC FU Call Status:: Successful TOC FU Call Completed TOC FU Call Complete Date: 05/14/23 Patient's Name and Date of Birth confirmed.  Transition Care Management Follow-up Telephone Call Date of Discharge: 05/12/23 Discharge Facility: [facility]) Type of Discharge: Inpatient Admission Primary Inpatient Discharge Diagnosis:: metabolic encepahlopathy/ SIRS/ pneumonia- pyelonephritis How have you been since you were released from the hospital?: Better ("I am doing fine; I am out driving and running errands; no questions or concerns around my medicine or anythng else.  I am independent.  Thanks for helping me get an appointment scheduled with Dr. Jonny Ruiz") Any questions or concerns?: No  Items Reviewed: Did you receive and understand the discharge instructions provided?: Yes Medications obtained,verified, and reconciled?: Partial Review Completed Reason for Partial Mediation Review: Patient declined: she is out driving and denies questions/ concerns around her medications: she confirms she obtained and is taking all newly prescribed medications post- hospital discharge on 05/12/23 Any new allergies since your discharge?: No Dietary orders reviewed?: Yes Type of Diet Ordered:: "Healthy as possible" Do you have support at home?: Yes People in Home: spouse Name of Support/Comfort Primary Source: Reports independent in self-care activities; supportive spouse assists as/ if needed/ indicated  Medications Reviewed Today: Medications Reviewed Today     Reviewed by Michaela Corner, RN (Registered Nurse) on 05/14/23 at 1228  Med List Status: <None>   Medication Order Taking? Sig Documenting Provider Last Dose Status Informant  adalimumab (HUMIRA, 2 PEN,) 40 MG/0.4ML pen [id number]  Inject 0.4 mLs (40 mg total) into the skin every 14 (fourteen) days. Pollyann Savoy, MD  Active   albuterol  (VENTOLIN HFA) 108 (90 Base) MCG/ACT inhaler [id number] Yes Inhale 2 puffs into the lungs every 6 (six) hours as needed for wheezing or shortness of breath. Leroy Sea, MD Taking Active   ALPRAZolam Prudy Feeler) 0.25 MG tablet [id number]  TAKE 1 TABLET BY MOUTH TWICE A DAY AS NEEDED Corwin Levins, MD  Active   atorvastatin (LIPITOR) 20 MG tablet [id number]  TAKE 1 TABLET BY MOUTH EVERY DAY Corwin Levins, MD  Active   cephALEXin (KEFLEX) 500 MG capsule [id number] Yes Take 1 capsule (500 mg total) by mouth 3 (three) times daily for 7 days. Leroy Sea, MD Taking Active   citalopram (CELEXA) 40 MG tablet [id number]  TAKE 1 TABLET BY MOUTH EVERY DAY Corwin Levins, MD  Active   cyclobenzaprine (FLEXERIL) 5 MG tablet [id number]  TAKE 1 TABLET BY MOUTH THREE TIMES A DAY AS NEEDED FOR MUSCLE SPASM  Patient taking differently: Take 5 mg by mouth 3 (three) times daily as needed for muscle spasms.   Corwin Levins, MD  Active   diclofenac Sodium (VOLTAREN ARTHRITIS PAIN) 1 % GEL [id number]  Apply topically daily. [provider]  Active   doxycycline (VIBRA-TABS) 100 MG tablet [id number] Yes Take 1 tablet (100 mg total) by mouth 2 (two) times daily. Leroy Sea, MD Taking Active   gabapentin (NEURONTIN) 600 MG tablet [id number]  Take 1 tablet (600 mg total) by mouth 3 (three) times daily. Corwin Levins, MD  Active   hydrochlorothiazide (MICROZIDE) 12.5 MG capsule [id number]  TAKE 1 CAPSULE(12.5 MG) BY MOUTH DAILY  Patient taking differently: Take 12.5 mg by mouth daily. TAKE 1 CAPSULE(12.5 MG) BY MOUTH DAILY   Corwin Levins, MD  Active   losartan (COZAAR) 100 MG tablet [id number]  TAKE 1 TABLET BY MOUTH EVERY DAY Corwin Levins, MD  Active   methotrexate (RHEUMATREX) 2.5 MG tablet [id number]  Take 4 tablets (10 mg total) by mouth once a week. Pollyann Savoy, MD  Active   metoCLOPramide (REGLAN) 10 MG tablet [id number]  TAKE 1 TABLET BY MOUTH TWICE A DAY IN THE MORNING AND IN THE EVENING  Patient  taking differently: Take 10 mg by mouth in the morning and at bedtime.   Unk Lightning, [place]  Active   Multiple Vitamin (MULTIVITAMIN WITH MINERALS) TABS tablet [id number]  Take 1 tablet by mouth daily. [provider]  Active Self  oxyCODONE-acetaminophen (PERCOCET) 5-325 MG tablet [id number]  Take 1 tablet by mouth every 6 (six) hours as needed for severe pain (pain score 7-10). Fanny Dance, MD  Active   pantoprazole (PROTONIX) 40 MG tablet [id number]  TAKE 1 TABLET BY MOUTH TWICE A DAY Unk Lightning, [place]  Active   traMADol (ULTRAM) 50 MG tablet [id number]  TAKE 1 TABLET BY MOUTH EVERY 6 HOURS AS NEEDED FOR PAIN Corwin Levins, MD  Active   traZODone (DESYREL) 50 MG tablet [id number]  TAKE 1/2 TO 1 TABLET BY MOUTH AT BEDTIME AS NEEDED FOR SLEEP  Patient taking differently: Take 50 mg by mouth at bedtime as needed for sleep.   Corwin Levins, MD  Active   zolpidem (AMBIEN) 10 MG tablet [id number]  TAKE 1 TABLET BY MOUTH EVERY DAY AT BEDTIME AS NEEDED  Patient taking differently: Take 10 mg by mouth at bedtime as needed for sleep. TAKE 1 TABLET BY MOUTH EVERY DAY AT BEDTIME AS NEEDED   Corwin Levins, MD  Active            Home Care and Equipment/Supplies: Were Home Health Services Ordered?: No Any new equipment or medical supplies ordered?: No  Functional Questionnaire: Do you need assistance with bathing/showering or dressing?: No Do you need assistance with meal preparation?: No Do you need assistance with eating?: No Do you have difficulty maintaining continence: No Do you need assistance with getting out of bed/getting out of a chair/moving?: No Do you have difficulty managing or taking your medications?: No  Follow up appointments reviewed: PCP Follow-up appointment confirmed?: Yes (care coordination outreach in real-time with scheduling care guide to successfully schedule hospital follow up PCP appointment 05/18/23) Date of PCP follow-up appointment?:  05/18/23 Follow-up Provider: PCP- Dr. Jonny Ruiz [facility] Follow-up appointment confirmed?: NA Do you need transportation to your follow-up appointment?: No Do you understand care options if your condition(s) worsen?: Yes-patient verbalized understanding  SDOH Interventions Today    Flowsheet Row Most Recent Value  SDOH Interventions   Food Insecurity Interventions Intervention Not Indicated  Housing Interventions Intervention Not Indicated  Transportation Interventions Intervention Not Indicated  [drives self]  Utilities Interventions Intervention Not Indicated      Interventions Today    Flowsheet Row Most Recent Value  Chronic Disease   Chronic disease during today's visit Other  General Interventions   General Interventions Discussed/Reviewed Durable Medical Equipment (DME)  Durable Medical Equipment (DME) Val Riles uses assistive devices on regular basis, at baseline -- walker prn, "I have a bad knee, have been using the walker on and off for quite awhile, but only when I need it when my knee is acting up"]  Education Interventions   Education Provided Provided Education  Provided Verbal Education On Medication  [Need to take newly prescribed antibiotics as prescribed,  need for full medication  review at time of hospital follow up visit on 05/18/23]  Nutrition Interventions   Nutrition Discussed/Reviewed Nutrition Discussed  Pharmacy Interventions   Pharmacy Dicussed/Reviewed Pharmacy Topics Discussed  [partial medication review completed, per patient request- declines full review today]  Safety Interventions   Safety Discussed/Reviewed Safety Discussed, Fall Risk  [provided education/ reinforcement around fall prevention]      TOC Interventions Today    Flowsheet Row Most Recent Value  TOC Interventions   TOC Interventions Discussed/Reviewed TOC Interventions Discussed, Arranged PCP follow up within 7 days/Care Guide scheduled  [Patient declines need for  ongoing/ further care management outreach,  declines enrollment in 30-day TOC program,  provided my direct contact information should questions/ concerns/ needs arise post-TOC call]      Total time spent from review to signing of note/ including any care coordination interventions:  39 minutes  Pls call/ message for questions,  Caryl Pina, RN, BSN, Media planner  Transitions of Care  [facility] [phone]: direct office

## 2023-05-15 LAB — CULTURE, BLOOD (ROUTINE X 2)
Culture: NO GROWTH
Culture: NO GROWTH
Special Requests: ADEQUATE

## 2023-05-18 ENCOUNTER — Inpatient Hospital Stay: Admitting: Internal Medicine

## 2023-05-18 NOTE — Progress Notes (Deleted)
 Office Visit Note  Patient: Pamela [name]             Date of Birth: [birth year]           MRN: [id number]             PCP: Corwin Levins, MD Referring: Corwin Levins, MD Visit Date: 05/31/2023 Occupation: @GUAROCC @  Subjective:  No chief complaint on file.   History of Present Illness: Pamela [name] is a 68 y.o. female ***     Activities of Daily Living:  Patient reports morning stiffness for *** {minute/hour:19697}.   Patient {ACTIONS;DENIES/REPORTS:21021675::"Denies"} nocturnal pain.  Difficulty dressing/grooming: {ACTIONS;DENIES/REPORTS:21021675::"Denies"} Difficulty climbing stairs: {ACTIONS;DENIES/REPORTS:21021675::"Denies"} Difficulty getting out of chair: {ACTIONS;DENIES/REPORTS:21021675::"Denies"} Difficulty using hands for taps, buttons, cutlery, and/or writing: {ACTIONS;DENIES/REPORTS:21021675::"Denies"}  No Rheumatology ROS completed.   PMFS History:  Patient Active Problem List   Diagnosis Date Noted   SIRS (systemic inflammatory response syndrome) (HCC) 05/11/2023   Acute encephalopathy 05/11/2023   Pyelonephritis 05/11/2023   Hypercalcemia 05/11/2023   Hypokalemia 05/11/2023   QT prolongation 05/11/2023   Metabolic acidosis 05/11/2023   Chronic kidney disease, stage 3a (HCC) 05/11/2023   Obesity (BMI 30-39.9) 05/11/2023   Anemia 01/03/2023   Left lumbar radiculopathy 06/18/2022   COVID-19 virus infection 03/31/2022   Chronic low back pain with left-sided sciatica 12/19/2021   Vitamin D deficiency 04/03/2021   Cough 03/29/2021   Acute upper respiratory infection 02/15/2021   Snoring 02/15/2021   Spinal stenosis of lumbar region 08/05/2019   Inability to walk 08/05/2019   Hives 07/08/2019   History of glaucoma 04/24/2019   Bursitis of left hip 12/25/2018   MGUS (monoclonal gammopathy of unknown significance) 05/11/2018   Rheumatoid arthritis involving multiple sites with positive rheumatoid factor (HCC) 05/03/2018   High risk medication use  05/03/2018   Status post total left knee replacement 05/03/2018   DDD (degenerative disc disease), lumbar 05/03/2018   Primary osteoarthritis of right knee 04/05/2018   Arthritis of left wrist 12/19/2017   Left rotator cuff tear 12/19/2017   Left wrist pain 12/12/2017   Left shoulder pain 08/22/2017   Acute pain of right knee 04/17/2017   Pain of left calf 04/13/2017   S/P left TK revision 10/09/2016   Hyperglycemia 09/23/2015   Anxiety state 09/23/2015   Abnormal urine odor 08/25/2015   GERD without esophagitis 08/25/2015   Upper abdominal pain 04/16/2015   Early satiety 04/16/2015   Dysuria 04/16/2015   Encounter for well adult exam with abnormal findings 06/18/2014   Greater trochanteric bursitis of right hip 01/06/2013   Left lumbar radiculitis 04/03/2012   Eczema 04/03/2012   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   BURSITIS, RIGHT HIP 08/25/2009   INSOMNIA-SLEEP DISORDER-UNSPEC 08/25/2009   FATIGUE 08/25/2009   Cervicalgia 08/14/2007   Depression 03/15/2007   HLD (hyperlipidemia) 12/18/2006   Essential hypertension 12/18/2006   Allergic rhinitis 12/18/2006   RENAL CALCULUS, HX OF 12/18/2006    Past Medical History:  Diagnosis Date   ALLERGIC RHINITIS 12/18/2006   Qualifier: Diagnosis of  By: Genelle Gather CMA, [place]     Anxiety    Arthritis    DEPRESSION, SITUATIONAL 03/15/2007   Qualifier: Diagnosis of  By: Nena Jordan    GERD (gastroesophageal reflux disease)    Headache    occasional   History of kidney stones    HYPERLIPIDEMIA 12/18/2006   Qualifier: History of  By: Genelle Gather CMA, [place]     Hypertension    HYPERTENSION 12/18/2006   Qualifier: Diagnosis  of  By: Genelle Gather CMA, [place]     INSOMNIA-SLEEP DISORDER-UNSPEC 08/25/2009   Qualifier: Diagnosis of  By: Jonny Ruiz MD, Len Blalock    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) 04/03/2012   Pneumonia    RENAL CALCULUS, HX OF 12/18/2006   Qualifier: Diagnosis of  By: Genelle Gather CMA, [place]      Family History  Problem  Relation Age of Onset   Stroke Mother    Heart disease Father    Lung cancer Sister    Lung cancer Brother    Healthy Daughter    Healthy Daughter    Healthy Daughter    Colon cancer Neg Hx    Esophageal cancer Neg Hx    Pancreatic cancer Neg Hx    Stomach cancer Neg Hx    Past Surgical History:  Procedure Laterality Date   CERVICAL DISC SURGERY     x 3   CONVERSION TO TOTAL KNEE Left 10/09/2016   Procedure: Conversion left uni compartment arthroplasty to total knee arthroplasty;  Surgeon: Durene Romans, MD;  Location: [facility];  Service: Orthopedics;  Laterality: Left;  90 mins   KNEE ARTHROSCOPY     partial   TOOTH EXTRACTION  2024   x2   total left knee arthoplasty     10/09/16 Dr. Charlann Boxer   Social History   Social History Narrative   Not on file   Immunization History  Administered Date(s) Administered   Fluad Quad(high Dose 65+) 04/16/2020, 12/24/2020, 12/19/2021   Fluad Trivalent(High Dose 65+) 12/25/2022   Influenza Whole 11/12/2012   Influenza,inj,Quad PF,6+ Mos 11/29/2016, 12/11/2017, 12/25/2018   Influenza-Unspecified 12/15/2013, 12/12/2015, 11/29/2016   PNEUMOCOCCAL CONJUGATE-20 06/15/2022   Td 11/03/2002   Tdap 09/23/2015   Zoster Recombinant(Shingrix) 03/22/2018, 08/20/2018   Zoster, Live 08/12/2012     Objective: Vital Signs: There were no vitals taken for this visit.   Physical Exam   Musculoskeletal Exam: ***  CDAI Exam: CDAI Score: -- Patient Global: --; Provider Global: -- Swollen: --; Tender: -- Joint Exam 05/31/2023   No joint exam has been documented for this visit   There is currently no information documented on the homunculus. Go to the Rheumatology activity and complete the homunculus joint exam.  Investigation: No additional findings.  Imaging: CT CHEST ABDOMEN PELVIS W CONTRAST Result Date: 05/11/2023 CLINICAL DATA:  Sepsis EXAM: CT CHEST, ABDOMEN, AND PELVIS WITH CONTRAST TECHNIQUE: Multidetector CT imaging of the chest, abdomen  and pelvis was performed following the standard protocol during bolus administration of intravenous contrast. RADIATION DOSE REDUCTION: This exam was performed according to the departmental dose-optimization program which includes automated exposure control, adjustment of the mA and/or kV according to patient size and/or use of iterative reconstruction technique. CONTRAST:  75mL OMNIPAQUE IOHEXOL 350 MG/ML SOLN COMPARISON:  03/09/2023 FINDINGS: CT CHEST FINDINGS Cardiovascular: Normal heart size. No pericardial effusion. Atheromatous calcification of the aorta and coronaries. Mediastinum/Nodes: No mass or adenopathy. 3.3 cm long enlarged lymph node at the left axilla, solitary. Lungs/Pleura: Bilateral lower lobe scarring along osteophytes and in the lateral right lower lobe, stable from most recent CT. On subtracted views of the lungs there are some pseudo nodules localizing to tortuous vessels at the lung bases and hila. There is no edema, consolidation, effusion, or pneumothorax. Musculoskeletal: Generalized thoracic spine degeneration with multilevel disc collapse and endplate spurring. No acute finding. CT ABDOMEN PELVIS FINDINGS Hepatobiliary: No focal liver abnormality.No evidence of biliary obstruction or stone. Pancreas: Unremarkable. Spleen: Unremarkable. Adrenals/Urinary Tract: Negative adrenals.  Some streak and motion artifact affects left renal cortical homogeneity, there is some question of hypoenhancing left renal cortex as seen with pyelonephritis but less convincing on the delayed phase. Advanced right renal atrophy. Small bilateral renal cystic densities which are non worrisome. Unremarkable bladder. Stomach/Bowel:  No obstruction. No visible bowel inflammation. Vascular/Lymphatic: No acute vascular abnormality. Scattered atheromatous calcification. No mass or adenopathy. Reproductive:No pathologic findings. Other: No ascites or pneumoperitoneum. Musculoskeletal: No acute abnormalities. Scoliosis  and advanced, generalized degeneration. IMPRESSION: 1. Equivocal findings of mild pyelonephritis in the left kidney. Advanced right renal atrophy. No hydronephrosis. 2. Focal enlarged left axillary lymph node at 3.3 cm in length. Recommend follow-up for consideration of biopsy. Electronically Signed   By: Tiburcio Pea M.D.   On: 05/11/2023 05:06   CT HEAD WO CONTRAST Result Date: 05/11/2023 CLINICAL DATA:  Altered mental status EXAM: CT HEAD WITHOUT CONTRAST TECHNIQUE: Contiguous axial images were obtained from the base of the skull through the vertex without intravenous contrast. RADIATION DOSE REDUCTION: This exam was performed according to the departmental dose-optimization program which includes automated exposure control, adjustment of the mA and/or kV according to patient size and/or use of iterative reconstruction technique. COMPARISON:  03/22/2023 FINDINGS: Brain: No evidence of acute infarction, hemorrhage, hydrocephalus, extra-axial collection or mass lesion/mass effect. Vascular: No hyperdense vessel or unexpected calcification. Skull: Normal. Negative for fracture or focal lesion. Sinuses/Orbits: No acute finding. Other: None. IMPRESSION: No acute intracranial abnormality noted. Electronically Signed   By: Alcide Clever M.D.   On: 05/11/2023 01:23   DG Chest Port 1 View Result Date: 05/10/2023 CLINICAL DATA:  Altered mental status EXAM: PORTABLE CHEST 1 VIEW COMPARISON:  03/29/2021 FINDINGS: Shallow inspiration. Heart size and pulmonary vascularity are normal for technique. Lungs are clear. No pleural effusion or pneumothorax. Mediastinal contours appear intact. IMPRESSION: No active disease. Electronically Signed   By: Burman Nieves M.D.   On: 05/10/2023 23:23    Recent Labs: Lab Results  Component Value Date   WBC 6.5 05/12/2023   HGB 11.4 (L) 05/12/2023   PLT 212 05/12/2023   NA 139 05/12/2023   K 3.3 (L) 05/12/2023   CL 106 05/12/2023   CO2 26 05/12/2023   GLUCOSE 102 (H)  05/12/2023   BUN 9 05/12/2023   CREATININE 0.74 05/12/2023   BILITOT 0.9 05/10/2023   ALKPHOS 51 05/10/2023   AST 26 05/10/2023   ALT 17 05/10/2023   PROT 7.7 05/10/2023   ALBUMIN 4.2 05/10/2023   CALCIUM 9.9 05/12/2023   GFRAA 74 05/14/2020   QFTBGOLDPLUS NEGATIVE 11/08/2022    Speciality Comments: PLQ Eye Exam: 03/26/2019 WNL @ [facility] PA FOllow up in 1 year  Procedures:  No procedures performed Allergies: Cymbalta [duloxetine hcl]   Assessment / Plan:     Visit Diagnoses: No diagnosis found.  Orders: No orders of the defined types were placed in this encounter.  No orders of the defined types were placed in this encounter.   Face-to-face time spent with patient was *** minutes. Greater than 50% of time was spent in counseling and coordination of care.  Follow-Up Instructions: No follow-ups on file.   Ellen Henri, CMA  Note - This record has been created using Animal nutritionist.  Chart creation errors have been sought, but may not always  have been located. Such creation errors do not reflect on  the standard of medical care.

## 2023-05-27 ENCOUNTER — Other Ambulatory Visit: Payer: Self-pay | Admitting: Rheumatology

## 2023-05-27 ENCOUNTER — Other Ambulatory Visit: Payer: Self-pay | Admitting: Physician Assistant

## 2023-05-27 ENCOUNTER — Other Ambulatory Visit: Payer: Self-pay | Admitting: Internal Medicine

## 2023-05-28 ENCOUNTER — Other Ambulatory Visit: Payer: Self-pay

## 2023-05-28 ENCOUNTER — Telehealth: Payer: Self-pay | Admitting: Physician Assistant

## 2023-05-28 MED ORDER — METOCLOPRAMIDE HCL 10 MG PO TABS: 10.0000 mg | ORAL_TABLET | Freq: Two times a day (BID) | ORAL | 0 refills | Status: DC

## 2023-05-28 NOTE — Telephone Encounter (Signed)
 Last Fill: 03/15/2023  Labs: 05/12/2023 Hemoglobin 11.4, HCT 33.2, Potassium 3.3, Glucose 102,   Next Visit: 05/31/2023  Last Visit: 12/28/2022  DX: Rheumatoid arthritis involving multiple sites with positive rheumatoid factor   Current Dose per office note 12/28/2022: Methotrexate 4 tablets by mouth once weekly,   Okay to refill Methotrexate?

## 2023-05-28 NOTE — Telephone Encounter (Signed)
 Sent script to pharmacy.

## 2023-05-28 NOTE — Telephone Encounter (Signed)
 Inbound call from patient requesting a refill for Reglan medication. Please advise, thank you.

## 2023-05-31 ENCOUNTER — Ambulatory Visit: Payer: Medicare HMO | Admitting: Rheumatology

## 2023-05-31 DIAGNOSIS — I10 Essential (primary) hypertension: Secondary | ICD-10-CM

## 2023-05-31 DIAGNOSIS — Z8639 Personal history of other endocrine, nutritional and metabolic disease: Secondary | ICD-10-CM

## 2023-05-31 DIAGNOSIS — M0579 Rheumatoid arthritis with rheumatoid factor of multiple sites without organ or systems involvement: Secondary | ICD-10-CM

## 2023-05-31 DIAGNOSIS — Z96652 Presence of left artificial knee joint: Secondary | ICD-10-CM

## 2023-05-31 DIAGNOSIS — Z8669 Personal history of other diseases of the nervous system and sense organs: Secondary | ICD-10-CM

## 2023-05-31 DIAGNOSIS — S46012D Strain of muscle(s) and tendon(s) of the rotator cuff of left shoulder, subsequent encounter: Secondary | ICD-10-CM

## 2023-05-31 DIAGNOSIS — M1711 Unilateral primary osteoarthritis, right knee: Secondary | ICD-10-CM

## 2023-05-31 DIAGNOSIS — R808 Other proteinuria: Secondary | ICD-10-CM

## 2023-05-31 DIAGNOSIS — E559 Vitamin D deficiency, unspecified: Secondary | ICD-10-CM

## 2023-05-31 DIAGNOSIS — M7061 Trochanteric bursitis, right hip: Secondary | ICD-10-CM

## 2023-05-31 DIAGNOSIS — Z79899 Other long term (current) drug therapy: Secondary | ICD-10-CM

## 2023-05-31 DIAGNOSIS — Z8719 Personal history of other diseases of the digestive system: Secondary | ICD-10-CM

## 2023-05-31 DIAGNOSIS — M47816 Spondylosis without myelopathy or radiculopathy, lumbar region: Secondary | ICD-10-CM

## 2023-05-31 DIAGNOSIS — F32A Depression, unspecified: Secondary | ICD-10-CM

## 2023-06-04 NOTE — Progress Notes (Deleted)
 Office Visit Note  Patient: Pamela [name]             Date of Birth: [birth year]           MRN: [id number]             PCP: Corwin Levins, MD Referring: Corwin Levins, MD Visit Date: 06/05/2023 Occupation: @GUAROCC @  Subjective:  No chief complaint on file.   History of Present Illness: Pamela [name] is a 68 y.o. female ***     Activities of Daily Living:  Patient reports morning stiffness for *** {minute/hour:19697}.   Patient {ACTIONS;DENIES/REPORTS:21021675::"Denies"} nocturnal pain.  Difficulty dressing/grooming: {ACTIONS;DENIES/REPORTS:21021675::"Denies"} Difficulty climbing stairs: {ACTIONS;DENIES/REPORTS:21021675::"Denies"} Difficulty getting out of chair: {ACTIONS;DENIES/REPORTS:21021675::"Denies"} Difficulty using hands for taps, buttons, cutlery, and/or writing: {ACTIONS;DENIES/REPORTS:21021675::"Denies"}  No Rheumatology ROS completed.   PMFS History:  Patient Active Problem List   Diagnosis Date Noted   SIRS (systemic inflammatory response syndrome) (HCC) 05/11/2023   Acute encephalopathy 05/11/2023   Pyelonephritis 05/11/2023   Hypercalcemia 05/11/2023   Hypokalemia 05/11/2023   QT prolongation 05/11/2023   Metabolic acidosis 05/11/2023   Chronic kidney disease, stage 3a (HCC) 05/11/2023   Obesity (BMI 30-39.9) 05/11/2023   Anemia 01/03/2023   Left lumbar radiculopathy 06/18/2022   COVID-19 virus infection 03/31/2022   Chronic low back pain with left-sided sciatica 12/19/2021   Vitamin D deficiency 04/03/2021   Cough 03/29/2021   Acute upper respiratory infection 02/15/2021   Snoring 02/15/2021   Spinal stenosis of lumbar region 08/05/2019   Inability to walk 08/05/2019   Hives 07/08/2019   History of glaucoma 04/24/2019   Bursitis of left hip 12/25/2018   MGUS (monoclonal gammopathy of unknown significance) 05/11/2018   Rheumatoid arthritis involving multiple sites with positive rheumatoid factor (HCC) 05/03/2018   High risk medication use  05/03/2018   Status post total left knee replacement 05/03/2018   DDD (degenerative disc disease), lumbar 05/03/2018   Primary osteoarthritis of right knee 04/05/2018   Arthritis of left wrist 12/19/2017   Left rotator cuff tear 12/19/2017   Left wrist pain 12/12/2017   Left shoulder pain 08/22/2017   Acute pain of right knee 04/17/2017   Pain of left calf 04/13/2017   S/P left TK revision 10/09/2016   Hyperglycemia 09/23/2015   Anxiety state 09/23/2015   Abnormal urine odor 08/25/2015   GERD without esophagitis 08/25/2015   Upper abdominal pain 04/16/2015   Early satiety 04/16/2015   Dysuria 04/16/2015   Encounter for well adult exam with abnormal findings 06/18/2014   Greater trochanteric bursitis of right hip 01/06/2013   Left lumbar radiculitis 04/03/2012   Eczema 04/03/2012   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   BURSITIS, RIGHT HIP 08/25/2009   INSOMNIA-SLEEP DISORDER-UNSPEC 08/25/2009   FATIGUE 08/25/2009   Cervicalgia 08/14/2007   Depression 03/15/2007   HLD (hyperlipidemia) 12/18/2006   Essential hypertension 12/18/2006   Allergic rhinitis 12/18/2006   RENAL CALCULUS, HX OF 12/18/2006    Past Medical History:  Diagnosis Date   ALLERGIC RHINITIS 12/18/2006   Qualifier: Diagnosis of  By: Genelle Gather CMA, [place]     Anxiety    Arthritis    DEPRESSION, SITUATIONAL 03/15/2007   Qualifier: Diagnosis of  By: Nena Jordan    GERD (gastroesophageal reflux disease)    Headache    occasional   History of kidney stones    HYPERLIPIDEMIA 12/18/2006   Qualifier: History of  By: Genelle Gather CMA, [place]     Hypertension    HYPERTENSION 12/18/2006   Qualifier: Diagnosis  of  By: Genelle Gather CMA, [place]     INSOMNIA-SLEEP DISORDER-UNSPEC 08/25/2009   Qualifier: Diagnosis of  By: Jonny Ruiz MD, Len Blalock    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) 04/03/2012   Pneumonia    RENAL CALCULUS, HX OF 12/18/2006   Qualifier: Diagnosis of  By: Genelle Gather CMA, [place]      Family History  Problem  Relation Age of Onset   Stroke Mother    Heart disease Father    Lung cancer Sister    Lung cancer Brother    Healthy Daughter    Healthy Daughter    Healthy Daughter    Colon cancer Neg Hx    Esophageal cancer Neg Hx    Pancreatic cancer Neg Hx    Stomach cancer Neg Hx    Past Surgical History:  Procedure Laterality Date   CERVICAL DISC SURGERY     x 3   CONVERSION TO TOTAL KNEE Left 10/09/2016   Procedure: Conversion left uni compartment arthroplasty to total knee arthroplasty;  Surgeon: Durene Romans, MD;  Location: [facility];  Service: Orthopedics;  Laterality: Left;  90 mins   KNEE ARTHROSCOPY     partial   TOOTH EXTRACTION  2024   x2   total left knee arthoplasty     10/09/16 Dr. Charlann Boxer   Social History   Social History Narrative   Not on file   Immunization History  Administered Date(s) Administered   Fluad Quad(high Dose 65+) 04/16/2020, 12/24/2020, 12/19/2021   Fluad Trivalent(High Dose 65+) 12/25/2022   Influenza Whole 11/12/2012   Influenza,inj,Quad PF,6+ Mos 11/29/2016, 12/11/2017, 12/25/2018   Influenza-Unspecified 12/15/2013, 12/12/2015, 11/29/2016   PNEUMOCOCCAL CONJUGATE-20 06/15/2022   Td 11/03/2002   Tdap 09/23/2015   Zoster Recombinant(Shingrix) 03/22/2018, 08/20/2018   Zoster, Live 08/12/2012     Objective: Vital Signs: There were no vitals taken for this visit.   Physical Exam   Musculoskeletal Exam: ***  CDAI Exam: CDAI Score: -- Patient Global: --; Provider Global: -- Swollen: --; Tender: -- Joint Exam 06/05/2023   No joint exam has been documented for this visit   There is currently no information documented on the homunculus. Go to the Rheumatology activity and complete the homunculus joint exam.  Investigation: No additional findings.  Imaging: CT CHEST ABDOMEN PELVIS W CONTRAST Result Date: 05/11/2023 CLINICAL DATA:  Sepsis EXAM: CT CHEST, ABDOMEN, AND PELVIS WITH CONTRAST TECHNIQUE: Multidetector CT imaging of the chest, abdomen  and pelvis was performed following the standard protocol during bolus administration of intravenous contrast. RADIATION DOSE REDUCTION: This exam was performed according to the departmental dose-optimization program which includes automated exposure control, adjustment of the mA and/or kV according to patient size and/or use of iterative reconstruction technique. CONTRAST:  75mL OMNIPAQUE IOHEXOL 350 MG/ML SOLN COMPARISON:  03/09/2023 FINDINGS: CT CHEST FINDINGS Cardiovascular: Normal heart size. No pericardial effusion. Atheromatous calcification of the aorta and coronaries. Mediastinum/Nodes: No mass or adenopathy. 3.3 cm long enlarged lymph node at the left axilla, solitary. Lungs/Pleura: Bilateral lower lobe scarring along osteophytes and in the lateral right lower lobe, stable from most recent CT. On subtracted views of the lungs there are some pseudo nodules localizing to tortuous vessels at the lung bases and hila. There is no edema, consolidation, effusion, or pneumothorax. Musculoskeletal: Generalized thoracic spine degeneration with multilevel disc collapse and endplate spurring. No acute finding. CT ABDOMEN PELVIS FINDINGS Hepatobiliary: No focal liver abnormality.No evidence of biliary obstruction or stone. Pancreas: Unremarkable. Spleen: Unremarkable. Adrenals/Urinary Tract: Negative adrenals.  Some streak and motion artifact affects left renal cortical homogeneity, there is some question of hypoenhancing left renal cortex as seen with pyelonephritis but less convincing on the delayed phase. Advanced right renal atrophy. Small bilateral renal cystic densities which are non worrisome. Unremarkable bladder. Stomach/Bowel:  No obstruction. No visible bowel inflammation. Vascular/Lymphatic: No acute vascular abnormality. Scattered atheromatous calcification. No mass or adenopathy. Reproductive:No pathologic findings. Other: No ascites or pneumoperitoneum. Musculoskeletal: No acute abnormalities. Scoliosis  and advanced, generalized degeneration. IMPRESSION: 1. Equivocal findings of mild pyelonephritis in the left kidney. Advanced right renal atrophy. No hydronephrosis. 2. Focal enlarged left axillary lymph node at 3.3 cm in length. Recommend follow-up for consideration of biopsy. Electronically Signed   By: Tiburcio Pea M.D.   On: 05/11/2023 05:06   CT HEAD WO CONTRAST Result Date: 05/11/2023 CLINICAL DATA:  Altered mental status EXAM: CT HEAD WITHOUT CONTRAST TECHNIQUE: Contiguous axial images were obtained from the base of the skull through the vertex without intravenous contrast. RADIATION DOSE REDUCTION: This exam was performed according to the departmental dose-optimization program which includes automated exposure control, adjustment of the mA and/or kV according to patient size and/or use of iterative reconstruction technique. COMPARISON:  03/22/2023 FINDINGS: Brain: No evidence of acute infarction, hemorrhage, hydrocephalus, extra-axial collection or mass lesion/mass effect. Vascular: No hyperdense vessel or unexpected calcification. Skull: Normal. Negative for fracture or focal lesion. Sinuses/Orbits: No acute finding. Other: None. IMPRESSION: No acute intracranial abnormality noted. Electronically Signed   By: Alcide Clever M.D.   On: 05/11/2023 01:23   DG Chest Port 1 View Result Date: 05/10/2023 CLINICAL DATA:  Altered mental status EXAM: PORTABLE CHEST 1 VIEW COMPARISON:  03/29/2021 FINDINGS: Shallow inspiration. Heart size and pulmonary vascularity are normal for technique. Lungs are clear. No pleural effusion or pneumothorax. Mediastinal contours appear intact. IMPRESSION: No active disease. Electronically Signed   By: Burman Nieves M.D.   On: 05/10/2023 23:23    Recent Labs: Lab Results  Component Value Date   WBC 6.5 05/12/2023   HGB 11.4 (L) 05/12/2023   PLT 212 05/12/2023   NA 139 05/12/2023   K 3.3 (L) 05/12/2023   CL 106 05/12/2023   CO2 26 05/12/2023   GLUCOSE 102 (H)  05/12/2023   BUN 9 05/12/2023   CREATININE 0.74 05/12/2023   BILITOT 0.9 05/10/2023   ALKPHOS 51 05/10/2023   AST 26 05/10/2023   ALT 17 05/10/2023   PROT 7.7 05/10/2023   ALBUMIN 4.2 05/10/2023   CALCIUM 9.9 05/12/2023   GFRAA 74 05/14/2020   QFTBGOLDPLUS NEGATIVE 11/08/2022    Speciality Comments: PLQ Eye Exam: 03/26/2019 WNL @ [facility] PA FOllow up in 1 year  Procedures:  No procedures performed Allergies: Cymbalta [duloxetine hcl]   Assessment / Plan:     Visit Diagnoses: No diagnosis found.  Orders: No orders of the defined types were placed in this encounter.  No orders of the defined types were placed in this encounter.   Face-to-face time spent with patient was *** minutes. Greater than 50% of time was spent in counseling and coordination of care.  Follow-Up Instructions: No follow-ups on file.   Ellen Henri, CMA  Note - This record has been created using Animal nutritionist.  Chart creation errors have been sought, but may not always  have been located. Such creation errors do not reflect on  the standard of medical care.

## 2023-06-05 ENCOUNTER — Ambulatory Visit: Admitting: Rheumatology

## 2023-06-05 DIAGNOSIS — M0579 Rheumatoid arthritis with rheumatoid factor of multiple sites without organ or systems involvement: Secondary | ICD-10-CM

## 2023-06-05 DIAGNOSIS — Z96652 Presence of left artificial knee joint: Secondary | ICD-10-CM

## 2023-06-05 DIAGNOSIS — M7061 Trochanteric bursitis, right hip: Secondary | ICD-10-CM

## 2023-06-05 DIAGNOSIS — R808 Other proteinuria: Secondary | ICD-10-CM

## 2023-06-05 DIAGNOSIS — E559 Vitamin D deficiency, unspecified: Secondary | ICD-10-CM

## 2023-06-05 DIAGNOSIS — Z79899 Other long term (current) drug therapy: Secondary | ICD-10-CM

## 2023-06-05 DIAGNOSIS — Z8639 Personal history of other endocrine, nutritional and metabolic disease: Secondary | ICD-10-CM

## 2023-06-05 DIAGNOSIS — S46012D Strain of muscle(s) and tendon(s) of the rotator cuff of left shoulder, subsequent encounter: Secondary | ICD-10-CM

## 2023-06-05 DIAGNOSIS — M47816 Spondylosis without myelopathy or radiculopathy, lumbar region: Secondary | ICD-10-CM

## 2023-06-05 DIAGNOSIS — Z8669 Personal history of other diseases of the nervous system and sense organs: Secondary | ICD-10-CM

## 2023-06-05 DIAGNOSIS — M1711 Unilateral primary osteoarthritis, right knee: Secondary | ICD-10-CM

## 2023-06-05 DIAGNOSIS — F32A Depression, unspecified: Secondary | ICD-10-CM

## 2023-06-05 DIAGNOSIS — Z8719 Personal history of other diseases of the digestive system: Secondary | ICD-10-CM

## 2023-06-05 DIAGNOSIS — I10 Essential (primary) hypertension: Secondary | ICD-10-CM

## 2023-06-07 ENCOUNTER — Encounter: Payer: Medicare HMO | Admitting: Physical Medicine & Rehabilitation

## 2023-06-11 ENCOUNTER — Other Ambulatory Visit: Payer: Self-pay

## 2023-06-11 ENCOUNTER — Encounter: Attending: Physical Medicine & Rehabilitation | Admitting: Physical Medicine & Rehabilitation

## 2023-06-11 DIAGNOSIS — G894 Chronic pain syndrome: Secondary | ICD-10-CM | POA: Insufficient documentation

## 2023-06-11 DIAGNOSIS — Z79891 Long term (current) use of opiate analgesic: Secondary | ICD-10-CM | POA: Insufficient documentation

## 2023-06-11 DIAGNOSIS — Z5181 Encounter for therapeutic drug level monitoring: Secondary | ICD-10-CM | POA: Insufficient documentation

## 2023-06-11 MED ORDER — OXYCODONE-ACETAMINOPHEN 5-325 MG PO TABS: 1.0000 | ORAL_TABLET | Freq: Four times a day (QID) | ORAL | 0 refills | Status: DC | PRN

## 2023-06-11 NOTE — Telephone Encounter (Signed)
 Patient requesting refill on Percocet. Appt April 21st

## 2023-06-14 ENCOUNTER — Ambulatory Visit: Admitting: Internal Medicine

## 2023-06-14 ENCOUNTER — Telehealth: Payer: Self-pay | Admitting: Orthopedic Surgery

## 2023-06-15 ENCOUNTER — Ambulatory Visit: Admitting: Nurse Practitioner

## 2023-06-15 ENCOUNTER — Ambulatory Visit: Payer: Self-pay

## 2023-06-15 VITALS — BP 148/88 | HR 102 | Temp 98.2°F | Ht 68.0 in | Wt 222.1 lb

## 2023-06-15 DIAGNOSIS — B370 Candidal stomatitis: Secondary | ICD-10-CM | POA: Diagnosis not present

## 2023-06-15 DIAGNOSIS — R591 Generalized enlarged lymph nodes: Secondary | ICD-10-CM | POA: Diagnosis not present

## 2023-06-15 DIAGNOSIS — J020 Streptococcal pharyngitis: Secondary | ICD-10-CM | POA: Diagnosis not present

## 2023-06-15 LAB — POC COVID19 BINAXNOW: SARS Coronavirus 2 Ag: NEGATIVE

## 2023-06-15 LAB — POCT RAPID STREP A (OFFICE): Rapid Strep A Screen: NEGATIVE

## 2023-06-15 LAB — POCT RESPIRATORY SYNCYTIAL VIRUS: RSV Rapid Ag: NEGATIVE

## 2023-06-15 LAB — POCT INFLUENZA A/B
Influenza A, POC: NEGATIVE
Influenza B, POC: NEGATIVE

## 2023-06-15 MED ORDER — NYSTATIN 100000 UNIT/ML MT SUSP: 5.0000 mL | Freq: Four times a day (QID) | OROMUCOSAL | 0 refills | Status: AC

## 2023-06-15 MED ORDER — DOXYCYCLINE HYCLATE 100 MG PO TABS
100.0000 mg | ORAL_TABLET | Freq: Two times a day (BID) | ORAL | 0 refills | Status: DC
Start: 2023-06-15 — End: 2023-06-28

## 2023-06-15 NOTE — Assessment & Plan Note (Signed)
 Acute Point-of-care strep test negative, however patient had chills and she has very beefy red pharynx. Will treat her with course of doxycycline 100 mg twice daily x 10 days.

## 2023-06-15 NOTE — Progress Notes (Signed)
 Established Patient Office Visit  Subjective   Patient ID: Pamela [name], female    DOB: 11/20/1955  Age: 68 y.o. MRN: [id number]  Chief Complaint  Patient presents with   Sore Throat   Patient has for acute visit for the above. Past medical history significant for anxiety, arthritis, GERD, hyperlipidemia, hypertension, depression, pneumonia, kidney stones. She was hospitalized 05/10/2023 - 05/12/2023 due to sepsis related to pyelonephritis and community-acquired pneumonia.  She was treated with antibiotics.  She did experience acute encephalopathy and headache.  CT scan of the head showed no focal deficits.  She was also noted to have prolonged QT interval during hospitalization but this seemed to have improved after being treated with QTc initially being 579 but returning to 430 on telemetry by time of discharge.  CT imaging of the chest during hospitalization identified left axillary lymph node measuring 3.3 cm with unclear etiology.  It was recommended that PCP follow-up.     Review of Systems  Constitutional:  Positive for chills. Negative for fever.  HENT:  Positive for sore throat.   Respiratory:  Negative for cough, shortness of breath and wheezing.   Cardiovascular:  Negative for chest pain and palpitations.  Musculoskeletal:  Positive for myalgias.  Neurological:  Positive for headaches.      Objective:     BP (!) 148/88   Pulse (!) 102   Temp 98.2 F (36.8 C) (Temporal)   Ht 5\' 8"  (1.727 m)   Wt 222 lb 2 oz (100.8 kg)   SpO2 94%   BMI 33.77 kg/m    Physical Exam Vitals reviewed.  Constitutional:      General: She is not in acute distress.    Appearance: Normal appearance.  HENT:     Head: Normocephalic and atraumatic.     Mouth/Throat:     Mouth: Oral lesions present.     Pharynx: Uvula midline. Pharyngeal swelling, oropharyngeal exudate and posterior oropharyngeal erythema present.  Cardiovascular:     Rate and Rhythm: Normal rate and regular rhythm.      Pulses: Normal pulses.     Heart sounds: Normal heart sounds.  Pulmonary:     Effort: Pulmonary effort is normal.     Breath sounds: Normal breath sounds.  Skin:    General: Skin is warm and dry.  Neurological:     General: No focal deficit present.     Mental Status: She is alert and oriented to person, place, and time.  Psychiatric:        Mood and Affect: Mood normal.        Behavior: Behavior normal.        Judgment: Judgment normal.      Results for orders placed or performed in visit on 06/15/23  POC COVID-19 BinaxNow  Result Value Ref Range   SARS Coronavirus 2 Ag Negative Negative  POCT rapid strep A  Result Value Ref Range   Rapid Strep A Screen Negative Negative  POCT Influenza A/B  Result Value Ref Range   Influenza A, POC Negative Negative   Influenza B, POC Negative Negative  POCT respiratory syncytial virus  Result Value Ref Range   RSV Rapid Ag negative       The 10-year ASCVD risk score (Arnett DK, et al., 2019) is: 12.3%    Assessment & Plan:   Problem List Items Addressed This Visit       Respiratory   Strep pharyngitis - Primary   Acute Point-of-care strep test negative,  however patient had chills and she has very beefy red pharynx. Will treat her with course of doxycycline 100 mg twice daily x 10 days.      Relevant Medications   doxycycline (VIBRA-TABS) 100 MG tablet   nystatin (MYCOSTATIN) 100000 UNIT/ML suspension   Other Relevant Orders   POC COVID-19 BinaxNow (Completed)   POCT rapid strep A (Completed)   POCT Influenza A/B (Completed)   POCT respiratory syncytial virus (Completed)     Digestive   Thrush   Patient does have a little bit of a white film on her tongue questionable thrush.  Will treat with nystatin swish and spit 4 times a day.      Relevant Medications   nystatin (MYCOSTATIN) 100000 UNIT/ML suspension     Immune and Lymphatic   Lymphadenopathy   Identified on imaging during hospitalization.  Will  forward this note to PCP to remind him to discuss at posthospital follow-up next week.       Return for As scheduled with PCP .    Elenore Paddy, NP

## 2023-06-15 NOTE — Patient Instructions (Addendum)
 Tylenol for pain

## 2023-06-15 NOTE — Assessment & Plan Note (Signed)
 Identified on imaging during hospitalization.  Will forward this note to PCP to remind him to discuss at posthospital follow-up next week.

## 2023-06-15 NOTE — Assessment & Plan Note (Signed)
 Patient does have a little bit of a white film on her tongue questionable thrush.  Will treat with nystatin swish and spit 4 times a day.

## 2023-06-15 NOTE — Telephone Encounter (Signed)
 Copied from CRM [id number]. Topic: Clinical - Red Word Triage >> Jun 15, 2023 11:36 AM Drema Balzarine wrote: Red Word that prompted transfer to Nurse Triage: Patient has sore throat, tongue is red, and she can't eat - she says her throat and mouth is very painful for the past 3 days  Chief Complaint: sore throat and mouth with sores Symptoms: pain, difficulty eating Frequency: started two days ago Pertinent Negatives: Patient denies sob, fever Disposition: [] ED /[] Urgent Care (no appt availability in office) / [x] Appointment(In office/virtual)/ []  [place] [facility]/ [] Home Care/ [] Refused Recommended Disposition /[] [facility]/ []  Follow-up with PCP Additional Notes: per protocol apt made for today; care advice given, denies questions; instructed to go to ER if becomes worse.   Reason for Disposition  SEVERE (e.g., excruciating) throat pain  Answer Assessment - Initial Assessment Questions 1. ONSET: "When did the throat start hurting?" (Hours or days ago)      Mouth and throat pain started 2 days ago 2. SEVERITY: "How bad is the sore throat?" (Scale 1-10; mild, moderate or severe)   - MILD (1-3):  Doesn't interfere with eating or normal activities.   - MODERATE (4-7): Interferes with eating some solids and normal activities.   - SEVERE (8-10):  Excruciating pain, interferes with most normal activities.   - SEVERE WITH DYSPHAGIA (10): Can't swallow liquids, drooling.     severe 3. STREP EXPOSURE: "Has there been any exposure to strep within the past week?" If Yes, ask: "What type of contact occurred?"      States sores on mouth and inside 4.  VIRAL SYMPTOMS: "Are there any symptoms of a cold, such as a runny nose, cough, hoarse voice or red eyes?"      Hoarse voice, runny nose 5. FEVER: "Do you have a fever?" If Yes, ask: "What is your temperature, how was it measured, and when did it start?"     chills 6. PUS ON THE TONSILS: "Is there pus on the tonsils in the back of your  throat?"     Can't see them 7. OTHER SYMPTOMS: "Do you have any other symptoms?" (e.g., difficulty breathing, headache, rash)     denies 8. PREGNANCY: "Is there any chance you are pregnant?" "When was your last menstrual period?"     na  Protocols used: Sore Throat-A-AH

## 2023-06-24 ENCOUNTER — Other Ambulatory Visit: Payer: Self-pay | Admitting: Internal Medicine

## 2023-06-25 ENCOUNTER — Ambulatory Visit: Payer: Medicare HMO | Admitting: Internal Medicine

## 2023-06-25 ENCOUNTER — Ambulatory Visit: Payer: Self-pay

## 2023-06-25 ENCOUNTER — Other Ambulatory Visit: Payer: Self-pay

## 2023-06-25 NOTE — Telephone Encounter (Signed)
 Chief Complaint: back pain Symptoms: 9/10 upper back pain Frequency: "been there a long time" Pertinent Negatives: Patient denies fever, urinary symptoms, numbness, pain that radiates  Disposition: [] ED /[] Urgent Care (no appt availability in office) / [x] Appointment(In office/virtual)/ []  [facility] [facility]/ [] Home Care/ [x] Refused Recommended Disposition /[]  Mobile Bus/ []  Follow-up with PCP Additional Notes: Pt reports 9/20 upper back pain. Pt states she's had this pain for a while. Pt denies fever, CP, SOB, urinary symptoms, numbness, tingling, loss of bowel/bladder control. Pt had an appt scheduled for today that she cancelled due to pain. Pt is taking ibuprofen with minimal relief. RN advised pt that she should be seen within 4 hrs for her pain but pt states she would not be able to come in today because she is in too much pain. RN rescheduled pt's follow-up office visit for Thursday, pt agreeable to that plan. Pt asked RN if pain medication could be called into the pharmacy for her. RN advised pt RN would relay symptoms to the appropriate people for follow-up. Pt verbalized understanding. RN advised pt if she gets worse, if she becomes unable to walk, develops numbness, or loss of bowel/bladder control, pt needs to call 911. Pt verbalized understanding.     Copied from CRM [id number]. Topic: Appointments - Appointment Cancel/Reschedule >> Jun 25, 2023  9:47 AM Beacher May wrote: Patient/patient representative is calling to cancel or reschedule an appointment. Refer to attachments for appointment information.Pt cancelled today's appointment due to back pain, and is looking for medical advice on any back pain prescription. Pain is at a 5 out of 5. Warm transfer to nurse. Reason for Disposition  [1] SEVERE back pain (e.g., excruciating, unable to do any normal activities) AND [2] not improved 2 hours after pain medicine  Answer Assessment - Initial Assessment Questions 1. ONSET:  "When did the pain begin?"      "Been there a long time" 2. LOCATION: "Where does it hurt?" (upper, mid or lower back)     Upper 3. SEVERITY: "How bad is the pain?"  (e.g., Scale 1-10; mild, moderate, or severe)   - MILD (1-3): Doesn't interfere with normal activities.    - MODERATE (4-7): Interferes with normal activities or awakens from sleep.    - SEVERE (8-10): Excruciating pain, unable to do any normal activities.      8-9/10 4. PATTERN: "Is the pain constant?" (e.g., yes, no; constant, intermittent)      Constant  5. RADIATION: "Does the pain shoot into your legs or somewhere else?"     No  6. CAUSE:  "What do you think is causing the back pain?"      Hx of chronic back pain 7. BACK OVERUSE:  "Any recent lifting of heavy objects, strenuous work or exercise?"     No 8. MEDICINES: "What have you taken so far for the pain?" (e.g., nothing, acetaminophen, NSAIDS)     Ibuprofen - does not work "too good" 9. NEUROLOGIC SYMPTOMS: "Do you have any weakness, numbness, or problems with bowel/bladder control?"     No 10. OTHER SYMPTOMS: "Do you have any other symptoms?" (e.g., fever, abdomen pain, burning with urination, blood in urine)       Pt states she can walk "but not too good", denies urinary symptoms, denies fever. Denies abd pain. No CP or SOB  Protocols used: Back Pain-A-AH

## 2023-06-25 NOTE — Telephone Encounter (Unsigned)
 Copied from CRM [id number]. Topic: Appointments - Appointment Cancel/Reschedule >> Jun 25, 2023  9:47 AM Rosaria Common wrote: Patient/patient representative is calling to cancel or reschedule an appointment. Refer to attachments for appointment information.Pt cancelled today's appointment due to back pain, and is looking for medical advice on any back pain prescription. Pain is at a 5 out of 5. Warm transfer to nurse.

## 2023-06-26 ENCOUNTER — Ambulatory Visit: Admitting: Gastroenterology

## 2023-06-26 NOTE — Progress Notes (Deleted)
 Chief Complaint: Primary GI MD: Dr. Christella Hartigan  HPI: 68 year old female with past medical history as listed below presents for evaluation of  01/2020: Patient seen for follow-up of gastroparesis with complaints of coughing/gagging on mucus.  Reglan 10 Mg twice daily helped as well as pantoprazole 40 Mg twice daily  06/2021: Reported occasional flare of "stomach symptoms" with epigastric pain and some indigestion but overall doing well for 80% of the time.  Reglan 10 Mg twice daily and pantoprazole 40 Mg twice daily was refilled  CTAP with contrast 02/2026 for abdominal pain and nausea showed changes consistent with diffuse gastritis.  Mild perigastric inflammatory changes.  Hospitalization February 2025 for sepsis thought to be secondary to pyelonephritis with community-acquired pneumonia.  CT chest abdomen pelvis with contrast at this time showed no bowel or stomach inflammation.  Showed focal enlarged left axillary lymph node at 3.3 cm with recommendation for biopsy.     Discussed the use of AI scribe software for clinical note transcription with the patient, who gave verbal consent to proceed.  History of Present Illness      PREVIOUS GI WORKUP   Review of pertinent gastrointestinal problems: 1. Routine risk for colon cancer:  Routine screening colonoscopy 02/2007 Dr. Christella Hartigan found single small HP polyp, was recommended to have repeat screening in 10 years. Colonoscopy 10/2015 was normal.  (Due for repeat 2027) 2. Dysphagia; EGD 04/2015 Dr. Christella Hartigan found thin Schatzki's ring (dilated to 20mm), also retained food in stomach, gastritis (H. Pylori neg by biopsy).   3. 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 4. Early satiety, weight loss: 2017 workup; CT scan abd/pelv 09/2015 essentially normal.   Past Medical History:  Diagnosis Date   ALLERGIC  RHINITIS 12/18/2006   Qualifier: Diagnosis of  By: Genelle Gather CMA, [place]     Anxiety    Arthritis    DEPRESSION, SITUATIONAL 03/15/2007   Qualifier: Diagnosis of  By: Nena Jordan    GERD (gastroesophageal reflux disease)    Headache    occasional   History of kidney stones    HYPERLIPIDEMIA 12/18/2006   Qualifier: History of  By: Genelle Gather CMA, [place]     Hypertension    HYPERTENSION 12/18/2006   Qualifier: Diagnosis of  By: Genelle Gather CMA, [place]     INSOMNIA-SLEEP DISORDER-UNSPEC 08/25/2009   Qualifier: Diagnosis of  By: Jonny Ruiz MD, Len Blalock    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 Charlann Boxer    Current Outpatient Medications  Medication Sig Dispense Refill   adalimumab (HUMIRA, 2 PEN,) 40 MG/0.4ML pen Inject 0.4 mLs (40 mg total) into the skin every 14 (fourteen) days. 6 each 0   albuterol (VENTOLIN HFA) 108 (90 Base) MCG/ACT inhaler Inhale 2 puffs  into the lungs every 6 (six) hours as needed for wheezing or shortness of breath. 6.7 g 2   ALPRAZolam (XANAX) 0.25 MG tablet TAKE 1 TABLET BY MOUTH TWICE A DAY AS NEEDED 60 tablet 2   atorvastatin (LIPITOR) 20 MG tablet TAKE 1 TABLET BY MOUTH EVERY DAY 90 tablet 3   citalopram (CELEXA) 40 MG tablet TAKE 1 TABLET BY MOUTH EVERY DAY 90 tablet 2   cyclobenzaprine (FLEXERIL) 5 MG tablet TAKE 1 TABLET BY MOUTH THREE TIMES A DAY AS NEEDED FOR MUSCLE SPASM 90 tablet 2   diclofenac Sodium (VOLTAREN  ARTHRITIS PAIN) 1 % GEL Apply topically daily.     doxycycline (VIBRA-TABS) 100 MG tablet Take 1 tablet (100 mg total) by mouth 2 (two) times daily. 20 tablet 0   gabapentin (NEURONTIN) 600 MG tablet Take 1 tablet (600 mg total) by mouth 3 (three) times daily. 270 tablet 1   hydrochlorothiazide (MICROZIDE) 12.5 MG capsule TAKE 1 CAPSULE(12.5 MG) BY MOUTH DAILY (Patient taking differently: Take 12.5 mg by mouth daily. TAKE 1 CAPSULE(12.5 MG) BY MOUTH DAILY) 90 capsule 3   losartan (COZAAR) 100 MG tablet TAKE 1 TABLET BY MOUTH EVERY DAY 90 tablet 3   methotrexate (RHEUMATREX) 2.5 MG tablet TAKE 4 TABLETS (10 MG TOTAL) BY MOUTH ONCE A WEEK 48 tablet 0   metoCLOPramide (REGLAN) 10 MG tablet Take 1 tablet (10 mg total) by mouth 2 (two) times daily. 180 tablet 0   Multiple Vitamin (MULTIVITAMIN WITH MINERALS) TABS tablet Take 1 tablet by mouth daily.     nystatin (MYCOSTATIN) 100000 UNIT/ML suspension Take 5 mLs (500,000 Units total) by mouth 4 (four) times daily. 60 mL 0   oxyCODONE-acetaminophen (PERCOCET) 5-325 MG tablet Take 1 tablet by mouth every 6 (six) hours as needed for severe pain (pain score 7-10). 120 tablet 0   pantoprazole (PROTONIX) 40 MG tablet TAKE 1 TABLET BY MOUTH TWICE A DAY 180 tablet 0   traZODone (DESYREL) 50 MG tablet TAKE 1/2 TO 1 TABLET BY MOUTH AT BEDTIME AS NEEDED FOR SLEEP (Patient taking differently: Take 50 mg by mouth at bedtime as needed for sleep.) 90 tablet 1   zolpidem (AMBIEN) 10 MG tablet TAKE 1 TABLET BY MOUTH EVERY DAY AT BEDTIME AS NEEDED (Patient taking differently: Take 10 mg by mouth at bedtime as needed for sleep. TAKE 1 TABLET BY MOUTH EVERY DAY AT BEDTIME AS NEEDED) 90 tablet 1   No current facility-administered medications for this visit.    Allergies as of 06/26/2023 - Review Complete 06/15/2023  Allergen Reaction Noted   Cymbalta [duloxetine hcl] Other (See Comments) 07/08/2019    Family History  Problem Relation Age of Onset   Stroke Mother     Heart disease Father    Lung cancer Sister    Lung cancer Brother    Healthy Daughter    Healthy Daughter    Healthy Daughter    Colon cancer Neg Hx    Esophageal cancer Neg Hx    Pancreatic cancer Neg Hx    Stomach cancer Neg Hx     Social History   Socioeconomic History   Marital status: Married    Spouse name: Not on file   Number of children: 3   Years of education: Not on file   Highest education level: Not on file  Occupational History   Not on file  Tobacco Use   Smoking status: Former    Current packs/day: 0.00    Average packs/day: 0.3 packs/day  for 3.0 years (0.8 ttl pk-yrs)    Types: Cigarettes    Start date: 69    Quit date: 74    Years since quitting: 55.3    Passive exposure: Never   Smokeless tobacco: Never   Tobacco comments:    1970's  Vaping Use   Vaping status: Never Used  Substance and Sexual Activity   Alcohol use: Yes    Comment: occ   Drug use: No   Sexual activity: Yes  Other Topics Concern   Not on file  Social History Narrative   Not on file   Social Drivers of Health   Financial Resource Strain: Low Risk  (08/15/2022)   Overall Financial Resource Strain (CARDIA)    Difficulty of Paying Living Expenses: Not hard at all  Food Insecurity: No Food Insecurity (05/14/2023)   Hunger Vital Sign    Worried About Running Out of Food in the Last Year: Never true    Ran Out of Food in the Last Year: Never true  Transportation Needs: No Transportation Needs (05/14/2023)   PRAPARE - Administrator, Civil Service (Medical): No    Lack of Transportation (Non-Medical): No  Physical Activity: Inactive (08/15/2022)   Exercise Vital Sign    Days of Exercise per Week: 0 days    Minutes of Exercise per Session: 0 min  Stress: No Stress Concern Present (08/15/2022)   [employer] of Occupational Health - Occupational Stress Questionnaire    Feeling of Stress : Not at all  Social Connections: Socially Integrated (05/12/2023)   Social  Connection and Isolation Panel [NHANES]    Frequency of Communication with Friends and Family: More than three times a week    Frequency of Social Gatherings with Friends and Family: More than three times a week    Attends Religious Services: More than 4 times per year    Active Member of [employer] or Organizations: Yes    Attends Engineer, structural: More than 4 times per year    Marital Status: Married  Catering manager Violence: Not At Risk (05/14/2023)   Humiliation, Afraid, Rape, and Kick questionnaire    Fear of Current or Ex-Partner: No    Emotionally Abused: No    Physically Abused: No    Sexually Abused: No    Review of Systems:    Constitutional: No weight loss, fever, chills, weakness or fatigue HEENT: Eyes: No change in vision               Ears, Nose, Throat:  No change in hearing or congestion Skin: No rash or itching Cardiovascular: No chest pain, chest pressure or palpitations   Respiratory: No SOB or cough Gastrointestinal: See HPI and otherwise negative Genitourinary: No dysuria or change in urinary frequency Neurological: No headache, dizziness or syncope Musculoskeletal: No new muscle or joint pain Hematologic: No bleeding or bruising Psychiatric: No history of depression or anxiety    Physical Exam:  Vital signs: There were no vitals taken for this visit.  Constitutional: NAD, alert and cooperative Head:  Normocephalic and atraumatic. Eyes:   PEERL, EOMI. No icterus. Conjunctiva pink. Respiratory: Respirations even and unlabored. Lungs clear to auscultation bilaterally.   No wheezes, crackles, or rhonchi.  Cardiovascular:  Regular rate and rhythm. No peripheral edema, cyanosis or pallor.  Gastrointestinal:  Soft, nondistended, nontender. No rebound or guarding. Normal bowel sounds. No appreciable masses or hepatomegaly. Rectal:  Declines Msk:  Symmetrical without gross deformities. Without edema, no deformity or joint  abnormality.  Neurologic:  Alert  and  oriented x4;  grossly normal neurologically.  Skin:   Dry and intact without significant lesions or rashes. Psychiatric: Oriented to person, place and time. Demonstrates good judgement and reason without abnormal affect or behaviors.  Physical Exam    RELEVANT LABS AND IMAGING: CBC    Component Value Date/Time   WBC 6.5 05/12/2023 0530   RBC 4.04 05/12/2023 0530   HGB 11.4 (L) 05/12/2023 0530   HGB 11.2 11/10/2021 1046   HCT 33.2 (L) 05/12/2023 0530   HCT 33.4 (L) 11/10/2021 1046   PLT 212 05/12/2023 0530   PLT 240 11/10/2021 1046   MCV 82.2 05/12/2023 0530   MCV 87 11/10/2021 1046   MCH 28.2 05/12/2023 0530   MCHC 34.3 05/12/2023 0530   RDW 13.4 05/12/2023 0530   RDW 12.8 11/10/2021 1046   LYMPHSABS 1.1 05/10/2023 2138   LYMPHSABS 1.3 11/10/2021 1046   MONOABS 1.1 (H) 05/10/2023 2138   EOSABS 0.0 05/10/2023 2138   EOSABS 0.3 11/10/2021 1046   BASOSABS 0.1 05/10/2023 2138   BASOSABS 0.1 11/10/2021 1046    CMP     Component Value Date/Time   NA 139 05/12/2023 0530   NA 138 11/10/2021 1046   K 3.3 (L) 05/12/2023 0530   CL 106 05/12/2023 0530   CO2 26 05/12/2023 0530   GLUCOSE 102 (H) 05/12/2023 0530   BUN 9 05/12/2023 0530   BUN 16 11/10/2021 1046   CREATININE 0.74 05/12/2023 0530   CREATININE 0.81 11/08/2022 1333   CALCIUM 9.9 05/12/2023 0530   PROT 7.7 05/10/2023 2138   PROT 7.2 11/10/2021 1046   ALBUMIN 4.2 05/10/2023 2138   ALBUMIN 4.5 11/10/2021 1046   AST 26 05/10/2023 2138   AST 23 12/09/2019 1401   ALT 17 05/10/2023 2138   ALT 21 12/09/2019 1401   ALKPHOS 51 05/10/2023 2138   BILITOT 0.9 05/10/2023 2138   BILITOT 0.6 11/10/2021 1046   BILITOT 0.5 12/09/2019 1401   GFRNONAA >60 05/12/2023 0530   GFRNONAA 64 05/14/2020 0908   GFRAA 74 05/14/2020 0908     Assessment/Plan:   Gastroparesis GERD GES in 2012 with delayed gastric emptying.  Moderately controlled on Reglan 10 Mg twice daily and pantoprazole 40 Mg twice daily.  Recent CTAP  December showing mild gastritis with repeat CTAP February 2025 with no mention of gastritis.  EGD 2017 with retained food in stomach, H. pylori negative gastritis, S ring s/p dilation.  Normocytic anemia Noted in 2024 with Hgb of 10.8 improved to Hgb 12.17 April 2023  Colon cancer screening Due for repeat colonoscopy 2027   Suzanna Erp, PA-C [facility] 06/26/2023, 8:37 AM  Cc: Roslyn Coombe, MD

## 2023-06-28 ENCOUNTER — Encounter: Payer: Self-pay | Admitting: Internal Medicine

## 2023-06-28 ENCOUNTER — Ambulatory Visit (INDEPENDENT_AMBULATORY_CARE_PROVIDER_SITE_OTHER): Admitting: Internal Medicine

## 2023-06-28 VITALS — BP 126/80 | HR 82 | Temp 98.4°F | Ht 68.0 in | Wt 222.0 lb

## 2023-06-28 DIAGNOSIS — E782 Mixed hyperlipidemia: Secondary | ICD-10-CM | POA: Diagnosis not present

## 2023-06-28 DIAGNOSIS — G8929 Other chronic pain: Secondary | ICD-10-CM

## 2023-06-28 DIAGNOSIS — I10 Essential (primary) hypertension: Secondary | ICD-10-CM | POA: Diagnosis not present

## 2023-06-28 DIAGNOSIS — E559 Vitamin D deficiency, unspecified: Secondary | ICD-10-CM

## 2023-06-28 DIAGNOSIS — Z0001 Encounter for general adult medical examination with abnormal findings: Secondary | ICD-10-CM

## 2023-06-28 DIAGNOSIS — E538 Deficiency of other specified B group vitamins: Secondary | ICD-10-CM

## 2023-06-28 DIAGNOSIS — R739 Hyperglycemia, unspecified: Secondary | ICD-10-CM | POA: Diagnosis not present

## 2023-06-28 DIAGNOSIS — M5442 Lumbago with sciatica, left side: Secondary | ICD-10-CM | POA: Diagnosis not present

## 2023-06-28 DIAGNOSIS — F32A Depression, unspecified: Secondary | ICD-10-CM

## 2023-06-28 DIAGNOSIS — M48061 Spinal stenosis, lumbar region without neurogenic claudication: Secondary | ICD-10-CM

## 2023-06-28 DIAGNOSIS — Z1231 Encounter for screening mammogram for malignant neoplasm of breast: Secondary | ICD-10-CM

## 2023-06-28 LAB — URINALYSIS, ROUTINE W REFLEX MICROSCOPIC
Bilirubin Urine: NEGATIVE
Hgb urine dipstick: NEGATIVE
Ketones, ur: NEGATIVE
Leukocytes,Ua: NEGATIVE
Nitrite: NEGATIVE
RBC / HPF: NONE SEEN (ref 0–?)
Specific Gravity, Urine: 1.015 (ref 1.000–1.030)
Total Protein, Urine: NEGATIVE
Urine Glucose: NEGATIVE
Urobilinogen, UA: 0.2 (ref 0.0–1.0)
pH: 7 (ref 5.0–8.0)

## 2023-06-28 LAB — CBC WITH DIFFERENTIAL/PLATELET
Basophils Absolute: 0.1 10*3/uL (ref 0.0–0.1)
Basophils Relative: 2.3 % (ref 0.0–3.0)
Eosinophils Absolute: 0.2 10*3/uL (ref 0.0–0.7)
Eosinophils Relative: 5.4 % — ABNORMAL HIGH (ref 0.0–5.0)
HCT: 32.9 % — ABNORMAL LOW (ref 36.0–46.0)
Hemoglobin: 11.1 g/dL — ABNORMAL LOW (ref 12.0–15.0)
Lymphocytes Relative: 30.8 % (ref 12.0–46.0)
Lymphs Abs: 0.9 10*3/uL (ref 0.7–4.0)
MCHC: 33.6 g/dL (ref 30.0–36.0)
MCV: 85.8 fl (ref 78.0–100.0)
Monocytes Absolute: 0.6 10*3/uL (ref 0.1–1.0)
Monocytes Relative: 20.8 % — ABNORMAL HIGH (ref 3.0–12.0)
Neutro Abs: 1.2 10*3/uL — ABNORMAL LOW (ref 1.4–7.7)
Neutrophils Relative %: 40.7 % — ABNORMAL LOW (ref 43.0–77.0)
Platelets: 538 10*3/uL — ABNORMAL HIGH (ref 150.0–400.0)
RBC: 3.84 Mil/uL — ABNORMAL LOW (ref 3.87–5.11)
RDW: 14 % (ref 11.5–15.5)
WBC: 2.9 10*3/uL — ABNORMAL LOW (ref 4.0–10.5)

## 2023-06-28 LAB — LIPID PANEL
Cholesterol: 152 mg/dL (ref 0–200)
HDL: 72.1 mg/dL (ref >39.00)
LDL Cholesterol: 67 mg/dL (ref 0–99)
NonHDL: 79.56
Total CHOL/HDL Ratio: 2
Triglycerides: 61 mg/dL (ref 0.0–149.0)
VLDL: 12.2 mg/dL (ref 0.0–40.0)

## 2023-06-28 LAB — HEPATIC FUNCTION PANEL
ALT: 19 U/L (ref 0–35)
AST: 23 U/L (ref 0–37)
Albumin: 4 g/dL (ref 3.5–5.2)
Alkaline Phosphatase: 58 U/L (ref 39–117)
Bilirubin, Direct: 0.1 mg/dL (ref 0.0–0.3)
Total Bilirubin: 0.4 mg/dL (ref 0.2–1.2)
Total Protein: 7 g/dL (ref 6.0–8.3)

## 2023-06-28 LAB — BASIC METABOLIC PANEL WITH GFR
BUN: 14 mg/dL (ref 6–23)
CO2: 31 meq/L (ref 19–32)
Calcium: 9.6 mg/dL (ref 8.4–10.5)
Chloride: 103 meq/L (ref 96–112)
Creatinine, Ser: 0.82 mg/dL (ref 0.40–1.20)
GFR: 73.27 mL/min (ref >60.00)
Glucose, Bld: 92 mg/dL (ref 70–99)
Potassium: 3.4 meq/L — ABNORMAL LOW (ref 3.5–5.1)
Sodium: 141 meq/L (ref 135–145)

## 2023-06-28 LAB — TSH: TSH: 0.4 u[IU]/mL (ref 0.35–5.50)

## 2023-06-28 LAB — VITAMIN D 25 HYDROXY (VIT D DEFICIENCY, FRACTURES): VITD: 24.17 ng/mL — ABNORMAL LOW (ref 30.00–100.00)

## 2023-06-28 LAB — MICROALBUMIN / CREATININE URINE RATIO
Creatinine,U: 46.9 mg/dL
Microalb Creat Ratio: 112.7 mg/g — ABNORMAL HIGH (ref 0.0–30.0)
Microalb, Ur: 5.3 mg/dL — ABNORMAL HIGH (ref 0.0–1.9)

## 2023-06-28 LAB — HEMOGLOBIN A1C: Hgb A1c MFr Bld: 5.5 % (ref 4.6–6.5)

## 2023-06-28 LAB — VITAMIN B12: Vitamin B-12: 388 pg/mL (ref 211–911)

## 2023-06-28 MED ORDER — BUPROPION HCL ER (XL) 150 MG PO TB24: 150.0000 mg | ORAL_TABLET | Freq: Every day | ORAL | 3 refills | Status: AC

## 2023-06-28 NOTE — Patient Instructions (Addendum)
 Please take all new medication as prescribed - the wellbutrin xl 150 mg per day  Please continue all other medications as before, and refills have been done if requested.  Please have the pharmacy call with any other refills you may need.  Please continue your efforts at being more active, low cholesterol diet, and weight control.  You are otherwise up to date with prevention measures today.  Please keep your appointments with your specialists as you may have planned  You will be contacted regarding the referral for: Physical Therapy for your back, and Mammogram  Please go to the LAB at the blood drawing area for the tests to be done  You will be contacted by phone if any changes need to be made immediately.  Otherwise, you will receive a letter about your results with an explanation, but please check with MyChart first.  Please make an Appointment to return in 6 months, or sooner if needed

## 2023-06-28 NOTE — Progress Notes (Signed)
 Patient ID: Pamela [name], female   DOB: Dec 19, 1955, 68 y.o.   MRN: [id number]         Chief Complaint:: wellness exam and depression, lbp, hld, hyperglycemia, low vit d       HPI:  Pamela [name] is a 68 y.o. female here for wellness exam; due for mammogram, o/w up to date                        Also Pt denies chest pain, increased sob or doe, wheezing, orthopnea, PND, increased LE swelling, palpitations, dizziness or syncope.   Pt denies polydipsia, polyuria, or new focal neuro s/s.    Pt denies fever, wt loss, night sweats, loss of appetite, or other constitutional symptoms  Has had mild worsening depressive symptoms, but no suicidal ideation, or panic; Pt continues to have recurring LBP without change in severity, bowel or bladder change, fever, wt loss,  worsening LE pain/numbness/weakness, gait change or falls - has been recommended for surgury but she continues to defer.     Wt Readings from Last 3 Encounters:  06/28/23 222 lb (100.7 kg)  06/15/23 222 lb 2 oz (100.8 kg)  05/10/23 235 lb 14.3 oz (107 kg)   BP Readings from Last 3 Encounters:  06/28/23 126/80  06/15/23 (!) 148/88  05/12/23 116/71   Immunization History  Administered Date(s) Administered   Fluad Quad(high Dose 65+) 04/16/2020, 12/24/2020, 12/19/2021   Fluad Trivalent(High Dose 65+) 12/25/2022   Influenza Whole 11/12/2012   Influenza,inj,Quad PF,6+ Mos 11/29/2016, 12/11/2017, 12/25/2018   Influenza-Unspecified 12/15/2013, 12/12/2015, 11/29/2016   PNEUMOCOCCAL CONJUGATE-20 06/15/2022   Td 11/03/2002   Tdap 09/23/2015   Zoster Recombinant(Shingrix ) 03/22/2018, 08/20/2018   Zoster, Live 08/12/2012   Health Maintenance Due  Topic Date Due   MAMMOGRAM  02/03/2023   Medicare Annual Wellness (AWV)  08/15/2023      Past Medical History:  Diagnosis Date   ALLERGIC RHINITIS 12/18/2006   Qualifier: Diagnosis of  By: Sharlette Dayhoff CMA, [place]     Anxiety    Arthritis    DEPRESSION, SITUATIONAL 03/15/2007   Qualifier:  Diagnosis of  By: Marthe Slain    GERD (gastroesophageal reflux disease)    Headache    occasional   History of kidney stones    HYPERLIPIDEMIA 12/18/2006   Qualifier: History of  By: Sharlette Dayhoff CMA, [place]     Hypertension    HYPERTENSION 12/18/2006   Qualifier: Diagnosis of  By: Sharlette Dayhoff CMA, [place]     INSOMNIA-SLEEP DISORDER-UNSPEC 08/25/2009   Qualifier: Diagnosis of  By: Autry Legions MD, [place] Aures    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 Bernard Brick    reports that she quit smoking about 55 years ago. Her smoking use included cigarettes. She started smoking about 58 years ago. She has a  0.8 pack-year smoking history. She has never been exposed to tobacco smoke. She has never used smokeless tobacco. She reports current alcohol use. She reports that she does not use drugs. family history includes Healthy in her daughter, daughter, and daughter; Heart disease in her father; Lung cancer in her brother and sister; Stroke in her mother. Allergies  Allergen Reactions   Cymbalta  [Duloxetine  Hcl] Other (See Comments)    Feeling weird   Current Outpatient Medications on File Prior to Visit  Medication Sig Dispense Refill   adalimumab  (HUMIRA , 2 PEN,) 40 MG/0.4ML pen Inject 0.4 mLs (40 mg total) into the skin every 14 (fourteen) days. 6 each 0   albuterol  (VENTOLIN  HFA) 108 (90 Base) MCG/ACT inhaler Inhale 2 puffs into the lungs every 6 (six) hours as needed  for wheezing or shortness of breath. 6.7 g 2   ALPRAZolam  (XANAX ) 0.25 MG tablet TAKE 1 TABLET BY MOUTH TWICE A DAY AS NEEDED 60 tablet 2   atorvastatin  (LIPITOR) 20 MG tablet TAKE 1 TABLET BY MOUTH EVERY DAY 90 tablet 3   citalopram  (CELEXA ) 40 MG tablet TAKE 1 TABLET BY MOUTH EVERY DAY 90 tablet 2   cyclobenzaprine  (FLEXERIL ) 5 MG tablet TAKE 1 TABLET BY MOUTH THREE TIMES A DAY AS NEEDED FOR MUSCLE SPASM 90 tablet 2   diclofenac  Sodium (VOLTAREN  ARTHRITIS PAIN) 1 % GEL Apply topically daily.     gabapentin  (NEURONTIN ) 600 MG tablet Take 1 tablet (600 mg total) by mouth 3 (three) times daily. 270 tablet 1   hydrochlorothiazide  (MICROZIDE ) 12.5 MG capsule TAKE 1 CAPSULE(12.5 MG) BY MOUTH DAILY (Patient taking differently: Take 12.5 mg by mouth daily. TAKE 1 CAPSULE(12.5 MG) BY MOUTH DAILY) 90 capsule 3   losartan  (COZAAR ) 100 MG tablet TAKE 1 TABLET BY MOUTH EVERY DAY 90 tablet 3   methotrexate  (RHEUMATREX) 2.5 MG tablet TAKE 4 TABLETS (10 MG TOTAL) BY MOUTH ONCE A WEEK 48 tablet 0   metoCLOPramide  (REGLAN ) 10 MG tablet Take 1 tablet (10 mg total) by mouth 2 (two) times daily. 180 tablet 0   Multiple Vitamin (MULTIVITAMIN WITH MINERALS) TABS tablet Take 1 tablet by mouth daily.     nystatin  (MYCOSTATIN ) 100000 UNIT/ML suspension Take 5 mLs (500,000 Units total) by mouth 4 (four) times daily. 60 mL 0   oxyCODONE -acetaminophen  (PERCOCET) 5-325 MG tablet Take 1 tablet by mouth every 6 (six) hours as needed for severe pain (pain score 7-10). 120 tablet 0   pantoprazole  (PROTONIX ) 40 MG tablet TAKE 1 TABLET BY MOUTH TWICE A DAY 180 tablet 0   traZODone  (DESYREL ) 50 MG tablet TAKE 1/2 TO 1 TABLET BY MOUTH AT BEDTIME AS NEEDED FOR SLEEP (Patient taking differently: Take 50 mg by mouth at bedtime as needed for sleep.) 90 tablet 1   zolpidem  (AMBIEN ) 10 MG tablet TAKE 1 TABLET BY MOUTH EVERY DAY AT BEDTIME AS NEEDED (Patient taking differently: Take 10 mg by mouth at bedtime as needed for sleep. TAKE 1 TABLET  BY MOUTH EVERY DAY AT BEDTIME AS NEEDED) 90 tablet 1   No current facility-administered medications on file prior to visit.        ROS:  All others reviewed and negative.  Objective        PE:  BP 126/80 (BP Location: Left Arm, Patient Position: Sitting, Cuff Size: Normal)   Pulse 82   Temp 98.4 F (36.9 C) (Oral)   Ht 5\' 8"  (1.727 m)   Wt 222 lb (100.7 kg)   SpO2 98%  BMI 33.75 kg/m                 Constitutional: Pt appears in NAD               HENT: Head: NCAT.                Right Ear: External ear normal.                 Left Ear: External ear normal.                Eyes: . Pupils are equal, round, and reactive to light. Conjunctivae and EOM are normal               Nose: without d/c or deformity               Neck: Neck supple. Gross normal ROM               Cardiovascular: Normal rate and regular rhythm.                 Pulmonary/Chest: Effort normal and breath sounds without rales or wheezing.                Abd:  Soft, NT, ND, + BS, no organomegaly               Neurological: Pt is alert. At baseline orientation, motor grossly intact               Skin: Skin is warm. No rashes, no other new lesions, LE edema - none               Psychiatric: Pt behavior is normal without agitation ; depressed affect  Micro: none  Cardiac tracings I have personally interpreted today:  none  Pertinent Radiological findings (summarize): none   Lab Results  Component Value Date   WBC 2.9 (L) 06/28/2023   HGB 11.1 (L) 06/28/2023   HCT 32.9 (L) 06/28/2023   PLT 538.0 (H) 06/28/2023   GLUCOSE 92 06/28/2023   CHOL 152 06/28/2023   TRIG 61.0 06/28/2023   HDL 72.10 06/28/2023   LDLDIRECT 109.9 04/03/2012   LDLCALC 67 06/28/2023   ALT 19 06/28/2023   AST 23 06/28/2023   NA 141 06/28/2023   K 3.4 (L) 06/28/2023   CL 103 06/28/2023   CREATININE 0.82 06/28/2023   BUN 14 06/28/2023   CO2 31 06/28/2023   TSH 0.40 06/28/2023   INR 1.1 05/10/2023   HGBA1C 5.5 06/28/2023    MICROALBUR 5.3 (H) 06/28/2023   Assessment/Plan:  Pamela [name] is a 68 y.o. 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  Encounter for well adult exam with abnormal findings Age and sex appropriate education and counseling updated with regular exercise and diet Referrals for preventative services - for mammogram Immunizations addressed - none needed Smoking counseling  - none needed Evidence for depression or other mood disorder - none significant Most recent labs reviewed. I have personally reviewed and have noted: 1) the patient's medical and social history 2) The patient's current medications and supplements 3) The patient's height, weight, and BMI have been recorded in the chart   Spinal stenosis  of lumbar region Pt continues to defer any surgical evaluation  HLD (hyperlipidemia) Lab Results  Component Value Date   LDLCALC 67 06/28/2023   Stable, pt to continue current statin lipitor 20 mg qd   Depression With recent mild worsening, for add wellbutrin  xl 150 mg every day, declines counseling referral  Essential hypertension BP Readings from Last 3 Encounters:  06/28/23 126/80  06/15/23 (!) 148/88  05/12/23 116/71   Stable, pt to continue medical treatment ht 12.5 every day, losaran 100 qd   Hyperglycemia Lab Results  Component Value Date   HGBA1C 5.5 06/28/2023   Stable, pt to continue current medical treatment  - diet, wt control   Vitamin D  deficiency Last vitamin D  Lab Results  Component Value Date   VD25OH 24.17 (L) 06/28/2023    Low, to start oral replacement   Chronic low back pain with left-sided sciatica With mild flare, - for PT evaluation, and flexeril  5 tid prn  Followup: Return in about 6 months (around 12/28/2023).  Rosalia Colonel, MD 06/29/2023 9:46 PM [facility]

## 2023-06-29 ENCOUNTER — Encounter: Payer: Self-pay | Admitting: Internal Medicine

## 2023-06-29 NOTE — Assessment & Plan Note (Signed)
 With mild flare, - for PT evaluation, and flexeril  5 tid prn

## 2023-06-29 NOTE — Assessment & Plan Note (Signed)
 With recent mild worsening, for add wellbutrin  xl 150 mg every day, declines counseling referral

## 2023-06-29 NOTE — Assessment & Plan Note (Signed)
 Lab Results  Component Value Date   HGBA1C 5.5 06/28/2023   Stable, pt to continue current medical treatment  - diet, wt control

## 2023-06-29 NOTE — Assessment & Plan Note (Signed)
Age and sex appropriate education and counseling updated with regular exercise and diet Referrals for preventative services - for mammogram Immunizations addressed - none needed Smoking counseling  - none needed Evidence for depression or other mood disorder - none significant Most recent labs reviewed. I have personally reviewed and have noted: 1) the patient's medical and social history 2) The patient's current medications and supplements 3) The patient's height, weight, and BMI have been recorded in the chart

## 2023-06-29 NOTE — Assessment & Plan Note (Signed)
 Last vitamin D  Lab Results  Component Value Date   VD25OH 24.17 (L) 06/28/2023   Low, to start oral replacement

## 2023-06-29 NOTE — Assessment & Plan Note (Signed)
 Lab Results  Component Value Date   LDLCALC 67 06/28/2023   Stable, pt to continue current statin lipitor 20 mg qd

## 2023-06-29 NOTE — Assessment & Plan Note (Signed)
 BP Readings from Last 3 Encounters:  06/28/23 126/80  06/15/23 (!) 148/88  05/12/23 116/71   Stable, pt to continue medical treatment ht 12.5 every day, losaran 100 qd

## 2023-06-29 NOTE — Assessment & Plan Note (Signed)
 Pt continues to defer any surgical evaluation

## 2023-07-02 ENCOUNTER — Encounter: Payer: Self-pay | Admitting: Physical Medicine & Rehabilitation

## 2023-07-02 ENCOUNTER — Encounter: Attending: Physical Medicine & Rehabilitation | Admitting: Physical Medicine & Rehabilitation

## 2023-07-02 VITALS — BP 144/85 | HR 84 | Ht 68.0 in | Wt 227.0 lb

## 2023-07-02 DIAGNOSIS — Z79891 Long term (current) use of opiate analgesic: Secondary | ICD-10-CM | POA: Diagnosis not present

## 2023-07-02 DIAGNOSIS — G894 Chronic pain syndrome: Secondary | ICD-10-CM | POA: Insufficient documentation

## 2023-07-02 DIAGNOSIS — Z5181 Encounter for therapeutic drug level monitoring: Secondary | ICD-10-CM | POA: Insufficient documentation

## 2023-07-02 DIAGNOSIS — M48062 Spinal stenosis, lumbar region with neurogenic claudication: Secondary | ICD-10-CM | POA: Diagnosis not present

## 2023-07-02 DIAGNOSIS — M545 Low back pain, unspecified: Secondary | ICD-10-CM | POA: Insufficient documentation

## 2023-07-02 DIAGNOSIS — G8929 Other chronic pain: Secondary | ICD-10-CM | POA: Diagnosis not present

## 2023-07-02 MED ORDER — OXYCODONE-ACETAMINOPHEN 5-325 MG PO TABS: 1.0000 | ORAL_TABLET | Freq: Four times a day (QID) | ORAL | 0 refills | Status: DC | PRN

## 2023-07-02 MED ORDER — GABAPENTIN 800 MG PO TABS: 800.0000 mg | ORAL_TABLET | Freq: Three times a day (TID) | ORAL | 3 refills | Status: DC

## 2023-07-02 NOTE — Progress Notes (Signed)
 Subjective:    Patient ID: Pamela [name], female    DOB: 09/06/1955, 68 y.o.   MRN: [id number]  HPI   Pamela [name] is a 68 y.o. year old female  who  has a past medical history of ALLERGIC RHINITIS (12/18/2006), Anxiety, Arthritis, DEPRESSION, SITUATIONAL (03/15/2007), GERD (gastroesophageal reflux disease), Headache, History of kidney stones, HYPERLIPIDEMIA (12/18/2006), Hypertension, HYPERTENSION (12/18/2006), INSOMNIA-SLEEP DISORDER-UNSPEC (08/25/2009), 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) (04/03/2012), Pneumonia, and RENAL CALCULUS, HX OF (12/18/2006).   They are presenting to PM&R clinic as a new patient for pain management evaluation. They were referred by Dr. Rosalia Colonel for treatment of chronic pain and spinal stenosis pain.  Pamela [name] reports that she developed pain about 2 years ago in her lower back after a fall.  She says she was told in the ER that she had a pinched nerve in her back.  Back pain has worsened gradually since this time.  Pain does not radiate down her legs.  It is worsened with activities and spinal extension.  She also has less severe pain in her left knee.  She had a right TKA but would like to avoid having surgery on her left side.  (Addendum, pain is worse in R knee where she has OA, hx L TKA) She is getting Orthovisc injections for her right knee by rheumatology.  She has intermittent pain due to right trochanteric bursitis.  She reports this is currently under control.  She also has occasional pain in her shoulders, history of left tendinopathy followed by rheumatology.  She reports this is currently under control.  She is followed by rheumatology for rheumatoid arthritis and osteoarthritis.   She reports history of depression.  His mood is up-and-down.  No SI or HI.    Red flag symptoms: No red flags for back pain endorsed in Hx or ROS  Medications tried:  Topical medications- voltaren  helped a little  Nsaids - Didn't help Tylenol  -  denies benefit  Opiates  Hydrocodone  10 mg 4 times daily as needed-reports this is not keeping her pain under control, she says it does ease the pain Tramadol -reports minimal benefit Gabapentin  - For nerve pain L foot-started started after surgery/ She had drop foot that improved. Foot pain is controlled by gabapentin  TCAs  - Denies  SNRIs  - Denies  Flexeril  -does not help much    Other treatments: PT- denies  TENs unit - Denies  Injections- Denies  Surgery reports history of cervical spine surgery x3   Prior UDS results:     Component Value Date/Time   LABOPIA NONE DETECTED 05/11/2023 0000   COCAINSCRNUR NONE DETECTED 05/11/2023 0000   LABBENZ NONE DETECTED 05/11/2023 0000   AMPHETMU NONE DETECTED 05/11/2023 0000   THCU NONE DETECTED 05/11/2023 0000   LABBARB NONE DETECTED 05/11/2023 0000       Interval history 02/16/2023 Pamela [name] reports that her pain has been worse since she ran out of hydrocodone  10 mg.  She she has more trouble doing things around the house and is less active since this time.  She continues to have pain in her lower back.  She is also been having pain in her knees.  She reports history of bilateral knee OA and has a history of left TKA.  She had Orthovisc injections completed by rheumatology for her right knee.   Interval History 04/03/23 Patient had a recent fall when she took a misstep while walking up the stairs at her home.  She fell forward and hit her head on a piece of furniture.  She went to the ER, had CT scans of her head and neck done without acute findings.  She reports that Percocet 5 was helping her pain, it was not relieving her pain completely but was keeping it more tolerable.  She denies any side effects with the medication.  She continues to report difficulty getting therapy or MRI due to limited money for these treatments.  Interval History 07/02/23 Patient reports continued pain in her lower back.  She is also having dull achy pain  throughout her bilateral legs.  Pain is worsened with ambulation.  She reports she has been out of her oxycodone  for a few weeks although PDMP indicates refill June 11, 2023.  She thinks she might have picked this up with her other meds and not realized it.  She reports before running out oxycodone  was helping to control her pain and was not causing any significant side effects.     Pain Inventory Average Pain 8 Pain Right Now 9 My pain is aching  In the last 24 hours, has pain interfered with the following? General activity 3 Relation with others 10 Enjoyment of life 2 What TIME of day is your pain at its worst? daytime Sleep (in general) Poor  Pain is worse with: walking and standing Pain improves with: rest and medication Relief from Meds: 9    Family History  Problem Relation Age of Onset   Stroke Mother    Heart disease Father    Lung cancer Sister    Lung cancer Brother    Healthy Daughter    Healthy Daughter    Healthy Daughter    Colon cancer Neg Hx    Esophageal cancer Neg Hx    Pancreatic cancer Neg Hx    Stomach cancer Neg Hx    Social History   Socioeconomic History   Marital status: Married    Spouse name: Not on file   Number of children: 3   Years of education: Not on file   Highest education level: Not on file  Occupational History   Not on file  Tobacco Use   Smoking status: Former    Current packs/day: 0.00    Average packs/day: 0.3 packs/day for 3.0 years (0.8 ttl pk-yrs)    Types: Cigarettes    Start date: 75    Quit date: 1970    Years since quitting: 55.3    Passive exposure: Never   Smokeless tobacco: Never   Tobacco comments:    1970's  Vaping Use   Vaping status: Never Used  Substance and Sexual Activity   Alcohol use: Yes    Comment: occ   Drug use: No   Sexual activity: Yes  Other Topics Concern   Not on file  Social History Narrative   Not on file   Social Drivers of Health   Financial Resource Strain: Low Risk   (08/15/2022)   Overall Financial Resource Strain (CARDIA)    Difficulty of Paying Living Expenses: Not hard at all  Food Insecurity: No Food Insecurity (05/14/2023)   Hunger Vital Sign    Worried About Running Out of Food in the Last Year: Never true    Ran Out of Food in the Last Year: Never true  Transportation Needs: No Transportation Needs (05/14/2023)   PRAPARE - Administrator, Civil Service (Medical): No    Lack of Transportation (Non-Medical): No  Physical Activity: Inactive (08/15/2022)  Exercise Vital Sign    Days of Exercise per Week: 0 days    Minutes of Exercise per Session: 0 min  Stress: No Stress Concern Present (08/15/2022)   [employer] of Occupational Health - Occupational Stress Questionnaire    Feeling of Stress : Not at all  Social Connections: Socially Integrated (05/12/2023)   Social Connection and Isolation Panel [NHANES]    Frequency of Communication with Friends and Family: More than three times a week    Frequency of Social Gatherings with Friends and Family: More than three times a week    Attends Religious Services: More than 4 times per year    Active Member of [employer] or Organizations: Yes    Attends Engineer, structural: More than 4 times per year    Marital Status: Married   Past Surgical History:  Procedure Laterality Date   CERVICAL DISC SURGERY     x 3   CONVERSION TO TOTAL KNEE Left 10/09/2016   Procedure: Conversion left uni compartment arthroplasty to total knee arthroplasty;  Surgeon: Claiborne Crew, MD;  Location: [facility];  Service: Orthopedics;  Laterality: Left;  90 mins   KNEE ARTHROSCOPY     partial   TOOTH EXTRACTION  2024   x2   total left knee arthoplasty     10/09/16 Dr. Bernard Brick   Past Medical History:  Diagnosis Date   ALLERGIC RHINITIS 12/18/2006   Qualifier: Diagnosis of  By: Sharlette Dayhoff CMA, [place]     Anxiety    Arthritis    DEPRESSION, SITUATIONAL 03/15/2007   Qualifier: Diagnosis of  By: Marthe Slain     GERD (gastroesophageal reflux disease)    Headache    occasional   History of kidney stones    HYPERLIPIDEMIA 12/18/2006   Qualifier: History of  By: Sharlette Dayhoff CMA, [place]     Hypertension    HYPERTENSION 12/18/2006   Qualifier: Diagnosis of  By: Sharlette Dayhoff CMA, [place]     INSOMNIA-SLEEP DISORDER-UNSPEC 08/25/2009   Qualifier: Diagnosis of  By: Autry Legions MD, Alveda Aures    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) 04/03/2012   Pneumonia    RENAL CALCULUS, HX OF 12/18/2006   Qualifier: Diagnosis of  By: Sharlette Dayhoff CMA, [place]     There were no vitals taken for this visit.  Opioid Risk Score:   Fall Risk Score:  `1  Depression screen [facility] 2/9     06/28/2023   10:15 AM 04/03/2023   11:15 AM 02/16/2023   10:01 AM 01/18/2023   10:35 AM 01/03/2023    1:51 PM 12/25/2022   11:04 AM 08/17/2022    8:09 AM  Depression screen PHQ 2/9  Decreased Interest 0 0 3 2 0 0 0  Down, Depressed, Hopeless 0 0 3 2 0 0 0  PHQ - 2 Score 0 0 6 4 0 0 0  Altered sleeping    2 0 0   Tired, decreased energy    2 0 0   Change in appetite    2 0 0   Feeling bad or failure about yourself     1 0 0   Trouble concentrating    1 0 0   Moving slowly or fidgety/restless    2 0 0   Suicidal thoughts    0 0 0   PHQ-9 Score    14 0 0   Difficult doing work/chores     Not difficult at all Somewhat difficult  Review of Systems  Constitutional:  Negative for appetite change.       Poor appetite  HENT: Negative.    Eyes: Negative.   Respiratory: Negative.    Gastrointestinal: Negative.   Endocrine: Negative.   Genitourinary: Negative.   Musculoskeletal:  Positive for back pain and gait problem.       Pain in both knees & hip, pain in both lower legs  Skin: Negative.   Allergic/Immunologic: Negative.   Hematological: Negative.   Psychiatric/Behavioral:  Negative for agitation and dysphoric mood.   All other systems reviewed and are negative.      Objective:   Physical Exam  Gen: no distress, normal  appearing HEENT: oral mucosa pink and moist, NCAT Chest: normal effort, normal rate of breathing Abd: soft, non-distended Ext: no edema Psych: pleasant, normal affect Skin: intact Neuro: Alert and awake, follows commands, cranial nerves II through XII grossly intact, normal speech and language RUE: 5/5 Deltoid, 5/5 Biceps, 5/5 Triceps, 5/5 Wrist Ext, 5/5 Grip LUE: 5/5 Deltoid, 5/5 Biceps, 5/5 Triceps, 5/5 Wrist Ext, 5/5 Grip RLE: HF 5/5, KE 5/5, ADF 5/5, APF 5/5 LLE: HF 5/5, KE 5/5, ADF 5/5, APF 5/5 Sensory exam normal for light touch and pain in all 4 limbs.   Musculoskeletal:  SLR negative  Pain with spinal extension present Facet loading positive Lumbar paraspinal tenderness  Mild TTP R knee  Left knee TKA scar-mild TTP left knee Decreased range of motion at C-spine and L-spine No actively red/inflamed joints noted    Xray L spine 05/12/2020 IMPRESSION: 1. No acute findings. 2. Mild multilevel degenerative disc disease with disc space narrowing at L3-4. 3. Facet hypertrophy in the lower lumbar spine. 4. Levoconvex rotatory scoliosis.  C spine/head Ct 03/22/23 CT CERVICAL SPINE FINDINGS   Alignment: No traumatic listhesis.   Skull base and vertebrae: No acute fracture or suspicious osseous lesion. ACDF C4-C5, with posterior fusion C4-C6 and solid osseous fusion C4-C7. Evaluation is somewhat limited   Soft tissues and spinal canal: By beam hardening artifact from the patient's spinal hardware. Within this limitation, no prevertebral fluid or swelling and no visible canal hematoma.   Disc levels: Degenerative changes in the cervical spine.No high-grade spinal canal stenosis.   Upper chest: No focal pulmonary opacity or pleural effusion.   IMPRESSION: 1. No acute intracranial process. 2. No acute fracture or traumatic listhesis in the cervical spine.    MRI L spine 08/05/19 IMPRESSION: 1. Severe spinal canal stenosis and right neural foraminal stenosis at L3-L4  due to combination of disc bulge and severe facet arthrosis. 2. Mild spinal canal stenosis at L2-L3. 3. Left lateral recess narrowing at L4-L5 could contribute to left L5 radiculopathy.        Assessment & Plan:   1) Chronic lower back -Prior x-ray with multilevel DDD and evidence of facet hypertrophy, scoliosis- She does have axial pain that is likely facet mediated   2) Depression, denies SI or HI -On Celexa  40 mg daily  3) Rheumatoid arthritis followed by rheumatology  4) History of MGUS, review of notes indicate due for follow-up and PCP has referred to hematology  5) R knee OA status, post left TKA  6) B/L leg pain MRI 2021 with severe spinal canal stenosis- Likely contributing to any pain her her legs  Plan -Hold off on TENS unit for now due to MDS -Continue UDS and pill counts.  Continue PDMP monitoring.  Pain contract completed prior visit. -Encouraged her to complete MRI, she has  not yet completed due to cost- Reordered MRI today. Suspect spinal stenosis may be contributing to leg pain -Prior UDS consistent overall other than she did have some alcohol metabolites noted-she denies drinking regularly.  -Recheck UDS -Suspect lower back pain may be realted to facet joint degeneration, MMB/RFA discussed -Physical therapy placed for her back pain-Limited by cost -Continue Percocet 5 every 6 hours as needed.  #120  ordered -Increase gabapentin  to 800mg  TID -Prior visit, list of home exercises for lower back pain provided, advised trying 3 times a week. She reports she tried for  6 weeks without benefit.  -Pt reports she has been out of oxycodone  for few weeks, PDMP indicates refill 06/11/23.  Pt is unsure what happened and seems to not be aware she picked this up. Discussed she needs to be responsible with the medications.  Verbal warning given today.  -She is not interested in surgical evaluation

## 2023-07-04 LAB — DRUG TOX MONITOR 1 W/CONF, ORAL FLD

## 2023-07-04 LAB — DRUG TOX ALC METAB W/CON, ORAL FLD: Alcohol Metabolite: NEGATIVE ng/mL (ref <25)

## 2023-07-07 ENCOUNTER — Other Ambulatory Visit: Payer: Self-pay | Admitting: Rheumatology

## 2023-07-07 ENCOUNTER — Other Ambulatory Visit: Payer: Self-pay | Admitting: Internal Medicine

## 2023-07-09 ENCOUNTER — Other Ambulatory Visit: Payer: Self-pay

## 2023-07-11 ENCOUNTER — Telehealth: Payer: Self-pay | Admitting: Physical Medicine & Rehabilitation

## 2023-07-11 NOTE — Telephone Encounter (Signed)
 Patient called in requesting medication refill on  oxyCODONE -acetaminophen  (PERCOCET) 5-325 MG tablet,  patient has 5 left and [facility]

## 2023-07-12 ENCOUNTER — Other Ambulatory Visit: Payer: Self-pay | Admitting: Physician Assistant

## 2023-07-13 NOTE — Telephone Encounter (Signed)
 Dr. Estill Hemming sent in a prescription on 07/02/23. Called patient and advised her to call the pharmacy

## 2023-07-21 ENCOUNTER — Other Ambulatory Visit: Payer: Self-pay | Admitting: Internal Medicine

## 2023-07-23 ENCOUNTER — Telehealth: Payer: Self-pay

## 2023-07-23 ENCOUNTER — Other Ambulatory Visit (HOSPITAL_COMMUNITY): Payer: Self-pay

## 2023-07-23 NOTE — Telephone Encounter (Signed)
 Pharmacy Patient Advocate Encounter   Received notification from Pt Calls Messages that prior authorization for Cyclobenzaprine  5mg  tabs is required/requested.   Insurance verification completed.   The patient is insured through [facility] .   Per test claim: PA required; PA submitted to above mentioned insurance via CoverMyMeds Key/confirmation #/EOC [id number] Status is pending

## 2023-07-23 NOTE — Telephone Encounter (Signed)
 Please clarify which medication. I have submitted a prior authorization for the cyclobenzaprine  and this is asking for tizanidine. Thanks

## 2023-07-24 ENCOUNTER — Other Ambulatory Visit (HOSPITAL_COMMUNITY): Payer: Self-pay

## 2023-07-24 NOTE — Telephone Encounter (Signed)
 Pharmacy Patient Advocate Encounter  Insurance verification completed.   The patient is insured through [employer] test claim for Tizanidine 2mg  caps. Currently a quantity of 30 is a 30 day supply and the co-pay is $6.35 .   This test claim was processed through [facility]- copay amounts may vary at other pharmacies due to pharmacy/plan contracts, or as the patient moves through the different stages of their insurance plan.    The Tizanidine did not require a PA and the provider would need to send in a new order for the patient.

## 2023-07-24 NOTE — Telephone Encounter (Signed)
 Pharmacy Patient Advocate Encounter  Received notification from [facility] that Prior Authorization for Cyclobenzaprine  5mg  tabs  has been DENIED.  Full denial letter will be uploaded to the media tab. See denial reason below.   PA #/Case ID/Reference #: [id number]

## 2023-07-25 ENCOUNTER — Other Ambulatory Visit (HOSPITAL_COMMUNITY): Payer: Self-pay

## 2023-07-25 MED ORDER — TIZANIDINE HCL 2 MG PO CAPS
2.0000 mg | ORAL_CAPSULE | Freq: Three times a day (TID) | ORAL | 1 refills | Status: DC
Start: 2023-07-25 — End: 2023-08-17

## 2023-07-25 NOTE — Telephone Encounter (Signed)
 Ok this was changed to tizinidine thanks

## 2023-07-25 NOTE — Addendum Note (Signed)
 Addended by: Roslyn Coombe on: 07/25/2023 06:25 PM   Modules accepted: Orders

## 2023-07-25 NOTE — Telephone Encounter (Signed)
 Pharmacy Patient Advocate Encounter  Insurance verification completed.   The patient is insured through [employer] test claim for Tizanidine 2mg  caps. Currently a quantity of 30 caps is a 30 day supply and the co-pay is $6.35 .   This test claim was processed through [facility]- copay amounts may vary at other pharmacies due to pharmacy/plan contracts, or as the patient moves through the different stages of their insurance plan.

## 2023-07-26 NOTE — Telephone Encounter (Signed)
 Pamela [name]

## 2023-07-30 ENCOUNTER — Encounter: Attending: Physical Medicine & Rehabilitation | Admitting: Registered Nurse

## 2023-07-30 DIAGNOSIS — Z79891 Long term (current) use of opiate analgesic: Secondary | ICD-10-CM | POA: Insufficient documentation

## 2023-07-30 DIAGNOSIS — M545 Low back pain, unspecified: Secondary | ICD-10-CM | POA: Insufficient documentation

## 2023-07-30 DIAGNOSIS — Z5181 Encounter for therapeutic drug level monitoring: Secondary | ICD-10-CM | POA: Insufficient documentation

## 2023-07-30 DIAGNOSIS — G8929 Other chronic pain: Secondary | ICD-10-CM | POA: Insufficient documentation

## 2023-07-30 DIAGNOSIS — M48062 Spinal stenosis, lumbar region with neurogenic claudication: Secondary | ICD-10-CM | POA: Insufficient documentation

## 2023-07-30 DIAGNOSIS — G894 Chronic pain syndrome: Secondary | ICD-10-CM | POA: Insufficient documentation

## 2023-07-31 ENCOUNTER — Other Ambulatory Visit (HOSPITAL_COMMUNITY): Payer: Self-pay

## 2023-07-31 ENCOUNTER — Ambulatory Visit: Payer: Self-pay

## 2023-07-31 ENCOUNTER — Telehealth: Payer: Self-pay

## 2023-07-31 NOTE — Telephone Encounter (Signed)
 Copied from CRM [id number]. Topic: Clinical - Red Word Triage >> Jul 31, 2023 11:21 AM Pamela [name] wrote: Red Word that prompted transfer to Nurse Triage: Extreme Pain in your knee from knee cap replacement wants to been seen as soon as she can  Chief Complaint: left knee pain Symptoms: pain Frequency: constant when walking Pertinent Negatives: Patient denies fever, joint swelling Disposition: [] ED /[] Urgent Care (no appt availability in office) / [x] Appointment(In office/virtual)/ []  [facility] [facility]/ [] Home Care/ [] Refused Recommended Disposition /[] [place] [facility]/ []  Follow-up with PCP Additional Notes: apt made for tomorrow; care advice given, denies questions; instructed to go to ER if becomes worse.   Reason for Disposition  [1] MODERATE pain (e.g., interferes with normal activities, limping) AND [2] present > 3 days  Answer Assessment - Initial Assessment Questions 1. LOCATION and RADIATION: "Where is the pain located?"      Left knee 2. QUALITY: "What does the pain feel like?"  (e.g., sharp, dull, aching, burning)     hurting 3. SEVERITY: "How bad is the pain?" "What does it keep you from doing?"   (Scale 1-10; or mild, moderate, severe)   -  MILD (1-3): doesn't interfere with normal activities    -  MODERATE (4-7): interferes with normal activities (e.g., work or school) or awakens from sleep, limping    -  SEVERE (8-10): excruciating pain, unable to do any normal activities, unable to walk     Walking, 10/10 4. ONSET: "When did the pain start?" "Does it come and go, or is it there all the time?"     Last month 5. RECURRENT: "Have you had this pain before?" If Yes, ask: "When, and what happened then?"     yes 6. SETTING: "Has there been any recent work, exercise or other activity that involved that part of the body?"      no 7. AGGRAVATING FACTORS: "What makes the knee pain worse?" (e.g., walking, climbing stairs, running)     walking 8. ASSOCIATED SYMPTOMS: "Is  there any swelling or redness of the knee?"     no 9. OTHER SYMPTOMS: "Do you have any other symptoms?" (e.g., chest pain, difficulty breathing, fever, calf pain)     no 10. PREGNANCY: "Is there any chance you are pregnant?" "When was your last menstrual period?"       na  Protocols used: Knee Pain-A-AH

## 2023-07-31 NOTE — Telephone Encounter (Signed)
 Pharmacy Patient Advocate Encounter   Received notification from Pt Calls Messages that prior authorization for Tizanidine  2mg  caps is required/requested.   Insurance verification completed.   The patient is insured through [facility] .   Per test claim: PA required; PA submitted to above mentioned insurance via CoverMyMeds Key/confirmation #/EOC [id number] Status is pending

## 2023-07-31 NOTE — Telephone Encounter (Signed)
 Pharmacy Patient Advocate Encounter  Received notification from [facility] that Prior Authorization for Tizanidine  2mg  caps has been CANCELLED due to the patient currently has access to the requested medication and a prior authorization is not needed for the medication.   Per test claim: Refill too soon, last filled 07/25/23 next fill 08/09/23.

## 2023-08-01 ENCOUNTER — Ambulatory Visit: Admitting: Internal Medicine

## 2023-08-08 ENCOUNTER — Other Ambulatory Visit: Payer: Self-pay | Admitting: Rheumatology

## 2023-08-08 ENCOUNTER — Other Ambulatory Visit: Payer: Self-pay | Admitting: Internal Medicine

## 2023-08-10 NOTE — Progress Notes (Signed)
 Office Visit Note  Patient: Pamela [name]             Date of Birth: [birth year]           MRN: [id number]             PCP: Roslyn Coombe, MD Referring: Roslyn Coombe, MD Visit Date: 08/13/2023 Occupation: @GUAROCC @  Subjective:  Pain in both knees   History of Present Illness: MAMTA RIMMER is a 68 y.o. female with history of seropositive rheumatoid arthritis and osteoarthritis.  Patient remains on  Methotrexate  4 tablets by mouth once weekly and folic acid  1 mg po daily.  She is tolerating methotrexate  without any side of asked and has not had any recent gaps in therapy.  Patient reports that she discontinued Humira  about 2 months ago due to not feeling that it was providing any clinical benefit.  Patient states that about 2 weeks ago she started to have a recurrence of pain in both knees.  She is been having difficulty ambulating due to severity of symptoms.  Her morning stiffness has been lasting for about 2 hours daily.  She has had difficulty climbing steps and rising from a seated position.  She has noticed warmth in both knee joints.  Patient had previously noticed a significant improvement in her right knee joint after undergoing Orthovisc injections in October 2024.   Activities of Daily Living:  Patient reports morning stiffness for 2 hours.   Patient Reports nocturnal pain.  Difficulty dressing/grooming: Denies Difficulty climbing stairs: Reports Difficulty getting out of chair: Reports Difficulty using hands for taps, buttons, cutlery, and/or writing: Denies  Review of Systems  Constitutional:  Positive for fatigue.  HENT:  Positive for mouth dryness. Negative for mouth sores.   Eyes:  Positive for dryness.  Respiratory:  Negative for shortness of breath.   Cardiovascular:  Negative for chest pain and palpitations.  Gastrointestinal:  Negative for blood in stool, constipation and diarrhea.  Endocrine: Positive for increased urination.  Genitourinary:  Negative for  involuntary urination.  Musculoskeletal:  Positive for joint pain, joint pain, joint swelling, muscle weakness, morning stiffness and muscle tenderness. Negative for gait problem, myalgias and myalgias.  Skin:  Positive for sensitivity to sunlight. Negative for color change, rash and hair loss.  Allergic/Immunologic: Negative for susceptible to infections.  Neurological:  Positive for headaches. Negative for dizziness.  Hematological:  Negative for swollen glands.  Psychiatric/Behavioral:  Positive for depressed mood and sleep disturbance. The patient is nervous/anxious.     PMFS History:  Patient Active Problem List   Diagnosis Date Noted   Strep pharyngitis 06/15/2023   Thrush 06/15/2023   Lymphadenopathy 06/15/2023   SIRS (systemic inflammatory response syndrome) (HCC) 05/11/2023   Acute encephalopathy 05/11/2023   Pyelonephritis 05/11/2023   Hypercalcemia 05/11/2023   Hypokalemia 05/11/2023   QT prolongation 05/11/2023   Metabolic acidosis 05/11/2023   Chronic kidney disease, stage 3a (HCC) 05/11/2023   Obesity (BMI 30-39.9) 05/11/2023   Anemia 01/03/2023   Left lumbar radiculopathy 06/18/2022   COVID-19 virus infection 03/31/2022   Chronic low back pain with left-sided sciatica 12/19/2021   Vitamin D  deficiency 04/03/2021   Cough 03/29/2021   Acute upper respiratory infection 02/15/2021   Snoring 02/15/2021   Spinal stenosis of lumbar region 08/05/2019   Inability to walk 08/05/2019   Hives 07/08/2019   History of glaucoma 04/24/2019   Bursitis of left hip 12/25/2018   MGUS (monoclonal gammopathy of unknown significance) 05/11/2018  Rheumatoid arthritis involving multiple sites with positive rheumatoid factor (HCC) 05/03/2018   High risk medication use 05/03/2018   Status post total left knee replacement 05/03/2018   DDD (degenerative disc disease), lumbar 05/03/2018   Primary osteoarthritis of right knee 04/05/2018   Arthritis of left wrist 12/19/2017   Left rotator  cuff tear 12/19/2017   Left wrist pain 12/12/2017   Left shoulder pain 08/22/2017   Acute pain of right knee 04/17/2017   Pain of left calf 04/13/2017   S/P left TK revision 10/09/2016   Hyperglycemia 09/23/2015   Anxiety state 09/23/2015   Abnormal urine odor 08/25/2015   GERD without esophagitis 08/25/2015   Upper abdominal pain 04/16/2015   Early satiety 04/16/2015   Dysuria 04/16/2015   Encounter for well adult exam with abnormal findings 06/18/2014   Greater trochanteric bursitis of right hip 01/06/2013   Left lumbar radiculitis 04/03/2012   Eczema 04/03/2012   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   BURSITIS, RIGHT HIP 08/25/2009   INSOMNIA-SLEEP DISORDER-UNSPEC 08/25/2009   FATIGUE 08/25/2009   Cervicalgia 08/14/2007   Depression 03/15/2007   HLD (hyperlipidemia) 12/18/2006   Essential hypertension 12/18/2006   Allergic rhinitis 12/18/2006   RENAL CALCULUS, HX OF 12/18/2006    Past Medical History:  Diagnosis Date   ALLERGIC RHINITIS 12/18/2006   Qualifier: Diagnosis of  By: Sharlette Dayhoff CMA, [place]     Anxiety    Arthritis    DEPRESSION, SITUATIONAL 03/15/2007   Qualifier: Diagnosis of  By: Marthe Slain    GERD (gastroesophageal reflux disease)    Headache    occasional   History of kidney stones    HYPERLIPIDEMIA 12/18/2006   Qualifier: History of  By: Sharlette Dayhoff CMA, [place]     Hypertension    HYPERTENSION 12/18/2006   Qualifier: Diagnosis of  By: Sharlette Dayhoff CMA, [place]     INSOMNIA-SLEEP DISORDER-UNSPEC 08/25/2009   Qualifier: Diagnosis of  By: Autry Legions MD, Alveda Aures    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) 04/03/2012   Pneumonia    RENAL CALCULUS, HX OF 12/18/2006   Qualifier: Diagnosis of  By: Sharlette Dayhoff CMA, [place]      Family History  Problem Relation Age of Onset   Stroke Mother    Heart disease Father    Lung cancer Sister    Lung cancer Brother    Healthy Daughter    Healthy Daughter    Healthy Daughter    Colon cancer Neg Hx    Esophageal cancer Neg  Hx    Pancreatic cancer Neg Hx    Stomach cancer Neg Hx    Past Surgical History:  Procedure Laterality Date   CERVICAL DISC SURGERY     x 3   CONVERSION TO TOTAL KNEE Left 10/09/2016   Procedure: Conversion left uni compartment arthroplasty to total knee arthroplasty;  Surgeon: Claiborne Crew, MD;  Location: [facility];  Service: Orthopedics;  Laterality: Left;  90 mins   KNEE ARTHROSCOPY     partial   TOOTH EXTRACTION  2024   x2   total left knee arthoplasty     10/09/16 Dr. Bernard Brick   Social History   Social History Narrative   Not on file   Immunization History  Administered Date(s) Administered   Fluad Quad(high Dose 65+) 04/16/2020, 12/24/2020, 12/19/2021   Fluad Trivalent(High Dose 65+) 12/25/2022   Influenza Whole 11/12/2012   Influenza,inj,Quad PF,6+ Mos 11/29/2016, 12/11/2017, 12/25/2018   Influenza-Unspecified 12/15/2013, 12/12/2015, 11/29/2016   PNEUMOCOCCAL CONJUGATE-20 06/15/2022   Td  11/03/2002   Tdap 09/23/2015   Zoster Recombinant(Shingrix ) 03/22/2018, 08/20/2018   Zoster, Live 08/12/2012     Objective: Vital Signs: BP 122/88 (BP Location: Left Arm, Patient Position: Sitting, Cuff Size: Normal)   Pulse (!) 101   Resp 15   Ht 5\' 9"  (1.753 m)   Wt 219 lb 6.4 oz (99.5 kg)   BMI 32.40 kg/m    Physical Exam Vitals and nursing note reviewed.  Constitutional:      Appearance: She is well-developed.  HENT:     Head: Normocephalic and atraumatic.  Eyes:     Conjunctiva/sclera: Conjunctivae normal.  Cardiovascular:     Rate and Rhythm: Normal rate and regular rhythm.     Heart sounds: Normal heart sounds.  Pulmonary:     Effort: Pulmonary effort is normal.     Breath sounds: Normal breath sounds.  Abdominal:     General: Bowel sounds are normal.     Palpations: Abdomen is soft.  Musculoskeletal:     Cervical back: Normal range of motion.  Lymphadenopathy:     Cervical: No cervical adenopathy.  Skin:    General: Skin is warm and dry.     Capillary  Refill: Capillary refill takes less than 2 seconds.  Neurological:     Mental Status: She is alert and oriented to person, place, and time.  Psychiatric:        Behavior: Behavior normal.      Musculoskeletal Exam: C-spine has limited range of motion.  Thoracic and lumbar spine good range of motion.  No midline spinal tenderness.  No SI joint tenderness.  Shoulder joints, elbow joints, wrist joints, MCPs, PIPs, DIPs have good range of motion with no synovitis.  Complete fist formation bilaterally.  Hip joints have good range of motion with no groin pain.  Painful range of motion of both knee joints.  Left knee is replaced.  No warmth or effusion of the right knee noted.  Ankle joints have good range of motion with no tenderness or joint swelling.  CDAI Exam: CDAI Score: -- Patient Global: --; Provider Global: -- Swollen: --; Tender: -- Joint Exam 08/13/2023   No joint exam has been documented for this visit   There is currently no information documented on the homunculus. Go to the Rheumatology activity and complete the homunculus joint exam.  Investigation: No additional findings.  Imaging: No results found.  Recent Labs: Lab Results  Component Value Date   WBC 2.9 (L) 06/28/2023   HGB 11.1 (L) 06/28/2023   PLT 538.0 (H) 06/28/2023   NA 141 06/28/2023   K 3.4 (L) 06/28/2023   CL 103 06/28/2023   CO2 31 06/28/2023   GLUCOSE 92 06/28/2023   BUN 14 06/28/2023   CREATININE 0.82 06/28/2023   BILITOT 0.4 06/28/2023   ALKPHOS 58 06/28/2023   AST 23 06/28/2023   ALT 19 06/28/2023   PROT 7.0 06/28/2023   ALBUMIN 4.0 06/28/2023   CALCIUM  9.6 06/28/2023   GFRAA 74 05/14/2020   QFTBGOLDPLUS NEGATIVE 11/08/2022    Speciality Comments: PLQ Eye Exam: 03/26/2019 WNL @ [facility] PA FOllow up in 1 year  Procedures:  No procedures performed Allergies: Cymbalta  [duloxetine  hcl]      Assessment / Plan:     Visit Diagnoses: Rheumatoid arthritis involving multiple  sites with positive rheumatoid factor (HCC) - ANA 1:160 Cytoplasmic, RF 116, sed rate 22: Patient presents today experiencing increased pain and stiffness involving both knee joints.  She underwent Orthovisc  injections for the right knee in October 2024, which provided significant relief but her symptoms recurred 2 weeks ago.  She has noticed intermittent warmth in both knees but no effusion was noted on examination today.  Patient has been off of Humira  for the past 2 months but has remained on methotrexate  4 tablets by mouth once weekly and folic acid  1 mg daily.  She was unsure if Humira  was providing any benefit so she decided to discontinue on her own.  Discussed that I would recommend resuming Humira  since she has become more symptomatic as well as to help prevent radiographic progression.  A refill of Humira , methotrexate , and folic acid  will be sent to the pharmacy today.  Plan to also reapply for viscosupplementation for the right knee.  Patient also requested a prednisone  taper to be sent to the pharmacy to help to alleviate her current symptoms.  A prednisone  taper starting at 20 mg tapering by 5 mg every 2 days was sent to the pharmacy.  Instructions were provided.  She was advised to notify us  if her symptoms persist or worsen.  High risk medication use - Humira  40 mg sq injections every 14 days-started 12/01/19, Methotrexate  4 tablets by mouth once weekly, and folic acid  1 mg po daily. CBC, BMP, and hepatic function panel on 06/28/23.  Her next lab work will be due in mid July and every 3 months to monitor for drug toxicity. TB gold negative on 11/08/22.  Hgb A1c 5.5% updated on 06/28/23.  Lipid panel WNL on 06/28/23. No recent or recurrent infections.  Discussed the importance of holding humira  and methotrexate  if she develops signs or symptoms of an infection and to resume once the infection has completely cleared.   Discussed the importance of yearly skin cancer screenings while on Humira .   Offered to place a referral to dermatology but the patient would like to have her PCP perform the yearly skin exams.  Primary osteoarthritis of right knee: Patient underwent Orthovisc injections in the right knee in October 2024 which provided significant relief.  Her mobility improved after undergoing Orthovisc injections.  Starting about 2 weeks ago she started to have increased warmth, stiffness, and discomfort in the right knee.  She is been having difficulty ambulating due to severity of symptoms.  She has also had difficulty climbing steps and rising from a seated position.  She would like to reapply for Orthovisc injections for the right knee.  She also requested a prednisone  taper to be sent to the pharmacy today. She will be resuming humira  as discussed above.   S/P left TK revision: Patient presents today with increased discomfort in the left knee x2 weeks.  No recent injury or fall.  Intermittent warmth.  No effusion noted today.    Trochanteric bursitis, right hip: Not currently symptomatic.   Traumatic incomplete tear of left rotator cuff, subsequent encounter: Good ROM with no discomfort currently.   Spondylosis of lumbar spine: Intermittent discomfort.  No midline spinal tenderness at this time.   Other medical conditions are listed as follows:  Essential hypertension: Blood pressure was 122/88 today in the office.    History of hyperlipidemia: Lipid panel within normal limits on 06/28/2023.  History of gastroesophageal reflux (GERD)  History of glaucoma  Vitamin D  deficiency  Anxiety and depression  Other proteinuria  Orders: No orders of the defined types were placed in this encounter.  Meds ordered this encounter  Medications   predniSONE  (DELTASONE ) 5 MG tablet  Sig: Take 4 tabs po x 2 days, 3  tabs po x 2 days, 2  tabs po x 2 days, 1  tab po x 2 days    Dispense:  20 tablet    Refill:  0      Follow-Up Instructions: Return in about 5 months (around  01/13/2024) for Rheumatoid arthritis.   Romayne Clubs, PA-C  Note - This record has been created using Dragon software.  Chart creation errors have been sought, but may not always  have been located. Such creation errors do not reflect on  the standard of medical care.

## 2023-08-13 ENCOUNTER — Ambulatory Visit: Attending: Physician Assistant | Admitting: Physician Assistant

## 2023-08-13 ENCOUNTER — Telehealth: Payer: Self-pay

## 2023-08-13 ENCOUNTER — Other Ambulatory Visit: Payer: Self-pay

## 2023-08-13 ENCOUNTER — Encounter: Payer: Self-pay | Admitting: Physician Assistant

## 2023-08-13 VITALS — BP 122/88 | HR 101 | Resp 15 | Ht 69.0 in | Wt 219.4 lb

## 2023-08-13 DIAGNOSIS — I10 Essential (primary) hypertension: Secondary | ICD-10-CM | POA: Diagnosis not present

## 2023-08-13 DIAGNOSIS — S46012D Strain of muscle(s) and tendon(s) of the rotator cuff of left shoulder, subsequent encounter: Secondary | ICD-10-CM

## 2023-08-13 DIAGNOSIS — M0579 Rheumatoid arthritis with rheumatoid factor of multiple sites without organ or systems involvement: Secondary | ICD-10-CM

## 2023-08-13 DIAGNOSIS — M47816 Spondylosis without myelopathy or radiculopathy, lumbar region: Secondary | ICD-10-CM

## 2023-08-13 DIAGNOSIS — Z8669 Personal history of other diseases of the nervous system and sense organs: Secondary | ICD-10-CM

## 2023-08-13 DIAGNOSIS — Z8639 Personal history of other endocrine, nutritional and metabolic disease: Secondary | ICD-10-CM

## 2023-08-13 DIAGNOSIS — M1711 Unilateral primary osteoarthritis, right knee: Secondary | ICD-10-CM | POA: Diagnosis not present

## 2023-08-13 DIAGNOSIS — E559 Vitamin D deficiency, unspecified: Secondary | ICD-10-CM

## 2023-08-13 DIAGNOSIS — M7061 Trochanteric bursitis, right hip: Secondary | ICD-10-CM

## 2023-08-13 DIAGNOSIS — Z96652 Presence of left artificial knee joint: Secondary | ICD-10-CM | POA: Diagnosis not present

## 2023-08-13 DIAGNOSIS — F32A Depression, unspecified: Secondary | ICD-10-CM

## 2023-08-13 DIAGNOSIS — F419 Anxiety disorder, unspecified: Secondary | ICD-10-CM

## 2023-08-13 DIAGNOSIS — Z8719 Personal history of other diseases of the digestive system: Secondary | ICD-10-CM

## 2023-08-13 DIAGNOSIS — R808 Other proteinuria: Secondary | ICD-10-CM

## 2023-08-13 DIAGNOSIS — Z79899 Other long term (current) drug therapy: Secondary | ICD-10-CM | POA: Diagnosis not present

## 2023-08-13 MED ORDER — FOLIC ACID 1 MG PO TABS: 1.0000 mg | ORAL_TABLET | Freq: Every day | ORAL | 0 refills | Status: DC

## 2023-08-13 MED ORDER — PREDNISONE 5 MG PO TABS: ORAL_TABLET | ORAL | 0 refills | Status: AC

## 2023-08-13 MED ORDER — HUMIRA (2 PEN) 40 MG/0.4ML ~~LOC~~ AJKT: 40.0000 mg | AUTO-INJECTOR | SUBCUTANEOUS | 0 refills | Status: AC

## 2023-08-13 MED ORDER — METHOTREXATE SODIUM 2.5 MG PO TABS: 10.0000 mg | ORAL_TABLET | ORAL | 0 refills | Status: DC

## 2023-08-13 NOTE — Telephone Encounter (Signed)
 Can you please refill Humira  , Methotrexate , and Folic Acid  per patient request at visit today.

## 2023-08-13 NOTE — Telephone Encounter (Signed)
 Please apply for right knee Visco per Jacinta Martinis.

## 2023-08-13 NOTE — Patient Instructions (Signed)
 Standing Labs We placed an order today for your standing lab work.   Please have your standing labs drawn in mid-July and every 3 months   Please have your labs drawn 2 weeks prior to your appointment so that the provider can discuss your lab results at your appointment, if possible.  Please note that you may see your imaging and lab results in MyChart before we have reviewed them. We will contact you once all results are reviewed. Please allow our office up to 72 hours to thoroughly review all of the results before contacting the office for clarification of your results.  WALK-IN LAB HOURS  Monday through Thursday from 8:00 am -12:30 pm and 1:00 pm-4:00 pm and Friday from 8:00 am-12:00 pm.  Patients with office visits requiring labs will be seen before walk-in labs.  You may encounter longer than normal wait times. Please allow additional time. Wait times may be shorter on  Monday and Thursday afternoons.  We do not book appointments for walk-in labs. We appreciate your patience and understanding with our staff.   Labs are drawn by [facility]. Please bring your co-pay at the time of your lab draw.  You may receive a bill from [facility] for your lab work.  Please note if you are on Hydroxychloroquine  and and an order has been placed for a Hydroxychloroquine  level,  you will need to have it drawn 4 hours or more after your last dose.  If you wish to have your labs drawn at another location, please call the office 24 hours in advance so we can fax the orders.  The office is located at [address], [place], [place] [address]   If you have any questions regarding directions or hours of operation,  please call [phone].   As a reminder, please drink plenty of water prior to coming for your lab work. Thanks!

## 2023-08-16 ENCOUNTER — Other Ambulatory Visit: Payer: Self-pay | Admitting: Internal Medicine

## 2023-08-16 NOTE — Telephone Encounter (Signed)
VOB submitted for Synvisc, Right knee(s) BV pending

## 2023-08-17 ENCOUNTER — Encounter: Attending: Physical Medicine & Rehabilitation | Admitting: Registered Nurse

## 2023-08-17 ENCOUNTER — Encounter: Payer: Self-pay | Admitting: Registered Nurse

## 2023-08-17 VITALS — BP 145/86 | HR 81 | Ht 69.0 in | Wt 224.2 lb

## 2023-08-17 DIAGNOSIS — M25562 Pain in left knee: Secondary | ICD-10-CM | POA: Diagnosis not present

## 2023-08-17 DIAGNOSIS — G629 Polyneuropathy, unspecified: Secondary | ICD-10-CM | POA: Insufficient documentation

## 2023-08-17 DIAGNOSIS — G8929 Other chronic pain: Secondary | ICD-10-CM | POA: Insufficient documentation

## 2023-08-17 DIAGNOSIS — Z79891 Long term (current) use of opiate analgesic: Secondary | ICD-10-CM | POA: Insufficient documentation

## 2023-08-17 DIAGNOSIS — G894 Chronic pain syndrome: Secondary | ICD-10-CM | POA: Diagnosis not present

## 2023-08-17 DIAGNOSIS — Z5181 Encounter for therapeutic drug level monitoring: Secondary | ICD-10-CM | POA: Insufficient documentation

## 2023-08-17 DIAGNOSIS — M545 Low back pain, unspecified: Secondary | ICD-10-CM | POA: Insufficient documentation

## 2023-08-17 DIAGNOSIS — M25561 Pain in right knee: Secondary | ICD-10-CM | POA: Diagnosis not present

## 2023-08-17 MED ORDER — OXYCODONE-ACETAMINOPHEN 5-325 MG PO TABS: 1.0000 | ORAL_TABLET | Freq: Four times a day (QID) | ORAL | 0 refills | Status: DC | PRN

## 2023-08-17 NOTE — Progress Notes (Signed)
 Subjective:    Patient ID: Pamela [name], female    DOB: 1955-05-18, 68 y.o.   MRN: [id number]  HPI: Pamela [name] is a 68 y.o. female who returns for follow up appointment for chronic pain and medication refill. She states her pain is located in her lower back, bilateral lower extremities ( achy Pain) and bilateral knee pain. She rates her pain 8. Her current exercise regime is walking and performing stretching exercises.  Mr. Mcswain Morphine  equivalent is 30.00 MME.   Last Oral Swab was Performed on 07/02/2023.    Pain Inventory Average Pain 7 Pain Right Now 8 My pain is dull and stabbing  In the last 24 hours, has pain interfered with the following? General activity 1 Relation with others 1 Enjoyment of life 1 What TIME of day is your pain at its worst? morning , daytime, and evening Sleep (in general) Poor  Pain is worse with: walking, standing, and some activites Pain improves with: medication Relief from Meds: no answer  Family History  Problem Relation Age of Onset   Stroke Mother    Heart disease Father    Lung cancer Sister    Lung cancer Brother    Healthy Daughter    Healthy Daughter    Healthy Daughter    Colon cancer Neg Hx    Esophageal cancer Neg Hx    Pancreatic cancer Neg Hx    Stomach cancer Neg Hx    Social History   Socioeconomic History   Marital status: Married    Spouse name: Not on file   Number of children: 3   Years of education: Not on file   Highest education level: Not on file  Occupational History   Not on file  Tobacco Use   Smoking status: Former    Current packs/day: 0.00    Average packs/day: 0.3 packs/day for 3.0 years (0.8 ttl pk-yrs)    Types: Cigarettes    Start date: 50    Quit date: 1970    Years since quitting: 55.4    Passive exposure: Never   Smokeless tobacco: Never   Tobacco comments:    1970's  Vaping Use   Vaping status: Never Used  Substance and Sexual Activity   Alcohol use: Yes    Comment: occ    Drug use: No   Sexual activity: Yes  Other Topics Concern   Not on file  Social History Narrative   Not on file   Social Drivers of Health   Financial Resource Strain: Low Risk  (08/15/2022)   Overall Financial Resource Strain (CARDIA)    Difficulty of Paying Living Expenses: Not hard at all  Food Insecurity: No Food Insecurity (05/14/2023)   Hunger Vital Sign    Worried About Running Out of Food in the Last Year: Never true    Ran Out of Food in the Last Year: Never true  Transportation Needs: No Transportation Needs (05/14/2023)   PRAPARE - Administrator, Civil Service (Medical): No    Lack of Transportation (Non-Medical): No  Physical Activity: Inactive (08/15/2022)   Exercise Vital Sign    Days of Exercise per Week: 0 days    Minutes of Exercise per Session: 0 min  Stress: No Stress Concern Present (08/15/2022)   [employer] of Occupational Health - Occupational Stress Questionnaire    Feeling of Stress : Not at all  Social Connections: Socially Integrated (05/12/2023)   Social Connection and Isolation Panel [NHANES]  Frequency of Communication with Friends and Family: More than three times a week    Frequency of Social Gatherings with Friends and Family: More than three times a week    Attends Religious Services: More than 4 times per year    Active Member of [employer] or Organizations: Yes    Attends Engineer, structural: More than 4 times per year    Marital Status: Married   Past Surgical History:  Procedure Laterality Date   CERVICAL DISC SURGERY     x 3   CONVERSION TO TOTAL KNEE Left 10/09/2016   Procedure: Conversion left uni compartment arthroplasty to total knee arthroplasty;  Surgeon: Claiborne Crew, MD;  Location: [facility];  Service: Orthopedics;  Laterality: Left;  90 mins   KNEE ARTHROSCOPY     partial   TOOTH EXTRACTION  2024   x2   total left knee arthoplasty     10/09/16 Dr. Bernard Brick   Past Surgical History:  Procedure Laterality Date    CERVICAL DISC SURGERY     x 3   CONVERSION TO TOTAL KNEE Left 10/09/2016   Procedure: Conversion left uni compartment arthroplasty to total knee arthroplasty;  Surgeon: Claiborne Crew, MD;  Location: [facility];  Service: Orthopedics;  Laterality: Left;  90 mins   KNEE ARTHROSCOPY     partial   TOOTH EXTRACTION  2024   x2   total left knee arthoplasty     10/09/16 Dr. Bernard Brick   Past Medical History:  Diagnosis Date   ALLERGIC RHINITIS 12/18/2006   Qualifier: Diagnosis of  By: Sharlette Dayhoff CMA, [place]     Anxiety    Arthritis    DEPRESSION, SITUATIONAL 03/15/2007   Qualifier: Diagnosis of  By: Marthe Slain    GERD (gastroesophageal reflux disease)    Headache    occasional   History of kidney stones    HYPERLIPIDEMIA 12/18/2006   Qualifier: History of  By: Sharlette Dayhoff CMA, [place]     Hypertension    HYPERTENSION 12/18/2006   Qualifier: Diagnosis of  By: Sharlette Dayhoff CMA, [place]     INSOMNIA-SLEEP DISORDER-UNSPEC 08/25/2009   Qualifier: Diagnosis of  By: Autry Legions MD, Alveda Aures    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) 04/03/2012   Pneumonia    RENAL CALCULUS, HX OF 12/18/2006   Qualifier: Diagnosis of  By: Sharlette Dayhoff CMA, [place]     BP (!) 145/86   Pulse 81   Ht 5' 9 (1.753 m)   Wt 224 lb 3.2 oz (101.7 kg)   SpO2 97%   BMI 33.11 kg/m   Opioid Risk Score:   Fall Risk Score:  `1  Depression screen [facility] 2/9     08/17/2023    9:16 AM 07/02/2023   10:41 AM 06/28/2023   10:15 AM 04/03/2023   11:15 AM 02/16/2023   10:01 AM 01/18/2023   10:35 AM 01/03/2023    1:51 PM  Depression screen PHQ 2/9  Decreased Interest 1 1 0 0 3 2 0  Down, Depressed, Hopeless 1 1 0 0 3 2 0  PHQ - 2 Score 2 2 0 0 6 4 0  Altered sleeping      2 0  Tired, decreased energy      2 0  Change in appetite      2 0  Feeling bad or failure about yourself       1 0  Trouble concentrating      1 0  Moving slowly  or fidgety/restless      2 0  Suicidal thoughts      0 0  PHQ-9 Score      14 0  Difficult doing  work/chores       Not difficult at all     Review of Systems  Musculoskeletal:  Positive for arthralgias and back pain.  Psychiatric/Behavioral:  Positive for dysphoric mood.   All other systems reviewed and are negative.      Objective:   Physical Exam Vitals and nursing note reviewed.  Constitutional:      Appearance: Normal appearance.   Cardiovascular:     Rate and Rhythm: Normal rate and regular rhythm.     Pulses: Normal pulses.     Heart sounds: Normal heart sounds.  Pulmonary:     Effort: Pulmonary effort is normal.     Breath sounds: Normal breath sounds.   Musculoskeletal:     Comments: Normal Muscle Bulk and Muscle Testing Reveals:  Upper Extremities: Full ROM and Muscle Strength 5/5 Lumbar Paraspinal Tenderness: L-4- L-5 Lower Extremities: Full ROM and Muscle Strength 5/5 Arises from Table with ease Narrow Based  Gait      Skin:    General: Skin is warm and dry.   Neurological:     Mental Status: She is alert and oriented to person, place, and time.   Psychiatric:        Mood and Affect: Mood normal.        Behavior: Behavior normal.         Assessment & Plan:  Chronic Bilateral Low Back Pain without Sciatica: Continue HEP as tolerated. Continue to Monitor.  Chronic Bilateral Knee Pain: Continue HEP as Tolerated. Continue to Monitor.  Neuropathy: Continue Gabapentin . Continue to Monitor.  Chronic Pain Syndrome: Refilled: Oxycodone  5/325 mg one tablet 4 times a day as needed or pain #120. We will continue the opioid monitoring program, this consists of regular clinic visits, examinations, urine drug screen, pill counts as well as use of [place]  Controlled Substance Reporting system. A 12 month History has been reviewed on the [place]  Controlled Substance Reporting System on 08/17/2023

## 2023-08-20 ENCOUNTER — Ambulatory Visit: Payer: Medicare HMO

## 2023-08-20 NOTE — Telephone Encounter (Signed)
 Please call to schedule visco injections.  Approved for Synvisc, Right knee(s). Buy & Raenette Bumps Once out of pocket is met [phone]) pt is covered $45 co-pay Deductible does not apply No pre-certifications    Case # [id number]

## 2023-08-20 NOTE — Telephone Encounter (Signed)
 LVM to schedule pts injections

## 2023-08-23 ENCOUNTER — Other Ambulatory Visit: Payer: Self-pay

## 2023-09-02 ENCOUNTER — Other Ambulatory Visit: Payer: Self-pay | Admitting: Internal Medicine

## 2023-09-02 ENCOUNTER — Other Ambulatory Visit: Payer: Self-pay | Admitting: Physician Assistant

## 2023-09-13 ENCOUNTER — Encounter: Payer: Self-pay | Admitting: Registered Nurse

## 2023-09-13 ENCOUNTER — Encounter: Attending: Physical Medicine & Rehabilitation | Admitting: Registered Nurse

## 2023-09-13 VITALS — BP 139/86 | HR 106 | Ht 69.0 in | Wt 213.2 lb

## 2023-09-13 DIAGNOSIS — G894 Chronic pain syndrome: Secondary | ICD-10-CM | POA: Diagnosis not present

## 2023-09-13 DIAGNOSIS — Z5181 Encounter for therapeutic drug level monitoring: Secondary | ICD-10-CM | POA: Diagnosis not present

## 2023-09-13 DIAGNOSIS — G8929 Other chronic pain: Secondary | ICD-10-CM | POA: Insufficient documentation

## 2023-09-13 DIAGNOSIS — M545 Low back pain, unspecified: Secondary | ICD-10-CM | POA: Insufficient documentation

## 2023-09-13 DIAGNOSIS — M7061 Trochanteric bursitis, right hip: Secondary | ICD-10-CM | POA: Insufficient documentation

## 2023-09-13 DIAGNOSIS — Z79891 Long term (current) use of opiate analgesic: Secondary | ICD-10-CM | POA: Insufficient documentation

## 2023-09-13 DIAGNOSIS — M2550 Pain in unspecified joint: Secondary | ICD-10-CM | POA: Insufficient documentation

## 2023-09-13 MED ORDER — OXYCODONE-ACETAMINOPHEN 5-325 MG PO TABS: 1.0000 | ORAL_TABLET | Freq: Four times a day (QID) | ORAL | 0 refills | Status: DC | PRN

## 2023-09-13 MED ORDER — OXYCODONE-ACETAMINOPHEN 5-325 MG PO TABS
1.0000 | ORAL_TABLET | Freq: Four times a day (QID) | ORAL | 0 refills | Status: DC | PRN
Start: 2023-09-13 — End: 2023-11-07

## 2023-09-13 NOTE — Progress Notes (Signed)
 Subjective:    Patient ID: Pamela [name], female    DOB: Jul 18, 1955, 68 y.o.   MRN: [id number]  HPI: Pamela [name] is a 68 y.o. female who returns for follow up appointment for chronic pain and medication refill. She states her pain is located in her lower back, right hip and bilateral lower extremity she reports achy pain.  She rates her pain 9. Her current exercise regime is walking and performing stretching exercises.  Ms. Belue Morphine  equivalent is 30.00 MME.   Last Oral Swab was Performed on 07/02/2023, it was consistent.     Pain Inventory Average Pain 10 Pain Right Now 9 My pain is aching  In the last 24 hours, has pain interfered with the following? General activity 4 Relation with others 3 Enjoyment of life 2 What TIME of day is your pain at its worst? morning , daytime, and evening Sleep (in general) Poor  Pain is worse with: walking, standing, and some activites Pain improves with: medication Relief from Meds: na  Family History  Problem Relation Age of Onset   Stroke Mother    Heart disease Father    Lung cancer Sister    Lung cancer Brother    Healthy Daughter    Healthy Daughter    Healthy Daughter    Colon cancer Neg Hx    Esophageal cancer Neg Hx    Pancreatic cancer Neg Hx    Stomach cancer Neg Hx    Social History   Socioeconomic History   Marital status: Married    Spouse name: Not on file   Number of children: 3   Years of education: Not on file   Highest education level: Not on file  Occupational History   Not on file  Tobacco Use   Smoking status: Former    Current packs/day: 0.00    Average packs/day: 0.3 packs/day for 3.0 years (0.8 ttl pk-yrs)    Types: Cigarettes    Start date: 19    Quit date: 1970    Years since quitting: 55.5    Passive exposure: Never   Smokeless tobacco: Never   Tobacco comments:    1970's  Vaping Use   Vaping status: Never Used  Substance and Sexual Activity   Alcohol use: Yes    Comment: occ    Drug use: No   Sexual activity: Yes  Other Topics Concern   Not on file  Social History Narrative   Not on file   Social Drivers of Health   Financial Resource Strain: Low Risk  (08/15/2022)   Overall Financial Resource Strain (CARDIA)    Difficulty of Paying Living Expenses: Not hard at all  Food Insecurity: No Food Insecurity (05/14/2023)   Hunger Vital Sign    Worried About Running Out of Food in the Last Year: Never true    Ran Out of Food in the Last Year: Never true  Transportation Needs: No Transportation Needs (05/14/2023)   PRAPARE - Administrator, Civil Service (Medical): No    Lack of Transportation (Non-Medical): No  Physical Activity: Inactive (08/15/2022)   Exercise Vital Sign    Days of Exercise per Week: 0 days    Minutes of Exercise per Session: 0 min  Stress: No Stress Concern Present (08/15/2022)   [employer] of Occupational Health - Occupational Stress Questionnaire    Feeling of Stress : Not at all  Social Connections: Socially Integrated (05/12/2023)   Social Connection and Isolation Panel  Frequency of Communication with Friends and Family: More than three times a week    Frequency of Social Gatherings with Friends and Family: More than three times a week    Attends Religious Services: More than 4 times per year    Active Member of [employer] or Organizations: Yes    Attends Engineer, structural: More than 4 times per year    Marital Status: Married   Past Surgical History:  Procedure Laterality Date   CERVICAL DISC SURGERY     x 3   CONVERSION TO TOTAL KNEE Left 10/09/2016   Procedure: Conversion left uni compartment arthroplasty to total knee arthroplasty;  Surgeon: Ernie Cough, MD;  Location: [facility];  Service: Orthopedics;  Laterality: Left;  90 mins   KNEE ARTHROSCOPY     partial   TOOTH EXTRACTION  2024   x2   total left knee arthoplasty     10/09/16 Dr. ERNIE   Past Surgical History:  Procedure Laterality Date   CERVICAL  DISC SURGERY     x 3   CONVERSION TO TOTAL KNEE Left 10/09/2016   Procedure: Conversion left uni compartment arthroplasty to total knee arthroplasty;  Surgeon: Ernie Cough, MD;  Location: [facility];  Service: Orthopedics;  Laterality: Left;  90 mins   KNEE ARTHROSCOPY     partial   TOOTH EXTRACTION  2024   x2   total left knee arthoplasty     10/09/16 Dr. ERNIE   Past Medical History:  Diagnosis Date   ALLERGIC RHINITIS 12/18/2006   Qualifier: Diagnosis of  By: Nickola CMA, [place]     Anxiety    Arthritis    DEPRESSION, SITUATIONAL 03/15/2007   Qualifier: Diagnosis of  By: Georgian ROSALEA CHARM Lamar    GERD (gastroesophageal reflux disease)    Headache    occasional   History of kidney stones    HYPERLIPIDEMIA 12/18/2006   Qualifier: History of  By: Nickola CMA, [place]     Hypertension    HYPERTENSION 12/18/2006   Qualifier: Diagnosis of  By: Nickola CMA, [place]     INSOMNIA-SLEEP DISORDER-UNSPEC 08/25/2009   Qualifier: Diagnosis of  By: Norleen MD, [facility] [facility]    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) 04/03/2012   Pneumonia    RENAL CALCULUS, HX OF 12/18/2006   Qualifier: Diagnosis of  By: Nickola CMA, [place]     BP 139/86   Pulse (!) 108   Ht 5' 9 (1.753 m)   Wt 213 lb 3.2 oz (96.7 kg)   SpO2 96%   BMI 31.48 kg/m   Opioid Risk Score:   Fall Risk Score:  `1  Depression screen [facility] 2/9     09/13/2023    9:47 AM 08/17/2023    9:16 AM 07/02/2023   10:41 AM 06/28/2023   10:15 AM 04/03/2023   11:15 AM 02/16/2023   10:01 AM 01/18/2023   10:35 AM  Depression screen PHQ 2/9  Decreased Interest  1 1 0 0 3 2  Down, Depressed, Hopeless  1 1 0 0 3 2  PHQ - 2 Score  2 2 0 0 6 4  Altered sleeping 1      2  Tired, decreased energy 1      2  Change in appetite       2  Feeling bad or failure about yourself        1  Trouble concentrating       1  Moving slowly  or fidgety/restless       2  Suicidal thoughts       0  PHQ-9 Score       14     Review of Systems   Musculoskeletal:  Positive for back pain.       Bil legs  All other systems reviewed and are negative.      Objective:   Physical Exam Vitals and nursing note reviewed.  Constitutional:      Appearance: Normal appearance.  Cardiovascular:     Rate and Rhythm: Tachycardia present.  Pulmonary:     Effort: Pulmonary effort is normal.     Breath sounds: Normal breath sounds.  Musculoskeletal:     Comments: Normal Muscle Bulk and Muscle Testing Reveals:  Upper Extremities: Full ROM and Muscle Strength 5/5  Lumbar Paraspinal Tenderness: L-4-L-5 Right Greater Trochanter Tenderness Lower Extremities: Full ROM and Muscle Strength 5/5 Arises from Chair slowly Narrow Based Gait     Skin:    General: Skin is warm and dry.  Neurological:     Mental Status: She is alert and oriented to person, place, and time.  Psychiatric:        Mood and Affect: Mood normal.        Behavior: Behavior normal.          Assessment & Plan:  Chronic Bilateral Low Back Pain without Sciatica: Continue HEP as tolerated. Continue to Monitor. 09/13/2023 Chronic Bilateral Knee Pain: No complaints today. Continue HEP as Tolerated. Continue to Monitor. 09/13/2023 Neuropathy: Continue Gabapentin . Continue to Monitor. 09/13/2023 Chronic Pain Syndrome: Refilled: Oxycodone  5/325 mg one tablet 4 times a day as needed or pain #120. We will continue the opioid monitoring program, this consists of regular clinic visits, examinations, urine drug screen, pill counts as well as use of [place]  Controlled Substance Reporting system. A 12 month History has been reviewed on the [place]  Controlled Substance Reporting System on 09/13/2023  F/U in 1 month

## 2023-09-19 ENCOUNTER — Ambulatory Visit: Attending: Physician Assistant | Admitting: Physician Assistant

## 2023-09-19 DIAGNOSIS — M1711 Unilateral primary osteoarthritis, right knee: Secondary | ICD-10-CM | POA: Diagnosis not present

## 2023-09-19 MED ORDER — HYLAN G-F 20 16 MG/2ML IX SOSY
16.0000 mg | PREFILLED_SYRINGE | INTRA_ARTICULAR | Status: AC | PRN
  Administered 2023-09-19: 16 mg via INTRA_ARTICULAR

## 2023-09-19 MED ORDER — LIDOCAINE HCL 1 % IJ SOLN
1.5000 mL | INTRAMUSCULAR | Status: AC | PRN
  Administered 2023-09-19: 1.5 mL

## 2023-09-19 NOTE — Progress Notes (Signed)
   Procedure Note  Patient: Pamela [name]             Date of Birth: Dec 13, 1955           MRN: [id number]             Visit Date: 09/19/2023  Procedures: Visit Diagnoses:   #1 Synvisc Right knee B/B 1. Primary osteoarthritis of right knee     Large Joint Inj: R knee on 09/19/2023 9:49 AM Indications: pain Details: 25 G 1.5 in needle, medial approach  Arthrogram: No  Medications: 1.5 mL lidocaine  1 %; 16 mg hylan 16 MG/2ML Aspirate: 0 mL Outcome: tolerated well, no immediate complications Procedure, treatment alternatives, risks and benefits explained, specific risks discussed. Consent was given by the patient.    Patient tolerated the procedure well.  Aftercare was discussed.  Waddell Craze, PA-C

## 2023-09-26 ENCOUNTER — Ambulatory Visit: Attending: Physician Assistant | Admitting: Physician Assistant

## 2023-09-26 DIAGNOSIS — M1711 Unilateral primary osteoarthritis, right knee: Secondary | ICD-10-CM

## 2023-09-26 MED ORDER — LIDOCAINE HCL 1 % IJ SOLN
1.5000 mL | INTRAMUSCULAR | Status: AC | PRN
  Administered 2023-09-26: 1.5 mL

## 2023-09-26 MED ORDER — HYLAN G-F 20 16 MG/2ML IX SOSY
16.0000 mg | PREFILLED_SYRINGE | INTRA_ARTICULAR | Status: AC | PRN
  Administered 2023-09-26: 16 mg via INTRA_ARTICULAR

## 2023-09-26 NOTE — Progress Notes (Signed)
   Procedure Note  Patient: Pamela [name]             Date of Birth: 05-25-1955           MRN: [id number]             Visit Date: 09/26/2023  Procedures: Visit Diagnoses:  1. Primary osteoarthritis of right knee    Synvisc #2 Right knee only  Large Joint Inj: R knee on 09/26/2023 10:09 AM Indications: pain Details: 25 G 1.5 in needle, medial approach  Arthrogram: No  Medications: 1.5 mL lidocaine  1 %; 16 mg hylan 16 MG/2ML Aspirate: 0 mL Outcome: tolerated well, no immediate complications Procedure, treatment alternatives, risks and benefits explained, specific risks discussed. Consent was given by the patient. Immediately prior to procedure a time out was called to verify the correct patient, procedure, equipment, support staff and site/side marked as required. Patient was prepped and draped in the usual sterile fashion.    Patient tolerated the procedure well.  Aftercare was discussed.  Waddell Craze, PA-C

## 2023-09-30 ENCOUNTER — Other Ambulatory Visit: Payer: Self-pay | Admitting: Physician Assistant

## 2023-09-30 ENCOUNTER — Other Ambulatory Visit: Payer: Self-pay | Admitting: Internal Medicine

## 2023-10-03 ENCOUNTER — Ambulatory Visit: Admitting: Physician Assistant

## 2023-10-08 ENCOUNTER — Other Ambulatory Visit: Payer: Self-pay | Admitting: Physician Assistant

## 2023-10-10 ENCOUNTER — Ambulatory Visit: Admitting: Physician Assistant

## 2023-10-16 ENCOUNTER — Ambulatory Visit: Admitting: Physician Assistant

## 2023-10-17 ENCOUNTER — Ambulatory Visit: Attending: Physician Assistant | Admitting: Physician Assistant

## 2023-10-17 DIAGNOSIS — M1711 Unilateral primary osteoarthritis, right knee: Secondary | ICD-10-CM

## 2023-10-17 MED ORDER — LIDOCAINE HCL 1 % IJ SOLN
1.5000 mL | INTRAMUSCULAR | Status: AC | PRN
Start: 2023-10-17 — End: 2023-10-17
  Administered 2023-10-17: 1.5 mL

## 2023-10-17 MED ORDER — HYLAN G-F 20 16 MG/2ML IX SOSY
16.0000 mg | PREFILLED_SYRINGE | INTRA_ARTICULAR | Status: AC | PRN
  Administered 2023-10-17: 16 mg via INTRA_ARTICULAR

## 2023-10-17 NOTE — Progress Notes (Signed)
   Procedure Note  Patient: Pamela [name]             Date of Birth: 07-06-1955           MRN: [id number]             Visit Date: 10/17/2023  Procedures:  #3 Synvisc Right Knee B/B Visit Diagnoses:  1. Primary osteoarthritis of right knee     Large Joint Inj: R knee on 10/17/2023 1:24 PM Indications: pain Details: 25 G 1.5 in needle, medial approach  Arthrogram: No  Medications: 1.5 mL lidocaine  1 %; 16 mg hylan 16 MG/2ML Aspirate: 0 mL Outcome: tolerated well, no immediate complications Procedure, treatment alternatives, risks and benefits explained, specific risks discussed. Consent was given by the patient.     Patient tolerated the procedure well.  Procedure was completed above.  Aftercare was discussed. Waddell Craze, PA-C

## 2023-10-21 ENCOUNTER — Other Ambulatory Visit: Payer: Self-pay | Admitting: Physician Assistant

## 2023-10-21 ENCOUNTER — Other Ambulatory Visit: Payer: Self-pay | Admitting: Internal Medicine

## 2023-11-03 ENCOUNTER — Other Ambulatory Visit: Payer: Self-pay | Admitting: Physician Assistant

## 2023-11-05 ENCOUNTER — Ambulatory Visit: Admitting: Internal Medicine

## 2023-11-05 NOTE — Telephone Encounter (Signed)
 Last Fill: 08/13/2023  Labs: 06/28/2023 WBC 2.9 RBC 3.84 Hemoglobin 11.1 HCT 32.9 Platelets 538.0 Neutrophils 40.7 Monocytes Relative 20.8 Eosinophils 5.4 Neutro abs 1.2 Potassium 3.4   Next Visit: 01/14/2024  Last Visit: 08/13/2023  DX: Rheumatoid arthritis involving multiple sites with positive rheumatoid factor   Current Dose per office note 08/13/2023: Methotrexate  4 tablets by mouth once weekly,   Okay to refill Methotrexate ?   LMOM for patient to update labs, provided lab hours.

## 2023-11-07 ENCOUNTER — Other Ambulatory Visit: Payer: Self-pay | Admitting: Internal Medicine

## 2023-11-07 ENCOUNTER — Encounter: Attending: Physical Medicine & Rehabilitation | Admitting: Registered Nurse

## 2023-11-07 VITALS — BP 139/80 | HR 73 | Ht 69.0 in | Wt 217.0 lb

## 2023-11-07 DIAGNOSIS — M545 Low back pain, unspecified: Secondary | ICD-10-CM | POA: Insufficient documentation

## 2023-11-07 DIAGNOSIS — M48061 Spinal stenosis, lumbar region without neurogenic claudication: Secondary | ICD-10-CM | POA: Insufficient documentation

## 2023-11-07 DIAGNOSIS — Y92009 Unspecified place in unspecified non-institutional (private) residence as the place of occurrence of the external cause: Secondary | ICD-10-CM | POA: Diagnosis not present

## 2023-11-07 DIAGNOSIS — M25551 Pain in right hip: Secondary | ICD-10-CM | POA: Insufficient documentation

## 2023-11-07 DIAGNOSIS — Z5181 Encounter for therapeutic drug level monitoring: Secondary | ICD-10-CM | POA: Insufficient documentation

## 2023-11-07 DIAGNOSIS — W19XXXA Unspecified fall, initial encounter: Secondary | ICD-10-CM | POA: Insufficient documentation

## 2023-11-07 DIAGNOSIS — G894 Chronic pain syndrome: Secondary | ICD-10-CM | POA: Insufficient documentation

## 2023-11-07 DIAGNOSIS — G8929 Other chronic pain: Secondary | ICD-10-CM

## 2023-11-07 DIAGNOSIS — Z79891 Long term (current) use of opiate analgesic: Secondary | ICD-10-CM | POA: Insufficient documentation

## 2023-11-07 MED ORDER — OXYCODONE-ACETAMINOPHEN 7.5-325 MG PO TABS: 1.0000 | ORAL_TABLET | Freq: Four times a day (QID) | ORAL | 0 refills | Status: DC | PRN

## 2023-11-07 MED ORDER — GABAPENTIN 800 MG PO TABS: 800.0000 mg | ORAL_TABLET | Freq: Three times a day (TID) | ORAL | 3 refills | Status: AC

## 2023-11-07 NOTE — Patient Instructions (Signed)
 Call office in 3 weeks with update on medication change.

## 2023-11-07 NOTE — Progress Notes (Signed)
 Subjective:    Patient ID: Pamela [name], female    DOB: 1955/08/01, 68 y.o.   MRN: [id number]  HPI: Pamela [name] is a 68 y.o. female who returns for follow up appointment for chronic pain and medication refill. She states her pain is located in her lower back, right hip and bilateral lower extremities she reports achy . She rates her pain 7. Her current exercise regime is walking and performing stretching exercises.  [name] reports she had a fall this morning, she was returning from the bathroom and thought she was close to her bed, and missed the bed and fell and landed on her right hip. She picked herself up, she didn't seek medical attention. She reports she didn't have a walker at the time of the fall. She was educated on falls prevention, she was instructed to use her walker at all times. She verbalizes understanding.   [name] Morphine  equivalent is 30.00 MME.   Oral Swab was Performed today.      Pain Inventory Average Pain 7 Pain Right Now 7 My pain is sharp, aching, and spasms  In the last 24 hours, has pain interfered with the following? General activity 6 Relation with others 0 Enjoyment of life 6 What TIME of day is your pain at its worst? varies Sleep (in general) Poor  Pain is worse with: walking and standing Pain improves with: medication Relief from Meds: 5  Family History  Problem Relation Age of Onset   Stroke Mother    Heart disease Father    Lung cancer Sister    Lung cancer Brother    Healthy Daughter    Healthy Daughter    Healthy Daughter    Colon cancer Neg Hx    Esophageal cancer Neg Hx    Pancreatic cancer Neg Hx    Stomach cancer Neg Hx    Social History   Socioeconomic History   Marital status: Married    Spouse name: Not on file   Number of children: 3   Years of education: Not on file   Highest education level: Not on file  Occupational History   Not on file  Tobacco Use   Smoking status: Former    Current packs/day: 0.00     Average packs/day: 0.3 packs/day for 3.0 years (0.8 ttl pk-yrs)    Types: Cigarettes    Start date: 40    Quit date: 1970    Years since quitting: 55.6    Passive exposure: Never   Smokeless tobacco: Never   Tobacco comments:    1970's  Vaping Use   Vaping status: Never Used  Substance and Sexual Activity   Alcohol use: Yes    Comment: occ   Drug use: No   Sexual activity: Yes  Other Topics Concern   Not on file  Social History Narrative   Not on file   Social Drivers of Health   Financial Resource Strain: Low Risk  (08/15/2022)   Overall Financial Resource Strain (CARDIA)    Difficulty of Paying Living Expenses: Not hard at all  Food Insecurity: No Food Insecurity (05/14/2023)   Hunger Vital Sign    Worried About Running Out of Food in the Last Year: Never true    Ran Out of Food in the Last Year: Never true  Transportation Needs: No Transportation Needs (05/14/2023)   PRAPARE - Administrator, Civil Service (Medical): No    Lack of Transportation (Non-Medical): No  Physical  Activity: Inactive (08/15/2022)   Exercise Vital Sign    Days of Exercise per Week: 0 days    Minutes of Exercise per Session: 0 min  Stress: No Stress Concern Present (08/15/2022)   [employer] of Occupational Health - Occupational Stress Questionnaire    Feeling of Stress : Not at all  Social Connections: Socially Integrated (05/12/2023)   Social Connection and Isolation Panel    Frequency of Communication with Friends and Family: More than three times a week    Frequency of Social Gatherings with Friends and Family: More than three times a week    Attends Religious Services: More than 4 times per year    Active Member of [employer] or Organizations: Yes    Attends Engineer, structural: More than 4 times per year    Marital Status: Married   Past Surgical History:  Procedure Laterality Date   CERVICAL DISC SURGERY     x 3   CONVERSION TO TOTAL KNEE Left 10/09/2016    Procedure: Conversion left uni compartment arthroplasty to total knee arthroplasty;  Surgeon: Ernie Cough, MD;  Location: [facility];  Service: Orthopedics;  Laterality: Left;  90 mins   KNEE ARTHROSCOPY     partial   TOOTH EXTRACTION  2024   x2   total left knee arthoplasty     10/09/16 Dr. ERNIE   Past Surgical History:  Procedure Laterality Date   CERVICAL DISC SURGERY     x 3   CONVERSION TO TOTAL KNEE Left 10/09/2016   Procedure: Conversion left uni compartment arthroplasty to total knee arthroplasty;  Surgeon: Ernie Cough, MD;  Location: [facility];  Service: Orthopedics;  Laterality: Left;  90 mins   KNEE ARTHROSCOPY     partial   TOOTH EXTRACTION  2024   x2   total left knee arthoplasty     10/09/16 Dr. ERNIE   Past Medical History:  Diagnosis Date   ALLERGIC RHINITIS 12/18/2006   Qualifier: Diagnosis of  By: Nickola CMA, [place]     Anxiety    Arthritis    DEPRESSION, SITUATIONAL 03/15/2007   Qualifier: Diagnosis of  By: Georgian ROSALEA CHARM Lamar    GERD (gastroesophageal reflux disease)    Headache    occasional   History of kidney stones    HYPERLIPIDEMIA 12/18/2006   Qualifier: History of  By: Nickola CMA, [place]     Hypertension    HYPERTENSION 12/18/2006   Qualifier: Diagnosis of  By: Nickola CMA, [place]     INSOMNIA-SLEEP DISORDER-UNSPEC 08/25/2009   Qualifier: Diagnosis of  By: Norleen MD, [place] [facility]    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) 04/03/2012   Pneumonia    RENAL CALCULUS, HX OF 12/18/2006   Qualifier: Diagnosis of  By: Nickola CMA, [place]     BP 139/80   Pulse 73   Ht 5' 9 (1.753 m)   Wt 217 lb (98.4 kg)   SpO2 96%   BMI 32.05 kg/m   Opioid Risk Score:   Fall Risk Score:  `1  Depression screen [facility] 2/9     11/07/2023   11:07 AM 09/13/2023    9:47 AM 08/17/2023    9:16 AM 07/02/2023   10:41 AM 06/28/2023   10:15 AM 04/03/2023   11:15 AM 02/16/2023   10:01 AM  Depression screen PHQ 2/9  Decreased Interest 1  1 1  0 0 3  Down, Depressed,  Hopeless 1  1 1  0 0 3  PHQ -  2 Score 2  2 2  0 0 6  Altered sleeping  1       Tired, decreased energy  1          Review of Systems  Musculoskeletal:        Bilateral leg pain  All other systems reviewed and are negative.      Objective:   Physical Exam Vitals and nursing note reviewed.  Constitutional:      Appearance: Normal appearance.  Cardiovascular:     Rate and Rhythm: Normal rate and regular rhythm.     Pulses: Normal pulses.     Heart sounds: Normal heart sounds.  Pulmonary:     Effort: Pulmonary effort is normal.     Breath sounds: Normal breath sounds.  Musculoskeletal:     Comments: Normal Muscle Bulk and Muscle Testing Reveals:  Upper Extremities: Decreased ROM 90 Degrees and Muscle Strength 5/5 Lumbar Paraspinal Tenderness: L-3-L-5 Right Greater Trochanter Tenderness Lower Extremities: Right: Decreased ROM and Muscle Strength 5/5 Right Lower Extremity Flexion Produces Pain into her Right Patella Left Lower extremity: Full ROM and Muscle Strength 5/5 Arises from Table slowly Antalgic  Gait     Skin:    General: Skin is warm and dry.  Neurological:     Mental Status: She is alert and oriented to person, place, and time.  Psychiatric:        Mood and Affect: Mood normal.        Behavior: Behavior normal.          Assessment & Plan:  Chronic Bilateral Low Back Pain without Sciatica: Continue HEP as tolerated. Continue to Monitor. 11/07/2023 Chronic Bilateral Knee Pain: No complaints today. Continue HEP as Tolerated. Continue to Monitor. 11/07/2023 Neuropathy: Continue Gabapentin . Continue to Monitor. 11/07/2023 Acute Right Hip Pain: RX: Xray. Continue to Monitor.  Fall at Home: Educated on [employer], she was instructed to use her walker at all times. She verbalizes understanding.  Chronic Pain Syndrome: Refilled: Oxycodone  5/325 mg one tablet 4 times a day as needed or pain #120. We will continue the opioid monitoring program, this consists of  regular clinic visits, examinations, urine drug screen, pill counts as well as use of [place]  Controlled Substance Reporting system. A 12 month History has been reviewed on the [place]  Controlled Substance Reporting System on 11/07/2023  F/U in 1 month

## 2023-11-08 ENCOUNTER — Ambulatory Visit: Admitting: Internal Medicine

## 2023-11-11 LAB — DRUG TOX MONITOR 1 W/CONF, ORAL FLD

## 2023-11-11 LAB — DRUG TOX ALC METAB W/CON, ORAL FLD: Alcohol Metabolite: NEGATIVE ng/mL (ref <25)

## 2023-11-13 ENCOUNTER — Other Ambulatory Visit: Payer: Self-pay | Admitting: Physician Assistant

## 2023-11-13 NOTE — Telephone Encounter (Signed)
 Last Fill: 08/13/2023  Next Visit: 01/14/2024  Last Visit: 08/13/2023  Dx: Rheumatoid arthritis involving multiple sites with positive rheumatoid factor (HCC)   Current Dose per office note on 08/13/2023: folic acid  1 mg po daily   Okay to refill Folic Acid ?

## 2023-11-14 ENCOUNTER — Encounter: Payer: Self-pay | Admitting: Registered Nurse

## 2023-11-21 ENCOUNTER — Other Ambulatory Visit: Payer: Self-pay | Admitting: Internal Medicine

## 2023-11-21 ENCOUNTER — Other Ambulatory Visit: Payer: Self-pay | Admitting: Physician Assistant

## 2023-11-30 ENCOUNTER — Other Ambulatory Visit: Payer: Self-pay | Admitting: Internal Medicine

## 2023-11-30 ENCOUNTER — Other Ambulatory Visit: Payer: Self-pay | Admitting: Physician Assistant

## 2023-12-04 ENCOUNTER — Other Ambulatory Visit: Payer: Self-pay | Admitting: Internal Medicine

## 2023-12-06 ENCOUNTER — Other Ambulatory Visit: Payer: Self-pay | Admitting: Physician Assistant

## 2023-12-10 ENCOUNTER — Telehealth: Payer: Self-pay | Admitting: Physician Assistant

## 2023-12-10 MED ORDER — PANTOPRAZOLE SODIUM 40 MG PO TBEC: 40.0000 mg | DELAYED_RELEASE_TABLET | Freq: Two times a day (BID) | ORAL | 0 refills | Status: AC

## 2023-12-10 NOTE — Telephone Encounter (Signed)
 Received a call from patient stating they are in need of pantoprazole  refill to [address] [facility]. Please review and advise  Thank you

## 2023-12-10 NOTE — Telephone Encounter (Signed)
 Pantoprazole  40 mg - 30 day supply sent to patient's pharmacy to get her until her scheduled appointment.

## 2023-12-10 NOTE — Telephone Encounter (Signed)
 Received a call from patient stating she is in need for a pantoprazole  40 mg refill, please review and advise   Thank you

## 2023-12-10 NOTE — Telephone Encounter (Signed)
 Refill sent. See telephone from 12/10/23

## 2023-12-13 ENCOUNTER — Telehealth: Payer: Self-pay | Admitting: Registered Nurse

## 2023-12-13 ENCOUNTER — Other Ambulatory Visit: Payer: Self-pay | Admitting: Registered Nurse

## 2023-12-13 DIAGNOSIS — G8929 Other chronic pain: Secondary | ICD-10-CM

## 2023-12-13 DIAGNOSIS — G894 Chronic pain syndrome: Secondary | ICD-10-CM

## 2023-12-13 MED ORDER — OXYCODONE-ACETAMINOPHEN 7.5-325 MG PO TABS: 1.0000 | ORAL_TABLET | Freq: Four times a day (QID) | ORAL | 0 refills | Status: DC | PRN

## 2023-12-13 NOTE — Telephone Encounter (Signed)
Patient needs a refill on oxycodone.

## 2023-12-13 NOTE — Telephone Encounter (Signed)
 PMP was reviewed.  Oxycodone  e-scribed to pharmacy.  Call placed to Ms. Rajagopalan, no answer, left message to call office.

## 2023-12-17 ENCOUNTER — Other Ambulatory Visit: Payer: Self-pay | Admitting: Internal Medicine

## 2023-12-19 ENCOUNTER — Other Ambulatory Visit: Payer: Self-pay | Admitting: Physician Assistant

## 2023-12-19 ENCOUNTER — Other Ambulatory Visit: Payer: Self-pay | Admitting: Internal Medicine

## 2023-12-31 ENCOUNTER — Ambulatory Visit: Admitting: Gastroenterology

## 2023-12-31 NOTE — Progress Notes (Deleted)
 Office Visit Note  Patient: Pamela [name]             Date of Birth: 31-Mar-1955           MRN: [id number]             PCP: Norleen Lynwood ORN, MD Referring: Norleen Lynwood ORN, MD Visit Date: 01/14/2024 Occupation: Data Unavailable  Subjective:  No chief complaint on file.   History of Present Illness: Pamela [name] is a 68 y.o. female ***     Activities of Daily Living:  Patient reports morning stiffness for *** {minute/hour:19697}.   Patient {ACTIONS;DENIES/REPORTS:21021675::Denies} nocturnal pain.  Difficulty dressing/grooming: {ACTIONS;DENIES/REPORTS:21021675::Denies} Difficulty climbing stairs: {ACTIONS;DENIES/REPORTS:21021675::Denies} Difficulty getting out of chair: {ACTIONS;DENIES/REPORTS:21021675::Denies} Difficulty using hands for taps, buttons, cutlery, and/or writing: {ACTIONS;DENIES/REPORTS:21021675::Denies}  No Rheumatology ROS completed.   PMFS History:  Patient Active Problem List   Diagnosis Date Noted   Strep pharyngitis 06/15/2023   Thrush 06/15/2023   Lymphadenopathy 06/15/2023   SIRS (systemic inflammatory response syndrome) (HCC) 05/11/2023   Acute encephalopathy 05/11/2023   Pyelonephritis 05/11/2023   Hypercalcemia 05/11/2023   Hypokalemia 05/11/2023   QT prolongation 05/11/2023   Metabolic acidosis 05/11/2023   Chronic kidney disease, stage 3a (HCC) 05/11/2023   Obesity (BMI 30-39.9) 05/11/2023   Anemia 01/03/2023   Left lumbar radiculopathy 06/18/2022   COVID-19 virus infection 03/31/2022   Chronic low back pain with left-sided sciatica 12/19/2021   Vitamin D  deficiency 04/03/2021   Cough 03/29/2021   Acute upper respiratory infection 02/15/2021   Snoring 02/15/2021   Spinal stenosis of lumbar region 08/05/2019   Inability to walk 08/05/2019   Hives 07/08/2019   History of glaucoma 04/24/2019   Bursitis of left hip 12/25/2018   MGUS (monoclonal gammopathy of unknown significance) 05/11/2018   Rheumatoid arthritis involving multiple  sites with positive rheumatoid factor (HCC) 05/03/2018   High risk medication use 05/03/2018   Status post total left knee replacement 05/03/2018   DDD (degenerative disc disease), lumbar 05/03/2018   Primary osteoarthritis of right knee 04/05/2018   Arthritis of left wrist 12/19/2017   Left rotator cuff tear 12/19/2017   Left wrist pain 12/12/2017   Left shoulder pain 08/22/2017   Acute pain of right knee 04/17/2017   Pain of left calf 04/13/2017   S/P left TK revision 10/09/2016   Hyperglycemia 09/23/2015   Anxiety state 09/23/2015   Abnormal urine odor 08/25/2015   GERD without esophagitis 08/25/2015   Upper abdominal pain 04/16/2015   Early satiety 04/16/2015   Dysuria 04/16/2015   Encounter for well adult exam with abnormal findings 06/18/2014   Greater trochanteric bursitis of right hip 01/06/2013   Left lumbar radiculitis 04/03/2012   Eczema 04/03/2012   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   BURSITIS, RIGHT HIP 08/25/2009   INSOMNIA-SLEEP DISORDER-UNSPEC 08/25/2009   FATIGUE 08/25/2009   Cervicalgia 08/14/2007   Depression 03/15/2007   HLD (hyperlipidemia) 12/18/2006   Essential hypertension 12/18/2006   Allergic rhinitis 12/18/2006   RENAL CALCULUS, HX OF 12/18/2006    Past Medical History:  Diagnosis Date   ALLERGIC RHINITIS 12/18/2006   Qualifier: Diagnosis of  By: Nickola CMA, [place]     Anxiety    Arthritis    DEPRESSION, SITUATIONAL 03/15/2007   Qualifier: Diagnosis of  By: Georgian ROSALEA CHARM Lamar    GERD (gastroesophageal reflux disease)    Headache    occasional   History of kidney stones    HYPERLIPIDEMIA 12/18/2006   Qualifier: History of  By: Nickola CMA, [place]  Hypertension    HYPERTENSION 12/18/2006   Qualifier: Diagnosis of  By: Nickola CMA, [place]     INSOMNIA-SLEEP DISORDER-UNSPEC 08/25/2009   Qualifier: Diagnosis of  By: Norleen MD, Lynwood ORN    Pain in knee region after total knee replacement 04/03/2012   Pneumonia    RENAL CALCULUS, HX OF 12/18/2006    Qualifier: Diagnosis of  By: Nickola CMA, [place]      Family History  Problem Relation Age of Onset   Stroke Mother    Heart disease Father    Lung cancer Sister    Lung cancer Brother    Healthy Daughter    Healthy Daughter    Healthy Daughter    Colon cancer Neg Hx    Esophageal cancer Neg Hx    Pancreatic cancer Neg Hx    Stomach cancer Neg Hx    Past Surgical History:  Procedure Laterality Date   CERVICAL DISC SURGERY     x 3   CONVERSION TO TOTAL KNEE Left 10/09/2016   Procedure: Conversion left uni compartment arthroplasty to total knee arthroplasty;  Surgeon: Ernie Cough, MD;  Location: [facility];  Service: Orthopedics;  Laterality: Left;  90 mins   KNEE ARTHROSCOPY     partial   TOOTH EXTRACTION  2024   x2   total left knee arthoplasty     10/09/16 Dr. ERNIE   Social History   Tobacco Use   Smoking status: Former    Current packs/day: 0.00    Average packs/day: 0.3 packs/day for 3.0 years (0.8 ttl pk-yrs)    Types: Cigarettes    Start date: 99    Quit date: 1970    Years since quitting: 55.8    Passive exposure: Never   Smokeless tobacco: Never   Tobacco comments:    1970's  Vaping Use   Vaping status: Never Used  Substance Use Topics   Alcohol use: Yes    Comment: occ   Drug use: No   Social History   Social History Narrative   Not on file     Immunization History  Administered Date(s) Administered   Fluad Quad(high Dose 65+) 04/16/2020, 12/24/2020, 12/19/2021   Fluad Trivalent(High Dose 65+) 12/25/2022   Influenza Whole 11/12/2012   Influenza,inj,Quad PF,6+ Mos 11/29/2016, 12/11/2017, 12/25/2018   Influenza-Unspecified 12/15/2013, 12/12/2015, 11/29/2016   PNEUMOCOCCAL CONJUGATE-20 06/15/2022   Td 11/03/2002   Tdap 09/23/2015   Zoster Recombinant(Shingrix ) 03/22/2018, 08/20/2018   Zoster, Live 08/12/2012     Objective: Vital Signs: There were no vitals taken for this visit.   Physical Exam   Musculoskeletal Exam: ***  CDAI  Exam: CDAI Score: -- Patient Global: --; Provider Global: -- Swollen: --; Tender: -- Joint Exam 01/14/2024   No joint exam has been documented for this visit   There is currently no information documented on the homunculus. Go to the Rheumatology activity and complete the homunculus joint exam.  Investigation: No additional findings.  Imaging: No results found.  Recent Labs: Lab Results  Component Value Date   WBC 2.9 (L) 06/28/2023   HGB 11.1 (L) 06/28/2023   PLT 538.0 (H) 06/28/2023   NA 141 06/28/2023   K 3.4 (L) 06/28/2023   CL 103 06/28/2023   CO2 31 06/28/2023   GLUCOSE 92 06/28/2023   BUN 14 06/28/2023   CREATININE 0.82 06/28/2023   BILITOT 0.4 06/28/2023   ALKPHOS 58 06/28/2023   AST 23 06/28/2023   ALT 19 06/28/2023   PROT 7.0 06/28/2023   ALBUMIN  4.0 06/28/2023   CALCIUM  9.6 06/28/2023   GFRAA 74 05/14/2020   QFTBGOLDPLUS NEGATIVE 11/08/2022    Speciality Comments: PLQ Eye Exam: 03/26/2019 [facility] FOllow up in 1 year  Procedures:  No procedures performed Allergies: Cymbalta  [duloxetine  hcl]   Assessment / Plan:     Visit Diagnoses: Rheumatoid arthritis involving multiple sites with positive rheumatoid factor (HCC)  High risk medication use  Primary osteoarthritis of right knee  S/P left TK revision  Trochanteric bursitis, right hip  Traumatic incomplete tear of left rotator cuff, subsequent encounter  Spondylosis of lumbar spine  Essential hypertension  History of hyperlipidemia  History of gastroesophageal reflux (GERD)  History of glaucoma  Vitamin D  deficiency  Anxiety and depression  Other proteinuria  Orders: No orders of the defined types were placed in this encounter.  No orders of the defined types were placed in this encounter.   Face-to-face time spent with patient was *** minutes. Greater than 50% of time was spent in counseling and coordination of care.  Follow-Up Instructions: No follow-ups  on file.   Waddell CHRISTELLA Craze, PA-C  Note - This record has been created using Dragon software.  Chart creation errors have been sought, but may not always  have been located. Such creation errors do not reflect on  the standard of medical care.

## 2024-01-02 ENCOUNTER — Other Ambulatory Visit: Payer: Self-pay | Admitting: Physician Assistant

## 2024-01-04 ENCOUNTER — Other Ambulatory Visit: Payer: Self-pay | Admitting: Physician Assistant

## 2024-01-04 ENCOUNTER — Other Ambulatory Visit: Payer: Self-pay | Admitting: Internal Medicine

## 2024-01-09 ENCOUNTER — Encounter: Payer: Self-pay | Admitting: Registered Nurse

## 2024-01-09 ENCOUNTER — Encounter: Attending: Physical Medicine & Rehabilitation | Admitting: Registered Nurse

## 2024-01-09 VITALS — BP 124/77 | HR 90 | Ht 69.0 in | Wt 214.8 lb

## 2024-01-09 DIAGNOSIS — M545 Low back pain, unspecified: Secondary | ICD-10-CM | POA: Diagnosis not present

## 2024-01-09 DIAGNOSIS — G894 Chronic pain syndrome: Secondary | ICD-10-CM | POA: Diagnosis not present

## 2024-01-09 DIAGNOSIS — Z5181 Encounter for therapeutic drug level monitoring: Secondary | ICD-10-CM | POA: Diagnosis not present

## 2024-01-09 DIAGNOSIS — M25562 Pain in left knee: Secondary | ICD-10-CM | POA: Insufficient documentation

## 2024-01-09 DIAGNOSIS — Z79891 Long term (current) use of opiate analgesic: Secondary | ICD-10-CM | POA: Insufficient documentation

## 2024-01-09 DIAGNOSIS — M25561 Pain in right knee: Secondary | ICD-10-CM | POA: Insufficient documentation

## 2024-01-09 DIAGNOSIS — G8929 Other chronic pain: Secondary | ICD-10-CM | POA: Diagnosis not present

## 2024-01-09 DIAGNOSIS — M2550 Pain in unspecified joint: Secondary | ICD-10-CM | POA: Diagnosis not present

## 2024-01-09 MED ORDER — OXYCODONE-ACETAMINOPHEN 7.5-325 MG PO TABS: 1.0000 | ORAL_TABLET | Freq: Four times a day (QID) | ORAL | 0 refills | Status: DC | PRN

## 2024-01-09 MED ORDER — OXYCODONE-ACETAMINOPHEN 7.5-325 MG PO TABS
1.0000 | ORAL_TABLET | Freq: Four times a day (QID) | ORAL | 0 refills | Status: DC | PRN
Start: 2024-01-09 — End: 2024-01-09

## 2024-01-09 NOTE — Progress Notes (Signed)
 Subjective:    Patient ID: Pamela [name], female    DOB: 03/29/1955, 68 y.o.   MRN: [id number]  HPI: Pamela [name] is a 68 y.o. female who returns for follow up appointment for chronic pain and medication refill. He states his pain is located in his lower back occasionally, bilateral lower extremities ( achy pain) and bilateral knee pain, She rates her pain 8. Her current exercise regime is walking and performing stretching exercises.  Ms. Pree Morphine  equivalent is 45.00 MME. She is also prescribed Alprazolam   by Corean Ku .We have discussed the black box warning of using opioids and benzodiazepines. I highlighted the dangers of using these drugs together and discussed the adverse events including respiratory suppression, overdose, cognitive impairment and importance of compliance with current regimen. We will continue to monitor and adjust as indicated.   She is being closely monitored and under the care of her psychiatrist.   Last Oral Swab was Performed on 11/07/2023, it was consistent, it was consistent.     Pain Inventory Average Pain 8 Pain Right Now 8 My pain is sharp, burning, stabbing, and aching  In the last 24 hours, has pain interfered with the following? General activity 6 Relation with others 0 Enjoyment of life 6 What TIME of day is your pain at its worst? varies Sleep (in general) Poor  Pain is worse with: walking, standing, and some activites Pain improves with: rest and medication Relief from Meds: 9  Family History  Problem Relation Age of Onset   Stroke Mother    Heart disease Father    Lung cancer Sister    Lung cancer Brother    Healthy Daughter    Healthy Daughter    Healthy Daughter    Colon cancer Neg Hx    Esophageal cancer Neg Hx    Pancreatic cancer Neg Hx    Stomach cancer Neg Hx    Social History   Socioeconomic History   Marital status: Married    Spouse name: Not on file   Number of children: 3   Years of education: Not  on file   Highest education level: Not on file  Occupational History   Not on file  Tobacco Use   Smoking status: Former    Current packs/day: 0.00    Average packs/day: 0.3 packs/day for 3.0 years (0.8 ttl pk-yrs)    Types: Cigarettes    Start date: 68    Quit date: 1970    Years since quitting: 55.8    Passive exposure: Never   Smokeless tobacco: Never   Tobacco comments:    1970's  Vaping Use   Vaping status: Never Used  Substance and Sexual Activity   Alcohol use: Yes    Comment: occ   Drug use: No   Sexual activity: Yes  Other Topics Concern   Not on file  Social History Narrative   Not on file   Social Drivers of Health   Financial Resource Strain: Low Risk  (08/15/2022)   Overall Financial Resource Strain (CARDIA)    Difficulty of Paying Living Expenses: Not hard at all  Food Insecurity: No Food Insecurity (05/14/2023)   Hunger Vital Sign    Worried About Running Out of Food in the Last Year: Never true    Ran Out of Food in the Last Year: Never true  Transportation Needs: No Transportation Needs (05/14/2023)   PRAPARE - Transportation    Lack of Transportation (Medical): No    Lack  of Transportation (Non-Medical): No  Physical Activity: Inactive (08/15/2022)   Exercise Vital Sign    Days of Exercise per Week: 0 days    Minutes of Exercise per Session: 0 min  Stress: No Stress Concern Present (08/15/2022)   [employer] of Occupational Health - Occupational Stress Questionnaire    Feeling of Stress : Not at all  Social Connections: Socially Integrated (05/12/2023)   Social Connection and Isolation Panel    Frequency of Communication with Friends and Family: More than three times a week    Frequency of Social Gatherings with Friends and Family: More than three times a week    Attends Religious Services: More than 4 times per year    Active Member of [employer] or Organizations: Yes    Attends Engineer, Structural: More than 4 times per year    Marital  Status: Married   Past Surgical History:  Procedure Laterality Date   CERVICAL DISC SURGERY     x 3   CONVERSION TO TOTAL KNEE Left 10/09/2016   Procedure: Conversion left uni compartment arthroplasty to total knee arthroplasty;  Surgeon: Ernie Cough, MD;  Location: [facility];  Service: Orthopedics;  Laterality: Left;  90 mins   KNEE ARTHROSCOPY     partial   TOOTH EXTRACTION  2024   x2   total left knee arthoplasty     10/09/16 Dr. ERNIE   Past Surgical History:  Procedure Laterality Date   CERVICAL DISC SURGERY     x 3   CONVERSION TO TOTAL KNEE Left 10/09/2016   Procedure: Conversion left uni compartment arthroplasty to total knee arthroplasty;  Surgeon: Ernie Cough, MD;  Location: [facility];  Service: Orthopedics;  Laterality: Left;  90 mins   KNEE ARTHROSCOPY     partial   TOOTH EXTRACTION  2024   x2   total left knee arthoplasty     10/09/16 Dr. ERNIE   Past Medical History:  Diagnosis Date   ALLERGIC RHINITIS 12/18/2006   Qualifier: Diagnosis of  By: Nickola CMA, [place]     Anxiety    Arthritis    DEPRESSION, SITUATIONAL 03/15/2007   Qualifier: Diagnosis of  By: Georgian ROSALEA CHARM Lamar    GERD (gastroesophageal reflux disease)    Headache    occasional   History of kidney stones    HYPERLIPIDEMIA 12/18/2006   Qualifier: History of  By: Nickola CMA, [place]     Hypertension    HYPERTENSION 12/18/2006   Qualifier: Diagnosis of  By: Nickola CMA, [place]     INSOMNIA-SLEEP DISORDER-UNSPEC 08/25/2009   Qualifier: Diagnosis of  By: Norleen MD, Lynwood ORN    Pain in knee region after total knee replacement 04/03/2012   Pneumonia    RENAL CALCULUS, HX OF 12/18/2006   Qualifier: Diagnosis of  By: Nickola CMA, [place]     BP 124/77   Pulse 90   Ht 5' 9 (1.753 m)   Wt 214 lb 12.8 oz (97.4 kg)   SpO2 97%   BMI 31.72 kg/m   Opioid Risk Score:   Fall Risk Score:  `1  Depression screen [facility] 2/9     01/09/2024   11:03 AM 11/07/2023   11:07 AM 09/13/2023    9:47 AM 08/17/2023     9:16 AM 07/02/2023   10:41 AM 06/28/2023   10:15 AM 04/03/2023   11:15 AM  Depression screen PHQ 2/9  Decreased Interest 1 1  1 1  0 0  Down, Depressed, Hopeless 1 1  1 1 0 0  PHQ - 2 Score 2 2  2 2  0 0  Altered sleeping   1      Tired, decreased energy   1         Review of Systems  Musculoskeletal:  Positive for back pain.  All other systems reviewed and are negative.      Objective:   Physical Exam Vitals and nursing note reviewed.  Constitutional:      Appearance: Normal appearance.  Cardiovascular:     Rate and Rhythm: Normal rate and regular rhythm.     Pulses: Normal pulses.     Heart sounds: Normal heart sounds.  Pulmonary:     Effort: Pulmonary effort is normal.     Breath sounds: Normal breath sounds.  Musculoskeletal:     Comments: Normal Muscle Bulk and Muscle Testing Reveals:  Upper Extremities: Full ROM and Muscle Strength 5/5  Lumbar Paraspinal Tenderness: L-4-L-5 Lower Extremities: Full ROM and Muscle Strength 5/5 Arises from Table slowly  Narrow Based Gait     Skin:    General: Skin is warm and dry.  Neurological:     Mental Status: She is alert and oriented to person, place, and time.  Psychiatric:        Mood and Affect: Mood normal.        Behavior: Behavior normal.          Assessment & Plan:  Chronic Bilateral Low Back Pain without Sciatica: Continue HEP as tolerated. Continue to Monitor. 01/09/2024 Chronic Bilateral Knee Pain: Continue HEP as Tolerated. Continue to Monitor. 01/09/2024 Neuropathy: Continue Gabapentin . Continue to Monitor. 01/09/2024 4. Fall at Home: No Falls this month. Educated on [employer], she was instructed to use her walker at all times. She verbalizes understanding. 01/09/2024 5. Chronic Pain Syndrome: Refilled: Oxycodone  7.5/325 mg one tablet 4 times a day as needed or pain #120. We will continue the opioid monitoring program, this consists of regular clinic visits, examinations, urine drug screen, pill counts as  well as use of [place]  Controlled Substance Reporting system. A 12 month History has been reviewed on the [employer] [place]  Controlled Substance Reporting System on 01/09/2024   F/U in 1 month

## 2024-01-10 ENCOUNTER — Other Ambulatory Visit: Payer: Self-pay | Admitting: Physician Assistant

## 2024-01-10 ENCOUNTER — Other Ambulatory Visit: Payer: Self-pay | Admitting: Internal Medicine

## 2024-01-13 LAB — DRUG TOX MONITOR 1 W/CONF, ORAL FLD
Amphetamines: NEGATIVE ng/mL (ref <10)
Barbiturates: NEGATIVE ng/mL (ref <10)
Benzodiazepines: NEGATIVE ng/mL (ref <0.50)
Buprenorphine: NEGATIVE ng/mL (ref <0.10)
Cocaine: NEGATIVE ng/mL (ref <5.0)
Codeine: NEGATIVE ng/mL (ref <2.5)
Dihydrocodeine: NEGATIVE ng/mL (ref <2.5)
Fentanyl: NEGATIVE ng/mL (ref <0.10)
Heroin Metabolite: NEGATIVE ng/mL (ref <1.0)
Hydrocodone: NEGATIVE ng/mL (ref <2.5)
Hydromorphone: NEGATIVE ng/mL (ref <2.5)
MARIJUANA: NEGATIVE ng/mL (ref <2.5)
MDMA: NEGATIVE ng/mL (ref <10)
Meprobamate: NEGATIVE ng/mL (ref <2.5)
Methadone: NEGATIVE ng/mL (ref <5.0)
Morphine: NEGATIVE ng/mL (ref <2.5)
Nicotine Metabolite: NEGATIVE ng/mL (ref <5.0)
Norhydrocodone: NEGATIVE ng/mL (ref <2.5)
Noroxycodone: 10.5 ng/mL — ABNORMAL HIGH (ref <2.5)
Opiates: POSITIVE ng/mL — AB (ref <2.5)
Oxycodone: 8.8 ng/mL — ABNORMAL HIGH (ref <2.5)
Oxymorphone: NEGATIVE ng/mL (ref <2.5)
Phencyclidine: NEGATIVE ng/mL (ref <10)
Tapentadol: NEGATIVE ng/mL (ref <5.0)
Tramadol: NEGATIVE ng/mL (ref <5.0)
Zolpidem: NEGATIVE ng/mL (ref <5.0)

## 2024-01-13 LAB — DRUG TOX ALC METAB W/CON, ORAL FLD: Alcohol Metabolite: NEGATIVE ng/mL (ref <25)

## 2024-01-14 ENCOUNTER — Ambulatory Visit: Admitting: Physician Assistant

## 2024-01-14 DIAGNOSIS — I10 Essential (primary) hypertension: Secondary | ICD-10-CM

## 2024-01-14 DIAGNOSIS — Z8719 Personal history of other diseases of the digestive system: Secondary | ICD-10-CM

## 2024-01-14 DIAGNOSIS — S46012D Strain of muscle(s) and tendon(s) of the rotator cuff of left shoulder, subsequent encounter: Secondary | ICD-10-CM

## 2024-01-14 DIAGNOSIS — M7061 Trochanteric bursitis, right hip: Secondary | ICD-10-CM

## 2024-01-14 DIAGNOSIS — M1711 Unilateral primary osteoarthritis, right knee: Secondary | ICD-10-CM

## 2024-01-14 DIAGNOSIS — Z96652 Presence of left artificial knee joint: Secondary | ICD-10-CM

## 2024-01-14 DIAGNOSIS — M47816 Spondylosis without myelopathy or radiculopathy, lumbar region: Secondary | ICD-10-CM

## 2024-01-14 DIAGNOSIS — E559 Vitamin D deficiency, unspecified: Secondary | ICD-10-CM

## 2024-01-14 DIAGNOSIS — R808 Other proteinuria: Secondary | ICD-10-CM

## 2024-01-14 DIAGNOSIS — M0579 Rheumatoid arthritis with rheumatoid factor of multiple sites without organ or systems involvement: Secondary | ICD-10-CM

## 2024-01-14 DIAGNOSIS — F419 Anxiety disorder, unspecified: Secondary | ICD-10-CM

## 2024-01-14 DIAGNOSIS — Z79899 Other long term (current) drug therapy: Secondary | ICD-10-CM

## 2024-01-14 DIAGNOSIS — Z8669 Personal history of other diseases of the nervous system and sense organs: Secondary | ICD-10-CM

## 2024-01-14 DIAGNOSIS — Z8639 Personal history of other endocrine, nutritional and metabolic disease: Secondary | ICD-10-CM

## 2024-01-23 NOTE — Progress Notes (Deleted)
 Office Visit Note  Patient: Pamela [name]             Date of Birth: [birth year]           MRN: [id number]             PCP: Norleen Lynwood ORN, MD Referring: Norleen Lynwood ORN, MD Visit Date: 02/06/2024 Occupation: Data Unavailable  Subjective:    History of Present Illness: Pamela [name] is a 68 y.o. female with history of seropositive rheumatoid arthritis. Patient remains on  - Humira  40 mg sq injections every 14 days-started 12/01/19, Methotrexate  4 tablets by mouth once weekly, and folic acid  1 mg po daily.   CBC, BMP, and hepatic function panel updated on 06/28/23.  Lipid panel updated on 06/28/23.  TB gold negative on 11/08/22. Orders for TB gold released today.  Discussed the importance of holding humira  and methotrexate  if she develops signs or symptoms of an infection and to resume once the infection has completely cleared.     Activities of Daily Living:  Patient reports morning stiffness for *** {minute/hour:19697}.   Patient {ACTIONS;DENIES/REPORTS:21021675::Denies} nocturnal pain.  Difficulty dressing/grooming: {ACTIONS;DENIES/REPORTS:21021675::Denies} Difficulty climbing stairs: {ACTIONS;DENIES/REPORTS:21021675::Denies} Difficulty getting out of chair: {ACTIONS;DENIES/REPORTS:21021675::Denies} Difficulty using hands for taps, buttons, cutlery, and/or writing: {ACTIONS;DENIES/REPORTS:21021675::Denies}  No Rheumatology ROS completed.   PMFS History:  Patient Active Problem List   Diagnosis Date Noted   Strep pharyngitis 06/15/2023   Thrush 06/15/2023   Lymphadenopathy 06/15/2023   SIRS (systemic inflammatory response syndrome) (HCC) 05/11/2023   Acute encephalopathy 05/11/2023   Pyelonephritis 05/11/2023   Hypercalcemia 05/11/2023   Hypokalemia 05/11/2023   QT prolongation 05/11/2023   Metabolic acidosis 05/11/2023   Chronic kidney disease, stage 3a (HCC) 05/11/2023   Obesity (BMI 30-39.9) 05/11/2023   Anemia 01/03/2023   Left lumbar radiculopathy 06/18/2022    COVID-19 virus infection 03/31/2022   Chronic low back pain with left-sided sciatica 12/19/2021   Vitamin D  deficiency 04/03/2021   Cough 03/29/2021   Acute upper respiratory infection 02/15/2021   Snoring 02/15/2021   Spinal stenosis of lumbar region 08/05/2019   Inability to walk 08/05/2019   Hives 07/08/2019   History of glaucoma 04/24/2019   Bursitis of left hip 12/25/2018   MGUS (monoclonal gammopathy of unknown significance) 05/11/2018   Rheumatoid arthritis involving multiple sites with positive rheumatoid factor (HCC) 05/03/2018   High risk medication use 05/03/2018   Status post total left knee replacement 05/03/2018   DDD (degenerative disc disease), lumbar 05/03/2018   Primary osteoarthritis of right knee 04/05/2018   Arthritis of left wrist 12/19/2017   Left rotator cuff tear 12/19/2017   Left wrist pain 12/12/2017   Left shoulder pain 08/22/2017   Acute pain of right knee 04/17/2017   Pain of left calf 04/13/2017   S/P left TK revision 10/09/2016   Hyperglycemia 09/23/2015   Anxiety state 09/23/2015   Abnormal urine odor 08/25/2015   GERD without esophagitis 08/25/2015   Upper abdominal pain 04/16/2015   Early satiety 04/16/2015   Dysuria 04/16/2015   Encounter for well adult exam with abnormal findings 06/18/2014   Greater trochanteric bursitis of right hip 01/06/2013   Left lumbar radiculitis 04/03/2012   Eczema 04/03/2012   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   BURSITIS, RIGHT HIP 08/25/2009   INSOMNIA-SLEEP DISORDER-UNSPEC 08/25/2009   FATIGUE 08/25/2009   Cervicalgia 08/14/2007   Depression 03/15/2007   HLD (hyperlipidemia) 12/18/2006   Essential hypertension 12/18/2006   Allergic rhinitis 12/18/2006   RENAL CALCULUS, HX OF 12/18/2006  Past Medical History:  Diagnosis Date   ALLERGIC RHINITIS 12/18/2006   Qualifier: Diagnosis of  By: Nickola CMA, [place]     Anxiety    Arthritis    DEPRESSION, SITUATIONAL 03/15/2007   Qualifier: Diagnosis of  By: Georgian ROSALEA CHARM Lamar    GERD (gastroesophageal reflux disease)    Headache    occasional   History of kidney stones    HYPERLIPIDEMIA 12/18/2006   Qualifier: History of  By: Nickola CMA, [place]     Hypertension    HYPERTENSION 12/18/2006   Qualifier: Diagnosis of  By: Nickola CMA, [place]     INSOMNIA-SLEEP DISORDER-UNSPEC 08/25/2009   Qualifier: Diagnosis of  By: Norleen MD, Lynwood ORN    Pain in knee region after total knee replacement 04/03/2012   Pneumonia    RENAL CALCULUS, HX OF 12/18/2006   Qualifier: Diagnosis of  By: Nickola CMA, [place]      Family History  Problem Relation Age of Onset   Stroke Mother    Heart disease Father    Lung cancer Sister    Lung cancer Brother    Healthy Daughter    Healthy Daughter    Healthy Daughter    Colon cancer Neg Hx    Esophageal cancer Neg Hx    Pancreatic cancer Neg Hx    Stomach cancer Neg Hx    Past Surgical History:  Procedure Laterality Date   CERVICAL DISC SURGERY     x 3   CONVERSION TO TOTAL KNEE Left 10/09/2016   Procedure: Conversion left uni compartment arthroplasty to total knee arthroplasty;  Surgeon: Ernie Cough, MD;  Location: [facility];  Service: Orthopedics;  Laterality: Left;  90 mins   KNEE ARTHROSCOPY     partial   TOOTH EXTRACTION  2024   x2   total left knee arthoplasty     10/09/16 Dr. ERNIE   Social History   Tobacco Use   Smoking status: Former    Current packs/day: 0.00    Average packs/day: 0.3 packs/day for 3.0 years (0.8 ttl pk-yrs)    Types: Cigarettes    Start date: 21    Quit date: 1970    Years since quitting: 55.9    Passive exposure: Never   Smokeless tobacco: Never   Tobacco comments:    1970's  Vaping Use   Vaping status: Never Used  Substance Use Topics   Alcohol use: Yes    Comment: occ   Drug use: No   Social History   Social History Narrative   Not on file     Immunization History  Administered Date(s) Administered   Fluad Quad(high Dose 65+) 04/16/2020, 12/24/2020, 12/19/2021    Fluad Trivalent(High Dose 65+) 12/25/2022   Influenza Whole 11/12/2012   Influenza,inj,Quad PF,6+ Mos 11/29/2016, 12/11/2017, 12/25/2018   Influenza-Unspecified 12/15/2013, 12/12/2015, 11/29/2016   PNEUMOCOCCAL CONJUGATE-20 06/15/2022   Td 11/03/2002   Tdap 09/23/2015   Zoster Recombinant(Shingrix ) 03/22/2018, 08/20/2018   Zoster, Live 08/12/2012     Objective: Vital Signs: There were no vitals taken for this visit.   Physical Exam Vitals and nursing note reviewed.  Constitutional:      Appearance: She is well-developed.  HENT:     Head: Normocephalic and atraumatic.  Eyes:     Conjunctiva/sclera: Conjunctivae normal.  Cardiovascular:     Rate and Rhythm: Normal rate and regular rhythm.     Heart sounds: Normal heart sounds.  Pulmonary:     Effort: Pulmonary effort is normal.  Breath sounds: Normal breath sounds.  Abdominal:     General: Bowel sounds are normal.     Palpations: Abdomen is soft.  Musculoskeletal:     Cervical back: Normal range of motion.  Lymphadenopathy:     Cervical: No cervical adenopathy.  Skin:    General: Skin is warm and dry.     Capillary Refill: Capillary refill takes less than 2 seconds.  Neurological:     Mental Status: She is alert and oriented to person, place, and time.  Psychiatric:        Behavior: Behavior normal.      Musculoskeletal Exam: ***  CDAI Exam: CDAI Score: -- Patient Global: --; Provider Global: -- Swollen: --; Tender: -- Joint Exam 02/06/2024   No joint exam has been documented for this visit   There is currently no information documented on the homunculus. Go to the Rheumatology activity and complete the homunculus joint exam.  Investigation: No additional findings.  Imaging: No results found.  Recent Labs: Lab Results  Component Value Date   WBC 2.9 (L) 06/28/2023   HGB 11.1 (L) 06/28/2023   PLT 538.0 (H) 06/28/2023   NA 141 06/28/2023   K 3.4 (L) 06/28/2023   CL 103 06/28/2023   CO2 31  06/28/2023   GLUCOSE 92 06/28/2023   BUN 14 06/28/2023   CREATININE 0.82 06/28/2023   BILITOT 0.4 06/28/2023   ALKPHOS 58 06/28/2023   AST 23 06/28/2023   ALT 19 06/28/2023   PROT 7.0 06/28/2023   ALBUMIN 4.0 06/28/2023   CALCIUM  9.6 06/28/2023   GFRAA 74 05/14/2020   QFTBGOLDPLUS NEGATIVE 11/08/2022    Speciality Comments: PLQ Eye Exam: 03/26/2019 [facility] FOllow up in 1 year  Procedures:  No procedures performed Allergies: Cymbalta  [duloxetine  hcl]   Assessment / Plan:     Visit Diagnoses: Rheumatoid arthritis involving multiple sites with positive rheumatoid factor (HCC)  High risk medication use  S/P left TK revision  Primary osteoarthritis of right knee  Trochanteric bursitis, right hip  Traumatic incomplete tear of left rotator cuff, subsequent encounter  Spondylosis of lumbar spine  Essential hypertension  History of hyperlipidemia  History of gastroesophageal reflux (GERD)  History of glaucoma  Vitamin D  deficiency  Anxiety and depression  Other proteinuria  Orders: No orders of the defined types were placed in this encounter.  No orders of the defined types were placed in this encounter.   Face-to-face time spent with patient was *** minutes. Greater than 50% of time was spent in counseling and coordination of care.  Follow-Up Instructions: No follow-ups on file.   Waddell CHRISTELLA Craze, PA-C  Note - This record has been created using Dragon software.  Chart creation errors have been sought, but may not always  have been located. Such creation errors do not reflect on  the standard of medical care.

## 2024-01-24 ENCOUNTER — Other Ambulatory Visit: Payer: Self-pay

## 2024-01-29 ENCOUNTER — Other Ambulatory Visit: Payer: Self-pay | Admitting: Family Medicine

## 2024-02-06 ENCOUNTER — Ambulatory Visit: Attending: Physician Assistant | Admitting: Physician Assistant

## 2024-02-06 DIAGNOSIS — R808 Other proteinuria: Secondary | ICD-10-CM

## 2024-02-06 DIAGNOSIS — I10 Essential (primary) hypertension: Secondary | ICD-10-CM

## 2024-02-06 DIAGNOSIS — Z79899 Other long term (current) drug therapy: Secondary | ICD-10-CM

## 2024-02-06 DIAGNOSIS — M1711 Unilateral primary osteoarthritis, right knee: Secondary | ICD-10-CM

## 2024-02-06 DIAGNOSIS — M0579 Rheumatoid arthritis with rheumatoid factor of multiple sites without organ or systems involvement: Secondary | ICD-10-CM

## 2024-02-06 DIAGNOSIS — M7061 Trochanteric bursitis, right hip: Secondary | ICD-10-CM

## 2024-02-06 DIAGNOSIS — Z8719 Personal history of other diseases of the digestive system: Secondary | ICD-10-CM

## 2024-02-06 DIAGNOSIS — E559 Vitamin D deficiency, unspecified: Secondary | ICD-10-CM

## 2024-02-06 DIAGNOSIS — Z96652 Presence of left artificial knee joint: Secondary | ICD-10-CM

## 2024-02-06 DIAGNOSIS — M47816 Spondylosis without myelopathy or radiculopathy, lumbar region: Secondary | ICD-10-CM

## 2024-02-06 DIAGNOSIS — Z8639 Personal history of other endocrine, nutritional and metabolic disease: Secondary | ICD-10-CM

## 2024-02-06 DIAGNOSIS — Z8669 Personal history of other diseases of the nervous system and sense organs: Secondary | ICD-10-CM

## 2024-02-06 DIAGNOSIS — F32A Depression, unspecified: Secondary | ICD-10-CM

## 2024-02-06 DIAGNOSIS — S46012D Strain of muscle(s) and tendon(s) of the rotator cuff of left shoulder, subsequent encounter: Secondary | ICD-10-CM

## 2024-03-10 ENCOUNTER — Encounter: Attending: Physical Medicine & Rehabilitation | Admitting: Registered Nurse

## 2024-03-10 ENCOUNTER — Encounter: Payer: Self-pay | Admitting: Registered Nurse

## 2024-03-10 VITALS — BP 109/73 | HR 93 | Ht 69.0 in

## 2024-03-10 DIAGNOSIS — G8929 Other chronic pain: Secondary | ICD-10-CM | POA: Diagnosis present

## 2024-03-10 DIAGNOSIS — M25561 Pain in right knee: Secondary | ICD-10-CM | POA: Diagnosis present

## 2024-03-10 DIAGNOSIS — Z5181 Encounter for therapeutic drug level monitoring: Secondary | ICD-10-CM | POA: Diagnosis present

## 2024-03-10 DIAGNOSIS — Z79891 Long term (current) use of opiate analgesic: Secondary | ICD-10-CM | POA: Diagnosis present

## 2024-03-10 DIAGNOSIS — M25562 Pain in left knee: Secondary | ICD-10-CM | POA: Insufficient documentation

## 2024-03-10 DIAGNOSIS — G894 Chronic pain syndrome: Secondary | ICD-10-CM | POA: Diagnosis present

## 2024-03-10 DIAGNOSIS — M545 Low back pain, unspecified: Secondary | ICD-10-CM | POA: Diagnosis present

## 2024-03-10 MED ORDER — OXYCODONE-ACETAMINOPHEN 7.5-325 MG PO TABS: 1.0000 | ORAL_TABLET | Freq: Four times a day (QID) | ORAL | 0 refills | Status: AC | PRN

## 2024-03-10 MED ORDER — OXYCODONE-ACETAMINOPHEN 7.5-325 MG PO TABS: 1.0000 | ORAL_TABLET | Freq: Four times a day (QID) | ORAL | 0 refills | Status: DC | PRN

## 2024-03-10 NOTE — Progress Notes (Signed)
 "  Subjective:    Patient ID: Pamela [name], female    DOB: [birth year], 68 y.o.   MRN: [id number]  HPI: Pamela [name] is a 68 y.o. female who returns for follow up appointment for chronic pain and medication refill. She states her pain is located in her lower back and bilateral knee pain. She rates her pain 6.Her  current exercise regime is walking and performing stretching exercises.  Pamela [name] Morphine  equivalent is 45.00  MME.   Last Oral Swab was Performed on 01/09/2024, it was consistent.      Pain Inventory Average Pain 7 Pain Right Now 6 My pain is dull and tingling  In the last 24 hours, has pain interfered with the following? General activity 0 Relation with others 2 Enjoyment of life 4 What TIME of day is your pain at its worst? daytime Sleep (in general) NA  Pain is worse with: walking, bending, sitting, standing, and some activites Pain improves with: rest and pacing activities Relief from Meds: 7  Family History  Problem Relation Age of Onset   Stroke Mother    Heart disease Father    Lung cancer Sister    Lung cancer Brother    Healthy Daughter    Healthy Daughter    Healthy Daughter    Colon cancer Neg Hx    Esophageal cancer Neg Hx    Pancreatic cancer Neg Hx    Stomach cancer Neg Hx    Social History   Socioeconomic History   Marital status: Married    Spouse name: Not on file   Number of children: 3   Years of education: Not on file   Highest education level: Not on file  Occupational History   Not on file  Tobacco Use   Smoking status: Former    Current packs/day: 0.00    Average packs/day: 0.3 packs/day for 3.0 years (0.8 ttl pk-yrs)    Types: Cigarettes    Start date: 79    Quit date: 1970    Years since quitting: 56.0    Passive exposure: Never   Smokeless tobacco: Never   Tobacco comments:    1970's  Vaping Use   Vaping status: Never Used  Substance and Sexual Activity   Alcohol use: Yes    Comment: occ   Drug use: No    Sexual activity: Yes  Other Topics Concern   Not on file  Social History Narrative   Not on file   Social Drivers of Health   Tobacco Use: Medium Risk (03/10/2024)   Patient History    Smoking Tobacco Use: Former    Smokeless Tobacco Use: Never    Passive Exposure: Never  Physicist, Medical Strain: Low Risk (08/15/2022)   Overall Financial Resource Strain (CARDIA)    Difficulty of Paying Living Expenses: Not hard at all  Food Insecurity: No Food Insecurity (05/14/2023)   Hunger Vital Sign    Worried About Running Out of Food in the Last Year: Never true    Ran Out of Food in the Last Year: Never true  Transportation Needs: No Transportation Needs (05/14/2023)   PRAPARE - Administrator, Civil Service (Medical): No    Lack of Transportation (Non-Medical): No  Physical Activity: Inactive (08/15/2022)   Exercise Vital Sign    Days of Exercise per Week: 0 days    Minutes of Exercise per Session: 0 min  Stress: No Stress Concern Present (08/15/2022)   Pamela [name] [employer] -  Occupational Stress Questionnaire    Feeling of Stress : Not at all  Social Connections: Socially Integrated (05/12/2023)   Social Connection and Isolation Panel    Frequency of Communication with Friends and Family: More than three times a week    Frequency of Social Gatherings with Friends and Family: More than three times a week    Attends Religious Services: More than 4 times per year    Active Member of Clubs or Organizations: Yes    Attends Banker Meetings: More than 4 times per year    Marital Status: Married  Depression (PHQ2-9): Low Risk (01/09/2024)   Depression (PHQ2-9)    PHQ-2 Score: 2  Alcohol Screen: Low Risk (08/15/2022)   Alcohol Screen    Last Alcohol Screening Score (AUDIT): 0  Housing: Unknown (05/14/2023)   Housing Stability Vital Sign    Unable to Pay for Housing in the Last Year: No    Number of Times Moved in the Last Year: Not on file    Homeless in  the Last Year: No  Utilities: Not At Risk (05/14/2023)   A[employer] Utilities    Threatened with loss of utilities: No  Health Literacy: Not on file   Past Surgical History:  Procedure Laterality Date   CERVICAL DISC SURGERY     x 3   CONVERSION TO TOTAL KNEE Left 10/09/2016   Procedure: Conversion left uni compartment arthroplasty to total knee arthroplasty;  Surgeon: Pamela Cough, MD;  Location: [facility];  Service: Orthopedics;  Laterality: Left;  90 mins   KNEE ARTHROSCOPY     partial   TOOTH EXTRACTION  2024   x2   total left knee arthoplasty     10/09/16 Dr. ERNIE   Past Surgical History:  Procedure Laterality Date   CERVICAL DISC SURGERY     x 3   CONVERSION TO TOTAL KNEE Left 10/09/2016   Procedure: Conversion left uni compartment arthroplasty to total knee arthroplasty;  Surgeon: Pamela Cough, MD;  Location: [facility];  Service: Orthopedics;  Laterality: Left;  90 mins   KNEE ARTHROSCOPY     partial   TOOTH EXTRACTION  2024   x2   total left knee arthoplasty     10/09/16 Dr. ERNIE   Past Medical History:  Diagnosis Date   ALLERGIC RHINITIS 12/18/2006   Qualifier: Diagnosis of  By: Pamela [name]     Anxiety    Arthritis    DEPRESSION, SITUATIONAL 03/15/2007   Qualifier: Diagnosis of  By: Pamela [name]    GERD (gastroesophageal reflux disease)    Headache    occasional   History of kidney stones    HYPERLIPIDEMIA 12/18/2006   Qualifier: History of  By: Pamela [name]     Hypertension    HYPERTENSION 12/18/2006   Qualifier: Diagnosis of  By: Pamela [name]     INSOMNIA-SLEEP DISORDER-UNSPEC 08/25/2009   Qualifier: Diagnosis of  By: Pamela [name]    Pain in knee region after total knee replacement 04/03/2012   Pneumonia    RENAL CALCULUS, HX OF 12/18/2006   Qualifier: Diagnosis of  By: Pamela [name]     BP 109/73   Pulse 93   Ht 5' 9 (1.753 m)   SpO2 94%   BMI 31.72 kg/m   Opioid Risk Score:   Fall Risk Score:  `1  Depression screen  [facility] 2/9     03/10/2024   11:10 AM 01/09/2024   11:03 AM  11/07/2023   11:07 AM 09/13/2023    9:47 AM 08/17/2023    9:16 AM 07/02/2023   10:41 AM 06/28/2023   10:15 AM  Depression screen PHQ 2/9  Decreased Interest 3 1 1  1 1  0  Down, Depressed, Hopeless 3 1 1  1 1  0  PHQ - 2 Score 6 2 2  2 2  0  Altered sleeping    1     Tired, decreased energy    1       Review of Systems  Musculoskeletal:  Positive for back pain.       Bilateral knee  All other systems reviewed and are negative.      Objective:   Physical Exam Vitals and nursing note reviewed.  Constitutional:      Appearance: Normal appearance.  Cardiovascular:     Rate and Rhythm: Normal rate and regular rhythm.     Pulses: Normal pulses.     Heart sounds: Normal heart sounds. No murmur heard. Pulmonary:     Effort: Pulmonary effort is normal.     Breath sounds: Normal breath sounds.  Musculoskeletal:     Comments: Normal Muscle Bulk and Muscle Testing Reveals:  Upper Extremities: Full ROM and Muscle Strength 5/5  Lumbar Paraspinal Tenderness: L-4-L-5 Bilateral Lower extremities: Full ROM and Muscle Strength 5/5 Bilateral Lower extremities Flexion Produces Pain into her Bilateral Lower extremities Arises from Table slowly Narrow Based Gait     Skin:    General: Skin is warm and dry.  Neurological:     Mental Status: She is alert and oriented to person, place, and time.  Psychiatric:        Mood and Affect: Mood normal.        Behavior: Behavior normal.          Assessment & Plan:  Chronic Bilateral Low Back Pain without Sciatica: Continue HEP as tolerated. Continue to Monitor. 03/10/2024 Chronic Bilateral Knee Pain: Continue HEP as Tolerated. Continue to Monitor. 03/10/2024 Neuropathy: Continue Gabapentin . Continue to Monitor. 03/10/2024 4. Fall at Home: No Falls this month. Educated on [employer], she was instructed to use her walker at all times. She verbalizes understanding. 03/10/2024 5. Chronic  Pain Syndrome: Ms. Screws reports she is using  her oxycodone  3- 4 times a day as needed for pain. Refilled: Oxycodone  7.5/325 mg one tablet 4 times a day as needed or pain #100. Second script send for the following month.  We will continue the opioid monitoring program, this consists of regular clinic visits, examinations, urine drug screen, pill counts as well as use of [place]  Controlled Substance Reporting system. A 12 month History has been reviewed on the [place]  Controlled Substance Reporting System on 03/10/2024    F/U in 1 month  "

## 2024-03-27 ENCOUNTER — Other Ambulatory Visit: Payer: Self-pay | Admitting: Physician Assistant

## 2024-04-07 ENCOUNTER — Other Ambulatory Visit: Payer: Self-pay | Admitting: Physician Assistant

## 2024-04-10 ENCOUNTER — Other Ambulatory Visit: Payer: Self-pay | Admitting: Nurse Practitioner

## 2024-04-10 ENCOUNTER — Other Ambulatory Visit: Payer: Self-pay | Admitting: Internal Medicine

## 2024-04-10 DIAGNOSIS — J020 Streptococcal pharyngitis: Secondary | ICD-10-CM

## 2024-04-10 DIAGNOSIS — B370 Candidal stomatitis: Secondary | ICD-10-CM

## 2024-04-13 ENCOUNTER — Other Ambulatory Visit: Payer: Self-pay | Admitting: Internal Medicine

## 2024-04-14 ENCOUNTER — Other Ambulatory Visit: Payer: Self-pay

## 2024-05-06 ENCOUNTER — Encounter: Attending: Physical Medicine & Rehabilitation | Admitting: Registered Nurse
